# Patient Record
Sex: Female | Born: 1955
Health system: Southern US, Community
[De-identification: ages and names within clinical notes are randomized; demographics above are authoritative.]

## PROBLEM LIST (undated history)

## (undated) DIAGNOSIS — Z9889 Other specified postprocedural states: Secondary | ICD-10-CM

## (undated) DIAGNOSIS — M1990 Unspecified osteoarthritis, unspecified site: Secondary | ICD-10-CM

## (undated) DIAGNOSIS — Z87442 Personal history of urinary calculi: Secondary | ICD-10-CM

## (undated) DIAGNOSIS — C801 Malignant (primary) neoplasm, unspecified: Secondary | ICD-10-CM

## (undated) DIAGNOSIS — J449 Chronic obstructive pulmonary disease, unspecified: Secondary | ICD-10-CM

## (undated) DIAGNOSIS — I2510 Atherosclerotic heart disease of native coronary artery without angina pectoris: Secondary | ICD-10-CM

## (undated) DIAGNOSIS — I700 Atherosclerosis of aorta: Secondary | ICD-10-CM

## (undated) DIAGNOSIS — K529 Noninfective gastroenteritis and colitis, unspecified: Secondary | ICD-10-CM

## (undated) DIAGNOSIS — F329 Major depressive disorder, single episode, unspecified: Secondary | ICD-10-CM

## (undated) DIAGNOSIS — C439 Malignant melanoma of skin, unspecified: Secondary | ICD-10-CM

## (undated) DIAGNOSIS — G5792 Unspecified mononeuropathy of left lower limb: Secondary | ICD-10-CM

## (undated) DIAGNOSIS — I5189 Other ill-defined heart diseases: Secondary | ICD-10-CM

## (undated) DIAGNOSIS — K219 Gastro-esophageal reflux disease without esophagitis: Secondary | ICD-10-CM

## (undated) DIAGNOSIS — M5030 Other cervical disc degeneration, unspecified cervical region: Secondary | ICD-10-CM

## (undated) DIAGNOSIS — C319 Malignant neoplasm of accessory sinus, unspecified: Secondary | ICD-10-CM

## (undated) DIAGNOSIS — F32A Depression, unspecified: Secondary | ICD-10-CM

## (undated) DIAGNOSIS — Z7902 Long term (current) use of antithrombotics/antiplatelets: Secondary | ICD-10-CM

## (undated) DIAGNOSIS — I779 Disorder of arteries and arterioles, unspecified: Secondary | ICD-10-CM

## (undated) DIAGNOSIS — R112 Nausea with vomiting, unspecified: Secondary | ICD-10-CM

## (undated) DIAGNOSIS — K635 Polyp of colon: Secondary | ICD-10-CM

## (undated) DIAGNOSIS — Q2112 Patent foramen ovale: Secondary | ICD-10-CM

## (undated) DIAGNOSIS — G709 Myoneural disorder, unspecified: Secondary | ICD-10-CM

## (undated) DIAGNOSIS — M5136 Other intervertebral disc degeneration, lumbar region: Secondary | ICD-10-CM

## (undated) DIAGNOSIS — J189 Pneumonia, unspecified organism: Secondary | ICD-10-CM

## (undated) DIAGNOSIS — E785 Hyperlipidemia, unspecified: Secondary | ICD-10-CM

## (undated) DIAGNOSIS — C300 Malignant neoplasm of nasal cavity: Secondary | ICD-10-CM

## (undated) DIAGNOSIS — N200 Calculus of kidney: Secondary | ICD-10-CM

## (undated) DIAGNOSIS — I10 Essential (primary) hypertension: Secondary | ICD-10-CM

## (undated) DIAGNOSIS — K5790 Diverticulosis of intestine, part unspecified, without perforation or abscess without bleeding: Secondary | ICD-10-CM

## (undated) DIAGNOSIS — Z720 Tobacco use: Secondary | ICD-10-CM

## (undated) DIAGNOSIS — G629 Polyneuropathy, unspecified: Secondary | ICD-10-CM

## (undated) DIAGNOSIS — K589 Irritable bowel syndrome without diarrhea: Secondary | ICD-10-CM

## (undated) DIAGNOSIS — M47812 Spondylosis without myelopathy or radiculopathy, cervical region: Secondary | ICD-10-CM

## (undated) DIAGNOSIS — M4316 Spondylolisthesis, lumbar region: Secondary | ICD-10-CM

## (undated) DIAGNOSIS — G44009 Cluster headache syndrome, unspecified, not intractable: Secondary | ICD-10-CM

## (undated) DIAGNOSIS — M51369 Other intervertebral disc degeneration, lumbar region without mention of lumbar back pain or lower extremity pain: Secondary | ICD-10-CM

## (undated) DIAGNOSIS — R011 Cardiac murmur, unspecified: Secondary | ICD-10-CM

## (undated) HISTORY — PX: BACK SURGERY: SHX140

## (undated) HISTORY — PX: CATARACT EXTRACTION: SUR2

## (undated) HISTORY — DX: Unspecified osteoarthritis, unspecified site: M19.90

## (undated) HISTORY — DX: Irritable bowel syndrome, unspecified: K58.9

## (undated) HISTORY — PX: DILATION AND CURETTAGE OF UTERUS: SHX78

## (undated) HISTORY — PX: COLONOSCOPY: SHX174

## (undated) HISTORY — DX: Diverticulosis of intestine, part unspecified, without perforation or abscess without bleeding: K57.90

## (undated) HISTORY — PX: EYE SURGERY: SHX253

## (undated) HISTORY — DX: Tobacco use: Z72.0

## (undated) HISTORY — DX: Spondylosis without myelopathy or radiculopathy, cervical region: M47.812

## (undated) HISTORY — DX: Malignant neoplasm of nasal cavity: C30.0

## (undated) HISTORY — DX: Cluster headache syndrome, unspecified, not intractable: G44.009

## (undated) HISTORY — DX: Calculus of kidney: N20.0

## (undated) HISTORY — PX: OOPHORECTOMY: SHX86

## (undated) HISTORY — DX: Polyp of colon: K63.5

## (undated) HISTORY — DX: Essential (primary) hypertension: I10

## (undated) HISTORY — DX: Hyperlipidemia, unspecified: E78.5

## (undated) HISTORY — PX: CYSTOSCOPY: SUR368

## (undated) HISTORY — DX: Malignant neoplasm of accessory sinus, unspecified: C31.9

---
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
## 1993-10-26 DIAGNOSIS — C439 Malignant melanoma of skin, unspecified: Secondary | ICD-10-CM

## 1993-10-26 HISTORY — DX: Malignant melanoma of skin, unspecified: C43.9

## 1996-10-26 HISTORY — PX: ABDOMINAL HYSTERECTOMY: SHX81

## 1996-10-26 HISTORY — PX: APPENDECTOMY: SHX54

## 1999-10-27 HISTORY — PX: FRACTURE SURGERY: SHX138

## 1999-10-27 HISTORY — PX: OTHER SURGICAL HISTORY: SHX169

## 2000-05-02 ENCOUNTER — Encounter: Payer: Self-pay | Admitting: Emergency Medicine

## 2000-05-02 ENCOUNTER — Emergency Department (HOSPITAL_COMMUNITY): Admission: EM | Admit: 2000-05-02 | Discharge: 2000-05-02 | Payer: Self-pay | Admitting: Emergency Medicine

## 2000-05-03 ENCOUNTER — Encounter: Payer: Self-pay | Admitting: Emergency Medicine

## 2004-06-19 ENCOUNTER — Encounter: Admission: RE | Admit: 2004-06-19 | Discharge: 2004-06-19 | Payer: Self-pay | Admitting: Neurosurgery

## 2004-07-07 ENCOUNTER — Encounter: Admission: RE | Admit: 2004-07-07 | Discharge: 2004-07-07 | Payer: Self-pay | Admitting: Neurosurgery

## 2004-07-26 HISTORY — PX: LUMBAR SPINE SURGERY: SHX701

## 2004-08-25 ENCOUNTER — Ambulatory Visit (HOSPITAL_COMMUNITY): Admission: RE | Admit: 2004-08-25 | Discharge: 2004-08-25 | Payer: Self-pay | Admitting: Neurosurgery

## 2006-01-27 ENCOUNTER — Encounter: Admission: RE | Admit: 2006-01-27 | Discharge: 2006-01-27 | Payer: Self-pay | Admitting: Neurosurgery

## 2007-02-04 ENCOUNTER — Encounter: Payer: Self-pay | Admitting: Family Medicine

## 2007-02-04 LAB — CONVERTED CEMR LAB: Pap Smear: NORMAL

## 2007-06-02 ENCOUNTER — Ambulatory Visit: Payer: Self-pay | Admitting: Unknown Physician Specialty

## 2007-08-24 ENCOUNTER — Encounter: Payer: Self-pay | Admitting: Family Medicine

## 2007-09-14 ENCOUNTER — Ambulatory Visit: Payer: Self-pay | Admitting: Family Medicine

## 2007-09-14 DIAGNOSIS — M47812 Spondylosis without myelopathy or radiculopathy, cervical region: Secondary | ICD-10-CM

## 2007-09-14 DIAGNOSIS — J309 Allergic rhinitis, unspecified: Secondary | ICD-10-CM | POA: Insufficient documentation

## 2007-09-14 DIAGNOSIS — J45909 Unspecified asthma, uncomplicated: Secondary | ICD-10-CM

## 2007-09-14 DIAGNOSIS — J4520 Mild intermittent asthma, uncomplicated: Secondary | ICD-10-CM | POA: Insufficient documentation

## 2007-11-07 ENCOUNTER — Encounter: Payer: Self-pay | Admitting: Family Medicine

## 2007-12-01 ENCOUNTER — Encounter: Payer: Self-pay | Admitting: Family Medicine

## 2007-12-29 ENCOUNTER — Encounter: Payer: Self-pay | Admitting: Family Medicine

## 2008-01-10 ENCOUNTER — Ambulatory Visit: Payer: Self-pay | Admitting: Family Medicine

## 2008-01-10 DIAGNOSIS — F17200 Nicotine dependence, unspecified, uncomplicated: Secondary | ICD-10-CM | POA: Insufficient documentation

## 2008-07-05 ENCOUNTER — Encounter: Payer: Self-pay | Admitting: Family Medicine

## 2008-08-27 ENCOUNTER — Ambulatory Visit: Payer: Self-pay | Admitting: Family Medicine

## 2008-08-27 ENCOUNTER — Telehealth: Payer: Self-pay | Admitting: Family Medicine

## 2008-08-31 ENCOUNTER — Telehealth: Payer: Self-pay | Admitting: Family Medicine

## 2008-09-14 ENCOUNTER — Ambulatory Visit: Payer: Self-pay | Admitting: Family Medicine

## 2008-09-14 LAB — CONVERTED CEMR LAB
Glucose, Urine, Semiquant: NEGATIVE
Nitrite: NEGATIVE
Protein, U semiquant: NEGATIVE
WBC Urine, dipstick: NEGATIVE

## 2008-09-15 ENCOUNTER — Encounter: Payer: Self-pay | Admitting: Family Medicine

## 2008-10-26 HISTORY — PX: CERVICAL FUSION: SHX112

## 2008-12-21 ENCOUNTER — Encounter: Payer: Self-pay | Admitting: Family Medicine

## 2009-02-12 ENCOUNTER — Ambulatory Visit: Payer: Self-pay | Admitting: Family Medicine

## 2009-02-12 DIAGNOSIS — R030 Elevated blood-pressure reading, without diagnosis of hypertension: Secondary | ICD-10-CM

## 2009-02-12 DIAGNOSIS — L259 Unspecified contact dermatitis, unspecified cause: Secondary | ICD-10-CM

## 2009-03-04 ENCOUNTER — Encounter: Payer: Self-pay | Admitting: Family Medicine

## 2009-03-05 ENCOUNTER — Ambulatory Visit (HOSPITAL_COMMUNITY): Admission: RE | Admit: 2009-03-05 | Discharge: 2009-03-05 | Payer: Self-pay | Admitting: Neurosurgery

## 2009-03-08 ENCOUNTER — Ambulatory Visit: Payer: Self-pay | Admitting: Family Medicine

## 2009-03-12 ENCOUNTER — Ambulatory Visit: Payer: Self-pay | Admitting: Family Medicine

## 2009-03-13 ENCOUNTER — Encounter (INDEPENDENT_AMBULATORY_CARE_PROVIDER_SITE_OTHER): Payer: Self-pay | Admitting: Internal Medicine

## 2009-03-18 ENCOUNTER — Encounter: Payer: Self-pay | Admitting: Family Medicine

## 2009-03-22 ENCOUNTER — Encounter: Payer: Self-pay | Admitting: Family Medicine

## 2009-04-09 ENCOUNTER — Inpatient Hospital Stay (HOSPITAL_COMMUNITY): Admission: RE | Admit: 2009-04-09 | Discharge: 2009-04-10 | Payer: Self-pay | Admitting: Neurosurgery

## 2009-05-09 ENCOUNTER — Encounter: Payer: Self-pay | Admitting: Family Medicine

## 2009-06-17 ENCOUNTER — Encounter: Payer: Self-pay | Admitting: Family Medicine

## 2009-10-03 ENCOUNTER — Encounter: Payer: Self-pay | Admitting: Family Medicine

## 2009-12-29 LAB — HM PAP SMEAR: HM Pap smear: NORMAL

## 2010-10-26 HISTORY — PX: ESOPHAGOGASTRODUODENOSCOPY: SHX1529

## 2010-11-16 ENCOUNTER — Encounter: Payer: Self-pay | Admitting: Neurosurgery

## 2010-11-25 NOTE — Letter (Signed)
Summary: [facility]  [facility]   Imported By: Lanelle Bal 11/11/2009 09:04:34  _____________________________________________________________________  External Attachment:    Type:   Image     Comment:   External Document

## 2011-02-02 LAB — BASIC METABOLIC PANEL
Calcium: 9.6 mg/dL (ref 8.4–10.5)
Chloride: 106 mEq/L (ref 96–112)
Creatinine, Ser: 0.78 mg/dL (ref 0.4–1.2)
GFR calc non Af Amer: >60 mL/min (ref >60)
Potassium: 4.5 mEq/L (ref 3.5–5.1)
Sodium: 141 mEq/L (ref 135–145)

## 2011-02-02 LAB — CBC
Hemoglobin: 14.6 g/dL (ref 12.0–15.0)
MCHC: 33.5 g/dL (ref 30.0–36.0)
RBC: 4.62 MIL/uL (ref 3.87–5.11)
WBC: 7.5 10*3/uL (ref 4.0–10.5)

## 2011-03-10 NOTE — H&P (Signed)
NAME:  Heather Beasley, BLACKSON NO.:  [id number]   MEDICAL RECORD NO.:  [id number]          PATIENT TYPE:  INP   LOCATION:  3007                         FACILITY:  [facility]   PHYSICIAN:  Hilda Lias, M.D.   DATE OF BIRTH:  05/09/1956   DATE OF ADMISSION:  04/09/2009  DATE OF DISCHARGE:                              HISTORY & PHYSICAL   Heather Beasley is a lady who had been seen by me in my office for almost a  year complaining of neck pain with radiation to the upper extremity  which is not any better despite conservative treatment including  epidural injection.  X-rays were obtained.  She has had severe stenosis  from C3-4 down to C1-C6.  The patient came to see me on several  occasions and still she she felt that she is not any better and she want  to proceed with surgery.   PAST MEDICAL HISTORY:  Lumbar diskectomy 5 years ago.   She is allergic to ERYTHROMYCIN, AMOXICILLIN, AUGMENTIN, also to LIPIDS.   FAMILY HISTORY:  Positive for diabetes, high blood pressure.  Father  died of cancer of the pancreas.   SOCIAL HISTORY:  The patient does not drink.  She smokes a pack a day.   REVIEW OF SYSTEMS:  Positive for neck pain, back pain.   PHYSICAL EXAMINATION:  HEAD, EYE, EARS, NOSE, AND THROAT:  Normal.  NECK:  She has had decreased flexibility of the lumbar spine.  LUNGS:  Mild rhonchi bilaterally.  ABDOMEN:  Normal.  EXTREMITIES:  Normal pulses.  CARDIOVASCULAR:  Normal.  NEUROLOGIC:  She has weakness of the deltoid and biceps.  There has also  weakness of the left foot.  Reflexes 1+.  No Babinski.   Cervical spine x-rays showed that she has severe stenosis at the level  of 3-4, 4-5, 5-6.  Also, she has recurrent herniated disk at L4-5  lumbar.   IMPRESSION:  Cervical stenosis C3-4, 4-5, 5-6.  Recurrent herniated disk  in the lumbar area.  Rule out ulnar neuropathy.  My recommendation is  that the patient be admitted for surgery, and the procedure will be a 2-  level  anterior cervical diskectomy.  She knows about the risks of  infection, CSF leak, worsening pain, need for further surgery.  She is  fully aware that this type of surgery will not correct her recurrent  lumbar disease.            ______________________________  Hilda Lias, M.D.    EB/MEDQ  D:  04/09/2009  T:  04/10/2009  Job:  [id number]

## 2011-03-10 NOTE — Op Note (Signed)
NAME:  Heather Beasley, Heather Beasley               ACCOUNT NO.:  [id number]   MEDICAL RECORD NO.:  [id number]          PATIENT TYPE:  INP   LOCATION:  3007                         FACILITY:  [facility]   PHYSICIAN:  Hilda Lias, M.D.   DATE OF BIRTH:  June 18, 1956   DATE OF PROCEDURE:  04/09/2009  DATE OF DISCHARGE:                               OPERATIVE REPORT   PREOPERATIVE DIAGNOSIS:  C3-4, C4-5, and C5-6 spondylosis with chronic  radiculopathy.   POSTOPERATIVE DIAGNOSIS:  C3-4, C4-5, and C5-6 spondylosis with chronic  radiculopathy.   PROCEDURE:  T3-4, T4-5, and T5-6 diskectomy, decompression of spinal  cord, interbody fusion with auto and allograft plate, microscope.   SURGEON:  Hilda Lias, MD   ASSISTANT:  Cristi Loron, MD   CLINICAL HISTORY:  The patient was admitted because of neck pain  worsening at both upper extremity.  She had failed with conservative  treatment.  She had previous surgery by another neurosurgeon in the  lumbar area.  She is still complaining of back pain.  Surgery was  advised in the cervical area.  She was fully aware that this will not  take care of her lumbar problem.   PROCEDURE:  The patient was taken to the OR after intubation.  The neck  was cleaned with Hibiclens.  The patient is allergic to IODINE and  LATEX.  Then, a transverse incision was carried down through the skin,  subcutaneous tissue, and platysma down to cervical area.  X-ray showed  that indeed we were at the level of C4-5.  Then, anterior osteophyte was  removed and the anterior ligament at the level of C4-5 was opened.  With  a drill and microcurette, we did a total diskectomy.  We brought the  microscope into the area.  The posterior ligament was removed and quite  a bit of spondylosis with calcified posterior ligament was removed.  At  the end, we had good decompression of the spinal cord as well as C5  nerve root.  The same procedure with the same results was done at the  level of  C3-4 and C5-6 with good decompression bilaterally of the C4 and  C6 nerve root.  The patient had quite a bit of venous bleeding at the  level of C3-4 on the right side.  This was taken care with the Gelfoam.  Having good decompression, having good exposure of the spinal cord and  the nerve root, 3 pieces of bone graft, 7 mm height, with autograft and  BMX inside were inserted.  This was followed by a plate using 8 screws.  Lateral cervical spine showed good position with the plate and the bone  graft.  Although, we achieved hemostasis, nevertheless because she was a  little bit osteoporotic, we left the drain in the upper cervical area.  Then, the wound was closed with Vicryl and Steri-Strips.           ______________________________  Hilda Lias, M.D.     EB/MEDQ  D:  04/09/2009  T:  04/10/2009  Job:  [id number]

## 2011-03-13 NOTE — Op Note (Signed)
NAME:  Heather Beasley, Heather Beasley               ACCOUNT NO.:  [id number]   MEDICAL RECORD NO.:  [id number]          PATIENT TYPE:  OIB   LOCATION:  2899                         FACILITY:  [facility]   PHYSICIAN:  Kathaleen Maser. Pool, M.D.    DATE OF BIRTH:  1955/12/24   DATE OF PROCEDURE:  08/25/2004  DATE OF DISCHARGE:                                 OPERATIVE REPORT   PREOPERATIVE DIAGNOSIS:  Left L4-5 stenosis with L4-5 herniated nucleus  pulposus.   POSTOPERATIVE DIAGNOSIS:  Left L4-5 stenosis with L4-5 herniated nucleus  pulposus.   PROCEDURE:  Left L4-5 decompressive lumbar laminotomy with foraminotomy and  microdiskectomy.   SURGEON:  Kathaleen Maser. Pool, M.D.   ASSISTANT:  Reinaldo Meeker, M.D.   ANESTHESIA:  General endotracheal anesthesia.   INDICATIONS FOR PROCEDURE:  The patient is a 55 year old female with a  history of back and left lower extremity pain consistent with a left sided  L5 radiculopathy.  She has failed conservative management.  Workup has  demonstrated evidence of significant left sided lateral recess stenosis with  a superimposed disk herniation causing compression of the left sided L5  nerve root.  The patient has been counseled as to her options.  She desires  to proceed with a left sided L4-5 laminotomy and microdiskectomy in hopes of  improving her symptoms.   DESCRIPTION OF PROCEDURE:  The patient was brought to the operating room and  placed on the operating table in a supine position. After an adequate level  of anesthesia was achieved the patient was positioned prone onto a Wilson  frame and appropriately padded.  The region of the lumbar region was prepped  and draped sterilely.  A 10 blade was used to make a linear skin incision  overlying the L4-5 interspace.  This was carried down sharply in the  midline.  Subperiosteal dissection was performed which included the lamina  and facet joints of L4 and L5 on the left and deep self retaining retractor  was placed.   Intraoperative x-rays were taken and the level was confirmed.  Laminotomy was then performed using a high speed drill and Kerrison rongeurs  removing the inferior aspect of the lamina of L4 and the medial aspect of  the L5 pedicle and facet joint and the superior rim of the L5 lamina.  The  ligamentum flavum was then elevated and resected in the usual fashion using  Kerrison rongeurs freeing up the thecal sac and exiting the L5 nerve root  which were identified.  The microscope was brought onto the field and we  performed the microdissection of the left sided L5 nerve root and underlying  disk herniation. A wide foraminotomy was performed coursing the exiting L5  nerve root.  The thecal sac and L5 nerve root were then mobilized and  tracked towards the midline.  The epineural venous plexus was coagulated and  cut.  The disk herniation was readily apparent.  This was then incised with  a 15 blade in a rectangular fashion.  A wide disk space was then achieved  using pituitary rongeurs, __________ handle pituitary  rongeurs and Epstein  curettes.  All loose degenerative disk material was removed from the  interspace. All disk herniation was completely resected.  There was no  further compression on the thecal sac or nerve roots.  There was no evidence  of injury to the thecal sac or nerve roots.  The wound was then irrigated  with antibiotic flush.  Gelfoam was placed topically in the incision and  hemostasis was found to be good.  The operating microscope and retractors  were then removed.  Hemostasis was assured with  electrocautery.  The wounds were closed in layers with Vicryl sutures.  Steri-Strips and sterile dressing were applied.  There were no  complications.  The patient tolerated the procedure well and returns to the  recovery room postoperatively.       HAP/MEDQ  D:  08/25/2004  T:  08/25/2004  Job:  [id number]

## 2011-04-06 ENCOUNTER — Encounter: Payer: Self-pay | Admitting: Gastroenterology

## 2011-05-07 ENCOUNTER — Other Ambulatory Visit: Payer: Self-pay | Admitting: Neurosurgery

## 2011-05-07 DIAGNOSIS — M549 Dorsalgia, unspecified: Secondary | ICD-10-CM

## 2011-05-13 ENCOUNTER — Ambulatory Visit
Admission: RE | Admit: 2011-05-13 | Discharge: 2011-05-13 | Disposition: A | Discharge status: Home / Self Care | Payer: Self-pay | Source: Ambulatory Visit | Attending: Neurosurgery | Admitting: Neurosurgery

## 2011-05-13 VITALS — BP 89/49 | HR 62

## 2011-05-13 DIAGNOSIS — M549 Dorsalgia, unspecified: Secondary | ICD-10-CM

## 2011-05-13 MED ORDER — ONDANSETRON HCL 4 MG/2ML IJ SOLN
4.0000 mg | Freq: Once | INTRAMUSCULAR | Status: AC
  Administered 2011-05-13: 4 mg via INTRAMUSCULAR

## 2011-05-13 MED ORDER — DIAZEPAM 2 MG PO TABS
10.0000 mg | ORAL_TABLET | Freq: Once | ORAL | Status: AC
  Administered 2011-05-13: 10 mg via ORAL

## 2011-05-13 MED ORDER — DEXTROSE-NACL 5-0.45 % IV SOLN: INTRAVENOUS | Status: DC

## 2011-05-13 MED ORDER — MEPERIDINE HCL 100 MG/ML IJ SOLN
75.0000 mg | Freq: Once | INTRAMUSCULAR | Status: AC
  Administered 2011-05-13: 75 mg via INTRAMUSCULAR

## 2011-05-13 MED ORDER — IOHEXOL 180 MG/ML  SOLN
15.0000 mL | Freq: Once | INTRAMUSCULAR | Status: AC | PRN
  Administered 2011-05-13: 15 mL via INTRATHECAL

## 2011-05-13 NOTE — Progress Notes (Signed)
Pt in nursing station resting quietly w/o complaint after myelogram.  jkl

## 2011-05-28 ENCOUNTER — Ambulatory Visit: Payer: Self-pay | Admitting: Gastroenterology

## 2011-05-29 ENCOUNTER — Encounter (HOSPITAL_COMMUNITY)
Admission: RE | Admit: 2011-05-29 | Discharge: 2011-05-29 | Disposition: A | Discharge status: Home / Self Care | Payer: Medicare PPO | Source: Ambulatory Visit | Attending: Neurosurgery | Admitting: Neurosurgery

## 2011-05-29 LAB — BASIC METABOLIC PANEL
BUN: 10 mg/dL (ref 6–23)
Calcium: 9.7 mg/dL (ref 8.4–10.5)
GFR calc non Af Amer: >60 mL/min (ref >60)
Glucose, Bld: 84 mg/dL (ref 70–99)
Sodium: 141 mEq/L (ref 135–145)

## 2011-05-29 LAB — CBC
Hemoglobin: 14.9 g/dL (ref 12.0–15.0)
MCH: 31.2 pg (ref 26.0–34.0)
MCHC: 33.2 g/dL (ref 30.0–36.0)

## 2011-05-29 LAB — SURGICAL PCR SCREEN: MRSA, PCR: NEGATIVE

## 2011-06-09 ENCOUNTER — Inpatient Hospital Stay (HOSPITAL_COMMUNITY)
Admission: RE | Admit: 2011-06-09 | Discharge: 2011-06-14 | DRG: 460 | Disposition: A | Discharge status: Home / Self Care | Payer: Medicare PPO | Source: Ambulatory Visit | Attending: Neurosurgery | Admitting: Neurosurgery

## 2011-06-09 ENCOUNTER — Inpatient Hospital Stay (HOSPITAL_COMMUNITY): Payer: Medicare PPO

## 2011-06-09 ENCOUNTER — Other Ambulatory Visit (HOSPITAL_COMMUNITY): Payer: Self-pay | Admitting: Neurosurgery

## 2011-06-09 ENCOUNTER — Ambulatory Visit (HOSPITAL_COMMUNITY)
Admission: RE | Admit: 2011-06-09 | Discharge: 2011-06-09 | Disposition: A | Discharge status: Home / Self Care | Payer: Medicare PPO | Source: Ambulatory Visit | Attending: Neurosurgery | Admitting: Neurosurgery

## 2011-06-09 DIAGNOSIS — Z01811 Encounter for preprocedural respiratory examination: Secondary | ICD-10-CM

## 2011-06-09 DIAGNOSIS — J4489 Other specified chronic obstructive pulmonary disease: Secondary | ICD-10-CM | POA: Diagnosis present

## 2011-06-09 DIAGNOSIS — M5137 Other intervertebral disc degeneration, lumbosacral region: Principal | ICD-10-CM | POA: Diagnosis present

## 2011-06-09 DIAGNOSIS — J449 Chronic obstructive pulmonary disease, unspecified: Secondary | ICD-10-CM | POA: Diagnosis present

## 2011-06-09 DIAGNOSIS — Z91040 Latex allergy status: Secondary | ICD-10-CM

## 2011-06-09 DIAGNOSIS — F17200 Nicotine dependence, unspecified, uncomplicated: Secondary | ICD-10-CM | POA: Diagnosis present

## 2011-06-09 DIAGNOSIS — Z888 Allergy status to other drugs, medicaments and biological substances status: Secondary | ICD-10-CM

## 2011-06-09 DIAGNOSIS — M51379 Other intervertebral disc degeneration, lumbosacral region without mention of lumbar back pain or lower extremity pain: Principal | ICD-10-CM | POA: Diagnosis present

## 2011-06-09 DIAGNOSIS — M4310 Spondylolisthesis, site unspecified: Secondary | ICD-10-CM | POA: Diagnosis present

## 2011-06-09 DIAGNOSIS — K219 Gastro-esophageal reflux disease without esophagitis: Secondary | ICD-10-CM | POA: Diagnosis present

## 2011-06-09 LAB — CBC
HCT: 29.8 % — ABNORMAL LOW (ref 36.0–46.0)
MCHC: 32.6 g/dL (ref 30.0–36.0)
MCV: 95.8 fL (ref 78.0–100.0)
RDW: 13.3 % (ref 11.5–15.5)

## 2011-06-09 LAB — DIFFERENTIAL
Eosinophils Relative: 0 % (ref 0–5)
Lymphocytes Relative: 4 % — ABNORMAL LOW (ref 12–46)
Lymphs Abs: 0.4 10*3/uL — ABNORMAL LOW (ref 0.7–4.0)
Monocytes Absolute: 0.4 10*3/uL (ref 0.1–1.0)

## 2011-06-10 ENCOUNTER — Inpatient Hospital Stay (HOSPITAL_COMMUNITY): Payer: Medicare PPO

## 2011-06-10 DIAGNOSIS — R072 Precordial pain: Secondary | ICD-10-CM

## 2011-06-10 DIAGNOSIS — I959 Hypotension, unspecified: Secondary | ICD-10-CM

## 2011-06-10 DIAGNOSIS — D62 Acute posthemorrhagic anemia: Secondary | ICD-10-CM

## 2011-06-10 LAB — CBC
HCT: 24.9 % — ABNORMAL LOW (ref 36.0–46.0)
MCH: 31.2 pg (ref 26.0–34.0)
MCH: 31.7 pg (ref 26.0–34.0)
MCV: 95.8 fL (ref 78.0–100.0)
MCV: 94.4 fL (ref 78.0–100.0)
Platelets: 125 10*3/uL — ABNORMAL LOW (ref 150–400)
Platelets: 115 10*3/uL — ABNORMAL LOW (ref 150–400)
RBC: 2.6 MIL/uL — ABNORMAL LOW (ref 3.87–5.11)
RDW: 13.3 % (ref 11.5–15.5)
RDW: 14.4 % (ref 11.5–15.5)
WBC: 9.4 10*3/uL (ref 4.0–10.5)

## 2011-06-10 LAB — BASIC METABOLIC PANEL
CO2: 32 mEq/L (ref 19–32)
Calcium: 8 mg/dL — ABNORMAL LOW (ref 8.4–10.5)
Chloride: 104 mEq/L (ref 96–112)
Glucose, Bld: 112 mg/dL — ABNORMAL HIGH (ref 70–99)
Sodium: 138 mEq/L (ref 135–145)

## 2011-06-10 LAB — CARDIAC PANEL(CRET KIN+CKTOT+MB+TROPI)
CK, MB: 7.2 ng/mL (ref 0.3–4.0)
Total CK: 1419 U/L — ABNORMAL HIGH (ref 7–177)
Total CK: 1430 U/L — ABNORMAL HIGH (ref 7–177)
Troponin I: <0.3 ng/mL (ref <0.30)
Troponin I: <0.3 ng/mL (ref <0.30)

## 2011-06-10 LAB — TYPE AND SCREEN
Antibody Screen: NEGATIVE
Unit division: 0

## 2011-06-11 DIAGNOSIS — J189 Pneumonia, unspecified organism: Secondary | ICD-10-CM

## 2011-06-11 DIAGNOSIS — D62 Acute posthemorrhagic anemia: Secondary | ICD-10-CM

## 2011-06-11 DIAGNOSIS — I959 Hypotension, unspecified: Secondary | ICD-10-CM

## 2011-06-11 DIAGNOSIS — J411 Mucopurulent chronic bronchitis: Secondary | ICD-10-CM

## 2011-06-11 LAB — COMPREHENSIVE METABOLIC PANEL
BUN: 7 mg/dL (ref 6–23)
Calcium: 7.8 mg/dL — ABNORMAL LOW (ref 8.4–10.5)
GFR calc Af Amer: >60 mL/min (ref >60)
Glucose, Bld: 97 mg/dL (ref 70–99)
Total Protein: 4.4 g/dL — ABNORMAL LOW (ref 6.0–8.3)

## 2011-06-11 LAB — CBC
HCT: 27.4 % — ABNORMAL LOW (ref 36.0–46.0)
Hemoglobin: 8.9 g/dL — ABNORMAL LOW (ref 12.0–15.0)
MCH: 30.7 pg (ref 26.0–34.0)
MCHC: 32.5 g/dL (ref 30.0–36.0)

## 2011-06-11 LAB — PROCALCITONIN: Procalcitonin: 0.42 ng/mL

## 2011-06-11 LAB — CROSSMATCH: ABO/RH(D): A POS

## 2011-06-11 LAB — LACTIC ACID, PLASMA: Lactic Acid, Venous: 1 mmol/L (ref 0.5–2.2)

## 2011-06-12 ENCOUNTER — Inpatient Hospital Stay (HOSPITAL_COMMUNITY): Payer: Medicare PPO

## 2011-06-12 LAB — IRON AND TIBC

## 2011-06-12 LAB — CBC
Platelets: 125 10*3/uL — ABNORMAL LOW (ref 150–400)
RBC: 2.98 MIL/uL — ABNORMAL LOW (ref 3.87–5.11)
WBC: 8.2 10*3/uL (ref 4.0–10.5)

## 2011-06-12 LAB — BASIC METABOLIC PANEL
CO2: 30 mEq/L (ref 19–32)
Calcium: 8 mg/dL — ABNORMAL LOW (ref 8.4–10.5)
Chloride: 104 mEq/L (ref 96–112)
Sodium: 138 mEq/L (ref 135–145)

## 2011-06-12 LAB — FERRITIN: Ferritin: 330 ng/mL — ABNORMAL HIGH (ref 10–291)

## 2011-06-13 LAB — CBC
HCT: 27.5 % — ABNORMAL LOW (ref 36.0–46.0)
MCH: 30.7 pg (ref 26.0–34.0)
MCHC: 32.4 g/dL (ref 30.0–36.0)
MCV: 94.8 fL (ref 78.0–100.0)
RDW: 13.9 % (ref 11.5–15.5)

## 2011-06-13 LAB — BASIC METABOLIC PANEL
BUN: 5 mg/dL — ABNORMAL LOW (ref 6–23)
CO2: 31 mEq/L (ref 19–32)
Chloride: 105 mEq/L (ref 96–112)
Creatinine, Ser: 0.52 mg/dL (ref 0.50–1.10)
GFR calc Af Amer: >60 mL/min (ref >60)

## 2011-06-13 NOTE — H&P (Signed)
  NAME:  Heather Beasley, Heather Beasley NO.:  [id number]  MEDICAL RECORD NO.:  [id number]  LOCATION:                                 FACILITY:  PHYSICIAN:  Hilda Lias, M.D.   DATE OF BIRTH:  04/29/1956  DATE OF ADMISSION:  06/09/2011 DATE OF DISCHARGE:                             HISTORY & PHYSICAL   Heather Beasley is a lady who back in 2005 underwent bilateral laminectomy because of back pain with radiation to the left leg.  Nevertheless over the year, she continued to have more back pain.  She ended up having anterior cervical diskectomy by me.  She had been having more back pain lately.  She had been seen by the [facility].  She had conservative treatment including physical therapy and epidural injection and she is not any better.  We did the x-rays about 4 weeks ago and before the findings, she is being admitted for surgery.  PAST MEDICAL HISTORY: 1. Laminectomy in 2005. 2. Anterior cervical diskectomy.  ALLERGIES:  She is allergic to ERYTHROMYCIN, AMOXICILLIN, AUGMENTIN and now she is allergic to IODINE.  FAMILY HISTORY:  Positive for diabetes, high blood pressure.  Father died of cancer of the pancreas.  SOCIAL HISTORY:  The patient does not drink.  She smokes a pack a cigarette a day.  REVIEW OF SYSTEMS:  Positive for back pain and leg pain.  PHYSICAL EXAMINATION:  GENERAL:  The patient came to my office limping from the left leg. HEAD, EYE, EARS, NOSE, AND THROAT:  Normal. NECK:  There is a scar anteriorly. LUNGS:  There is some mild rhonchi bilaterally. CARDIOVASCULAR:  Normal. ABDOMEN:  Normal. EXTREMITIES:  No leg edema. NEUROLOGICAL:  She has a weakness and dorsiflexion in both feet, the left worse than right one.  Straight leg raising is positive bilaterally about 60 degrees.  Femoral stretch maneuver is negative bilaterally. Reflexes 1+.  No Babinski.  The lumbar myelogram showed that she has postoperative changes at the level of L4-5 with  recurrent herniated disk paracentral and to the left with bilateral foraminal narrow.  There is also grade 1 spondylolisthesis.  At the level L5-S1, there is a facet arthropathy with bilateral foraminal narrow.  CLINICAL IMPRESSION:  L4-5 spondylolisthesis, degenerative disk disease, L5-S1.  RECOMMENDATIONS:  The patient is being admitted for surgery.  Procedure will be bilateral L4-5, L5-S1 diskectomy, interbody fusion with cages and pedicle screws.  She and her family know the risk of the surgery including the possibility of pseudoarthrosis, infection, CSF leaks, no improve whatsoever, need for further surgery.          ______________________________ Hilda Lias, M.D.     EB/MEDQ  D:  06/09/2011  T:  06/09/2011  Job:  [id number]  Electronically Signed by Hilda Lias M.D. on 06/13/2011 09:44:01 AM

## 2011-06-13 NOTE — Op Note (Signed)
NAME:  Heather Beasley, PFARR NO.:  [id number]  MEDICAL RECORD NO.:  [id number]  LOCATION:  XRAY                         FACILITY:  [facility]  PHYSICIAN:  Hilda Lias, M.D.   DATE OF BIRTH:  1956/02/25  DATE OF PROCEDURE:  06/09/2011 DATE OF DISCHARGE:                              OPERATIVE REPORT   PREOPERATIVE DIAGNOSES:  Status post L5 laminectomy and diskectomy and degenerative disk disease with spondylolisthesis, L4-L5 and facet arthropathy, degenerative disk disease, L5-S1, and chronic radiculopathy.  POSTOPERATIVE DIAGNOSES:  Status post L5 laminectomy and diskectomy and degenerative disk disease with spondylolisthesis, L4-L5 and facet arthropathy, degenerative disk disease, L5-S1, and chronic radiculopathy.  PROCEDURES:  Bilateral L5 and L4 laminectomy, bilateral L4 and L5 facetectomy, bilateral L4-L5 diskectomy beyond what normally we do for routine diskectomy, lysis of adhesions, closure of opening the dura mater on the left dorsal L5 area.  Fusion with cages, two being at the level of L4-L5, one at the level of L5-S1.  Pedicle screws from L4, L5, and S1, Cross-Link posterolateral arthrodesis with Vitoss and autograft. Microscope.  SURGEON:  Hilda Lias, MD  ASSISTANT:  Stefani Dama, MD  CLINICAL HISTORY:  [name] is a 55 year old female who underwent surgery in the past by a different surgeon.  The patient at that time has diskectomy at L4-L5.  Since then, she had been complaining of back pain with radiation to both legs.  She has failed with conservative treatment.  X-rays shows spondylolisthesis at the level of L4-L5 with severe degenerative disk disease at the level of L5-S1.  Surgery was advised and the risk was fully explained to her.  PROCEDURE IN DETAIL:  The patient was taken to the OR and after intubation, the skin was cleaned with Hibiclens.  The patient allergic to iron and latex.  Drapes were applied.  Because of her size, we  were unable to feel any spinous process but the incision was made in the midline through the skin and subcutaneous tissue until we found the spinous process.  Then retraction was done laterally.  With x-ray, we were able to identify L4-L5 and L5-S1.  We proceeded with removal of spinous processes of L4 and L5.  We started first on the right side where the hemi lamina of the facet was removed.  At this area, the patient had quite a bit of adhesion and lysis was accomplished.  Then our attention was on the left side where it took [place] longer.  We had to go all the way laterally secondary to a large adhesion coming from the left side all the way down into the facet area.  At the end, we were able to remove the facet of L4 and L5.  There was 1-cm trapped in the dura mater which was taken care with 2 stitches of Prolene.  Then we started to dissect.  Dissection of the L4 and L5 and S1 nerve root on the right side was easy but the left one took [place] longer because of quite a bit of adhesion.  At the end, we were able to enter the disk space at the level of L4-L5 and first through the right approach and left  lateral approach, total diskectomy was done.  The endplates were removed and 2 cages of 10 x 22 with autograft were inserted.  At the level of L5-S1 after we did a diskectomy, we introduced one large single cage in the midline of 10 x 22.  The rest of the space was filled up with Vitoss and autograft.  Then using the C-arm first in AP view and then a lateral view, we probed the pedicle of L4, L5, and S1.  Prior to insertion of the screws, we filled the hole just to be sure that we were surrounded by bone.  Then 4 screws of 5.5 x 45 were introduced at the level of L4 and L5 and two of 5.5 x 50 were introduced at the level of S1.  The screws were kept in place with a rod and caps.  Then a cross-link from right to left was done.  We went laterally and we removed the periosteum of L3-L4, L4-L5, and  L5-S1 and a mix of Vitoss and autograft was used for arthrodesis.  Valsalva maneuver was negative.  Nevertheless, we used Tisseel in the epidural space to prevent CSF leak.  From then on, the area was irrigated and closed with Vicryl and staples.          ______________________________ Hilda Lias, M.D.     EB/MEDQ  D:  06/09/2011  T:  06/09/2011  Job:  [id number]  Electronically Signed by Hilda Lias M.D. on 06/13/2011 09:43:50 AM

## 2011-06-14 ENCOUNTER — Inpatient Hospital Stay (HOSPITAL_COMMUNITY): Payer: Medicare PPO

## 2011-06-14 DIAGNOSIS — J411 Mucopurulent chronic bronchitis: Secondary | ICD-10-CM

## 2011-06-14 DIAGNOSIS — J189 Pneumonia, unspecified organism: Secondary | ICD-10-CM

## 2011-06-17 LAB — CULTURE, BLOOD (ROUTINE X 2)
Culture: NO GROWTH
Culture: NO GROWTH

## 2011-06-23 NOTE — Discharge Summary (Signed)
  Heather Beasley, Heather Beasley NO.:  [id number]  MEDICAL RECORD NO.:  [id number]  LOCATION:  3037                         FACILITY:  [facility]  PHYSICIAN:  Hilda Lias, M.D.   DATE OF BIRTH:  02-13-1956  DATE OF ADMISSION:  06/09/2011 DATE OF DISCHARGE:  06/14/2011                              DISCHARGE SUMMARY   ADMISSION DIAGNOSES: 1. L4-5, L5-S1 degenerative disk disease. 2. Spondylolisthesis. 3. Chronic obstructive pulmonary disease.  FINAL DIAGNOSES: 1. L4-5, L5-S1 degenerative disk disease. 2. Spondylolisthesis. 3. Chronic obstructive pulmonary disease.  CLINICAL HISTORY:  The patient was admitted because of back pain radiation down to both legs.  This lady underwent surgery by different surgeons many years ago and by x-ray she had a case of degenerative disk disease and spondylolisthesis at the level of L4-5, L5-S1.  The patient had conservative treatment and because of no improvement, surgery was advised.  LABORATORY DATA:  They were normal on admission.  The patient developed pneumonia, which was taken care by the pulmonologist.  Today, the laboratory looks normal.  Chest x-rays shows mild atelectasis.  COURSE IN THE HOSPITAL:  The patient was taken to Surgery and 4-5 and 5- 1 diskectomy and fusion was done.  The patient has quite a scar tissue and we kept her flat in bed for 48 hours to avoid any CSF leak.  She was in the ICU.  She developed some chest pain.  X-ray showed pneumonia. She was treated by the pulmonologist.  She was transferred to the floor. She had been ambulating.  She had been seen by PT and OT.  Today, she was seen by the pulmonologist and they agreed with her to be discharged. Today, she is being discharged to go home and I will see her in 10 days.  CONDITION ON DISCHARGE:  Improvement.  MEDICATION:  She will take Percocet and Flexeril, also Levaquin 500 mg every day x2 days.  Regular activity, not to drive, not to do any  heavy lifting.  Follow up to see by me in 10 days.         ______________________________ Hilda Lias, M.D.    EB/MEDQ  D:  06/14/2011  T:  06/14/2011  Job:  [id number]  Electronically Signed by Hilda Lias M.D. on 06/23/2011 06:13:51 PM

## 2011-07-16 ENCOUNTER — Other Ambulatory Visit: Payer: Self-pay | Admitting: Unknown Physician Specialty

## 2011-07-28 ENCOUNTER — Ambulatory Visit: Payer: Self-pay | Admitting: Unknown Physician Specialty

## 2011-08-07 ENCOUNTER — Ambulatory Visit: Payer: Self-pay | Admitting: Unknown Physician Specialty

## 2011-08-27 ENCOUNTER — Ambulatory Visit: Payer: Self-pay | Admitting: Surgery

## 2011-08-27 HISTORY — PX: CHOLECYSTECTOMY: SHX55

## 2011-09-03 ENCOUNTER — Ambulatory Visit: Payer: Self-pay | Admitting: Surgery

## 2011-09-07 LAB — PATHOLOGY REPORT

## 2012-01-04 ENCOUNTER — Encounter: Payer: Self-pay | Admitting: Internal Medicine

## 2012-01-04 ENCOUNTER — Other Ambulatory Visit: Payer: Self-pay | Admitting: Internal Medicine

## 2012-01-04 ENCOUNTER — Encounter: Payer: Self-pay | Admitting: Neurology

## 2012-01-04 ENCOUNTER — Ambulatory Visit (INDEPENDENT_AMBULATORY_CARE_PROVIDER_SITE_OTHER): Payer: Medicare PPO | Admitting: Internal Medicine

## 2012-01-04 VITALS — BP 110/62 | HR 95 | Temp 98.5°F | Ht 65.5 in | Wt 173.0 lb

## 2012-01-04 DIAGNOSIS — F32A Depression, unspecified: Secondary | ICD-10-CM | POA: Insufficient documentation

## 2012-01-04 DIAGNOSIS — F17200 Nicotine dependence, unspecified, uncomplicated: Secondary | ICD-10-CM

## 2012-01-04 DIAGNOSIS — E785 Hyperlipidemia, unspecified: Secondary | ICD-10-CM

## 2012-01-04 DIAGNOSIS — R1013 Epigastric pain: Secondary | ICD-10-CM

## 2012-01-04 DIAGNOSIS — Z8669 Personal history of other diseases of the nervous system and sense organs: Secondary | ICD-10-CM

## 2012-01-04 DIAGNOSIS — M47812 Spondylosis without myelopathy or radiculopathy, cervical region: Secondary | ICD-10-CM

## 2012-01-04 DIAGNOSIS — E559 Vitamin D deficiency, unspecified: Secondary | ICD-10-CM | POA: Insufficient documentation

## 2012-01-04 DIAGNOSIS — F329 Major depressive disorder, single episode, unspecified: Secondary | ICD-10-CM

## 2012-01-04 LAB — COMPREHENSIVE METABOLIC PANEL
AST: 22 U/L (ref 0–37)
Albumin: 4.3 g/dL (ref 3.5–5.2)
BUN: 13 mg/dL (ref 6–23)
Calcium: 9.4 mg/dL (ref 8.4–10.5)
Chloride: 102 mEq/L (ref 96–112)
Creatinine, Ser: 0.7 mg/dL (ref 0.4–1.2)
Glucose, Bld: 75 mg/dL (ref 70–99)
Potassium: 4.9 mEq/L (ref 3.5–5.1)

## 2012-01-04 LAB — LIPID PANEL
Cholesterol: 185 mg/dL (ref 0–200)
HDL: 59.3 mg/dL (ref >39.00)
Total CHOL/HDL Ratio: 3
Triglycerides: 85 mg/dL (ref 0.0–149.0)

## 2012-01-04 MED ORDER — BUPROPION HCL ER (XL) 300 MG PO TB24: 300.0000 mg | ORAL_TABLET | Freq: Every day | ORAL | Status: AC

## 2012-01-04 NOTE — Assessment & Plan Note (Signed)
Patient has a family history of cerebellar ataxia. She is interested in genetic testing for this. Will set up referral to neurology for further evaluation.

## 2012-01-04 NOTE — Assessment & Plan Note (Signed)
Patient will continue to receive narcotic medications through her neurosurgeon. Will request records on recent evaluation.

## 2012-01-04 NOTE — Assessment & Plan Note (Signed)
Recent worsening. Will increase dose of Wellbutrin to 300 mg daily. Patient will followup in one month.

## 2012-01-04 NOTE — Assessment & Plan Note (Signed)
Strongly encourage smoking cessation today. Encouraged patient to try tapering down slowly in the number of cigarettes she smokes. Will also try increasing dose of Wellbutrin to see if this helps with anxiety and stress.

## 2012-01-04 NOTE — Assessment & Plan Note (Signed)
Will check vitamin D level with labs today. 

## 2012-01-04 NOTE — Progress Notes (Signed)
Subjective:    Patient ID: Heather Beasley, female    DOB: 03-10-1956, 56 y.o.   MRN: [id number]  HPI 56 year old female with history of degenerative disease of the spine and chronic back pain, depression, vitamin D deficiency, hyperlipidemia, and COPD presents to establish care. Her primary concern today is recent worsening of her anxiety and depressed mood. She notes that she is the primary caregiver for several relatives who are dying of cancer. She reports significant difficulty in caring for them and feels uncertain how to deal with her diagnosis. She has been taking Wellbutrin with minimal improvement in her symptoms. She notes that she uses cigarettes as a crutch for her anxiety. She is interested in changing the dose or type of her antidepressant medication.  She reports that she has chronic back pain, mostly in the cervical spine which is well controlled on current medications. She is followed by a neurosurgeon for chronic narcotics.  She reports that over the last several months she has had worsening of her GERD symptoms and midepigastric pain. She notes that she was evaluated in the past with ultrasound of her abdomen which was normal. She has a strong family history of pancreatic cancer. She has not had any nausea but has had frequent loose stools. She has been taking Protonix twice daily to help with epigastric pain with minimal improvement.  Outpatient Encounter Prescriptions as of 01/04/2012  Medication Sig Dispense Refill  . albuterol (PROVENTIL HFA;VENTOLIN HFA) 108 (90 BASE) MCG/ACT inhaler Inhale 2 puffs into the lungs every 6 (six) hours as needed.      Marland Kitchen atorvastatin (LIPITOR) 20 MG tablet Take 20 mg by mouth daily.      . cetirizine (ZYRTEC) 10 MG tablet Take 10 mg by mouth daily.      . cyclobenzaprine (FLEXERIL) 10 MG tablet Take 10 mg by mouth 3 (three) times daily as needed.      . gabapentin (NEURONTIN) 300 MG capsule Take 300 mg by mouth 3 (three) times daily.      Marland Kitchen  oxyCODONE-acetaminophen (PERCOCET) 7.5-325 MG per tablet Take 1 tablet by mouth every 6 (six) hours as needed.      . pantoprazole (PROTONIX) 40 MG tablet Take 40 mg by mouth 2 (two) times daily.      . Vitamin D, Ergocalciferol, (DRISDOL) 50000 UNITS CAPS Take 50,000 Units by mouth 2 (two) times a week.      Marland Kitchen buPROPion (WELLBUTRIN XL) 300 MG 24 hr tablet Take 1 tablet (300 mg total) by mouth daily.  30 tablet  6    Review of Systems  Constitutional: Negative for fever, chills, appetite change, fatigue and unexpected weight change.  HENT: Negative for ear pain, congestion, sore throat, trouble swallowing, neck pain, voice change and sinus pressure.   Eyes: Negative for visual disturbance.  Respiratory: Negative for cough, shortness of breath, wheezing and stridor.   Cardiovascular: Negative for chest pain, palpitations and leg swelling.  Gastrointestinal: Positive for abdominal pain and diarrhea. Negative for nausea, vomiting, constipation, blood in stool, abdominal distention and anal bleeding.  Genitourinary: Negative for dysuria and flank pain.  Musculoskeletal: Positive for myalgias, back pain and arthralgias. Negative for gait problem.  Skin: Negative for color change and rash.  Neurological: Negative for dizziness and headaches.  Hematological: Negative for adenopathy. Does not bruise/bleed easily.  Psychiatric/Behavioral: Positive for dysphoric mood. Negative for suicidal ideas and sleep disturbance. The patient is nervous/anxious.    BP 110/62  Pulse 95  Temp(Src) 98.5 F (  36.9 C) (Oral)  Ht 5' 5.5" (1.664 m)  Wt 173 lb (78.472 kg)  BMI 28.35 kg/m2  SpO2 96%     Objective:   Physical Exam  Constitutional: She is oriented to person, place, and time. She appears well-developed and well-nourished. No distress.  HENT:  Head: Normocephalic and atraumatic.  Right Ear: External ear normal.  Left Ear: External ear normal.  Nose: Nose normal.  Mouth/Throat: Oropharynx is clear  and moist. No oropharyngeal exudate.  Eyes: Conjunctivae are normal. Pupils are equal, round, and reactive to light. Right eye exhibits no discharge. Left eye exhibits no discharge. No scleral icterus.  Neck: Normal range of motion. Neck supple. No tracheal deviation present. No thyromegaly present.  Cardiovascular: Normal rate, regular rhythm, normal heart sounds and intact distal pulses.  Exam reveals no gallop and no friction rub.   No murmur heard. Pulmonary/Chest: Effort normal and breath sounds normal. No respiratory distress. She has no wheezes. She has no rales. She exhibits no tenderness.  Abdominal: Soft. Bowel sounds are normal. She exhibits no distension and no mass. There is tenderness (epigastric). There is no rebound and no guarding.  Musculoskeletal: Normal range of motion. She exhibits no edema and no tenderness.  Lymphadenopathy:    She has no cervical adenopathy.  Neurological: She is alert and oriented to person, place, and time. No cranial nerve deficit. She exhibits normal muscle tone. Coordination normal.  Skin: Skin is warm and dry. No rash noted. She is not diaphoretic. No erythema. No pallor.  Psychiatric: Her behavior is normal. Judgment and thought content normal. Her mood appears anxious. Cognition and memory are normal. She exhibits a depressed mood.          Assessment & Plan:

## 2012-01-04 NOTE — Assessment & Plan Note (Signed)
Symptoms are concerning for pancreatic etiology. Will check CMP and lipase today. Will get ultrasound of her abdomen including pancreas for further evaluation.

## 2012-01-04 NOTE — Assessment & Plan Note (Signed)
Will check lipids and LFTs with labs today. Continue Lipitor. 

## 2012-01-05 ENCOUNTER — Telehealth: Payer: Self-pay | Admitting: Internal Medicine

## 2012-01-05 MED ORDER — VITAMIN D (ERGOCALCIFEROL) 1.25 MG (50000 UNIT) PO CAPS: 50000.0000 [IU] | ORAL_CAPSULE | ORAL | Status: DC

## 2012-01-05 NOTE — Telephone Encounter (Signed)
[phone] Pt called checking on lab results

## 2012-01-05 NOTE — Telephone Encounter (Signed)
Pt has been off Vit D x 1 mth. She will restart and take med twice weekly x 3 mths then we will repeat labs.

## 2012-01-05 NOTE — Telephone Encounter (Signed)
Message copied by Vernie Murders on Tue Jan 05, 2012 10:02 AM ------      Message from: Ronna Polio A      Created: Mon Jan 04, 2012  5:17 PM       Labs were excellent. I wanted to add Lipase to labs if possible.

## 2012-01-06 ENCOUNTER — Ambulatory Visit: Payer: Self-pay | Admitting: Internal Medicine

## 2012-01-08 ENCOUNTER — Encounter: Payer: Self-pay | Admitting: Internal Medicine

## 2012-01-08 ENCOUNTER — Telehealth: Payer: Self-pay | Admitting: Internal Medicine

## 2012-01-08 DIAGNOSIS — D1771 Benign lipomatous neoplasm of kidney: Secondary | ICD-10-CM

## 2012-01-08 NOTE — Telephone Encounter (Signed)
Mychart message sent.

## 2012-01-08 NOTE — Telephone Encounter (Signed)
US abdomen was normal except for benign lesions (angiomyolipoma) seen in the kidneys.  Radiology recommended repeat US to check areas in kidney. I would like to set pt up with urology to have them review the [place] and make sure they agree with radiology.

## 2012-01-08 NOTE — Telephone Encounter (Signed)
[phone] pt called  Checking for ultra sound results.  Ultra sound was done Wednesday 3/13 in am

## 2012-01-08 NOTE — Telephone Encounter (Signed)
Mychart mess sent

## 2012-01-11 ENCOUNTER — Telehealth: Payer: Self-pay | Admitting: Internal Medicine

## 2012-01-11 NOTE — Telephone Encounter (Signed)
[facility] is scheduling for July and [facility] will not schedule for this patient as she has an outstanding balance.  Who else should I call to schedule

## 2012-01-11 NOTE — Telephone Encounter (Signed)
We can try [facility]

## 2012-01-12 ENCOUNTER — Encounter: Payer: Self-pay | Admitting: Internal Medicine

## 2012-01-13 ENCOUNTER — Telehealth: Payer: Self-pay | Admitting: Internal Medicine

## 2012-01-13 NOTE — Telephone Encounter (Signed)
I deactivated pt's MyChart Account and marked it as declined.

## 2012-01-13 NOTE — Telephone Encounter (Signed)
Please deactivate her my chart account . Patient no longer wants to be reached through this method of communication.

## 2012-02-10 ENCOUNTER — Ambulatory Visit: Payer: Medicare PPO | Admitting: Internal Medicine

## 2012-02-12 ENCOUNTER — Ambulatory Visit (INDEPENDENT_AMBULATORY_CARE_PROVIDER_SITE_OTHER): Payer: Medicare PPO | Admitting: Neurology

## 2012-02-12 ENCOUNTER — Encounter: Payer: Self-pay | Admitting: Neurology

## 2012-02-12 VITALS — BP 130/82 | HR 84 | Wt 173.0 lb

## 2012-02-12 DIAGNOSIS — R51 Headache: Secondary | ICD-10-CM

## 2012-02-12 DIAGNOSIS — G609 Hereditary and idiopathic neuropathy, unspecified: Secondary | ICD-10-CM

## 2012-02-12 MED ORDER — AMITRIPTYLINE HCL 25 MG PO TABS: ORAL_TABLET | ORAL | Status: DC

## 2012-02-12 NOTE — Progress Notes (Signed)
Dear 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  As you may recall, she is a 56 y.o. year old female with a history of cervical fusion and multiple lumbar spine surgeries presumably for spondylosis who presents with concerns about whether she may have symptoms of SCA as both her brother and female and female cousin were recently diagnosed with that condition.  She complains of intermittent head pain, that are occipital and throbbing in quality that are occuring about 2-3 times per week.  They are partially relieved with tylenol.  She says they started about 6 months after her neck fusion in June 2010.  She has a history migraine headaches, but has not had her typical frontal headaches in several years.  They seem to be worse with neck movement at times.  There is photophobia and infrequent nausea with these headaches.  In addition, she has developed symptoms of dysphagia with difficulty swallowing liquids or solids.  They seem to get hung up in her upper throat.  This was since her neck surgery, and has not progressed.  She does get vertigo at times, typically she tilts her head backwards.  It occurs multiple times per week and lasts seconds to minutes.  She did have a middle ear infection about 2 years ago, but it was not directly related to the vertigo.  Past Medical History  Diagnosis Date  . Asthma   . Diverticulosis   . Headaches, cluster   . Colon polyps   . IBS (irritable bowel syndrome)   . Allergic rhinitis   . Arthritis   . Cervical spondylosis   . Glaucoma   . Kidney stones     Past Surgical History  Procedure Date  . Appendectomy 1998  . Abdominal hysterectomy 1998  . Lumbar spine surgery 07/2004  . Vaginal delivery     x2  . Arm fx repair 2001  . Cervical fusion 2010  . Cholecystectomy 08/2011    Dr Renda Rolls  . Esophagogastroduodenoscopy 2012    Dr Markham Jordan    . Colonoscopy     Dr Markham Jordan  . Tonsillectomy 1968  . Cystoscopy     Dr. Lonna Cobb    History   Social History  . Marital Status: Married    Spouse Name: N/A    Number of Children: 2  . Years of Education: N/A   Occupational History  . Cashier    Social History Main Topics  . Smoking status: Current Everyday Smoker -- 0.8 packs/day    Types: Cigarettes  . Smokeless tobacco: Never Used  . Alcohol Use: No  . Drug Use: No  . Sexually Active: None   Other Topics Concern  . None   Social History Narrative   Lives in [place] with husband. Dog in home. Work - disabled for neck and back pain.    Family History  Problem Relation Age of Onset  . Hypertension Mother   . Hyperlipidemia Mother   . Diabetes Mother   . Colon cancer Father   . Pancreatic cancer Father   . Cancer Father     Colon & Pancreatic  . Breast cancer Paternal Grandmother   . Diabetes Paternal Grandmother   . Coronary artery disease Paternal Grandmother   . Heart failure Paternal Grandmother   . Cancer Paternal Grandmother     breast  . Stroke Brother     Current Outpatient Prescriptions on  File Prior to Visit  Medication Sig Dispense Refill  . albuterol (PROVENTIL HFA;VENTOLIN HFA) 108 (90 BASE) MCG/ACT inhaler Inhale 2 puffs into the lungs every 6 (six) hours as needed.      Marland Kitchen atorvastatin (LIPITOR) 20 MG tablet Take 20 mg by mouth daily.      Marland Kitchen buPROPion (WELLBUTRIN XL) 300 MG 24 hr tablet Take 1 tablet (300 mg total) by mouth daily.  30 tablet  6  . cetirizine (ZYRTEC) 10 MG tablet Take 10 mg by mouth daily.      . cyclobenzaprine (FLEXERIL) 10 MG tablet Take 10 mg by mouth 3 (three) times daily as needed.      . gabapentin (NEURONTIN) 300 MG capsule Take 300 mg by mouth 3 (three) times daily.      Marland Kitchen oxyCODONE-acetaminophen (PERCOCET) 7.5-325 MG per tablet Take 1 tablet by mouth every 6 (six) hours as needed.      . pantoprazole (PROTONIX) 40 MG tablet Take 40 mg by mouth 2 (two) times daily.       . Vitamin D, Ergocalciferol, (DRISDOL) 50000 UNITS CAPS Take 1 capsule (50,000 Units total) by mouth 2 (two) times a week.  24 capsule  0  . amitriptyline (ELAVIL) 25 MG tablet take 1/2 tab before bed for 1 week, and then increase to 1 tablet from then on  30 tablet  3    Allergies  Allergen Reactions  . Betadine (Povidone Iodine) Dermatitis and Rash    Topical betadine and iodine have this reaction with patient.  IV contrast is NOT a problem.  jkl  . Latex Dermatitis  . Amoxicillin     REACTION: rash  . Augmentin   . Carafate Nausea And Vomiting    Heart racing Lightheaded   . Chantix (Varenicline Tartrate)     Heart racing  . Erythromycin     REACTION: rash      ROS:  13 systems were reviewed and  are unremarkable.   Examination:  Filed Vitals:   02/12/12 1017  BP: 130/82  Pulse: 84  Weight: 173 lb (78.472 kg)     In general, well appearing women.  H&N: some occipital notch tenderness bilaterally, but no tinel's  Cardiovascular: The patient has a regular rate and rhythm and no carotid bruits.  Fundoscopy:  Disks are flat. Vessel caliber within normal limits.  Mental status:   The patient is oriented to person, place and time. Recent and remote memory are intact. Attention span and concentration are normal. Language including repetition, naming, following commands are intact. Fund of knowledge of current and historical events, as well as vocabulary are normal.  Cranial Nerves: Pupils are equally round and reactive to light. Visual fields full to confrontation. Reveal bilateral endgaze nystagmus, that is of higher amplitude on right gaze. Facial sensation decreased on the left to pin but muscles of mastication are intact. Muscles of facial expression are symmetric. Hearing intact to bilateral finger rub. Tongue protrusion, uvula, palate midline.  Shoulder shrug intact  Motor:  The patient has normal bulk except for wasting of the thenar muscles on the left ,  no pronator drift.  There are no adventitious movements.  5/5 muscle strength bilaterally except for left finger abduction weakness.  She does have some antalgia when testing her strength in the left arm and left leg, but they are full strength.   Biceps  Brachioradialis  Triceps  Patella  Ankles  Babinski   Right  3+  3+  3+  2+  2+  down   Left  2+  3+  3+  2+  0  down    Toes down  Coordination:  Normal finger to nose.  No dysdiadokinesia.  Sensation is decreased to pin in the arm on the left throughout, right ?C8.  Decreased pin in left leg distally compared to right.  R-S vibration 7 right, 5 left both upper and lower.  Gait and Station are normal.  Romberg is negative  D-H + for rotatory nystagmus with head down to the right.   Impression/Recs:   1.  ?SCA - I am not finding any evidence of spinocerebellar ataxia here.  I have asked her to get some additional information on her brother and cousin's diagnosis to further guide [place].  If she develops cerebellar symptoms we could get targetted genetic testing to rule out SCA. 2.  Headaches - likely migrainous with a contribution from her neck fusion - have recommeneded Elavil 25mg  qhs to start 3.  Vertigo - Right posterior BPPV - have sent her to vestibular rehab for canolith repositioning 4.  left sided sensory changes - likely from her neck and back.  I am going to get records from Dr. Cassandria Santee office for details of her surgery.     We will see the patient back in 2 months.  Thank you for having [place] see Heather Beasley in consultation.  Feel free to contact me with any questions.  Lupita Raider Modesto Charon, MD [facility], [place] [address] [place], [place] [address] Phone: [phone] Fax: [phone].

## 2012-02-18 ENCOUNTER — Ambulatory Visit: Payer: Medicare PPO | Admitting: Internal Medicine

## 2012-02-22 ENCOUNTER — Ambulatory Visit (INDEPENDENT_AMBULATORY_CARE_PROVIDER_SITE_OTHER): Payer: Medicare PPO | Admitting: Internal Medicine

## 2012-02-22 ENCOUNTER — Telehealth: Payer: Self-pay | Admitting: *Deleted

## 2012-02-22 ENCOUNTER — Encounter: Payer: Self-pay | Admitting: Internal Medicine

## 2012-02-22 VITALS — BP 108/76 | HR 76 | Temp 98.2°F | Resp 16 | Wt 172.5 lb

## 2012-02-22 DIAGNOSIS — R1013 Epigastric pain: Secondary | ICD-10-CM

## 2012-02-22 DIAGNOSIS — R51 Headache: Secondary | ICD-10-CM

## 2012-02-22 DIAGNOSIS — F329 Major depressive disorder, single episode, unspecified: Secondary | ICD-10-CM

## 2012-02-22 DIAGNOSIS — G8929 Other chronic pain: Secondary | ICD-10-CM | POA: Insufficient documentation

## 2012-02-22 DIAGNOSIS — D3000 Benign neoplasm of unspecified kidney: Secondary | ICD-10-CM

## 2012-02-22 DIAGNOSIS — K219 Gastro-esophageal reflux disease without esophagitis: Secondary | ICD-10-CM | POA: Insufficient documentation

## 2012-02-22 DIAGNOSIS — D179 Benign lipomatous neoplasm, unspecified: Secondary | ICD-10-CM | POA: Insufficient documentation

## 2012-02-22 DIAGNOSIS — R300 Dysuria: Secondary | ICD-10-CM | POA: Insufficient documentation

## 2012-02-22 LAB — POCT URINALYSIS DIPSTICK
Glucose, UA: NEGATIVE
Ketones, UA: NEGATIVE
Spec Grav, UA: 1.02
Urobilinogen, UA: 0.2

## 2012-02-22 MED ORDER — RANITIDINE HCL 150 MG PO TABS: 150.0000 mg | ORAL_TABLET | Freq: Two times a day (BID) | ORAL | Status: DC

## 2012-02-22 MED ORDER — CIPROFLOXACIN HCL 250 MG PO TABS: 250.0000 mg | ORAL_TABLET | Freq: Two times a day (BID) | ORAL | Status: AC

## 2012-02-22 NOTE — Assessment & Plan Note (Signed)
Patient was started on amitriptyline by neurologist. She is currently taking 12.5 mg daily. She has not noted any change in her headache pattern. Will try increasing dose to 25 mg nightly. She will call with update in symptoms. Followup 3 months.

## 2012-02-22 NOTE — Progress Notes (Signed)
Addended by: Jobie Quaker on: 02/22/2012 04:39 PM   Modules accepted: Orders

## 2012-02-22 NOTE — Telephone Encounter (Signed)
Rx sent to pharmacy; patient informed/SLS

## 2012-02-22 NOTE — Telephone Encounter (Signed)
Message copied by Regis Bill on Mon Feb 22, 2012  4:51 PM ------      Message from: Ronna Polio A      Created: Mon Feb 22, 2012  4:47 PM       There continues to be a small amount of blood in the urine. Please let pt know that we will send urine for culture. We should treat empirically with Cipro 250mg  po bid x 3 days (please confirm no allergy). Also would recommend that she keeps follow up with urology.

## 2012-02-22 NOTE — Assessment & Plan Note (Signed)
Noted on recent ultrasound of the kidney. Followup established with urology for July 2013.

## 2012-02-22 NOTE — Progress Notes (Signed)
Subjective:    Patient ID: Heather Beasley, female    DOB: 1955/12/27, 56 y.o.   MRN: [id number]  HPI 56 year old female with history of hyperlipidemia, chronic headaches, GERD presents for followup. At her last visit, there was a concern about worsening epigastric pain. She reports that symptoms have improved somewhat. She still has intermittent epigastric pain. She notes that this is primarily at the site of her previous laparoscopic surgery. The pain is not affected by Protonix. She does not have reflux symptoms without taste in her mouth for nausea. Recent ultrasound of the abdomen was unremarkable. She has not had change in bowel habits, blood in her stool, or change in appetite.  In regards to her history of depression, she reports some improvement with increased dose of Wellbutrin to 300 mg daily. She also notes that her neurologist recently started her on amitriptyline at bedtime to help with headaches and sleep. She has not noted an improvement with this but is currently taking 12.5 mg nightly and has not advance dose to 25 mg. She reports intermittent diffuse headaches which seem to be worsened with stress or anxiety.  She was also noted on recent abdominal ultrasound to have angiomyolipoma in her kidney. She was scheduled with urology for followup for this. She denies any blood in her urine or dysuria. She occasionally has symptoms of bladder spasm with urination.  Outpatient Encounter Prescriptions as of 02/22/2012  Medication Sig Dispense Refill  . albuterol (PROVENTIL HFA;VENTOLIN HFA) 108 (90 BASE) MCG/ACT inhaler Inhale 2 puffs into the lungs every 6 (six) hours as needed.      Marland Kitchen amitriptyline (ELAVIL) 25 MG tablet take 1/2 tab before bed for 1 week, and then increase to 1 tablet from then on  30 tablet  3  . atorvastatin (LIPITOR) 20 MG tablet Take 20 mg by mouth daily.      Marland Kitchen buPROPion (WELLBUTRIN XL) 300 MG 24 hr tablet Take 1 tablet (300 mg total) by mouth daily.  30 tablet  6  .  cetirizine (ZYRTEC) 10 MG tablet Take 10 mg by mouth daily as needed. Allergies.      . cyclobenzaprine (FLEXERIL) 10 MG tablet Take 10 mg by mouth 3 (three) times daily as needed.      . gabapentin (NEURONTIN) 300 MG capsule Take 300 mg by mouth 3 (three) times daily.      Marland Kitchen oxyCODONE-acetaminophen (PERCOCET) 7.5-325 MG per tablet Take 1 tablet by mouth every 6 (six) hours as needed.      . pantoprazole (PROTONIX) 40 MG tablet Take 40 mg by mouth 2 (two) times daily.      . Vitamin D, Ergocalciferol, (DRISDOL) 50000 UNITS CAPS Take 1 capsule (50,000 Units total) by mouth 2 (two) times a week.  24 capsule  0  . ranitidine (ZANTAC) 150 MG tablet Take 1 tablet (150 mg total) by mouth 2 (two) times daily.  30 tablet  3    Review of Systems  Constitutional: Negative for fever, chills, appetite change, fatigue and unexpected weight change.  HENT: Positive for nosebleeds, congestion and rhinorrhea. Negative for sore throat, mouth sores, trouble swallowing, neck pain and voice change.   Eyes: Negative for pain, discharge, redness and visual disturbance.  Respiratory: Negative for cough, chest tightness, shortness of breath, wheezing and stridor.   Cardiovascular: Negative for chest pain, palpitations and leg swelling.  Gastrointestinal: Positive for abdominal pain. Negative for nausea, vomiting, diarrhea, constipation, blood in stool, abdominal distention and anal bleeding.  Genitourinary: Negative  for dysuria and flank pain.  Musculoskeletal: Negative for myalgias, arthralgias and gait problem.  Skin: Negative for color change and rash.  Neurological: Negative for dizziness, weakness, light-headedness and headaches.  Hematological: Negative for adenopathy. Does not bruise/bleed easily.  Psychiatric/Behavioral: Positive for dysphoric mood. Negative for suicidal ideas and sleep disturbance. The patient is nervous/anxious.    BP 108/76  Pulse 76  Temp(Src) 98.2 F (36.8 C) (Oral)  Resp 16  Wt 172  lb 8 oz (78.245 kg)     Objective:   Physical Exam  Constitutional: She is oriented to person, place, and time. She appears well-developed and well-nourished. No distress.  HENT:  Head: Normocephalic and atraumatic.  Right Ear: External ear normal.  Left Ear: External ear normal.  Nose: Nose normal.  Mouth/Throat: Oropharynx is clear and moist. No oropharyngeal exudate.  Eyes: Conjunctivae are normal. Pupils are equal, round, and reactive to light. Right eye exhibits no discharge. Left eye exhibits no discharge. No scleral icterus.  Neck: Normal range of motion. Neck supple. No tracheal deviation present. No thyromegaly present.  Cardiovascular: Normal rate, regular rhythm, normal heart sounds and intact distal pulses.  Exam reveals no gallop and no friction rub.   No murmur heard. Pulmonary/Chest: Effort normal and breath sounds normal. No respiratory distress. She has no wheezes. She has no rales. She exhibits no tenderness.  Abdominal: Soft. She exhibits no distension. There is tenderness (epigastric area).  Musculoskeletal: Normal range of motion. She exhibits no edema and no tenderness.  Lymphadenopathy:    She has no cervical adenopathy.  Neurological: She is alert and oriented to person, place, and time. No cranial nerve deficit. She exhibits normal muscle tone. Coordination normal.  Skin: Skin is warm and dry. No rash noted. She is not diaphoretic. No erythema. No pallor.  Psychiatric: She has a normal mood and affect. Her behavior is normal. Judgment and thought content normal.          Assessment & Plan:

## 2012-02-22 NOTE — Assessment & Plan Note (Signed)
Patient reports some intermittent episodes of bladder spasm. Will send urinalysis and culture today.

## 2012-02-22 NOTE — Assessment & Plan Note (Signed)
Symptoms improved with increased dose of Wellbutrin. We'll plan to continue. Followup 3 months.

## 2012-02-22 NOTE — Assessment & Plan Note (Signed)
Symptoms are intermittent. Question if this may be related to recent cholecystectomy. Recent abdominal ultrasound was unremarkable. Patient reports recent endoscopy in October 2012 which was normal. Will get records on this. Will try adding Zantac to see if any improvement. Patient will followup in 3 months.

## 2012-02-22 NOTE — Assessment & Plan Note (Signed)
Suspect that upper epigastric pain may be partly secondary to GERD. Will get records on recent endoscopy. Will try adding Zantac to see if any improvement. Followup 3 months or sooner as needed.

## 2012-02-26 ENCOUNTER — Telehealth: Payer: Self-pay | Admitting: Internal Medicine

## 2012-02-26 NOTE — Telephone Encounter (Signed)
Patient notified that we do not have her culture results back in.

## 2012-02-26 NOTE — Telephone Encounter (Signed)
[phone] Pt called to check on urine culture that was done this week. On monday

## 2012-02-27 LAB — URINE CULTURE: Colony Count: 15000

## 2012-03-18 ENCOUNTER — Ambulatory Visit: Payer: Medicare PPO | Attending: Family Medicine | Admitting: Physical Therapy

## 2012-03-18 DIAGNOSIS — IMO0001 Reserved for inherently not codable concepts without codable children: Secondary | ICD-10-CM | POA: Insufficient documentation

## 2012-03-18 DIAGNOSIS — H8110 Benign paroxysmal vertigo, unspecified ear: Secondary | ICD-10-CM | POA: Insufficient documentation

## 2012-03-23 ENCOUNTER — Encounter: Payer: Medicare PPO | Admitting: Rehabilitative and Restorative Service Providers"

## 2012-04-01 ENCOUNTER — Ambulatory Visit: Payer: Medicare PPO | Attending: Family Medicine | Admitting: Physical Therapy

## 2012-04-01 DIAGNOSIS — H8110 Benign paroxysmal vertigo, unspecified ear: Secondary | ICD-10-CM | POA: Insufficient documentation

## 2012-04-01 DIAGNOSIS — IMO0001 Reserved for inherently not codable concepts without codable children: Secondary | ICD-10-CM | POA: Insufficient documentation

## 2012-04-06 ENCOUNTER — Ambulatory Visit: Payer: Medicare PPO | Admitting: Physical Therapy

## 2012-04-08 ENCOUNTER — Ambulatory Visit: Payer: Medicare PPO | Admitting: Physical Therapy

## 2012-04-13 ENCOUNTER — Ambulatory Visit: Payer: Medicare PPO | Admitting: Physical Therapy

## 2012-04-15 ENCOUNTER — Ambulatory Visit: Payer: Medicare PPO | Admitting: Physical Therapy

## 2012-04-20 ENCOUNTER — Encounter: Payer: Medicare PPO | Admitting: Physical Therapy

## 2012-04-20 ENCOUNTER — Ambulatory Visit: Payer: Medicare PPO | Admitting: Physical Therapy

## 2012-04-22 ENCOUNTER — Ambulatory Visit: Payer: Medicare PPO | Admitting: Physical Therapy

## 2012-05-13 ENCOUNTER — Encounter: Payer: Self-pay | Admitting: Neurology

## 2012-05-13 ENCOUNTER — Ambulatory Visit (INDEPENDENT_AMBULATORY_CARE_PROVIDER_SITE_OTHER): Payer: Medicare PPO | Admitting: Neurology

## 2012-05-13 VITALS — BP 122/80 | HR 84 | Ht 65.0 in | Wt 172.0 lb

## 2012-05-13 DIAGNOSIS — Z8669 Personal history of other diseases of the nervous system and sense organs: Secondary | ICD-10-CM

## 2012-05-13 NOTE — Progress Notes (Signed)
Dear 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 As you may recall, she is a 56 y.o. year old female with a history of cervical fusion and multiple lumbar spine surgeries presumably for spondylosis who presents with concerns about whether she may have symptoms of SCA as both her brother and female and female cousin were recently diagnosed with that condition.  She complains of intermittent head pain, that are occipital and throbbing in quality that are occuring about 2-3 times per week. They are partially relieved with tylenol. She says they started about 6 months after her neck fusion in June 2010. She has a history migraine headaches, but has not had her typical frontal headaches in several years. They seem to be worse with neck movement at times. There is photophobia and infrequent nausea with these headaches.  In addition, she has developed symptoms of dysphagia with difficulty swallowing liquids or solids. They seem to get hung up in her upper throat. This was since her neck surgery, and has not progressed.  She does get vertigo at times, typically she tilts her head backwards. It occurs multiple times per week and lasts seconds to minutes. She did have a middle ear infection about 2 years ago, but it was not directly related to the vertigo.  ---------------------------  When I last saw her I felt her vertigo was BPPV.  Vestibular therapy stopped her vertigo.  In addition, I did not feel there was any signs of SCA.  I started her on Elavil  25mg  for her headaches, which have attenuated somewhat, but she finds the Elavil makes her too tired at hire doses.  She notes today that she continues to drink multiple caffeinated drinks per day, and also using tylenol regularly for her headaches.     Medical history, social history, and family history were reviewed and have not changed since the last  clinic visit.  Current Outpatient Prescriptions on File Prior to Visit  Medication Sig Dispense Refill  . albuterol (PROVENTIL HFA;VENTOLIN HFA) 108 (90 BASE) MCG/ACT inhaler Inhale 2 puffs into the lungs every 6 (six) hours as needed.      Marland Kitchen amitriptyline (ELAVIL) 25 MG tablet take 1/2 tab before bed for 1 week, and then increase to 1 tablet from then on  30 tablet  3  . atorvastatin (LIPITOR) 20 MG tablet Take 20 mg by mouth daily.      Marland Kitchen buPROPion (WELLBUTRIN XL) 300 MG 24 hr tablet Take 1 tablet (300 mg total) by mouth daily.  30 tablet  6  . cetirizine (ZYRTEC) 10 MG tablet Take 10 mg by mouth daily as needed. Allergies.      . cyclobenzaprine (FLEXERIL) 10 MG tablet Take 10 mg by mouth 3 (three) times daily as needed.      . gabapentin (NEURONTIN) 300 MG capsule Take 300 mg by mouth 3 (three) times daily.      Marland Kitchen oxyCODONE-acetaminophen (PERCOCET) 7.5-325 MG per tablet Take 1 tablet by mouth every 6 (six) hours as needed.      . pantoprazole (PROTONIX) 40 MG tablet Take 40 mg by mouth 2 (two) times daily.      . ranitidine (ZANTAC) 150 MG tablet Take 1 tablet (150 mg total) by mouth 2 (two) times daily.  30 tablet  3  . Vitamin D, Ergocalciferol, (DRISDOL) 50000 UNITS CAPS Take 1 capsule (50,000 Units total) by mouth  2 (two) times a week.  24 capsule  0    Allergies  Allergen Reactions  . Betadine (Povidone Iodine) Dermatitis and Rash    Topical betadine and iodine have this reaction with patient.  IV contrast is NOT a problem.  jkl  . Latex Dermatitis  . Amoxicillin     REACTION: rash  . Amoxicillin-Pot Clavulanate   . Chantix (Varenicline Tartrate)     Heart racing  . Erythromycin     REACTION: rash  . Sucralfate Nausea And Vomiting    Heart racing Lightheaded     ROS:  13 systems were reviewed and are notable for chronic sensory loss in her left arm as well as left leg.  All other review of systems are unremarkable.  Exam: . Filed Vitals:   05/13/12 0923  BP: 122/80    Pulse: 84  Height: 5\' 5"  (1.651 m)  Weight: 172 lb (78.019 kg)    In general, well appearing women.    Cardiovascular:  The patient has a regular rate and rhythm and no carotid bruits.    Cranial Nerves:  Pupils are equally round and reactive to light. Visual fields full to confrontation. EOMI. Facial sensation decreased on the left to pin but muscles of mastication are intact. Muscles of facial expression are symmetric. Hearing intact to bilateral finger rub. Tongue protrusion, uvula, palate midline. Shoulder shrug intact   Motor: The patient has normal bulk except for wasting of the thenar muscles on the left , no pronator drift. There are no adventitious movements.   5/5 muscle strength bilaterally except for left finger abduction weakness. She does have some antalgia when testing her strength in the left arm and left leg, but they are full strength.    Biceps  Brachioradialis  Triceps  Patella  Ankles  Babinski   Right  3+  3+  3+  2+  2+  down   Left  2+  3+  3+  2+  0  down   Toes down  Coordination: Normal finger to nose. No dysdiadokinesia.   Sensation is decreased to pin in the arm on the left throughout, right ?C8. Decreased pin in left leg distally compared to right. R-S vibration 7 right, 5 left both upper and lower.   Gait and Station are normal. Romberg is negative   Impression/Recommendations:  1.  ?spinocerebellar ataxia - I don't think she is suffering from this at the current moment and the chance she has a diagnosis of this in the future is low.  If she begins to have signs of ataxia a consult with Dr. Lurena Joiner Tat, who is a movement disorders specialist would be in order. 2.  Vertigo - BPPV, better with vestibular therapy. 3.  Headaches - clearly medication overuse(Tylenol) may be a problem here as may caffeine overuse.  She wants to try to go stop these without steroids.  If she has significant worsening she may call for steroids.  If her headaches continue with  frequency after coming off the caffeine and Tylenol I would consider stopping the Elavil and trying Topamax and titrate up to an initial dose of 50mg  bid. 4.  Sensory changes - from previous cervical and lumbar spine surgery   Lupita Raider. Modesto Charon, MD [facility], [place]

## 2012-05-23 ENCOUNTER — Encounter: Payer: Self-pay | Admitting: Internal Medicine

## 2012-05-23 ENCOUNTER — Ambulatory Visit (INDEPENDENT_AMBULATORY_CARE_PROVIDER_SITE_OTHER): Payer: Medicare PPO | Admitting: Internal Medicine

## 2012-05-23 VITALS — BP 140/100 | HR 78 | Temp 98.7°F | Ht 65.5 in | Wt 172.5 lb

## 2012-05-23 DIAGNOSIS — E785 Hyperlipidemia, unspecified: Secondary | ICD-10-CM

## 2012-05-23 DIAGNOSIS — R1084 Generalized abdominal pain: Secondary | ICD-10-CM

## 2012-05-23 DIAGNOSIS — R51 Headache: Secondary | ICD-10-CM

## 2012-05-23 MED ORDER — PROPRANOLOL HCL ER 60 MG PO CP24: 60.0000 mg | ORAL_CAPSULE | Freq: Every day | ORAL | Status: DC

## 2012-05-23 MED ORDER — ATORVASTATIN CALCIUM 20 MG PO TABS: 20.0000 mg | ORAL_TABLET | Freq: Every day | ORAL | Status: DC

## 2012-05-23 NOTE — Assessment & Plan Note (Signed)
Patient with persistent headaches occurring on a daily basis. Slight improvement with Elavil. Will try adding propanolol. Patient is not able to take nonsteroidals because of history of acid reflux. She will continue to take Tylenol as needed for breakthrough. Followup one month.

## 2012-05-23 NOTE — Progress Notes (Signed)
Subjective:    Patient ID: Heather Beasley, female    DOB: Sep 30, 1956, 56 y.o.   MRN: [id number]  HPI 56 year old female with history of GERD and chronic headaches presents for followup. In regards to chronic headaches, she reports minimal improvement with use of Elavil. She continues to have headaches on a daily basis described as diffuse aching, rated 2-3/10 in intensity. Over the last couple of days her headaches have become more intense typically a 5/10, but notably this in the setting of stopping caffeinated beverages. She denies any visual changes or photo or phonophobia. She denies nausea or vomiting. She takes Tylenol with relief of her symptoms.  She is also concerned today about abdominal cramping and diarrhea which occur when she eats a large meal, typically meals that include fatty foods or dairy products. Notably, this first started to occur after her gallbladder was removed. She denies any fever, chills, nausea, vomiting, or persistent abdominal pain. She denies blood in her stool. She has not had weight loss.  Outpatient Encounter Prescriptions as of 05/23/2012  Medication Sig Dispense Refill  . albuterol (PROVENTIL HFA;VENTOLIN HFA) 108 (90 BASE) MCG/ACT inhaler Inhale 2 puffs into the lungs every 6 (six) hours as needed.      Marland Kitchen amitriptyline (ELAVIL) 25 MG tablet take 1/2 tab before bed for 1 week, and then increase to 1 tablet from then on  30 tablet  3  . atorvastatin (LIPITOR) 20 MG tablet Take 1 tablet (20 mg total) by mouth daily.  30 tablet  6  . buPROPion (WELLBUTRIN XL) 300 MG 24 hr tablet Take 1 tablet (300 mg total) by mouth daily.  30 tablet  6  . cetirizine (ZYRTEC) 10 MG tablet Take 10 mg by mouth daily as needed. Allergies.      . cyclobenzaprine (FLEXERIL) 10 MG tablet Take 10 mg by mouth 3 (three) times daily as needed.      . gabapentin (NEURONTIN) 300 MG capsule Take 300 mg by mouth 3 (three) times daily.      Marland Kitchen oxyCODONE-acetaminophen (PERCOCET) 7.5-325 MG per  tablet Take 1 tablet by mouth every 6 (six) hours as needed.      . pantoprazole (PROTONIX) 40 MG tablet Take 40 mg by mouth 2 (two) times daily.      . ranitidine (ZANTAC) 150 MG tablet Take 1 tablet (150 mg total) by mouth 2 (two) times daily.  30 tablet  3  . tiZANidine (ZANAFLEX) 4 MG capsule Take 4 mg by mouth at bedtime as needed.      Marland Kitchen DISCONTD: atorvastatin (LIPITOR) 20 MG tablet Take 20 mg by mouth daily.      . propranolol ER (INDERAL LA) 60 MG 24 hr capsule Take 1 capsule (60 mg total) by mouth daily.  30 capsule  3  . DISCONTD: Vitamin D, Ergocalciferol, (DRISDOL) 50000 UNITS CAPS Take 1 capsule (50,000 Units total) by mouth 2 (two) times a week.  24 capsule  0   BP 140/100  Pulse 78  Temp 98.7 F (37.1 C) (Oral)  Ht 5' 5.5" (1.664 m)  Wt 172 lb 8 oz (78.245 kg)  BMI 28.27 kg/m2  SpO2 98%  Review of Systems  Constitutional: Negative for fever, chills, appetite change, fatigue and unexpected weight change.  HENT: Negative for ear pain, congestion, sore throat, trouble swallowing, neck pain, voice change and sinus pressure.   Eyes: Negative for visual disturbance.  Respiratory: Negative for cough, shortness of breath, wheezing and stridor.   Cardiovascular: Negative  for chest pain, palpitations and leg swelling.  Gastrointestinal: Positive for abdominal pain and diarrhea. Negative for nausea, vomiting, constipation, blood in stool, abdominal distention and anal bleeding.  Genitourinary: Negative for dysuria and flank pain.  Musculoskeletal: Positive for myalgias, back pain and arthralgias. Negative for gait problem.  Skin: Negative for color change and rash.  Neurological: Positive for headaches. Negative for dizziness.  Hematological: Negative for adenopathy. Does not bruise/bleed easily.  Psychiatric/Behavioral: Negative for suicidal ideas, disturbed wake/sleep cycle and dysphoric mood. The patient is not nervous/anxious.        Objective:   Physical Exam    Constitutional: She is oriented to person, place, and time. She appears well-developed and well-nourished. No distress.  HENT:  Head: Normocephalic and atraumatic.  Right Ear: External ear normal.  Left Ear: External ear normal.  Nose: Nose normal.  Mouth/Throat: Oropharynx is clear and moist. No oropharyngeal exudate.  Eyes: Conjunctivae are normal. Pupils are equal, round, and reactive to light. Right eye exhibits no discharge. Left eye exhibits no discharge. No scleral icterus.  Neck: Normal range of motion. Neck supple. No tracheal deviation present. No thyromegaly present.  Cardiovascular: Normal rate, regular rhythm, normal heart sounds and intact distal pulses.  Exam reveals no gallop and no friction rub.   No murmur heard. Pulmonary/Chest: Effort normal and breath sounds normal. No respiratory distress. She has no wheezes. She has no rales. She exhibits no tenderness.  Musculoskeletal: Normal range of motion. She exhibits no edema and no tenderness.  Lymphadenopathy:    She has no cervical adenopathy.  Neurological: She is alert and oriented to person, place, and time. No cranial nerve deficit. She exhibits normal muscle tone. Coordination normal.  Skin: Skin is warm and dry. No rash noted. She is not diaphoretic. No erythema. No pallor.  Psychiatric: She has a normal mood and affect. Her behavior is normal. Judgment and thought content normal.          Assessment & Plan:

## 2012-05-23 NOTE — Assessment & Plan Note (Signed)
Patient complains of abdominal cramping after eating fatty foods and dairy products. Suspect this is related to her history of cholecystectomy. Will check for lactose intolerance as well with labs.

## 2012-06-03 ENCOUNTER — Other Ambulatory Visit: Payer: Self-pay | Admitting: Internal Medicine

## 2012-06-04 LAB — LACTOSE TOLERANCE TEST
Glucose: 118 mg/dL
Glucose: 69 mg/dL
Glucose: 92 mg/dL
Glucose: 99 mg/dL

## 2012-06-13 ENCOUNTER — Telehealth: Payer: Self-pay | Admitting: Internal Medicine

## 2012-06-13 NOTE — Telephone Encounter (Signed)
Patient had this done at [facility]. I have called and requested them.

## 2012-06-13 NOTE — Telephone Encounter (Signed)
Patient wants results on her lactos intolerance test.

## 2012-06-24 ENCOUNTER — Encounter: Payer: Self-pay | Admitting: Internal Medicine

## 2012-06-24 ENCOUNTER — Ambulatory Visit (INDEPENDENT_AMBULATORY_CARE_PROVIDER_SITE_OTHER): Payer: Medicare PPO | Admitting: Internal Medicine

## 2012-06-24 VITALS — BP 110/78 | HR 81 | Temp 98.7°F | Ht 65.5 in | Wt 172.2 lb

## 2012-06-24 DIAGNOSIS — R1084 Generalized abdominal pain: Secondary | ICD-10-CM

## 2012-06-24 DIAGNOSIS — G4730 Sleep apnea, unspecified: Secondary | ICD-10-CM | POA: Insufficient documentation

## 2012-06-24 DIAGNOSIS — R51 Headache: Secondary | ICD-10-CM

## 2012-06-24 DIAGNOSIS — R400 Somnolence: Secondary | ICD-10-CM | POA: Insufficient documentation

## 2012-06-24 DIAGNOSIS — G4710 Hypersomnia, unspecified: Secondary | ICD-10-CM

## 2012-06-24 NOTE — Assessment & Plan Note (Signed)
Patient describes snoring, episodes of apnea, and daytime somnolence which are characteristic of sleep apnea. We'll set up a sleep study for further evaluation.

## 2012-06-24 NOTE — Assessment & Plan Note (Signed)
Patient reports headaches are improved. She complains of fatigue with use of propanolol. Will stop propanolol and monitor. If symptoms of chronic headaches are recurrent, she will call or return to clinic.

## 2012-06-24 NOTE — Progress Notes (Signed)
Subjective:    Patient ID: Heather Beasley, female    DOB: 18-Jun-1956, 56 y.o.   MRN: [id number]  HPI 56 year old female with history of tobacco abuse, asthma, chronic back pain, anxiety, chronic abdominal cramping, and migraine headaches presents for followup. In regards to her headaches, we recently started her on propanolol. She reports that headaches are improved compared to previous, however she notes some increased fatigue and occasional lightheadedness with the use of propanolol. She would like to try stopping this medication.  She is also concerned today about the possibility of sleep apnea. She reports that her mother, brother, and sister have all been diagnosed with sleep apnea. She reports that she is often noted to be snoring and has episodic periods of apnea which wakes her from sleep. She also notes extreme fatigue during the day and morning headaches. She has never had a sleep study test.  At her last visit, we discussed intermittent episodes of abdominal cramping. She was sent for testing for lactose intolerance the results of this study are not yet available.  Outpatient Encounter Prescriptions as of 06/24/2012  Medication Sig Dispense Refill  . albuterol (PROVENTIL HFA;VENTOLIN HFA) 108 (90 BASE) MCG/ACT inhaler Inhale 2 puffs into the lungs every 6 (six) hours as needed.      Marland Kitchen amitriptyline (ELAVIL) 25 MG tablet Take 12.5 mg by mouth at bedtime.      Marland Kitchen atorvastatin (LIPITOR) 20 MG tablet Take 1 tablet (20 mg total) by mouth daily.  30 tablet  6  . buPROPion (WELLBUTRIN XL) 300 MG 24 hr tablet Take 1 tablet (300 mg total) by mouth daily.  30 tablet  6  . cetirizine (ZYRTEC) 10 MG tablet Take 10 mg by mouth daily as needed. Allergies.      . cyclobenzaprine (FLEXERIL) 10 MG tablet Take 10 mg by mouth daily.       Marland Kitchen gabapentin (NEURONTIN) 300 MG capsule Take 300 mg by mouth 3 (three) times daily.      Marland Kitchen oxyCODONE-acetaminophen (PERCOCET) 7.5-325 MG per tablet Take 1 tablet by  mouth every 6 (six) hours as needed.      . pantoprazole (PROTONIX) 40 MG tablet Take 40 mg by mouth 2 (two) times daily.      . ranitidine (ZANTAC) 150 MG tablet Take 1 tablet (150 mg total) by mouth 2 (two) times daily.  30 tablet  3  . tiZANidine (ZANAFLEX) 4 MG capsule Take 4 mg by mouth at bedtime as needed.      Marland Kitchen DISCONTD: amitriptyline (ELAVIL) 25 MG tablet take 1/2 tab before bed for 1 week, and then increase to 1 tablet from then on  30 tablet  3  . DISCONTD: propranolol ER (INDERAL LA) 60 MG 24 hr capsule Take 1 capsule (60 mg total) by mouth daily.  30 capsule  3   BP 110/78  Pulse 81  Temp 98.7 F (37.1 C) (Oral)  Ht 5' 5.5" (1.664 m)  Wt 172 lb 4 oz (78.132 kg)  BMI 28.23 kg/m2  SpO2 98%  Review of Systems  Constitutional: Positive for fatigue. Negative for fever, chills, appetite change and unexpected weight change.  HENT: Negative for ear pain, congestion, sore throat, trouble swallowing, neck pain, voice change and sinus pressure.   Eyes: Negative for visual disturbance.  Respiratory: Negative for cough, shortness of breath, wheezing and stridor.   Cardiovascular: Negative for chest pain, palpitations and leg swelling.  Gastrointestinal: Positive for abdominal pain. Negative for nausea, vomiting, diarrhea, constipation, blood  in stool, abdominal distention and anal bleeding.  Genitourinary: Negative for dysuria and flank pain.  Musculoskeletal: Negative for myalgias, arthralgias and gait problem.  Skin: Negative for color change and rash.  Neurological: Positive for light-headedness and headaches. Negative for dizziness.  Hematological: Negative for adenopathy. Does not bruise/bleed easily.  Psychiatric/Behavioral: Negative for suicidal ideas, disturbed wake/sleep cycle and dysphoric mood. The patient is not nervous/anxious.        Objective:   Physical Exam  Constitutional: She is oriented to person, place, and time. She appears well-developed and well-nourished.  No distress.  HENT:  Head: Normocephalic and atraumatic.  Right Ear: External ear normal.  Left Ear: External ear normal.  Nose: Nose normal.  Mouth/Throat: Oropharynx is clear and moist. No oropharyngeal exudate.  Eyes: Conjunctivae are normal. Pupils are equal, round, and reactive to light. Right eye exhibits no discharge. Left eye exhibits no discharge. No scleral icterus.  Neck: Normal range of motion. Neck supple. No tracheal deviation present. No thyromegaly present.  Cardiovascular: Normal rate, regular rhythm, normal heart sounds and intact distal pulses.  Exam reveals no gallop and no friction rub.   No murmur heard. Pulmonary/Chest: Effort normal and breath sounds normal. No respiratory distress. She has no wheezes. She has no rales. She exhibits no tenderness.  Abdominal: Soft. Bowel sounds are normal. She exhibits no distension. There is no tenderness.  Musculoskeletal: Normal range of motion. She exhibits no edema and no tenderness.  Lymphadenopathy:    She has no cervical adenopathy.  Neurological: She is alert and oriented to person, place, and time. No cranial nerve deficit. She exhibits normal muscle tone. Coordination normal.  Skin: Skin is warm and dry. No rash noted. She is not diaphoretic. No erythema. No pallor.  Psychiatric: She has a normal mood and affect. Her behavior is normal. Judgment and thought content normal.          Assessment & Plan:

## 2012-06-24 NOTE — Assessment & Plan Note (Signed)
Patient was sent for lactose intolerance testing at her previous visit. Records on this testing are not yet available. Will request records.

## 2012-07-06 ENCOUNTER — Ambulatory Visit: Payer: Self-pay | Admitting: Internal Medicine

## 2012-07-08 ENCOUNTER — Telehealth: Payer: Self-pay | Admitting: Internal Medicine

## 2012-07-08 NOTE — Telephone Encounter (Signed)
Sleep study was normal. 

## 2012-07-08 NOTE — Telephone Encounter (Signed)
Patient advised as instructed via telephone. 

## 2012-08-08 ENCOUNTER — Ambulatory Visit: Payer: Medicare PPO | Admitting: Internal Medicine

## 2012-08-25 ENCOUNTER — Ambulatory Visit (INDEPENDENT_AMBULATORY_CARE_PROVIDER_SITE_OTHER): Payer: Medicare PPO | Admitting: Internal Medicine

## 2012-08-25 ENCOUNTER — Encounter: Payer: Self-pay | Admitting: Internal Medicine

## 2012-08-25 VITALS — BP 110/90 | HR 68 | Temp 97.7°F | Ht 65.5 in | Wt 174.0 lb

## 2012-08-25 DIAGNOSIS — M47812 Spondylosis without myelopathy or radiculopathy, cervical region: Secondary | ICD-10-CM

## 2012-08-25 DIAGNOSIS — Z23 Encounter for immunization: Secondary | ICD-10-CM

## 2012-08-25 DIAGNOSIS — R51 Headache: Secondary | ICD-10-CM

## 2012-08-25 DIAGNOSIS — Z1239 Encounter for other screening for malignant neoplasm of breast: Secondary | ICD-10-CM

## 2012-08-25 DIAGNOSIS — G4730 Sleep apnea, unspecified: Secondary | ICD-10-CM

## 2012-08-25 MED ORDER — ELETRIPTAN HYDROBROMIDE 40 MG PO TABS: 40.0000 mg | ORAL_TABLET | ORAL | Status: DC | PRN

## 2012-08-25 NOTE — Progress Notes (Signed)
Subjective:    Patient ID: Heather Beasley, female    DOB: 1956/02/27, 56 y.o.   MRN: [id number]  HPI 56 year old female with history of chronic back pain secondary to cervical spondylosis and degenerative changes throughout her spine, hyperlipidemia, depression, chronic migraine headaches presents for followup. At her last visit, we scheduled sleep study for evaluation of sleep apnea. She is tearful and when describing this study today. She reports that they would not allow her to lie on her side and she was unable to have restful sleep. She feels that the study was inadequate. The study demonstrated no sleep apnea.  She is also tearful today describing ongoing issues with upper and lower back pain. She describes this as aching which does not radiate. She has been followed by neurosurgery but reports she is unable to afford co-pay for this visit. Her neurosurgeon has refused to fill her Percocet and she has been out of Percocet for 2 months. She reports that other medications such as nonsteroidals, Neurontin, muscle relaxants are not helpful. She feels that pain is gradually getting worse. She requests refill on Percocet today.  In regards to chronic headache pain, she reports almost daily headaches at base line for which she has been taking Elavil at night to help with this with minimal improvement. She has never tried medications such as Imitrex as an abortive measure for headaches. Headaches are described as located behind the eyes and throbbing on occasion. Days limit her ability to sleep. They're sometimes associated with photo or phonophobia. They are not associated with nausea.  Outpatient Encounter Prescriptions as of 08/25/2012  Medication Sig Dispense Refill  . albuterol (PROVENTIL HFA;VENTOLIN HFA) 108 (90 BASE) MCG/ACT inhaler Inhale 2 puffs into the lungs every 6 (six) hours as needed.      Marland Kitchen amitriptyline (ELAVIL) 25 MG tablet Take 12.5 mg by mouth at bedtime.      Marland Kitchen atorvastatin  (LIPITOR) 20 MG tablet Take 1 tablet (20 mg total) by mouth daily.  30 tablet  6  . buPROPion (WELLBUTRIN XL) 300 MG 24 hr tablet Take 1 tablet (300 mg total) by mouth daily.  30 tablet  6  . cetirizine (ZYRTEC) 10 MG tablet Take 10 mg by mouth daily as needed. Allergies.      . cyclobenzaprine (FLEXERIL) 10 MG tablet Take 10 mg by mouth daily.       Marland Kitchen gabapentin (NEURONTIN) 300 MG capsule Take 300 mg by mouth 3 (three) times daily.      . pantoprazole (PROTONIX) 40 MG tablet Take 40 mg by mouth 2 (two) times daily.      . ranitidine (ZANTAC) 150 MG tablet Take 1 tablet (150 mg total) by mouth 2 (two) times daily.  30 tablet  3  . tiZANidine (ZANAFLEX) 4 MG capsule Take 4 mg by mouth at bedtime as needed.      Marland Kitchen DISCONTD: oxyCODONE-acetaminophen (PERCOCET) 7.5-325 MG per tablet Take 1 tablet by mouth every 6 (six) hours as needed.      . eletriptan (RELPAX) 40 MG tablet One tablet by mouth at onset of headache. May repeat in 2 hours if headache persists or recurs. may repeat in 2 hours if necessary  10 tablet  0   BP 110/90  Pulse 68  Temp 97.7 F (36.5 C) (Oral)  Ht 5' 5.5" (1.664 m)  Wt 174 lb (78.926 kg)  BMI 28.51 kg/m2  SpO2 96%  Review of Systems  Constitutional: Negative for fever, chills, appetite change, fatigue  and unexpected weight change.  HENT: Negative for ear pain, congestion, sore throat, trouble swallowing, neck pain, voice change and sinus pressure.   Eyes: Negative for visual disturbance.  Respiratory: Negative for cough, shortness of breath, wheezing and stridor.   Cardiovascular: Negative for chest pain, palpitations and leg swelling.  Gastrointestinal: Negative for nausea, vomiting, abdominal pain, diarrhea, constipation, blood in stool, abdominal distention and anal bleeding.  Genitourinary: Negative for dysuria and flank pain.  Musculoskeletal: Positive for myalgias, back pain and arthralgias. Negative for gait problem.  Skin: Negative for color change and rash.    Neurological: Positive for headaches. Negative for dizziness.  Hematological: Negative for adenopathy. Does not bruise/bleed easily.  Psychiatric/Behavioral: Positive for dysphoric mood. Negative for suicidal ideas and disturbed wake/sleep cycle. The patient is not nervous/anxious.        Objective:   Physical Exam  Constitutional: She is oriented to person, place, and time. She appears well-developed and well-nourished. No distress.  HENT:  Head: Normocephalic and atraumatic.  Right Ear: External ear normal.  Left Ear: External ear normal.  Nose: Nose normal.  Mouth/Throat: Oropharynx is clear and moist. No oropharyngeal exudate.  Eyes: Conjunctivae normal are normal. Pupils are equal, round, and reactive to light. Right eye exhibits no discharge. Left eye exhibits no discharge. No scleral icterus.  Neck: Normal range of motion. Neck supple. No tracheal deviation present. No thyromegaly present.  Cardiovascular: Normal rate, regular rhythm, normal heart sounds and intact distal pulses.  Exam reveals no gallop and no friction rub.   No murmur heard. Pulmonary/Chest: Effort normal and breath sounds normal. No respiratory distress. She has no wheezes. She has no rales. She exhibits no tenderness.  Musculoskeletal: She exhibits no edema and no tenderness.       Thoracic back: She exhibits decreased range of motion, tenderness and pain.       Lumbar back: She exhibits decreased range of motion, tenderness and pain.  Lymphadenopathy:    She has no cervical adenopathy.  Neurological: She is alert and oriented to person, place, and time. No cranial nerve deficit. She exhibits normal muscle tone. Coordination normal.  Skin: Skin is warm and dry. No rash noted. She is not diaphoretic. No erythema. No pallor.  Psychiatric: Her speech is normal and behavior is normal. Judgment and thought content normal. Cognition and memory are normal. She exhibits a depressed mood.          Assessment &  Plan:

## 2012-08-25 NOTE — Assessment & Plan Note (Signed)
Patient with chronic headaches most consistent with migraines. Currently using Elavil at night with minimal improvement. Will try adding Relpax as an abortive agent for headache pain. Patient will call if symptoms are not improving. Consider referral to headache specialist if symptoms are persistent.

## 2012-08-25 NOTE — Assessment & Plan Note (Signed)
Patient with chronic upper and lower back pain from spondylosis and degenerative changes. She reports she is unable to afford visit to see her neurosurgeon. She requests Percocet refilled today. Per her report, Neurosurgeon will not refill this medication. We will set her up with local pain management clinic. In the interim, she will continue to use Flexeril or Zanaflex as needed for pain.

## 2012-08-25 NOTE — Assessment & Plan Note (Signed)
Patient is tearful today describing recent sleep study. She feels that the study was inadequate. Study results demonstrated no evidence of sleep apnea. She feels that this is incorrect. We discussed potential referral to sleep specialist. She would like to hold off on this for now.

## 2012-09-13 ENCOUNTER — Ambulatory Visit: Payer: Self-pay | Admitting: Internal Medicine

## 2012-09-23 ENCOUNTER — Encounter: Payer: Self-pay | Admitting: Internal Medicine

## 2012-11-02 ENCOUNTER — Ambulatory Visit: Payer: Self-pay | Admitting: Pain Medicine

## 2012-11-17 ENCOUNTER — Ambulatory Visit: Payer: Self-pay | Admitting: Pain Medicine

## 2012-11-18 ENCOUNTER — Ambulatory Visit: Payer: Self-pay | Admitting: Pain Medicine

## 2012-11-25 ENCOUNTER — Encounter: Payer: Self-pay | Admitting: Internal Medicine

## 2012-11-25 ENCOUNTER — Ambulatory Visit (INDEPENDENT_AMBULATORY_CARE_PROVIDER_SITE_OTHER): Payer: Medicare PPO | Admitting: Internal Medicine

## 2012-11-25 VITALS — BP 128/88 | HR 83 | Temp 97.9°F | Ht 65.25 in | Wt 171.0 lb

## 2012-11-25 DIAGNOSIS — M549 Dorsalgia, unspecified: Secondary | ICD-10-CM

## 2012-11-25 DIAGNOSIS — G8929 Other chronic pain: Secondary | ICD-10-CM | POA: Insufficient documentation

## 2012-11-25 DIAGNOSIS — F419 Anxiety disorder, unspecified: Secondary | ICD-10-CM | POA: Insufficient documentation

## 2012-11-25 DIAGNOSIS — E785 Hyperlipidemia, unspecified: Secondary | ICD-10-CM

## 2012-11-25 DIAGNOSIS — F411 Generalized anxiety disorder: Secondary | ICD-10-CM

## 2012-11-25 DIAGNOSIS — R030 Elevated blood-pressure reading, without diagnosis of hypertension: Secondary | ICD-10-CM

## 2012-11-25 DIAGNOSIS — R42 Dizziness and giddiness: Secondary | ICD-10-CM

## 2012-11-25 LAB — COMPREHENSIVE METABOLIC PANEL
ALT: 20 U/L (ref 0–35)
Albumin: 4 g/dL (ref 3.5–5.2)
Alkaline Phosphatase: 92 U/L (ref 39–117)
CO2: 30 mEq/L (ref 19–32)
Glucose, Bld: 83 mg/dL (ref 70–99)
Potassium: 4.8 mEq/L (ref 3.5–5.1)
Sodium: 139 mEq/L (ref 135–145)
Total Protein: 6.9 g/dL (ref 6.0–8.3)

## 2012-11-25 MED ORDER — ATORVASTATIN CALCIUM 20 MG PO TABS: 20.0000 mg | ORAL_TABLET | Freq: Every day | ORAL | Status: DC

## 2012-11-25 NOTE — Assessment & Plan Note (Signed)
Patient notes some recent episodes of lightheadedness after having MRI performed. Question whether she may have had reaction to dye for the study. Will check renal function with labs today. Will continue to monitor symptoms. Increase fluid intake. Pt will call if symptoms persistent.

## 2012-11-25 NOTE — Assessment & Plan Note (Signed)
Blood pressure initially elevated today however patient extremely anxious and reporting severe back pain. Blood pressure improved after sitting for a few moments. Will have patient continue to monitor at home. She will e-mail or call with readings.

## 2012-11-25 NOTE — Progress Notes (Signed)
Subjective:    Patient ID: Heather Beasley, female    DOB: 26-Oct-1956, 57 y.o.   MRN: [id number]  HPI 57 year old female with history of tobacco abuse, anxiety, chronic back pain, migraine headaches presents for followup. She reports significant anxiety recently with caring for her husband who recently had heart attack and was hospitalized. She is also providing care for her grandchildren which she finds stressful. She reports that she uses smoking as an outlet for her stress. She is not interested in quitting.  Regards to chronic back pain, she reports she has had extensive evaluation at pain clinic. This included MRI of her lumbar spine and EMG testing. She is also meeting with the clinical psychologist. She is scheduled to followup at the pain clinic in the next month. She continues to have aching pain in her lower and mid back which limit her quality of life. She also occasionally has pain and weakness in her left ankle. She notes some episodes of foot drop with her left foot. She was told that she had bulging disc in her lumbar spine causing nerve compression.  Outpatient Encounter Prescriptions as of 11/25/2012  Medication Sig Dispense Refill  . albuterol (PROVENTIL HFA;VENTOLIN HFA) 108 (90 BASE) MCG/ACT inhaler Inhale 2 puffs into the lungs every 6 (six) hours as needed.      Marland Kitchen amitriptyline (ELAVIL) 25 MG tablet Take 12.5 mg by mouth at bedtime.      Marland Kitchen atorvastatin (LIPITOR) 20 MG tablet Take 1 tablet (20 mg total) by mouth daily.  30 tablet  11  . buPROPion (WELLBUTRIN XL) 300 MG 24 hr tablet Take 1 tablet (300 mg total) by mouth daily.  30 tablet  6  . cetirizine (ZYRTEC) 10 MG tablet Take 10 mg by mouth daily as needed. Allergies.      . cyclobenzaprine (FLEXERIL) 10 MG tablet Take 10 mg by mouth daily.       Marland Kitchen eletriptan (RELPAX) 40 MG tablet One tablet by mouth at onset of headache. May repeat in 2 hours if headache persists or recurs. may repeat in 2 hours if necessary  10 tablet  0   . gabapentin (NEURONTIN) 300 MG capsule Take 300 mg by mouth 3 (three) times daily.      . pantoprazole (PROTONIX) 40 MG tablet Take 40 mg by mouth 2 (two) times daily.      . ranitidine (ZANTAC) 150 MG tablet Take 1 tablet (150 mg total) by mouth 2 (two) times daily.  30 tablet  3  . tiZANidine (ZANAFLEX) 4 MG capsule Take 4 mg by mouth at bedtime as needed.      . [DISCONTINUED] atorvastatin (LIPITOR) 20 MG tablet Take 1 tablet (20 mg total) by mouth daily.  30 tablet  6   BP 128/88  Pulse 83  Temp 97.9 F (36.6 C)  Ht 5' 5.25" (1.657 m)  Wt 171 lb (77.565 kg)  BMI 28.24 kg/m2  SpO2 95%  Review of Systems  Constitutional: Negative for fever, chills and unexpected weight change.  HENT: Negative for sore throat, trouble swallowing, neck pain, neck stiffness and voice change.   Eyes: Negative for pain, discharge, redness and visual disturbance.  Respiratory: Negative for cough, chest tightness, shortness of breath, wheezing and stridor.   Cardiovascular: Negative for chest pain, palpitations and leg swelling.  Musculoskeletal: Positive for myalgias, back pain and arthralgias.  Skin: Negative for color change and rash.  Neurological: Negative for dizziness, weakness, light-headedness and headaches.  Hematological: Negative for adenopathy.  Psychiatric/Behavioral: Positive for dysphoric mood. The patient is nervous/anxious.        Objective:   Physical Exam  Constitutional: She is oriented to person, place, and time. She appears well-developed and well-nourished. No distress.  HENT:  Head: Normocephalic and atraumatic.  Right Ear: External ear normal.  Left Ear: External ear normal.  Nose: Nose normal.  Mouth/Throat: Oropharynx is clear and moist. No oropharyngeal exudate.  Eyes: Conjunctivae normal are normal. Pupils are equal, round, and reactive to light. Right eye exhibits no discharge. Left eye exhibits no discharge. No scleral icterus.  Neck: Normal range of motion. Neck  supple. No tracheal deviation present. No thyromegaly present.  Cardiovascular: Normal rate, regular rhythm, normal heart sounds and intact distal pulses.  Exam reveals no gallop and no friction rub.   No murmur heard. Pulmonary/Chest: Effort normal and breath sounds normal. No respiratory distress. She has no wheezes. She has no rales. She exhibits no tenderness.  Musculoskeletal: She exhibits no edema and no tenderness.       Thoracic back: She exhibits decreased range of motion and pain.       Lumbar back: She exhibits decreased range of motion and pain.  Lymphadenopathy:    She has no cervical adenopathy.  Neurological: She is alert and oriented to person, place, and time. No cranial nerve deficit. She exhibits normal muscle tone. Coordination normal.  Skin: Skin is warm and dry. No rash noted. She is not diaphoretic. No erythema. No pallor.  Psychiatric: Her speech is normal and behavior is normal. Judgment and thought content normal. Her mood appears anxious. Cognition and memory are normal. She exhibits a depressed mood. She expresses no homicidal and no suicidal ideation. She expresses no suicidal plans and no homicidal plans.          Assessment & Plan:

## 2012-11-25 NOTE — Assessment & Plan Note (Addendum)
Significant anxiety related to ongoing medical issues and care of husband and grandchildren. Offered support today. Will continue Amitriptyline and wellbutrin. Follow up prn and in 4 weeks. If no improvement consider change SSRI.

## 2012-11-25 NOTE — Assessment & Plan Note (Signed)
Patient with chronic back pain. Currently undergoing evaluation with Dr. Marchia Bond in the pain clinic at [facility]. Will request records on recent MRI lumbar spine and EMG testing. Medication management per Dr. Marchia Bond.

## 2012-11-25 NOTE — Patient Instructions (Signed)
Please check blood pressure 1-2 times per week and call with readings.

## 2012-12-01 ENCOUNTER — Telehealth: Payer: Self-pay | Admitting: *Deleted

## 2012-12-01 NOTE — Telephone Encounter (Signed)
These are excellent. No changes in medications at this point.

## 2012-12-01 NOTE — Telephone Encounter (Signed)
Patient notified as instructed by telephone. 

## 2012-12-01 NOTE — Telephone Encounter (Signed)
Patient was asked to call back with BP readings this week. Patient states that her BP was 117/68 HR 70 Tuesday AM, BP was 123/76 HR 77 this morning.

## 2012-12-13 ENCOUNTER — Encounter: Payer: Self-pay | Admitting: Internal Medicine

## 2012-12-13 ENCOUNTER — Ambulatory Visit (INDEPENDENT_AMBULATORY_CARE_PROVIDER_SITE_OTHER): Payer: Medicare PPO | Admitting: Internal Medicine

## 2012-12-13 VITALS — BP 110/72 | HR 86 | Temp 98.6°F | Ht 65.25 in | Wt 170.8 lb

## 2012-12-13 DIAGNOSIS — J309 Allergic rhinitis, unspecified: Secondary | ICD-10-CM | POA: Insufficient documentation

## 2012-12-13 DIAGNOSIS — J0100 Acute maxillary sinusitis, unspecified: Secondary | ICD-10-CM | POA: Insufficient documentation

## 2012-12-13 MED ORDER — FLUTICASONE PROPIONATE 50 MCG/ACT NA SUSP: 2.0000 | Freq: Two times a day (BID) | NASAL | Status: DC

## 2012-12-13 MED ORDER — DOXYCYCLINE HYCLATE 100 MG PO TABS: 100.0000 mg | ORAL_TABLET | Freq: Two times a day (BID) | ORAL | Status: DC

## 2012-12-13 NOTE — Progress Notes (Signed)
Subjective:    Patient ID: Heather Beasley, female    DOB: [birth year], 57 y.o.   MRN: [id number]  HPI 57YO female presents for acute visit c/o 7 day h/o sinus pressure, purulent nasal drainage, facial pain under eyes. Symptoms began several weeks ago with intermittent sneezing and clear nasal drainage. Took Zyrtec with no improvement. No fever, chills. Occasional non-productive cough. No chest pain or dyspnea.  Outpatient Encounter Prescriptions as of 12/13/2012  Medication Sig Dispense Refill  . albuterol (PROVENTIL HFA;VENTOLIN HFA) 108 (90 BASE) MCG/ACT inhaler Inhale 2 puffs into the lungs every 6 (six) hours as needed.      Marland Kitchen atorvastatin (LIPITOR) 20 MG tablet Take 1 tablet (20 mg total) by mouth daily.  30 tablet  11  . buPROPion (WELLBUTRIN XL) 300 MG 24 hr tablet Take 1 tablet (300 mg total) by mouth daily.  30 tablet  6  . cetirizine (ZYRTEC) 10 MG tablet Take 10 mg by mouth daily as needed. Allergies.      . cyclobenzaprine (FLEXERIL) 10 MG tablet Take 10 mg by mouth 2 (two) times daily.       Marland Kitchen eletriptan (RELPAX) 40 MG tablet One tablet by mouth at onset of headache. May repeat in 2 hours if headache persists or recurs. may repeat in 2 hours if necessary  10 tablet  0  . gabapentin (NEURONTIN) 300 MG capsule Take 300 mg by mouth 3 (three) times daily.      . pantoprazole (PROTONIX) 40 MG tablet Take 40 mg by mouth 2 (two) times daily.      Marland Kitchen tiZANidine (ZANAFLEX) 4 MG capsule Take 4 mg by mouth at bedtime as needed.      Marland Kitchen amitriptyline (ELAVIL) 25 MG tablet Take 12.5 mg by mouth at bedtime.      Marland Kitchen doxycycline (VIBRA-TABS) 100 MG tablet Take 1 tablet (100 mg total) by mouth 2 (two) times daily.  20 tablet  0  . fluticasone (FLONASE) 50 MCG/ACT nasal spray Place 2 sprays into the nose 2 (two) times daily.  16 g  6  . ranitidine (ZANTAC) 150 MG tablet Take 1 tablet (150 mg total) by mouth 2 (two) times daily.  30 tablet  3   No facility-administered encounter medications on file as  of 12/13/2012.   BP 110/72  Pulse 86  Temp(Src) 98.6 F (37 C) (Oral)  Ht 5' 5.25" (1.657 m)  Wt 170 lb 12 oz (77.452 kg)  BMI 28.21 kg/m2  SpO2 98%  Review of Systems  Constitutional: Negative for fever, chills and unexpected weight change.  HENT: Positive for congestion, rhinorrhea, sneezing, postnasal drip and sinus pressure. Negative for hearing loss, ear pain, nosebleeds, sore throat, facial swelling, mouth sores, trouble swallowing, neck pain, neck stiffness, voice change, tinnitus and ear discharge.   Eyes: Negative for pain, discharge, redness and visual disturbance.  Respiratory: Positive for cough. Negative for chest tightness, shortness of breath, wheezing and stridor.   Cardiovascular: Negative for chest pain, palpitations and leg swelling.  Musculoskeletal: Negative for myalgias and arthralgias.  Skin: Negative for color change and rash.  Neurological: Negative for dizziness, weakness, light-headedness and headaches.  Hematological: Negative for adenopathy.       Objective:   Physical Exam  Constitutional: She is oriented to person, place, and time. She appears well-developed and well-nourished. No distress.  HENT:  Head: Normocephalic and atraumatic.  Right Ear: External ear normal. Tympanic membrane is not bulging. A middle ear effusion is present.  Left Ear:  External ear normal. Tympanic membrane is not bulging. A middle ear effusion is present.  Nose: Mucosal edema present. Right sinus exhibits maxillary sinus tenderness. Left sinus exhibits maxillary sinus tenderness.  Mouth/Throat: Oropharynx is clear and moist. No oropharyngeal exudate.  Eyes: Conjunctivae are normal. Pupils are equal, round, and reactive to light. Right eye exhibits no discharge. Left eye exhibits no discharge. No scleral icterus.  Neck: Normal range of motion. Neck supple. No tracheal deviation present. No thyromegaly present.  Cardiovascular: Normal rate, regular rhythm, normal heart sounds  and intact distal pulses.  Exam reveals no gallop and no friction rub.   No murmur heard. Pulmonary/Chest: Effort normal and breath sounds normal. No accessory muscle usage. Not tachypneic. No respiratory distress. She has no wheezes. She has no rhonchi. She has no rales. She exhibits no tenderness.  Musculoskeletal: Normal range of motion. She exhibits no edema and no tenderness.  Lymphadenopathy:    She has no cervical adenopathy.  Neurological: She is alert and oriented to person, place, and time. No cranial nerve deficit. She exhibits normal muscle tone. Coordination normal.  Skin: Skin is warm and dry. No rash noted. She is not diaphoretic. No erythema. No pallor.  Psychiatric: She has a normal mood and affect. Her behavior is normal. Judgment and thought content normal.          Assessment & Plan:

## 2012-12-13 NOTE — Assessment & Plan Note (Signed)
Symptoms and exam consistent with acute maxillary sinusitis. Will treat with doxycycline and fluticasone. Pt will call if symptoms not improving over next 72hr.

## 2012-12-13 NOTE — Assessment & Plan Note (Signed)
Symptoms consistent with allergic rhinitis as trigger for current sinus infection. Will continue Claritin or Zyrtec daily and add Fluticasone nasal spray.

## 2012-12-28 ENCOUNTER — Ambulatory Visit: Payer: Self-pay | Admitting: Pain Medicine

## 2012-12-29 ENCOUNTER — Encounter: Payer: Self-pay | Admitting: Internal Medicine

## 2012-12-29 ENCOUNTER — Ambulatory Visit (INDEPENDENT_AMBULATORY_CARE_PROVIDER_SITE_OTHER): Payer: Medicare PPO | Admitting: Internal Medicine

## 2012-12-29 VITALS — BP 112/84 | HR 75 | Temp 98.5°F | Wt 172.0 lb

## 2012-12-29 DIAGNOSIS — E559 Vitamin D deficiency, unspecified: Secondary | ICD-10-CM

## 2012-12-29 DIAGNOSIS — J0100 Acute maxillary sinusitis, unspecified: Secondary | ICD-10-CM

## 2012-12-29 DIAGNOSIS — G8929 Other chronic pain: Secondary | ICD-10-CM

## 2012-12-29 DIAGNOSIS — M549 Dorsalgia, unspecified: Secondary | ICD-10-CM

## 2012-12-29 NOTE — Assessment & Plan Note (Signed)
Recently seen by pain clinic, titrating up on Gabapentin.  Tolerating well. Will continue to monitor. Insurance request in place for epidural steroid injection to help with symptoms.

## 2012-12-29 NOTE — Progress Notes (Signed)
Subjective:    Patient ID: Heather Beasley, female    DOB: September 06, 1956, 57 y.o.   MRN: [id number]  HPI 57YO female with h/o chronic back pain, tobacco abuse, hyperlipidemia presents for follow up.  Chronic back pain - Secondary to bone spurring and DJD in cervical and lumbar spine. Chronic aching pain which limits QOL. Recently seen at [facility] pain clinic. Symptoms improved with titration of dose of Gabapentin up, goal 3600mg  per day. Pt tolerating well with no drowsiness at present.  Plans for epidural steroid injection once approved by insurance company.  Vit D def - Recently noted to have low Vit D, 19, by pain management. On high dose vit D supplementation. No side effects noted.  Sinusitis - Recent infection has improved with decreased congestion and sinus pain. Continues to have some ear pressure. No fever, chills, cough.  Outpatient Encounter Prescriptions as of 12/29/2012  Medication Sig Dispense Refill  . albuterol (PROVENTIL HFA;VENTOLIN HFA) 108 (90 BASE) MCG/ACT inhaler Inhale 2 puffs into the lungs every 6 (six) hours as needed.      Marland Kitchen amitriptyline (ELAVIL) 25 MG tablet Take 12.5 mg by mouth at bedtime.      Marland Kitchen atorvastatin (LIPITOR) 20 MG tablet Take 1 tablet (20 mg total) by mouth daily.  30 tablet  11  . buPROPion (WELLBUTRIN XL) 300 MG 24 hr tablet Take 1 tablet (300 mg total) by mouth daily.  30 tablet  6  . cetirizine (ZYRTEC) 10 MG tablet Take 10 mg by mouth daily as needed. Allergies.      . Cholecalciferol (VITAMIN D3) 3000 UNITS TABS Take by mouth daily.      . cyclobenzaprine (FLEXERIL) 10 MG tablet Take 10 mg by mouth 2 (two) times daily.       Marland Kitchen eletriptan (RELPAX) 40 MG tablet One tablet by mouth at onset of headache. May repeat in 2 hours if headache persists or recurs. may repeat in 2 hours if necessary  10 tablet  0  . ergocalciferol (VITAMIN D2) 50000 UNITS capsule Take 50,000 Units by mouth 2 (two) times a week.      . fluticasone (FLONASE) 50 MCG/ACT nasal spray  Place 2 sprays into the nose 2 (two) times daily.  16 g  6  . gabapentin (NEURONTIN) 300 MG capsule Take 300 mg by mouth 3 (three) times daily. Dr. Laban Emperor adjusted her dose to the following: Start taking 1 pill 4 times a day for 4 then increase to 2 pills in the morning and 3 times for the remainder of the day for 4 days, then 2 pills for breakfast, 2 pills for lunch then 2 more times that day for 4 days until she is taking 3 pills four times a day.      . pantoprazole (PROTONIX) 40 MG tablet Take 40 mg by mouth 2 (two) times daily.      Marland Kitchen tiZANidine (ZANAFLEX) 4 MG capsule Take 4 mg by mouth at bedtime as needed.      . doxycycline (VIBRA-TABS) 100 MG tablet Take 1 tablet (100 mg total) by mouth 2 (two) times daily.  20 tablet  0  . ranitidine (ZANTAC) 150 MG tablet Take 1 tablet (150 mg total) by mouth 2 (two) times daily.  30 tablet  3   No facility-administered encounter medications on file as of 12/29/2012.   BP 112/84  Pulse 75  Temp(Src) 98.5 F (36.9 C) (Oral)  Wt 172 lb (78.019 kg)  BMI 28.42 kg/m2  SpO2 95%  Review of Systems  Constitutional: Negative for fever, chills, appetite change, fatigue and unexpected weight change.  HENT: Positive for ear pain (fullness). Negative for congestion, sore throat, trouble swallowing, neck pain, voice change and sinus pressure.   Eyes: Negative for visual disturbance.  Respiratory: Negative for cough, shortness of breath, wheezing and stridor.   Cardiovascular: Negative for chest pain, palpitations and leg swelling.  Gastrointestinal: Negative for nausea, vomiting, abdominal pain, diarrhea, constipation, blood in stool, abdominal distention and anal bleeding.  Genitourinary: Negative for dysuria and flank pain.  Musculoskeletal: Positive for back pain and arthralgias. Negative for myalgias and gait problem.  Skin: Negative for color change and rash.  Neurological: Negative for dizziness and headaches.  Hematological: Negative for adenopathy.  Does not bruise/bleed easily.  Psychiatric/Behavioral: Negative for suicidal ideas, sleep disturbance and dysphoric mood. The patient is not nervous/anxious.        Objective:   Physical Exam  Constitutional: She is oriented to person, place, and time. She appears well-developed and well-nourished. No distress.  HENT:  Head: Normocephalic and atraumatic.  Right Ear: External ear normal. A middle ear effusion is present.  Left Ear: External ear normal. A middle ear effusion is present.  Nose: Nose normal.  Mouth/Throat: Oropharynx is clear and moist. No oropharyngeal exudate.  Eyes: Conjunctivae are normal. Pupils are equal, round, and reactive to light. Right eye exhibits no discharge. Left eye exhibits no discharge. No scleral icterus.  Neck: Normal range of motion. Neck supple. No tracheal deviation present. No thyromegaly present.  Cardiovascular: Normal rate, regular rhythm, normal heart sounds and intact distal pulses.  Exam reveals no gallop and no friction rub.   No murmur heard. Pulmonary/Chest: Effort normal and breath sounds normal. No respiratory distress. She has no wheezes. She has no rales. She exhibits no tenderness.  Musculoskeletal: Normal range of motion. She exhibits no edema and no tenderness.  Lymphadenopathy:    She has no cervical adenopathy.  Neurological: She is alert and oriented to person, place, and time. No cranial nerve deficit. She exhibits normal muscle tone. Coordination normal.  Skin: Skin is warm and dry. No rash noted. She is not diaphoretic. No erythema. No pallor.  Psychiatric: She has a normal mood and affect. Her behavior is normal. Judgment and thought content normal.          Assessment & Plan:

## 2012-12-29 NOTE — Assessment & Plan Note (Signed)
Symptoms have nearly resolved. Occasional ear fullness and bilateral middle ear effusions noted on exam. Discussed using non-sedating antihistamine such as Claritin to help with symptoms.  Pt will call if no improvement.

## 2012-12-29 NOTE — Assessment & Plan Note (Signed)
On regimen of Vit D 2 and 3, prescribed by pain management. Discussed importance of getting follow up Vit D level in 2-3 months.

## 2013-01-17 ENCOUNTER — Ambulatory Visit: Payer: Self-pay | Admitting: Pain Medicine

## 2013-03-24 ENCOUNTER — Ambulatory Visit: Payer: Self-pay | Admitting: Urology

## 2013-03-27 DIAGNOSIS — R3129 Other microscopic hematuria: Secondary | ICD-10-CM | POA: Insufficient documentation

## 2013-03-27 DIAGNOSIS — D4100 Neoplasm of uncertain behavior of unspecified kidney: Secondary | ICD-10-CM | POA: Insufficient documentation

## 2013-03-27 DIAGNOSIS — N23 Unspecified renal colic: Secondary | ICD-10-CM | POA: Insufficient documentation

## 2013-03-27 DIAGNOSIS — K4090 Unilateral inguinal hernia, without obstruction or gangrene, not specified as recurrent: Secondary | ICD-10-CM | POA: Insufficient documentation

## 2013-03-27 DIAGNOSIS — R339 Retention of urine, unspecified: Secondary | ICD-10-CM | POA: Insufficient documentation

## 2013-03-27 DIAGNOSIS — N3946 Mixed incontinence: Secondary | ICD-10-CM | POA: Insufficient documentation

## 2013-06-07 ENCOUNTER — Ambulatory Visit (INDEPENDENT_AMBULATORY_CARE_PROVIDER_SITE_OTHER): Payer: Medicare PPO | Admitting: Internal Medicine

## 2013-06-07 ENCOUNTER — Telehealth: Payer: Self-pay | Admitting: *Deleted

## 2013-06-07 ENCOUNTER — Encounter: Payer: Self-pay | Admitting: Internal Medicine

## 2013-06-07 VITALS — BP 124/82 | HR 92 | Temp 98.2°F | Ht 65.25 in | Wt 178.0 lb

## 2013-06-07 DIAGNOSIS — K219 Gastro-esophageal reflux disease without esophagitis: Secondary | ICD-10-CM

## 2013-06-07 DIAGNOSIS — Z0000 Encounter for general adult medical examination without abnormal findings: Secondary | ICD-10-CM | POA: Insufficient documentation

## 2013-06-07 DIAGNOSIS — Z780 Asymptomatic menopausal state: Secondary | ICD-10-CM | POA: Insufficient documentation

## 2013-06-07 DIAGNOSIS — R0600 Dyspnea, unspecified: Secondary | ICD-10-CM | POA: Insufficient documentation

## 2013-06-07 DIAGNOSIS — R82998 Other abnormal findings in urine: Secondary | ICD-10-CM

## 2013-06-07 DIAGNOSIS — R0609 Other forms of dyspnea: Secondary | ICD-10-CM

## 2013-06-07 DIAGNOSIS — R8290 Unspecified abnormal findings in urine: Secondary | ICD-10-CM | POA: Insufficient documentation

## 2013-06-07 DIAGNOSIS — F17200 Nicotine dependence, unspecified, uncomplicated: Secondary | ICD-10-CM

## 2013-06-07 DIAGNOSIS — R9431 Abnormal electrocardiogram [ECG] [EKG]: Secondary | ICD-10-CM

## 2013-06-07 DIAGNOSIS — E785 Hyperlipidemia, unspecified: Secondary | ICD-10-CM

## 2013-06-07 LAB — CBC WITH DIFFERENTIAL/PLATELET
Basophils Absolute: 0 10*3/uL (ref 0.0–0.1)
Basophils Relative: 0.1 % (ref 0.0–3.0)
Eosinophils Absolute: 0.1 10*3/uL (ref 0.0–0.7)
Eosinophils Relative: 1.3 % (ref 0.0–5.0)
HCT: 43.1 % (ref 36.0–46.0)
Hemoglobin: 14.5 g/dL (ref 12.0–15.0)
Lymphocytes Relative: 12.1 % (ref 12.0–46.0)
Lymphs Abs: 1.2 10*3/uL (ref 0.7–4.0)
MCHC: 33.6 g/dL (ref 30.0–36.0)
MCV: 95.7 fl (ref 78.0–100.0)
Monocytes Absolute: 0.4 10*3/uL (ref 0.1–1.0)
Monocytes Relative: 4.4 % (ref 3.0–12.0)
Neutro Abs: 8.3 10*3/uL — ABNORMAL HIGH (ref 1.4–7.7)
Neutrophils Relative %: 82.1 % — ABNORMAL HIGH (ref 43.0–77.0)
Platelets: 187 10*3/uL (ref 150.0–400.0)
RBC: 4.5 Mil/uL (ref 3.87–5.11)
RDW: 13.1 % (ref 11.5–14.6)
WBC: 10.1 10*3/uL (ref 4.5–10.5)

## 2013-06-07 LAB — LIPID PANEL
Cholesterol: 119 mg/dL (ref 0–200)
HDL: 56.9 mg/dL (ref >39.00)
LDL Cholesterol: 54 mg/dL (ref 0–99)
Total CHOL/HDL Ratio: 2
Triglycerides: 40 mg/dL (ref 0.0–149.0)
VLDL: 8 mg/dL (ref 0.0–40.0)

## 2013-06-07 LAB — COMPREHENSIVE METABOLIC PANEL
AST: 24 U/L (ref 0–37)
Alkaline Phosphatase: 81 U/L (ref 39–117)
BUN: 12 mg/dL (ref 6–23)
Calcium: 9 mg/dL (ref 8.4–10.5)
Creatinine, Ser: 0.7 mg/dL (ref 0.4–1.2)
Glucose, Bld: 90 mg/dL (ref 70–99)
Potassium: 4.2 mEq/L (ref 3.5–5.1)
Sodium: 142 mEq/L (ref 135–145)

## 2013-06-07 LAB — POCT URINALYSIS DIPSTICK
Glucose, UA: NEGATIVE
Ketones, UA: NEGATIVE
Leukocytes, UA: NEGATIVE
Protein, UA: NEGATIVE

## 2013-06-07 LAB — MICROALBUMIN / CREATININE URINE RATIO
Creatinine,U: 213.7 mg/dL
Microalb, Ur: 0.7 mg/dL (ref 0.0–1.9)

## 2013-06-07 MED ORDER — CIPROFLOXACIN HCL 500 MG PO TABS: 500.0000 mg | ORAL_TABLET | Freq: Two times a day (BID) | ORAL | Status: DC

## 2013-06-07 MED ORDER — PANTOPRAZOLE SODIUM 40 MG PO TBEC: 40.0000 mg | DELAYED_RELEASE_TABLET | Freq: Two times a day (BID) | ORAL | Status: DC

## 2013-06-07 NOTE — Assessment & Plan Note (Signed)
Patient reports abnormal urine odor. Urinalysis is positive for blood. Will send urine for culture. Will treat empirically with Cipro 500 mg twice daily. Patient will call if symptoms are not improving.

## 2013-06-07 NOTE — Assessment & Plan Note (Signed)
Strongly encourage smoking cessation. Patient is trying to cut back but does not feel ready to quit. Recommended screening CT of the chest to evaluate for lung cancer. Patient declined because of finances.

## 2013-06-07 NOTE — Assessment & Plan Note (Signed)
Will set up bone density testing. 

## 2013-06-07 NOTE — Progress Notes (Signed)
Subjective:    Patient ID: Heather Beasley, female    DOB: 15-Apr-1956, 57 y.o.   MRN: [id number]  HPI 57 year old female with history of tobacco abuse, depression, GERD, hyperlipidemia presents for annual exam. She reports that she is generally been doing well. She notes some episodes of shortness of breath which occur both at rest and on exertion. This described as tightness in the upper part of her sternum. They resolve after a few minutes without intervention. She does not have any palpitations, other chest pain, or diaphoresis during these episodes. She notes it has been a stressful time recently dealing with issues ongoing with her son. She is trying to cut back on cigarette use but has not already to quit. She is up-to-date on mammogram but is due for bone density testing.  She is also concerned today about recent abnormal smell to her urine. She denies any dysuria, hematuria, change in urinary frequency or urgency. She denies any fever, chills, flank pain.  Outpatient Encounter Prescriptions as of 06/07/2013  Medication Sig Dispense Refill  . albuterol (PROVENTIL HFA;VENTOLIN HFA) 108 (90 BASE) MCG/ACT inhaler Inhale 2 puffs into the lungs every 6 (six) hours as needed.      Marland Kitchen atorvastatin (LIPITOR) 20 MG tablet Take 1 tablet (20 mg total) by mouth daily.  30 tablet  11  . cetirizine (ZYRTEC) 10 MG tablet Take 10 mg by mouth daily as needed. Allergies.      . Cholecalciferol (VITAMIN D3) 3000 UNITS TABS Take by mouth daily.      . cyclobenzaprine (FLEXERIL) 10 MG tablet Take 10 mg by mouth 2 (two) times daily.       . ergocalciferol (VITAMIN D2) 50000 UNITS capsule Take 50,000 Units by mouth 2 (two) times a week.      . fluticasone (FLONASE) 50 MCG/ACT nasal spray Place 2 sprays into the nose 2 (two) times daily.  16 g  6  . gabapentin (NEURONTIN) 300 MG capsule Take 300 mg by mouth 3 (three) times daily. Dr. Laban Emperor adjusted her dose to the following: Start taking 1 pill 4 times a day for 4  then increase to 2 pills in the morning and 3 times for the remainder of the day for 4 days, then 2 pills for breakfast, 2 pills for lunch then 2 more times that day for 4 days until she is taking 3 pills four times a day.      . imipramine (TOFRANIL) 25 MG tablet Take 25 mg by mouth at bedtime.      . ranitidine (ZANTAC) 150 MG tablet Take 1 tablet (150 mg total) by mouth 2 (two) times daily.  30 tablet  3  . terbinafine (LAMISIL) 250 MG tablet Take 250 mg by mouth daily.      Marland Kitchen tiZANidine (ZANAFLEX) 4 MG capsule Take 4 mg by mouth at bedtime as needed.      . pantoprazole (PROTONIX) 40 MG tablet Take 1 tablet (40 mg total) by mouth 2 (two) times daily.  60 tablet  11   No facility-administered encounter medications on file as of 06/07/2013.   BP 124/82  Pulse 92  Temp(Src) 98.2 F (36.8 C) (Oral)  Ht 5' 5.25" (1.657 m)  Wt 178 lb (80.74 kg)  BMI 29.41 kg/m2  SpO2 96%  Review of Systems  Constitutional: Negative for fever, chills, appetite change, fatigue and unexpected weight change.  HENT: Negative for ear pain, congestion, sore throat, trouble swallowing, neck pain, voice change and sinus pressure.  Eyes: Negative for visual disturbance.  Respiratory: Positive for shortness of breath. Negative for cough, wheezing and stridor.   Cardiovascular: Negative for chest pain, palpitations and leg swelling.  Gastrointestinal: Negative for nausea, vomiting, abdominal pain, diarrhea, constipation, blood in stool, abdominal distention and anal bleeding.  Genitourinary: Negative for dysuria and flank pain.  Musculoskeletal: Negative for myalgias, arthralgias and gait problem.  Skin: Negative for color change and rash.  Neurological: Negative for dizziness and headaches.  Hematological: Negative for adenopathy. Does not bruise/bleed easily.  Psychiatric/Behavioral: Negative for suicidal ideas, sleep disturbance and dysphoric mood. The patient is not nervous/anxious.        Objective:    Physical Exam  Constitutional: She is oriented to person, place, and time. She appears well-developed and well-nourished. No distress.  HENT:  Head: Normocephalic and atraumatic.  Right Ear: External ear normal.  Left Ear: External ear normal.  Nose: Nose normal.  Mouth/Throat: Oropharynx is clear and moist. No oropharyngeal exudate.  Eyes: Conjunctivae are normal. Pupils are equal, round, and reactive to light. Right eye exhibits no discharge. Left eye exhibits no discharge. No scleral icterus.  Neck: Normal range of motion. Neck supple. No tracheal deviation present. No thyromegaly present.  Cardiovascular: Normal rate, regular rhythm, normal heart sounds and intact distal pulses.  Exam reveals no gallop and no friction rub.   No murmur heard. Pulmonary/Chest: Effort normal and breath sounds normal. No accessory muscle usage. Not tachypneic. No respiratory distress. She has no decreased breath sounds. She has no wheezes. She has no rales. She exhibits no tenderness. Right breast exhibits no inverted nipple, no mass, no nipple discharge, no skin change and no tenderness. Left breast exhibits no inverted nipple, no mass, no nipple discharge, no skin change and no tenderness. Breasts are symmetrical.  Abdominal: Soft. Bowel sounds are normal. She exhibits no distension and no mass. There is no tenderness. There is no rebound and no guarding.  Musculoskeletal: Normal range of motion. She exhibits no edema and no tenderness.  Lymphadenopathy:    She has no cervical adenopathy.  Neurological: She is alert and oriented to person, place, and time. No cranial nerve deficit. She exhibits normal muscle tone. Coordination normal.  Skin: Skin is warm and dry. No rash noted. She is not diaphoretic. No erythema. No pallor.  Psychiatric: She has a normal mood and affect. Her behavior is normal. Judgment and thought content normal.          Assessment & Plan:

## 2013-06-07 NOTE — Telephone Encounter (Signed)
Message copied by Theola Sequin on Wed Jun 07, 2013  4:31 PM ------      Message from: Ronna Polio A      Created: Wed Jun 07, 2013 11:58 AM       Eber Jones - Can you please send this for culture?      Okey Regal - Can you let pt know that urine appeared infected and we should start Cipro 500mg  po bid x 7 days. We will need to call this in. ------

## 2013-06-07 NOTE — Assessment & Plan Note (Signed)
General medical exam including breast exam normal today. Pap and pelvic deferred as patient is status post complete hysterectomy. Strongly encouraged smoking cessation. Recommended setting up CT of the chest to screen for lung cancer. Patient declines at this time because of finances. EKG today showed T wave inversions and pt has significant risk factors for CAD. Referral for cardiology placed for stress test. Encouraged healthy diet and regular physical activity. Will check labs including CBC, CMP, lipids, urine microalbumin.

## 2013-06-07 NOTE — Telephone Encounter (Signed)
Rx sent to [facility] pharmacy per patient request.

## 2013-06-07 NOTE — Assessment & Plan Note (Signed)
Patient reports episodes of dyspnea with tightness across her upper sternum. EKG shows T-wave inversions. She has risk for coronary artery disease including tobacco abuse, family history. Will set up cardiology evaluation for stress test.

## 2013-06-08 ENCOUNTER — Encounter: Payer: Self-pay | Admitting: *Deleted

## 2013-06-08 LAB — VITAMIN D 25 HYDROXY (VIT D DEFICIENCY, FRACTURES): Vit D, 25-Hydroxy: 33 ng/mL (ref 30–89)

## 2013-06-09 ENCOUNTER — Telehealth: Payer: Self-pay | Admitting: *Deleted

## 2013-06-09 LAB — URINE CULTURE: Colony Count: 40000

## 2013-06-09 MED ORDER — SULFAMETHOXAZOLE-TMP DS 800-160 MG PO TABS: 1.0000 | ORAL_TABLET | Freq: Two times a day (BID) | ORAL | Status: DC

## 2013-06-09 NOTE — Telephone Encounter (Signed)
Message copied by Theola Sequin on Fri Jun 09, 2013  1:21 PM ------      Message from: Ronna Polio A      Created: Fri Jun 09, 2013  1:03 PM       Urine culture showed bacteria that was resistant to Cipro. I would like to change to Bactrim DS tab po bid x 7 days. Please confirm with pt NO allergy to sulfa. ------

## 2013-06-09 NOTE — Telephone Encounter (Signed)
Prescription sent to the pharmacy.

## 2013-06-12 ENCOUNTER — Ambulatory Visit (INDEPENDENT_AMBULATORY_CARE_PROVIDER_SITE_OTHER): Payer: Medicare PPO | Admitting: Cardiovascular Disease

## 2013-06-12 ENCOUNTER — Encounter: Payer: Self-pay | Admitting: Cardiovascular Disease

## 2013-06-12 VITALS — BP 104/80 | HR 83 | Ht 65.0 in | Wt 178.5 lb

## 2013-06-12 DIAGNOSIS — R0609 Other forms of dyspnea: Secondary | ICD-10-CM

## 2013-06-12 DIAGNOSIS — R0600 Dyspnea, unspecified: Secondary | ICD-10-CM

## 2013-06-12 DIAGNOSIS — F17200 Nicotine dependence, unspecified, uncomplicated: Secondary | ICD-10-CM

## 2013-06-12 DIAGNOSIS — R000 Tachycardia, unspecified: Secondary | ICD-10-CM

## 2013-06-12 NOTE — Assessment & Plan Note (Signed)
Current symptoms of exertional dyspnea and occasional tightness is worrisome for underlying obstructive coronary artery disease. Baseline ECG is abnormal and suggestive of anterior wall ischemia. She has multiple risk factors for coronary artery disease. I recommend further evaluation with a pharmacologic nuclear stress test. She is not able to exercise on a treadmill due to previous back surgery. I advised him to start aspirin 81 mg once daily. Baseline ECG shows borderline criteria for right and left atrial enlargement. Given that the predominant symptoms of dyspnea, I will also obtain an echocardiogram.

## 2013-06-12 NOTE — Assessment & Plan Note (Signed)
I discussed with him the importance of smoking cessation. She is actually determined to quit and is planning on doing that next Week.

## 2013-06-12 NOTE — Patient Instructions (Addendum)
Your physician has requested that you have a lexiscan myoview. For further information please visit [email or web address] Please follow instruction sheet, as given.  YOUR APPOINTMENT IS Wednesday, AUGUST 20 AT 8:30, PLEASE ARRIVE AT [facility] [address] AT 8:15.  Your physician has requested that you have an echocardiogram. Echocardiography is a painless test that uses sound waves to create images of your heart. It provides your doctor with information about the size and shape of your heart and how well your heart's chambers and valves are working. This procedure takes approximately one hour. There are no restrictions for this procedure.  Start Aspirin 81 mg once daily.   Follow up after tests.

## 2013-06-12 NOTE — Progress Notes (Signed)
HPI  This is a pleasant 57 year old female, wife of Heather Beasley (who is one of my patients ) who was referred by [name] for evaluation of chest pain. She has chronic medical conditions that include  tobacco abuse, depression, GERD, and hyperlipidemia.She notes some episodes of shortness of breath which occur both at rest and on exertion. This described as tightness in the upper part of her sternum. They resolve after a few minutes without intervention. These symptoms have been going on for about one to 2 months that has worsened recently. Recently, she had an episode of palpitations and sweating which was brief and lasted for a few minutes. She checked her own pulse and found a heart rate of 113 beats per minute. She was noted to have an abnormal EKG with anterior T wave changes recently.  She notes it has been a stressful time recently dealing with issues ongoing with her son. She is determined to quit smoking. She had previous back surgery with physical limitations related to that.  Allergies  Allergen Reactions  . Betadine [Povidone Iodine] Dermatitis and Rash    Topical betadine and iodine have this reaction with patient.  IV contrast is NOT a problem.  jkl  . Latex Dermatitis  . Amoxicillin     REACTION: rash  . Amoxicillin-Pot Clavulanate   . Chantix [Varenicline Tartrate]     Heart racing  . Erythromycin     REACTION: rash  . Sucralfate Nausea And Vomiting    Heart racing Lightheaded      Current Outpatient Prescriptions on File Prior to Visit  Medication Sig Dispense Refill  . albuterol (PROVENTIL HFA;VENTOLIN HFA) 108 (90 BASE) MCG/ACT inhaler Inhale 2 puffs into the lungs every 6 (six) hours as needed.      Marland Kitchen atorvastatin (LIPITOR) 20 MG tablet Take 1 tablet (20 mg total) by mouth daily.  30 tablet  11  . cetirizine (ZYRTEC) 10 MG tablet Take 10 mg by mouth daily as needed. Allergies.      . Cholecalciferol (VITAMIN D3) 3000 UNITS TABS Take by mouth daily.      .  cyclobenzaprine (FLEXERIL) 10 MG tablet Take 10 mg by mouth 2 (two) times daily.       . ergocalciferol (VITAMIN D2) 50000 UNITS capsule Take 50,000 Units by mouth 2 (two) times a week.      . fluticasone (FLONASE) 50 MCG/ACT nasal spray Place 2 sprays into the nose 2 (two) times daily.  16 g  6  . gabapentin (NEURONTIN) 300 MG capsule Take 300 mg by mouth 3 (three) times daily. Dr. Laban Emperor adjusted her dose to the following: Start taking 1 pill 4 times a day for 4 then increase to 2 pills in the morning and 3 times for the remainder of the day for 4 days, then 2 pills for breakfast, 2 pills for lunch then 2 more times that day for 4 days until she is taking 3 pills four times a day.      . imipramine (TOFRANIL) 25 MG tablet Take 25 mg by mouth at bedtime.      . pantoprazole (PROTONIX) 40 MG tablet Take 1 tablet (40 mg total) by mouth 2 (two) times daily.  60 tablet  11  . ranitidine (ZANTAC) 150 MG tablet Take 1 tablet (150 mg total) by mouth 2 (two) times daily.  30 tablet  3  . sulfamethoxazole-trimethoprim (BACTRIM DS) 800-160 MG per tablet Take 1 tablet by mouth 2 (two) times daily.  14 tablet  0  . terbinafine (LAMISIL) 250 MG tablet Take 250 mg by mouth daily.      Marland Kitchen tiZANidine (ZANAFLEX) 4 MG capsule Take 4 mg by mouth at bedtime as needed.       No current facility-administered medications on file prior to visit.     Past Medical History  Diagnosis Date  . Asthma   . Diverticulosis   . Headaches, cluster   . Colon polyps   . IBS (irritable bowel syndrome)   . Allergic rhinitis   . Arthritis   . Cervical spondylosis   . Glaucoma   . Kidney stones   . Hyperlipidemia      Past Surgical History  Procedure Laterality Date  . Appendectomy  1998  . Abdominal hysterectomy  1998  . Lumbar spine surgery  07/2004  . Vaginal delivery      x2  . Arm fx repair  2001  . Cervical fusion  2010  . Cholecystectomy  08/2011    Dr Renda Rolls  . Esophagogastroduodenoscopy  2012    Dr  Markham Jordan  . Colonoscopy      Dr Markham Jordan  . Tonsillectomy  1968  . Cystoscopy      Dr. Lonna Cobb     Family History  Problem Relation Age of Onset  . Hypertension Mother   . Hyperlipidemia Mother   . Diabetes Mother   . Colon cancer Father   . Pancreatic cancer Father   . Cancer Father     Colon & Pancreatic  . Breast cancer Paternal Grandmother   . Diabetes Paternal Grandmother   . Coronary artery disease Paternal Grandmother   . Heart failure Paternal Grandmother   . Cancer Paternal Grandmother     breast  . Stroke Brother      History   Social History  . Marital Status: Married    Spouse Name: N/A    Number of Children: 2  . Years of Education: N/A   Occupational History  . Cashier    Social History Main Topics  . Smoking status: Current Every Day Smoker -- 0.70 packs/day for 39 years    Types: Cigarettes  . Smokeless tobacco: Never Used  . Alcohol Use: No  . Drug Use: No  . Sexual Activity: Not on file   Other Topics Concern  . Not on file   Social History Narrative   Lives in [place] with husband. Dog in home. Work - disabled for neck and back pain.     ROS A 10 point review of system was performed. It's negative other than what is mentioned in the history of present illness.  PHYSICAL EXAM   BP 104/80  Pulse 83  Ht 5\' 5"  (1.651 m)  Wt 178 lb 8 oz (80.967 kg)  BMI 29.7 kg/m2 Constitutional: She is oriented to person, place, and time. She appears well-developed and well-nourished. No distress.  HENT: No nasal discharge.  Head: Normocephalic and atraumatic.  Eyes: Pupils are equal and round. Right eye exhibits no discharge. Left eye exhibits no discharge.  Neck: Normal range of motion. Neck supple. No JVD present. No thyromegaly present.  Cardiovascular: Normal rate, regular rhythm, normal heart sounds. Exam reveals no gallop and no friction rub. No murmur heard.  Pulmonary/Chest: Effort normal and breath sounds normal. No stridor. No  respiratory distress. She has no wheezes. She has no rales. She exhibits no tenderness.  Abdominal: Soft. Bowel sounds are normal. She exhibits no distension. There is no tenderness.  There is no rebound and no guarding.  Musculoskeletal: Normal range of motion. She exhibits no edema and no tenderness.  Neurological: She is alert and oriented to person, place, and time. Coordination normal.  Skin: Skin is warm and dry. No rash noted. She is not diaphoretic. No erythema. No pallor.  Psychiatric: She has a normal mood and affect. Her behavior is normal. Judgment and thought content normal.     [id number]  Rhythm  Anterior T wave inversions suggestive of anterior ischemia -Nonspecific ST depression     ABNORMAL    ASSESSMENT AND PLAN

## 2013-06-14 ENCOUNTER — Ambulatory Visit (HOSPITAL_COMMUNITY): Payer: Medicare PPO | Attending: Cardiovascular Disease | Admitting: Radiology

## 2013-06-14 VITALS — BP 127/81 | Ht 65.0 in | Wt 176.0 lb

## 2013-06-14 DIAGNOSIS — R0989 Other specified symptoms and signs involving the circulatory and respiratory systems: Secondary | ICD-10-CM | POA: Insufficient documentation

## 2013-06-14 DIAGNOSIS — R0609 Other forms of dyspnea: Secondary | ICD-10-CM | POA: Insufficient documentation

## 2013-06-14 DIAGNOSIS — E785 Hyperlipidemia, unspecified: Secondary | ICD-10-CM | POA: Insufficient documentation

## 2013-06-14 DIAGNOSIS — R002 Palpitations: Secondary | ICD-10-CM | POA: Insufficient documentation

## 2013-06-14 DIAGNOSIS — R079 Chest pain, unspecified: Secondary | ICD-10-CM | POA: Insufficient documentation

## 2013-06-14 DIAGNOSIS — R42 Dizziness and giddiness: Secondary | ICD-10-CM | POA: Insufficient documentation

## 2013-06-14 DIAGNOSIS — R0602 Shortness of breath: Secondary | ICD-10-CM | POA: Insufficient documentation

## 2013-06-14 DIAGNOSIS — R9431 Abnormal electrocardiogram [ECG] [EKG]: Secondary | ICD-10-CM | POA: Insufficient documentation

## 2013-06-14 DIAGNOSIS — F17200 Nicotine dependence, unspecified, uncomplicated: Secondary | ICD-10-CM | POA: Insufficient documentation

## 2013-06-14 DIAGNOSIS — R0600 Dyspnea, unspecified: Secondary | ICD-10-CM

## 2013-06-14 DIAGNOSIS — R61 Generalized hyperhidrosis: Secondary | ICD-10-CM | POA: Insufficient documentation

## 2013-06-14 MED ORDER — REGADENOSON 0.4 MG/5ML IV SOLN
0.4000 mg | Freq: Once | INTRAVENOUS | Status: AC
  Administered 2013-06-14: 0.4 mg via INTRAVENOUS

## 2013-06-14 MED ORDER — TECHNETIUM TC 99M SESTAMIBI GENERIC - CARDIOLITE
30.0000 | Freq: Once | INTRAVENOUS | Status: AC | PRN
  Administered 2013-06-14: 30 via INTRAVENOUS

## 2013-06-14 MED ORDER — TECHNETIUM TC 99M SESTAMIBI GENERIC - CARDIOLITE
10.0000 | Freq: Once | INTRAVENOUS | Status: AC | PRN
  Administered 2013-06-14: 10 via INTRAVENOUS

## 2013-06-14 NOTE — Progress Notes (Signed)
[facility] SITE 3 NUCLEAR MED [address] [place], [place] [address] [phone]    Cardiology Nuclear Med Study  [name] Heather Beasley is a 57 y.o. female     MRN : [id number]     DOB: 1955/12/23  Procedure Date: 06/14/2013  Nuclear Med Background Indication for Stress Test:  Evaluation for Ischemia and Abnormal EKG History:  No prior CAD Cardiac Risk Factors: Lipids and Smoker  Symptoms:  Chest Pain, Diaphoresis, DOE, Light-Headedness, Palpitations, Rapid HR and SOB   Nuclear Pre-Procedure Caffeine/Decaff Intake:  None > 12 hrs NPO After: 7:30pm   Lungs:  clear O2 Sat: 97% on room air. IV 0.9% NS with Angio Cath:  22g  IV Site: R Antecubital x 1, tolerated well IV Started by:  Irean Hong, RN  Chest Size (in):  36 Cup Size: C  Height: 5\' 5"  (1.651 m)  Weight:  176 lb (79.833 kg)  BMI:  Body mass index is 29.29 kg/(m^2). Tech Comments:  n/a    Nuclear Med Study 1 or 2 day study: 1 day  Stress Test Type:  Lexiscan  Reading MD: Charlton Haws, MD  Order Authorizing Provider:  Lorine Bears, MD  Resting Radionuclide: Technetium 20m Sestamibi  Resting Radionuclide Dose: 11.0 mCi   Stress Radionuclide:  Technetium 79m Sestamibi  Stress Radionuclide Dose: 33.0 mCi           Stress Protocol Rest HR: 78 Stress HR: 120  Rest BP: 127/81 Stress BP: 128/66  Exercise Time (min): n/a METS: n/a   Predicted Max HR: 165 bpm % Max HR: 72.73 bpm Rate Pressure Product: 46962   Dose of Adenosine (mg):  n/a Dose of Lexiscan: 0.4 mg  Dose of Atropine (mg): n/a Dose of Dobutamine: n/a mcg/kg/min (at max HR)  Stress Test Technologist: Bonnita Levan, RN  Nuclear Technologist:  Domenic Polite, CNMT     Rest Procedure:  Myocardial perfusion imaging was performed at rest 45 minutes following the intravenous administration of Technetium 56m Sestamibi. Rest ECG: NSR with non-specific ST-T wave changes and RAE  Stress Procedure:  The patient received IV Lexiscan 0.4 mg over  15-seconds.  Technetium 60m Sestamibi injected at 30-seconds.  Quantitative spect images were obtained after a 45 minute delay. Stress ECG: No significant change from baseline ECG  QPS Raw Data Images:  Normal; no motion artifact; normal heart/lung ratio. Stress Images:  Normal homogeneous uptake in all areas of the myocardium. Rest Images:  Normal homogeneous uptake in all areas of the myocardium. Subtraction (SDS):  Normal Transient Ischemic Dilatation (Normal <1.22):  n/a Lung/Heart Ratio (Normal <0.45):  0.35  Quantitative Gated Spect Images QGS EDV:  53 ml QGS ESV:  11 ml  Impression Exercise Capacity:  Lexiscan with no exercise. BP Response:  Normal blood pressure response. Clinical Symptoms:  There is dyspnea. ECG Impression:  No significant ST segment change suggestive of ischemia. Comparison with Prior Nuclear Study: No images to compare  Overall Impression:  Normal stress nuclear study.  LV Ejection Fraction: 79%.  LV Wall Motion:  NL LV Function; NL Wall Motion   Charlton Haws

## 2013-06-20 ENCOUNTER — Telehealth: Payer: Self-pay

## 2013-06-20 ENCOUNTER — Ambulatory Visit: Payer: Self-pay | Admitting: Internal Medicine

## 2013-06-20 ENCOUNTER — Telehealth: Payer: Self-pay | Admitting: Internal Medicine

## 2013-06-20 NOTE — Telephone Encounter (Signed)
Bone density testing showed osteopenia. T-score was -1.9. Please set up follow up appointment to discuss within the next 2 months.

## 2013-06-20 NOTE — Telephone Encounter (Signed)
Message copied by Marilynne Halsted on Tue Jun 20, 2013  5:12 PM ------      Message from: Lorine Bears A      Created: Sun Jun 18, 2013 10:37 AM       Inform patient that  stress test was normal. She should still follow up with me after the echo to reevaluate her symptoms. ------

## 2013-06-20 NOTE — Telephone Encounter (Signed)
Message copied by Marilynne Halsted on Tue Jun 20, 2013  5:13 PM ------      Message from: Lorine Bears A      Created: Sun Jun 18, 2013 10:37 AM       Inform patient that  stress test was normal. She should still follow up with me after the echo to reevaluate her symptoms. ------

## 2013-06-20 NOTE — Telephone Encounter (Signed)
Pt informed of results.

## 2013-06-20 NOTE — Telephone Encounter (Signed)
Patient informed and verbalized understanding. Not able to schedule appointment now, will call back to schedule.

## 2013-06-20 NOTE — Telephone Encounter (Signed)
No answer or voicemail to leave message 

## 2013-06-27 ENCOUNTER — Other Ambulatory Visit: Payer: Medicare PPO

## 2013-06-28 ENCOUNTER — Other Ambulatory Visit (HOSPITAL_COMMUNITY): Payer: Self-pay | Admitting: Cardiovascular Disease

## 2013-06-28 DIAGNOSIS — R0602 Shortness of breath: Secondary | ICD-10-CM

## 2013-06-28 DIAGNOSIS — R079 Chest pain, unspecified: Secondary | ICD-10-CM

## 2013-06-28 DIAGNOSIS — R9431 Abnormal electrocardiogram [ECG] [EKG]: Secondary | ICD-10-CM

## 2013-06-30 ENCOUNTER — Ambulatory Visit (HOSPITAL_COMMUNITY): Payer: Medicare PPO | Attending: Cardiovascular Disease | Admitting: Radiology

## 2013-06-30 DIAGNOSIS — R0602 Shortness of breath: Secondary | ICD-10-CM | POA: Insufficient documentation

## 2013-06-30 DIAGNOSIS — R9431 Abnormal electrocardiogram [ECG] [EKG]: Secondary | ICD-10-CM

## 2013-06-30 DIAGNOSIS — R079 Chest pain, unspecified: Secondary | ICD-10-CM

## 2013-06-30 DIAGNOSIS — R000 Tachycardia, unspecified: Secondary | ICD-10-CM | POA: Insufficient documentation

## 2013-06-30 DIAGNOSIS — R609 Edema, unspecified: Secondary | ICD-10-CM | POA: Insufficient documentation

## 2013-06-30 DIAGNOSIS — F17200 Nicotine dependence, unspecified, uncomplicated: Secondary | ICD-10-CM | POA: Insufficient documentation

## 2013-06-30 DIAGNOSIS — I079 Rheumatic tricuspid valve disease, unspecified: Secondary | ICD-10-CM | POA: Insufficient documentation

## 2013-06-30 DIAGNOSIS — E785 Hyperlipidemia, unspecified: Secondary | ICD-10-CM | POA: Insufficient documentation

## 2013-06-30 DIAGNOSIS — I059 Rheumatic mitral valve disease, unspecified: Secondary | ICD-10-CM | POA: Insufficient documentation

## 2013-06-30 DIAGNOSIS — R072 Precordial pain: Secondary | ICD-10-CM | POA: Insufficient documentation

## 2013-06-30 NOTE — Progress Notes (Signed)
Echocardiogram performed.  

## 2013-07-03 ENCOUNTER — Encounter: Payer: Self-pay | Admitting: Internal Medicine

## 2013-07-04 ENCOUNTER — Ambulatory Visit: Payer: Self-pay | Admitting: Cardiovascular Disease

## 2013-07-04 ENCOUNTER — Encounter: Payer: Self-pay | Admitting: Cardiovascular Disease

## 2013-07-04 ENCOUNTER — Telehealth: Payer: Self-pay

## 2013-07-04 NOTE — Telephone Encounter (Signed)
Schedule right leg venous duplex to rule out DVT . This should be done today.

## 2013-07-04 NOTE — Telephone Encounter (Signed)
Spoke w/ pt.  She is aware of results and will keep appt on 9/22 w/ Dr. Kirke Corin. For past few days, right foot and ankle have swollen up to "twice the size of the left foot". She sat with feet up in the recliner, which did not help. No TEDs, No diuretics, has "cut salt intake back to almost nothing". Has "back issues" & cannot tie her shoes, so she wears flip flops.  When she picked her grandson up from school, another parent told her that she should get to the doctor now, and it has scared her.  Please advise.  Thank you.

## 2013-07-04 NOTE — Telephone Encounter (Signed)
Message copied by Marilynne Halsted on Tue Jul 04, 2013  9:22 AM ------      Message from: Lorine Bears A      Created: Sun Jul 02, 2013  1:01 PM       Inform patient that echo overall looked ok with mild abnormalities. EF is normal. There is mild mitral valve regurgitation (no serious at this stage). ------

## 2013-07-04 NOTE — Telephone Encounter (Signed)
Spoke w/ pt.  She is scheduled to arrive for a rt leg venous duplex today at 12:45, report to be called to Dr. Jari Sportsman pager.

## 2013-07-11 ENCOUNTER — Telehealth: Payer: Self-pay

## 2013-07-11 NOTE — Telephone Encounter (Signed)
Message copied by Marilynne Halsted on Tue Jul 11, 2013  4:48 PM ------      Message from: Lorine Bears A      Created: Sun Jul 02, 2013  1:01 PM       Inform patient that echo overall looked ok with mild abnormalities. EF is normal. There is mild mitral valve regurgitation (no serious at this stage). ------

## 2013-07-12 ENCOUNTER — Telehealth: Payer: Self-pay

## 2013-07-12 NOTE — Telephone Encounter (Signed)
Message copied by Marilynne Halsted on Wed Jul 12, 2013  8:36 AM ------      Message from: Heather Beasley      Created: Sun Jul 02, 2013  1:01 PM       Inform patient that echo overall looked ok with mild abnormalities. EF is normal. There is mild mitral valve regurgitation (no serious at this stage). ------

## 2013-07-12 NOTE — Telephone Encounter (Signed)
Spoke w/ pt.  She is aware of results and will keep appt on Monday.

## 2013-07-17 ENCOUNTER — Encounter: Payer: Self-pay | Admitting: Cardiovascular Disease

## 2013-07-17 ENCOUNTER — Ambulatory Visit (INDEPENDENT_AMBULATORY_CARE_PROVIDER_SITE_OTHER): Payer: Medicare PPO | Admitting: Cardiovascular Disease

## 2013-07-17 VITALS — BP 129/85 | HR 80 | Ht 65.5 in | Wt 181.5 lb

## 2013-07-17 DIAGNOSIS — R0989 Other specified symptoms and signs involving the circulatory and respiratory systems: Secondary | ICD-10-CM

## 2013-07-17 DIAGNOSIS — R609 Edema, unspecified: Secondary | ICD-10-CM

## 2013-07-17 DIAGNOSIS — R600 Localized edema: Secondary | ICD-10-CM

## 2013-07-17 DIAGNOSIS — R0609 Other forms of dyspnea: Secondary | ICD-10-CM

## 2013-07-17 DIAGNOSIS — R0789 Other chest pain: Secondary | ICD-10-CM

## 2013-07-17 DIAGNOSIS — R0600 Dyspnea, unspecified: Secondary | ICD-10-CM

## 2013-07-17 DIAGNOSIS — R0602 Shortness of breath: Secondary | ICD-10-CM

## 2013-07-17 NOTE — Progress Notes (Signed)
HPI  This is a pleasant 57 year old female, wife of Aleathea Pugmire (who is one of my patients ) who is here today for a followup visit regarding chest pain. She has chronic medical conditions that include  tobacco abuse, depression, GERD, and hyperlipidemia.She notes some episodes of shortness of breath which occur both at rest and on exertion. This is described as tightness in the upper part of her sternum. They resolve after a few minutes without intervention. These symptoms have been going on for about one to 2 months .  She was noted to have an abnormal EKG with anterior T wave changes recently. She notes it has been a stressful time recently dealing with issues ongoing with her son.  She underwent evaluation with a pharmacologic nuclear stress test which showed no evidence of ischemia with normal EF. Echo showed only mild mitral regurgitation. She called [place] due to unilateral right leg swelling. We requested LE venous doppler which was negative for DVT. She is overall feeling better.    Allergies  Allergen Reactions  . Betadine [Povidone Iodine] Dermatitis and Rash    Topical betadine and iodine have this reaction with patient.  IV contrast is NOT a problem.  jkl  . Latex Dermatitis  . Amoxicillin     REACTION: rash  . Amoxicillin-Pot Clavulanate   . Chantix [Varenicline Tartrate]     Heart racing  . Erythromycin     REACTION: rash  . Sucralfate Nausea And Vomiting    Heart racing Lightheaded      Current Outpatient Prescriptions on File Prior to Visit  Medication Sig Dispense Refill  . albuterol (PROVENTIL HFA;VENTOLIN HFA) 108 (90 BASE) MCG/ACT inhaler Inhale 2 puffs into the lungs every 6 (six) hours as needed.      Marland Kitchen atorvastatin (LIPITOR) 20 MG tablet Take 1 tablet (20 mg total) by mouth daily.  30 tablet  11  . cetirizine (ZYRTEC) 10 MG tablet Take 10 mg by mouth daily as needed. Allergies.      . Cholecalciferol (VITAMIN D3) 3000 UNITS TABS Take by mouth daily.      .  cyclobenzaprine (FLEXERIL) 10 MG tablet Take 10 mg by mouth 2 (two) times daily.       . ergocalciferol (VITAMIN D2) 50000 UNITS capsule Take 50,000 Units by mouth 2 (two) times a week.      . fluticasone (FLONASE) 50 MCG/ACT nasal spray Place 2 sprays into the nose 2 (two) times daily.  16 g  6  . gabapentin (NEURONTIN) 300 MG capsule Take 300 mg by mouth 3 (three) times daily. Dr. Laban Emperor adjusted her dose to the following: Start taking 1 pill 4 times a day for 4 then increase to 2 pills in the morning and 3 times for the remainder of the day for 4 days, then 2 pills for breakfast, 2 pills for lunch then 2 more times that day for 4 days until she is taking 3 pills four times a day.      . imipramine (TOFRANIL) 25 MG tablet Take 25 mg by mouth at bedtime.      . pantoprazole (PROTONIX) 40 MG tablet Take 1 tablet (40 mg total) by mouth 2 (two) times daily.  60 tablet  11  . ranitidine (ZANTAC) 150 MG tablet Take 1 tablet (150 mg total) by mouth 2 (two) times daily.  30 tablet  3  . sulfamethoxazole-trimethoprim (BACTRIM DS) 800-160 MG per tablet Take 1 tablet by mouth 2 (two) times daily.  14 tablet  0  . terbinafine (LAMISIL) 250 MG tablet Take 250 mg by mouth daily.      Marland Kitchen tiZANidine (ZANAFLEX) 4 MG capsule Take 4 mg by mouth at bedtime as needed.       No current facility-administered medications on file prior to visit.     Past Medical History  Diagnosis Date  . Asthma   . Diverticulosis   . Headaches, cluster   . Colon polyps   . IBS (irritable bowel syndrome)   . Allergic rhinitis   . Arthritis   . Cervical spondylosis   . Glaucoma   . Kidney stones   . Hyperlipidemia      Past Surgical History  Procedure Laterality Date  . Appendectomy  1998  . Abdominal hysterectomy  1998  . Lumbar spine surgery  07/2004  . Vaginal delivery      x2  . Arm fx repair  2001  . Cervical fusion  2010  . Cholecystectomy  08/2011    Dr Renda Rolls  . Esophagogastroduodenoscopy  2012    Dr  Markham Jordan  . Colonoscopy      Dr Markham Jordan  . Tonsillectomy  1968  . Cystoscopy      Dr. Lonna Cobb     Family History  Problem Relation Age of Onset  . Hypertension Mother   . Hyperlipidemia Mother   . Diabetes Mother   . Colon cancer Father   . Pancreatic cancer Father   . Cancer Father     Colon & Pancreatic  . Breast cancer Paternal Grandmother   . Diabetes Paternal Grandmother   . Coronary artery disease Paternal Grandmother   . Heart failure Paternal Grandmother   . Cancer Paternal Grandmother     breast  . Stroke Brother      History   Social History  . Marital Status: Married    Spouse Name: N/A    Number of Children: 2  . Years of Education: N/A   Occupational History  . Cashier    Social History Main Topics  . Smoking status: Current Every Day Smoker -- 0.70 packs/day for 39 years    Types: Cigarettes  . Smokeless tobacco: Never Used  . Alcohol Use: No  . Drug Use: No  . Sexual Activity: Not on file   Other Topics Concern  . Not on file   Social History Narrative   Lives in [place] with husband. Dog in home. Work - disabled for neck and back pain.     ROS A 10 point review of system was performed. It's negative other than what is mentioned in the history of present illness.  PHYSICAL EXAM   There were no vitals taken for this visit. Constitutional: She is oriented to person, place, and time. She appears well-developed and well-nourished. No distress.  HENT: No nasal discharge.  Head: Normocephalic and atraumatic.  Eyes: Pupils are equal and round. Right eye exhibits no discharge. Left eye exhibits no discharge.  Neck: Normal range of motion. Neck supple. No JVD present. No thyromegaly present.  Cardiovascular: Normal rate, regular rhythm, normal heart sounds. Exam reveals no gallop and no friction rub. No murmur heard.  Pulmonary/Chest: Effort normal and breath sounds normal. No stridor. No respiratory distress. She has no wheezes. She has no  rales. She exhibits no tenderness.  Abdominal: Soft. Bowel sounds are normal. She exhibits no distension. There is no tenderness. There is no rebound and no guarding.  Musculoskeletal: Normal range of motion. She exhibits  trace right leg edema and no tenderness.  Neurological: She is alert and oriented to person, place, and time. Coordination normal.  Skin: Skin is warm and dry. No rash noted. She is not diaphoretic. No erythema. No pallor.  Psychiatric: She has a normal mood and affect. Her behavior is normal. Judgment and thought content normal.     [id number]  Rhythm  Anterior T wave inversions suggestive of anterior ischemia  ABNORMAL    ASSESSMENT AND PLAN

## 2013-07-17 NOTE — Patient Instructions (Addendum)
Continue same medications.  Follow up in 4 months.  

## 2013-07-19 DIAGNOSIS — R600 Localized edema: Secondary | ICD-10-CM | POA: Insufficient documentation

## 2013-07-19 NOTE — Assessment & Plan Note (Signed)
Recent right leg edema is likely due to chronic venous insufficieny. Doppler was negative for DVT. I advised her to elevate her legs during the day and use support stocking if needed.

## 2013-07-19 NOTE — Assessment & Plan Note (Signed)
Negative cardiac work up so far. Both stress test and echo were overall unremarkable. Dyspnea is likely multifactorial. Continue to monitor.  Due to abnormal ECG with anterior TW changes, I will have her follow up with me in few months to ensure stability of symptoms. Continue Aspirin.

## 2013-07-25 ENCOUNTER — Other Ambulatory Visit: Payer: Medicare PPO

## 2013-08-04 ENCOUNTER — Emergency Department (HOSPITAL_COMMUNITY)
Admission: EM | Admit: 2013-08-04 | Discharge: 2013-08-04 | Disposition: A | Discharge status: Home / Self Care | Payer: Medicare PPO | Attending: Emergency Medicine | Admitting: Emergency Medicine

## 2013-08-04 ENCOUNTER — Emergency Department (HOSPITAL_COMMUNITY): Payer: Medicare PPO

## 2013-08-04 ENCOUNTER — Encounter (HOSPITAL_COMMUNITY): Payer: Self-pay | Admitting: Emergency Medicine

## 2013-08-04 DIAGNOSIS — IMO0002 Reserved for concepts with insufficient information to code with codable children: Secondary | ICD-10-CM | POA: Insufficient documentation

## 2013-08-04 DIAGNOSIS — Z8679 Personal history of other diseases of the circulatory system: Secondary | ICD-10-CM | POA: Insufficient documentation

## 2013-08-04 DIAGNOSIS — J45909 Unspecified asthma, uncomplicated: Secondary | ICD-10-CM | POA: Insufficient documentation

## 2013-08-04 DIAGNOSIS — R4701 Aphasia: Secondary | ICD-10-CM | POA: Insufficient documentation

## 2013-08-04 DIAGNOSIS — K589 Irritable bowel syndrome without diarrhea: Secondary | ICD-10-CM | POA: Insufficient documentation

## 2013-08-04 DIAGNOSIS — E785 Hyperlipidemia, unspecified: Secondary | ICD-10-CM | POA: Insufficient documentation

## 2013-08-04 DIAGNOSIS — H409 Unspecified glaucoma: Secondary | ICD-10-CM | POA: Insufficient documentation

## 2013-08-04 DIAGNOSIS — H53149 Visual discomfort, unspecified: Secondary | ICD-10-CM | POA: Insufficient documentation

## 2013-08-04 DIAGNOSIS — Z79899 Other long term (current) drug therapy: Secondary | ICD-10-CM | POA: Insufficient documentation

## 2013-08-04 DIAGNOSIS — G43909 Migraine, unspecified, not intractable, without status migrainosus: Secondary | ICD-10-CM

## 2013-08-04 DIAGNOSIS — M129 Arthropathy, unspecified: Secondary | ICD-10-CM | POA: Insufficient documentation

## 2013-08-04 DIAGNOSIS — R112 Nausea with vomiting, unspecified: Secondary | ICD-10-CM | POA: Insufficient documentation

## 2013-08-04 DIAGNOSIS — Z91040 Latex allergy status: Secondary | ICD-10-CM | POA: Insufficient documentation

## 2013-08-04 DIAGNOSIS — F17200 Nicotine dependence, unspecified, uncomplicated: Secondary | ICD-10-CM | POA: Insufficient documentation

## 2013-08-04 LAB — URINALYSIS, ROUTINE W REFLEX MICROSCOPIC
Bilirubin Urine: NEGATIVE
Glucose, UA: NEGATIVE mg/dL
Hgb urine dipstick: NEGATIVE
Ketones, ur: NEGATIVE mg/dL
Protein, ur: NEGATIVE mg/dL
Urobilinogen, UA: 0.2 mg/dL (ref 0.0–1.0)
pH: 6.5 (ref 5.0–8.0)

## 2013-08-04 LAB — POCT I-STAT, CHEM 8
BUN: 7 mg/dL (ref 6–23)
Calcium, Ion: 1.13 mmol/L (ref 1.12–1.23)
Chloride: 105 mEq/L (ref 96–112)
Creatinine, Ser: 0.9 mg/dL (ref 0.50–1.10)
Glucose, Bld: 93 mg/dL (ref 70–99)
Hemoglobin: 15.3 g/dL — ABNORMAL HIGH (ref 12.0–15.0)
Potassium: 3.6 mEq/L (ref 3.5–5.1)
TCO2: 26 mmol/L (ref 0–100)

## 2013-08-04 LAB — RAPID URINE DRUG SCREEN, HOSP PERFORMED
Amphetamines: NOT DETECTED
Benzodiazepines: NOT DETECTED
Opiates: NOT DETECTED
Tetrahydrocannabinol: NOT DETECTED

## 2013-08-04 LAB — CSF CELL COUNT WITH DIFFERENTIAL
Eosinophils, CSF: NONE SEEN % (ref 0–1)
Eosinophils, CSF: NONE SEEN % (ref 0–1)
Segmented Neutrophils-CSF: NONE SEEN % (ref 0–6)
Tube #: 1
Tube #: 4
WBC, CSF: 0 /mm3 (ref 0–5)
WBC, CSF: 1 /mm3 (ref 0–5)

## 2013-08-04 LAB — GLUCOSE, CAPILLARY: Glucose-Capillary: 94 mg/dL (ref 70–99)

## 2013-08-04 LAB — COMPREHENSIVE METABOLIC PANEL
AST: 19 U/L (ref 0–37)
Albumin: 4.1 g/dL (ref 3.5–5.2)
Alkaline Phosphatase: 92 U/L (ref 39–117)
Chloride: 104 mEq/L (ref 96–112)
Creatinine, Ser: 0.7 mg/dL (ref 0.50–1.10)
Potassium: 3.6 mEq/L (ref 3.5–5.1)
Total Bilirubin: 0.5 mg/dL (ref 0.3–1.2)
Total Protein: 7 g/dL (ref 6.0–8.3)

## 2013-08-04 LAB — ETHANOL: Alcohol, Ethyl (B): <11 mg/dL (ref 0–11)

## 2013-08-04 LAB — CBC
MCH: 31.4 pg (ref 26.0–34.0)
MCHC: 32.8 g/dL (ref 30.0–36.0)
RDW: 12.9 % (ref 11.5–15.5)

## 2013-08-04 LAB — POCT I-STAT TROPONIN I: Troponin i, poc: 0 ng/mL (ref 0.00–0.08)

## 2013-08-04 LAB — PROTIME-INR
INR: 1.03 (ref 0.00–1.49)
Prothrombin Time: 13.3 seconds (ref 11.6–15.2)

## 2013-08-04 LAB — DIFFERENTIAL
Basophils Absolute: 0 10*3/uL (ref 0.0–0.1)
Basophils Relative: 0 % (ref 0–1)
Eosinophils Absolute: 0 10*3/uL (ref 0.0–0.7)
Monocytes Absolute: 0.3 10*3/uL (ref 0.1–1.0)
Neutro Abs: 2.8 10*3/uL (ref 1.7–7.7)
Neutrophils Relative %: 61 % (ref 43–77)

## 2013-08-04 LAB — GLUCOSE, CSF: Glucose, CSF: 66 mg/dL (ref 43–76)

## 2013-08-04 LAB — APTT: aPTT: 30 seconds (ref 24–37)

## 2013-08-04 LAB — PROTEIN, CSF: Total  Protein, CSF: 14 mg/dL — ABNORMAL LOW (ref 15–45)

## 2013-08-04 LAB — GRAM STAIN: Gram Stain: NONE SEEN

## 2013-08-04 LAB — TROPONIN I: Troponin I: <0.3 ng/mL (ref <0.30)

## 2013-08-04 MED ORDER — SODIUM CHLORIDE 0.9 % IV SOLN
Freq: Once | INTRAVENOUS | Status: AC
  Administered 2013-08-04: 18:00:00 via INTRAVENOUS

## 2013-08-04 MED ORDER — ONDANSETRON HCL 4 MG/2ML IJ SOLN
4.0000 mg | Freq: Once | INTRAMUSCULAR | Status: AC
  Administered 2013-08-04: 4 mg via INTRAVENOUS
  Filled 2013-08-04: qty 2

## 2013-08-04 MED ORDER — METOCLOPRAMIDE HCL 5 MG/ML IJ SOLN
10.0000 mg | Freq: Once | INTRAMUSCULAR | Status: AC
  Administered 2013-08-04: 10 mg via INTRAVENOUS
  Filled 2013-08-04: qty 2

## 2013-08-04 MED ORDER — LORAZEPAM 2 MG/ML IJ SOLN
1.0000 mg | Freq: Once | INTRAMUSCULAR | Status: AC
  Administered 2013-08-04: 1 mg via INTRAVENOUS
  Filled 2013-08-04: qty 1

## 2013-08-04 MED ORDER — DEXAMETHASONE SODIUM PHOSPHATE 10 MG/ML IJ SOLN
10.0000 mg | Freq: Once | INTRAMUSCULAR | Status: AC
  Administered 2013-08-04: 10 mg via INTRAVENOUS
  Filled 2013-08-04: qty 1

## 2013-08-04 MED ORDER — DIPHENHYDRAMINE HCL 50 MG/ML IJ SOLN
25.0000 mg | Freq: Once | INTRAMUSCULAR | Status: AC
  Administered 2013-08-04: 25 mg via INTRAVENOUS
  Filled 2013-08-04: qty 1

## 2013-08-04 NOTE — ED Notes (Signed)
C/o h/a since 0300. Then c/o left side weakness & slurred speech. Unknown LSN.

## 2013-08-04 NOTE — ED Notes (Signed)
C/o n/v, emesis x 5 today. +photophobia. Reports has seen her MD for h/a in past & has tried many different meds for them. Also states has never felt a h/a this bad before. Pt crying & anxious.

## 2013-08-04 NOTE — Consult Note (Signed)
Reason for Consult:Headache Referring Physician: Pollina  CC: Headache with nausea and vomiting, left sided weakness  HPI: Heather Beasley is an 57 y.o. female who reports that she went to bed normal last evening.  She was awakened at 0311 feeling nauseous and with vomiting.  At that time patient also had a headache that she describes as being deep and throbbing.  Rated the headache at a 10/10.  The patient was able to go back to sleep but awakened again with nausea and vomiting.  Noticed left sided weakness and slurred speech as well.  Patient was brought to the ED for further evaluation  Head CT was performed and has been reviewed.  CT shows no evidence of acute abnormalities.  LP was performed as well.  Red cells were 9300 in the first tube but 390 in the fourth tube.  Only 1 wbc was seen.  Protein was 14 and glucose was 66.  Patient was treated with Decadron, Reglan and Benadryl.  The patient is sleepy but she reports that her speech has improved, her headache has resolved and her left side is no longer weak.   The patient has a history of cluster headaches.  She is currently not seeing anyone for them.  Her headache today was nothing like her cluster headaches in the past.    Past Medical History  Diagnosis Date  . Asthma   . Diverticulosis   . Headaches, cluster   . Colon polyps   . IBS (irritable bowel syndrome)   . Allergic rhinitis   . Arthritis   . Cervical spondylosis   . Glaucoma   . Kidney stones   . Hyperlipidemia     Past Surgical History  Procedure Laterality Date  . Appendectomy  1998  . Abdominal hysterectomy  1998  . Lumbar spine surgery  07/2004  . Vaginal delivery      x2  . Arm fx repair  2001  . Cervical fusion  2010  . Cholecystectomy  08/2011    Dr Renda Rolls  . Esophagogastroduodenoscopy  2012    Dr Markham Jordan  . Colonoscopy      Dr Markham Jordan  . Tonsillectomy  1968  . Cystoscopy      Dr. Lonna Cobb    Family History  Problem Relation Age of Onset  .  Hypertension Mother   . Hyperlipidemia Mother   . Diabetes Mother   . Colon cancer Father   . Pancreatic cancer Father   . Cancer Father     Colon & Pancreatic  . Breast cancer Paternal Grandmother   . Diabetes Paternal Grandmother   . Coronary artery disease Paternal Grandmother   . Heart failure Paternal Grandmother   . Cancer Paternal Grandmother     breast  . Stroke Brother     Social History:  reports that she has been smoking Cigarettes.  She has a 27.3 pack-year smoking history. She has never used smokeless tobacco. She reports that she does not drink alcohol or use illicit drugs.  Allergies  Allergen Reactions  . Betadine [Povidone Iodine] Dermatitis and Rash    Topical betadine and iodine have this reaction with patient.  IV contrast is NOT a problem.  jkl  . Latex Dermatitis  . Amoxicillin     REACTION: rash  . Amoxicillin-Pot Clavulanate   . Chantix [Varenicline Tartrate]     Heart racing  . Erythromycin     REACTION: rash  . Sucralfate Nausea And Vomiting    Heart racing Lightheaded  Medications: I have reviewed the patient's current medications. Prior to Admission:  Current outpatient prescriptions:albuterol (PROVENTIL HFA;VENTOLIN HFA) 108 (90 BASE) MCG/ACT inhaler, Inhale 2 puffs into the lungs every 6 (six) hours as needed., Disp: , Rfl: ;  aspirin EC 81 MG tablet, Take 81 mg by mouth daily., Disp: , Rfl: ;  atorvastatin (LIPITOR) 20 MG tablet, Take 1 tablet (20 mg total) by mouth daily., Disp: 30 tablet, Rfl: 11 cetirizine (ZYRTEC) 10 MG tablet, Take 10 mg by mouth daily as needed. Allergies., Disp: , Rfl: ;  cholecalciferol (VITAMIN D) 1000 UNITS tablet, Take 3,000 Units by mouth daily., Disp: , Rfl: ;  cyclobenzaprine (FLEXERIL) 10 MG tablet, Take 10 mg by mouth 2 (two) times daily. , Disp: , Rfl: ;  gabapentin (NEURONTIN) 300 MG capsule, Take 300 mg by mouth 3 (three) times daily. , Disp: , Rfl:  imipramine (TOFRANIL) 25 MG tablet, Take 25 mg by mouth at  bedtime., Disp: , Rfl: ;  pantoprazole (PROTONIX) 40 MG tablet, Take 1 tablet (40 mg total) by mouth 2 (two) times daily., Disp: 60 tablet, Rfl: 11;  ranitidine (ZANTAC) 150 MG tablet, Take 1 tablet (150 mg total) by mouth 2 (two) times daily., Disp: 30 tablet, Rfl: 3;  terbinafine (LAMISIL) 250 MG tablet, Take 250 mg by mouth at bedtime. , Disp: , Rfl:   ROS: History obtained from the patient  General ROS: negative for - chills, fatigue, fever, night sweats, weight gain or weight loss Psychological ROS: negative for - behavioral disorder, hallucinations, memory difficulties, mood swings or suicidal ideation Ophthalmic ROS: negative for - blurry vision, double vision, eye pain or loss of vision ENT ROS: negative for - epistaxis, nasal discharge, oral lesions, sore throat, tinnitus or vertigo Allergy and Immunology ROS: negative for - hives or itchy/watery eyes Hematological and Lymphatic ROS: negative for - bleeding problems, bruising or swollen lymph nodes Endocrine ROS: negative for - galactorrhea, hair pattern changes, polydipsia/polyuria or temperature intolerance Respiratory ROS: negative for - cough, hemoptysis, shortness of breath or wheezing Cardiovascular ROS: negative for - chest pain, dyspnea on exertion, edema or irregular heartbeat Gastrointestinal ROS: as noted in HPI Genito-Urinary ROS: negative for - dysuria, hematuria, incontinence or urinary frequency/urgency Musculoskeletal ROS: negative for - joint swelling or muscular weakness Neurological ROS: as noted in HPI Dermatological ROS: negative for rash and skin lesion changes  Physical Examination: Blood pressure 124/74, pulse 64, temperature 98.3 F (36.8 C), temperature source Oral, resp. rate 20, SpO2 95.00%.  Neurologic Examination Mental Status: Lethargic. Oriented but at times has difficulty with response to questions due to lethargy.  Speech fluent without evidence of aphasia.  Able to follow 3 step commands without  difficulty. Cranial Nerves: II: Discs flat bilaterally; Visual fields grossly normal, pupils equal, round, reactive to light and accommodation III,IV, VI: ptosis not present, extra-ocular motions intact bilaterally V,VII: decreased right NLF, facial light touch sensation normal bilaterally VIII: hearing normal bilaterally IX,X: gag reflex present XI: bilateral shoulder shrug XII: midline tongue extension Motor: Right : Upper extremity   5/5    Left:     Upper extremity   5/5  Lower extremity   5/5     Lower extremity   5/5 Tone and bulk:normal tone throughout; no atrophy noted Sensory: Pinprick and light touch intact throughout, bilaterally Deep Tendon Reflexes: 2+ and symmetric with absent AJ's bilaterally Plantars: Right: downgoing   Left: downgoing Cerebellar: normal finger-to-nose and normal heel-to-shin test Gait: unable to test CV: pulses palpable throughout  Laboratory Studies:   Basic Metabolic Panel:  Recent Labs Lab 08/04/13 1531 08/04/13 1636  NA 142 145  K 3.6 3.6  CL 104 105  CO2 28  --   GLUCOSE 93 93  BUN 8 7  CREATININE 0.70 0.90  CALCIUM 9.3  --     Liver Function Tests:  Recent Labs Lab 08/04/13 1531  AST 19  ALT 11  ALKPHOS 92  BILITOT 0.5  PROT 7.0  ALBUMIN 4.1   No results found for this basename: LIPASE, AMYLASE,  in the last 168 hours No results found for this basename: AMMONIA,  in the last 168 hours  CBC:  Recent Labs Lab 08/04/13 1531 08/04/13 1636  WBC 4.6  --   NEUTROABS 2.8  --   HGB 13.7 15.3*  HCT 41.8 45.0  MCV 95.7  --   PLT 190  --     Cardiac Enzymes:  Recent Labs Lab 08/04/13 1531  TROPONINI <0.30    BNP: No components found with this basename: POCBNP,   CBG:  Recent Labs Lab 08/04/13 1622  GLUCAP 94    Microbiology: Results for orders placed during the hospital encounter of 08/04/13  GRAM STAIN     Status: None   Collection Time    08/04/13  6:53 PM      Result Value Range Status    Specimen Description CSF   Final   Special Requests NO 2 1CC   Final   Gram Stain     Final   Value: NO WBC SEEN     NO ORGANISMS SEEN     CYTOSPIN SLIDE   Report Status 08/04/2013 FINAL   Final    Coagulation Studies:  Recent Labs  08/04/13 1531  LABPROT 13.3  INR 1.03    Urinalysis:  Recent Labs Lab 08/04/13 1718  COLORURINE YELLOW  LABSPEC 1.012  PHURINE 6.5  GLUCOSEU NEGATIVE  HGBUR NEGATIVE  BILIRUBINUR NEGATIVE  KETONESUR NEGATIVE  PROTEINUR NEGATIVE  UROBILINOGEN 0.2  NITRITE NEGATIVE  LEUKOCYTESUR NEGATIVE    Lipid Panel:     Component Value Date/Time   CHOL 119 06/07/2013 1126   TRIG 40.0 06/07/2013 1126   HDL 56.90 06/07/2013 1126   CHOLHDL 2 06/07/2013 1126   VLDL 8.0 06/07/2013 1126   LDLCALC 54 06/07/2013 1126    HgbA1C:  No results found for this basename: HGBA1C    Urine Drug Screen:     Component Value Date/Time   LABOPIA NONE DETECTED 08/04/2013 1718   COCAINSCRNUR NONE DETECTED 08/04/2013 1718   LABBENZ NONE DETECTED 08/04/2013 1718   AMPHETMU NONE DETECTED 08/04/2013 1718   THCU NONE DETECTED 08/04/2013 1718   LABBARB NONE DETECTED 08/04/2013 1718    Alcohol Level:  Recent Labs Lab 08/04/13 1531  ETH <11    Other results: EKG: sinus rhythm at 86 bpm.  Imaging: Ct Head Wo Contrast  08/04/2013   CLINICAL DATA:  57 year old female with headache and left-sided weakness.  EXAM: CT HEAD WITHOUT CONTRAST  TECHNIQUE: Contiguous axial images were obtained from the base of the skull through the vertex without intravenous contrast.  COMPARISON:  None  FINDINGS: Mild chronic small-vessel white matter ischemic changes are noted.  No acute intracranial abnormalities are identified, including mass lesion or mass effect, hydrocephalus, extra-axial fluid collection, midline shift, hemorrhage, or acute infarction. The visualized bony calvarium is unremarkable. The  IMPRESSION: No evidence of acute intracranial abnormality.  Chronic small-vessel white  matter ischemic changes.   Electronically Signed  By: Laveda Abbe M.D.   On: 08/04/2013 16:27     Assessment/Plan: 57 year old female with a history of headaches who presents with a headache that is unusual for her and the worst headache she has ever had.  Headache is now resolved.  Head CT and LP were not indicative of a SAH.  Imaging showed no evidence of a focal abnormality either.  Suspect a complicated migraine.  Patient on ASA daily.  Recommendations: 1.  Patient to follow up with PCP on an outpatient basis.  No further neurologic intervention recommended at this time.    Thana Farr, MD [facility] [phone] 08/04/2013, 10:06 PM

## 2013-08-04 NOTE — ED Notes (Signed)
Spoke with Robin, P.A about moving patient to Pod C- P.A stated that she felt more comfortable with the patient staying in Pod A.

## 2013-08-04 NOTE — ED Notes (Signed)
Dr. Reynolds at bedside.

## 2013-08-04 NOTE — ED Provider Notes (Signed)
Medical screening examination/treatment/procedure(s) were conducted as a shared visit with non-physician practitioner(s) and myself.  I personally evaluated the patient during the encounter.  The patient presents with severe headache. She does have a history of headaches that have been labeled cluster headaches, but the headache today was different. With her concomitant neurologic symptoms including expressive aphasia, it was felt imperative to rule out significant intracranial problem such as subarachnoid hemorrhage. CAT scan did not show any abnormality. It was therefore felt that a lumbar puncture should be performed on the patient. Lumbar puncture was performed and there are no acute findings. Her initial RBC count was very high, but the lumbar puncture was extremely difficult because of prior surgery and was obviously a bloody tap. IVCs appropriately cleared in tube 4, indicating no concern for subarachnoid hemorrhage. Likewise no concern for infection. Case discussed with neurology, will be discharged as she is feeling much better after treatment for migraine headache. Most likely a complex migraine.  PROCEDURE: Lumbar Puncture The patient was placed in the seated with help from the nursing staff. The area was cleansed and draped in usual sterile fashion. Anesthesia was achieved with 1% lidocaine. A 20-gauge 3.5-inch spinal needle was placed in the L4-L5 interspace. After multiple attempts and repositioning, bloody cerebral spinal fluid was obtained. Four tubes were filled with 4 mL of CSF. Blood cleared by the fourth tube. These were sent for the usual tests, including 1 tube to be held for further analysis if needed. The patient had no immediate complications and tolerated the procedure well.    Gilda Crease, MD 08/04/13 2139

## 2013-08-04 NOTE — ED Provider Notes (Signed)
CSN: [id number]     Arrival date & time 08/04/13  1514 History   First MD Initiated Contact with Patient 08/04/13 1518     No chief complaint on file.  (Consider location/radiation/quality/duration/timing/severity/associated sxs/prior Treatment) HPI Comments: 57 year old female with a past medical history of hyperlipidemia, glaucoma, hypertension, cluster headaches and arthritis presents to the emergency department complaining of a headache. Patient states around 3:00 this morning she woke up from sleep nauseated, vomited and immediately developed a severe headache. Headache is radiating throughout her entire head, described as sharp and stabbing rated 10 out of 10. She tried taking Tylenol without relief. Headache has been constant since, she has vomited 5 times throughout the day. Admits to associated photophobia. Denies visual disturbance. Around 1:00 this afternoon, she began to have difficulty expressing her words with slight slurred speech. No history of the same. She has a history of cluster headaches, however states this headache is completely different. She reports that her blood pressure has been elevated throughout the day today. Denies fever, chills, abdominal pain, rashes, neck pain or stiffness.  The history is provided by the patient.    Past Medical History  Diagnosis Date  . Asthma   . Diverticulosis   . Headaches, cluster   . Colon polyps   . IBS (irritable bowel syndrome)   . Allergic rhinitis   . Arthritis   . Cervical spondylosis   . Glaucoma   . Kidney stones   . Hyperlipidemia    Past Surgical History  Procedure Laterality Date  . Appendectomy  1998  . Abdominal hysterectomy  1998  . Lumbar spine surgery  07/2004  . Vaginal delivery      x2  . Arm fx repair  2001  . Cervical fusion  2010  . Cholecystectomy  08/2011    Dr Renda Rolls  . Esophagogastroduodenoscopy  2012    Dr Markham Jordan  . Colonoscopy      Dr Markham Jordan  . Tonsillectomy  1968  . Cystoscopy       Dr. Lonna Cobb   Family History  Problem Relation Age of Onset  . Hypertension Mother   . Hyperlipidemia Mother   . Diabetes Mother   . Colon cancer Father   . Pancreatic cancer Father   . Cancer Father     Colon & Pancreatic  . Breast cancer Paternal Grandmother   . Diabetes Paternal Grandmother   . Coronary artery disease Paternal Grandmother   . Heart failure Paternal Grandmother   . Cancer Paternal Grandmother     breast  . Stroke Brother    History  Substance Use Topics  . Smoking status: Current Every Day Smoker -- 0.70 packs/day for 39 years    Types: Cigarettes  . Smokeless tobacco: Never Used  . Alcohol Use: No   OB History   Grav Para Term Preterm Abortions TAB SAB Ect Mult Living                 Review of Systems  Constitutional: Negative for fever and chills.  Eyes: Positive for photophobia. Negative for visual disturbance.  Respiratory: Negative for shortness of breath.   Cardiovascular: Negative for chest pain.  Musculoskeletal: Negative for neck pain and neck stiffness.  Skin: Negative for rash.  Neurological: Positive for weakness and headaches. Negative for dizziness, syncope, facial asymmetry and numbness.  Psychiatric/Behavioral: Positive for confusion.  All other systems reviewed and are negative.    Allergies  Betadine; Latex; Amoxicillin; Amoxicillin-pot clavulanate; Chantix; Erythromycin; and Sucralfate  Home Medications   Current Outpatient Rx  Name  Route  Sig  Dispense  Refill  . albuterol (PROVENTIL HFA;VENTOLIN HFA) 108 (90 BASE) MCG/ACT inhaler   Inhalation   Inhale 2 puffs into the lungs every 6 (six) hours as needed.         Marland Kitchen atorvastatin (LIPITOR) 20 MG tablet   Oral   Take 1 tablet (20 mg total) by mouth daily.   30 tablet   11   . cetirizine (ZYRTEC) 10 MG tablet   Oral   Take 10 mg by mouth daily as needed. Allergies.         . cyclobenzaprine (FLEXERIL) 10 MG tablet   Oral   Take 10 mg by mouth 2 (two) times  daily.          . ergocalciferol (VITAMIN D2) 50000 UNITS capsule   Oral   Take 50,000 Units by mouth 2 (two) times a week.         . fluticasone (FLONASE) 50 MCG/ACT nasal spray   Nasal   Place 2 sprays into the nose 2 (two) times daily.   16 g   6   . gabapentin (NEURONTIN) 300 MG capsule   Oral   Take 300 mg by mouth 3 (three) times daily. Dr. Laban Emperor adjusted her dose to the following: Start taking 1 pill 4 times a day for 4 then increase to 2 pills in the morning and 3 times for the remainder of the day for 4 days, then 2 pills for breakfast, 2 pills for lunch then 2 more times that day for 4 days until she is taking 3 pills four times a day.         . imipramine (TOFRANIL) 25 MG tablet   Oral   Take 25 mg by mouth at bedtime.         . pantoprazole (PROTONIX) 40 MG tablet   Oral   Take 1 tablet (40 mg total) by mouth 2 (two) times daily.   60 tablet   11   . ranitidine (ZANTAC) 150 MG tablet   Oral   Take 1 tablet (150 mg total) by mouth 2 (two) times daily.   30 tablet   3   . terbinafine (LAMISIL) 250 MG tablet   Oral   Take 250 mg by mouth daily.          BP 145/77  Pulse 87  Temp(Src) 99 F (37.2 C) (Oral)  Resp 16  SpO2 97% Physical Exam  Nursing note and vitals reviewed. Constitutional: She is oriented to person, place, and time. She appears well-developed and well-nourished. She appears distressed.  HENT:  Head: Normocephalic and atraumatic.  Mouth/Throat: Oropharynx is clear and moist.  Eyes: Conjunctivae and EOM are normal. Pupils are equal, round, and reactive to light.  Neck: Normal range of motion. Neck supple.  Cardiovascular: Normal rate, regular rhythm and normal heart sounds.   Pulmonary/Chest: Effort normal and breath sounds normal.  Abdominal: Soft. Bowel sounds are normal. There is no tenderness.  Musculoskeletal: Normal range of motion. She exhibits no edema.  Neurological: She is alert and oriented to person, place, and time.  No cranial nerve deficit. She exhibits normal muscle tone. Coordination normal. GCS eye subscore is 4. GCS verbal subscore is 5. GCS motor subscore is 6.  Strength upper and lower extremity 5 out of 5 on right compared to 3/5 on left. Sensation intact. Slight expressive aphasia.  Skin: Skin is warm and dry. She  is not diaphoretic.  Psychiatric: Her behavior is normal. Her mood appears anxious.    ED Course  Procedures (including critical care time) Labs Review Labs Reviewed  CSF CELL COUNT WITH DIFFERENTIAL - Abnormal; Notable for the following:    Color, CSF PINK (*)    Appearance, CSF HAZY (*)    RBC Count, CSF 9300 (*)    All other components within normal limits  CSF CELL COUNT WITH DIFFERENTIAL - Abnormal; Notable for the following:    RBC Count, CSF 390 (*)    All other components within normal limits  PROTEIN, CSF - Abnormal; Notable for the following:    Total  Protein, CSF 14 (*)    All other components within normal limits  POCT I-STAT, CHEM 8 - Abnormal; Notable for the following:    Hemoglobin 15.3 (*)    All other components within normal limits  GRAM STAIN  CSF CULTURE  ETHANOL  PROTIME-INR  APTT  CBC  DIFFERENTIAL  COMPREHENSIVE METABOLIC PANEL  TROPONIN I  URINE RAPID DRUG SCREEN (HOSP PERFORMED)  URINALYSIS, ROUTINE W REFLEX MICROSCOPIC  GLUCOSE, CAPILLARY  GLUCOSE, CSF  POCT I-STAT TROPONIN I   Imaging Review Ct Head Wo Contrast  08/04/2013   CLINICAL DATA:  57 year old female with headache and left-sided weakness.  EXAM: CT HEAD WITHOUT CONTRAST  TECHNIQUE: Contiguous axial images were obtained from the base of the skull through the vertex without intravenous contrast.  COMPARISON:  None  FINDINGS: Mild chronic small-vessel white matter ischemic changes are noted.  No acute intracranial abnormalities are identified, including mass lesion or mass effect, hydrocephalus, extra-axial fluid collection, midline shift, hemorrhage, or acute infarction. The  visualized bony calvarium is unremarkable. The  IMPRESSION: No evidence of acute intracranial abnormality.  Chronic small-vessel white matter ischemic changes.   Electronically Signed   By: Laveda Abbe M.D.   On: 08/04/2013 16:27    EKG Interpretation   None       Date: 08/04/2013  Rate: 86  Rhythm: normal sinus rhythm  QRS Axis: normal  Intervals: QT prolonged  ST/T Wave abnormalities: nonspecific T wave changes  Conduction Disutrbances:none  Narrative Interpretation: no stemi  Old EKG Reviewed: changes noted QT prolonged, not present on EKG from 07/17/2013    MDM   1. Migraine     Patient with sudden onset headache after vomiting. Headache constant throughout the day, worse headache of her life. Different from her cluster headaches. Difficulty speaking beginning about 2 and half hours prior to arrival. Concern for subarachnoid hemorrhage. CT head pending. Cannot rule out CVA on differential, stroke order set pending. She appears nervous, blood pressure controlled at this time at 145/77. Afebrile. 4:39 PM CT head normal. Headache 8/10 after receiving Zofran. Will treat her headache with Decadron, Benadryl and Reglan. She appears anxious, will give 1 mg Ativan. Labs still pending. Will do LP. Case discussed with my attending Dr. Blinda Leatherwood who agrees with plan of care. Husband now present in room, states patient's blood pressures have been up and down all week in the 200s/100s. 9:31 PM Lumbar puncture within normal limits. Patient was also evaluated by neuro hospitalists Dr. Thad Ranger. She does not feel there is any emergent intervention needed at this time. Her symptoms have resolved and her neurologic exam is unremarkable. Diagnosis of a migraine. She is stable for discharge. Return precautions discussed. Patient states understanding of plan and is agreeable.  Trevor Mace, PA-C 08/04/13 2132

## 2013-08-08 ENCOUNTER — Encounter: Payer: Self-pay | Admitting: Internal Medicine

## 2013-08-08 ENCOUNTER — Ambulatory Visit (INDEPENDENT_AMBULATORY_CARE_PROVIDER_SITE_OTHER): Payer: Medicare PPO | Admitting: Internal Medicine

## 2013-08-08 VITALS — BP 150/90 | HR 76 | Temp 98.1°F | Wt 177.0 lb

## 2013-08-08 DIAGNOSIS — F419 Anxiety disorder, unspecified: Secondary | ICD-10-CM

## 2013-08-08 DIAGNOSIS — R51 Headache: Secondary | ICD-10-CM

## 2013-08-08 DIAGNOSIS — I10 Essential (primary) hypertension: Secondary | ICD-10-CM

## 2013-08-08 DIAGNOSIS — R519 Headache, unspecified: Secondary | ICD-10-CM | POA: Insufficient documentation

## 2013-08-08 DIAGNOSIS — F411 Generalized anxiety disorder: Secondary | ICD-10-CM

## 2013-08-08 LAB — CSF CULTURE

## 2013-08-08 LAB — CSF CULTURE W GRAM STAIN: Gram Stain: NONE SEEN

## 2013-08-08 MED ORDER — ALPRAZOLAM 0.25 MG PO TABS: 0.2500 mg | ORAL_TABLET | Freq: Three times a day (TID) | ORAL | Status: DC | PRN

## 2013-08-08 MED ORDER — METOPROLOL SUCCINATE ER 25 MG PO TB24: 25.0000 mg | ORAL_TABLET | Freq: Every day | ORAL | Status: DC

## 2013-08-08 MED ORDER — ONDANSETRON HCL 8 MG PO TABS: 8.0000 mg | ORAL_TABLET | Freq: Three times a day (TID) | ORAL | Status: DC | PRN

## 2013-08-08 MED ORDER — KETOROLAC TROMETHAMINE 30 MG/ML IJ SOLN
30.0000 mg | Freq: Once | INTRAMUSCULAR | Status: AC
  Administered 2013-08-08: 30 mg via INTRAMUSCULAR

## 2013-08-08 NOTE — Progress Notes (Signed)
Subjective:    Patient ID: Heather Beasley, female    DOB: 10/30/1955, 57 y.o.   MRN: [id number]  HPI 57YO female with h/o anxiety presents as walk-in acute visit. She reports that on Friday, 3am, woke with headache and vomiting. Then, developed "thickening" of tongue and difficulty speaking. Called 911, BP 200s/100s went to [facility] ED. CT head was normal. Had lumbar puncture, which was normal. Diagnosed with migraine. Treated with pain meds and discharged home. Over weekend, persistent headache. Described as diffuse aching. Associated with mild nausea. No photo or phonophobia. BP today 190s/100s. Notes significant anxiety recently, dealing with family issues. Had migraines in distant past, but none recently.  Outpatient Encounter Prescriptions as of 08/08/2013  Medication Sig Dispense Refill  . albuterol (PROVENTIL HFA;VENTOLIN HFA) 108 (90 BASE) MCG/ACT inhaler Inhale 2 puffs into the lungs every 6 (six) hours as needed.      Marland Kitchen aspirin EC 81 MG tablet Take 81 mg by mouth daily.      Marland Kitchen atorvastatin (LIPITOR) 20 MG tablet Take 1 tablet (20 mg total) by mouth daily.  30 tablet  11  . cetirizine (ZYRTEC) 10 MG tablet Take 10 mg by mouth daily as needed. Allergies.      . cholecalciferol (VITAMIN D) 1000 UNITS tablet Take 3,000 Units by mouth daily.      . cyclobenzaprine (FLEXERIL) 10 MG tablet Take 10 mg by mouth 2 (two) times daily.       Marland Kitchen gabapentin (NEURONTIN) 300 MG capsule Take 300 mg by mouth 3 (three) times daily.       Marland Kitchen imipramine (TOFRANIL) 25 MG tablet Take 25 mg by mouth at bedtime.      . pantoprazole (PROTONIX) 40 MG tablet Take 1 tablet (40 mg total) by mouth 2 (two) times daily.  60 tablet  11  . ranitidine (ZANTAC) 150 MG tablet Take 1 tablet (150 mg total) by mouth 2 (two) times daily.  30 tablet  3  . terbinafine (LAMISIL) 250 MG tablet Take 250 mg by mouth at bedtime.        No facility-administered encounter medications on file as of 08/08/2013.    Review of Systems   Constitutional: Negative for fever, chills, appetite change, fatigue and unexpected weight change.  HENT: Negative for congestion, ear pain, sinus pressure, sore throat, trouble swallowing and voice change.   Eyes: Negative for visual disturbance.  Respiratory: Negative for cough, shortness of breath, wheezing and stridor.   Cardiovascular: Negative for chest pain, palpitations and leg swelling.  Gastrointestinal: Negative for nausea, vomiting, abdominal pain, diarrhea, constipation, blood in stool, abdominal distention and anal bleeding.  Genitourinary: Negative for dysuria and flank pain.  Musculoskeletal: Negative for arthralgias, gait problem, myalgias and neck pain.  Skin: Negative for color change and rash.  Neurological: Positive for speech difficulty and headaches. Negative for dizziness, tremors, seizures, syncope, weakness, light-headedness and numbness.  Hematological: Negative for adenopathy. Does not bruise/bleed easily.  Psychiatric/Behavioral: Negative for suicidal ideas, sleep disturbance and dysphoric mood. The patient is nervous/anxious.    BP 150/90  Pulse 76  Temp(Src) 98.1 F (36.7 C) (Oral)  Wt 177 lb (80.287 kg)  BMI 29 kg/m2  SpO2 97%     Objective:   Physical Exam  Constitutional: She is oriented to person, place, and time. She appears well-developed and well-nourished. No distress.  HENT:  Head: Normocephalic and atraumatic.  Right Ear: External ear normal.  Left Ear: External ear normal.  Nose: Nose normal.  Mouth/Throat: Oropharynx  is clear and moist. No oropharyngeal exudate.  Eyes: Conjunctivae are normal. Pupils are equal, round, and reactive to light. Right eye exhibits no discharge. Left eye exhibits no discharge. No scleral icterus.  Neck: Normal range of motion. Neck supple. No tracheal deviation present. No thyromegaly present.  Cardiovascular: Normal rate, regular rhythm, normal heart sounds and intact distal pulses.  Exam reveals no gallop and  no friction rub.   No murmur heard. Pulmonary/Chest: Effort normal and breath sounds normal. No accessory muscle usage. Not tachypneic. No respiratory distress. She has no decreased breath sounds. She has no wheezes. She has no rhonchi. She has no rales. She exhibits no tenderness.  Musculoskeletal: Normal range of motion. She exhibits no edema and no tenderness.  Lymphadenopathy:    She has no cervical adenopathy.  Neurological: She is alert and oriented to person, place, and time. No cranial nerve deficit. She exhibits normal muscle tone. Coordination normal.  Skin: Skin is warm and dry. No rash noted. She is not diaphoretic. No erythema. No pallor.  Psychiatric: Her speech is normal and behavior is normal. Judgment and thought content normal. Her mood appears anxious. Cognition and memory are normal.          Assessment & Plan:

## 2013-08-08 NOTE — Assessment & Plan Note (Signed)
BP elevated today. Will add metoprolol 25mg  daily. Plan repeat check tomorrow.

## 2013-08-08 NOTE — Assessment & Plan Note (Signed)
Will add alprazolam as needed for anxiety.

## 2013-08-08 NOTE — Assessment & Plan Note (Signed)
Headache most consistent with mixed migraine/tension-type headache. Likely exacerbated by recent anxiety. Will give Toradol and Zofran today for acute headache. Will start Metoprolol to better control BP, and as potential preventative measure for migraines. Will start alprazolam for anxiety. Pt will also have MRI brain given associated symptoms of slurred speech and left sided weakness with initial headache. Reviewed CT head today which was normal. Follow up tomorrow.

## 2013-08-09 ENCOUNTER — Ambulatory Visit: Payer: Self-pay | Admitting: Internal Medicine

## 2013-08-09 ENCOUNTER — Ambulatory Visit: Payer: Medicare PPO | Admitting: Internal Medicine

## 2013-08-09 ENCOUNTER — Telehealth: Payer: Self-pay | Admitting: Internal Medicine

## 2013-08-09 NOTE — Telephone Encounter (Signed)
MRI brain showed changes consistent with small vessel chronic ischemia. No acute stroke or other abnormalities were seen. I would like to set up neurology evaluation to review the MRI.

## 2013-08-09 NOTE — Telephone Encounter (Signed)
Patient informed and verbally agreed understanding. Call transferred to [facility] for neurology referral information

## 2013-08-09 NOTE — Telephone Encounter (Signed)
Left message to call back  

## 2013-08-10 ENCOUNTER — Encounter: Payer: Self-pay | Admitting: Internal Medicine

## 2013-08-10 ENCOUNTER — Ambulatory Visit (INDEPENDENT_AMBULATORY_CARE_PROVIDER_SITE_OTHER): Payer: Medicare PPO | Admitting: Internal Medicine

## 2013-08-10 VITALS — BP 120/80 | HR 69 | Temp 98.6°F | Wt 177.0 lb

## 2013-08-10 DIAGNOSIS — R51 Headache: Secondary | ICD-10-CM

## 2013-08-10 DIAGNOSIS — I10 Essential (primary) hypertension: Secondary | ICD-10-CM

## 2013-08-10 DIAGNOSIS — Z23 Encounter for immunization: Secondary | ICD-10-CM

## 2013-08-10 DIAGNOSIS — F411 Generalized anxiety disorder: Secondary | ICD-10-CM

## 2013-08-10 DIAGNOSIS — F419 Anxiety disorder, unspecified: Secondary | ICD-10-CM

## 2013-08-10 NOTE — Assessment & Plan Note (Signed)
Symptoms most consistent with migraine headaches exacerbated by recent increase in anxiety. MRI brain showed changes consistent with chronic small vessel disease, but no acute process. Will continue Metoprolol and Alprazolam prn. Neurology evaluation pending for next week.

## 2013-08-10 NOTE — Progress Notes (Signed)
Subjective:    Patient ID: Heather Beasley, female    DOB: Oct 14, 1956, 57 y.o.   MRN: [id number]  HPI 57 year old female with history of anxiety, hyperlipidemia, hypertension presents for followup after recent episode of severe headache. She reports that her headache has improved significantly since her last visit. She continues to have some mild aching pain which she describes as deep within her head. She is not currently taking anything for this. We reviewed MRI of the brain which was performed yesterday. This showed chronic small vessel disease but no acute process. She reports that symptoms of slurred speech and left-sided weakness have completely resolved. She continues to describe significant anxiety related to ongoing issues with her son. She is tearful describing this. She reports the use of alprazolam has been helpful for episodes of acute anxiety.  Outpatient Encounter Prescriptions as of 08/10/2013  Medication Sig Dispense Refill  . albuterol (PROVENTIL HFA;VENTOLIN HFA) 108 (90 BASE) MCG/ACT inhaler Inhale 2 puffs into the lungs every 6 (six) hours as needed.      . ALPRAZolam (XANAX) 0.25 MG tablet Take 1 tablet (0.25 mg total) by mouth 3 (three) times daily as needed for sleep or anxiety.  90 tablet  0  . aspirin EC 81 MG tablet Take 81 mg by mouth daily.      Marland Kitchen atorvastatin (LIPITOR) 20 MG tablet Take 1 tablet (20 mg total) by mouth daily.  30 tablet  11  . cetirizine (ZYRTEC) 10 MG tablet Take 10 mg by mouth daily as needed. Allergies.      . cholecalciferol (VITAMIN D) 1000 UNITS tablet Take 3,000 Units by mouth daily.      . cyclobenzaprine (FLEXERIL) 10 MG tablet Take 10 mg by mouth 2 (two) times daily.       Marland Kitchen gabapentin (NEURONTIN) 300 MG capsule Take 300 mg by mouth 3 (three) times daily.       Marland Kitchen imipramine (TOFRANIL) 25 MG tablet Take 25 mg by mouth at bedtime.      . metoprolol succinate (TOPROL-XL) 25 MG 24 hr tablet Take 1 tablet (25 mg total) by mouth daily.  90 tablet  3   . ondansetron (ZOFRAN) 8 MG tablet Take 1 tablet (8 mg total) by mouth every 8 (eight) hours as needed for nausea.  90 tablet  0  . pantoprazole (PROTONIX) 40 MG tablet Take 1 tablet (40 mg total) by mouth 2 (two) times daily.  60 tablet  11  . ranitidine (ZANTAC) 150 MG tablet Take 1 tablet (150 mg total) by mouth 2 (two) times daily.  30 tablet  3  . terbinafine (LAMISIL) 250 MG tablet Take 250 mg by mouth at bedtime.        No facility-administered encounter medications on file as of 08/10/2013.   BP 120/80  Pulse 69  Temp(Src) 98.6 F (37 C) (Oral)  Wt 177 lb (80.287 kg)  BMI 29 kg/m2  SpO2 96%  Review of Systems  Constitutional: Positive for fatigue. Negative for fever, chills, appetite change and unexpected weight change.  HENT: Negative for congestion, ear pain, sinus pressure, sore throat, trouble swallowing and voice change.   Eyes: Negative for visual disturbance.  Respiratory: Negative for cough, shortness of breath, wheezing and stridor.   Cardiovascular: Negative for chest pain, palpitations and leg swelling.  Gastrointestinal: Negative for nausea, vomiting, abdominal pain, diarrhea, constipation, blood in stool, abdominal distention and anal bleeding.  Genitourinary: Negative for dysuria and flank pain.  Musculoskeletal: Negative for arthralgias, gait  problem, myalgias and neck pain.  Skin: Negative for color change and rash.  Neurological: Positive for headaches. Negative for dizziness.  Hematological: Negative for adenopathy. Does not bruise/bleed easily.  Psychiatric/Behavioral: Negative for suicidal ideas, sleep disturbance and dysphoric mood. The patient is nervous/anxious.        Objective:   Physical Exam  Constitutional: She is oriented to person, place, and time. She appears well-developed and well-nourished. No distress.  HENT:  Head: Normocephalic and atraumatic.  Right Ear: External ear normal.  Left Ear: External ear normal.  Nose: Nose normal.   Mouth/Throat: Oropharynx is clear and moist. No oropharyngeal exudate.  Eyes: Conjunctivae are normal. Pupils are equal, round, and reactive to light. Right eye exhibits no discharge. Left eye exhibits no discharge. No scleral icterus.  Neck: Normal range of motion. Neck supple. No tracheal deviation present. No thyromegaly present.  Cardiovascular: Normal rate, regular rhythm, normal heart sounds and intact distal pulses.  Exam reveals no gallop and no friction rub.   No murmur heard. Pulmonary/Chest: Effort normal and breath sounds normal. No accessory muscle usage. Not tachypneic. No respiratory distress. She has no decreased breath sounds. She has no wheezes. She has no rhonchi. She has no rales. She exhibits no tenderness.  Musculoskeletal: Normal range of motion. She exhibits no edema and no tenderness.  Lymphadenopathy:    She has no cervical adenopathy.  Neurological: She is alert and oriented to person, place, and time. No cranial nerve deficit. She exhibits normal muscle tone. Coordination normal.  Skin: Skin is warm and dry. No rash noted. She is not diaphoretic. No erythema. No pallor.  Psychiatric: Her behavior is normal. Judgment and thought content normal. Her mood appears anxious.          Assessment & Plan:

## 2013-08-10 NOTE — Assessment & Plan Note (Signed)
Persistent symptoms of anxiety. Pt tearful today. Encouraged her to consider counseling, which she declines. Will continue prn alprazolam.

## 2013-08-10 NOTE — Assessment & Plan Note (Signed)
BP Readings from Last 3 Encounters:  08/10/13 120/80  08/08/13 150/90  08/04/13 124/74   BP much improved today. Will continue Metoprolol.

## 2013-08-18 ENCOUNTER — Telehealth: Payer: Self-pay | Admitting: *Deleted

## 2013-08-18 ENCOUNTER — Other Ambulatory Visit: Payer: Self-pay | Admitting: Adult Health

## 2013-08-18 ENCOUNTER — Encounter: Payer: Self-pay | Admitting: Adult Health

## 2013-08-18 ENCOUNTER — Ambulatory Visit (INDEPENDENT_AMBULATORY_CARE_PROVIDER_SITE_OTHER): Payer: Medicare PPO | Admitting: Adult Health

## 2013-08-18 VITALS — BP 110/78 | HR 73 | Temp 98.5°F | Resp 14 | Wt 175.0 lb

## 2013-08-18 DIAGNOSIS — J0100 Acute maxillary sinusitis, unspecified: Secondary | ICD-10-CM

## 2013-08-18 MED ORDER — MOXIFLOXACIN HCL 400 MG PO TABS: 400.0000 mg | ORAL_TABLET | Freq: Every day | ORAL | Status: DC

## 2013-08-18 MED ORDER — LEVOFLOXACIN 500 MG PO TABS: 500.0000 mg | ORAL_TABLET | Freq: Every day | ORAL | Status: DC

## 2013-08-18 MED ORDER — FLUTICASONE PROPIONATE 50 MCG/ACT NA SUSP: 2.0000 | Freq: Every day | NASAL | Status: DC

## 2013-08-18 NOTE — Telephone Encounter (Signed)
Patient said medication you sent in is 45.00 dollars she wants to know can you send in something cheaper

## 2013-08-18 NOTE — Assessment & Plan Note (Signed)
Pt is allergic to PCN. Start Moxifloxacin

## 2013-08-18 NOTE — Progress Notes (Signed)
  Subjective:    Patient ID: Heather Beasley, female    DOB: [birth year], 57 y.o.   MRN: [id number]  HPI  Patient is a pleasant 57 year old female who presents to clinic with complaints of maxillary sinus pain mainly on the left side. She reports that when she blows her nose she sometimes has blood. She has sinus pressure and congestion. Denies fever or chills. She is a smoker.   Current Outpatient Prescriptions on File Prior to Visit  Medication Sig Dispense Refill  . albuterol (PROVENTIL HFA;VENTOLIN HFA) 108 (90 BASE) MCG/ACT inhaler Inhale 2 puffs into the lungs every 6 (six) hours as needed.      Marland Kitchen aspirin EC 81 MG tablet Take 81 mg by mouth daily.      Marland Kitchen atorvastatin (LIPITOR) 20 MG tablet Take 1 tablet (20 mg total) by mouth daily.  30 tablet  11  . cetirizine (ZYRTEC) 10 MG tablet Take 10 mg by mouth daily as needed. Allergies.      . cholecalciferol (VITAMIN D) 1000 UNITS tablet Take 3,000 Units by mouth daily.      Marland Kitchen gabapentin (NEURONTIN) 300 MG capsule Take 300 mg by mouth 3 (three) times daily.       Marland Kitchen imipramine (TOFRANIL) 25 MG tablet Take 25 mg by mouth at bedtime.      . metoprolol succinate (TOPROL-XL) 25 MG 24 hr tablet Take 1 tablet (25 mg total) by mouth daily.  90 tablet  3  . ondansetron (ZOFRAN) 8 MG tablet Take 1 tablet (8 mg total) by mouth every 8 (eight) hours as needed for nausea.  90 tablet  0  . pantoprazole (PROTONIX) 40 MG tablet Take 1 tablet (40 mg total) by mouth 2 (two) times daily.  60 tablet  11  . ranitidine (ZANTAC) 150 MG tablet Take 1 tablet (150 mg total) by mouth 2 (two) times daily.  30 tablet  3  . terbinafine (LAMISIL) 250 MG tablet Take 250 mg by mouth at bedtime.       . ALPRAZolam (XANAX) 0.25 MG tablet Take 1 tablet (0.25 mg total) by mouth 3 (three) times daily as needed for sleep or anxiety.  90 tablet  0  . cyclobenzaprine (FLEXERIL) 10 MG tablet Take 10 mg by mouth 2 (two) times daily.        No current facility-administered medications  on file prior to visit.     Review of Systems  Constitutional: Negative for fever and chills.  HENT: Positive for congestion, postnasal drip, rhinorrhea, sinus pressure and sneezing. Negative for sore throat.   Respiratory: Negative.   Cardiovascular: Negative.        Objective:   Physical Exam  Constitutional: She is oriented to person, place, and time. She appears well-developed and well-nourished. No distress.  HENT:  Head: Normocephalic and atraumatic.  Pain with palpation over left maxillary sinus area  Cardiovascular: Normal rate and regular rhythm.   Pulmonary/Chest: Effort normal. No respiratory distress.  Neurological: She is alert and oriented to person, place, and time.  Skin: Skin is warm and dry.  Psychiatric: She has a normal mood and affect. Her behavior is normal. Judgment and thought content normal.          Assessment & Plan:

## 2013-08-18 NOTE — Patient Instructions (Signed)
  Continue using the simple saline solution.  Start flonase nasal spray - 2 sprays into each nostril daily.  Start moxifloxacin 400 mg daily for 5 days.  Tylenol for fever of general discomfort  Call if no improvement within 4-5 days.  Recommend that you do not smoke - smoking is a trigger for your symptoms.

## 2013-08-18 NOTE — Telephone Encounter (Signed)
Sent in levaquin

## 2013-08-24 ENCOUNTER — Encounter: Payer: Self-pay | Admitting: Neurology

## 2013-08-24 ENCOUNTER — Encounter: Payer: Self-pay | Admitting: Internal Medicine

## 2013-08-24 ENCOUNTER — Ambulatory Visit (INDEPENDENT_AMBULATORY_CARE_PROVIDER_SITE_OTHER): Payer: Medicare PPO | Admitting: Neurology

## 2013-08-24 VITALS — BP 106/72 | HR 80 | Temp 98.1°F | Ht 65.5 in | Wt 176.0 lb

## 2013-08-24 DIAGNOSIS — G43009 Migraine without aura, not intractable, without status migrainosus: Secondary | ICD-10-CM

## 2013-08-24 MED ORDER — AMITRIPTYLINE HCL 25 MG PO TABS: 12.5000 mg | ORAL_TABLET | Freq: Every day | ORAL | Status: DC

## 2013-08-24 NOTE — Patient Instructions (Addendum)
These are likely migraines.  1.  We will restart Elavil 25mg  tablets.  Take 1/2 tablet at bedtime.  Side effects include sleepiness or dizziness.  We are going to try this medication again, because you were previously being treated for a different type of headache.  Call in 6 weeks with an update.  2.  If you have a headache coming on, take at earliest onset.  Take Excedrin.  You should not take more than 2 to 3 days out of the week. 3.  Stop taking daily Tylenol.  This can cause daily headaches, like you had last year. 4.  Stop or limit daily caffeine intake (tea). 5.  Follow up in 3 months.  Call with questions or concerns.

## 2013-08-24 NOTE — Progress Notes (Signed)
NEUROLOGY CONSULTATION NOTE  Heather Beasley MRN: [id number] DOB: 07-20-1956  Referring provider: [name] Primary care provider: [name]  Reason for consult:  Headache  HISTORY OF PRESENT ILLNESS: Heather Beasley is a 57 year old left-handed woman with hypertension, anxiety, GERD, hyperlipidemia, and asthma who presents for headache.  She was previously followed by Dr. Denton Meek, who has since left [facility].  Records and images were personally reviewed where available.    Onset:  08/04/13 Location:  Bitemporal and top of head Quality:  Pounding and stabbing Intensity:  10/10 Aura:  No Associated symptoms:  Nausea, vomiting, slurred speech, right facial droop.  Denies photophobia, phonophobia or osmophobia Duration:  4 hours Frequency:  About once a week since initial onset.  The other two headaches did not present with the facial droop. Activity:  Able to move around the house but cannot run errands outside Triggers/exacerbating factors:  Stress. Relieving factors:  Nothing  Past abortive therapy:  Tylenol, Relpax (helped). Past preventative therapy: Elavil (taken for chronic daily headaches but was ineffective), propranolol (taken for HTN but became hypotensive).  Current abortive therapy:  alprazolam for anxiety, Tylenol (mildly effective) Current preventative therapy:  metoprolol  Caffeine:  a cup of coffee or tea daily Sleep hygiene:  Some good, some bad. Stress/depression:   Yes.  She has recently been under a lot of stress due to family issues regarding her son and his current separation from his wife and kids Personal history of migraine:  Yes.  Stopped about 25 years ago.  They were holocephalic headaches associated with nausea, vomiting and photophobia. Family history of headache:  Brother has headaches.  She has seen Dr. Modesto Charon in the past for headache, sensory changes and vertigo.  She has a family history of spinocerebellar ataxia, but Dr. Modesto Charon doubted  that she had it.  Vertigo seemed more consistent with BPPV and headaches may have been medication-overuse headaches secondary to Tylenol and caffeine.  Sensory symptoms seemed secondary to chronic radiculopathy from previous cervical and lumbar spine surgery.  On 08/04/13, she presented to the ED after waking up in the morning with severe headache, nausea and vomiting.  She reported right facial droop which resolved by the time she arrived at ED.  Later in the day, she endorsed slight slurred speech.  CT Head revealed chronic small vessel ischemic changes but no acute stroke or hemorrhage.  An LP was performed, which revealed post-traumatic tap, but no evidence for intracranial bleed or infection.  She was diagnosed with migraine.  She reportedly had a recent MRI of the brain that did not reveal acute stroke.  Images and report not currently available.  PAST MEDICAL HISTORY: Past Medical History  Diagnosis Date  . Asthma   . Diverticulosis   . Headaches, cluster   . Colon polyps   . IBS (irritable bowel syndrome)   . Allergic rhinitis   . Arthritis   . Cervical spondylosis   . Glaucoma   . Kidney stones   . Hyperlipidemia     PAST SURGICAL HISTORY: Past Surgical History  Procedure Laterality Date  . Appendectomy  1998  . Abdominal hysterectomy  1998  . Lumbar spine surgery  07/2004  . Vaginal delivery      x2  . Arm fx repair  2001  . Cervical fusion  2010  . Cholecystectomy  08/2011    Dr Renda Rolls  . Esophagogastroduodenoscopy  2012    Dr Markham Jordan  . Colonoscopy  Dr Markham Jordan  . Tonsillectomy  1968  . Cystoscopy      Dr. Lonna Cobb    MEDICATIONS: Current Outpatient Prescriptions on File Prior to Visit  Medication Sig Dispense Refill  . albuterol (PROVENTIL HFA;VENTOLIN HFA) 108 (90 BASE) MCG/ACT inhaler Inhale 2 puffs into the lungs every 6 (six) hours as needed.      . ALPRAZolam (XANAX) 0.25 MG tablet Take 1 tablet (0.25 mg total) by mouth 3 (three) times daily as  needed for sleep or anxiety.  90 tablet  0  . aspirin EC 81 MG tablet Take 81 mg by mouth daily.      Marland Kitchen atorvastatin (LIPITOR) 20 MG tablet Take 1 tablet (20 mg total) by mouth daily.  30 tablet  11  . cetirizine (ZYRTEC) 10 MG tablet Take 10 mg by mouth daily as needed. Allergies.      . cholecalciferol (VITAMIN D) 1000 UNITS tablet Take 3,000 Units by mouth daily.      . cyclobenzaprine (FLEXERIL) 10 MG tablet Take 10 mg by mouth 2 (two) times daily.       . diazepam (VALIUM) 5 MG tablet Take 5 mg by mouth every 6 (six) hours as needed for anxiety.      . fluticasone (FLONASE) 50 MCG/ACT nasal spray Place 2 sprays into the nose daily.  16 g  6  . gabapentin (NEURONTIN) 300 MG capsule Take 300 mg by mouth 3 (three) times daily.       Marland Kitchen imipramine (TOFRANIL) 25 MG tablet Take 25 mg by mouth at bedtime.      Marland Kitchen levofloxacin (LEVAQUIN) 500 MG tablet Take 1 tablet (500 mg total) by mouth daily.  5 tablet  0  . metoprolol succinate (TOPROL-XL) 25 MG 24 hr tablet Take 1 tablet (25 mg total) by mouth daily.  90 tablet  3  . ondansetron (ZOFRAN) 8 MG tablet Take 1 tablet (8 mg total) by mouth every 8 (eight) hours as needed for nausea.  90 tablet  0  . pantoprazole (PROTONIX) 40 MG tablet Take 1 tablet (40 mg total) by mouth 2 (two) times daily.  60 tablet  11  . ranitidine (ZANTAC) 150 MG tablet Take 1 tablet (150 mg total) by mouth 2 (two) times daily.  30 tablet  3  . terbinafine (LAMISIL) 250 MG tablet Take 250 mg by mouth at bedtime.        No current facility-administered medications on file prior to visit.    ALLERGIES: Allergies  Allergen Reactions  . Betadine [Povidone Iodine] Dermatitis and Rash    Topical betadine and iodine have this reaction with patient.  IV contrast is NOT a problem.  jkl  . Latex Dermatitis  . Amoxicillin     REACTION: rash  . Amoxicillin-Pot Clavulanate   . Chantix [Varenicline Tartrate]     Heart racing  . Erythromycin     REACTION: rash  . Sucralfate  Nausea And Vomiting    Heart racing Lightheaded     FAMILY HISTORY: Family History  Problem Relation Age of Onset  . Hypertension Mother   . Hyperlipidemia Mother   . Diabetes Mother   . Colon cancer Father   . Pancreatic cancer Father   . Cancer Father     Colon & Pancreatic  . Breast cancer Paternal Grandmother   . Diabetes Paternal Grandmother   . Coronary artery disease Paternal Grandmother   . Heart failure Paternal Grandmother   . Cancer Paternal Grandmother  breast  . Stroke Brother     SOCIAL HISTORY: History   Social History  . Marital Status: Married    Spouse Name: N/A    Number of Children: 2  . Years of Education: N/A   Occupational History  . Cashier    Social History Main Topics  . Smoking status: Current Every Day Smoker -- 0.70 packs/day for 39 years    Types: Cigarettes  . Smokeless tobacco: Current User     Comment: e cigs currently--trying to quit  . Alcohol Use: No  . Drug Use: No  . Sexual Activity: Not on file   Other Topics Concern  . Not on file   Social History Narrative   Lives in [place] with husband. Dog in home. Work - disabled for neck and back pain.    REVIEW OF SYSTEMS: Constitutional: No fevers, chills, or sweats, no generalized fatigue, change in appetite Eyes: No visual changes, double vision, eye pain Ear, nose and throat: No hearing loss, ear pain, nasal congestion, sore throat Cardiovascular: No chest pain, palpitations Respiratory:  No shortness of breath at rest or with exertion, wheezes GastrointestinaI: No nausea, vomiting, diarrhea, abdominal pain, fecal incontinence Genitourinary:  No dysuria, urinary retention or frequency Musculoskeletal:  No neck pain, back pain Integumentary: No rash, pruritus, skin lesions Neurological: as above Psychiatric: No depression, insomnia, anxiety Endocrine: No palpitations, fatigue, diaphoresis, mood swings, change in appetite, change in weight, increased  thirst Hematologic/Lymphatic:  No anemia, purpura, petechiae. Allergic/Immunologic: no itchy/runny eyes, nasal congestion, recent allergic reactions, rashes  PHYSICAL EXAM: Filed Vitals:   08/24/13 0924  BP: 106/72  Pulse: 80  Temp: 98.1 F (36.7 C)   General: No acute distress Head:  Normocephalic/atraumatic Neck: supple, no paraspinal tenderness, full range of motion Back: No paraspinal tenderness Heart: regular rate and rhythm Lungs: Clear to auscultation bilaterally. Vascular: No carotid bruits. Neurological Exam: Mental status: alert and oriented to person, place, and time, speech fluent and not dysarthric, language intact. Cranial nerves: CN I: not tested CN II: pupils equal, round and reactive to light, visual fields intact, fundi unremarkable. CN III, IV, VI:  full range of motion, no nystagmus, no ptosis CN V: facial sensation intact CN VII: upper and lower face symmetric CN VIII: hearing intact CN IX, X: gag intact, uvula midline CN XI: sternocleidomastoid and trapezius muscles intact CN XII: tongue midline Bulk & Tone: normal, no fasciculations. Motor: 5-/5 in left deltoid, 5-/5 left triceps, 5-/5 left grip, 4+/5 left hip flexion, 4/5 left ankle dorsiflexion Sensation: endorses reduced pinprick sensation in LUE and LE (secondary to spinal surgery) Deep Tendon Reflexes: 2+ throughout except absent in ankles Finger to nose testing: no dysmetria Gait: mildly wide-based but normal stride.  Difficulty walking on heels.  Able to walk in tandem. Romberg negative.  IMPRESSION: Migraine without aura.  One episode accompanied by reversible right facial droop.  PLAN: 1.  Will start Effexor ER 37.5mg  daily.  We cannot use propranolol due to side effects and I would like to stay away from topiramate due to history of kidney stones and current renal neoplasm.  I do not want to start a TCA (such as Elavil) due to prolonged QT interval, as seen on recent ECG earlier this  month. 2.  Take Excedrin for acute headache.  Would not use triptans given the focal stroke-like symptom of her initial headache.  Can't take NSAIDs due to GI problems. 3.  Stop daily Tylenol. 4.  Follow up in 3 months.  Thank you for allowing me to take part in the care of this patient.  Shon Millet, DO  CC:  Ronna Polio, MD

## 2013-09-14 ENCOUNTER — Ambulatory Visit: Payer: Self-pay | Admitting: Internal Medicine

## 2013-10-03 LAB — HM MAMMOGRAPHY: HM MAMMO: NORMAL

## 2013-10-04 ENCOUNTER — Encounter: Payer: Self-pay | Admitting: Internal Medicine

## 2013-10-06 ENCOUNTER — Ambulatory Visit: Payer: Self-pay | Admitting: Unknown Physician Specialty

## 2013-10-16 ENCOUNTER — Ambulatory Visit: Payer: Self-pay | Admitting: Podiatry

## 2013-11-14 ENCOUNTER — Encounter: Payer: Self-pay | Admitting: Internal Medicine

## 2013-11-14 ENCOUNTER — Telehealth: Payer: Self-pay | Admitting: *Deleted

## 2013-11-14 ENCOUNTER — Ambulatory Visit (INDEPENDENT_AMBULATORY_CARE_PROVIDER_SITE_OTHER): Payer: Medicare PPO | Admitting: Internal Medicine

## 2013-11-14 VITALS — BP 122/84 | HR 67 | Temp 98.2°F | Wt 166.0 lb

## 2013-11-14 DIAGNOSIS — K219 Gastro-esophageal reflux disease without esophagitis: Secondary | ICD-10-CM

## 2013-11-14 DIAGNOSIS — F411 Generalized anxiety disorder: Secondary | ICD-10-CM

## 2013-11-14 DIAGNOSIS — F419 Anxiety disorder, unspecified: Secondary | ICD-10-CM

## 2013-11-14 DIAGNOSIS — I10 Essential (primary) hypertension: Secondary | ICD-10-CM

## 2013-11-14 DIAGNOSIS — R1013 Epigastric pain: Secondary | ICD-10-CM | POA: Insufficient documentation

## 2013-11-14 MED ORDER — ALPRAZOLAM 0.25 MG PO TABS: 0.2500 mg | ORAL_TABLET | Freq: Three times a day (TID) | ORAL | Status: DC | PRN

## 2013-11-14 MED ORDER — ATENOLOL 25 MG PO TABS: 25.0000 mg | ORAL_TABLET | Freq: Every day | ORAL | Status: DC

## 2013-11-14 MED ORDER — PANTOPRAZOLE SODIUM 40 MG PO TBEC: 40.0000 mg | DELAYED_RELEASE_TABLET | Freq: Two times a day (BID) | ORAL | Status: DC

## 2013-11-14 NOTE — Assessment & Plan Note (Signed)
BP Readings from Last 3 Encounters:  11/14/13 122/84  08/24/13 106/72  08/18/13 110/78   BP well controlled on metoprolol, however pt prefers to change to Atenolol because cannot afford metoprolol. Will start Atenolol 25mg  daily. Pt will monitor BP at home and call if >140/90. Follow up 3 months or sooner as needed.

## 2013-11-14 NOTE — Telephone Encounter (Signed)
Patient informed and verbalized understanding

## 2013-11-14 NOTE — Progress Notes (Signed)
Pre-visit discussion using our clinic review tool. No additional management support is needed unless otherwise documented below in the visit note.  

## 2013-11-14 NOTE — Progress Notes (Signed)
Subjective:    Patient ID: Heather Beasley, female    DOB: May 06, 1956, 58 y.o.   MRN: [id number]  HPI 58YO female with anxiety, hypertension, tobacco abuse, GERD, presents for follow up.  Anxiety - symptoms recently exacerbated by issues with her son. She notes police are involved as her son has filed complaints against her which she states are false. She has been using Alprazolam at night to help with symptoms of anxiety and insomnia. She notes significant improvement with this. She also uses prn Valium during the day for extreme anxiety.  GERD - Recent worsening of symptoms with epigastric burning pain, food intolerance, nausea, weight loss. Had CT abdomen ordered by her GI physician. She is unsure of results. She notes they are planning on upper endoscopy.  Hypertension - Does not check BP at home. Would like to change from Metoprolol to Atenolol because of cost. Unable to afford metoprolol.  Outpatient Prescriptions Prior to Visit  Medication Sig Dispense Refill  . albuterol (PROVENTIL HFA;VENTOLIN HFA) 108 (90 BASE) MCG/ACT inhaler Inhale 2 puffs into the lungs every 6 (six) hours as needed.      Marland Kitchen aspirin EC 81 MG tablet Take 81 mg by mouth daily.      Marland Kitchen atorvastatin (LIPITOR) 20 MG tablet Take 1 tablet (20 mg total) by mouth daily.  30 tablet  11  . cetirizine (ZYRTEC) 10 MG tablet Take 10 mg by mouth daily as needed. Allergies.      . cholecalciferol (VITAMIN D) 1000 UNITS tablet Take 3,000 Units by mouth daily.      . cyclobenzaprine (FLEXERIL) 10 MG tablet Take 10 mg by mouth 2 (two) times daily.       . diazepam (VALIUM) 5 MG tablet Take 5 mg by mouth every 6 (six) hours as needed for anxiety.      . gabapentin (NEURONTIN) 300 MG capsule Take 300 mg by mouth 3 (three) times daily.       Marland Kitchen imipramine (TOFRANIL) 25 MG tablet Take 25 mg by mouth at bedtime.      . ondansetron (ZOFRAN) 8 MG tablet Take 1 tablet (8 mg total) by mouth every 8 (eight) hours as needed for nausea.  90  tablet  0  . ranitidine (ZANTAC) 150 MG tablet Take 1 tablet (150 mg total) by mouth 2 (two) times daily.  30 tablet  3  . ALPRAZolam (XANAX) 0.25 MG tablet Take 1 tablet (0.25 mg total) by mouth 3 (three) times daily as needed for sleep or anxiety.  90 tablet  0  . fluticasone (FLONASE) 50 MCG/ACT nasal spray Place 2 sprays into the nose daily.  16 g  6  . metoprolol succinate (TOPROL-XL) 25 MG 24 hr tablet Take 1 tablet (25 mg total) by mouth daily.  90 tablet  3  . pantoprazole (PROTONIX) 40 MG tablet Take 1 tablet (40 mg total) by mouth 2 (two) times daily.  60 tablet  11  . terbinafine (LAMISIL) 250 MG tablet Take 250 mg by mouth at bedtime.        No facility-administered medications prior to visit.   BP 122/84  Pulse 67  Temp(Src) 98.2 F (36.8 C) (Oral)  Wt 166 lb (75.297 kg)  SpO2 95%  Review of Systems  Constitutional: Positive for appetite change and unexpected weight change. Negative for fever, chills and fatigue.  HENT: Negative for congestion, ear pain, sinus pressure, sore throat, trouble swallowing and voice change.   Eyes: Negative for visual disturbance.  Respiratory: Negative for cough, shortness of breath, wheezing and stridor.   Cardiovascular: Negative for chest pain, palpitations and leg swelling.  Gastrointestinal: Positive for nausea and abdominal pain. Negative for vomiting, diarrhea, constipation, blood in stool, abdominal distention and anal bleeding.  Genitourinary: Negative for dysuria and flank pain.  Musculoskeletal: Negative for arthralgias, gait problem, myalgias and neck pain.  Skin: Negative for color change and rash.  Neurological: Negative for dizziness and headaches.  Hematological: Negative for adenopathy. Does not bruise/bleed easily.  Psychiatric/Behavioral: Positive for sleep disturbance and dysphoric mood. Negative for suicidal ideas. The patient is nervous/anxious.        Objective:   Physical Exam  Constitutional: She is oriented to  person, place, and time. She appears well-developed and well-nourished. No distress.  HENT:  Head: Normocephalic and atraumatic.  Right Ear: External ear normal.  Left Ear: External ear normal.  Nose: Nose normal.  Mouth/Throat: Oropharynx is clear and moist. No oropharyngeal exudate.  Eyes: Conjunctivae are normal. Pupils are equal, round, and reactive to light. Right eye exhibits no discharge. Left eye exhibits no discharge. No scleral icterus.  Neck: Normal range of motion. Neck supple. No tracheal deviation present. No thyromegaly present.  Cardiovascular: Normal rate, regular rhythm, normal heart sounds and intact distal pulses.  Exam reveals no gallop and no friction rub.   No murmur heard. Pulmonary/Chest: Effort normal and breath sounds normal. No accessory muscle usage. Not tachypneic. No respiratory distress. She has no decreased breath sounds. She has no wheezes. She has no rhonchi. She has no rales. She exhibits no tenderness.  Abdominal: Soft. Bowel sounds are normal. She exhibits no distension and no mass. There is tenderness (epigastric area). There is no rebound and no guarding.  Musculoskeletal: Normal range of motion. She exhibits no edema and no tenderness.  Lymphadenopathy:    She has no cervical adenopathy.  Neurological: She is alert and oriented to person, place, and time. No cranial nerve deficit. She exhibits normal muscle tone. Coordination normal.  Skin: Skin is warm and dry. No rash noted. She is not diaphoretic. No erythema. No pallor.  Psychiatric: She has a normal mood and affect. Her behavior is normal. Judgment and thought content normal.          Assessment & Plan:

## 2013-11-14 NOTE — Assessment & Plan Note (Signed)
Persistent symptoms of epigastric pain, nausea, food intolerance. Recent evaluation by GI at [facility]. Will request records on this. Will send testing for H. Pylori.

## 2013-11-14 NOTE — Assessment & Plan Note (Addendum)
Persistent symptoms of anxiety. Encouraged her to focus on her own wellbeing. Will continue alprazolam and valium prn. Discussed that these medications should not be taken together. Alprazolam is used at bedtime to initiate sleep only.

## 2013-11-14 NOTE — Telephone Encounter (Signed)
They are very similar medications. I would recommend that she NOT use them at the same time.

## 2013-11-14 NOTE — Telephone Encounter (Signed)
Patient forgot to ask something at her visit today. She would like to know if it is ok or safe for her to take Valium and Xanax at the same time? She has not been doing that but would like to make sure she is not doing anything dangerous.

## 2013-11-14 NOTE — Assessment & Plan Note (Signed)
Persistent symptoms. Recent evaluation with GI. Planning for upper endoscopy per pt report. Will continue Pantoprazole and Zantac. Will send testing for H.Pylori today.

## 2013-11-15 LAB — H. PYLORI BREATH TEST: H. pylori Breath Test: NEGATIVE

## 2013-11-24 ENCOUNTER — Ambulatory Visit: Payer: Medicare PPO | Admitting: Neurology

## 2013-11-28 ENCOUNTER — Telehealth: Payer: Self-pay | Admitting: Internal Medicine

## 2013-11-28 NOTE — Telephone Encounter (Signed)
Relevant patient education assigned to patient using Emmi. ° °

## 2013-11-29 ENCOUNTER — Telehealth: Payer: Self-pay | Admitting: Internal Medicine

## 2013-11-29 NOTE — Telephone Encounter (Signed)
Relevant patient education mailed to patient.  

## 2013-12-01 ENCOUNTER — Ambulatory Visit: Payer: Medicare PPO | Admitting: Neurology

## 2013-12-04 ENCOUNTER — Ambulatory Visit (INDEPENDENT_AMBULATORY_CARE_PROVIDER_SITE_OTHER): Payer: Medicare PPO | Admitting: Physician Assistant

## 2013-12-04 ENCOUNTER — Encounter: Payer: Self-pay | Admitting: Cardiovascular Disease

## 2013-12-04 VITALS — BP 112/77 | HR 72 | Ht 65.0 in | Wt 168.5 lb

## 2013-12-04 DIAGNOSIS — E785 Hyperlipidemia, unspecified: Secondary | ICD-10-CM

## 2013-12-04 DIAGNOSIS — R002 Palpitations: Secondary | ICD-10-CM

## 2013-12-04 DIAGNOSIS — F17200 Nicotine dependence, unspecified, uncomplicated: Secondary | ICD-10-CM

## 2013-12-04 DIAGNOSIS — I10 Essential (primary) hypertension: Secondary | ICD-10-CM | POA: Insufficient documentation

## 2013-12-04 DIAGNOSIS — R9431 Abnormal electrocardiogram [ECG] [EKG]: Secondary | ICD-10-CM | POA: Insufficient documentation

## 2013-12-04 DIAGNOSIS — R000 Tachycardia, unspecified: Secondary | ICD-10-CM

## 2013-12-04 NOTE — Assessment & Plan Note (Signed)
Cessation stressed. Tobacco cessation assistance discussed for >10 minutes. She is weaning with the use of e-cigarettes. Discussed the lack of long-term risk with this form of NRT. Offered nicotine patch taper, nicotine gum to help with mechanistic habituation. She elected to defer this visit, but will call if further assistance needed. She is allergic to Chantix.

## 2013-12-04 NOTE — Assessment & Plan Note (Signed)
Intermittent, nocturnal palpitations described as "heart pounding." She is minimally symptomatic with these episodes. Advised to take an extra 1/2beta blocker tablet PRN if these become bothersome.

## 2013-12-04 NOTE — Progress Notes (Signed)
Patient ID: Heather Beasley, female   DOB: 1956/01/21, 58 y.o.   MRN: [id number]   Date:  12/04/2013   ID:  Heather Beasley, Heather 01/04/1956, MRN [id number]  PCP:  Rica Mast, MD  Primary Cardiologist:  Jerilynn Mages. Fletcher Anon, MD   History of Present Illness: Heather Beasley is a 58 y.o. female w/ PMHx s/f ongoing tobacco abuse, HLD, HTN, anxiety and GERD who presents today for 4 month follow-up.   She was seen last year after c/o exertional chest pain and dyspnea. EKG revealed anterior TWIs. She underwent pharmacologic nuclear stress testing revealing no ischemia, preserved EF. 2D echo showed only mild MR. She had c/o unilateral leg swelling. LE dopplers were negative for DVT.   She feels well overall. She is being worked up for epigastric tenderness, aggravated by meals. She has regurgitated food boluses. EGD scheduled in the near future.   She denies exertional chest discomfort, DOE/SOB, PND, orthopnea, LE edema, weight gain. She does note occasional "heart pounding" upon laying down prior to falling asleep. No irregularity, tachy-palpitations, lightheadedness or syncope. These episodes are noticeable, but not bothersome.   She continues to smoke. She is using e-cigarettes as a temporary weaning measure. She is allergic to Chantix. Has not tried alternative nicotine replacement therapies previously (aside from e-cigs). No prior formal dx of COPD.  EKG: NSR, anterolateral TWIs, unchanged from prior tracings  Wt Readings from Last 3 Encounters:  12/04/13 168 lb 8 oz (76.431 kg)  11/14/13 166 lb (75.297 kg)  08/24/13 176 lb (79.833 kg)     Past Medical History  Diagnosis Date  . Asthma   . Diverticulosis   . Headaches, cluster   . Colon polyps   . IBS (irritable bowel syndrome)   . Allergic rhinitis   . Arthritis   . Cervical spondylosis   . Glaucoma   . Kidney stones   . Hyperlipidemia   . Hypertension   . Tobacco abuse     Current Outpatient Prescriptions  Medication Sig  Dispense Refill  . albuterol (PROVENTIL HFA;VENTOLIN HFA) 108 (90 BASE) MCG/ACT inhaler Inhale 2 puffs into the lungs every 6 (six) hours as needed.      . ALPRAZolam (XANAX) 0.25 MG tablet Take 1 tablet (0.25 mg total) by mouth 3 (three) times daily as needed for sleep or anxiety.  90 tablet  1  . aspirin EC 81 MG tablet Take 81 mg by mouth daily.      Marland Kitchen atorvastatin (LIPITOR) 20 MG tablet Take 1 tablet (20 mg total) by mouth daily.  30 tablet  11  . cetirizine (ZYRTEC) 10 MG tablet Take 10 mg by mouth daily as needed. Allergies.      . cholecalciferol (VITAMIN D) 1000 UNITS tablet Take 3,000 Units by mouth daily.      . cyclobenzaprine (FLEXERIL) 10 MG tablet Take 10 mg by mouth 2 (two) times daily.       . diazepam (VALIUM) 5 MG tablet Take 5 mg by mouth every 6 (six) hours as needed for anxiety.      . gabapentin (NEURONTIN) 300 MG capsule Take 300 mg by mouth 3 (three) times daily.       Marland Kitchen imipramine (TOFRANIL) 25 MG tablet Take 25 mg by mouth at bedtime.      . metoprolol succinate (TOPROL-XL) 25 MG 24 hr tablet Take 25 mg by mouth daily.      . ondansetron (ZOFRAN) 8 MG tablet Take 1 tablet (8 mg total) by mouth  every 8 (eight) hours as needed for nausea.  90 tablet  0  . pantoprazole (PROTONIX) 40 MG tablet Take 1 tablet (40 mg total) by mouth 2 (two) times daily.  60 tablet  11  . ranitidine (ZANTAC) 150 MG tablet Take 1 tablet (150 mg total) by mouth 2 (two) times daily.  30 tablet  3  . atenolol (TENORMIN) 25 MG tablet Take 1 tablet (25 mg total) by mouth daily.  90 tablet  3   No current facility-administered medications for this visit.    Allergies:    Allergies  Allergen Reactions  . Betadine [Povidone Iodine] Dermatitis and Rash    Topical betadine and iodine have this reaction with patient.  IV contrast is NOT a problem.  jkl  . Latex Dermatitis  . Amoxicillin     REACTION: rash  . Amoxicillin-Pot Clavulanate   . Chantix [Varenicline Tartrate]     Heart racing  .  Erythromycin     REACTION: rash  . Sucralfate Nausea And Vomiting    Heart racing Lightheaded     Social History:  The patient  reports that she has been smoking Cigarettes.  She has a 27.3 pack-year smoking history. She uses smokeless tobacco. She reports that she does not drink alcohol or use illicit drugs.   Family History:  Family History  Problem Relation Age of Onset  . Hypertension Mother   . Hyperlipidemia Mother   . Diabetes Mother   . Colon cancer Father   . Pancreatic cancer Father   . Cancer Father     Colon & Pancreatic  . Breast cancer Paternal Grandmother   . Diabetes Paternal Grandmother   . Coronary artery disease Paternal Grandmother   . Heart failure Paternal Grandmother   . Cancer Paternal Grandmother     breast  . Stroke Brother     Review of Systems: General: negative for chills, fever, night sweats or weight changes.  Cardiovascular: positive for palpitations, negative for chest pain, dyspnea on exertion, edema, orthopnea, paroxysmal nocturnal dyspnea or shortness of breath Dermatological: negative for rash Respiratory: negative for cough or wheezing Urologic: negative for hematuria Abdominal: negative for nausea, vomiting, diarrhea, bright red blood per rectum, melena, or hematemesis Neurologic: negative for visual changes, syncope, or dizziness All other systems reviewed and are otherwise negative except as noted above.  PHYSICAL EXAM: VS:  BP 112/77  Pulse 72  Ht 5\' 5"  (1.651 m)  Wt 168 lb 8 oz (76.431 kg)  BMI 28.04 kg/m2  Well nourished, well developed, in no acute distress HEENT: normal, PERRL Neck: no JVD or bruits Cardiac:  normal S1, S2; RRR; no murmur or gallops Lungs:  clear to auscultation bilaterally, no wheezing, rhonchi or rales Abd: epigastric tenderness to palpation, soft, nontender, no hepatomegaly, normoactive BS x 4 quads Ext: no edema, cyanosis or clubbing Skin: warm and dry, cap refill < 2 sec Neuro:  CNs 2-12 intact,  no focal abnormalities noted Musculoskeletal: strength and tone appropriate for age  Psych: normal affect

## 2013-12-04 NOTE — Assessment & Plan Note (Signed)
Continue statin. 

## 2013-12-04 NOTE — Patient Instructions (Signed)
You are doing well from a heart standpoint.   Please call us if you develop symptoms of chest discomfort or shortness of breath with exertion.   Continue taking atenolol as prescribed. You can try to take 1/2 tablet at nighttime for palpitations.   Please call our office or Dr. Gilford Rile if you would like to try nicotine patches.   We will see you as needed otherwise.

## 2013-12-04 NOTE — Assessment & Plan Note (Signed)
Metoprolol succinate switched to atenolol for cost purposes. She is fininshed Toprol-XL before filling the latter. Answered questions re: symptom monitoring, possible symptoms of hypotension.

## 2013-12-04 NOTE — Assessment & Plan Note (Signed)
Anterior TWIs unchanged from prior tracings. No evidence of ischemia by recent nuclear stress testing and TTE to date. She denies exertional chest pain or dyspnea. Epigastrium is tender to palpation. Aggravated by meals. She has associated regurgitation with this. GI work-up/EGD planned. Continue low-dose ASA given cardiac RFs. She will call if symptoms worsen.

## 2013-12-08 ENCOUNTER — Encounter: Payer: Self-pay | Admitting: Neurology

## 2013-12-08 ENCOUNTER — Ambulatory Visit (INDEPENDENT_AMBULATORY_CARE_PROVIDER_SITE_OTHER): Payer: Medicare PPO | Admitting: Neurology

## 2013-12-08 VITALS — BP 126/74 | HR 70 | Ht 65.0 in | Wt 168.0 lb

## 2013-12-08 DIAGNOSIS — G43009 Migraine without aura, not intractable, without status migrainosus: Secondary | ICD-10-CM

## 2013-12-08 MED ORDER — VENLAFAXINE HCL ER 37.5 MG PO CP24: ORAL_CAPSULE | ORAL | Status: DC

## 2013-12-08 NOTE — Progress Notes (Signed)
NEUROLOGY FOLLOW UP OFFICE NOTE  Heather Beasley IM:[id number]  HISTORY OF PRESENT ILLNESS: Heather Beasley is a 58 year old left-handed woman with hypertension, anxiety, GERD, hyperlipidemia, and asthma who follows up for migraine.  Records and images were personally reviewed where available.    UPDATE: Last visit, advised her to stop Tylenol as she was taking it daily.  I also wanted to start her on Effexor, but for some reason the prescription wasn't received by the pharmacy.  However, she did stop taking Tylenol and caffeine.  The frequency of headaches were reduced from a couple of times a week to 3 or 4 times a month.  She takes Excedrin, which helps, but takes a little white to abort the headache.  HISTORY: Onset:  08/04/13 Location:  Bitemporal and top of head Quality:  Pounding and stabbing Intensity:  10/10 Aura:  No Associated symptoms:  Nausea, vomiting, slurred speech, right facial droop.  Denies photophobia, phonophobia or osmophobia Duration:  4 hours Triggers/exacerbating factors:  Stress.  Past abortive therapy:  Tylenol, Relpax (helped). Past preventative therapy: Elavil (taken for chronic daily headaches but was ineffective), propranolol (taken for HTN but became hypotensive).  Current abortive therapy:  Excedrin Current preventative therapy:  None  Medications unable to use:   Propranolol:  Caused hypotension Topamax:  History of kidney stones and currently with renal mass being investigated. TCAs:  Prolonged QT interval Triptans:  Stroke-like facial weakness symptom with migraine  Caffeine: stopped Sleep hygiene:  Some good, some bad. Stress/depression:   Yes.  She has recently been under a lot of stress due to family issues regarding her son and his current separation from his wife and kids Personal history of migraine:  Yes.  Stopped about 25 years ago.  They were holocephalic headaches associated with nausea, vomiting and photophobia. Family history of  headache:  Brother has headaches.  She has seen Dr. Jacelyn Beasley in the past for headache, sensory changes and vertigo.  She has a family history of spinocerebellar ataxia, but Dr. Jacelyn Beasley doubted that she had it.  Vertigo seemed more consistent with BPPV and headaches may have been medication-overuse headaches secondary to Tylenol and caffeine.  Sensory symptoms seemed secondary to chronic radiculopathy from previous cervical and lumbar spine surgery.  On 08/04/13, she presented to the ED after waking up in the morning with severe headache, nausea and vomiting.  She reported right facial droop which resolved by the time she arrived at ED.  Later in the day, she endorsed slight slurred speech.  CT Head revealed chronic small vessel ischemic changes but no acute stroke or hemorrhage.  An LP was performed, which revealed post-traumatic tap, but no evidence for intracranial bleed or infection.  She was diagnosed with migraine.  She reportedly had a recent MRI of the brain that did not reveal acute stroke.  Images and report not currently available.  09/04/13 LABS:  AST 19, ALT 11, WBC 4.6, Hgb 13.7, HCT 41.8, PLT 190  PAST MEDICAL HISTORY: Past Medical History  Diagnosis Date  . Asthma   . Diverticulosis   . Headaches, cluster   . Colon polyps   . IBS (irritable bowel syndrome)   . Allergic rhinitis   . Arthritis   . Cervical spondylosis   . Glaucoma   . Kidney stones   . Hyperlipidemia   . Hypertension   . Tobacco abuse     MEDICATIONS: Current Outpatient Prescriptions on File Prior to Visit  Medication Sig Dispense Refill  .  albuterol (PROVENTIL HFA;VENTOLIN HFA) 108 (90 BASE) MCG/ACT inhaler Inhale 2 puffs into the lungs every 6 (six) hours as needed.      . ALPRAZolam (XANAX) 0.25 MG tablet Take 1 tablet (0.25 mg total) by mouth 3 (three) times daily as needed for sleep or anxiety.  90 tablet  1  . aspirin EC 81 MG tablet Take 81 mg by mouth daily.      Marland Kitchen atorvastatin (LIPITOR) 20 MG tablet  Take 1 tablet (20 mg total) by mouth daily.  30 tablet  11  . cetirizine (ZYRTEC) 10 MG tablet Take 10 mg by mouth daily as needed. Allergies.      . cholecalciferol (VITAMIN D) 1000 UNITS tablet Take 3,000 Units by mouth daily.      . cyclobenzaprine (FLEXERIL) 10 MG tablet Take 10 mg by mouth 2 (two) times daily.       . diazepam (VALIUM) 5 MG tablet Take 5 mg by mouth every 6 (six) hours as needed for anxiety.      . gabapentin (NEURONTIN) 300 MG capsule Take 300 mg by mouth 3 (three) times daily.       Marland Kitchen imipramine (TOFRANIL) 25 MG tablet Take 25 mg by mouth at bedtime.      . metoprolol succinate (TOPROL-XL) 25 MG 24 hr tablet Take 25 mg by mouth daily.      . ondansetron (ZOFRAN) 8 MG tablet Take 1 tablet (8 mg total) by mouth every 8 (eight) hours as needed for nausea.  90 tablet  0  . pantoprazole (PROTONIX) 40 MG tablet Take 1 tablet (40 mg total) by mouth 2 (two) times daily.  60 tablet  11  . ranitidine (ZANTAC) 150 MG tablet Take 1 tablet (150 mg total) by mouth 2 (two) times daily.  30 tablet  3  . atenolol (TENORMIN) 25 MG tablet Take 1 tablet (25 mg total) by mouth daily.  90 tablet  3   No current facility-administered medications on file prior to visit.    ALLERGIES: Allergies  Allergen Reactions  . Betadine [Povidone Iodine] Dermatitis and Rash    Topical betadine and iodine have this reaction with patient.  IV contrast is NOT a problem.  jkl  . Latex Dermatitis  . Amoxicillin     REACTION: rash  . Amoxicillin-Pot Clavulanate   . Chantix [Varenicline Tartrate]     Heart racing  . Erythromycin     REACTION: rash  . Sucralfate Nausea And Vomiting    Heart racing Lightheaded     FAMILY HISTORY: Family History  Problem Relation Age of Onset  . Hypertension Mother   . Hyperlipidemia Mother   . Diabetes Mother   . Colon cancer Father   . Pancreatic cancer Father   . Cancer Father     Colon & Pancreatic  . Breast cancer Paternal Grandmother   . Diabetes  Paternal Grandmother   . Coronary artery disease Paternal Grandmother   . Heart failure Paternal Grandmother   . Cancer Paternal Grandmother     breast  . Stroke Brother     SOCIAL HISTORY: History   Social History  . Marital Status: Married    Spouse Name: N/A    Number of Children: 2  . Years of Education: N/A   Occupational History  . Cashier    Social History Main Topics  . Smoking status: Current Every Day Smoker -- 0.70 packs/day for 39 years    Types: Cigarettes  . Smokeless tobacco: Current User  Comment: e cigs currently--trying to quit  . Alcohol Use: No  . Drug Use: No  . Sexual Activity: Not on file   Other Topics Concern  . Not on file   Social History Narrative   Lives in [place] with husband. Dog in home. Work - disabled for neck and back pain.    REVIEW OF SYSTEMS: Constitutional: No fevers, chills, or sweats, no generalized fatigue, change in appetite Eyes: No visual changes, double vision, eye pain Ear, nose and throat: No hearing loss, ear pain, nasal congestion, sore throat Cardiovascular: No chest pain, palpitations Respiratory:  No shortness of breath at rest or with exertion, wheezes GastrointestinaI: No nausea, vomiting, diarrhea, abdominal pain, fecal incontinence Genitourinary:  No dysuria, urinary retention or frequency Musculoskeletal:  No neck pain, back pain Integumentary: No rash, pruritus, skin lesions Neurological: as above Psychiatric: No depression, insomnia, anxiety Endocrine: No palpitations, fatigue, diaphoresis, mood swings, change in appetite, change in weight, increased thirst Hematologic/Lymphatic:  No anemia, purpura, petechiae. Allergic/Immunologic: no itchy/runny eyes, nasal congestion, recent allergic reactions, rashes  PHYSICAL EXAM: Filed Vitals:   12/08/13 0930  BP: 126/74  Pulse: 70   General: No acute distress Head:  Normocephalic/atraumatic  IMPRESSION: Migraine without aura.  One episode  accompanied by reversible right facial droop.  PLAN: 1.  Will start Effexor ER 37.5mg  daily for 1wk, then increased to 75mg  daily.  Call with update in 4 weeks.   2.  Follow up in 3 months.  30 minutes spent with patient, 100% spent counseling and coordinating care.  Metta Clines, DO  CC:  Ronette Deter, MD

## 2013-12-08 NOTE — Patient Instructions (Signed)
Start Effexor ER (venlafaxine) 37.5mg .  Take 1 pill at bedtime for 7 days, then 2 pills at bedtime.  Call in 4 weeks with update. Continue Excedrin and/or Tylenol for headache Follow up in 3 months.

## 2014-02-01 LAB — HM COLONOSCOPY

## 2014-02-13 ENCOUNTER — Ambulatory Visit (INDEPENDENT_AMBULATORY_CARE_PROVIDER_SITE_OTHER): Payer: Medicare PPO | Admitting: Internal Medicine

## 2014-02-13 ENCOUNTER — Encounter: Payer: Self-pay | Admitting: Internal Medicine

## 2014-02-13 VITALS — BP 100/68 | HR 70 | Temp 98.5°F | Wt 166.0 lb

## 2014-02-13 DIAGNOSIS — I10 Essential (primary) hypertension: Secondary | ICD-10-CM

## 2014-02-13 DIAGNOSIS — W57XXXA Bitten or stung by nonvenomous insect and other nonvenomous arthropods, initial encounter: Secondary | ICD-10-CM

## 2014-02-13 DIAGNOSIS — E785 Hyperlipidemia, unspecified: Secondary | ICD-10-CM

## 2014-02-13 DIAGNOSIS — F411 Generalized anxiety disorder: Secondary | ICD-10-CM

## 2014-02-13 DIAGNOSIS — T148 Other injury of unspecified body region: Secondary | ICD-10-CM

## 2014-02-13 LAB — COMPREHENSIVE METABOLIC PANEL
ALBUMIN: 3.8 g/dL (ref 3.5–5.2)
ALK PHOS: 81 U/L (ref 39–117)
ALT: 13 U/L (ref 0–35)
AST: 18 U/L (ref 0–37)
BUN: 12 mg/dL (ref 6–23)
CALCIUM: 9.6 mg/dL (ref 8.4–10.5)
CO2: 30 mEq/L (ref 19–32)
Chloride: 102 mEq/L (ref 96–112)
Creatinine, Ser: 0.8 mg/dL (ref 0.4–1.2)
GFR: 77.83 mL/min (ref >60.00)
Glucose, Bld: 68 mg/dL — ABNORMAL LOW (ref 70–99)
POTASSIUM: 4.9 meq/L (ref 3.5–5.1)
SODIUM: 140 meq/L (ref 135–145)
TOTAL PROTEIN: 6.6 g/dL (ref 6.0–8.3)
Total Bilirubin: 1.2 mg/dL (ref 0.3–1.2)

## 2014-02-13 LAB — MICROALBUMIN / CREATININE URINE RATIO
CREATININE, U: 142.4 mg/dL
MICROALB/CREAT RATIO: 0.5 mg/g (ref 0.0–30.0)
Microalb, Ur: 0.7 mg/dL (ref 0.0–1.9)

## 2014-02-13 LAB — LIPID PANEL
CHOL/HDL RATIO: 4
CHOLESTEROL: 170 mg/dL (ref 0–200)
HDL: 48.4 mg/dL (ref >39.00)
LDL CALC: 112 mg/dL — AB (ref 0–99)
Triglycerides: 48 mg/dL (ref 0.0–149.0)
VLDL: 9.6 mg/dL (ref 0.0–40.0)

## 2014-02-13 MED ORDER — DOXYCYCLINE HYCLATE 100 MG PO TABS: 100.0000 mg | ORAL_TABLET | Freq: Two times a day (BID) | ORAL | Status: DC

## 2014-02-13 NOTE — Progress Notes (Signed)
Pre visit review using our clinic review tool, if applicable. No additional management support is needed unless otherwise documented below in the visit note. 

## 2014-02-13 NOTE — Progress Notes (Signed)
Subjective:    Patient ID: Heather Beasley, female    DOB: [birth year], 58 y.o.   MRN: [id number]  HPI 58YO female presents for follow up. Tearful today describing recent stress at home. Son and daughter in law in custody battle. Grandson recently diagnosed with possible leukemia. She has recently started on Effexor but has not noted any improvement with this. Taking prn alprazolam for severe anxiety.  Also concerned about recent tick bite on left shoulder. Occurred Saturday. No fever, chills ,headache.  Review of Systems  Constitutional: Negative for fever, chills, appetite change, fatigue and unexpected weight change.  HENT: Negative for congestion, ear pain, sinus pressure, sore throat, trouble swallowing and voice change.   Eyes: Negative for visual disturbance.  Respiratory: Negative for cough, shortness of breath, wheezing and stridor.   Cardiovascular: Negative for chest pain, palpitations and leg swelling.  Gastrointestinal: Negative for nausea, vomiting, abdominal pain, diarrhea, constipation, blood in stool, abdominal distention and anal bleeding.  Genitourinary: Negative for dysuria and flank pain.  Musculoskeletal: Negative for arthralgias, gait problem, myalgias and neck pain.  Skin: Negative for color change and rash.  Neurological: Negative for dizziness and headaches.  Hematological: Negative for adenopathy. Does not bruise/bleed easily.  Psychiatric/Behavioral: Positive for sleep disturbance and dysphoric mood. Negative for suicidal ideas. The patient is nervous/anxious.        Objective:    BP 100/68  Pulse 70  Temp(Src) 98.5 F (36.9 C) (Oral)  Wt 166 lb (75.297 kg)  SpO2 96% Physical Exam  Constitutional: She is oriented to person, place, and time. She appears well-developed and well-nourished. No distress.  HENT:  Head: Normocephalic and atraumatic.  Right Ear: External ear normal.  Left Ear: External ear normal.  Nose: Nose normal.  Mouth/Throat:  Oropharynx is clear and moist. No oropharyngeal exudate.  Eyes: Conjunctivae are normal. Pupils are equal, round, and reactive to light. Right eye exhibits no discharge. Left eye exhibits no discharge. No scleral icterus.  Neck: Normal range of motion. Neck supple. No tracheal deviation present. No thyromegaly present.  Cardiovascular: Normal rate, regular rhythm, normal heart sounds and intact distal pulses.  Exam reveals no gallop and no friction rub.   No murmur heard. Pulmonary/Chest: Effort normal and breath sounds normal. No accessory muscle usage. Not tachypneic. No respiratory distress. She has no decreased breath sounds. She has no wheezes. She has no rhonchi. She has no rales. She exhibits no tenderness.  Musculoskeletal: Normal range of motion. She exhibits no edema and no tenderness.  Lymphadenopathy:    She has no cervical adenopathy.  Neurological: She is alert and oriented to person, place, and time. No cranial nerve deficit. She exhibits normal muscle tone. Coordination normal.  Skin: Skin is warm and dry. No rash noted. She is not diaphoretic. There is erythema. No pallor.     Psychiatric: Her speech is normal and behavior is normal. Judgment and thought content normal. Her mood appears anxious. She exhibits a depressed mood. She expresses no suicidal ideation.          Assessment & Plan:   Problem List Items Addressed This Visit   Anxiety state, unspecified - Primary     Offered support today. Encouraged her to continue with Effexor 75mg  daily as prescribed by Dr. Tomi Likens. Will also continue prn Alprazolam. Encouraged her to consider counseling.    Essential hypertension, benign      BP Readings from Last 3 Encounters:  02/13/14 100/68  12/08/13 126/74  12/04/13 112/77  BP well controlled on atenolol. Will continue.    Relevant Orders      Microalbumin / creatinine urine ratio   Hyperlipidemia LDL goal < 100     Will check LFT and lipids with labs today.  Continue Atorvastatin.    Relevant Orders      Comprehensive metabolic panel      Lipid panel   Tick bite     Tick bite left shoulder. Minimal erythema at site. Will start doxycycline 100mg  po bid for mild early cellulitis. Follow up if erythema is worsening or if any fever, headache, or other concerns.    Relevant Medications      DOXYCYCLINE HYCLATE 100 MG PO TABS       Return in about 4 months (around 06/15/2014) for Physical.

## 2014-02-13 NOTE — Assessment & Plan Note (Signed)
Will check LFT and lipids with labs today. Continue Atorvastatin.

## 2014-02-13 NOTE — Assessment & Plan Note (Signed)
Offered support today. Encouraged her to continue with Effexor 75mg  daily as prescribed by Dr. Tomi Likens. Will also continue prn Alprazolam. Encouraged her to consider counseling.

## 2014-02-13 NOTE — Patient Instructions (Signed)
Start Doxcycline 100mg  twice daily. Call immediately if any worsening redness around tick bites or if any fever, headache.

## 2014-02-13 NOTE — Assessment & Plan Note (Signed)
Tick bite left shoulder. Minimal erythema at site. Will start doxycycline 100mg  po bid for mild early cellulitis. Follow up if erythema is worsening or if any fever, headache, or other concerns.

## 2014-02-13 NOTE — Assessment & Plan Note (Signed)
BP Readings from Last 3 Encounters:  02/13/14 100/68  12/08/13 126/74  12/04/13 112/77   BP well controlled on atenolol. Will continue.

## 2014-03-07 ENCOUNTER — Ambulatory Visit: Payer: Medicare PPO | Admitting: Neurology

## 2014-03-09 ENCOUNTER — Ambulatory Visit: Payer: Self-pay | Admitting: Unknown Physician Specialty

## 2014-03-13 LAB — PATHOLOGY REPORT

## 2014-03-14 ENCOUNTER — Encounter: Payer: Self-pay | Admitting: Neurology

## 2014-03-14 ENCOUNTER — Ambulatory Visit (INDEPENDENT_AMBULATORY_CARE_PROVIDER_SITE_OTHER): Payer: Medicare PPO | Admitting: Neurology

## 2014-03-14 VITALS — BP 114/76 | HR 76 | Ht 66.14 in | Wt 167.3 lb

## 2014-03-14 DIAGNOSIS — G43009 Migraine without aura, not intractable, without status migrainosus: Secondary | ICD-10-CM

## 2014-03-14 NOTE — Patient Instructions (Signed)
Continue on the Effexor ER 75mg  at bedtime for now.  Call in 4 weeks with an update and we can increase it to 150mg  if needed.  Follow up in 3 months.

## 2014-03-14 NOTE — Progress Notes (Addendum)
NEUROLOGY FOLLOW UP OFFICE NOTE  Heather Beasley [id number]  HISTORY OF PRESENT ILLNESS: Heather Beasley is a 58 year old left-handed woman with hypertension, anxiety, vertigo, GERD, hyperlipidemia, and asthma who follows up for migraine.  Records were personally reviewed.    UPDATE: Intensity:  8-9/10 Duration:  5-6 hours Frequency:  2x/month (1 every two weeks)  Current abortive therapy:  Excedrin Current preventative therapy:  Effexor ER 75mg  daily   Stress/depression:  Yes.  Son and daughter in law in custody battle.  Grandson recently has been worked up for leukemia.  Biopsies were negative.  Now being worked up for [employer] disease.  Recently treated with doxycycline for tick bite.  She had 3 spells that occurred about 3 to 4 weeks ago over the span of a week.  She developed sudden onset spinning sensation lasting 30 to 60 seconds.  They all occurred while standing.  No associated headache or focal symptoms.  She has not had any other spells.  HISTORY: Onset:  08/04/13 Location:  In middle of the head Quality:  Pounding and stabbing Intensity:  10/10 Aura:  No Prodrome:  no Associated symptoms:  Nausea, vomiting, slurred speech.  Denies photophobia, phonophobia or osmophobia.  Had right facial droop for initial attack on 08/04/13. Duration:  4 hours Fairly frequent, several times a week. Triggers/exacerbating factors:  Stress.  Past abortive therapy:  Tylenol, Relpax (helped). Past preventative therapy: Elavil (taken for chronic daily headaches but was ineffective), propranolol (taken for HTN but became hypotensive).  Medications unable to use:    Propranolol:  Caused hypotension Topamax:  History of kidney stones and currently with renal mass being investigated. TCAs:  Prolonged QT interval Triptans:  Stroke-like facial weakness symptom with migraine NSAIDs:  GERD, gastritis  Personal history of migraine:  Yes.  Stopped about 25 years ago.  They were holocephalic  headaches associated with nausea, vomiting and photophobia. Family history of headache:  Brother has headaches. She has a family history of spinocerebellar ataxia.  Her father had seizures.  MRI of the brain wo contrast was performed on 08/09/13, which revealed periventricular white matter disease consistent with small vessel ischemic changes but no acute or subacute stroke.  PAST MEDICAL HISTORY: Past Medical History  Diagnosis Date  . Asthma   . Diverticulosis   . Headaches, cluster   . Colon polyps   . IBS (irritable bowel syndrome)   . Allergic rhinitis   . Arthritis   . Cervical spondylosis   . Glaucoma   . Kidney stones   . Hyperlipidemia   . Hypertension   . Tobacco abuse     MEDICATIONS: Current Outpatient Prescriptions on File Prior to Visit  Medication Sig Dispense Refill  . albuterol (PROVENTIL HFA;VENTOLIN HFA) 108 (90 BASE) MCG/ACT inhaler Inhale 2 puffs into the lungs every 6 (six) hours as needed.      . ALPRAZolam (XANAX) 0.25 MG tablet Take 1 tablet (0.25 mg total) by mouth 3 (three) times daily as needed for sleep or anxiety.  90 tablet  1  . aspirin EC 81 MG tablet Take 81 mg by mouth daily.      Marland Kitchen atenolol (TENORMIN) 25 MG tablet Take 1 tablet (25 mg total) by mouth daily.  90 tablet  3  . atorvastatin (LIPITOR) 20 MG tablet Take 1 tablet (20 mg total) by mouth daily.  30 tablet  11  . cetirizine (ZYRTEC) 10 MG tablet Take 10 mg by mouth daily as needed. Allergies.      Marland Kitchen  cholecalciferol (VITAMIN D) 1000 UNITS tablet Take 3,000 Units by mouth daily.      . cyclobenzaprine (FLEXERIL) 10 MG tablet Take 10 mg by mouth 2 (two) times daily.       . diazepam (VALIUM) 5 MG tablet Take 5 mg by mouth every 6 (six) hours as needed for anxiety.      Marland Kitchen imipramine (TOFRANIL) 25 MG tablet Take 25 mg by mouth at bedtime.      . pantoprazole (PROTONIX) 40 MG tablet Take 1 tablet (40 mg total) by mouth 2 (two) times daily.  60 tablet  11  . ranitidine (ZANTAC) 150 MG tablet  Take 1 tablet (150 mg total) by mouth 2 (two) times daily.  30 tablet  3  . venlafaxine XR (EFFEXOR XR) 37.5 MG 24 hr capsule Take 1tab qhs x7d, then 2tabs qhs  60 capsule  0   No current facility-administered medications on file prior to visit.    ALLERGIES: Allergies  Allergen Reactions  . Betadine [Povidone Iodine] Dermatitis and Rash    Topical betadine and iodine have this reaction with patient.  IV contrast is NOT a problem.  jkl  . Latex Dermatitis  . Amoxicillin     REACTION: rash  . Amoxicillin-Pot Clavulanate   . Chantix [Varenicline Tartrate]     Heart racing  . Erythromycin     REACTION: rash  . Gabapentin   . Sucralfate Nausea And Vomiting    Heart racing Lightheaded     FAMILY HISTORY: Family History  Problem Relation Age of Onset  . Hypertension Mother   . Hyperlipidemia Mother   . Diabetes Mother   . Colon cancer Father   . Pancreatic cancer Father   . Cancer Father     Colon & Pancreatic  . Breast cancer Paternal Grandmother   . Diabetes Paternal Grandmother   . Coronary artery disease Paternal Grandmother   . Heart failure Paternal Grandmother   . Cancer Paternal Grandmother     breast  . Stroke Brother     SOCIAL HISTORY: History   Social History  . Marital Status: Married    Spouse Name: N/A    Number of Children: 2  . Years of Education: N/A   Occupational History  . Cashier    Social History Main Topics  . Smoking status: Current Every Day Smoker -- 0.70 packs/day for 39 years    Types: Cigarettes  . Smokeless tobacco: Current User     Comment: e cigs currently--trying to quit  . Alcohol Use: No  . Drug Use: No  . Sexual Activity: Not on file   Other Topics Concern  . Not on file   Social History Narrative   Lives in [place] with husband. Dog in home. Work - disabled for neck and back pain.    REVIEW OF SYSTEMS: Constitutional: No fevers, chills, or sweats.  Some fatigue. Eyes: No visual changes, double vision, eye  pain Ear, nose and throat: No hearing loss, ear pain, nasal congestion, sore throat Cardiovascular: No chest pain, palpitations Respiratory:  No shortness of breath at rest or with exertion, wheezes GastrointestinaI: No nausea, vomiting, diarrhea, abdominal pain, fecal incontinence Genitourinary:  No dysuria, urinary retention or frequency Musculoskeletal:  No neck pain, back pain Integumentary: Recent rash on the shoulder from Tick bite. Neurological: as above Psychiatric: Depression, stress Endocrine: No palpitations, fatigue, diaphoresis, mood swings, change in appetite, change in weight, increased thirst Hematologic/Lymphatic:  No anemia, purpura, petechiae. Allergic/Immunologic: no itchy/runny eyes, nasal  congestion, recent allergic reactions, rashes  PHYSICAL EXAM: Filed Vitals:   03/14/14 0918  BP: 114/76  Pulse: 76   General: No acute distress Head:  Normocephalic/atraumatic Neck: supple, no paraspinal tenderness, full range of motion Heart:  Regular rate and rhythm Lungs:  Clear to auscultation bilaterally Back: No paraspinal tenderness Neurological Exam: alert and oriented to person, place, and time. Attention span and concentration intact, recent and remote memory intact, fund of knowledge intact.  Speech fluent and not dysarthric, language intact.  CN II-XII intact. Fundoscopic exam unremarkable without vessel changes, exudates, hemorrhages or papilledema.  Bulk and tone normal, muscle strength 5/5 throughout.  Sensation to light touch intact.  Deep tendon reflexes 2+ throughout.  Finger to nose testing intact.  Gait normal, Romberg negative.  IMPRESSION: Migraine without aura. Isolated episode of right facial droop associated with migraine.  May be variant of migraine vs TIA.  PLAN: 1.  Continue Effexer ER 75mg  daily for another 4 weeks.  She is to call in 4 weeks with update and may increase to 150mg  if needed. 2.  Continue ASA daily 3.  Follow up in 3 months.  Metta Clines, DO  CC:  Ronette Deter, MD

## 2014-03-26 ENCOUNTER — Encounter: Payer: Self-pay | Admitting: Internal Medicine

## 2014-03-30 ENCOUNTER — Encounter: Payer: Self-pay | Admitting: Internal Medicine

## 2014-06-12 ENCOUNTER — Encounter: Payer: Medicare PPO | Admitting: Internal Medicine

## 2014-06-18 ENCOUNTER — Ambulatory Visit (INDEPENDENT_AMBULATORY_CARE_PROVIDER_SITE_OTHER): Payer: Medicare PPO | Admitting: Neurology

## 2014-06-18 ENCOUNTER — Encounter: Payer: Self-pay | Admitting: Neurology

## 2014-06-18 VITALS — BP 138/70 | HR 76 | Temp 97.0°F | Resp 18 | Wt 162.4 lb

## 2014-06-18 DIAGNOSIS — F329 Major depressive disorder, single episode, unspecified: Secondary | ICD-10-CM

## 2014-06-18 DIAGNOSIS — G43009 Migraine without aura, not intractable, without status migrainosus: Secondary | ICD-10-CM

## 2014-06-18 DIAGNOSIS — F32A Depression, unspecified: Secondary | ICD-10-CM

## 2014-06-18 DIAGNOSIS — F3289 Other specified depressive episodes: Secondary | ICD-10-CM

## 2014-06-18 MED ORDER — METOCLOPRAMIDE HCL 10 MG PO TABS: 10.0000 mg | ORAL_TABLET | Freq: Three times a day (TID) | ORAL | Status: DC | PRN

## 2014-06-18 NOTE — Patient Instructions (Signed)
1.  Continue the Effexor at 75mg  daily for now.  Call in the next month or so if headaches don't improve. 2.  For migraine attacks, try ibuprofen 400mg .  If ineffective, may repeat with metoclopramide 10mg  (an antiemetic) or just metoproclamide alone.  Side effects may include parkinson-type symptoms, but this shouldn't be a problem if you only take it episodically.  Combination with Effexor may (although not common) potentially cause serotonin syndrome or heart conduction problems, so watch out for symptoms such as confusion, sweating, palpitations, tremors (go to the ED if you do experience it). 3.  Follow up in 15months.

## 2014-06-18 NOTE — Progress Notes (Signed)
NEUROLOGY FOLLOW UP OFFICE NOTE  Heather Beasley [id number]  HISTORY OF PRESENT ILLNESS: Heather Beasley is a 58 year old left-handed woman with hypertension, anxiety, vertigo, GERD, hyperlipidemia, and asthma who follows up for migraine.    UPDATE: Following last visit, she was headache-free for about one month.  Then, headaches recurred.  She attributes this to stress related to the health of her grandson, who has recently been diagnosed with Gaucher's disease.  His doctor is in [place], so they have made multiple visits back and forth from there.  He will soon be starting therapy, requiring less visits, so hopefully the headaches will calm down. Intensity:  8/10 Duration:  12 hours Frequency: 1-2 days/week (6-8 headache days/month)  Current abortive therapy:  Excedrin Current preventative therapy:  Effexor ER 75mg  daily   HISTORY: Onset:  08/04/13 Location:  In middle of the head Quality:  Pounding and stabbing Initial intensity:  10/10: May 8-9/10 Aura:  No Prodrome:  no Associated symptoms:  Nausea, vomiting, slurred speech.  Denies photophobia, phonophobia or osmophobia.  Had right facial droop for initial attack on 08/04/13. Initial Duration:  4 hours; May 5-6 hours Initial frequency: several times a week; May 2x/month (1 every two weeks) Triggers/exacerbating factors:  Stress.  Past abortive therapy:  Tylenol, Relpax (helped). Past preventative therapy: Elavil (taken for chronic daily headaches but was ineffective), propranolol (taken for HTN but became hypotensive).  Medications unable to use:     Propranolol:  Caused hypotension Topamax:  History of kidney stones and currently with renal mass being investigated. TCAs:  Prolonged QT interval Triptans:  Stroke-like facial weakness symptom with migraine NSAIDs:  GERD, gastritis  Personal history of migraine:  Yes.  Stopped about 25 years ago.  They were holocephalic headaches associated with nausea, vomiting and  photophobia. Family history of headache:  Brother has headaches. She has a family history of spinocerebellar ataxia.  Her father had seizures.  On 08/04/13, she presented to the ED after waking up in the morning with severe headache, nausea and vomiting. She reported right facial droop which resolved by the time she arrived at ED. Later in the day, she endorsed slight slurred speech. CT Head revealed chronic small vessel ischemic changes but no acute stroke or hemorrhage. An LP was performed, which revealed post-traumatic tap, but no evidence for intracranial bleed or infection. She was diagnosed with migraine.  She reportedly had a recent MRI of the brain that did not reveal acute stroke. Images and report not currently available.  MRI of the brain wo contrast was performed on 08/09/13, which revealed periventricular white matter disease consistent with small vessel ischemic changes but no acute or subacute stroke.   PAST MEDICAL HISTORY: Past Medical History  Diagnosis Date  . Asthma   . Diverticulosis   . Headaches, cluster   . Colon polyps   . IBS (irritable bowel syndrome)   . Allergic rhinitis   . Arthritis   . Cervical spondylosis   . Glaucoma   . Kidney stones   . Hyperlipidemia   . Hypertension   . Tobacco abuse     MEDICATIONS: Current Outpatient Prescriptions on File Prior to Visit  Medication Sig Dispense Refill  . albuterol (PROVENTIL HFA;VENTOLIN HFA) 108 (90 BASE) MCG/ACT inhaler Inhale 2 puffs into the lungs every 6 (six) hours as needed.      . ALPRAZolam (XANAX) 0.25 MG tablet Take 1 tablet (0.25 mg total) by mouth 3 (three) times daily as needed for sleep  or anxiety.  90 tablet  1  . aspirin EC 81 MG tablet Take 81 mg by mouth daily.      Marland Kitchen atenolol (TENORMIN) 25 MG tablet Take 1 tablet (25 mg total) by mouth daily.  90 tablet  3  . atorvastatin (LIPITOR) 20 MG tablet Take 1 tablet (20 mg total) by mouth daily.  30 tablet  11  . cetirizine (ZYRTEC) 10 MG tablet  Take 10 mg by mouth daily as needed. Allergies.      . cholecalciferol (VITAMIN D) 1000 UNITS tablet Take 3,000 Units by mouth daily.      . cyclobenzaprine (FLEXERIL) 10 MG tablet Take 10 mg by mouth 2 (two) times daily.       . diazepam (VALIUM) 5 MG tablet Take 5 mg by mouth every 6 (six) hours as needed for anxiety.      Marland Kitchen imipramine (TOFRANIL) 25 MG tablet Take 25 mg by mouth at bedtime.      . pantoprazole (PROTONIX) 40 MG tablet Take 1 tablet (40 mg total) by mouth 2 (two) times daily.  60 tablet  11  . ranitidine (ZANTAC) 150 MG tablet Take 1 tablet (150 mg total) by mouth 2 (two) times daily.  30 tablet  3  . venlafaxine XR (EFFEXOR XR) 37.5 MG 24 hr capsule Take 1tab qhs x7d, then 2tabs qhs  60 capsule  0   No current facility-administered medications on file prior to visit.    ALLERGIES: Allergies  Allergen Reactions  . Betadine [Povidone Iodine] Dermatitis and Rash    Topical betadine and iodine have this reaction with patient.  IV contrast is NOT a problem.  jkl  . Latex Dermatitis  . Amoxicillin     REACTION: rash  . Amoxicillin-Pot Clavulanate   . Chantix [Varenicline Tartrate]     Heart racing  . Erythromycin     REACTION: rash  . Gabapentin   . Sucralfate Nausea And Vomiting    Heart racing Lightheaded     FAMILY HISTORY: Family History  Problem Relation Age of Onset  . Hypertension Mother   . Hyperlipidemia Mother   . Diabetes Mother   . Colon cancer Father   . Pancreatic cancer Father   . Cancer Father     Colon & Pancreatic  . Breast cancer Paternal Grandmother   . Diabetes Paternal Grandmother   . Coronary artery disease Paternal Grandmother   . Heart failure Paternal Grandmother   . Cancer Paternal Grandmother     breast  . Stroke Brother     SOCIAL HISTORY: History   Social History  . Marital Status: Married    Spouse Name: N/A    Number of Children: 2  . Years of Education: N/A   Occupational History  . Cashier    Social History  Main Topics  . Smoking status: Current Every Day Smoker -- 0.70 packs/day for 39 years    Types: Cigarettes  . Smokeless tobacco: Current User     Comment: e cigs currently--trying to quit  . Alcohol Use: No  . Drug Use: No  . Sexual Activity: Yes    Partners: Male   Other Topics Concern  . Not on file   Social History Narrative   Lives in [place] with husband. Dog in home. Work - disabled for neck and back pain.    REVIEW OF SYSTEMS: Constitutional: No fevers, chills, or sweats, no generalized fatigue, change in appetite Eyes: No visual changes, double vision, eye pain Ear,  nose and throat: No hearing loss, ear pain, nasal congestion, sore throat Cardiovascular: No chest pain, palpitations Respiratory:  No shortness of breath at rest or with exertion, wheezes GastrointestinaI: No nausea, vomiting, diarrhea, abdominal pain, fecal incontinence Genitourinary:  No dysuria, urinary retention or frequency Musculoskeletal:  No neck pain, back pain Integumentary: No rash, pruritus, skin lesions Neurological: as above Psychiatric: No depression, insomnia, anxiety Endocrine: No palpitations, fatigue, diaphoresis, mood swings, change in appetite, change in weight, increased thirst Hematologic/Lymphatic:  No anemia, purpura, petechiae. Allergic/Immunologic: no itchy/runny eyes, nasal congestion, recent allergic reactions, rashes  PHYSICAL EXAM: Filed Vitals:   06/18/14 0906  BP: 138/70  Pulse: 76  Temp: 97 F (36.1 C)  Resp: 18   General: No acute distress Head:  Normocephalic/atraumatic Neck: supple, no paraspinal tenderness, full range of motion Heart:  Regular rate and rhythm Lungs:  Clear to auscultation bilaterally Back: No paraspinal tenderness Neurological Exam: alert and oriented to person, place, and time. Attention span and concentration intact, recent and remote memory intact, fund of knowledge intact.  Speech fluent and not dysarthric, language intact.  CN II-XII  intact. Fundi not visualized.  Bulk and tone normal, muscle strength 5-/5 on the left, 5/5 on the right.  Sensation to pinprick and temperature reduced on the left upper and lower extremities.  Deep tendon reflexes 2+ throughout, toes downgoing.  Finger to nose intact.  Gait normal, Romberg negative.  IMPRESSION: Isolated episode of right facial droop associated with migraine.  May be variant of migraine vs TIA.  PLAN: 1.  Continue the Effexor at 75mg  daily for now.  Call in the next month or so if headaches don't improve. 2.  For migraine attacks, try ibuprofen 400mg  and see if stomach tolerates it better.  If ineffective, may repeat with metoclopramide 10mg  (an antiemetic) or just metoproclamide alone.  Side effects may include parkinson-type symptoms, but this shouldn't be a problem if you only take it episodically.  Combination with Effexor may (although not common) potentially cause serotonin syndrome or heart conduction problems was discussed. 3.  Follow up in 3 months.  Metta Clines, DO  CC:  Ronette Deter, MD

## 2014-07-04 ENCOUNTER — Encounter: Payer: Self-pay | Admitting: Internal Medicine

## 2014-07-04 ENCOUNTER — Ambulatory Visit (INDEPENDENT_AMBULATORY_CARE_PROVIDER_SITE_OTHER): Payer: Medicare PPO | Admitting: Internal Medicine

## 2014-07-04 VITALS — BP 122/84 | HR 70 | Temp 98.2°F | Ht 66.0 in | Wt 163.0 lb

## 2014-07-04 DIAGNOSIS — E785 Hyperlipidemia, unspecified: Secondary | ICD-10-CM

## 2014-07-04 DIAGNOSIS — Z0000 Encounter for general adult medical examination without abnormal findings: Secondary | ICD-10-CM

## 2014-07-04 DIAGNOSIS — F17200 Nicotine dependence, unspecified, uncomplicated: Secondary | ICD-10-CM

## 2014-07-04 DIAGNOSIS — Z1231 Encounter for screening mammogram for malignant neoplasm of breast: Secondary | ICD-10-CM | POA: Insufficient documentation

## 2014-07-04 LAB — VITAMIN D 25 HYDROXY (VIT D DEFICIENCY, FRACTURES): VITD: 31.67 ng/mL (ref 30.00–100.00)

## 2014-07-04 LAB — CBC WITH DIFFERENTIAL/PLATELET
BASOS ABS: 0 10*3/uL (ref 0.0–0.1)
BASOS PCT: 0.4 % (ref 0.0–3.0)
EOS PCT: 4.2 % (ref 0.0–5.0)
Eosinophils Absolute: 0.2 10*3/uL (ref 0.0–0.7)
HEMATOCRIT: 43.1 % (ref 36.0–46.0)
Hemoglobin: 14.3 g/dL (ref 12.0–15.0)
LYMPHS ABS: 1.3 10*3/uL (ref 0.7–4.0)
LYMPHS PCT: 28.6 % (ref 12.0–46.0)
MCHC: 33.1 g/dL (ref 30.0–36.0)
MCV: 95.9 fl (ref 78.0–100.0)
Monocytes Absolute: 0.3 10*3/uL (ref 0.1–1.0)
Monocytes Relative: 5.8 % (ref 3.0–12.0)
NEUTROS ABS: 2.8 10*3/uL (ref 1.4–7.7)
Neutrophils Relative %: 61 % (ref 43.0–77.0)
Platelets: 178 10*3/uL (ref 150.0–400.0)
RBC: 4.5 Mil/uL (ref 3.87–5.11)
RDW: 13.4 % (ref 11.5–15.5)
WBC: 4.6 10*3/uL (ref 4.0–10.5)

## 2014-07-04 LAB — LIPID PANEL
CHOL/HDL RATIO: 3
Cholesterol: 149 mg/dL (ref 0–200)
HDL: 47 mg/dL (ref >39.00)
LDL Cholesterol: 90 mg/dL (ref 0–99)
NonHDL: 102
TRIGLYCERIDES: 58 mg/dL (ref 0.0–149.0)
VLDL: 11.6 mg/dL (ref 0.0–40.0)

## 2014-07-04 LAB — COMPREHENSIVE METABOLIC PANEL
ALBUMIN: 3.8 g/dL (ref 3.5–5.2)
ALK PHOS: 90 U/L (ref 39–117)
ALT: 13 U/L (ref 0–35)
AST: 20 U/L (ref 0–37)
BUN: 9 mg/dL (ref 6–23)
CHLORIDE: 103 meq/L (ref 96–112)
CO2: 28 mEq/L (ref 19–32)
CREATININE: 0.7 mg/dL (ref 0.4–1.2)
Calcium: 9.1 mg/dL (ref 8.4–10.5)
GFR: 86.27 mL/min (ref >60.00)
GLUCOSE: 78 mg/dL (ref 70–99)
Potassium: 4.9 mEq/L (ref 3.5–5.1)
Sodium: 138 mEq/L (ref 135–145)
TOTAL PROTEIN: 7 g/dL (ref 6.0–8.3)
Total Bilirubin: 1 mg/dL (ref 0.2–1.2)

## 2014-07-04 LAB — MICROALBUMIN / CREATININE URINE RATIO
CREATININE, U: 96.4 mg/dL
Microalb Creat Ratio: 0.1 mg/g (ref 0.0–30.0)
Microalb, Ur: 0.1 mg/dL (ref 0.0–1.9)

## 2014-07-04 LAB — HM PAP SMEAR

## 2014-07-04 MED ORDER — CYCLOBENZAPRINE HCL 10 MG PO TABS: 10.0000 mg | ORAL_TABLET | Freq: Two times a day (BID) | ORAL | Status: DC

## 2014-07-04 MED ORDER — ATORVASTATIN CALCIUM 20 MG PO TABS: 20.0000 mg | ORAL_TABLET | Freq: Every day | ORAL | Status: DC

## 2014-07-04 NOTE — Progress Notes (Signed)
Pre visit review using our clinic review tool, if applicable. No additional management support is needed unless otherwise documented below in the visit note. 

## 2014-07-04 NOTE — Assessment & Plan Note (Signed)
General medical exam including breast exam normal today. PAP and pelvic exam deferred as pt s/p hysterectomy. Mammogram UTD. Colonoscopy UTD. Flu vaccine today. Encouraged healthy diet and exercise. Labs CBC, CMP,lipids.

## 2014-07-04 NOTE — Progress Notes (Signed)
The patient is here for annual Medicare Wellness Examination and management of other chronic and acute problems.   The risk factors are reflected in the history.  The roster of all physicians providing medical care to patient - is listed in the Snapshot section of the chart.  Activities of daily living:   The patient is 100% independent in all ADLs: dressing, toileting, feeding as well as independent mobility. Patient lives with husband. Dog in home. One story home. Has hardwood and carpeted floors.  Home safety :  The patient has smoke detectors in the home.  They wear seatbelts in their car. There are no firearms at home.  There is no violence in the home. They feel safe where they live.  Infectious Risks: There is no risks for hepatitis, STDs or HIV.  There is no  history of blood transfusion.  They have no travel history to infectious disease endemic areas of the world.  Additional Health Care Providers: The patient has seen their dentist in the last six months. Dentist - Dr. Quillian Quince They have seen their eye doctor in the last year. Opthalmologist - Dr. Lucius Conn, Dr. Newman Nip They deny hearing issues. They have deferred audiologic testing in the last year.   They do not  have excessive sun exposure. Discussed the need for sun protection: hats,long sleeves and use of sunscreen if there is significant sun exposure.  Dermatologist - none  Diet: the importance of a healthy diet is discussed. They do have a healthy diet.  The benefits of regular aerobic exercise were discussed. Exercise limited recently because of ongoing family issues.  Depression screen: there are no signs or vegative symptoms of depression- irritability, change in appetite, anhedonia, sadness/tearfullness.  Cognitive assessment: the patient manages all their financial and personal affairs and is actively engaged. They could relate day,date,year and events.  HCPOA - none   The following portions of the patient's  history were reviewed and updated as appropriate: allergies, current medications, past family history, past medical history,  past surgical history, past social history and problem list.  Visual acuity was not assessed per patient preference as they have regular follow up with their ophthalmologist. Hearing and body mass index were assessed and reviewed.   During the course of the visit the patient was educated and counseled about appropriate screening and preventive services including : fall prevention , diabetes screening, nutrition counseling, colorectal cancer screening, and recommended immunizations.    Review of Systems  Constitutional: Negative for fever, chills, appetite change, fatigue and unexpected weight change.  Eyes: Negative for visual disturbance.  Respiratory: Negative for shortness of breath.   Cardiovascular: Negative for chest pain and leg swelling.  Gastrointestinal: Negative for nausea, vomiting, abdominal pain, diarrhea and constipation.  Skin: Negative for color change and rash.  Hematological: Negative for adenopathy. Does not bruise/bleed easily.  Psychiatric/Behavioral: Negative for sleep disturbance and dysphoric mood. The patient is nervous/anxious.        Objective:    BP 122/84  Pulse 70  Temp(Src) 98.2 F (36.8 C) (Oral)  Ht 5\' 6"  (1.676 m)  Wt 163 lb (73.936 kg)  BMI 26.32 kg/m2  SpO2 93% Physical Exam  Constitutional: She is oriented to person, place, and time. She appears well-developed and well-nourished. No distress.  HENT:  Head: Normocephalic and atraumatic.  Right Ear: External ear normal.  Left Ear: External ear normal.  Nose: Nose normal.  Mouth/Throat: Oropharynx is clear and moist. No oropharyngeal exudate.  Eyes: Conjunctivae are normal. Pupils are  equal, round, and reactive to light. Right eye exhibits no discharge. Left eye exhibits no discharge. No scleral icterus.  Neck: Normal range of motion. Neck supple. No tracheal deviation  present. No thyromegaly present.  Cardiovascular: Normal rate, regular rhythm, normal heart sounds and intact distal pulses.  Exam reveals no gallop and no friction rub.   No murmur heard. Pulmonary/Chest: Effort normal and breath sounds normal. No accessory muscle usage. Not tachypneic. No respiratory distress. She has no decreased breath sounds. She has no wheezes. She has no rales. She exhibits no tenderness. Right breast exhibits no inverted nipple, no mass, no nipple discharge, no skin change and no tenderness. Left breast exhibits no inverted nipple, no mass, no nipple discharge, no skin change and no tenderness. Breasts are symmetrical.  Abdominal: Soft. Bowel sounds are normal. She exhibits no distension and no mass. There is no tenderness. There is no rebound and no guarding.  Musculoskeletal: Normal range of motion. She exhibits no edema and no tenderness.  Lymphadenopathy:    She has no cervical adenopathy.  Neurological: She is alert and oriented to person, place, and time. No cranial nerve deficit. She exhibits normal muscle tone. Coordination normal.  Skin: Skin is warm and dry. No rash noted. She is not diaphoretic. No erythema. No pallor.  Psychiatric: She has a normal mood and affect. Her behavior is normal. Judgment and thought content normal.          Assessment & Plan:   Problem List Items Addressed This Visit     Unprioritized   Medicare annual wellness visit, subsequent - Primary     General medical exam including breast exam normal today. PAP and pelvic exam deferred as pt s/p hysterectomy. Mammogram UTD. Colonoscopy UTD. Flu vaccine today. Encouraged healthy diet and exercise. Labs CBC, CMP,lipids.     Relevant Orders      CBC with Differential      Comprehensive metabolic panel      Lipid panel      Microalbumin / creatinine urine ratio      Vit D  25 hydroxy (rtn osteoporosis monitoring)   TOBACCO USE     Encouraged smoking cessation. Will set up screening CT  chest for lung cancer.    Relevant Orders      CT Chest Wo Contrast       No Follow-up on file.

## 2014-07-04 NOTE — Assessment & Plan Note (Signed)
Encouraged smoking cessation. Will set up screening CT chest for lung cancer.

## 2014-07-04 NOTE — Patient Instructions (Signed)

## 2014-07-18 ENCOUNTER — Telehealth: Payer: Self-pay

## 2014-07-18 NOTE — Telephone Encounter (Signed)
I don't know of a smoking cessation class to refer her to. She can call 1-800-quit-now to access resources through the state.

## 2014-07-18 NOTE — Telephone Encounter (Signed)
The patient called and is hoping to have a referral put in for a smoking cessation class/program.

## 2014-07-18 NOTE — Telephone Encounter (Signed)
Please notify pt

## 2014-07-18 NOTE — Telephone Encounter (Signed)
Spoke with pt, advised of MDs message 

## 2014-07-31 ENCOUNTER — Telehealth: Payer: Self-pay | Admitting: *Deleted

## 2014-07-31 DIAGNOSIS — F17200 Nicotine dependence, unspecified, uncomplicated: Secondary | ICD-10-CM

## 2014-07-31 NOTE — Telephone Encounter (Signed)
Order placed

## 2014-07-31 NOTE — Telephone Encounter (Signed)
Heather Beasley, Bear, needs an order for pt for a low dose CT scan for lung cancer screening.   Fax: [phone]

## 2014-08-02 ENCOUNTER — Telehealth: Payer: Self-pay | Admitting: Internal Medicine

## 2014-08-02 ENCOUNTER — Ambulatory Visit: Payer: Self-pay | Admitting: Internal Medicine

## 2014-08-02 DIAGNOSIS — I700 Atherosclerosis of aorta: Secondary | ICD-10-CM

## 2014-08-02 NOTE — Assessment & Plan Note (Signed)
Seen on CT scan 08/02/2014, thoracic aorta.

## 2014-08-02 NOTE — Telephone Encounter (Signed)
Chest CT showed pulmonary nodules with a benign appearance. They have recommended a follow up CT in 1 year.

## 2014-08-03 NOTE — Telephone Encounter (Signed)
Sent mychart message with results

## 2014-08-17 NOTE — Telephone Encounter (Signed)
Called and advised patient of results,  verbalized understanding. 

## 2014-08-17 NOTE — Telephone Encounter (Signed)
Left message for pt to return my call.

## 2014-08-21 ENCOUNTER — Encounter: Payer: Self-pay | Admitting: Internal Medicine

## 2014-09-19 ENCOUNTER — Ambulatory Visit: Payer: Medicare PPO | Admitting: Neurology

## 2014-09-24 ENCOUNTER — Telehealth: Payer: Self-pay | Admitting: *Deleted

## 2014-09-24 NOTE — Telephone Encounter (Signed)
Patients canceled her follow up appointment states she has flu like symptoms she will call and reschedule in a bout a week

## 2014-09-25 ENCOUNTER — Ambulatory Visit: Payer: Medicare PPO | Admitting: Neurology

## 2014-10-12 ENCOUNTER — Encounter: Payer: Self-pay | Admitting: *Deleted

## 2014-10-12 ENCOUNTER — Ambulatory Visit: Payer: Self-pay | Admitting: Internal Medicine

## 2014-10-12 LAB — HM MAMMOGRAPHY: HM MAMMO: NEGATIVE

## 2014-11-20 ENCOUNTER — Encounter: Payer: Self-pay | Admitting: Internal Medicine

## 2014-12-04 ENCOUNTER — Encounter: Payer: Self-pay | Admitting: Nurse Practitioner

## 2014-12-04 ENCOUNTER — Ambulatory Visit (INDEPENDENT_AMBULATORY_CARE_PROVIDER_SITE_OTHER): Payer: Medicare PPO | Admitting: Nurse Practitioner

## 2014-12-04 VITALS — BP 128/88 | HR 87 | Temp 98.1°F | Resp 14 | Ht 66.0 in | Wt 163.1 lb

## 2014-12-04 DIAGNOSIS — S0300XA Dislocation of jaw, unspecified side, initial encounter: Secondary | ICD-10-CM | POA: Insufficient documentation

## 2014-12-04 DIAGNOSIS — H9209 Otalgia, unspecified ear: Secondary | ICD-10-CM | POA: Insufficient documentation

## 2014-12-04 DIAGNOSIS — S0300XS Dislocation of jaw, unspecified side, sequela: Secondary | ICD-10-CM

## 2014-12-04 DIAGNOSIS — S030XXS Dislocation of jaw, sequela: Secondary | ICD-10-CM

## 2014-12-04 DIAGNOSIS — H9203 Otalgia, bilateral: Secondary | ICD-10-CM

## 2014-12-04 MED ORDER — FLUTICASONE PROPIONATE 50 MCG/ACT NA SUSP: 2.0000 | Freq: Every day | NASAL | Status: DC

## 2014-12-04 NOTE — Progress Notes (Signed)
Pre visit review using our clinic review tool, if applicable. No additional management support is needed unless otherwise documented below in the visit note. 

## 2014-12-04 NOTE — Assessment & Plan Note (Signed)
Worsening. Pt describing possibly TMJ referred pain to ears. Ear exam was normal. She is having an increased in allergies recently and will try Flonase to see if she gets relief. FU prn worsening/failure to improve.

## 2014-12-04 NOTE — Assessment & Plan Note (Signed)
Worsening. Pt grinds teeth at night, TMJ known on right, she points to the pre-auricular area when describing pain. TMJ pain can be mistaken as ear pain. Dentist appointment for filling in 2.5 weeks. Asked pt to talk to dentist about this and get his opinion. Will try flonase in the mean time. FU prn worsening/failure to improve.

## 2014-12-04 NOTE — Progress Notes (Signed)
Subjective:    Patient ID: Heather Beasley, female    DOB: [birth year], 59 y.o.   MRN: [id number]  HPI  [name] is a 59 yo female with a CC of ear pain.   1) Onset 3 days started in left ear and went to right ear. Shooting pains intermittently in left ear with dull pains the rest of the time and right ear dull pain. Pain wakes her up at night time. Shooting pain lasts only a second. Sneezing recently, believes it is allergy  related. Worst 6/10 for the shooting pain. Pointing to in front of her ear. Has history of TMJ on right she reports.  Zyrtec- helps sneezing Generic sudafed- helped   Review of Systems  Constitutional: Negative for fever, chills, diaphoresis and fatigue.  HENT: Positive for ear pain and sneezing. Negative for congestion, ear discharge, hearing loss, postnasal drip, rhinorrhea, sinus pressure, sore throat, tinnitus, trouble swallowing and voice change.   Respiratory: Negative for chest tightness, shortness of breath and wheezing.   Cardiovascular: Negative for chest pain, palpitations and leg swelling.  Gastrointestinal: Negative for nausea, vomiting and diarrhea.  Skin: Negative for rash.  Neurological: Negative for dizziness, weakness, numbness and headaches.  Psychiatric/Behavioral: The patient is not nervous/anxious.    Past Medical History  Diagnosis Date  . Asthma   . Diverticulosis   . Headaches, cluster   . Colon polyps   . IBS (irritable bowel syndrome)   . Allergic rhinitis   . Arthritis   . Cervical spondylosis   . Glaucoma   . Kidney stones   . Hyperlipidemia   . Hypertension   . Tobacco abuse     History   Social History  . Marital Status: Married    Spouse Name: N/A    Number of Children: 2  . Years of Education: N/A   Occupational History  . Cashier    Social History Main Topics  . Smoking status: Current Every Day Smoker -- 0.70 packs/day for 39 years    Types: Cigarettes  . Smokeless tobacco: Current User     Comment: e  cigs currently--trying to quit  . Alcohol Use: No  . Drug Use: No  . Sexual Activity:    Partners: Male   Other Topics Concern  . Not on file   Social History Narrative   Lives in [place] with husband. Dog in home. Work - disabled for neck and back pain.    Past Surgical History  Procedure Laterality Date  . Appendectomy  1998  . Abdominal hysterectomy  1998  . Lumbar spine surgery  07/2004  . Vaginal delivery      x2  . Arm fx repair  2001  . Cervical fusion  2010  . Cholecystectomy  08/2011    Dr Rochel Brome  . Esophagogastroduodenoscopy  2012    Dr Tiffany Kocher  . Colonoscopy      Dr Tiffany Kocher  . Tonsillectomy  1968  . Cystoscopy      Dr. Bernardo Heater    Family History  Problem Relation Age of Onset  . Hypertension Mother   . Hyperlipidemia Mother   . Diabetes Mother   . Colon cancer Father   . Pancreatic cancer Father   . Cancer Father     Colon & Pancreatic  . Breast cancer Paternal Grandmother   . Diabetes Paternal Grandmother   . Coronary artery disease Paternal Grandmother   . Heart failure Paternal Grandmother   . Cancer Paternal Grandmother  breast  . Stroke Brother     Allergies  Allergen Reactions  . Betadine [Povidone Iodine] Dermatitis and Rash    Topical betadine and iodine have this reaction with patient.  IV contrast is NOT a problem.  jkl  . Latex Dermatitis  . Amoxicillin     REACTION: rash  . Amoxicillin-Pot Clavulanate   . Chantix [Varenicline Tartrate]     Heart racing  . Erythromycin     REACTION: rash  . Gabapentin   . Sucralfate Nausea And Vomiting    Heart racing Lightheaded     Current Outpatient Prescriptions on File Prior to Visit  Medication Sig Dispense Refill  . albuterol (PROVENTIL HFA;VENTOLIN HFA) 108 (90 BASE) MCG/ACT inhaler Inhale 2 puffs into the lungs every 6 (six) hours as needed.    . ALPRAZolam (XANAX) 0.25 MG tablet Take 1 tablet (0.25 mg total) by mouth 3 (three) times daily as needed for sleep or anxiety.  90 tablet 1  . aspirin EC 81 MG tablet Take 81 mg by mouth daily.    Marland Kitchen atenolol (TENORMIN) 25 MG tablet Take 1 tablet (25 mg total) by mouth daily. 90 tablet 3  . cetirizine (ZYRTEC) 10 MG tablet Take 10 mg by mouth daily as needed. Allergies.    . cholecalciferol (VITAMIN D) 1000 UNITS tablet Take 3,000 Units by mouth daily.    . cyclobenzaprine (FLEXERIL) 10 MG tablet Take 1 tablet (10 mg total) by mouth 2 (two) times daily. 30 tablet 6  . pantoprazole (PROTONIX) 40 MG tablet Take 1 tablet (40 mg total) by mouth 2 (two) times daily. 60 tablet 11  . ranitidine (ZANTAC) 150 MG tablet Take 1 tablet (150 mg total) by mouth 2 (two) times daily. 30 tablet 3   No current facility-administered medications on file prior to visit.      Objective:   Physical Exam  Constitutional: She is oriented to person, place, and time. She appears well-developed and well-nourished. No distress.  BP 128/88 mmHg  Pulse 87  Temp(Src) 98.1 F (36.7 C) (Oral)  Resp 14  Ht 5\' 6"  (1.676 m)  Wt 163 lb 1.9 oz (73.991 kg)  BMI 26.34 kg/m2  SpO2 97%   HENT:  Head: Normocephalic and atraumatic.  Right Ear: External ear normal.  Left Ear: External ear normal.  Mouth/Throat: No oropharyngeal exudate.  Right TMJ present- pops out of joint  Pt grinds teeth- surfaces look as if grinding occurs TM's clear bilaterally  Eyes: EOM are normal. Pupils are equal, round, and reactive to light. Right eye exhibits no discharge. Left eye exhibits no discharge. No scleral icterus.  Neck: Normal range of motion. Neck supple. No thyromegaly present.  Cardiovascular: Normal rate, regular rhythm, normal heart sounds and intact distal pulses.  Exam reveals no gallop and no friction rub.   No murmur heard. Pulmonary/Chest: Effort normal and breath sounds normal. No respiratory distress. She has no wheezes. She has no rales. She exhibits no tenderness.  Lymphadenopathy:    She has no cervical adenopathy.  Neurological: She is alert  and oriented to person, place, and time. No cranial nerve deficit. She exhibits normal muscle tone. Coordination normal.  Skin: Skin is warm and dry. No rash noted. She is not diaphoretic.  Psychiatric: She has a normal mood and affect. Her behavior is normal. Judgment and thought content normal.         Assessment & Plan:

## 2014-12-04 NOTE — Patient Instructions (Addendum)
Try the flonase and ask your dentist about TMJ pain.   Call us if worsening or failing to improve.

## 2015-02-26 ENCOUNTER — Encounter: Payer: Self-pay | Admitting: Nurse Practitioner

## 2015-02-26 ENCOUNTER — Ambulatory Visit (INDEPENDENT_AMBULATORY_CARE_PROVIDER_SITE_OTHER): Payer: Medicare PPO | Admitting: Nurse Practitioner

## 2015-02-26 VITALS — BP 120/80 | HR 61 | Temp 97.8°F | Resp 12 | Ht 66.0 in | Wt 163.8 lb

## 2015-02-26 DIAGNOSIS — B029 Zoster without complications: Secondary | ICD-10-CM | POA: Diagnosis not present

## 2015-02-26 MED ORDER — VALACYCLOVIR HCL 1 G PO TABS: 1000.0000 mg | ORAL_TABLET | Freq: Three times a day (TID) | ORAL | Status: DC

## 2015-02-26 NOTE — Assessment & Plan Note (Signed)
Shingles rash beginning stages. Does not cross midline and is in a dermatomal distribution. Probable shingles. Asked pt to start benadryl at night for the itching and Valtrex 1,000 mg three x a day for 7 days. Call us for pain medications if tylenol not helpful.

## 2015-02-26 NOTE — Progress Notes (Signed)
Pre visit review using our clinic review tool, if applicable. No additional management support is needed unless otherwise documented below in the visit note. 

## 2015-02-26 NOTE — Patient Instructions (Signed)
Benadryl OTC at night.   Valacyclovir 3 x a day for 7 days.   Let us know if it is painful.   Shingles Shingles (herpes zoster) is an infection that is caused by the same virus that causes chickenpox (varicella). The infection causes a painful skin rash and fluid-filled blisters, which eventually break open, crust over, and heal. It may occur in any area of the body, but it usually affects only one side of the body or face. The pain of shingles usually lasts about 1 month. However, some people with shingles may develop long-term (chronic) pain in the affected area of the body. Shingles often occurs many years after the person had chickenpox. It is more common:  In people older than 50 years.  In people with weakened immune systems, such as those with HIV, AIDS, or cancer.  In people taking medicines that weaken the immune system, such as transplant medicines.  In people under great stress. CAUSES  Shingles is caused by the varicella zoster virus (VZV), which also causes chickenpox. After a person is infected with the virus, it can remain in the person's body for years in an inactive state (dormant). To cause shingles, the virus reactivates and breaks out as an infection in a nerve root. The virus can be spread from person to person (contagious) through contact with open blisters of the shingles rash. It will only spread to people who have not had chickenpox. When these people are exposed to the virus, they may develop chickenpox. They will not develop shingles. Once the blisters scab over, the person is no longer contagious and cannot spread the virus to others. SIGNS AND SYMPTOMS  Shingles shows up in stages. The initial symptoms may be pain, itching, and tingling in an area of the skin. This pain is usually described as burning, stabbing, or throbbing.In a few days or weeks, a painful red rash will appear in the area where the pain, itching, and tingling were felt. The rash is usually on one  side of the body in a band or belt-like pattern. Then, the rash usually turns into fluid-filled blisters. They will scab over and dry up in approximately 2-3 weeks. Flu-like symptoms may also occur with the initial symptoms, the rash, or the blisters. These may include:  Fever.  Chills.  Headache.  Upset stomach. DIAGNOSIS  Your health care provider will perform a skin exam to diagnose shingles. Skin scrapings or fluid samples may also be taken from the blisters. This sample will be examined under a microscope or sent to a lab for further testing. TREATMENT  There is no specific cure for shingles. Your health care provider will likely prescribe medicines to help you manage the pain, recover faster, and avoid long-term problems. This may include antiviral drugs, anti-inflammatory drugs, and pain medicines. HOME CARE INSTRUCTIONS   Take a cool bath or apply cool compresses to the area of the rash or blisters as directed. This may help with the pain and itching.   Take medicines only as directed by your health care provider.   Rest as directed by your health care provider.  Keep your rash and blisters clean with mild soap and cool water or as directed by your health care provider.  Do not pick your blisters or scratch your rash. Apply an anti-itch cream or numbing creams to the affected area as directed by your health care provider.  Keep your shingles rash covered with a loose bandage (dressing).  Avoid skin contact with:  Babies.   Pregnant women.   Children with eczema.   Elderly people with transplants.   People with chronic illnesses, such as leukemia or AIDS.   Wear loose-fitting clothing to help ease the pain of material rubbing against the rash.  Keep all follow-up visits as directed by your health care provider.If the area involved is on your face, you may receive a referral for a specialist, such as an eye doctor (ophthalmologist) or an ear, nose, and throat  (ENT) doctor. Keeping all follow-up visits will help you avoid eye problems, chronic pain, or disability.  SEEK IMMEDIATE MEDICAL CARE IF:   You have facial pain, pain around the eye area, or loss of feeling on one side of your face.  You have ear pain or ringing in your ear.  You have loss of taste.  Your pain is not relieved with prescribed medicines.   Your redness or swelling spreads.   You have more pain and swelling.  Your condition is worsening or has changed.   You have a fever. MAKE SURE YOU:  Understand these instructions.  Will watch your condition.  Will get help right away if you are not doing well or get worse. Document Released: 10/12/2005 Document Revised: 02/26/2014 Document Reviewed: 05/26/2012 [facility] Patient Information 2015 [place], [place]. This information is not intended to replace advice given to you by your health care provider. Make sure you discuss any questions you have with your health care provider.

## 2015-02-26 NOTE — Progress Notes (Signed)
   Subjective:    Patient ID: Heather Beasley, female    DOB: 01-09-1956, 59 y.o.   MRN: [id number]  HPI  [name] is a 59 yo female with a CC of rash on left torso 4-5 days.   1) Started itching a few days before rash popped up  No treatment to date Sore, burning sensation at site  2/10 pain currently   Review of Systems  Constitutional: Negative for fever, chills, diaphoresis and fatigue.  Skin: Positive for rash.      Objective:   Physical Exam  Constitutional: She is oriented to person, place, and time. She appears well-developed and well-nourished. No distress.  BP 120/80 mmHg  Pulse 61  Temp(Src) 97.8 F (36.6 C) (Oral)  Resp 12  Ht 5\' 6"  (1.676 m)  Wt 163 lb 12.8 oz (74.299 kg)  BMI 26.45 kg/m2  SpO2 97%   HENT:  Head: Normocephalic and atraumatic.  Right Ear: External ear normal.  Left Ear: External ear normal.  Cardiovascular: Normal rate and regular rhythm.   Pulmonary/Chest: Effort normal and breath sounds normal.  Abdominal:  Left torso midline to ribs.   Neurological: She is alert and oriented to person, place, and time.  Skin: Skin is warm and dry. Rash noted. She is not diaphoretic.  Papular rash (see abd for location), in a dermatomal distribution, no vesicles seen yet  Psychiatric: She has a normal mood and affect. Her behavior is normal. Judgment and thought content normal.       Assessment & Plan:

## 2015-04-19 DIAGNOSIS — J301 Allergic rhinitis due to pollen: Secondary | ICD-10-CM | POA: Diagnosis not present

## 2015-04-22 DIAGNOSIS — J301 Allergic rhinitis due to pollen: Secondary | ICD-10-CM | POA: Diagnosis not present

## 2015-04-25 DIAGNOSIS — J301 Allergic rhinitis due to pollen: Secondary | ICD-10-CM | POA: Diagnosis not present

## 2015-04-26 ENCOUNTER — Ambulatory Visit (INDEPENDENT_AMBULATORY_CARE_PROVIDER_SITE_OTHER): Payer: Medicare PPO | Admitting: Nurse Practitioner

## 2015-04-26 VITALS — BP 122/74 | HR 72 | Temp 98.1°F | Resp 14 | Ht 66.0 in | Wt 168.2 lb

## 2015-04-26 DIAGNOSIS — R600 Localized edema: Secondary | ICD-10-CM | POA: Diagnosis not present

## 2015-04-26 LAB — COMPREHENSIVE METABOLIC PANEL
ALT: 10 U/L (ref 0–35)
AST: 15 U/L (ref 0–37)
Albumin: 3.7 g/dL (ref 3.5–5.2)
Alkaline Phosphatase: 108 U/L (ref 39–117)
BUN: 10 mg/dL (ref 6–23)
CO2: 31 mEq/L (ref 19–32)
Calcium: 9.2 mg/dL (ref 8.4–10.5)
Chloride: 104 mEq/L (ref 96–112)
Creatinine, Ser: 0.75 mg/dL (ref 0.40–1.20)
GFR: 84.7 mL/min (ref >60.00)
Glucose, Bld: 116 mg/dL — ABNORMAL HIGH (ref 70–99)
Potassium: 4.7 mEq/L (ref 3.5–5.1)
Sodium: 143 mEq/L (ref 135–145)
Total Bilirubin: 0.6 mg/dL (ref 0.2–1.2)
Total Protein: 6.3 g/dL (ref 6.0–8.3)

## 2015-04-26 MED ORDER — FUROSEMIDE 20 MG PO TABS: 20.0000 mg | ORAL_TABLET | Freq: Every day | ORAL | Status: DC

## 2015-04-26 NOTE — Patient Instructions (Signed)
We will call in some Lasix (do not take more than 2 days in a row) if your levels are normal. Drink lots of water to stay hydrated and use the support hose.   Call us if no improvement in 7 days.

## 2015-04-26 NOTE — Progress Notes (Signed)
Pre visit review using our clinic review tool, if applicable. No additional management support is needed unless otherwise documented below in the visit note. 

## 2015-04-26 NOTE — Progress Notes (Signed)
   Subjective:    Patient ID: Heather Beasley, female    DOB: Mar 01, 1956, 59 y.o.   MRN: [id number]  HPI  Heather Beasley is a 59 yo female with a CC of leg swelling.   1) Legs achy, swelling of feet and ankles, some days worse than other since last week. Putting feet up with a pillow under her legs in a recliner. 3 pm 2 days ago took picture of feet very swollen and she had not been doing a lot of activity and reports having her feel elevated most of the day. Today the least amount of swelling. Not very painful, feels achy   Prednisone and levaquin per ENT for 2 weeks   Been off for 2 weeks.  Denies orthopnea, and SOB   Wt Readings from Last 3 Encounters:  04/26/15 168 lb 3.2 oz (76.295 kg)  02/26/15 163 lb 12.8 oz (74.299 kg)  12/04/14 163 lb 1.9 oz (73.991 kg)   Not eating any differently or exercise differently   Review of Systems  Constitutional: Negative for fever, chills, diaphoresis and fatigue.  Respiratory: Negative for chest tightness, shortness of breath and wheezing.   Cardiovascular: Positive for leg swelling. Negative for chest pain and palpitations.  Gastrointestinal: Negative for nausea, vomiting and diarrhea.  Musculoskeletal: Positive for myalgias.  Skin: Negative for rash.  Neurological: Negative for dizziness, weakness, numbness and headaches.  Psychiatric/Behavioral: The patient is not nervous/anxious.        Objective:   Physical Exam  Constitutional: She is oriented to person, place, and time. She appears well-developed and well-nourished. No distress.  BP 122/74 mmHg  Pulse 72  Temp(Src) 98.1 F (36.7 C)  Resp 14  Ht 5\' 6"  (1.676 m)  SpO2 94%  Wt 168 lbs   HENT:  Head: Normocephalic and atraumatic.  Right Ear: External ear normal.  Left Ear: External ear normal.  Cardiovascular: Normal rate, regular rhythm, normal heart sounds and intact distal pulses.  Exam reveals no gallop and no friction rub.   No murmur heard. Pulmonary/Chest: Effort normal  and breath sounds normal. No respiratory distress. She has no wheezes. She has no rales. She exhibits no tenderness.  Neurological: She is alert and oriented to person, place, and time. No cranial nerve deficit. She exhibits normal muscle tone. Coordination normal.  Skin: Skin is warm and dry. No rash noted. She is not diaphoretic.  Psychiatric: She has a normal mood and affect. Her behavior is normal. Judgment and thought content normal.          Assessment & Plan:

## 2015-05-02 DIAGNOSIS — J301 Allergic rhinitis due to pollen: Secondary | ICD-10-CM | POA: Diagnosis not present

## 2015-05-03 DIAGNOSIS — J301 Allergic rhinitis due to pollen: Secondary | ICD-10-CM | POA: Diagnosis not present

## 2015-05-06 DIAGNOSIS — J301 Allergic rhinitis due to pollen: Secondary | ICD-10-CM | POA: Diagnosis not present

## 2015-05-09 DIAGNOSIS — J301 Allergic rhinitis due to pollen: Secondary | ICD-10-CM | POA: Diagnosis not present

## 2015-05-10 ENCOUNTER — Encounter: Payer: Self-pay | Admitting: Nurse Practitioner

## 2015-05-10 NOTE — Assessment & Plan Note (Addendum)
Pt was seen by Dr. Fletcher Anon in 2014 for same. Will obtain CMET today to see if we can try a few days of Lasix. Also, encouraged support hose and elevation of legs. FU prn worsening/failure to improve.

## 2015-05-13 DIAGNOSIS — J301 Allergic rhinitis due to pollen: Secondary | ICD-10-CM | POA: Diagnosis not present

## 2015-05-16 DIAGNOSIS — J301 Allergic rhinitis due to pollen: Secondary | ICD-10-CM | POA: Diagnosis not present

## 2015-05-18 ENCOUNTER — Telehealth: Payer: Medicare PPO | Admitting: Physician Assistant

## 2015-05-18 ENCOUNTER — Encounter: Payer: Self-pay | Admitting: Internal Medicine

## 2015-05-18 DIAGNOSIS — L237 Allergic contact dermatitis due to plants, except food: Secondary | ICD-10-CM

## 2015-05-18 MED ORDER — PREDNISONE 10 MG (21) PO TBPK: ORAL_TABLET | ORAL | Status: DC

## 2015-05-18 NOTE — Progress Notes (Signed)

## 2015-05-23 DIAGNOSIS — J301 Allergic rhinitis due to pollen: Secondary | ICD-10-CM | POA: Diagnosis not present

## 2015-05-27 DIAGNOSIS — J301 Allergic rhinitis due to pollen: Secondary | ICD-10-CM | POA: Diagnosis not present

## 2015-05-28 DIAGNOSIS — J301 Allergic rhinitis due to pollen: Secondary | ICD-10-CM | POA: Diagnosis not present

## 2015-05-30 DIAGNOSIS — J301 Allergic rhinitis due to pollen: Secondary | ICD-10-CM | POA: Diagnosis not present

## 2015-06-03 DIAGNOSIS — J301 Allergic rhinitis due to pollen: Secondary | ICD-10-CM | POA: Diagnosis not present

## 2015-06-06 DIAGNOSIS — J301 Allergic rhinitis due to pollen: Secondary | ICD-10-CM | POA: Diagnosis not present

## 2015-06-10 DIAGNOSIS — J301 Allergic rhinitis due to pollen: Secondary | ICD-10-CM | POA: Diagnosis not present

## 2015-06-13 ENCOUNTER — Other Ambulatory Visit: Payer: Self-pay | Admitting: Unknown Physician Specialty

## 2015-06-13 DIAGNOSIS — Q019 Encephalocele, unspecified: Secondary | ICD-10-CM

## 2015-06-13 DIAGNOSIS — R0981 Nasal congestion: Secondary | ICD-10-CM | POA: Diagnosis not present

## 2015-06-13 DIAGNOSIS — R220 Localized swelling, mass and lump, head: Secondary | ICD-10-CM

## 2015-06-13 DIAGNOSIS — J309 Allergic rhinitis, unspecified: Secondary | ICD-10-CM | POA: Diagnosis not present

## 2015-06-13 DIAGNOSIS — J330 Polyp of nasal cavity: Secondary | ICD-10-CM | POA: Diagnosis not present

## 2015-06-17 DIAGNOSIS — J301 Allergic rhinitis due to pollen: Secondary | ICD-10-CM | POA: Diagnosis not present

## 2015-06-18 ENCOUNTER — Ambulatory Visit
Admission: RE | Admit: 2015-06-18 | Discharge: 2015-06-18 | Disposition: A | Discharge status: Home / Self Care | Payer: Medicare PPO | Source: Ambulatory Visit | Attending: Unknown Physician Specialty | Admitting: Unknown Physician Specialty

## 2015-06-18 DIAGNOSIS — J330 Polyp of nasal cavity: Secondary | ICD-10-CM | POA: Diagnosis not present

## 2015-06-18 DIAGNOSIS — R220 Localized swelling, mass and lump, head: Secondary | ICD-10-CM | POA: Diagnosis present

## 2015-06-18 DIAGNOSIS — R9082 White matter disease, unspecified: Secondary | ICD-10-CM | POA: Diagnosis not present

## 2015-06-18 DIAGNOSIS — Q019 Encephalocele, unspecified: Secondary | ICD-10-CM

## 2015-06-18 MED ORDER — GADOBENATE DIMEGLUMINE 529 MG/ML IV SOLN
20.0000 mL | Freq: Once | INTRAVENOUS | Status: AC | PRN
  Administered 2015-06-18: 15 mL via INTRAVENOUS

## 2015-06-20 DIAGNOSIS — J330 Polyp of nasal cavity: Secondary | ICD-10-CM | POA: Diagnosis not present

## 2015-06-20 DIAGNOSIS — J343 Hypertrophy of nasal turbinates: Secondary | ICD-10-CM | POA: Diagnosis not present

## 2015-06-24 ENCOUNTER — Other Ambulatory Visit: Payer: Self-pay | Admitting: *Deleted

## 2015-06-24 ENCOUNTER — Encounter
Admission: RE | Admit: 2015-06-24 | Discharge: 2015-06-24 | Disposition: A | Discharge status: Home / Self Care | Payer: Medicare PPO | Source: Ambulatory Visit | Attending: Unknown Physician Specialty | Admitting: Unknown Physician Specialty

## 2015-06-24 DIAGNOSIS — G629 Polyneuropathy, unspecified: Secondary | ICD-10-CM | POA: Diagnosis not present

## 2015-06-24 DIAGNOSIS — I739 Peripheral vascular disease, unspecified: Secondary | ICD-10-CM | POA: Diagnosis not present

## 2015-06-24 DIAGNOSIS — Z881 Allergy status to other antibiotic agents status: Secondary | ICD-10-CM | POA: Diagnosis not present

## 2015-06-24 DIAGNOSIS — Z8719 Personal history of other diseases of the digestive system: Secondary | ICD-10-CM | POA: Diagnosis not present

## 2015-06-24 DIAGNOSIS — F419 Anxiety disorder, unspecified: Secondary | ICD-10-CM | POA: Diagnosis not present

## 2015-06-24 DIAGNOSIS — Z86010 Personal history of colonic polyps: Secondary | ICD-10-CM | POA: Diagnosis not present

## 2015-06-24 DIAGNOSIS — K219 Gastro-esophageal reflux disease without esophagitis: Secondary | ICD-10-CM | POA: Diagnosis not present

## 2015-06-24 DIAGNOSIS — C300 Malignant neoplasm of nasal cavity: Secondary | ICD-10-CM | POA: Diagnosis not present

## 2015-06-24 DIAGNOSIS — Z7982 Long term (current) use of aspirin: Secondary | ICD-10-CM | POA: Diagnosis not present

## 2015-06-24 DIAGNOSIS — R040 Epistaxis: Secondary | ICD-10-CM | POA: Diagnosis not present

## 2015-06-24 DIAGNOSIS — Z91040 Latex allergy status: Secondary | ICD-10-CM | POA: Diagnosis not present

## 2015-06-24 DIAGNOSIS — Z888 Allergy status to other drugs, medicaments and biological substances status: Secondary | ICD-10-CM | POA: Diagnosis not present

## 2015-06-24 DIAGNOSIS — F329 Major depressive disorder, single episode, unspecified: Secondary | ICD-10-CM | POA: Diagnosis not present

## 2015-06-24 DIAGNOSIS — M47892 Other spondylosis, cervical region: Secondary | ICD-10-CM | POA: Diagnosis not present

## 2015-06-24 DIAGNOSIS — F17210 Nicotine dependence, cigarettes, uncomplicated: Secondary | ICD-10-CM | POA: Diagnosis not present

## 2015-06-24 DIAGNOSIS — J343 Hypertrophy of nasal turbinates: Secondary | ICD-10-CM | POA: Diagnosis not present

## 2015-06-24 DIAGNOSIS — J45909 Unspecified asthma, uncomplicated: Secondary | ICD-10-CM | POA: Diagnosis not present

## 2015-06-24 DIAGNOSIS — M1990 Unspecified osteoarthritis, unspecified site: Secondary | ICD-10-CM | POA: Diagnosis not present

## 2015-06-24 DIAGNOSIS — I10 Essential (primary) hypertension: Secondary | ICD-10-CM | POA: Diagnosis not present

## 2015-06-24 DIAGNOSIS — Z87442 Personal history of urinary calculi: Secondary | ICD-10-CM | POA: Diagnosis not present

## 2015-06-24 DIAGNOSIS — G119 Hereditary ataxia, unspecified: Secondary | ICD-10-CM | POA: Diagnosis not present

## 2015-06-24 HISTORY — DX: Major depressive disorder, single episode, unspecified: F32.9

## 2015-06-24 HISTORY — DX: Gastro-esophageal reflux disease without esophagitis: K21.9

## 2015-06-24 HISTORY — DX: Depression, unspecified: F32.A

## 2015-06-24 HISTORY — DX: Malignant (primary) neoplasm, unspecified: C80.1

## 2015-06-24 HISTORY — DX: Polyneuropathy, unspecified: G62.9

## 2015-06-24 LAB — CBC
HEMATOCRIT: 44.2 % (ref 35.0–47.0)
HEMOGLOBIN: 14.1 g/dL (ref 12.0–16.0)
MCH: 30.8 pg (ref 26.0–34.0)
MCHC: 32 g/dL (ref 32.0–36.0)
MCV: 96 fL (ref 80.0–100.0)
Platelets: 195 10*3/uL (ref 150–440)
RBC: 4.6 MIL/uL (ref 3.80–5.20)
RDW: 13.6 % (ref 11.5–14.5)
WBC: 4.9 10*3/uL (ref 3.6–11.0)

## 2015-06-24 LAB — DIFFERENTIAL
Basophils Absolute: 0 10*3/uL (ref 0–0.1)
Basophils Relative: 0 %
Eosinophils Absolute: 0.1 10*3/uL (ref 0–0.7)
Eosinophils Relative: 2 %
LYMPHS ABS: 1.3 10*3/uL (ref 1.0–3.6)
LYMPHS PCT: 25 %
MONO ABS: 0.3 10*3/uL (ref 0.2–0.9)
MONOS PCT: 7 %
NEUTROS ABS: 3.2 10*3/uL (ref 1.4–6.5)
Neutrophils Relative %: 66 %

## 2015-06-24 MED ORDER — ALBUTEROL SULFATE HFA 108 (90 BASE) MCG/ACT IN AERS: 2.0000 | INHALATION_SPRAY | Freq: Four times a day (QID) | RESPIRATORY_TRACT | Status: DC | PRN

## 2015-06-24 MED ORDER — PHENYLEPHRINE HCL 10 % OP SOLN: Freq: Once | OPHTHALMIC | Status: DC

## 2015-06-24 NOTE — Patient Instructions (Signed)
  Your procedure is scheduled VP:XTGGYI 30, 2016 (Tuesday) Report to Day Surgery. [facility]) To find out your arrival time please call [phone] between 1PM - 3PM on Today June 24, 2015 Remember: Instructions that are not followed completely may result in serious medical risk, up to and including death, or upon the discretion of your surgeon and anesthesiologist your surgery may need to be rescheduled.    __x__ 1. Do not eat food or drink liquids after midnight. No gum chewing or hard candies.     ____ 2. No Alcohol for 24 hours before or after surgery.   ____ 3. Bring all medications with you on the day of surgery if instructed.    __x__ 4. Notify your doctor if there is any change in your medical condition     (cold, fever, infections).     Do not wear jewelry, make-up, hairpins, clips or nail polish.  Do not wear lotions, powders, or perfumes. You may wear deodorant.  Do not shave 48 hours prior to surgery. Men may shave face and neck.  Do not bring valuables to the hospital.    [facility] is not responsible for any belongings or valuables.               Contacts, dentures or bridgework may not be worn into surgery.  Leave your suitcase in the car. After surgery it may be brought to your room.  For patients admitted to the hospital, discharge time is determined by your                treatment team.  Patients discharged the day of surgery will not be allowed to drive home.   Please read over the following fact sheets that you were given:   _x___ Take these medicines the morning of surgery with A SIP OF WATER:    1. Atenolol  2. Pantoprazole  3.   4.  5.  6.  ____ Fleet Enema (as directed)   ____ Use CHG Soap as directed  __x__ Use inhalers on the day of surgery (Albuterol inhaler and bring with you to the hospital) ____ Stop metformin 2 days prior to surgery    ____ Take 1/2 of usual insulin dose the night before surgery and none on the morning of surgery.    __x__ Stop Coumadin/Plavix/aspirin on (Patient stopped aspirin on June 19, 2015)  __x__ Stop Anti-inflammatories on (Patient stopped Excedrin three weeks ago)   ____ Stop supplements until after surgery.    ____ Bring C-Pap to the hospital.

## 2015-06-24 NOTE — Pre-Procedure Instructions (Signed)
EKG reviewed by Kerin Perna, RN (anesthesia nurse) and compared with previous EKG and there was no change.

## 2015-06-25 ENCOUNTER — Ambulatory Visit: Payer: Medicare PPO | Admitting: Anesthesiology

## 2015-06-25 ENCOUNTER — Encounter: Payer: Self-pay | Admitting: *Deleted

## 2015-06-25 ENCOUNTER — Encounter
Admission: RE | Disposition: A | Discharge status: Home / Self Care | Payer: Self-pay | Source: Ambulatory Visit | Attending: Unknown Physician Specialty

## 2015-06-25 ENCOUNTER — Ambulatory Visit
Admission: RE | Admit: 2015-06-25 | Discharge: 2015-06-25 | Disposition: A | Discharge status: Home / Self Care | Payer: Medicare PPO | Source: Ambulatory Visit | Attending: Unknown Physician Specialty | Admitting: Unknown Physician Specialty

## 2015-06-25 DIAGNOSIS — J45909 Unspecified asthma, uncomplicated: Secondary | ICD-10-CM | POA: Insufficient documentation

## 2015-06-25 DIAGNOSIS — Z87442 Personal history of urinary calculi: Secondary | ICD-10-CM | POA: Insufficient documentation

## 2015-06-25 DIAGNOSIS — I739 Peripheral vascular disease, unspecified: Secondary | ICD-10-CM | POA: Insufficient documentation

## 2015-06-25 DIAGNOSIS — C300 Malignant neoplasm of nasal cavity: Secondary | ICD-10-CM | POA: Diagnosis not present

## 2015-06-25 DIAGNOSIS — Z8719 Personal history of other diseases of the digestive system: Secondary | ICD-10-CM | POA: Insufficient documentation

## 2015-06-25 DIAGNOSIS — R040 Epistaxis: Secondary | ICD-10-CM | POA: Diagnosis not present

## 2015-06-25 DIAGNOSIS — Z86010 Personal history of colonic polyps: Secondary | ICD-10-CM | POA: Insufficient documentation

## 2015-06-25 DIAGNOSIS — F419 Anxiety disorder, unspecified: Secondary | ICD-10-CM | POA: Diagnosis not present

## 2015-06-25 DIAGNOSIS — G119 Hereditary ataxia, unspecified: Secondary | ICD-10-CM | POA: Insufficient documentation

## 2015-06-25 DIAGNOSIS — J343 Hypertrophy of nasal turbinates: Secondary | ICD-10-CM | POA: Diagnosis not present

## 2015-06-25 DIAGNOSIS — G629 Polyneuropathy, unspecified: Secondary | ICD-10-CM | POA: Insufficient documentation

## 2015-06-25 DIAGNOSIS — Z7982 Long term (current) use of aspirin: Secondary | ICD-10-CM | POA: Insufficient documentation

## 2015-06-25 DIAGNOSIS — Z91040 Latex allergy status: Secondary | ICD-10-CM | POA: Insufficient documentation

## 2015-06-25 DIAGNOSIS — Z888 Allergy status to other drugs, medicaments and biological substances status: Secondary | ICD-10-CM | POA: Insufficient documentation

## 2015-06-25 DIAGNOSIS — F329 Major depressive disorder, single episode, unspecified: Secondary | ICD-10-CM | POA: Diagnosis not present

## 2015-06-25 DIAGNOSIS — Z881 Allergy status to other antibiotic agents status: Secondary | ICD-10-CM | POA: Insufficient documentation

## 2015-06-25 DIAGNOSIS — M47892 Other spondylosis, cervical region: Secondary | ICD-10-CM | POA: Insufficient documentation

## 2015-06-25 DIAGNOSIS — I10 Essential (primary) hypertension: Secondary | ICD-10-CM | POA: Insufficient documentation

## 2015-06-25 DIAGNOSIS — K219 Gastro-esophageal reflux disease without esophagitis: Secondary | ICD-10-CM | POA: Insufficient documentation

## 2015-06-25 DIAGNOSIS — M1990 Unspecified osteoarthritis, unspecified site: Secondary | ICD-10-CM | POA: Insufficient documentation

## 2015-06-25 DIAGNOSIS — F17210 Nicotine dependence, cigarettes, uncomplicated: Secondary | ICD-10-CM | POA: Insufficient documentation

## 2015-06-25 DIAGNOSIS — J339 Nasal polyp, unspecified: Secondary | ICD-10-CM | POA: Diagnosis not present

## 2015-06-25 HISTORY — DX: Other specified postprocedural states: Z98.890

## 2015-06-25 HISTORY — DX: Nausea with vomiting, unspecified: R11.2

## 2015-06-25 HISTORY — PX: IMAGE GUIDED SINUS SURGERY: SHX6570

## 2015-06-25 HISTORY — DX: Noninfective gastroenteritis and colitis, unspecified: K52.9

## 2015-06-25 HISTORY — PX: POLYPECTOMY: SHX149

## 2015-06-25 SURGERY — SINUS SURGERY, WITH IMAGING GUIDANCE
Anesthesia: General | Site: Nose | Wound class: Clean Contaminated

## 2015-06-25 MED ORDER — OXYMETAZOLINE HCL 0.05 % NA SOLN
NASAL | Status: AC
  Administered 2015-06-25: 6 via NASAL
  Filled 2015-06-25: qty 15

## 2015-06-25 MED ORDER — LIDOCAINE-EPINEPHRINE 1 %-1:100000 IJ SOLN
INTRAMUSCULAR | Status: AC
  Filled 2015-06-25: qty 1

## 2015-06-25 MED ORDER — ONDANSETRON HCL 4 MG/2ML IJ SOLN
INTRAMUSCULAR | Status: DC | PRN
Start: 2015-06-25 — End: 2015-06-25
  Administered 2015-06-25: 4 mg via INTRAVENOUS

## 2015-06-25 MED ORDER — DEXAMETHASONE SODIUM PHOSPHATE 4 MG/ML IJ SOLN
INTRAMUSCULAR | Status: DC | PRN
  Administered 2015-06-25: 10 mg via INTRAVENOUS

## 2015-06-25 MED ORDER — PROPOFOL 10 MG/ML IV BOLUS
INTRAVENOUS | Status: DC | PRN
  Administered 2015-06-25: 170 mg via INTRAVENOUS

## 2015-06-25 MED ORDER — LIDOCAINE HCL 4 % EX SOLN
CUTANEOUS | Status: DC | PRN
  Administered 2015-06-25: 2 mL via NASAL

## 2015-06-25 MED ORDER — MIDAZOLAM HCL 2 MG/2ML IJ SOLN
INTRAMUSCULAR | Status: DC | PRN
  Administered 2015-06-25: 2 mg via INTRAVENOUS

## 2015-06-25 MED ORDER — FENTANYL CITRATE (PF) 100 MCG/2ML IJ SOLN: 25.0000 ug | INTRAMUSCULAR | Status: DC | PRN

## 2015-06-25 MED ORDER — SUGAMMADEX SODIUM 200 MG/2ML IV SOLN
INTRAVENOUS | Status: DC | PRN
  Administered 2015-06-25: 152.4 mg via INTRAVENOUS

## 2015-06-25 MED ORDER — ONDANSETRON HCL 4 MG/2ML IJ SOLN: 4.0000 mg | Freq: Once | INTRAMUSCULAR | Status: DC | PRN

## 2015-06-25 MED ORDER — LIDOCAINE-EPINEPHRINE 1 %-1:100000 IJ SOLN
INTRAMUSCULAR | Status: DC | PRN
  Administered 2015-06-25: 8 mL

## 2015-06-25 MED ORDER — PHENYLEPHRINE HCL 10 % OP SOLN
Freq: Once | OPHTHALMIC | Status: DC
  Filled 2015-06-25: qty 10

## 2015-06-25 MED ORDER — FENTANYL CITRATE (PF) 100 MCG/2ML IJ SOLN
INTRAMUSCULAR | Status: DC | PRN
  Administered 2015-06-25: 100 ug via INTRAVENOUS

## 2015-06-25 MED ORDER — LIDOCAINE HCL (CARDIAC) 20 MG/ML IV SOLN
INTRAVENOUS | Status: DC | PRN
  Administered 2015-06-25: 50 mg via INTRAVENOUS

## 2015-06-25 MED ORDER — ROCURONIUM BROMIDE 100 MG/10ML IV SOLN
INTRAVENOUS | Status: DC | PRN
  Administered 2015-06-25: 40 mg via INTRAVENOUS

## 2015-06-25 MED ORDER — HYDROCODONE-ACETAMINOPHEN 5-325 MG PO TABS: 1.0000 | ORAL_TABLET | Freq: Four times a day (QID) | ORAL | Status: DC | PRN

## 2015-06-25 MED ORDER — OXYMETAZOLINE HCL 0.05 % NA SOLN
6.0000 | Freq: Once | NASAL | Status: AC
  Administered 2015-06-25: 6 via NASAL

## 2015-06-25 MED ORDER — LACTATED RINGERS IV SOLN
INTRAVENOUS | Status: DC
  Administered 2015-06-25 (×2): via INTRAVENOUS

## 2015-06-25 SURGICAL SUPPLY — 23 items
BATTERY INSTRU NAVIGATION (MISCELLANEOUS) ×12 IMPLANT
CANISTER SUCT 1200ML W/VALVE (MISCELLANEOUS) ×4 IMPLANT
CNTNR SPEC 2.5X3XGRAD LEK (MISCELLANEOUS) ×4
COAG SUCT 10F 3.5MM HAND CTRL (MISCELLANEOUS) ×4 IMPLANT
CONT SPEC 4OZ STER OR WHT (MISCELLANEOUS) ×4
CONTAINER SPEC 2.5X3XGRAD LEK (MISCELLANEOUS) ×4 IMPLANT
CUP MEDICINE 2OZ PLAST GRAD ST (MISCELLANEOUS) ×8 IMPLANT
DRESSING NASL FOAM PST OP SINU (MISCELLANEOUS) ×2 IMPLANT
DRSG NASAL FOAM POST OP SINU (MISCELLANEOUS) ×4
GLOVE BIO SURGEON STRL SZ7.5 (GLOVE) ×4 IMPLANT
GOWN STRL REUS W/ TWL LRG LVL3 (GOWN DISPOSABLE) ×4 IMPLANT
GOWN STRL REUS W/TWL LRG LVL3 (GOWN DISPOSABLE) ×4
LABEL OR SOLS (LABEL) ×4 IMPLANT
NAVIGATION MASK REG  ST (MISCELLANEOUS) ×4 IMPLANT
NS IRRIG 500ML POUR BTL (IV SOLUTION) ×4 IMPLANT
PACK HEAD/NECK (MISCELLANEOUS) ×4 IMPLANT
PAD GROUND ADULT SPLIT (MISCELLANEOUS) ×4 IMPLANT
SOL ANTI-FOG 6CC FOG-OUT (MISCELLANEOUS) ×2 IMPLANT
SOL FOG-OUT ANTI-FOG 6CC (MISCELLANEOUS) ×2
SPLINT NASAL REUTER (MISCELLANEOUS) ×4 IMPLANT
SPONGE NEURO XRAY DETECT 1X3 (DISPOSABLE) ×4 IMPLANT
SWAB CULTURE AMIES ANAERIB BLU (MISCELLANEOUS) ×4 IMPLANT
WATER STERILE IRR 1000ML POUR (IV SOLUTION) ×4 IMPLANT

## 2015-06-25 NOTE — Transfer of Care (Signed)
Immediate Anesthesia Transfer of Care Note  Patient: Heather Beasley  Procedure(s) Performed: Procedure(s): IMAGE GUIDED SINUS SURGERY (N/A) POLYPECTOMY NASAL (N/A)  Patient Location: PACU  Anesthesia Type:General  Level of Consciousness: awake  Airway & Oxygen Therapy: Patient Spontanous Breathing and Patient connected to face mask oxygen  Post-op Assessment: Report given to RN  Post vital signs: Reviewed  Last Vitals:  Filed Vitals:   06/25/15 0925  BP: 126/77  Pulse: 72  Temp: 36.2 C  Resp: 20    Complications: No apparent anesthesia complications

## 2015-06-25 NOTE — H&P (Signed)
  H+P  Reviewed and will be scanned in later. No changes noted. 

## 2015-06-25 NOTE — Anesthesia Procedure Notes (Signed)
Procedure Name: Intubation Date/Time: 06/25/2015 8:36 AM Performed by: Rolla Plate Pre-anesthesia Checklist: Patient identified, Patient being monitored, Timeout performed, Emergency Drugs available and Suction available Patient Re-evaluated:Patient Re-evaluated prior to inductionOxygen Delivery Method: Circle system utilized Preoxygenation: Pre-oxygenation with 100% oxygen Intubation Type: IV induction Ventilation: Mask ventilation without difficulty Laryngoscope Size: Miller and 2 Grade View: Grade II Tube type: Oral Rae Tube size: 7.0 mm Number of attempts: 1 Airway Equipment and Method: Stylet Placement Confirmation: ETT inserted through vocal cords under direct vision,  positive ETCO2 and breath sounds checked- equal and bilateral Secured at: 20 cm Tube secured with: Tape Dental Injury: Teeth and Oropharynx as per pre-operative assessment

## 2015-06-25 NOTE — Anesthesia Preprocedure Evaluation (Signed)
Anesthesia Evaluation  Patient identified by MRN, date of birth, ID band Patient awake    Reviewed: Allergy & Precautions, NPO status , Patient's Chart, lab work & pertinent test results, reviewed documented beta blocker date and time   History of Anesthesia Complications (+) PONV and history of anesthetic complications  Airway Mallampati: III  TM Distance: >3 FB Neck ROM: Full    Dental no notable dental hx. (+) Caps   Pulmonary shortness of breath and with exertion, asthma , sleep apnea , Current Smoker,    Pulmonary exam normal       Cardiovascular hypertension, + Peripheral Vascular Disease Normal cardiovascular exam    Neuro/Psych  Headaches, PSYCHIATRIC DISORDERS Anxiety Depression Cerebellar ataxia, neuropathy    GI/Hepatic GERD-  Medicated and Controlled,Diverticulosis, colon polyps, IBS   Endo/Other    Renal/GU Kidney stones  negative genitourinary   Musculoskeletal  (+) Arthritis -, Osteoarthritis,  Cervical spondylosis   Abdominal Normal abdominal exam  (+)   Peds negative pediatric ROS (+)  Hematology   Anesthesia Other Findings   Reproductive/Obstetrics                             Anesthesia Physical Anesthesia Plan  ASA: III  Anesthesia Plan: General   Post-op Pain Management:    Induction: Intravenous  Airway Management Planned: Oral ETT  Additional Equipment:   Intra-op Plan:   Post-operative Plan: Extubation in OR  Informed Consent: I have reviewed the patients History and Physical, chart, labs and discussed the procedure including the risks, benefits and alternatives for the proposed anesthesia with the patient or authorized representative who has indicated his/her understanding and acceptance.   Dental advisory given  Plan Discussed with: CRNA and Surgeon  Anesthesia Plan Comments:         Anesthesia Quick Evaluation

## 2015-06-25 NOTE — Anesthesia Postprocedure Evaluation (Signed)
  Anesthesia Post-op Note  Patient: Heather Beasley  Procedure(s) Performed: Procedure(s): IMAGE GUIDED SINUS SURGERY (N/A) POLYPECTOMY NASAL (N/A)  Anesthesia type:General  Patient location: PACU  Post pain: Pain level controlled  Post assessment: Post-op Vital signs reviewed, Patient's Cardiovascular Status Stable, Respiratory Function Stable, Patent Airway and No signs of Nausea or vomiting  Post vital signs: Reviewed and stable  Last Vitals:  Filed Vitals:   06/25/15 0925  BP: 126/77  Pulse: 72  Temp: 36.2 C  Resp: 20    Level of consciousness: awake, alert  and patient cooperative  Complications: No apparent anesthesia complications

## 2015-06-25 NOTE — Discharge Instructions (Signed)
General Anesthesia, Care After Refer to this sheet in the next few weeks. These instructions provide you with information on caring for yourself after your procedure. Your health care provider may also give you more specific instructions. Your treatment has been planned according to current medical practices, but problems sometimes occur. Call your health care provider if you have any problems or questions after your procedure. WHAT TO EXPECT AFTER THE PROCEDURE After the procedure, it is typical to experience:  Sleepiness.  Nausea and vomiting. HOME CARE INSTRUCTIONS  For the first 24 hours after general anesthesia:  Have a responsible person with you.  Do not drive a car. If you are alone, do not take public transportation.  Do not drink alcohol.  Do not take medicine that has not been prescribed by your health care provider.  Do not sign important papers or make important decisions.  You may resume a normal diet and activities as directed by your health care provider.  Change bandages (dressings) as directed.  If you have questions or problems that seem related to general anesthesia, call the hospital and ask for the anesthetist or anesthesiologist on call. SEEK MEDICAL CARE IF:  You have nausea and vomiting that continue the day after anesthesia.  You develop a rash. SEEK IMMEDIATE MEDICAL CARE IF:   You have difficulty breathing.  You have chest pain.  You have any allergic problems. Document Released: 01/18/2001 Document Revised: 10/17/2013 Document Reviewed: 04/27/2013 [facility] Patient Information 2015 [place], [place]. This information is not intended to replace advice given to you by your health care provider. Make sure you discuss any questions you have with your health care provider. NASAL SURGERY POST-OPERATIVE PATIENT INSTRUCTIONS   WOUND CARE INSTRUCTIONS:    Do not blow your nose until instructed to do so.   Brush your teeth gently with a soft tooth bruch  only. Sleep with your head up and only in the supine position.  Don't turn your head side to side.    DIET:  You may eat any foods that you can tolerate.  It is a good idea to eat a high fiber diet and take in plenty of fluids to prevent constipation.  If you do become constipated you may want to take a mild laxative or take ducolax tablets on a daily basis until your bowel habits are regular.    MEDICATIONS:  Try to take narcotic medications and anti-inflammatory medications, such as tylenol, ibuprofen, naprosyn, etc., with food.  This will minimize stomach upset from the medication.  Should you develop nausea and vomiting from the pain medication, or develop a rash, please discontinue the medication and contact your physician.  You should not drive, make important decisions, or operate machinery when taking narcotic pain medication.

## 2015-06-25 NOTE — Op Note (Signed)
06/25/2015  9:18 AM    Heather Beasley    [id number]   Pre-Op Dx: EPISTAXIS, NASAL POLYPS  Post-op Dx: SAME  Proc: Use a Stryker navigation system, endoscopic right nasal polypectomy, bilateral endoscopic cautery of inferior turbinates.  Surg:  Beverly Gust T  Anes:  GOT  EBL:  Less than 20 mL   Comp:  None  Findings:  Bilateral inferior turbinate hypertrophy large right intranasal polypoid mass emanating from the superior aspect of the nasal septum on the right  Procedure: [name] was identified in the holding area and taken to the operating room placed in spot position. After general endotracheal anesthesia was induced the table was turned 90. The Stryker navigation device was applied and calibrated remained on throughout the case. The patient was in drape usual fashion for endoscopic sinus surgery. Topical anesthesia of phenylephrine and  lidocaine was placed on cottonoid pledgets within each nostril. Approximate 5 minutes this was removed. A local and acetic 1% lidocaine with 1 100,000 units of epinephrine was used to inject the inferior turbinates bilaterally and the nasal polyp on the right and the pain in the nasal septum on the right. A total of 4 mL was used patient drape usual fashion for sinus surgery 0 endoscope was introduced into the left nostril examination showed a polypoid mass was inferior turbinate hypertrophy. The right side was then examined skin is inferior turbinate hypertrophy more posteriorly there was a large polypoid mass emanating from the superior posterior aspect of the nasal septum using straight and 45 forceps the polypoid mass was removed in its entirety. This was sent for both permanent section and for frozen section. With the mesh removed from the septum the suction cautery was used to cauterize area of the septum from which it came. No evidence of residual mass or bleeding attention then turned to the inferior turbinates. In the right hand side the  suction cautery was used, arising inferior aspect of the inferior turbinate in similar fashion of the left infra turbinate was also cauterized. This provided excellent airway bilaterally. Stammberger gel was then applied within each nostril. The patient was then returned to anesthesia where she was explained. Takes care of her stable condition. Cultures none specimens right nasal mass/polyp estimated blood loss less than 20 mL  Dispo:   Good  Plan:  She will be discharged home for follow-up in 1 week  [name] T  06/25/2015 9:18 AM

## 2015-06-27 LAB — SURGICAL PATHOLOGY

## 2015-06-28 ENCOUNTER — Other Ambulatory Visit: Payer: Self-pay | Admitting: Unknown Physician Specialty

## 2015-06-28 DIAGNOSIS — C300 Malignant neoplasm of nasal cavity: Secondary | ICD-10-CM

## 2015-06-28 DIAGNOSIS — C801 Malignant (primary) neoplasm, unspecified: Secondary | ICD-10-CM

## 2015-07-03 ENCOUNTER — Inpatient Hospital Stay: Payer: Medicare PPO | Admitting: Oncology

## 2015-07-03 ENCOUNTER — Encounter: Payer: Self-pay | Admitting: Oncology

## 2015-07-03 ENCOUNTER — Inpatient Hospital Stay: Payer: Medicare PPO | Attending: Oncology | Admitting: Oncology

## 2015-07-03 VITALS — BP 109/74 | HR 74 | Temp 95.3°F | Wt 163.1 lb

## 2015-07-03 DIAGNOSIS — Z803 Family history of malignant neoplasm of breast: Secondary | ICD-10-CM | POA: Diagnosis not present

## 2015-07-03 DIAGNOSIS — R51 Headache: Secondary | ICD-10-CM | POA: Diagnosis not present

## 2015-07-03 DIAGNOSIS — K589 Irritable bowel syndrome without diarrhea: Secondary | ICD-10-CM | POA: Insufficient documentation

## 2015-07-03 DIAGNOSIS — M129 Arthropathy, unspecified: Secondary | ICD-10-CM | POA: Diagnosis not present

## 2015-07-03 DIAGNOSIS — I10 Essential (primary) hypertension: Secondary | ICD-10-CM | POA: Insufficient documentation

## 2015-07-03 DIAGNOSIS — F17210 Nicotine dependence, cigarettes, uncomplicated: Secondary | ICD-10-CM | POA: Diagnosis not present

## 2015-07-03 DIAGNOSIS — K219 Gastro-esophageal reflux disease without esophagitis: Secondary | ICD-10-CM | POA: Diagnosis not present

## 2015-07-03 DIAGNOSIS — J45909 Unspecified asthma, uncomplicated: Secondary | ICD-10-CM | POA: Diagnosis not present

## 2015-07-03 DIAGNOSIS — Z79899 Other long term (current) drug therapy: Secondary | ICD-10-CM | POA: Diagnosis not present

## 2015-07-03 DIAGNOSIS — F329 Major depressive disorder, single episode, unspecified: Secondary | ICD-10-CM | POA: Diagnosis not present

## 2015-07-03 DIAGNOSIS — E785 Hyperlipidemia, unspecified: Secondary | ICD-10-CM | POA: Diagnosis not present

## 2015-07-03 DIAGNOSIS — M479 Spondylosis, unspecified: Secondary | ICD-10-CM | POA: Diagnosis not present

## 2015-07-03 DIAGNOSIS — Z87442 Personal history of urinary calculi: Secondary | ICD-10-CM | POA: Insufficient documentation

## 2015-07-03 DIAGNOSIS — K5790 Diverticulosis of intestine, part unspecified, without perforation or abscess without bleeding: Secondary | ICD-10-CM | POA: Insufficient documentation

## 2015-07-03 DIAGNOSIS — C113 Malignant neoplasm of anterior wall of nasopharynx: Secondary | ICD-10-CM | POA: Insufficient documentation

## 2015-07-03 DIAGNOSIS — Z85820 Personal history of malignant melanoma of skin: Secondary | ICD-10-CM | POA: Diagnosis not present

## 2015-07-03 DIAGNOSIS — C300 Malignant neoplasm of nasal cavity: Secondary | ICD-10-CM

## 2015-07-03 DIAGNOSIS — C319 Malignant neoplasm of accessory sinus, unspecified: Secondary | ICD-10-CM

## 2015-07-03 DIAGNOSIS — Z800 Family history of malignant neoplasm of digestive organs: Secondary | ICD-10-CM | POA: Diagnosis not present

## 2015-07-03 DIAGNOSIS — Z7982 Long term (current) use of aspirin: Secondary | ICD-10-CM | POA: Insufficient documentation

## 2015-07-03 DIAGNOSIS — R911 Solitary pulmonary nodule: Secondary | ICD-10-CM | POA: Insufficient documentation

## 2015-07-03 DIAGNOSIS — Z86010 Personal history of colonic polyps: Secondary | ICD-10-CM | POA: Insufficient documentation

## 2015-07-03 NOTE — Progress Notes (Signed)
Patient does not have living will.  Current every day smoker.  Patient here as referral from Dr. Tami Ribas regarding polyp in right nare. Path report showed adenocarcinoma.

## 2015-07-04 ENCOUNTER — Ambulatory Visit
Admission: RE | Admit: 2015-07-04 | Discharge: 2015-07-04 | Disposition: A | Discharge status: Home / Self Care | Payer: Medicare PPO | Source: Ambulatory Visit | Attending: Unknown Physician Specialty | Admitting: Unknown Physician Specialty

## 2015-07-04 DIAGNOSIS — J301 Allergic rhinitis due to pollen: Secondary | ICD-10-CM | POA: Diagnosis not present

## 2015-07-05 ENCOUNTER — Ambulatory Visit
Admission: RE | Admit: 2015-07-05 | Discharge: 2015-07-05 | Disposition: A | Discharge status: Home / Self Care | Payer: Medicare PPO | Source: Ambulatory Visit | Attending: Unknown Physician Specialty | Admitting: Unknown Physician Specialty

## 2015-07-05 DIAGNOSIS — R0981 Nasal congestion: Secondary | ICD-10-CM | POA: Diagnosis not present

## 2015-07-05 DIAGNOSIS — R911 Solitary pulmonary nodule: Secondary | ICD-10-CM | POA: Diagnosis not present

## 2015-07-05 DIAGNOSIS — C801 Malignant (primary) neoplasm, unspecified: Secondary | ICD-10-CM

## 2015-07-05 DIAGNOSIS — C300 Malignant neoplasm of nasal cavity: Secondary | ICD-10-CM | POA: Insufficient documentation

## 2015-07-05 LAB — GLUCOSE, CAPILLARY: Glucose-Capillary: 81 mg/dL (ref 65–99)

## 2015-07-05 MED ORDER — FLUDEOXYGLUCOSE F - 18 (FDG) INJECTION
12.5500 | Freq: Once | INTRAVENOUS | Status: DC | PRN
  Administered 2015-07-05: 12.55 via INTRAVENOUS
  Filled 2015-07-05: qty 12.55

## 2015-07-07 ENCOUNTER — Encounter: Payer: Self-pay | Admitting: Oncology

## 2015-07-07 DIAGNOSIS — C319 Malignant neoplasm of accessory sinus, unspecified: Secondary | ICD-10-CM

## 2015-07-07 DIAGNOSIS — Z8522 Personal history of malignant neoplasm of nasal cavities, middle ear, and accessory sinuses: Secondary | ICD-10-CM | POA: Insufficient documentation

## 2015-07-07 DIAGNOSIS — C300 Malignant neoplasm of nasal cavity: Secondary | ICD-10-CM

## 2015-07-07 HISTORY — DX: Malignant neoplasm of nasal cavity: C30.0

## 2015-07-07 NOTE — Progress Notes (Signed)
[place] @ [facility] [phone]) [phone]  Fax:(336) [place] OB: 07-01-1956  MR#: [id number]  TGP#:[id number]  Patient Care Team: Jackolyn Confer, MD as PCP - General (Internal Medicine) Earnie Larsson, MD (Neurosurgery) Leeroy Cha, MD (Neurosurgery) Manya Silvas, MD (Gastroenterology) Jonette Mate, MD (General Surgery)  CHIEF COMPLAINT:  Chief Complaint  Patient presents with  . New Evaluation   1.  Polypoid mass in the right nasal cavity status post resection consistent with adenocarcinoma with lymphovascular invasion T1 N1 M0 tumor (August of 2016) VISIT DIAGNOSIS:     ICD-9-CM ICD-10-CM   1. Cancer of nasal cavity and sinus 160.9 C31.9     Cancer of the right nasal cavity  No history exists.    Oncology Flowsheet 05/13/2011 08/04/2013 06/25/2015  dexamethasone (DECADRON) IJ - - -  dexamethasone (DECADRON) IV - 10 mg -  diazepam (VALIUM) PO 10 mg - -  LORazepam (ATIVAN) IV - 1 mg -  metoCLOPramide (REGLAN) IV - 10 mg -  ondansetron (ZOFRAN) IM 4 mg - -  ondansetron (ZOFRAN) IV - 4 mg -    INTERVAL HISTORY: \ 59 year old lady who is a chronic smoker started complaining stuffy nose particularly on the right side.  Core.  Of several months.  Patient underwent initial evaluation but as complained continued patient underwent further scope examination.  Which revealed a polypoid mass of the right nasal cavity appears to be coming from the nasal septum.  Patient underwent resection which was adenocarcinoma.  A PET scan is pending.  Patient is here for further evaluation and treatment consideration  REVIEW OF SYSTEMS:   GENERAL:  Feels good.  Active.  No fevers, sweats or weight loss. PERFORMANCE STATUS (ECOG):  0 HEENT:  No visual changes, runny nose, sore throat, mouth sores or tenderness. Lungs: No shortness of breath or cough.  No hemoptysis. Cardiac:  No chest pain, palpitations, orthopnea, or PND. GI:  No nausea,  vomiting, diarrhea, constipation, melena or hematochezia. GU:  No urgency, frequency, dysuria, or hematuria.  Recent had a hysterectomy Musculoskeletal:  No back pain.  No joint pain.  No muscle tenderness.  Chronic back pain on Flexeril Extremities:  No pain or swelling. Skin:  No rashes or skin changes. Neuro:  No headache, numbness or weakness, balance or coordination issues.  Migraine headache Endocrine:  No diabetes, thyroid issues, hot flashes or night sweats. Psych:  No mood changes, depression or anxiety. Pain:  No focal pain. Review of systems:  All other systems reviewed and found to be negative.  As per HPI. Otherwise, a complete review of systems is negatve.  PAST MEDICAL HISTORY: Past Medical History  Diagnosis Date  . Asthma   . Diverticulosis   . Headaches, cluster   . Colon polyps   . IBS (irritable bowel syndrome)   . Allergic rhinitis   . Arthritis   . Cervical spondylosis   . Glaucoma   . Kidney stones   . Hyperlipidemia   . Hypertension   . Tobacco abuse   . Depression   . GERD (gastroesophageal reflux disease)   . Cancer     melanoma  . Neuropathy   . PONV (postoperative nausea and vomiting)   . Colitis   . Cancer of nasal cavity and sinus 07/07/2015    PAST SURGICAL HISTORY: Past Surgical History  Procedure Laterality Date  . Appendectomy  1998  . Abdominal hysterectomy  1998  . Lumbar spine surgery  07/2004  .  Vaginal delivery      x2  . Arm fx repair  2001  . Cervical fusion  2010  . Cholecystectomy  08/2011    Dr Rochel Brome  . Esophagogastroduodenoscopy  2012    Dr Tiffany Kocher  . Colonoscopy      Dr Tiffany Kocher  . Tonsillectomy  1968  . Cystoscopy      Dr. Bernardo Heater  . Back surgery    . Dilation and curettage of uterus    . Fracture surgery  2001    plate in right wrist and arm  . Image guided sinus surgery N/A 06/25/2015    Procedure: IMAGE GUIDED SINUS SURGERY;  Surgeon: Beverly Gust, MD;  Location: [facility] ORS;  Service: ENT;  Laterality:  N/A;  . Polypectomy N/A 06/25/2015    Procedure: POLYPECTOMY NASAL;  Surgeon: Beverly Gust, MD;  Location: [facility] ORS;  Service: ENT;  Laterality: N/A;    FAMILY HISTORY Family History  Problem Relation Age of Onset  . Hypertension Mother   . Hyperlipidemia Mother   . Diabetes Mother   . Colon cancer Father   . Pancreatic cancer Father   . Cancer Father     Colon & Pancreatic  . Breast cancer Paternal Grandmother   . Diabetes Paternal Grandmother   . Coronary artery disease Paternal Grandmother   . Heart failure Paternal Grandmother   . Cancer Paternal Grandmother     breast  . Stroke Brother     GYNECOLOGIC HISTORY:  No LMP recorded. Patient has had a hysterectomy.     ADVANCED DIRECTIVES:  Patient does not have any living will or healthcare power of attorney.  Information was given .  Available resources had been discussed.  We will follow-up on subsequent appointments regarding this issue  HEALTH MAINTENANCE: Social History  Substance Use Topics  . Smoking status: Current Every Day Smoker -- 0.50 packs/day for 39 years    Types: Cigarettes  . Smokeless tobacco: Never Used     Comment: e cigs currently--trying to quit  . Alcohol Use: No      Allergies  Allergen Reactions  . Albumin (Human) Rash  . Amoxicillin-Pot Clavulanate Hives  . Gabapentin Shortness Of Breath  . Latex Dermatitis and Rash  . Betadine [Povidone Iodine] Dermatitis, Rash and Other (See Comments)    Skin burning Topical betadine and iodine have this reaction with patient.  IV contrast is NOT a problem.  jkl  . Sucralfate     Other reaction(s): Other (See Comments) Abdominal Pain  . Amoxicillin     REACTION: rash  . Chantix [Varenicline Tartrate]     Heart racing  . Erythromycin     REACTION: rash  . Sucralfate Nausea And Vomiting    Heart racing Lightheaded     Current Outpatient Prescriptions  Medication Sig Dispense Refill  . albuterol (PROVENTIL HFA;VENTOLIN HFA) 108 (90 BASE)  MCG/ACT inhaler Inhale 2 puffs into the lungs every 6 (six) hours as needed. 18 g 2  . aspirin EC 81 MG tablet Take 81 mg by mouth daily.    Marland Kitchen aspirin-acetaminophen-caffeine (EXCEDRIN MIGRAINE) 250-250-65 MG per tablet Take 2 tablets by mouth as needed for headache.    Marland Kitchen atenolol (TENORMIN) 25 MG tablet Take 1 tablet (25 mg total) by mouth daily. 90 tablet 3  . cetirizine (ZYRTEC) 10 MG tablet Take 10 mg by mouth daily. Allergies.    . cyclobenzaprine (FLEXERIL) 10 MG tablet Take 1 tablet (10 mg total) by mouth 2 (two) times daily. (  Patient taking differently: Take 10 mg by mouth 3 (three) times daily. ) 30 tablet 6  . HYDROcodone-acetaminophen (NORCO/VICODIN) 5-325 MG per tablet Take 1 tablet by mouth every 6 (six) hours as needed for moderate pain. 30 tablet 0  . loperamide (IMODIUM A-D) 2 MG tablet Take 2 mg by mouth 4 (four) times daily as needed for diarrhea or loose stools.    . pantoprazole (PROTONIX) 40 MG tablet Take 1 tablet (40 mg total) by mouth 2 (two) times daily. 60 tablet 11   No current facility-administered medications for this visit.   Facility-Administered Medications Ordered in Other Visits  Medication Dose Route Frequency Provider Last Rate Last Dose  . fludeoxyglucose F - 18 (FDG) injection 12.55 milli Curie  12.55 milli Curie Intravenous Once PRN Medication Radiologist, MD   12.55 milli Curie at 07/05/15 0845    OBJECTIVE: PHYSICAL EXAM: GENERAL:  Well developed, well nourished, sitting comfortably in the exam room in no acute distress. MENTAL STATUS:  Alert and oriented to person, place and time.  EYES:   Pupils equal round and reactive to light and accomodation.  No conjunctivitis or scleral icterus. ENT:  Oropharynx clear without lesion.  Tongue normal. Mucous membranes moist.  RESPIRATORY:  Clear to auscultation without rales, wheezes or rhonchi. CARDIOVASCULAR:  Regular rate and rhythm without murmur, rub or gallop.  ABDOMEN:  Soft, non-tender, with active  bowel sounds, and no hepatosplenomegaly.  No masses. BACK:  No CVA tenderness.  No tenderness on percussion of the back or rib cage. SKIN:  No rashes, ulcers or lesions. EXTREMITIES: No edema, no skin discoloration or tenderness.  No palpable cords. LYMPH NODES: No palpable cervical, supraclavicular, axillary or inguinal adenopathy  NEUROLOGICAL: Unremarkable. PSYCH:  Appropriate.  Filed Vitals:   07/03/15 1521  BP: 109/74  Pulse: 74  Temp: 95.3 F (35.2 C)     Body mass index is 26.34 kg/(m^2).    ECOG FS:0 - Asymptomatic  LAB RESULTS:  No visits with results within 5 Day(s) from this visit. Latest known visit with results is:  Admission on 06/25/2015, Discharged on 06/25/2015  Component Date Value Ref Range Status  . SURGICAL PATHOLOGY 06/25/2015    Final                   Value:Surgical Pathology CASE: [id number] PATIENT: Lona Millard Surgical Pathology Report     SPECIMEN SUBMITTED: A. Nasal polyp, right B. Nasal polyp, right #2  CLINICAL HISTORY:   PRE-OPERATIVE DIAGNOSIS: Epistaxis, nasal polyps  POST-OPERATIVE DIAGNOSIS: Large right intranasal polypoid mass emanating from the superior aspect of the nasal septum on the right     DIAGNOSIS: A. AND B. NASAL POLYP, RIGHT; EXCISION: - ADENOCARCINOMA. - ANGIOLYMPHATIC INVASION IS PRESENT.  Comment: Sections demonstrate malignant respiratory type epithelium arranged in glands and tubules. While a subset of the tumor has a low grade appearance areas of significant pleomorphism and increased mitoses are noted.  A limited panel of immunohistochemical stains was performed. The carcinoma demonstrates the following immunoreactivity: Cytokeratin 7: Positive Cytokeratin 20: Negative TTF-1: Negative CDX-2: Negative Stain controls worked appropriately. Based on the morphologic a                         ppearance of this carcinoma with the above pattern of immunoreactivity I favor a primary sinonasal  adenocarcinoma, non-intestinal type with high grade features.    NASAL CAVITY AND PARANASAL SINUSES: Incisional Biopsy, Excisional Biopsy, Resection Nasal  Cavity and  Paranasal Sinuses, Excisional Biopsy, Resection Cancer Case Summary SPECIMEN Specimen: Nasal cavity Septum Received: Fresh Procedure:     Excisional biopsy Specimen Size Greatest Dimensions Specify (cm):  Specify (cm) 4cm Specify (cm) 2.3cm Specify (cm) 0.3cm Specimen Laterality:     Right Primary Tumor Site: Nasal cavity Septum Additional Sites Involved by Tumor:     None identified Tumor Focality:     Single focus TUMOR Histologic Type (Note B):     Non-intestinal type High grade Histologic Grade:   Not applicable EXTENT Tumor Size:    Greatest dimension (cm) 4cm MARGINS Cannot be assessed ACCESSORY FINDINGS Lymph-Vascular Invasion: Present Perineural Invasion:     Not identified not ide                         ntified LYMPH NODES Lymph Nodes, Extranodal Extension: No nodes submitted or found STAGE (pTNM) Pathologic Staging: For All Carcinomas Excluding Mucosal Melanoma Primary Tumor (pT): Primary Tumor (pT): Nasal Cavity and Ethmoid Sinus pT1: Tumor restricted to any one subsite, with or without bone invasion Regional Lymph Nodes  (pN) #: pNX: Cannot be assessed No nodes submitted or found Distant Metastasis (pM): Not applicable   GROSS DESCRIPTION:  A. Intraoperative Consultation:     Received:  fresh     Specimen: right nasal polyp     Pathologic Evaluation: frozen section     Diagnosis: FS A1-adenocarcinoma     Communicated to: Dr. Tami Ribas at 9:21 AM on 06/25/2015     Tissue submitted: 1  Labeled: right nasal polyp Tissue Fragment(s): 2 Measurement: 2.1 x 1.2 x 1.1 cm and 1.5 x 0.9 x 0.3 cm Comment: unoriented purple to tan fragments, representative central section of largest fragment frozen, following frozen section is formalin fixed   Entirely submitted in                           cassette(s): 1-frozen section remnant 2-3-remaining largest fragment 4- smaller fragment   B. Labeled: right nasal polyp #2 Tissue Fragment(s): multiple Measurement: aggregate, 4.0 x 2.3 x 0.3 cm Comment: pink-tan soft fragments  Entirely submitted in cassette(s): 1-2  Final Diagnosis performed by Quay Burow, MD.  Electronically signed 06/27/2015 3:58:26PM    The electronic signature indicates that the named Attending Pathologist has evaluated the specimen  Technical component performed at [facility], [address], [place], [place] [address] Lab: [phone] Dir: Darrick Penna. Evette Doffing, MD  Professional component performed at [facility], [facility], [place], [place], [place] [address] Lab: [phone] Dir: Dellia Nims. Reuel Derby, MD       STUDIES: Mr Jeri Cos Wo Contrast  Jun 25, 2015   CLINICAL DATA:  Polyp noted in doctor's office. Evaluate for encephalocele.  EXAM: MRI HEAD WITHOUT AND WITH CONTRAST  TECHNIQUE: Multiplanar, multiecho pulse sequences of the brain and surrounding structures were obtained without and with intravenous contrast.  CONTRAST:  46mL MULTIHANCE GADOBENATE DIMEGLUMINE 529 MG/ML IV SOLN  COMPARISON:  08/09/2013  FINDINGS: Calvarium and upper cervical spine: No focal marrow signal abnormality.  Orbits: No significant findings.  Sinuses and Mastoids: Homogeneously enhancing 28 x 14 x 23 mm mass in the right nasal cavity obstructing the right nasal cavity and distorting the posterior middle and inferior turbinates. Presence of bony destruction would be better assessed by CT, though for size the mass has overall benign features and large polyp is favored. There is no antral component. Question if polyp was developing in  2014 when only mild mucosal thickening was seen in the upper right nasal cavity. Concerning the possibility of cephalocele, there is no evidence of floor discontinuity or distortion of the inferior frontal lobes. On a 2014 head CT,  there was no gross bony defect or remote fracture. If strong clinical concern for cephalocele, thin section facial CT could evaluate the floor of the anterior cranial fossa.  Brain: No acute or remote infarct, hemorrhage, hydrocephalus, or mass lesion. No evidence of large vessel occlusion.  There is moderate white matter disease for age, with the periventricular and deep patchy T2 and FLAIR hyperintense signal abnormalities. Pattern and distribution is stable from 2014.  IMPRESSION: 1. Polypoid 3 cm obstructing right nasal cavity mass. No indication of cephalocele. Further description above. 2. Moderate white matter disease which could be from chronic small vessel ischemia or demyelination, stable from 2014.   Electronically Signed   By: Monte Fantasia M.D.   On: 06/18/2015 09:30   Nm Pet Image Initial (pi) Skull Base To Thigh  07/05/2015   CLINICAL DATA:  Initial treatment strategy for adenocarcinoma of a nasal polyp.  EXAM: NUCLEAR MEDICINE PET SKULL BASE TO THIGH  TECHNIQUE: The re- clinical to the mCi F-18 FDG was injected intravenously. Full-ring PET imaging was performed from the skull base to thigh after the radiotracer. CT data was obtained and used for attenuation correction and anatomic localization.  FASTING BLOOD GLUCOSE:  Value: 81 mg/dl  COMPARISON:  Brain MRI 06/18/2015,  FINDINGS: NECK  No hypermetabolic lymph nodes in the neck. The masses within the RIGHT ethmoid sinus is hypermetabolic with SUV max 6.2.  CHEST  No hypermetabolic mediastinal lymph nodes. No hypermetabolic pulmonary nodules.  1 mm pulmonary nodule in the LEFT upper lobe on image 75, series 3. Calcified nodule in the RIGHT lower lobe on image 86.  ABDOMEN/PELVIS  No abnormal hypermetabolic activity within the liver, pancreas, adrenal glands, or spleen. No hypermetabolic lymph nodes in the abdomen or pelvis.  SKELETON  No focal hypermetabolic activity to suggest skeletal metastasis.  IMPRESSION: 1. Hypermetabolic lesion in the  RIGHT ethmoid sinus consistent with biopsy-proven adenocarcinoma. 2. No evidence of local nodal metastasis in the neck. 3. No evidence distant metastasis. 4. Small 1 mm LEFT upper lobe pulmonary nodule is likely benign.   Electronically Signed   By: Suzy Bouchard M.D.   On: 07/05/2015 11:06    ASSESSMENT:  1.  Right sino  Nasal carcinoma.  Adenocarcinoma with lymphovascular invasion\ PET scan has been reviewed independently sews hypermetabolic region of the right ethmoid sinus consistent with biopsy-proven adenocarcinoma.  No evidence of local nodal metastasis disease. 2.  Small pulmonary nodule needs to be followed   T1NOMO Stage I cancer PLAN:   Case will be discussed in tumor conference Possibility of radiation therapy can be considered  Patient expressed understanding and was in agreement with this plan. She also understands that She can call clinic at any time with any questions, concerns, or complaints.    No matching staging information was found for the patient.  Forest Gleason, MD   07/07/2015 4:14 PM

## 2015-07-08 DIAGNOSIS — J301 Allergic rhinitis due to pollen: Secondary | ICD-10-CM | POA: Diagnosis not present

## 2015-07-09 ENCOUNTER — Inpatient Hospital Stay (HOSPITAL_BASED_OUTPATIENT_CLINIC_OR_DEPARTMENT_OTHER): Payer: Medicare PPO | Admitting: Oncology

## 2015-07-09 ENCOUNTER — Encounter: Payer: Self-pay | Admitting: Oncology

## 2015-07-09 VITALS — BP 122/86 | HR 84 | Temp 95.9°F | Wt 163.0 lb

## 2015-07-09 DIAGNOSIS — Z87442 Personal history of urinary calculi: Secondary | ICD-10-CM

## 2015-07-09 DIAGNOSIS — F17210 Nicotine dependence, cigarettes, uncomplicated: Secondary | ICD-10-CM

## 2015-07-09 DIAGNOSIS — R51 Headache: Secondary | ICD-10-CM | POA: Diagnosis not present

## 2015-07-09 DIAGNOSIS — E785 Hyperlipidemia, unspecified: Secondary | ICD-10-CM

## 2015-07-09 DIAGNOSIS — R911 Solitary pulmonary nodule: Secondary | ICD-10-CM

## 2015-07-09 DIAGNOSIS — Z79899 Other long term (current) drug therapy: Secondary | ICD-10-CM

## 2015-07-09 DIAGNOSIS — K589 Irritable bowel syndrome without diarrhea: Secondary | ICD-10-CM

## 2015-07-09 DIAGNOSIS — I10 Essential (primary) hypertension: Secondary | ICD-10-CM

## 2015-07-09 DIAGNOSIS — Z7982 Long term (current) use of aspirin: Secondary | ICD-10-CM | POA: Diagnosis not present

## 2015-07-09 DIAGNOSIS — K219 Gastro-esophageal reflux disease without esophagitis: Secondary | ICD-10-CM

## 2015-07-09 DIAGNOSIS — J45909 Unspecified asthma, uncomplicated: Secondary | ICD-10-CM | POA: Diagnosis not present

## 2015-07-09 DIAGNOSIS — C300 Malignant neoplasm of nasal cavity: Secondary | ICD-10-CM

## 2015-07-09 DIAGNOSIS — C113 Malignant neoplasm of anterior wall of nasopharynx: Secondary | ICD-10-CM

## 2015-07-09 DIAGNOSIS — K5790 Diverticulosis of intestine, part unspecified, without perforation or abscess without bleeding: Secondary | ICD-10-CM | POA: Diagnosis not present

## 2015-07-09 DIAGNOSIS — F329 Major depressive disorder, single episode, unspecified: Secondary | ICD-10-CM

## 2015-07-09 DIAGNOSIS — M129 Arthropathy, unspecified: Secondary | ICD-10-CM

## 2015-07-09 DIAGNOSIS — Z86010 Personal history of colonic polyps: Secondary | ICD-10-CM

## 2015-07-09 DIAGNOSIS — M479 Spondylosis, unspecified: Secondary | ICD-10-CM

## 2015-07-09 DIAGNOSIS — Z85820 Personal history of malignant melanoma of skin: Secondary | ICD-10-CM

## 2015-07-09 DIAGNOSIS — C319 Malignant neoplasm of accessory sinus, unspecified: Principal | ICD-10-CM

## 2015-07-09 NOTE — Progress Notes (Signed)
Heather Beasley @ [facility] [phone]) [phone]  Fax:(336) [place]: 27-Oct-1955  MR#: [id number]  CZY#:[id number]  Patient Care Team: Jackolyn Confer, MD as PCP - General (Internal Medicine) Earnie Larsson, MD (Neurosurgery) Leeroy Cha, MD (Neurosurgery) Manya Silvas, MD (Gastroenterology) Jonette Mate, MD (General Surgery)  CHIEF COMPLAINT:  Chief Complaint  Patient presents with  . Follow-up    Discuss PET SCAN Results   1.  Polypoid mass in the right nasal cavity status post resection consistent with adenocarcinoma with lymphovascular invasion T1 N1 M0 tumor (August of 2016) 2.  PET scan is positive with possibility of tumor in the right ethmoid sinus 3.  Small lung nodule in the right lower lobe and left upper lobe appears to be benign needs follow-up VISIT DIAGNOSIS:  , No diagnosis found.  Cancer of the right nasal cavity  No history exists.    Oncology Flowsheet 05/13/2011 08/04/2013 06/25/2015  dexamethasone (DECADRON) IJ - - -  dexamethasone (DECADRON) IV - 10 mg -  diazepam (VALIUM) PO 10 mg - -  LORazepam (ATIVAN) IV - 1 mg -  metoCLOPramide (REGLAN) IV - 10 mg -  ondansetron (ZOFRAN) IM 4 mg - -  ondansetron (ZOFRAN) IV - 4 mg -    INTERVAL HISTORY: \ 59 year old lady who is a chronic smoker started complaining stuffy nose particularly on the right side.  Core.  Of several months.  Patient underwent initial evaluation but as complained continued patient underwent further scope examination.  Which revealed a polypoid mass of the right nasal cavity appears to be coming from the nasal septum.  Patient underwent resection which was adenocarcinoma.  A PET scan is pending.  Patient is here for further evaluation and treatment consideration Patient is here for further follow-up after PET scan.   REVIEW OF SYSTEMS:   GENERAL:  Feels good.  Active.  No fevers, sweats or weight loss. PERFORMANCE STATUS (ECOG):  0 HEENT:   No visual changes, runny nose, sore throat, mouth sores or tenderness. Lungs: No shortness of breath or cough.  No hemoptysis. Cardiac:  No chest pain, palpitations, orthopnea, or PND. GI:  No nausea, vomiting, diarrhea, constipation, melena or hematochezia. GU:  No urgency, frequency, dysuria, or hematuria.  Recent had a hysterectomy Musculoskeletal:  No back pain.  No joint pain.  No muscle tenderness.  Chronic back pain on Flexeril Extremities:  No pain or swelling. Skin:  No rashes or skin changes. Neuro:  No headache, numbness or weakness, balance or coordination issues.  Migraine headache Endocrine:  No diabetes, thyroid issues, hot flashes or night sweats. Psych:  No mood changes, depression or anxiety. Pain:  No focal pain. Review of systems:  All other systems reviewed and found to be negative.  As per HPI. Otherwise, a complete review of systems is negatve.  PAST MEDICAL HISTORY: Past Medical History  Diagnosis Date  . Asthma   . Diverticulosis   . Headaches, cluster   . Colon polyps   . IBS (irritable bowel syndrome)   . Allergic rhinitis   . Arthritis   . Cervical spondylosis   . Glaucoma   . Kidney stones   . Hyperlipidemia   . Hypertension   . Tobacco abuse   . Depression   . GERD (gastroesophageal reflux disease)   . Cancer     melanoma  . Neuropathy   . PONV (postoperative nausea and vomiting)   . Colitis   . Cancer of nasal  cavity and sinus 07/07/2015    PAST SURGICAL HISTORY: Past Surgical History  Procedure Laterality Date  . Appendectomy  1998  . Abdominal hysterectomy  1998  . Lumbar spine surgery  07/2004  . Vaginal delivery      x2  . Arm fx repair  2001  . Cervical fusion  2010  . Cholecystectomy  08/2011    Dr Rochel Brome  . Esophagogastroduodenoscopy  2012    Dr Tiffany Kocher  . Colonoscopy      Dr Tiffany Kocher  . Tonsillectomy  1968  . Cystoscopy      Dr. Bernardo Heater  . Back surgery    . Dilation and curettage of uterus    . Fracture surgery   2001    plate in right wrist and arm  . Image guided sinus surgery N/A 06/25/2015    Procedure: IMAGE GUIDED SINUS SURGERY;  Surgeon: Beverly Gust, MD;  Location: [facility] ORS;  Service: ENT;  Laterality: N/A;  . Polypectomy N/A 06/25/2015    Procedure: POLYPECTOMY NASAL;  Surgeon: Beverly Gust, MD;  Location: [facility] ORS;  Service: ENT;  Laterality: N/A;    FAMILY HISTORY Family History  Problem Relation Age of Onset  . Hypertension Mother   . Hyperlipidemia Mother   . Diabetes Mother   . Colon cancer Father   . Pancreatic cancer Father   . Cancer Father     Colon & Pancreatic  . Breast cancer Paternal Grandmother   . Diabetes Paternal Grandmother   . Coronary artery disease Paternal Grandmother   . Heart failure Paternal Grandmother   . Cancer Paternal Grandmother     breast  . Stroke Brother     GYNECOLOGIC HISTORY:  No LMP recorded. Patient has had a hysterectomy.     ADVANCED DIRECTIVES:  Patient does not have any living will or healthcare power of attorney.  Information was given .  Available resources had been discussed.  We will follow-up on subsequent appointments regarding this issue  HEALTH MAINTENANCE: Social History  Substance Use Topics  . Smoking status: Current Every Day Smoker -- 0.50 packs/day for 39 years    Types: Cigarettes  . Smokeless tobacco: Never Used     Comment: e cigs currently--trying to quit  . Alcohol Use: No      Allergies  Allergen Reactions  . Albumin (Human) Rash  . Amoxicillin-Pot Clavulanate Hives  . Gabapentin Shortness Of Breath  . Latex Dermatitis and Rash  . Betadine [Povidone Iodine] Dermatitis, Rash and Other (See Comments)    Skin burning Topical betadine and iodine have this reaction with patient.  IV contrast is NOT a problem.  jkl  . Sucralfate     Other reaction(s): Other (See Comments) Abdominal Pain  . Amoxicillin     REACTION: rash  . Chantix [Varenicline Tartrate]     Heart racing  . Erythromycin      REACTION: rash  . Sucralfate Nausea And Vomiting    Heart racing Lightheaded     Current Outpatient Prescriptions  Medication Sig Dispense Refill  . albuterol (PROVENTIL HFA;VENTOLIN HFA) 108 (90 BASE) MCG/ACT inhaler Inhale 2 puffs into the lungs every 6 (six) hours as needed. 18 g 2  . aspirin EC 81 MG tablet Take 81 mg by mouth daily.    Marland Kitchen aspirin-acetaminophen-caffeine (EXCEDRIN MIGRAINE) 250-250-65 MG per tablet Take 2 tablets by mouth as needed for headache.    Marland Kitchen atenolol (TENORMIN) 25 MG tablet Take 1 tablet (25 mg total) by mouth daily. [place]  tablet 3  . cetirizine (ZYRTEC) 10 MG tablet Take 10 mg by mouth daily. Allergies.    . cyclobenzaprine (FLEXERIL) 10 MG tablet Take 1 tablet (10 mg total) by mouth 2 (two) times daily. (Patient taking differently: Take 10 mg by mouth 3 (three) times daily. ) 30 tablet 6  . HYDROcodone-acetaminophen (NORCO/VICODIN) 5-325 MG per tablet Take 1 tablet by mouth every 6 (six) hours as needed for moderate pain. 30 tablet 0  . loperamide (IMODIUM A-D) 2 MG tablet Take 2 mg by mouth 4 (four) times daily as needed for diarrhea or loose stools.    . pantoprazole (PROTONIX) 40 MG tablet Take 1 tablet (40 mg total) by mouth 2 (two) times daily. 60 tablet 11   No current facility-administered medications for this visit.   Facility-Administered Medications Ordered in Other Visits  Medication Dose Route Frequency Provider Last Rate Last Dose  . fludeoxyglucose F - 18 (FDG) injection 12.55 milli Curie  12.55 milli Curie Intravenous Once PRN Medication Radiologist, MD   12.55 milli Curie at 07/05/15 0845    OBJECTIVE: PHYSICAL EXAM: GENERAL:  Well developed, well nourished, sitting comfortably in the exam room in no acute distress. MENTAL STATUS:  Alert and oriented to person, place and time.  EYES:   Pupils equal round and reactive to light and accomodation.  No conjunctivitis or scleral icterus. ENT:  Oropharynx clear without lesion.  Tongue normal. Mucous  membranes moist.  RESPIRATORY:  Clear to auscultation without rales, wheezes or rhonchi. CARDIOVASCULAR:  Regular rate and rhythm without murmur, rub or gallop.  ABDOMEN:  Soft, non-tender, with active bowel sounds, and no hepatosplenomegaly.  No masses. BACK:  No CVA tenderness.  No tenderness on percussion of the back or rib cage. SKIN:  No rashes, ulcers or lesions. EXTREMITIES: No edema, no skin discoloration or tenderness.  No palpable cords. LYMPH NODES: No palpable cervical, supraclavicular, axillary or inguinal adenopathy  NEUROLOGICAL: Unremarkable. PSYCH:  Appropriate.  Filed Vitals:   07/09/15 0945  BP: 122/86  Pulse: 84  Temp: 95.9 F (35.5 C)     Body mass index is 26.32 kg/(m^2).    ECOG FS:0 - Asymptomatic  LAB RESULTS:  Hospital Outpatient Visit on 07/05/2015  Component Date Value Ref Range Status  . Glucose-Capillary 07/05/2015 81  65 - 99 mg/dL Final     STUDIES: Mr Kizzie Fantasia Contrast  2015-07-04   CLINICAL DATA:  Polyp noted in doctor's office. Evaluate for encephalocele.  EXAM: MRI HEAD WITHOUT AND WITH CONTRAST  TECHNIQUE: Multiplanar, multiecho pulse sequences of the brain and surrounding structures were obtained without and with intravenous contrast.  CONTRAST:  95mL MULTIHANCE GADOBENATE DIMEGLUMINE 529 MG/ML IV SOLN  COMPARISON:  08/09/2013  FINDINGS: Calvarium and upper cervical spine: No focal marrow signal abnormality.  Orbits: No significant findings.  Sinuses and Mastoids: Homogeneously enhancing 28 x 14 x 23 mm mass in the right nasal cavity obstructing the right nasal cavity and distorting the posterior middle and inferior turbinates. Presence of bony destruction would be better assessed by CT, though for size the mass has overall benign features and large polyp is favored. There is no antral component. Question if polyp was developing in 2014 when only mild mucosal thickening was seen in the upper right nasal cavity. Concerning the possibility of  cephalocele, there is no evidence of floor discontinuity or distortion of the inferior frontal lobes. On a 2014 head CT, there was no gross bony defect or remote fracture. If strong clinical concern  for cephalocele, thin section facial CT could evaluate the floor of the anterior cranial fossa.  Brain: No acute or remote infarct, hemorrhage, hydrocephalus, or mass lesion. No evidence of large vessel occlusion.  There is moderate Heather matter disease for age, with the periventricular and deep patchy T2 and FLAIR hyperintense signal abnormalities. Pattern and distribution is stable from 2014.  IMPRESSION: 1. Polypoid 3 cm obstructing right nasal cavity mass. No indication of cephalocele. Further description above. 2. Moderate Heather matter disease which could be from chronic small vessel ischemia or demyelination, stable from 2014.   Electronically Signed   By: Monte Fantasia M.D.   On: 06/18/2015 09:30   Nm Pet Image Initial (pi) Skull Base To Thigh  07/05/2015   CLINICAL DATA:  Initial treatment strategy for adenocarcinoma of a nasal polyp.  EXAM: NUCLEAR MEDICINE PET SKULL BASE TO THIGH  TECHNIQUE: The re- clinical to the mCi F-18 FDG was injected intravenously. Full-ring PET imaging was performed from the skull base to thigh after the radiotracer. CT data was obtained and used for attenuation correction and anatomic localization.  FASTING BLOOD GLUCOSE:  Value: 81 mg/dl  COMPARISON:  Brain MRI 06/18/2015,  FINDINGS: NECK  No hypermetabolic lymph nodes in the neck. The masses within the RIGHT ethmoid sinus is hypermetabolic with SUV max 6.2.  CHEST  No hypermetabolic mediastinal lymph nodes. No hypermetabolic pulmonary nodules.  1 mm pulmonary nodule in the LEFT upper lobe on image 75, series 3. Calcified nodule in the RIGHT lower lobe on image 86.  ABDOMEN/PELVIS  No abnormal hypermetabolic activity within the liver, pancreas, adrenal glands, or spleen. No hypermetabolic lymph nodes in the abdomen or pelvis.   SKELETON  No focal hypermetabolic activity to suggest skeletal metastasis.  IMPRESSION: 1. Hypermetabolic lesion in the RIGHT ethmoid sinus consistent with biopsy-proven adenocarcinoma. 2. No evidence of local nodal metastasis in the neck. 3. No evidence distant metastasis. 4. Small 1 mm LEFT upper lobe pulmonary nodule is likely benign.   Electronically Signed   By: Suzy Bouchard M.D.   On: 07/05/2015 11:06    ASSESSMENT:  1.  Right sino  Nasal carcinoma.  Adenocarcinoma with lymphovascular invasion\ PET scan has been reviewed independently sews hypermetabolic region of the right ethmoid sinus consistent with biopsy-proven adenocarcinoma.  No evidence of local nodal metastasis disease. 2.  Small pulmonary nodule needs to be followed   T1NOMO Stage I cancer PLAN:   Case will be discussed in tumor conference PET scan has been reviewed.  There is increased uptake in the right ethmoid sinus probably patient's adenocarcinoma started in the right ethmoid sinus.  We discussed this case in tumor conference possibility of further exploration of the sinus by ENT surgeon versus possibility of radiation and chemotherapy weekly by cis-platinum can be considered. All these options would be discussed Patient would be evaluated by ENT surgeon Dr. Tami Ribas as well as by Dr. Donella Stade after discussing case in tumor conference in reviewing pathology. 2.  Regarding smoking cessation  I had prolonged discussion. Patient is allergic to Chantix.  Patient was referred to lifestyle Center. information was given.. 3.  Lung nodule which needs to be followed up with another CT scan   Patient expressed understanding and was in agreement with this plan. She also understands that She can call clinic at any time with any questions, concerns, or complaints.    No matching staging information was found for the patient.  Forest Gleason, MD   07/09/2015 9:58 AM

## 2015-07-11 DIAGNOSIS — J301 Allergic rhinitis due to pollen: Secondary | ICD-10-CM | POA: Diagnosis not present

## 2015-07-15 ENCOUNTER — Encounter: Payer: Self-pay | Admitting: Radiation Oncology

## 2015-07-15 ENCOUNTER — Encounter: Payer: Self-pay | Admitting: Oncology

## 2015-07-15 ENCOUNTER — Inpatient Hospital Stay (HOSPITAL_BASED_OUTPATIENT_CLINIC_OR_DEPARTMENT_OTHER): Payer: Medicare PPO | Admitting: Oncology

## 2015-07-15 ENCOUNTER — Ambulatory Visit
Admission: RE | Admit: 2015-07-15 | Discharge: 2015-07-15 | Disposition: A | Discharge status: Home / Self Care | Payer: Medicare PPO | Source: Ambulatory Visit | Attending: Radiation Oncology | Admitting: Radiation Oncology

## 2015-07-15 VITALS — BP 132/80 | HR 75 | Temp 95.4°F | Resp 18 | Wt 166.1 lb

## 2015-07-15 DIAGNOSIS — R51 Headache: Secondary | ICD-10-CM

## 2015-07-15 DIAGNOSIS — C113 Malignant neoplasm of anterior wall of nasopharynx: Secondary | ICD-10-CM | POA: Diagnosis not present

## 2015-07-15 DIAGNOSIS — C300 Malignant neoplasm of nasal cavity: Secondary | ICD-10-CM

## 2015-07-15 DIAGNOSIS — C319 Malignant neoplasm of accessory sinus, unspecified: Principal | ICD-10-CM

## 2015-07-15 DIAGNOSIS — M129 Arthropathy, unspecified: Secondary | ICD-10-CM

## 2015-07-15 DIAGNOSIS — Z7982 Long term (current) use of aspirin: Secondary | ICD-10-CM

## 2015-07-15 DIAGNOSIS — J45909 Unspecified asthma, uncomplicated: Secondary | ICD-10-CM

## 2015-07-15 DIAGNOSIS — J301 Allergic rhinitis due to pollen: Secondary | ICD-10-CM | POA: Diagnosis not present

## 2015-07-15 DIAGNOSIS — Z510 Encounter for antineoplastic radiation therapy: Secondary | ICD-10-CM | POA: Insufficient documentation

## 2015-07-15 DIAGNOSIS — C311 Malignant neoplasm of ethmoidal sinus: Secondary | ICD-10-CM | POA: Diagnosis not present

## 2015-07-15 DIAGNOSIS — Z79899 Other long term (current) drug therapy: Secondary | ICD-10-CM

## 2015-07-15 DIAGNOSIS — I10 Essential (primary) hypertension: Secondary | ICD-10-CM

## 2015-07-15 DIAGNOSIS — K219 Gastro-esophageal reflux disease without esophagitis: Secondary | ICD-10-CM

## 2015-07-15 DIAGNOSIS — Z85820 Personal history of malignant melanoma of skin: Secondary | ICD-10-CM

## 2015-07-15 DIAGNOSIS — K5790 Diverticulosis of intestine, part unspecified, without perforation or abscess without bleeding: Secondary | ICD-10-CM

## 2015-07-15 DIAGNOSIS — R911 Solitary pulmonary nodule: Secondary | ICD-10-CM | POA: Diagnosis not present

## 2015-07-15 DIAGNOSIS — Z803 Family history of malignant neoplasm of breast: Secondary | ICD-10-CM

## 2015-07-15 DIAGNOSIS — F17210 Nicotine dependence, cigarettes, uncomplicated: Secondary | ICD-10-CM | POA: Insufficient documentation

## 2015-07-15 DIAGNOSIS — Z87442 Personal history of urinary calculi: Secondary | ICD-10-CM

## 2015-07-15 DIAGNOSIS — Z86010 Personal history of colonic polyps: Secondary | ICD-10-CM

## 2015-07-15 DIAGNOSIS — M479 Spondylosis, unspecified: Secondary | ICD-10-CM

## 2015-07-15 DIAGNOSIS — Z800 Family history of malignant neoplasm of digestive organs: Secondary | ICD-10-CM

## 2015-07-15 DIAGNOSIS — F329 Major depressive disorder, single episode, unspecified: Secondary | ICD-10-CM

## 2015-07-15 DIAGNOSIS — E785 Hyperlipidemia, unspecified: Secondary | ICD-10-CM

## 2015-07-15 DIAGNOSIS — K589 Irritable bowel syndrome without diarrhea: Secondary | ICD-10-CM | POA: Diagnosis not present

## 2015-07-15 NOTE — Progress Notes (Signed)
[place] @ [facility] [phone]  Fax:([phone]) [place] OB: 1955-12-06  MR#: [id number]  CBS#:[id number]  Patient Care Team: Jackolyn Confer, MD as PCP - General (Internal Medicine) Earnie Larsson, MD (Neurosurgery) Leeroy Cha, MD (Neurosurgery) Manya Silvas, MD (Gastroenterology) Jonette Mate, MD (General Surgery)  CHIEF COMPLAINT:  No chief complaint on file.  1.  Polypoid mass in the right nasal cavity status post resection consistent with adenocarcinoma with lymphovascular invasion T1 N0 M0 tumor (August of 2016) 2.  PET scan is positive with possibility of tumor in the right ethmoid sinus 3.  Small lung nodule in the right lower lobe and left upper lobe appears to be benign needs follow-up 4.  Patient was started radiation therapy to the involved field and lymph node as per Dr. Donella Stade in September of 2016 VISIT DIAGNOSIS:  , No diagnosis found.  Cancer of the right nasal cavity  No history exists.    Oncology Flowsheet 05/13/2011 08/04/2013 06/25/2015  dexamethasone (DECADRON) IJ - - -  dexamethasone (DECADRON) IV - 10 mg -  diazepam (VALIUM) PO 10 mg - -  LORazepam (ATIVAN) IV - 1 mg -  metoCLOPramide (REGLAN) IV - 10 mg -  ondansetron (ZOFRAN) IM 4 mg - -  ondansetron (ZOFRAN) IV - 4 mg -    INTERVAL HISTORY: \ 59 year old lady who is a chronic smoker started complaining stuffy nose particularly on the right side.  Core.  Of several months.  Patient underwent initial evaluation but as complained continued patient underwent further scope examination.  Which revealed a polypoid mass of the right nasal cavity appears to be coming from the nasal septum.  Patient underwent resection which was adenocarcinoma.  A PET scan is pending.  Patient is here for further evaluation and treatment consideration Patient is here for further follow-up after PET scan. July 15, 2015 Patient is here for ongoing evaluation and  initiation of treatment. Case was discussed in tumor conference.  Increase uptake in meet point sinus was actually at the septal area where the tumor was removed.  There was no abnormality in sinus examination as per Dr. Tami Ribas examination   REVIEW OF SYSTEMS:   GENERAL:  Feels good.  Active.  No fevers, sweats or weight loss. PERFORMANCE STATUS (ECOG):  0 HEENT:  No visual changes, runny nose, sore throat, mouth sores or tenderness. Lungs: No shortness of breath or cough.  No hemoptysis. Cardiac:  No chest pain, palpitations, orthopnea, or PND. GI:  No nausea, vomiting, diarrhea, constipation, melena or hematochezia. GU:  No urgency, frequency, dysuria, or hematuria.  Recent had a hysterectomy Musculoskeletal:  No back pain.  No joint pain.  No muscle tenderness.  Chronic back pain on Flexeril Extremities:  No pain or swelling. Skin:  No rashes or skin changes. Neuro:  No headache, numbness or weakness, balance or coordination issues.  Migraine headache Endocrine:  No diabetes, thyroid issues, hot flashes or night sweats. Psych:  No mood changes, depression or anxiety. Pain:  No focal pain. Review of systems:  All other systems reviewed and found to be negative.  As per HPI. Otherwise, a complete review of systems is negatve.  PAST MEDICAL HISTORY: Past Medical History  Diagnosis Date  . Asthma   . Diverticulosis   . Headaches, cluster   . Colon polyps   . IBS (irritable bowel syndrome)   . Allergic rhinitis   . Arthritis   . Cervical spondylosis   .  Glaucoma   . Kidney stones   . Hyperlipidemia   . Hypertension   . Tobacco abuse   . Depression   . GERD (gastroesophageal reflux disease)   . Cancer     melanoma  . Neuropathy   . PONV (postoperative nausea and vomiting)   . Colitis   . Cancer of nasal cavity and sinus 07/07/2015    PAST SURGICAL HISTORY: Past Surgical History  Procedure Laterality Date  . Appendectomy  1998  . Abdominal hysterectomy  1998  .  Lumbar spine surgery  07/2004  . Vaginal delivery      x2  . Arm fx repair  2001  . Cervical fusion  2010  . Cholecystectomy  08/2011    Dr Rochel Brome  . Esophagogastroduodenoscopy  2012    Dr Tiffany Kocher  . Colonoscopy      Dr Tiffany Kocher  . Tonsillectomy  1968  . Cystoscopy      Dr. Bernardo Heater  . Back surgery    . Dilation and curettage of uterus    . Fracture surgery  2001    plate in right wrist and arm  . Image guided sinus surgery N/A 06/25/2015    Procedure: IMAGE GUIDED SINUS SURGERY;  Surgeon: Beverly Gust, MD;  Location: [facility] ORS;  Service: ENT;  Laterality: N/A;  . Polypectomy N/A 06/25/2015    Procedure: POLYPECTOMY NASAL;  Surgeon: Beverly Gust, MD;  Location: [facility] ORS;  Service: ENT;  Laterality: N/A;    FAMILY HISTORY Family History  Problem Relation Age of Onset  . Hypertension Mother   . Hyperlipidemia Mother   . Diabetes Mother   . Colon cancer Father   . Pancreatic cancer Father   . Cancer Father     Colon & Pancreatic  . Breast cancer Paternal Grandmother   . Diabetes Paternal Grandmother   . Coronary artery disease Paternal Grandmother   . Heart failure Paternal Grandmother   . Cancer Paternal Grandmother     breast  . Stroke Brother     GYNECOLOGIC HISTORY:  No LMP recorded. Patient has had a hysterectomy.     ADVANCED DIRECTIVES:  Patient does not have any living will or healthcare power of attorney.  Information was given .  Available resources had been discussed.  We will follow-up on subsequent appointments regarding this issue  HEALTH MAINTENANCE: Social History  Substance Use Topics  . Smoking status: Current Every Day Smoker -- 0.50 packs/day for 39 years    Types: Cigarettes  . Smokeless tobacco: Never Used     Comment: e cigs currently--trying to quit  . Alcohol Use: No      Allergies  Allergen Reactions  . Albumin (Human) Rash  . Amoxicillin-Pot Clavulanate Hives  . Gabapentin Shortness Of Breath  . Latex Dermatitis and Rash    . Betadine [Povidone Iodine] Dermatitis, Rash and Other (See Comments)    Skin burning Topical betadine and iodine have this reaction with patient.  IV contrast is NOT a problem.  jkl  . Sucralfate     Other reaction(s): Other (See Comments) Abdominal Pain  . Amoxicillin     REACTION: rash  . Chantix [Varenicline Tartrate]     Heart racing  . Erythromycin     REACTION: rash  . Sucralfate Nausea And Vomiting    Heart racing Lightheaded     Current Outpatient Prescriptions  Medication Sig Dispense Refill  . albuterol (PROVENTIL HFA;VENTOLIN HFA) 108 (90 BASE) MCG/ACT inhaler Inhale 2 puffs into the lungs  every 6 (six) hours as needed. 18 g 2  . aspirin EC 81 MG tablet Take 81 mg by mouth daily.    Marland Kitchen aspirin-acetaminophen-caffeine (EXCEDRIN MIGRAINE) 250-250-65 MG per tablet Take 2 tablets by mouth as needed for headache.    Marland Kitchen atenolol (TENORMIN) 25 MG tablet Take 1 tablet (25 mg total) by mouth daily. 90 tablet 3  . cetirizine (ZYRTEC) 10 MG tablet Take 10 mg by mouth daily. Allergies.    . cyclobenzaprine (FLEXERIL) 10 MG tablet Take 1 tablet (10 mg total) by mouth 2 (two) times daily. (Patient taking differently: Take 10 mg by mouth 3 (three) times daily. ) 30 tablet 6  . HYDROcodone-acetaminophen (NORCO/VICODIN) 5-325 MG per tablet Take 1 tablet by mouth every 6 (six) hours as needed for moderate pain. 30 tablet 0  . loperamide (IMODIUM A-D) 2 MG tablet Take 2 mg by mouth 4 (four) times daily as needed for diarrhea or loose stools.    . pantoprazole (PROTONIX) 40 MG tablet Take 1 tablet (40 mg total) by mouth 2 (two) times daily. 60 tablet 11   No current facility-administered medications for this visit.    OBJECTIVE: PHYSICAL EXAM: GENERAL:  Well developed, well nourished, sitting comfortably in the exam room in no acute distress. MENTAL STATUS:  Alert and oriented to person, place and time.  EYES:   Pupils equal round and reactive to light and accomodation.  No  conjunctivitis or scleral icterus. ENT:  Oropharynx clear without lesion.  Tongue normal. Mucous membranes moist.  RESPIRATORY:  Clear to auscultation without rales, wheezes or rhonchi. CARDIOVASCULAR:  Regular rate and rhythm without murmur, rub or gallop.  ABDOMEN:  Soft, non-tender, with active bowel sounds, and no hepatosplenomegaly.  No masses. BACK:  No CVA tenderness.  No tenderness on percussion of the back or rib cage. SKIN:  No rashes, ulcers or lesions. EXTREMITIES: No edema, no skin discoloration or tenderness.  No palpable cords. LYMPH NODES: No palpable cervical, supraclavicular, axillary or inguinal adenopathy  NEUROLOGICAL: Unremarkable. PSYCH:  Appropriate.  There were no vitals filed for this visit.   There is no weight on file to calculate BMI.    ECOG FS:0 - Asymptomatic  LAB RESULTS:  No visits with results within 5 Day(s) from this visit. Latest known visit with results is:  Hospital Outpatient Visit on 07/05/2015  Component Date Value Ref Range Status  . Glucose-Capillary 07/05/2015 81  65 - 99 mg/dL Final     STUDIES: Mr Kizzie Fantasia Contrast  07/06/15   CLINICAL DATA:  Polyp noted in doctor's office. Evaluate for encephalocele.  EXAM: MRI HEAD WITHOUT AND WITH CONTRAST  TECHNIQUE: Multiplanar, multiecho pulse sequences of the brain and surrounding structures were obtained without and with intravenous contrast.  CONTRAST:  52mL MULTIHANCE GADOBENATE DIMEGLUMINE 529 MG/ML IV SOLN  COMPARISON:  08/09/2013  FINDINGS: Calvarium and upper cervical spine: No focal marrow signal abnormality.  Orbits: No significant findings.  Sinuses and Mastoids: Homogeneously enhancing 28 x 14 x 23 mm mass in the right nasal cavity obstructing the right nasal cavity and distorting the posterior middle and inferior turbinates. Presence of bony destruction would be better assessed by CT, though for size the mass has overall benign features and large polyp is favored. There is no antral  component. Question if polyp was developing in 2014 when only mild mucosal thickening was seen in the upper right nasal cavity. Concerning the possibility of cephalocele, there is no evidence of floor discontinuity or distortion of  the inferior frontal lobes. On a 2014 head CT, there was no gross bony defect or remote fracture. If strong clinical concern for cephalocele, thin section facial CT could evaluate the floor of the anterior cranial fossa.  Brain: No acute or remote infarct, hemorrhage, hydrocephalus, or mass lesion. No evidence of large vessel occlusion.  There is moderate white matter disease for age, with the periventricular and deep patchy T2 and FLAIR hyperintense signal abnormalities. Pattern and distribution is stable from 2014.  IMPRESSION: 1. Polypoid 3 cm obstructing right nasal cavity mass. No indication of cephalocele. Further description above. 2. Moderate white matter disease which could be from chronic small vessel ischemia or demyelination, stable from 2014.   Electronically Signed   By: Monte Fantasia M.D.   On: 06/18/2015 09:30   Nm Pet Image Initial (pi) Skull Base To Thigh  07/05/2015   CLINICAL DATA:  Initial treatment strategy for adenocarcinoma of a nasal polyp.  EXAM: NUCLEAR MEDICINE PET SKULL BASE TO THIGH  TECHNIQUE: The re- clinical to the mCi F-18 FDG was injected intravenously. Full-ring PET imaging was performed from the skull base to thigh after the radiotracer. CT data was obtained and used for attenuation correction and anatomic localization.  FASTING BLOOD GLUCOSE:  Value: 81 mg/dl  COMPARISON:  Brain MRI 06/18/2015,  FINDINGS: NECK  No hypermetabolic lymph nodes in the neck. The masses within the RIGHT ethmoid sinus is hypermetabolic with SUV max 6.2.  CHEST  No hypermetabolic mediastinal lymph nodes. No hypermetabolic pulmonary nodules.  1 mm pulmonary nodule in the LEFT upper lobe on image 75, series 3. Calcified nodule in the RIGHT lower lobe on image 86.   ABDOMEN/PELVIS  No abnormal hypermetabolic activity within the liver, pancreas, adrenal glands, or spleen. No hypermetabolic lymph nodes in the abdomen or pelvis.  SKELETON  No focal hypermetabolic activity to suggest skeletal metastasis.  IMPRESSION: 1. Hypermetabolic lesion in the RIGHT ethmoid sinus consistent with biopsy-proven adenocarcinoma. 2. No evidence of local nodal metastasis in the neck. 3. No evidence distant metastasis. 4. Small 1 mm LEFT upper lobe pulmonary nodule is likely benign.   Electronically Signed   By: Suzy Bouchard M.D.   On: 07/05/2015 11:06    ASSESSMENT:  1.  Right sino  Nasal carcinoma.  Adenocarcinoma with lymphovascular invasion\ PET scan has been reviewed independently sews hypermetabolic region of the right ethmoid sinus consistent with biopsy-proven adenocarcinoma.  No evidence of local nodal metastasis disease. 2.  Small pulmonary nodule needs to be followed   T1NOMO Stage I cancer PLAN:   Case will be discussed in tumor conference PET scan has been reviewed.  There is increased uptake in the right ethmoid sinus probably patient's adenocarcinoma started in the right ethmoid sinus.  We discussed this case in tumor conference possibility of further exploration of the sinus by ENT surgeon versus possibility of radiation and chemotherapy weekly by cis-platinum can be considered. All these options would be discussed Patient would be evaluated by ENT surgeon Dr. Tami Ribas as well as by Dr. Donella Stade after discussing case in tumor conference in reviewing pathology. 2.  Regarding smoking cessation  I had prolonged discussion. Patient is allergic to Chantix.  Patient was referred to lifestyle Center. information was given.. 3.  Lung nodule which needs to be followed up with another CT scan July 15, 2015 After prolonged discussion with the patient in discussion in tumor conference I discussed situation with Dr. Donella Stade and Dr. Tami Ribas Tumor appears to be stage I  with  lymphovascular invasion. Margins are clear There is no residual tumor Possibility of adding chemotherapy may not add too much benefit in this group of patient at this time. Patient received radiation therapy for local control Continue observation with repeat PET scan in several months This has been discussed in detail. Lung nodule is 3 followed with another's CT scan in 6 month We again discussed possibility of's cessation of smoking Total duration of visit was 30 minutes.  50% or more time was spent in counseling patient and family regarding prognosis and options of treatment and available resources   Patient expressed understanding and was in agreement with this plan. She also understands that She can call clinic at any time with any questions, concerns, or complaints.    No matching staging information was found for the patient.  Forest Gleason, MD   07/15/2015 10:29 AM

## 2015-07-15 NOTE — Consult Note (Signed)
Except an outstanding is perfect of Radiation Oncology NEW PATIENT EVALUATION  Name: Heather Beasley  MRN: [id number]  Date:   07/15/2015     DOB: 06-07-1956   This 59 y.o. 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  REFERRING PHYSICIAN: Jackolyn Confer, MD  CHIEF COMPLAINT:  Chief Complaint  Patient presents with  . Cancer    Patient is here for initial consultation of cancer of nasal sinus.      DIAGNOSIS: The encounter diagnosis was Cancer of nasal cavity and sinus.   PREVIOUS INVESTIGATIONS:  MRI scans PET/CT scans are reviewed Surgical pathology report reviewed Clinical notes reviewed Case presented at weekly tumor conference  HPI: Patient is a 59 year old female who noticed asymmetric stuffiness of her right nasal cavity over the past several months. Eventually she sought the attention ENT noticed a right nasal cavity mass appearing to arise from the nasal septum. MRI scan showed a polypoid 3 cm mass obstructing the right nasal cavity. She underwent a resection showing interestingly enough adenocarcinoma with angiolymphatic invasion present. Margins were not able to be assessed. Tumor was about 4 cm in greatest dimension. Postoperatively PET CT scan was performed showing hypermetabolic lesion in the right ethmoid sinus consistent with known biopsy-proven adenocarcinoma. No evidence of gross disease in her neck nodes was noted. She did have a small 1 mm left upper lobe pulmonary nodule likely benign. She has done well postoperatively. His having no head and neck pain no dysphagia or nasal stuffiness at this time. Her case was presented at our weekly tumor conference and based on angiolymphatic invasion the adenocarcinoma nature, invasion of the ethmoid sinus, and difficulty assessing margins recommendation for postoperative radiation and possible chemotherapy was maintained. She is  seen today for consultation.  PLANNED TREATMENT REGIMEN: I MRT radiation therapy with concurrent chemotherapy  PAST MEDICAL HISTORY:  has a past medical history of Asthma; Diverticulosis; Headaches, cluster; Colon polyps; IBS (irritable bowel syndrome); Allergic rhinitis; Arthritis; Cervical spondylosis; Glaucoma; Kidney stones; Hyperlipidemia; Hypertension; Tobacco abuse; Depression; GERD (gastroesophageal reflux disease); Cancer; Neuropathy; PONV (postoperative nausea and vomiting); Colitis; and Cancer of nasal cavity and sinus (07/07/2015).    PAST SURGICAL HISTORY:  Past Surgical History  Procedure Laterality Date  . Appendectomy  1998  . Abdominal hysterectomy  1998  . Lumbar spine surgery  07/2004  . Vaginal delivery      x2  . Arm fx repair  2001  . Cervical fusion  2010  . Cholecystectomy  08/2011    Dr Rochel Brome  . Esophagogastroduodenoscopy  2012    Dr Tiffany Kocher  . Colonoscopy      Dr Tiffany Kocher  . Tonsillectomy  1968  . Cystoscopy      Dr. Bernardo Heater  . Back surgery    . Dilation and curettage of uterus    . Fracture surgery  2001    plate in right wrist and arm  . Image guided sinus surgery N/A 06/25/2015    Procedure: IMAGE GUIDED SINUS SURGERY;  Surgeon: Beverly Gust, MD;  Location: [facility] ORS;  Service: ENT;  Laterality: N/A;  . Polypectomy N/A 06/25/2015    Procedure: POLYPECTOMY NASAL;  Surgeon: Beverly Gust, MD;  Location: [facility] ORS;  Service: ENT;  Laterality: N/A;    FAMILY HISTORY: family history includes Breast cancer in her paternal grandmother; Cancer in her father and paternal grandmother; Colon cancer in her father; Coronary artery disease  in her paternal grandmother; Diabetes in her mother and paternal grandmother; Heart failure in her paternal grandmother; Hyperlipidemia in her mother; Hypertension in her mother; Pancreatic cancer in her father; Stroke in her brother.  SOCIAL HISTORY:  reports that she has been smoking Cigarettes.  She has a 19.5 pack-year  smoking history. She has never used smokeless tobacco. She reports that she does not drink alcohol or use illicit drugs.  ALLERGIES: Albumin (human); Amoxicillin-pot clavulanate; Gabapentin; Latex; Betadine; Sucralfate; Amoxicillin; Chantix; Erythromycin; and Sucralfate  MEDICATIONS:  Current Outpatient Prescriptions  Medication Sig Dispense Refill  . albuterol (PROVENTIL HFA;VENTOLIN HFA) 108 (90 BASE) MCG/ACT inhaler Inhale 2 puffs into the lungs every 6 (six) hours as needed. 18 g 2  . aspirin EC 81 MG tablet Take 81 mg by mouth daily.    Marland Kitchen aspirin-acetaminophen-caffeine (EXCEDRIN MIGRAINE) 250-250-65 MG per tablet Take 2 tablets by mouth as needed for headache.    Marland Kitchen atenolol (TENORMIN) 25 MG tablet Take 1 tablet (25 mg total) by mouth daily. 90 tablet 3  . cetirizine (ZYRTEC) 10 MG tablet Take 10 mg by mouth daily. Allergies.    . cyclobenzaprine (FLEXERIL) 10 MG tablet Take 1 tablet (10 mg total) by mouth 2 (two) times daily. (Patient taking differently: Take 10 mg by mouth 3 (three) times daily. ) 30 tablet 6  . HYDROcodone-acetaminophen (NORCO/VICODIN) 5-325 MG per tablet Take 1 tablet by mouth every 6 (six) hours as needed for moderate pain. 30 tablet 0  . loperamide (IMODIUM A-D) 2 MG tablet Take 2 mg by mouth 4 (four) times daily as needed for diarrhea or loose stools.    . pantoprazole (PROTONIX) 40 MG tablet Take 1 tablet (40 mg total) by mouth 2 (two) times daily. 60 tablet 11   No current facility-administered medications for this encounter.    ECOG PERFORMANCE STATUS:  0 - Asymptomatic  REVIEW OF SYSTEMS:  Patient denies any weight loss, fatigue, weakness, fever, chills or night sweats. Patient denies any loss of vision, blurred vision. Patient denies any ringing  of the ears or hearing loss. No irregular heartbeat. Patient denies heart murmur or history of fainting. Patient denies any chest pain or pain radiating to her upper extremities. Patient denies any shortness of breath,  difficulty breathing at night, cough or hemoptysis. Patient denies any swelling in the lower legs. Patient denies any nausea vomiting, vomiting of blood, or coffee ground material in the vomitus. Patient denies any stomach pain. Patient states has had normal bowel movements no significant constipation or diarrhea. Patient denies any dysuria, hematuria or significant nocturia. Patient denies any problems walking, swelling in the joints or loss of balance. Patient denies any skin changes, loss of hair or loss of weight. Patient denies any excessive worrying or anxiety or significant depression. Patient denies any problems with insomnia. Patient denies excessive thirst, polyuria, polydipsia. Patient denies any swollen glands, patient denies easy bruising or easy bleeding. Patient denies any recent infections, allergies or URI. Patient "s visual fields have not changed significantly in recent time.    PHYSICAL EXAM: BP 132/80 mmHg  Pulse 75  Temp(Src) 95.4 F (35.2 C)  Resp 18  Wt 166 lb 1.9 oz (75.35 kg) A well-developed female in NAD. No evidence of mass in the nasal septum is noted today. Oral cavity is clear no oral mucosal lesions are identified. Indirect mirror examination shows upper airway clear vallecula and base of tongue within normal limits. Neck is clear without evidence of subject gastric cervical or supraclavicular  adenopathy. Well-developed well-nourished patient in NAD. HEENT reveals PERLA, EOMI, discs not visualized.  Oral cavity is clear. No oral mucosal lesions are identified. Neck is clear without evidence of cervical or supraclavicular adenopathy. Lungs are clear to A&P. Cardiac examination is essentially unremarkable with regular rate and rhythm without murmur rub or thrill. Abdomen is benign with no organomegaly or masses noted. Motor sensory and DTR levels are equal and symmetric in the upper and lower extremities. Cranial nerves II through XII are grossly intact. Proprioception is  intact. No peripheral adenopathy or edema is identified. No motor or sensory levels are noted. Crude visual fields are within normal range.   LABORATORY DATA: Surgical pathology reports reviewed    RADIOLOGY RESULTS: MRI scans PET CT scans all reviewed   IMPRESSION: Stage III adenocarcinoma the nasal septum with invasion of the ethmoid sinus in 59 year old female status post gross resection  PLAN: At this time based on the above-stated factors and risk probabilities would advocate for adjuvant I MRT radiation therapy. I would target the area of hypermetabolic activity in the ethmoid sinus an area of original tumor volume treating that to 7000 cGy over 7 weeks. I would treat her remaining lymph nodes in her neck and base of skull to 5400 cGy usingIMRT dose painting technique. Risks and benefits of treatment including alteration of taste, possible dryness of the mouth, dysphasia, skin reaction, fatigue, alteration of blood counts, all were discussed in detail with the patient. I have set up and ordered CT simulation later this week. We'll coordinate her chemotherapy with medical oncology as she will be likely receiving weekly cisplatin therapy.  I would like to take this opportunity for allowing me to participate in the care of your patient.Armstead Peaks., MD

## 2015-07-17 ENCOUNTER — Ambulatory Visit: Payer: Medicare PPO

## 2015-07-18 DIAGNOSIS — J301 Allergic rhinitis due to pollen: Secondary | ICD-10-CM | POA: Diagnosis not present

## 2015-07-18 DIAGNOSIS — C300 Malignant neoplasm of nasal cavity: Secondary | ICD-10-CM | POA: Diagnosis not present

## 2015-07-19 ENCOUNTER — Ambulatory Visit
Admission: RE | Admit: 2015-07-19 | Discharge: 2015-07-19 | Disposition: A | Discharge status: Home / Self Care | Payer: Medicare PPO | Source: Ambulatory Visit | Attending: Radiation Oncology | Admitting: Radiation Oncology

## 2015-07-19 DIAGNOSIS — C311 Malignant neoplasm of ethmoidal sinus: Secondary | ICD-10-CM | POA: Diagnosis not present

## 2015-07-19 DIAGNOSIS — F17210 Nicotine dependence, cigarettes, uncomplicated: Secondary | ICD-10-CM | POA: Diagnosis not present

## 2015-07-19 DIAGNOSIS — J301 Allergic rhinitis due to pollen: Secondary | ICD-10-CM | POA: Diagnosis not present

## 2015-07-19 DIAGNOSIS — Z510 Encounter for antineoplastic radiation therapy: Secondary | ICD-10-CM | POA: Diagnosis not present

## 2015-07-19 DIAGNOSIS — C300 Malignant neoplasm of nasal cavity: Secondary | ICD-10-CM | POA: Diagnosis not present

## 2015-07-22 DIAGNOSIS — J301 Allergic rhinitis due to pollen: Secondary | ICD-10-CM | POA: Diagnosis not present

## 2015-07-25 DIAGNOSIS — J301 Allergic rhinitis due to pollen: Secondary | ICD-10-CM | POA: Diagnosis not present

## 2015-07-29 ENCOUNTER — Other Ambulatory Visit: Payer: Self-pay | Admitting: *Deleted

## 2015-07-29 DIAGNOSIS — C311 Malignant neoplasm of ethmoidal sinus: Secondary | ICD-10-CM

## 2015-07-29 DIAGNOSIS — J301 Allergic rhinitis due to pollen: Secondary | ICD-10-CM | POA: Diagnosis not present

## 2015-07-30 DIAGNOSIS — F17210 Nicotine dependence, cigarettes, uncomplicated: Secondary | ICD-10-CM | POA: Diagnosis not present

## 2015-07-30 DIAGNOSIS — C311 Malignant neoplasm of ethmoidal sinus: Secondary | ICD-10-CM | POA: Diagnosis not present

## 2015-07-30 DIAGNOSIS — C300 Malignant neoplasm of nasal cavity: Secondary | ICD-10-CM | POA: Diagnosis not present

## 2015-07-30 DIAGNOSIS — Z510 Encounter for antineoplastic radiation therapy: Secondary | ICD-10-CM | POA: Diagnosis not present

## 2015-07-31 ENCOUNTER — Ambulatory Visit
Admission: RE | Admit: 2015-07-31 | Discharge: 2015-07-31 | Disposition: A | Discharge status: Home / Self Care | Payer: Medicare PPO | Source: Ambulatory Visit | Attending: Radiation Oncology | Admitting: Radiation Oncology

## 2015-07-31 DIAGNOSIS — C311 Malignant neoplasm of ethmoidal sinus: Secondary | ICD-10-CM | POA: Diagnosis not present

## 2015-07-31 DIAGNOSIS — C300 Malignant neoplasm of nasal cavity: Secondary | ICD-10-CM | POA: Diagnosis not present

## 2015-07-31 DIAGNOSIS — Z510 Encounter for antineoplastic radiation therapy: Secondary | ICD-10-CM | POA: Diagnosis not present

## 2015-07-31 DIAGNOSIS — F17210 Nicotine dependence, cigarettes, uncomplicated: Secondary | ICD-10-CM | POA: Diagnosis not present

## 2015-07-31 DIAGNOSIS — Z23 Encounter for immunization: Secondary | ICD-10-CM | POA: Diagnosis not present

## 2015-07-31 DIAGNOSIS — J301 Allergic rhinitis due to pollen: Secondary | ICD-10-CM | POA: Diagnosis not present

## 2015-08-01 ENCOUNTER — Ambulatory Visit
Admission: RE | Admit: 2015-08-01 | Discharge: 2015-08-01 | Disposition: A | Discharge status: Home / Self Care | Payer: Medicare PPO | Source: Ambulatory Visit | Attending: Radiation Oncology | Admitting: Radiation Oncology

## 2015-08-01 ENCOUNTER — Ambulatory Visit (INDEPENDENT_AMBULATORY_CARE_PROVIDER_SITE_OTHER): Payer: Medicare PPO | Admitting: Surgical

## 2015-08-01 DIAGNOSIS — J301 Allergic rhinitis due to pollen: Secondary | ICD-10-CM | POA: Diagnosis not present

## 2015-08-01 DIAGNOSIS — Z23 Encounter for immunization: Secondary | ICD-10-CM

## 2015-08-01 DIAGNOSIS — C300 Malignant neoplasm of nasal cavity: Secondary | ICD-10-CM | POA: Diagnosis not present

## 2015-08-01 DIAGNOSIS — C311 Malignant neoplasm of ethmoidal sinus: Secondary | ICD-10-CM | POA: Diagnosis not present

## 2015-08-01 DIAGNOSIS — F17210 Nicotine dependence, cigarettes, uncomplicated: Secondary | ICD-10-CM | POA: Diagnosis not present

## 2015-08-01 DIAGNOSIS — Z510 Encounter for antineoplastic radiation therapy: Secondary | ICD-10-CM | POA: Diagnosis not present

## 2015-08-02 ENCOUNTER — Ambulatory Visit
Admission: RE | Admit: 2015-08-02 | Discharge: 2015-08-02 | Disposition: A | Discharge status: Home / Self Care | Payer: Medicare PPO | Source: Ambulatory Visit | Attending: Radiation Oncology | Admitting: Radiation Oncology

## 2015-08-02 DIAGNOSIS — Z510 Encounter for antineoplastic radiation therapy: Secondary | ICD-10-CM | POA: Diagnosis not present

## 2015-08-02 DIAGNOSIS — F17210 Nicotine dependence, cigarettes, uncomplicated: Secondary | ICD-10-CM | POA: Diagnosis not present

## 2015-08-02 DIAGNOSIS — C311 Malignant neoplasm of ethmoidal sinus: Secondary | ICD-10-CM | POA: Diagnosis not present

## 2015-08-02 DIAGNOSIS — C300 Malignant neoplasm of nasal cavity: Secondary | ICD-10-CM | POA: Diagnosis not present

## 2015-08-05 ENCOUNTER — Ambulatory Visit
Admission: RE | Admit: 2015-08-05 | Discharge: 2015-08-05 | Disposition: A | Discharge status: Home / Self Care | Payer: Medicare PPO | Source: Ambulatory Visit | Attending: Radiation Oncology | Admitting: Radiation Oncology

## 2015-08-05 DIAGNOSIS — C311 Malignant neoplasm of ethmoidal sinus: Secondary | ICD-10-CM | POA: Diagnosis not present

## 2015-08-05 DIAGNOSIS — J301 Allergic rhinitis due to pollen: Secondary | ICD-10-CM | POA: Diagnosis not present

## 2015-08-05 DIAGNOSIS — F17210 Nicotine dependence, cigarettes, uncomplicated: Secondary | ICD-10-CM | POA: Diagnosis not present

## 2015-08-05 DIAGNOSIS — C300 Malignant neoplasm of nasal cavity: Secondary | ICD-10-CM | POA: Diagnosis not present

## 2015-08-05 DIAGNOSIS — Z510 Encounter for antineoplastic radiation therapy: Secondary | ICD-10-CM | POA: Diagnosis not present

## 2015-08-06 ENCOUNTER — Ambulatory Visit
Admission: RE | Admit: 2015-08-06 | Discharge: 2015-08-06 | Disposition: A | Discharge status: Home / Self Care | Payer: Medicare PPO | Source: Ambulatory Visit | Attending: Radiation Oncology | Admitting: Radiation Oncology

## 2015-08-06 ENCOUNTER — Other Ambulatory Visit: Payer: Self-pay | Admitting: *Deleted

## 2015-08-06 DIAGNOSIS — C311 Malignant neoplasm of ethmoidal sinus: Secondary | ICD-10-CM | POA: Diagnosis not present

## 2015-08-06 DIAGNOSIS — C300 Malignant neoplasm of nasal cavity: Secondary | ICD-10-CM | POA: Diagnosis not present

## 2015-08-06 DIAGNOSIS — F17210 Nicotine dependence, cigarettes, uncomplicated: Secondary | ICD-10-CM | POA: Diagnosis not present

## 2015-08-06 DIAGNOSIS — Z510 Encounter for antineoplastic radiation therapy: Secondary | ICD-10-CM | POA: Diagnosis not present

## 2015-08-06 MED ORDER — ONDANSETRON HCL 4 MG PO TABS: 8.0000 mg | ORAL_TABLET | Freq: Four times a day (QID) | ORAL | Status: DC | PRN

## 2015-08-06 MED ORDER — ONDANSETRON HCL 4 MG PO TABS: 4.0000 mg | ORAL_TABLET | Freq: Four times a day (QID) | ORAL | Status: DC | PRN

## 2015-08-07 ENCOUNTER — Ambulatory Visit
Admission: RE | Admit: 2015-08-07 | Discharge: 2015-08-07 | Disposition: A | Discharge status: Home / Self Care | Payer: Medicare PPO | Source: Ambulatory Visit | Attending: Radiation Oncology | Admitting: Radiation Oncology

## 2015-08-07 ENCOUNTER — Other Ambulatory Visit: Payer: Self-pay | Admitting: *Deleted

## 2015-08-07 DIAGNOSIS — J301 Allergic rhinitis due to pollen: Secondary | ICD-10-CM | POA: Diagnosis not present

## 2015-08-07 DIAGNOSIS — F17210 Nicotine dependence, cigarettes, uncomplicated: Secondary | ICD-10-CM | POA: Diagnosis not present

## 2015-08-07 DIAGNOSIS — C311 Malignant neoplasm of ethmoidal sinus: Secondary | ICD-10-CM | POA: Diagnosis not present

## 2015-08-07 DIAGNOSIS — Z510 Encounter for antineoplastic radiation therapy: Secondary | ICD-10-CM | POA: Diagnosis not present

## 2015-08-07 DIAGNOSIS — C300 Malignant neoplasm of nasal cavity: Secondary | ICD-10-CM | POA: Diagnosis not present

## 2015-08-07 MED ORDER — VENLAFAXINE HCL 37.5 MG PO TABS: 37.5000 mg | ORAL_TABLET | Freq: Every day | ORAL | Status: DC

## 2015-08-07 MED ORDER — VENLAFAXINE HCL 37.5 MG PO TABS: 37.5000 mg | ORAL_TABLET | Freq: Two times a day (BID) | ORAL | Status: DC

## 2015-08-07 MED ORDER — FLUCONAZOLE 100 MG PO TABS: 100.0000 mg | ORAL_TABLET | Freq: Every day | ORAL | Status: DC

## 2015-08-08 ENCOUNTER — Ambulatory Visit
Admission: RE | Admit: 2015-08-08 | Discharge: 2015-08-08 | Disposition: A | Discharge status: Home / Self Care | Payer: Medicare PPO | Source: Ambulatory Visit | Attending: Radiation Oncology | Admitting: Radiation Oncology

## 2015-08-08 DIAGNOSIS — C311 Malignant neoplasm of ethmoidal sinus: Secondary | ICD-10-CM | POA: Diagnosis not present

## 2015-08-08 DIAGNOSIS — C300 Malignant neoplasm of nasal cavity: Secondary | ICD-10-CM | POA: Diagnosis not present

## 2015-08-08 DIAGNOSIS — F17210 Nicotine dependence, cigarettes, uncomplicated: Secondary | ICD-10-CM | POA: Diagnosis not present

## 2015-08-08 DIAGNOSIS — J301 Allergic rhinitis due to pollen: Secondary | ICD-10-CM | POA: Diagnosis not present

## 2015-08-08 DIAGNOSIS — Z510 Encounter for antineoplastic radiation therapy: Secondary | ICD-10-CM | POA: Diagnosis not present

## 2015-08-09 ENCOUNTER — Ambulatory Visit
Admission: RE | Admit: 2015-08-09 | Discharge: 2015-08-09 | Disposition: A | Discharge status: Home / Self Care | Payer: Medicare PPO | Source: Ambulatory Visit | Attending: Radiation Oncology | Admitting: Radiation Oncology

## 2015-08-09 ENCOUNTER — Inpatient Hospital Stay: Payer: Medicare PPO | Attending: Oncology

## 2015-08-09 DIAGNOSIS — Z510 Encounter for antineoplastic radiation therapy: Secondary | ICD-10-CM | POA: Diagnosis not present

## 2015-08-09 DIAGNOSIS — C311 Malignant neoplasm of ethmoidal sinus: Secondary | ICD-10-CM

## 2015-08-09 DIAGNOSIS — C300 Malignant neoplasm of nasal cavity: Secondary | ICD-10-CM | POA: Insufficient documentation

## 2015-08-09 DIAGNOSIS — F17210 Nicotine dependence, cigarettes, uncomplicated: Secondary | ICD-10-CM | POA: Diagnosis not present

## 2015-08-09 LAB — CBC
HCT: 43.5 % (ref 35.0–47.0)
Hemoglobin: 14.4 g/dL (ref 12.0–16.0)
MCH: 31.5 pg (ref 26.0–34.0)
MCHC: 33.1 g/dL (ref 32.0–36.0)
MCV: 95.3 fL (ref 80.0–100.0)
PLATELETS: 174 10*3/uL (ref 150–440)
RBC: 4.56 MIL/uL (ref 3.80–5.20)
RDW: 13.4 % (ref 11.5–14.5)
WBC: 3.3 10*3/uL — AB (ref 3.6–11.0)

## 2015-08-12 ENCOUNTER — Ambulatory Visit
Admission: RE | Admit: 2015-08-12 | Discharge: 2015-08-12 | Disposition: A | Discharge status: Home / Self Care | Payer: Medicare PPO | Source: Ambulatory Visit | Attending: Radiation Oncology | Admitting: Radiation Oncology

## 2015-08-12 DIAGNOSIS — J301 Allergic rhinitis due to pollen: Secondary | ICD-10-CM | POA: Diagnosis not present

## 2015-08-12 DIAGNOSIS — F17210 Nicotine dependence, cigarettes, uncomplicated: Secondary | ICD-10-CM | POA: Diagnosis not present

## 2015-08-12 DIAGNOSIS — C311 Malignant neoplasm of ethmoidal sinus: Secondary | ICD-10-CM | POA: Diagnosis not present

## 2015-08-12 DIAGNOSIS — C300 Malignant neoplasm of nasal cavity: Secondary | ICD-10-CM | POA: Diagnosis not present

## 2015-08-12 DIAGNOSIS — Z510 Encounter for antineoplastic radiation therapy: Secondary | ICD-10-CM | POA: Diagnosis not present

## 2015-08-13 ENCOUNTER — Ambulatory Visit
Admission: RE | Admit: 2015-08-13 | Discharge: 2015-08-13 | Disposition: A | Discharge status: Home / Self Care | Payer: Medicare PPO | Source: Ambulatory Visit | Attending: Radiation Oncology | Admitting: Radiation Oncology

## 2015-08-13 DIAGNOSIS — C300 Malignant neoplasm of nasal cavity: Secondary | ICD-10-CM | POA: Diagnosis not present

## 2015-08-13 DIAGNOSIS — F17210 Nicotine dependence, cigarettes, uncomplicated: Secondary | ICD-10-CM | POA: Diagnosis not present

## 2015-08-13 DIAGNOSIS — C311 Malignant neoplasm of ethmoidal sinus: Secondary | ICD-10-CM | POA: Diagnosis not present

## 2015-08-13 DIAGNOSIS — Z510 Encounter for antineoplastic radiation therapy: Secondary | ICD-10-CM | POA: Diagnosis not present

## 2015-08-14 ENCOUNTER — Ambulatory Visit
Admission: RE | Admit: 2015-08-14 | Discharge: 2015-08-14 | Disposition: A | Discharge status: Home / Self Care | Payer: Medicare PPO | Source: Ambulatory Visit | Attending: Radiation Oncology | Admitting: Radiation Oncology

## 2015-08-14 DIAGNOSIS — C300 Malignant neoplasm of nasal cavity: Secondary | ICD-10-CM | POA: Diagnosis not present

## 2015-08-14 DIAGNOSIS — C311 Malignant neoplasm of ethmoidal sinus: Secondary | ICD-10-CM | POA: Diagnosis not present

## 2015-08-14 DIAGNOSIS — F17210 Nicotine dependence, cigarettes, uncomplicated: Secondary | ICD-10-CM | POA: Diagnosis not present

## 2015-08-14 DIAGNOSIS — Z510 Encounter for antineoplastic radiation therapy: Secondary | ICD-10-CM | POA: Diagnosis not present

## 2015-08-15 ENCOUNTER — Other Ambulatory Visit: Payer: Self-pay | Admitting: *Deleted

## 2015-08-15 ENCOUNTER — Ambulatory Visit
Admission: RE | Admit: 2015-08-15 | Discharge: 2015-08-15 | Disposition: A | Discharge status: Home / Self Care | Payer: Medicare PPO | Source: Ambulatory Visit | Attending: Radiation Oncology | Admitting: Radiation Oncology

## 2015-08-15 DIAGNOSIS — Z510 Encounter for antineoplastic radiation therapy: Secondary | ICD-10-CM | POA: Diagnosis not present

## 2015-08-15 DIAGNOSIS — C300 Malignant neoplasm of nasal cavity: Secondary | ICD-10-CM | POA: Diagnosis not present

## 2015-08-15 DIAGNOSIS — C311 Malignant neoplasm of ethmoidal sinus: Secondary | ICD-10-CM | POA: Diagnosis not present

## 2015-08-15 DIAGNOSIS — F17210 Nicotine dependence, cigarettes, uncomplicated: Secondary | ICD-10-CM | POA: Diagnosis not present

## 2015-08-15 DIAGNOSIS — J301 Allergic rhinitis due to pollen: Secondary | ICD-10-CM | POA: Diagnosis not present

## 2015-08-15 MED ORDER — DEXAMETHASONE 4 MG PO TABS: 4.0000 mg | ORAL_TABLET | Freq: Every day | ORAL | Status: DC

## 2015-08-16 ENCOUNTER — Ambulatory Visit: Payer: Medicare PPO

## 2015-08-16 ENCOUNTER — Inpatient Hospital Stay: Payer: Medicare PPO

## 2015-08-16 ENCOUNTER — Ambulatory Visit
Admission: RE | Admit: 2015-08-16 | Discharge: 2015-08-16 | Disposition: A | Discharge status: Home / Self Care | Payer: Medicare PPO | Source: Ambulatory Visit | Attending: Radiation Oncology | Admitting: Radiation Oncology

## 2015-08-16 DIAGNOSIS — J301 Allergic rhinitis due to pollen: Secondary | ICD-10-CM | POA: Diagnosis not present

## 2015-08-19 ENCOUNTER — Ambulatory Visit
Admission: RE | Admit: 2015-08-19 | Discharge: 2015-08-19 | Disposition: A | Discharge status: Home / Self Care | Payer: Medicare PPO | Source: Ambulatory Visit | Attending: Radiation Oncology | Admitting: Radiation Oncology

## 2015-08-19 DIAGNOSIS — Z510 Encounter for antineoplastic radiation therapy: Secondary | ICD-10-CM | POA: Diagnosis not present

## 2015-08-19 DIAGNOSIS — C311 Malignant neoplasm of ethmoidal sinus: Secondary | ICD-10-CM | POA: Diagnosis not present

## 2015-08-19 DIAGNOSIS — C300 Malignant neoplasm of nasal cavity: Secondary | ICD-10-CM | POA: Diagnosis not present

## 2015-08-19 DIAGNOSIS — J301 Allergic rhinitis due to pollen: Secondary | ICD-10-CM | POA: Diagnosis not present

## 2015-08-19 DIAGNOSIS — F17210 Nicotine dependence, cigarettes, uncomplicated: Secondary | ICD-10-CM | POA: Diagnosis not present

## 2015-08-20 ENCOUNTER — Ambulatory Visit
Admission: RE | Admit: 2015-08-20 | Discharge: 2015-08-20 | Disposition: A | Discharge status: Home / Self Care | Payer: Medicare PPO | Source: Ambulatory Visit | Attending: Radiation Oncology | Admitting: Radiation Oncology

## 2015-08-20 DIAGNOSIS — C300 Malignant neoplasm of nasal cavity: Secondary | ICD-10-CM | POA: Diagnosis not present

## 2015-08-20 DIAGNOSIS — C311 Malignant neoplasm of ethmoidal sinus: Secondary | ICD-10-CM | POA: Diagnosis not present

## 2015-08-20 DIAGNOSIS — Z510 Encounter for antineoplastic radiation therapy: Secondary | ICD-10-CM | POA: Diagnosis not present

## 2015-08-20 DIAGNOSIS — F17210 Nicotine dependence, cigarettes, uncomplicated: Secondary | ICD-10-CM | POA: Diagnosis not present

## 2015-08-21 ENCOUNTER — Ambulatory Visit
Admission: RE | Admit: 2015-08-21 | Discharge: 2015-08-21 | Disposition: A | Discharge status: Home / Self Care | Payer: Medicare PPO | Source: Ambulatory Visit | Attending: Radiation Oncology | Admitting: Radiation Oncology

## 2015-08-21 DIAGNOSIS — C311 Malignant neoplasm of ethmoidal sinus: Secondary | ICD-10-CM | POA: Diagnosis not present

## 2015-08-21 DIAGNOSIS — C300 Malignant neoplasm of nasal cavity: Secondary | ICD-10-CM | POA: Diagnosis not present

## 2015-08-21 DIAGNOSIS — F17210 Nicotine dependence, cigarettes, uncomplicated: Secondary | ICD-10-CM | POA: Diagnosis not present

## 2015-08-21 DIAGNOSIS — Z510 Encounter for antineoplastic radiation therapy: Secondary | ICD-10-CM | POA: Diagnosis not present

## 2015-08-21 DIAGNOSIS — J301 Allergic rhinitis due to pollen: Secondary | ICD-10-CM | POA: Diagnosis not present

## 2015-08-22 ENCOUNTER — Ambulatory Visit
Admission: RE | Admit: 2015-08-22 | Discharge: 2015-08-22 | Disposition: A | Discharge status: Home / Self Care | Payer: Medicare PPO | Source: Ambulatory Visit | Attending: Radiation Oncology | Admitting: Radiation Oncology

## 2015-08-22 DIAGNOSIS — Z510 Encounter for antineoplastic radiation therapy: Secondary | ICD-10-CM | POA: Diagnosis not present

## 2015-08-22 DIAGNOSIS — C300 Malignant neoplasm of nasal cavity: Secondary | ICD-10-CM | POA: Diagnosis not present

## 2015-08-22 DIAGNOSIS — J301 Allergic rhinitis due to pollen: Secondary | ICD-10-CM | POA: Diagnosis not present

## 2015-08-22 DIAGNOSIS — F17210 Nicotine dependence, cigarettes, uncomplicated: Secondary | ICD-10-CM | POA: Diagnosis not present

## 2015-08-23 ENCOUNTER — Ambulatory Visit
Admission: RE | Admit: 2015-08-23 | Discharge: 2015-08-23 | Disposition: A | Discharge status: Home / Self Care | Payer: Medicare PPO | Source: Ambulatory Visit | Attending: Radiation Oncology | Admitting: Radiation Oncology

## 2015-08-23 ENCOUNTER — Inpatient Hospital Stay: Payer: Medicare PPO

## 2015-08-23 DIAGNOSIS — C300 Malignant neoplasm of nasal cavity: Secondary | ICD-10-CM | POA: Diagnosis not present

## 2015-08-23 DIAGNOSIS — Z510 Encounter for antineoplastic radiation therapy: Secondary | ICD-10-CM | POA: Diagnosis not present

## 2015-08-23 DIAGNOSIS — C311 Malignant neoplasm of ethmoidal sinus: Secondary | ICD-10-CM | POA: Diagnosis not present

## 2015-08-23 DIAGNOSIS — F17210 Nicotine dependence, cigarettes, uncomplicated: Secondary | ICD-10-CM | POA: Diagnosis not present

## 2015-08-23 LAB — CBC
HCT: 44.1 % (ref 35.0–47.0)
HEMOGLOBIN: 14.7 g/dL (ref 12.0–16.0)
MCH: 31.2 pg (ref 26.0–34.0)
MCHC: 33.2 g/dL (ref 32.0–36.0)
MCV: 94 fL (ref 80.0–100.0)
Platelets: 178 10*3/uL (ref 150–440)
RBC: 4.69 MIL/uL (ref 3.80–5.20)
RDW: 13.2 % (ref 11.5–14.5)
WBC: 4.1 10*3/uL (ref 3.6–11.0)

## 2015-08-25 ENCOUNTER — Ambulatory Visit
Admission: RE | Admit: 2015-08-25 | Discharge: 2015-08-25 | Disposition: A | Discharge status: Home / Self Care | Payer: Medicare PPO | Source: Ambulatory Visit | Attending: Radiation Oncology | Admitting: Radiation Oncology

## 2015-08-26 ENCOUNTER — Ambulatory Visit
Admission: RE | Admit: 2015-08-26 | Discharge: 2015-08-26 | Disposition: A | Discharge status: Home / Self Care | Payer: Medicare PPO | Source: Ambulatory Visit | Attending: Radiation Oncology | Admitting: Radiation Oncology

## 2015-08-26 DIAGNOSIS — C311 Malignant neoplasm of ethmoidal sinus: Secondary | ICD-10-CM | POA: Diagnosis not present

## 2015-08-26 DIAGNOSIS — J301 Allergic rhinitis due to pollen: Secondary | ICD-10-CM | POA: Diagnosis not present

## 2015-08-26 DIAGNOSIS — Z510 Encounter for antineoplastic radiation therapy: Secondary | ICD-10-CM | POA: Diagnosis not present

## 2015-08-26 DIAGNOSIS — F17210 Nicotine dependence, cigarettes, uncomplicated: Secondary | ICD-10-CM | POA: Diagnosis not present

## 2015-08-26 DIAGNOSIS — C300 Malignant neoplasm of nasal cavity: Secondary | ICD-10-CM | POA: Diagnosis not present

## 2015-08-27 ENCOUNTER — Ambulatory Visit
Admission: RE | Admit: 2015-08-27 | Discharge: 2015-08-27 | Disposition: A | Discharge status: Home / Self Care | Payer: Medicare PPO | Source: Ambulatory Visit | Attending: Radiation Oncology | Admitting: Radiation Oncology

## 2015-08-27 DIAGNOSIS — C300 Malignant neoplasm of nasal cavity: Secondary | ICD-10-CM | POA: Diagnosis not present

## 2015-08-27 DIAGNOSIS — Z510 Encounter for antineoplastic radiation therapy: Secondary | ICD-10-CM | POA: Diagnosis not present

## 2015-08-27 DIAGNOSIS — C311 Malignant neoplasm of ethmoidal sinus: Secondary | ICD-10-CM | POA: Diagnosis not present

## 2015-08-27 DIAGNOSIS — F17210 Nicotine dependence, cigarettes, uncomplicated: Secondary | ICD-10-CM | POA: Diagnosis not present

## 2015-08-28 ENCOUNTER — Other Ambulatory Visit: Payer: Self-pay | Admitting: *Deleted

## 2015-08-28 ENCOUNTER — Ambulatory Visit
Admission: RE | Admit: 2015-08-28 | Discharge: 2015-08-28 | Disposition: A | Discharge status: Home / Self Care | Payer: Medicare PPO | Source: Ambulatory Visit | Attending: Radiation Oncology | Admitting: Radiation Oncology

## 2015-08-28 DIAGNOSIS — Z510 Encounter for antineoplastic radiation therapy: Secondary | ICD-10-CM | POA: Diagnosis not present

## 2015-08-28 DIAGNOSIS — C300 Malignant neoplasm of nasal cavity: Secondary | ICD-10-CM | POA: Diagnosis not present

## 2015-08-28 DIAGNOSIS — J301 Allergic rhinitis due to pollen: Secondary | ICD-10-CM | POA: Diagnosis not present

## 2015-08-28 DIAGNOSIS — C311 Malignant neoplasm of ethmoidal sinus: Secondary | ICD-10-CM | POA: Diagnosis not present

## 2015-08-28 DIAGNOSIS — F17210 Nicotine dependence, cigarettes, uncomplicated: Secondary | ICD-10-CM | POA: Diagnosis not present

## 2015-08-28 MED ORDER — FLUCONAZOLE 100 MG PO TABS: 100.0000 mg | ORAL_TABLET | Freq: Every day | ORAL | Status: DC

## 2015-08-29 ENCOUNTER — Ambulatory Visit
Admission: RE | Admit: 2015-08-29 | Discharge: 2015-08-29 | Disposition: A | Discharge status: Home / Self Care | Payer: Medicare PPO | Source: Ambulatory Visit | Attending: Radiation Oncology | Admitting: Radiation Oncology

## 2015-08-29 ENCOUNTER — Other Ambulatory Visit: Payer: Self-pay | Admitting: *Deleted

## 2015-08-29 DIAGNOSIS — J301 Allergic rhinitis due to pollen: Secondary | ICD-10-CM | POA: Diagnosis not present

## 2015-08-29 DIAGNOSIS — F17210 Nicotine dependence, cigarettes, uncomplicated: Secondary | ICD-10-CM | POA: Diagnosis not present

## 2015-08-29 DIAGNOSIS — Z510 Encounter for antineoplastic radiation therapy: Secondary | ICD-10-CM | POA: Diagnosis not present

## 2015-08-29 DIAGNOSIS — R319 Hematuria, unspecified: Principal | ICD-10-CM

## 2015-08-29 DIAGNOSIS — N390 Urinary tract infection, site not specified: Secondary | ICD-10-CM

## 2015-08-29 DIAGNOSIS — C300 Malignant neoplasm of nasal cavity: Secondary | ICD-10-CM | POA: Diagnosis not present

## 2015-08-30 ENCOUNTER — Inpatient Hospital Stay: Payer: Medicare PPO | Attending: Oncology

## 2015-08-30 ENCOUNTER — Ambulatory Visit
Admission: RE | Admit: 2015-08-30 | Discharge: 2015-08-30 | Disposition: A | Discharge status: Home / Self Care | Payer: Medicare PPO | Source: Ambulatory Visit | Attending: Radiation Oncology | Admitting: Radiation Oncology

## 2015-08-30 DIAGNOSIS — C311 Malignant neoplasm of ethmoidal sinus: Secondary | ICD-10-CM | POA: Diagnosis not present

## 2015-08-30 DIAGNOSIS — N390 Urinary tract infection, site not specified: Secondary | ICD-10-CM

## 2015-08-30 DIAGNOSIS — Z510 Encounter for antineoplastic radiation therapy: Secondary | ICD-10-CM | POA: Diagnosis not present

## 2015-08-30 DIAGNOSIS — C300 Malignant neoplasm of nasal cavity: Secondary | ICD-10-CM | POA: Diagnosis not present

## 2015-08-30 DIAGNOSIS — F17210 Nicotine dependence, cigarettes, uncomplicated: Secondary | ICD-10-CM | POA: Diagnosis not present

## 2015-08-30 DIAGNOSIS — J301 Allergic rhinitis due to pollen: Secondary | ICD-10-CM | POA: Diagnosis not present

## 2015-08-30 DIAGNOSIS — R319 Hematuria, unspecified: Secondary | ICD-10-CM

## 2015-08-30 LAB — CBC
HEMATOCRIT: 44.1 % (ref 35.0–47.0)
HEMOGLOBIN: 14.7 g/dL (ref 12.0–16.0)
MCH: 31.4 pg (ref 26.0–34.0)
MCHC: 33.2 g/dL (ref 32.0–36.0)
MCV: 94.5 fL (ref 80.0–100.0)
Platelets: 171 10*3/uL (ref 150–440)
RBC: 4.66 MIL/uL (ref 3.80–5.20)
RDW: 13.4 % (ref 11.5–14.5)
WBC: 4.9 10*3/uL (ref 3.6–11.0)

## 2015-08-30 LAB — URINALYSIS COMPLETE WITH MICROSCOPIC (ARMC ONLY)
BILIRUBIN URINE: NEGATIVE
Glucose, UA: NEGATIVE mg/dL
HGB URINE DIPSTICK: NEGATIVE
Ketones, ur: NEGATIVE mg/dL
Leukocytes, UA: NEGATIVE
Nitrite: NEGATIVE
PH: 6 (ref 5.0–8.0)
Protein, ur: 30 mg/dL — AB
Specific Gravity, Urine: 1.018 (ref 1.005–1.030)

## 2015-09-01 LAB — URINE CULTURE

## 2015-09-02 ENCOUNTER — Ambulatory Visit
Admission: RE | Admit: 2015-09-02 | Discharge: 2015-09-02 | Disposition: A | Discharge status: Home / Self Care | Payer: Medicare PPO | Source: Ambulatory Visit | Attending: Radiation Oncology | Admitting: Radiation Oncology

## 2015-09-02 DIAGNOSIS — Z510 Encounter for antineoplastic radiation therapy: Secondary | ICD-10-CM | POA: Diagnosis not present

## 2015-09-02 DIAGNOSIS — C300 Malignant neoplasm of nasal cavity: Secondary | ICD-10-CM | POA: Diagnosis not present

## 2015-09-02 DIAGNOSIS — J301 Allergic rhinitis due to pollen: Secondary | ICD-10-CM | POA: Diagnosis not present

## 2015-09-02 DIAGNOSIS — F17210 Nicotine dependence, cigarettes, uncomplicated: Secondary | ICD-10-CM | POA: Diagnosis not present

## 2015-09-02 DIAGNOSIS — C311 Malignant neoplasm of ethmoidal sinus: Secondary | ICD-10-CM | POA: Diagnosis not present

## 2015-09-03 ENCOUNTER — Ambulatory Visit
Admission: RE | Admit: 2015-09-03 | Discharge: 2015-09-03 | Disposition: A | Discharge status: Home / Self Care | Payer: Medicare PPO | Source: Ambulatory Visit | Attending: Radiation Oncology | Admitting: Radiation Oncology

## 2015-09-03 DIAGNOSIS — F17210 Nicotine dependence, cigarettes, uncomplicated: Secondary | ICD-10-CM | POA: Diagnosis not present

## 2015-09-03 DIAGNOSIS — C311 Malignant neoplasm of ethmoidal sinus: Secondary | ICD-10-CM | POA: Diagnosis not present

## 2015-09-03 DIAGNOSIS — Z510 Encounter for antineoplastic radiation therapy: Secondary | ICD-10-CM | POA: Diagnosis not present

## 2015-09-03 DIAGNOSIS — C300 Malignant neoplasm of nasal cavity: Secondary | ICD-10-CM | POA: Diagnosis not present

## 2015-09-04 ENCOUNTER — Ambulatory Visit
Admission: RE | Admit: 2015-09-04 | Discharge: 2015-09-04 | Disposition: A | Discharge status: Home / Self Care | Payer: Medicare PPO | Source: Ambulatory Visit | Attending: Radiation Oncology | Admitting: Radiation Oncology

## 2015-09-04 DIAGNOSIS — Z510 Encounter for antineoplastic radiation therapy: Secondary | ICD-10-CM | POA: Diagnosis not present

## 2015-09-04 DIAGNOSIS — C300 Malignant neoplasm of nasal cavity: Secondary | ICD-10-CM | POA: Diagnosis not present

## 2015-09-04 DIAGNOSIS — C311 Malignant neoplasm of ethmoidal sinus: Secondary | ICD-10-CM | POA: Diagnosis not present

## 2015-09-04 DIAGNOSIS — F17210 Nicotine dependence, cigarettes, uncomplicated: Secondary | ICD-10-CM | POA: Diagnosis not present

## 2015-09-05 ENCOUNTER — Ambulatory Visit: Payer: Medicare PPO

## 2015-09-06 ENCOUNTER — Inpatient Hospital Stay: Payer: Medicare PPO

## 2015-09-06 ENCOUNTER — Ambulatory Visit
Admission: RE | Admit: 2015-09-06 | Discharge: 2015-09-06 | Disposition: A | Discharge status: Home / Self Care | Payer: Medicare PPO | Source: Ambulatory Visit | Attending: Radiation Oncology | Admitting: Radiation Oncology

## 2015-09-06 DIAGNOSIS — C311 Malignant neoplasm of ethmoidal sinus: Secondary | ICD-10-CM

## 2015-09-06 DIAGNOSIS — C300 Malignant neoplasm of nasal cavity: Secondary | ICD-10-CM | POA: Diagnosis not present

## 2015-09-06 DIAGNOSIS — F17210 Nicotine dependence, cigarettes, uncomplicated: Secondary | ICD-10-CM | POA: Diagnosis not present

## 2015-09-06 DIAGNOSIS — Z510 Encounter for antineoplastic radiation therapy: Secondary | ICD-10-CM | POA: Diagnosis not present

## 2015-09-06 LAB — CBC
HCT: 45.9 % (ref 35.0–47.0)
Hemoglobin: 15.3 g/dL (ref 12.0–16.0)
MCH: 31.4 pg (ref 26.0–34.0)
MCHC: 33.3 g/dL (ref 32.0–36.0)
MCV: 94.4 fL (ref 80.0–100.0)
Platelets: 166 10*3/uL (ref 150–440)
RBC: 4.86 MIL/uL (ref 3.80–5.20)
RDW: 13.1 % (ref 11.5–14.5)
WBC: 4.3 10*3/uL (ref 3.6–11.0)

## 2015-09-09 ENCOUNTER — Ambulatory Visit: Payer: Medicare PPO

## 2015-09-09 DIAGNOSIS — J301 Allergic rhinitis due to pollen: Secondary | ICD-10-CM | POA: Diagnosis not present

## 2015-09-10 ENCOUNTER — Ambulatory Visit: Payer: Medicare PPO

## 2015-09-11 ENCOUNTER — Ambulatory Visit: Payer: Medicare PPO

## 2015-09-11 DIAGNOSIS — C300 Malignant neoplasm of nasal cavity: Secondary | ICD-10-CM | POA: Diagnosis not present

## 2015-09-12 ENCOUNTER — Ambulatory Visit
Admission: RE | Admit: 2015-09-12 | Discharge: 2015-09-12 | Disposition: A | Discharge status: Home / Self Care | Payer: Medicare PPO | Source: Ambulatory Visit | Attending: Radiation Oncology | Admitting: Radiation Oncology

## 2015-09-12 ENCOUNTER — Other Ambulatory Visit: Payer: Self-pay | Admitting: *Deleted

## 2015-09-12 DIAGNOSIS — C300 Malignant neoplasm of nasal cavity: Secondary | ICD-10-CM | POA: Diagnosis not present

## 2015-09-12 DIAGNOSIS — F17210 Nicotine dependence, cigarettes, uncomplicated: Secondary | ICD-10-CM | POA: Diagnosis not present

## 2015-09-12 DIAGNOSIS — C311 Malignant neoplasm of ethmoidal sinus: Secondary | ICD-10-CM | POA: Diagnosis not present

## 2015-09-12 DIAGNOSIS — Z510 Encounter for antineoplastic radiation therapy: Secondary | ICD-10-CM | POA: Diagnosis not present

## 2015-09-12 DIAGNOSIS — J301 Allergic rhinitis due to pollen: Secondary | ICD-10-CM | POA: Diagnosis not present

## 2015-09-12 MED ORDER — DEXAMETHASONE 4 MG PO TABS: 4.0000 mg | ORAL_TABLET | Freq: Two times a day (BID) | ORAL | Status: DC

## 2015-09-12 MED ORDER — ONDANSETRON HCL 4 MG PO TABS: 4.0000 mg | ORAL_TABLET | Freq: Four times a day (QID) | ORAL | Status: DC | PRN

## 2015-09-13 ENCOUNTER — Inpatient Hospital Stay: Payer: Medicare PPO

## 2015-09-13 ENCOUNTER — Ambulatory Visit
Admission: RE | Admit: 2015-09-13 | Discharge: 2015-09-13 | Disposition: A | Discharge status: Home / Self Care | Payer: Medicare PPO | Source: Ambulatory Visit | Attending: Radiation Oncology | Admitting: Radiation Oncology

## 2015-09-13 DIAGNOSIS — C311 Malignant neoplasm of ethmoidal sinus: Secondary | ICD-10-CM

## 2015-09-13 DIAGNOSIS — F17210 Nicotine dependence, cigarettes, uncomplicated: Secondary | ICD-10-CM | POA: Diagnosis not present

## 2015-09-13 DIAGNOSIS — Z510 Encounter for antineoplastic radiation therapy: Secondary | ICD-10-CM | POA: Diagnosis not present

## 2015-09-13 DIAGNOSIS — C300 Malignant neoplasm of nasal cavity: Secondary | ICD-10-CM | POA: Diagnosis not present

## 2015-09-13 LAB — CBC
HEMATOCRIT: 46.6 % (ref 35.0–47.0)
HEMOGLOBIN: 15.5 g/dL (ref 12.0–16.0)
MCH: 31.4 pg (ref 26.0–34.0)
MCHC: 33.3 g/dL (ref 32.0–36.0)
MCV: 94.1 fL (ref 80.0–100.0)
Platelets: 165 10*3/uL (ref 150–440)
RBC: 4.95 MIL/uL (ref 3.80–5.20)
RDW: 13.1 % (ref 11.5–14.5)
WBC: 5 10*3/uL (ref 3.6–11.0)

## 2015-09-14 ENCOUNTER — Emergency Department
Admission: EM | Admit: 2015-09-14 | Discharge: 2015-09-14 | Disposition: A | Discharge status: Home / Self Care | Payer: Medicare PPO | Attending: Emergency Medicine | Admitting: Emergency Medicine

## 2015-09-14 ENCOUNTER — Emergency Department: Payer: Medicare PPO

## 2015-09-14 ENCOUNTER — Encounter: Payer: Self-pay | Admitting: Emergency Medicine

## 2015-09-14 DIAGNOSIS — I10 Essential (primary) hypertension: Secondary | ICD-10-CM | POA: Insufficient documentation

## 2015-09-14 DIAGNOSIS — Y998 Other external cause status: Secondary | ICD-10-CM | POA: Diagnosis not present

## 2015-09-14 DIAGNOSIS — Z91040 Latex allergy status: Secondary | ICD-10-CM | POA: Insufficient documentation

## 2015-09-14 DIAGNOSIS — Z880 Allergy status to penicillin: Secondary | ICD-10-CM | POA: Diagnosis not present

## 2015-09-14 DIAGNOSIS — W01198A Fall on same level from slipping, tripping and stumbling with subsequent striking against other object, initial encounter: Secondary | ICD-10-CM | POA: Insufficient documentation

## 2015-09-14 DIAGNOSIS — Z79899 Other long term (current) drug therapy: Secondary | ICD-10-CM | POA: Diagnosis not present

## 2015-09-14 DIAGNOSIS — S52134A Nondisplaced fracture of neck of right radius, initial encounter for closed fracture: Secondary | ICD-10-CM | POA: Insufficient documentation

## 2015-09-14 DIAGNOSIS — Y9289 Other specified places as the place of occurrence of the external cause: Secondary | ICD-10-CM | POA: Insufficient documentation

## 2015-09-14 DIAGNOSIS — F17210 Nicotine dependence, cigarettes, uncomplicated: Secondary | ICD-10-CM | POA: Diagnosis not present

## 2015-09-14 DIAGNOSIS — Y9389 Activity, other specified: Secondary | ICD-10-CM | POA: Diagnosis not present

## 2015-09-14 DIAGNOSIS — S59901A Unspecified injury of right elbow, initial encounter: Secondary | ICD-10-CM | POA: Diagnosis present

## 2015-09-14 DIAGNOSIS — M25421 Effusion, right elbow: Secondary | ICD-10-CM | POA: Diagnosis not present

## 2015-09-14 DIAGNOSIS — S52131A Displaced fracture of neck of right radius, initial encounter for closed fracture: Secondary | ICD-10-CM

## 2015-09-14 NOTE — ED Provider Notes (Signed)
CSN: [id number]     Arrival date & time 09/14/15  1549 History   First MD Initiated Contact with Patient 09/14/15 1628     Chief Complaint  Patient presents with  . Fall  . Elbow Pain     (Consider location/radiation/quality/duration/timing/severity/associated sxs/prior Treatment) HPI  59 year old female presents to emergency department for evaluation of right elbow pain. Just prior to arrival she tripped, landed on her right elbow in the parking lot. She developed 7 out of 10 pain to the elbow. Range of motion is slightly decreased. She denies any swelling. No shoulder pain. No head trauma, LOC, nausea, vomiting. Patient did self medicate with leftover Norco prescription from recent surgery several months ago. She denies any numbness or tingling throughout the right upper extremity.  Past Medical History  Diagnosis Date  . Asthma   . Diverticulosis   . Headaches, cluster   . Colon polyps   . IBS (irritable bowel syndrome)   . Allergic rhinitis   . Arthritis   . Cervical spondylosis   . Glaucoma   . Kidney stones   . Hyperlipidemia   . Hypertension   . Tobacco abuse   . Depression   . GERD (gastroesophageal reflux disease)   . Cancer ([place])     melanoma  . Neuropathy ([place])   . PONV (postoperative nausea and vomiting)   . Colitis   . Cancer of nasal cavity and sinus ([place]) 07/07/2015   Past Surgical History  Procedure Laterality Date  . Appendectomy  1998  . Abdominal hysterectomy  1998  . Lumbar spine surgery  07/2004  . Vaginal delivery      x2  . Arm fx repair  2001  . Cervical fusion  2010  . Cholecystectomy  08/2011    Dr Rochel Brome  . Esophagogastroduodenoscopy  2012    Dr Tiffany Kocher  . Colonoscopy      Dr Tiffany Kocher  . Tonsillectomy  1968  . Cystoscopy      Dr. Bernardo Heater  . Back surgery    . Dilation and curettage of uterus    . Fracture surgery  2001    plate in right wrist and arm  . Image guided sinus surgery N/A 06/25/2015    Procedure: IMAGE GUIDED SINUS  SURGERY;  Surgeon: Beverly Gust, MD;  Location: [facility] ORS;  Service: ENT;  Laterality: N/A;  . Polypectomy N/A 06/25/2015    Procedure: POLYPECTOMY NASAL;  Surgeon: Beverly Gust, MD;  Location: [facility] ORS;  Service: ENT;  Laterality: N/A;   Family History  Problem Relation Age of Onset  . Hypertension Mother   . Hyperlipidemia Mother   . Diabetes Mother   . Colon cancer Father   . Pancreatic cancer Father   . Cancer Father     Colon & Pancreatic  . Breast cancer Paternal Grandmother   . Diabetes Paternal Grandmother   . Coronary artery disease Paternal Grandmother   . Heart failure Paternal Grandmother   . Cancer Paternal Grandmother     breast  . Stroke Brother    Social History  Substance Use Topics  . Smoking status: Current Every Day Smoker -- 0.50 packs/day for 39 years    Types: Cigarettes  . Smokeless tobacco: Never Used     Comment: e cigs currently--trying to quit  . Alcohol Use: No   OB History    No data available     Review of Systems  Constitutional: Negative for fever, chills, activity change and fatigue.  HENT: Negative  for congestion, sinus pressure and sore throat.   Eyes: Negative for visual disturbance.  Respiratory: Negative for cough, chest tightness and shortness of breath.   Cardiovascular: Negative for chest pain and leg swelling.  Gastrointestinal: Negative for nausea, vomiting, abdominal pain and diarrhea.  Genitourinary: Negative for dysuria.  Musculoskeletal: Positive for arthralgias. Negative for joint swelling, gait problem and neck pain.  Skin: Negative for rash.  Neurological: Negative for weakness, numbness and headaches.  Hematological: Negative for adenopathy.  Psychiatric/Behavioral: Negative for behavioral problems, confusion and agitation.      Allergies  Albumin (human); Amoxicillin-pot clavulanate; Gabapentin; Latex; Betadine; Sucralfate; Amoxicillin; Chantix; Erythromycin; and Sucralfate  Home Medications   Prior to  Admission medications   Medication Sig Start Date End Date Taking? Authorizing Provider  albuterol (PROVENTIL HFA;VENTOLIN HFA) 108 (90 BASE) MCG/ACT inhaler Inhale 2 puffs into the lungs every 6 (six) hours as needed. 06/24/15   Jackolyn Confer, MD  aspirin EC 81 MG tablet Take 81 mg by mouth daily.    Historical Provider, MD  aspirin-acetaminophen-caffeine (EXCEDRIN MIGRAINE) [phone] MG per tablet Take 2 tablets by mouth as needed for headache.    Historical Provider, MD  atenolol (TENORMIN) 25 MG tablet Take 1 tablet (25 mg total) by mouth daily. 11/14/13   Jackolyn Confer, MD  cetirizine (ZYRTEC) 10 MG tablet Take 10 mg by mouth daily. Allergies.    Historical Provider, MD  cyclobenzaprine (FLEXERIL) 10 MG tablet Take 1 tablet (10 mg total) by mouth 2 (two) times daily. Patient taking differently: Take 10 mg by mouth 3 (three) times daily.  07/04/14   Jackolyn Confer, MD  dexamethasone (DECADRON) 4 MG tablet Take 1 tablet (4 mg total) by mouth daily. 08/15/15   Noreene Filbert, MD  dexamethasone (DECADRON) 4 MG tablet Take 1 tablet (4 mg total) by mouth 2 (two) times daily. Through 09/27/15. Then 1 tab daily x7 days. Then 1/2 tab daily x7 days. Then 1/2 tab QOD until finished. 09/12/15   Noreene Filbert, MD  fluconazole (DIFLUCAN) 100 MG tablet Take 1 tablet (100 mg total) by mouth daily. 08/28/15   Noreene Filbert, MD  HYDROcodone-acetaminophen (NORCO/VICODIN) 5-325 MG per tablet Take 1 tablet by mouth every 6 (six) hours as needed for moderate pain. 06/25/15   Beverly Gust, MD  loperamide (IMODIUM A-D) 2 MG tablet Take 2 mg by mouth 4 (four) times daily as needed for diarrhea or loose stools.    Historical Provider, MD  ondansetron (ZOFRAN) 4 MG tablet Take 1 tablet (4 mg total) by mouth every 6 (six) hours as needed for nausea or vomiting. 09/12/15   Noreene Filbert, MD  pantoprazole (PROTONIX) 40 MG tablet Take 1 tablet (40 mg total) by mouth 2 (two) times daily. 11/14/13   Jackolyn Confer,  MD  venlafaxine (EFFEXOR) 37.5 MG tablet Take 1 tablet (37.5 mg total) by mouth daily. 08/07/15   Noreene Filbert, MD   BP 116/74 mmHg  Pulse 73  Temp(Src) 98.3 F (36.8 C) (Oral)  Resp 18  Ht 5\' 6"  (1.676 m)  Wt 150 lb (68.04 kg)  BMI 24.22 kg/m2  SpO2 99% Physical Exam  Constitutional: She is oriented to person, place, and time. She appears well-developed and well-nourished. No distress.  HENT:  Head: Normocephalic and atraumatic.  Mouth/Throat: Oropharynx is clear and moist.  Eyes: EOM are normal. Pupils are equal, round, and reactive to light. Right eye exhibits no discharge. Left eye exhibits no discharge.  Neck: Normal range of motion. Neck  supple.  Cardiovascular: Normal rate and intact distal pulses.   Pulmonary/Chest: Effort normal. No respiratory distress. She exhibits no tenderness.  Abdominal: Soft.  Musculoskeletal:  Examination of the right upper shoulder shows patient has full range of motion of the shoulde, wrist and digits. Patient has slightly decreased range of motion with flexion and extension of the right elbow. She is tender to palpation throughout the elbow along the medial and lateral aspects. She has no olecranon tenderness. No swelling or effusion noted. She is neurovascular intact in right upper extremity, full composite fist with grip strength 5 out of 5. Biceps and triceps are intact.   Neurological: She is alert and oriented to person, place, and time. She has normal reflexes.  Skin: Skin is warm and dry.  Psychiatric: She has a normal mood and affect. Her behavior is normal. Thought content normal.    ED Course  Procedures (including critical care time) SPLINT APPLICATION Date/Time: 0000000 PM Authorized by: Feliberto Gottron Consent: Verbal consent obtained. Risks and benefits: risks, benefits and alternatives were discussed Consent given by: patient Splint applied by:ED [facility] Location details: right posterior arm Splint type: posterior  arm Supplies used: ortho glass, ace wrap, sling Post-procedure: The splinted body part was neurovascularly unchanged following the procedure. Patient tolerance: Patient tolerated the procedure well with no immediate complications.    Labs Review Labs Reviewed - No data to display  Imaging Review Dg Elbow Complete Right  09/14/2015  CLINICAL DATA:  Right elbow pain after fall. EXAM: RIGHT ELBOW - COMPLETE 3+ VIEW COMPARISON:  None. FINDINGS: Elbow joint effusion is present. There is a fracture through the radial neck. This appears nondisplaced. No dislocation. IMPRESSION: 1. Nondisplaced radial neck fracture. 2. Joint effusion. Electronically Signed   By: Kerby Moors M.D.   On: 09/14/2015 17:39   I have personally reviewed and evaluated these images and lab results as part of my medical decision-making.   EKG Interpretation None      MDM   Final diagnoses:  Radial neck fracture, right, closed, initial encounter    59 year old female with right radial neck fracture. She is placed into a posterior splint. Given sling. She'll follow-up with orthopedics next week. She will continue with home Norco prescription. Return to the ER for any worsening symptoms urgent changes in health. Was educated on splint care.    JOSEPHINE LAFRANCE, PA-C 09/14/15 1758  Daymon Larsen, MD 09/14/15 [phone]

## 2015-09-14 NOTE — ED Notes (Signed)
[place] this [place], landed on her right elbow, with increasing pain this afternoon in her right forearm, minimal swelling noted.  Also c/o of left knee pain. Pt walked to triage with no problems. Pt undergoing radiation treated for cancer at this time.

## 2015-09-14 NOTE — Discharge Instructions (Signed)
Cast or Splint Care Casts and splints support injured limbs and keep bones from moving while they heal.  HOME CARE  Keep the cast or splint uncovered during the drying period.  A plaster cast can take 24 to 48 hours to dry.  A fiberglass cast will dry in less than 1 hour.  Do not rest the cast on anything harder than a pillow for 24 hours.  Do not put weight on your injured limb. Do not put pressure on the cast. Wait for your doctor's approval.  Keep the cast or splint dry.  Cover the cast or splint with a plastic bag during baths or wet weather.  If you have a cast over your chest and belly (trunk), take sponge baths until the cast is taken off.  If your cast gets wet, dry it with a towel or blow dryer. Use the cool setting on the blow dryer.  Keep your cast or splint clean. Wash a dirty cast with a damp cloth.  Do not put any objects under your cast or splint.  Do not scratch the skin under the cast with an object. If itching is a problem, use a blow dryer on a cool setting over the itchy area.  Do not trim or cut your cast.  Do not take out the padding from inside your cast.  Exercise your joints near the cast as told by your doctor.  Raise (elevate) your injured limb on 1 or 2 pillows for the first 1 to 3 days. GET HELP IF:  Your cast or splint cracks.  Your cast or splint is too tight or too loose.  You itch badly under the cast.  Your cast gets wet or has a soft spot.  You have a bad smell coming from the cast.  You get an object stuck under the cast.  Your skin around the cast becomes red or sore.  You have new or more pain after the cast is put on. GET HELP RIGHT AWAY IF:  You have fluid leaking through the cast.  You cannot move your fingers or toes.  Your fingers or toes turn blue or white or are cool, painful, or puffy (swollen).  You have tingling or lose feeling (numbness) around the injured area.  You have bad pain or pressure under the  cast.  You have trouble breathing or have shortness of breath.  You have chest pain.   This information is not intended to replace advice given to you by your health care provider. Make sure you discuss any questions you have with your health care provider.   Document Released: 02/11/2011 Document Revised: 06/14/2013 Document Reviewed: 04/20/2013 Elsevier Interactive Patient Education 2016 [employer]  Radial Fracture A radial fracture is a break in the radius bone, which is the long bone of the forearm that is on the same side as your thumb. Your forearm is the part of your arm that is between your elbow and your wrist. It is made up of two bones: the radius and the ulna. Most radial fractures occur near the wrist (distal radialfracture) or near the elbow (radial head fracture). A distal radial fracture is the most common type of broken arm. This fracture usually occurs about an inch above the wrist. Fractures of the middle part of the bone are less common. CAUSES  Falling with your arm outstretched is the most common cause of a radial fracture. Other causes include:  Car accidents.  Bike accidents.  A direct blow to  the middle part of the radius. RISK FACTORS  You may be at greater risk for a distal radial fracture if you are 25 years of age or older.  You may be at greater risk for a radial head fracture if you are:  Female.  42-65 years old.  You may be at a greater risk for all types of radial fractures if you have a condition that causes your bones to be weak or thin (osteoporosis). SIGNS AND SYMPTOMS A radial fracture causes pain immediately after the injury. Other signs and symptoms include:  An abnormal bend or bump in your arm (deformity).  Swelling.  Bruising.  Numbness or tingling.  Tenderness.  Limited movement. DIAGNOSIS  Your health care provider may diagnose a radial fracture based on:  Your symptoms.  Your medical history, including any recent  injury.  A physical exam. Your health care provider will look for any deformity and feel for tenderness over the break. Your health care provider will also check whether the bone is out of place.  An X-ray exam to confirm the diagnosis and learn more about the type of fracture. TREATMENT The goals of treatment are to get the bone in proper position for healing and to keep it from moving so it will heal over time. Your treatment will depend on many factors, especially the type of fracture that you have.  If the fractured bone:  Is in the correct position (nondisplaced), you may only need to wear a cast or a splint.  Has a slightly displaced fracture, you may need to have the bones moved back into place manually (closed reduction) before the splint or cast is put on.  You may have a temporary splint before you have a plaster cast. The splint allows room for some swelling. After a few days, a cast can replace the splint.  You may have to wear the cast for about 6 weeks or as directed by your health care provider.  The cast may be changed after about 3 weeks or as directed by your health care provider.  After your cast is taken off, you may need physical therapy to regain full movement in your wrist or elbow.  You may need emergency surgery if you have:  A fractured bone that is out of position (displaced).  A fracture with multiple fragments (comminuted fracture).  A fracture that breaks the skin (open fracture). This type of fracture may require surgical wires, plates, or screws to hold the bone in place.  You may have X-rays every couple of weeks to check on your healing. HOME CARE INSTRUCTIONS  Keep the injured arm above the level of your heart while you are sitting or lying down. This helps to reduce swelling and pain.  Apply ice to the injured area:  Put ice in a plastic bag.  Place a towel between your skin and the bag.  Leave the ice on for 20 minutes, 2-3 times per  day.  Move your fingers often to avoid stiffness and to minimize swelling.  If you have a plaster or fiberglass cast:  Do not try to scratch the skin under the cast using sharp or pointed objects.  Check the skin around the cast every day. You may put lotion on any red or sore areas.  Keep your cast dry and clean.  If you have a plaster splint:  Wear the splint as directed.  Loosen the elastic around the splint if your fingers become numb and tingle, or if  they turn cold and blue.  Do not put pressure on any part of your cast until it is fully hardened. Rest your cast only on a pillow for the first 24 hours.  Protect your cast or splint while bathing or showering, as directed by your health care provider. Do not put your cast or splint into water.  Take medicines only as directed by your health care provider.  Return to activities, such as sports, as directed by your health care provider. Ask your health care provider what activities are safe for you.  Keep all follow-up visits as directed by your health care provider. This is important. SEEK MEDICAL CARE IF:  Your pain medicine is not helping.  Your cast gets damaged or it breaks.  Your cast becomes loose.  Your cast gets wet.  You have more severe pain or swelling than you did before the cast.  You have severe pain when stretching your fingers.  You continue to have pain or stiffness in your elbow or your wrist after your cast is taken off. SEEK IMMEDIATE MEDICAL CARE IF:  You cannot move your fingers.  You lose feeling in your fingers or your hand.  Your hand or your fingers turn cold and pale or blue.  You notice a bad smell coming from your cast.  You have drainage from underneath your cast.  You have new stains from blood or drainage seeping through your cast.   This information is not intended to replace advice given to you by your health care provider. Make sure you discuss any questions you have with  your health care provider.   Document Released: 03/25/2006 Document Revised: 11/02/2014 Document Reviewed: 04/06/2014 Elsevier Interactive Patient Education [employer].

## 2015-09-16 ENCOUNTER — Ambulatory Visit
Admission: RE | Admit: 2015-09-16 | Discharge: 2015-09-16 | Disposition: A | Discharge status: Home / Self Care | Payer: Medicare PPO | Source: Ambulatory Visit | Attending: Radiation Oncology | Admitting: Radiation Oncology

## 2015-09-16 DIAGNOSIS — S52134A Nondisplaced fracture of neck of right radius, initial encounter for closed fracture: Secondary | ICD-10-CM | POA: Diagnosis not present

## 2015-09-16 DIAGNOSIS — Z510 Encounter for antineoplastic radiation therapy: Secondary | ICD-10-CM | POA: Diagnosis not present

## 2015-09-16 DIAGNOSIS — J301 Allergic rhinitis due to pollen: Secondary | ICD-10-CM | POA: Diagnosis not present

## 2015-09-16 DIAGNOSIS — F17210 Nicotine dependence, cigarettes, uncomplicated: Secondary | ICD-10-CM | POA: Diagnosis not present

## 2015-09-16 DIAGNOSIS — C311 Malignant neoplasm of ethmoidal sinus: Secondary | ICD-10-CM | POA: Diagnosis not present

## 2015-09-16 DIAGNOSIS — C300 Malignant neoplasm of nasal cavity: Secondary | ICD-10-CM | POA: Diagnosis not present

## 2015-09-17 ENCOUNTER — Ambulatory Visit
Admission: RE | Admit: 2015-09-17 | Discharge: 2015-09-17 | Disposition: A | Discharge status: Home / Self Care | Payer: Medicare PPO | Source: Ambulatory Visit | Attending: Radiation Oncology | Admitting: Radiation Oncology

## 2015-09-17 DIAGNOSIS — F17210 Nicotine dependence, cigarettes, uncomplicated: Secondary | ICD-10-CM | POA: Diagnosis not present

## 2015-09-17 DIAGNOSIS — Z510 Encounter for antineoplastic radiation therapy: Secondary | ICD-10-CM | POA: Diagnosis not present

## 2015-09-17 DIAGNOSIS — C311 Malignant neoplasm of ethmoidal sinus: Secondary | ICD-10-CM | POA: Diagnosis not present

## 2015-09-17 DIAGNOSIS — C300 Malignant neoplasm of nasal cavity: Secondary | ICD-10-CM | POA: Diagnosis not present

## 2015-09-18 ENCOUNTER — Ambulatory Visit
Admission: RE | Admit: 2015-09-18 | Discharge: 2015-09-18 | Disposition: A | Discharge status: Home / Self Care | Payer: Medicare PPO | Source: Ambulatory Visit | Attending: Radiation Oncology | Admitting: Radiation Oncology

## 2015-09-18 ENCOUNTER — Ambulatory Visit: Payer: Medicare PPO

## 2015-09-18 DIAGNOSIS — F17210 Nicotine dependence, cigarettes, uncomplicated: Secondary | ICD-10-CM | POA: Diagnosis not present

## 2015-09-18 DIAGNOSIS — Z510 Encounter for antineoplastic radiation therapy: Secondary | ICD-10-CM | POA: Diagnosis not present

## 2015-09-18 DIAGNOSIS — C311 Malignant neoplasm of ethmoidal sinus: Secondary | ICD-10-CM | POA: Diagnosis not present

## 2015-09-18 DIAGNOSIS — C300 Malignant neoplasm of nasal cavity: Secondary | ICD-10-CM | POA: Diagnosis not present

## 2015-09-23 ENCOUNTER — Ambulatory Visit: Payer: Medicare PPO

## 2015-09-23 ENCOUNTER — Ambulatory Visit
Admission: RE | Admit: 2015-09-23 | Discharge: 2015-09-23 | Disposition: A | Discharge status: Home / Self Care | Payer: Medicare PPO | Source: Ambulatory Visit | Attending: Radiation Oncology | Admitting: Radiation Oncology

## 2015-09-23 ENCOUNTER — Other Ambulatory Visit: Payer: Self-pay | Admitting: *Deleted

## 2015-09-23 DIAGNOSIS — Z510 Encounter for antineoplastic radiation therapy: Secondary | ICD-10-CM | POA: Diagnosis not present

## 2015-09-23 DIAGNOSIS — C311 Malignant neoplasm of ethmoidal sinus: Secondary | ICD-10-CM | POA: Diagnosis not present

## 2015-09-23 DIAGNOSIS — F17210 Nicotine dependence, cigarettes, uncomplicated: Secondary | ICD-10-CM | POA: Diagnosis not present

## 2015-09-23 DIAGNOSIS — C300 Malignant neoplasm of nasal cavity: Secondary | ICD-10-CM | POA: Diagnosis not present

## 2015-09-23 MED ORDER — FLUCONAZOLE 100 MG PO TABS: 100.0000 mg | ORAL_TABLET | Freq: Every day | ORAL | Status: DC

## 2015-09-24 ENCOUNTER — Ambulatory Visit
Admission: RE | Admit: 2015-09-24 | Discharge: 2015-09-24 | Disposition: A | Discharge status: Home / Self Care | Payer: Medicare PPO | Source: Ambulatory Visit | Attending: Radiation Oncology | Admitting: Radiation Oncology

## 2015-09-24 DIAGNOSIS — F17210 Nicotine dependence, cigarettes, uncomplicated: Secondary | ICD-10-CM | POA: Diagnosis not present

## 2015-09-24 DIAGNOSIS — C311 Malignant neoplasm of ethmoidal sinus: Secondary | ICD-10-CM | POA: Diagnosis not present

## 2015-09-24 DIAGNOSIS — C300 Malignant neoplasm of nasal cavity: Secondary | ICD-10-CM | POA: Diagnosis not present

## 2015-09-24 DIAGNOSIS — Z510 Encounter for antineoplastic radiation therapy: Secondary | ICD-10-CM | POA: Diagnosis not present

## 2015-09-25 ENCOUNTER — Ambulatory Visit
Admission: RE | Admit: 2015-09-25 | Discharge: 2015-09-25 | Disposition: A | Discharge status: Home / Self Care | Payer: Medicare PPO | Source: Ambulatory Visit | Attending: Radiation Oncology | Admitting: Radiation Oncology

## 2015-09-25 DIAGNOSIS — C311 Malignant neoplasm of ethmoidal sinus: Secondary | ICD-10-CM | POA: Diagnosis not present

## 2015-09-25 DIAGNOSIS — F17210 Nicotine dependence, cigarettes, uncomplicated: Secondary | ICD-10-CM | POA: Diagnosis not present

## 2015-09-25 DIAGNOSIS — Z510 Encounter for antineoplastic radiation therapy: Secondary | ICD-10-CM | POA: Diagnosis not present

## 2015-09-25 DIAGNOSIS — C300 Malignant neoplasm of nasal cavity: Secondary | ICD-10-CM | POA: Diagnosis not present

## 2015-09-26 ENCOUNTER — Ambulatory Visit
Admission: RE | Admit: 2015-09-26 | Discharge: 2015-09-26 | Disposition: A | Discharge status: Home / Self Care | Payer: Medicare PPO | Source: Ambulatory Visit | Attending: Radiation Oncology | Admitting: Radiation Oncology

## 2015-09-26 DIAGNOSIS — F17210 Nicotine dependence, cigarettes, uncomplicated: Secondary | ICD-10-CM | POA: Diagnosis not present

## 2015-09-26 DIAGNOSIS — Z510 Encounter for antineoplastic radiation therapy: Secondary | ICD-10-CM | POA: Diagnosis not present

## 2015-09-26 DIAGNOSIS — C300 Malignant neoplasm of nasal cavity: Secondary | ICD-10-CM | POA: Diagnosis not present

## 2015-09-26 DIAGNOSIS — J301 Allergic rhinitis due to pollen: Secondary | ICD-10-CM | POA: Diagnosis not present

## 2015-09-26 DIAGNOSIS — C311 Malignant neoplasm of ethmoidal sinus: Secondary | ICD-10-CM | POA: Diagnosis not present

## 2015-09-27 ENCOUNTER — Ambulatory Visit
Admission: RE | Admit: 2015-09-27 | Discharge: 2015-09-27 | Disposition: A | Discharge status: Home / Self Care | Payer: Medicare PPO | Source: Ambulatory Visit | Attending: Radiation Oncology | Admitting: Radiation Oncology

## 2015-09-27 DIAGNOSIS — C300 Malignant neoplasm of nasal cavity: Secondary | ICD-10-CM | POA: Diagnosis not present

## 2015-09-27 DIAGNOSIS — C311 Malignant neoplasm of ethmoidal sinus: Secondary | ICD-10-CM | POA: Diagnosis not present

## 2015-09-27 DIAGNOSIS — Z510 Encounter for antineoplastic radiation therapy: Secondary | ICD-10-CM | POA: Diagnosis not present

## 2015-09-27 DIAGNOSIS — F17210 Nicotine dependence, cigarettes, uncomplicated: Secondary | ICD-10-CM | POA: Diagnosis not present

## 2015-10-01 DIAGNOSIS — S52134D Nondisplaced fracture of neck of right radius, subsequent encounter for closed fracture with routine healing: Secondary | ICD-10-CM | POA: Diagnosis not present

## 2015-10-03 DIAGNOSIS — J301 Allergic rhinitis due to pollen: Secondary | ICD-10-CM | POA: Diagnosis not present

## 2015-10-14 ENCOUNTER — Telehealth: Payer: Self-pay | Admitting: *Deleted

## 2015-10-14 ENCOUNTER — Inpatient Hospital Stay: Payer: Medicare PPO | Admitting: Oncology

## 2015-10-14 ENCOUNTER — Inpatient Hospital Stay: Payer: Medicare PPO

## 2015-10-14 NOTE — Telephone Encounter (Signed)
Left voicemail for patient notifyng them that it is time to schedule annual low dose lung cancer screening CT scan. Instructed patient to call back to verify information prior to the scan being scheduled.  

## 2015-10-14 NOTE — Telephone Encounter (Signed)
Notified patient that annual lung cancer screening low dose CT scan is due. Confirmed that patient is within the age range of 55-77, and asymptomatic, (no signs or symptoms of lung cancer). The patient is a current smoker, with a 42.5 pack year history. The shared decision making visit was done 07/27/14 Patient is agreeable for CT scan being scheduled.

## 2015-10-17 ENCOUNTER — Inpatient Hospital Stay: Payer: Medicare PPO | Attending: Oncology

## 2015-10-17 ENCOUNTER — Inpatient Hospital Stay (HOSPITAL_BASED_OUTPATIENT_CLINIC_OR_DEPARTMENT_OTHER): Payer: Medicare PPO | Admitting: Oncology

## 2015-10-17 ENCOUNTER — Encounter: Payer: Self-pay | Admitting: Oncology

## 2015-10-17 VITALS — BP 138/73 | HR 89 | Temp 96.4°F | Resp 18 | Wt 148.6 lb

## 2015-10-17 DIAGNOSIS — Z86010 Personal history of colonic polyps: Secondary | ICD-10-CM

## 2015-10-17 DIAGNOSIS — K589 Irritable bowel syndrome without diarrhea: Secondary | ICD-10-CM | POA: Insufficient documentation

## 2015-10-17 DIAGNOSIS — Z8669 Personal history of other diseases of the nervous system and sense organs: Secondary | ICD-10-CM

## 2015-10-17 DIAGNOSIS — J45909 Unspecified asthma, uncomplicated: Secondary | ICD-10-CM | POA: Insufficient documentation

## 2015-10-17 DIAGNOSIS — F17210 Nicotine dependence, cigarettes, uncomplicated: Secondary | ICD-10-CM | POA: Insufficient documentation

## 2015-10-17 DIAGNOSIS — K5790 Diverticulosis of intestine, part unspecified, without perforation or abscess without bleeding: Secondary | ICD-10-CM | POA: Insufficient documentation

## 2015-10-17 DIAGNOSIS — Z79899 Other long term (current) drug therapy: Secondary | ICD-10-CM

## 2015-10-17 DIAGNOSIS — Z7982 Long term (current) use of aspirin: Secondary | ICD-10-CM

## 2015-10-17 DIAGNOSIS — M479 Spondylosis, unspecified: Secondary | ICD-10-CM | POA: Diagnosis not present

## 2015-10-17 DIAGNOSIS — E785 Hyperlipidemia, unspecified: Secondary | ICD-10-CM | POA: Diagnosis not present

## 2015-10-17 DIAGNOSIS — Z85828 Personal history of other malignant neoplasm of skin: Secondary | ICD-10-CM | POA: Insufficient documentation

## 2015-10-17 DIAGNOSIS — I10 Essential (primary) hypertension: Secondary | ICD-10-CM

## 2015-10-17 DIAGNOSIS — F329 Major depressive disorder, single episode, unspecified: Secondary | ICD-10-CM

## 2015-10-17 DIAGNOSIS — Z803 Family history of malignant neoplasm of breast: Secondary | ICD-10-CM | POA: Insufficient documentation

## 2015-10-17 DIAGNOSIS — Z8522 Personal history of malignant neoplasm of nasal cavities, middle ear, and accessory sinuses: Secondary | ICD-10-CM | POA: Diagnosis not present

## 2015-10-17 DIAGNOSIS — H109 Unspecified conjunctivitis: Secondary | ICD-10-CM | POA: Insufficient documentation

## 2015-10-17 DIAGNOSIS — R682 Dry mouth, unspecified: Secondary | ICD-10-CM | POA: Diagnosis not present

## 2015-10-17 DIAGNOSIS — Z801 Family history of malignant neoplasm of trachea, bronchus and lung: Secondary | ICD-10-CM | POA: Insufficient documentation

## 2015-10-17 DIAGNOSIS — C319 Malignant neoplasm of accessory sinus, unspecified: Principal | ICD-10-CM

## 2015-10-17 DIAGNOSIS — K219 Gastro-esophageal reflux disease without esophagitis: Secondary | ICD-10-CM | POA: Diagnosis not present

## 2015-10-17 DIAGNOSIS — Z923 Personal history of irradiation: Secondary | ICD-10-CM | POA: Insufficient documentation

## 2015-10-17 DIAGNOSIS — C300 Malignant neoplasm of nasal cavity: Secondary | ICD-10-CM

## 2015-10-17 DIAGNOSIS — R918 Other nonspecific abnormal finding of lung field: Secondary | ICD-10-CM

## 2015-10-17 DIAGNOSIS — Z87442 Personal history of urinary calculi: Secondary | ICD-10-CM

## 2015-10-17 DIAGNOSIS — M129 Arthropathy, unspecified: Secondary | ICD-10-CM

## 2015-10-17 LAB — COMPREHENSIVE METABOLIC PANEL
ALK PHOS: 87 U/L (ref 38–126)
ALT: 21 U/L (ref 14–54)
AST: 19 U/L (ref 15–41)
Albumin: 3.6 g/dL (ref 3.5–5.0)
Anion gap: 7 (ref 5–15)
BILIRUBIN TOTAL: 0.9 mg/dL (ref 0.3–1.2)
BUN: 8 mg/dL (ref 6–20)
CALCIUM: 8.8 mg/dL — AB (ref 8.9–10.3)
CO2: 29 mmol/L (ref 22–32)
CREATININE: 0.79 mg/dL (ref 0.44–1.00)
Chloride: 102 mmol/L (ref 101–111)
GFR calc Af Amer: >60 mL/min (ref >60)
GLUCOSE: 89 mg/dL (ref 65–99)
POTASSIUM: 4 mmol/L (ref 3.5–5.1)
Sodium: 138 mmol/L (ref 135–145)
TOTAL PROTEIN: 6.5 g/dL (ref 6.5–8.1)

## 2015-10-17 LAB — CBC WITH DIFFERENTIAL/PLATELET
BASOS ABS: 0 10*3/uL (ref 0–0.1)
Basophils Relative: 1 %
Eosinophils Absolute: 0.1 10*3/uL (ref 0–0.7)
Eosinophils Relative: 3 %
HEMATOCRIT: 46.1 % (ref 35.0–47.0)
HEMOGLOBIN: 15.2 g/dL (ref 12.0–16.0)
LYMPHS PCT: 7 %
Lymphs Abs: 0.3 10*3/uL — ABNORMAL LOW (ref 1.0–3.6)
MCH: 31.4 pg (ref 26.0–34.0)
MCHC: 33 g/dL (ref 32.0–36.0)
MCV: 95.2 fL (ref 80.0–100.0)
MONO ABS: 0.3 10*3/uL (ref 0.2–0.9)
MONOS PCT: 8 %
NEUTROS ABS: 3.4 10*3/uL (ref 1.4–6.5)
NEUTROS PCT: 81 %
Platelets: 246 10*3/uL (ref 150–440)
RBC: 4.84 MIL/uL (ref 3.80–5.20)
RDW: 14.5 % (ref 11.5–14.5)
WBC: 4.2 10*3/uL (ref 3.6–11.0)

## 2015-10-17 MED ORDER — PILOCARPINE HCL 5 MG PO TABS: 5.0000 mg | ORAL_TABLET | Freq: Two times a day (BID) | ORAL | Status: DC

## 2015-10-17 MED ORDER — TOBRAMYCIN-DEXAMETHASONE 0.3-0.1 % OP OINT: 1.0000 "application " | TOPICAL_OINTMENT | Freq: Two times a day (BID) | OPHTHALMIC | Status: DC

## 2015-10-17 MED ORDER — TOBRAMYCIN-DEXAMETHASONE 0.3-0.1 % OP SUSP: 1.0000 [drp] | Freq: Two times a day (BID) | OPHTHALMIC | Status: DC

## 2015-10-17 NOTE — Progress Notes (Signed)
Patient states she is having problems with her eyes being "gunked up" in the morning.  Both eyes are red this morning.  Also states she has a rash on on right foot and ankle.  Some noted some rash on left side but not as much.

## 2015-10-17 NOTE — Progress Notes (Signed)
[place] @ [facility] [phone]  Fax:([phone]) [place]: 13-Dec-1955  MR#: [id number]  PNT#:[id number]  Patient Care Team: Jackolyn Confer, MD as PCP - General (Internal Medicine) Earnie Larsson, MD (Neurosurgery) Leeroy Cha, MD (Neurosurgery) Manya Silvas, MD (Gastroenterology) Jonette Mate, MD (General Surgery)  CHIEF COMPLAINT:  Chief Complaint  Patient presents with  . Cancer of nasal cavity and sinus   1.  Polypoid mass in the right nasal cavity status post resection consistent with adenocarcinoma with lymphovascular invasion T1 N0 M0 tumor (August of 2016) 2.  PET scan is positive with possibility of tumor in the right ethmoid sinus 3.  Small lung nodule in the right lower lobe and left upper lobe appears to be benign needs follow-up 4.  Patient was started radiation therapy to the involved field and lymph node as per Dr. Baruch Gouty in September of 2016 5.  Patient has finished radiation therapy  Of involved field in   decof 2016 VISIT DIAGNOSIS:  , Adenocarcinoma of the right nasal cavity  Cancer of the right nasal cavity  No history exists.    Oncology Flowsheet 05/13/2011 08/04/2013 06/25/2015  dexamethasone (DECADRON) IJ - - -  dexamethasone (DECADRON) IV - 10 mg -  diazepam (VALIUM) PO 10 mg - -  LORazepam (ATIVAN) IV - 1 mg -  metoCLOPramide (REGLAN) IV - 10 mg -  ondansetron (ZOFRAN) IM 4 mg - -  ondansetron (ZOFRAN) IV - 4 mg -    INTERVAL HISTORY: \ 59 year old lady was recently finished radiation therapy to the right nasal cavity.  Complains of redness of the right high as well as purulent discharge.  Complains of dryness of the mouth.  Patient is on tapering dose of Decadron already started Decadron 1 tablet every other day.  gradually recovering from the side effect of radiation therapy.  Recently had ENT evaluation and no evidence of recurrent disease was found.      REVIEW OF SYSTEMS:     GENERAL:  Feels good.  Active.  No fevers, sweats or weight loss. PERFORMANCE STATUS (ECOG):  0 HEENT:  Redness of the right eye and purulent discharge and dryness of the mouth as described above  Lungs: No shortness of breath or cough.  No hemoptysis. Cardiac:  No chest pain, palpitations, orthopnea, or PND. GI:  No nausea, vomiting, diarrhea, constipation, melena or hematochezia. GU:  No urgency, frequency, dysuria, or hematuria.  Recent had a hysterectomy Musculoskeletal:  No back pain.  No joint pain.  No muscle tenderness.  Chronic back pain on Flexeril Extremities:  No pain or swelling. Skin:  No rashes or skin changes. Neuro:  No headache, numbness or weakness, balance or coordination issues.  Migraine headache Endocrine:  No diabetes, thyroid issues, hot flashes or night sweats. Psych:  No mood changes, depression or anxiety. Pain:  No focal pain. Review of systems:  All other systems reviewed and found to be negative.  As per HPI. Otherwise, a complete review of systems is negatve.  PAST MEDICAL HISTORY: Past Medical History  Diagnosis Date  . Asthma   . Diverticulosis   . Headaches, cluster   . Colon polyps   . IBS (irritable bowel syndrome)   . Allergic rhinitis   . Arthritis   . Cervical spondylosis   . Glaucoma   . Kidney stones   . Hyperlipidemia   . Hypertension   . Tobacco abuse   . Depression   . GERD (  gastroesophageal reflux disease)   . Cancer ([place])     melanoma  . Neuropathy ([place])   . PONV (postoperative nausea and vomiting)   . Colitis   . Cancer of nasal cavity and sinus ([place]) 07/07/2015    PAST SURGICAL HISTORY: Past Surgical History  Procedure Laterality Date  . Appendectomy  1998  . Abdominal hysterectomy  1998  . Lumbar spine surgery  07/2004  . Vaginal delivery      x2  . Arm fx repair  2001  . Cervical fusion  2010  . Cholecystectomy  08/2011    Dr Rochel Brome  . Esophagogastroduodenoscopy  2012    Dr Tiffany Kocher  . Colonoscopy       Dr Tiffany Kocher  . Tonsillectomy  1968  . Cystoscopy      Dr. Bernardo Heater  . Back surgery    . Dilation and curettage of uterus    . Fracture surgery  2001    plate in right wrist and arm  . Image guided sinus surgery N/A 06/25/2015    Procedure: IMAGE GUIDED SINUS SURGERY;  Surgeon: Beverly Gust, MD;  Location: [facility] ORS;  Service: ENT;  Laterality: N/A;  . Polypectomy N/A 06/25/2015    Procedure: POLYPECTOMY NASAL;  Surgeon: Beverly Gust, MD;  Location: [facility] ORS;  Service: ENT;  Laterality: N/A;    FAMILY HISTORY Family History  Problem Relation Age of Onset  . Hypertension Mother   . Hyperlipidemia Mother   . Diabetes Mother   . Colon cancer Father   . Pancreatic cancer Father   . Cancer Father     Colon & Pancreatic  . Breast cancer Paternal Grandmother   . Diabetes Paternal Grandmother   . Coronary artery disease Paternal Grandmother   . Heart failure Paternal Grandmother   . Cancer Paternal Grandmother     breast  . Stroke Brother     GYNECOLOGIC HISTORY:  No LMP recorded. Patient has had a hysterectomy.     ADVANCED DIRECTIVES:  Patient does not have any living will or healthcare power of attorney.  Information was given .  Available resources had been discussed.  We will follow-up on subsequent appointments regarding this issue  HEALTH MAINTENANCE: Social History  Substance Use Topics  . Smoking status: Current Every Day Smoker -- 0.50 packs/day for 39 years    Types: Cigarettes  . Smokeless tobacco: Never Used     Comment: e cigs currently--trying to quit  . Alcohol Use: No      Allergies  Allergen Reactions  . Albumin (Human) Rash  . Amoxicillin-Pot Clavulanate Hives  . Gabapentin Shortness Of Breath  . Latex Dermatitis and Rash  . Betadine [Povidone Iodine] Dermatitis, Rash and Other (See Comments)    Skin burning Topical betadine and iodine have this reaction with patient.  IV contrast is NOT a problem.  jkl  . Sucralfate     Other reaction(s): Other  (See Comments) Abdominal Pain  . Amoxicillin     REACTION: rash  . Chantix [Varenicline Tartrate]     Heart racing  . Erythromycin     REACTION: rash  . Sucralfate Nausea And Vomiting    Heart racing Lightheaded     Current Outpatient Prescriptions  Medication Sig Dispense Refill  . albuterol (PROVENTIL HFA;VENTOLIN HFA) 108 (90 BASE) MCG/ACT inhaler Inhale 2 puffs into the lungs every 6 (six) hours as needed. 18 g 2  . aspirin EC 81 MG tablet Take 81 mg by mouth daily.    Marland Kitchen  aspirin-acetaminophen-caffeine (EXCEDRIN MIGRAINE) 250-250-65 MG per tablet Take 2 tablets by mouth as needed for headache.    Marland Kitchen atenolol (TENORMIN) 25 MG tablet Take 1 tablet (25 mg total) by mouth daily. 90 tablet 3  . cetirizine (ZYRTEC) 10 MG tablet Take 10 mg by mouth daily. Allergies.    . cyclobenzaprine (FLEXERIL) 10 MG tablet Take 1 tablet (10 mg total) by mouth 2 (two) times daily. (Patient taking differently: Take 10 mg by mouth 3 (three) times daily. ) 30 tablet 6  . dexamethasone (DECADRON) 4 MG tablet Take 1 tablet (4 mg total) by mouth daily. 35 tablet 0  . dexamethasone (DECADRON) 4 MG tablet Take 1 tablet (4 mg total) by mouth 2 (two) times daily. Through 09/27/15. Then 1 tab daily x7 days. Then 1/2 tab daily x7 days. Then 1/2 tab QOD until finished. 50 tablet 0  . fluconazole (DIFLUCAN) 100 MG tablet Take 1 tablet (100 mg total) by mouth daily. 5 tablet 0  . HYDROcodone-acetaminophen (NORCO/VICODIN) 5-325 MG per tablet Take 1 tablet by mouth every 6 (six) hours as needed for moderate pain. 30 tablet 0  . loperamide (IMODIUM A-D) 2 MG tablet Take 2 mg by mouth 4 (four) times daily as needed for diarrhea or loose stools.    . ondansetron (ZOFRAN) 4 MG tablet Take 1 tablet (4 mg total) by mouth every 6 (six) hours as needed for nausea or vomiting. 40 tablet 1  . pantoprazole (PROTONIX) 40 MG tablet Take 1 tablet (40 mg total) by mouth 2 (two) times daily. 60 tablet 11  . venlafaxine (EFFEXOR) 37.5 MG  tablet Take 1 tablet (37.5 mg total) by mouth daily. 30 tablet 3   No current facility-administered medications for this visit.    OBJECTIVE: PHYSICAL EXAM: GENERAL:  Well developed, well nourished, sitting comfortably in the exam room in no acute distress. MENTAL STATUS:  Alert and oriented to person, place and time.  EYES:   Pupils equal round and reactive to light and accomodation.  No conjunctivitis or scleral icterus. ENT:  Oropharynx clear without lesion.  Tongue normal. Mucous membranes moist.  RESPIRATORY:  Clear to auscultation without rales, wheezes or rhonchi. CARDIOVASCULAR:  Regular rate and rhythm without murmur, rub or gallop.  ABDOMEN:  Soft, non-tender, with active bowel sounds, and no hepatosplenomegaly.  No masses. BACK:  No CVA tenderness.  No tenderness on percussion of the back or rib cage. SKIN:  No rashes, ulcers or lesions. EXTREMITIES: No edema, no skin discoloration or tenderness.  No palpable cords. LYMPH NODES: No palpable cervical, supraclavicular, axillary or inguinal adenopathy  NEUROLOGICAL: Unremarkable. PSYCH:  Appropriate.  Filed Vitals:   10/17/15 1112  BP: 138/73  Pulse: 89  Temp: 96.4 F (35.8 C)  Resp: 18     Body mass index is 23.99 kg/(m^2).    ECOG FS:0 - Asymptomatic  LAB RESULTS:  Appointment on 10/17/2015  Component Date Value Ref Range Status  . WBC 10/17/2015 4.2  3.6 - 11.0 K/uL Final  . RBC 10/17/2015 4.84  3.80 - 5.20 MIL/uL Final  . Hemoglobin 10/17/2015 15.2  12.0 - 16.0 g/dL Final  . HCT 10/17/2015 46.1  35.0 - 47.0 % Final  . MCV 10/17/2015 95.2  80.0 - 100.0 fL Final  . MCH 10/17/2015 31.4  26.0 - 34.0 pg Final  . MCHC 10/17/2015 33.0  32.0 - 36.0 g/dL Final  . RDW 10/17/2015 14.5  11.5 - 14.5 % Final  . Platelets 10/17/2015 246  150 - 440  K/uL Final  . Neutrophils Relative % 10/17/2015 81   Final  . Neutro Abs 10/17/2015 3.4  1.4 - 6.5 K/uL Final  . Lymphocytes Relative 10/17/2015 7   Final  . Lymphs Abs  10/17/2015 0.3* 1.0 - 3.6 K/uL Final  . Monocytes Relative 10/17/2015 8   Final  . Monocytes Absolute 10/17/2015 0.3  0.2 - 0.9 K/uL Final  . Eosinophils Relative 10/17/2015 3   Final  . Eosinophils Absolute 10/17/2015 0.1  0 - 0.7 K/uL Final  . Basophils Relative 10/17/2015 1   Final  . Basophils Absolute 10/17/2015 0.0  0 - 0.1 K/uL Final  . Sodium 10/17/2015 138  135 - 145 mmol/L Final  . Potassium 10/17/2015 4.0  3.5 - 5.1 mmol/L Final  . Chloride 10/17/2015 102  101 - 111 mmol/L Final  . CO2 10/17/2015 29  22 - 32 mmol/L Final  . Glucose, Bld 10/17/2015 89  65 - 99 mg/dL Final  . BUN 10/17/2015 8  6 - 20 mg/dL Final  . Creatinine, Ser 10/17/2015 0.79  0.44 - 1.00 mg/dL Final  . Calcium 10/17/2015 8.8* 8.9 - 10.3 mg/dL Final  . Total Protein 10/17/2015 6.5  6.5 - 8.1 g/dL Final  . Albumin 10/17/2015 3.6  3.5 - 5.0 g/dL Final  . AST 10/17/2015 19  15 - 41 U/L Final  . ALT 10/17/2015 21  14 - 54 U/L Final  . Alkaline Phosphatase 10/17/2015 87  38 - 126 U/L Final  . Total Bilirubin 10/17/2015 0.9  0.3 - 1.2 mg/dL Final  . GFR calc non Af Amer 10/17/2015 >60  >60 mL/min Final  . GFR calc Af Amer 10/17/2015 >60  >60 mL/min Final   Comment: (NOTE) The eGFR has been calculated using the CKD EPI equation. This calculation has not been validated in all clinical situations. eGFR's persistently <60 mL/min signify possible Chronic Kidney Disease.   . Anion gap 10/17/2015 7  5 - 15 Final     STUDIES: No results found.  ASSESSMENT:  1.  Right sino  Nasal carcinoma.  Adenocarcinoma with lymphovascular invasion\ PET scan has been reviewed independently sews hypermetabolic region of the right ethmoid sinus consistent with biopsy-proven adenocarcinoma.  No evidence of local nodal metastasis disease.   post-radiation therapy. On clinical ground there is no evidence of recurrent or progressive disease Side effect of radiation with dry mouth Salagen has been prescribed Radiation  conjunctivitis TobraDex drops has been prescribed.  If redness is not better patient was advised to get ophthalmological evaluation  2.  Small pulmonary nodule needs to be followed. May need another CT scan for further evaluation by March of 2016 will be arranged  3. continues to smoke. Has been counseled regarding available resources to quit smoking Carcinoma of right nasal cavity, adenocarcinoma with lymphovascular invasion  [id number] Stage I cancer   Cancer of the right nasal cavity   Patient expressed understanding and was in agreement with this plan. She also understands that She can call clinic at any time with any questions, concerns, or complaints.    No matching staging information was found for the patient.  Forest Gleason, MD   10/17/2015 11:26 AM

## 2015-10-22 ENCOUNTER — Encounter: Payer: Self-pay | Admitting: Oncology

## 2015-10-23 ENCOUNTER — Other Ambulatory Visit: Payer: Self-pay | Admitting: Family Medicine

## 2015-10-23 DIAGNOSIS — Z87891 Personal history of nicotine dependence: Secondary | ICD-10-CM | POA: Insufficient documentation

## 2015-10-25 ENCOUNTER — Other Ambulatory Visit: Payer: Self-pay | Admitting: Family Medicine

## 2015-10-25 DIAGNOSIS — Z87891 Personal history of nicotine dependence: Secondary | ICD-10-CM

## 2015-10-29 DIAGNOSIS — S52134D Nondisplaced fracture of neck of right radius, subsequent encounter for closed fracture with routine healing: Secondary | ICD-10-CM | POA: Diagnosis not present

## 2015-10-30 DIAGNOSIS — R682 Dry mouth, unspecified: Secondary | ICD-10-CM | POA: Diagnosis not present

## 2015-10-30 DIAGNOSIS — C300 Malignant neoplasm of nasal cavity: Secondary | ICD-10-CM | POA: Diagnosis not present

## 2015-11-01 ENCOUNTER — Inpatient Hospital Stay: Admission: RE | Admit: 2015-11-01 | Payer: Medicare PPO | Source: Ambulatory Visit | Admitting: Radiation Oncology

## 2015-11-01 ENCOUNTER — Ambulatory Visit: Payer: Medicare PPO | Admitting: Radiation Oncology

## 2015-11-05 ENCOUNTER — Ambulatory Visit: Admission: RE | Admit: 2015-11-05 | Payer: Medicare PPO | Source: Ambulatory Visit

## 2015-11-05 ENCOUNTER — Inpatient Hospital Stay: Admission: RE | Admit: 2015-11-05 | Payer: Medicare PPO | Source: Ambulatory Visit

## 2015-11-08 DIAGNOSIS — J301 Allergic rhinitis due to pollen: Secondary | ICD-10-CM | POA: Diagnosis not present

## 2015-11-12 ENCOUNTER — Ambulatory Visit
Admission: RE | Admit: 2015-11-12 | Discharge: 2015-11-12 | Disposition: A | Discharge status: Home / Self Care | Payer: Medicare PPO | Source: Ambulatory Visit | Attending: Family Medicine | Admitting: Family Medicine

## 2015-11-12 ENCOUNTER — Other Ambulatory Visit: Payer: Self-pay | Admitting: Family Medicine

## 2015-11-12 DIAGNOSIS — R918 Other nonspecific abnormal finding of lung field: Secondary | ICD-10-CM | POA: Insufficient documentation

## 2015-11-12 DIAGNOSIS — Z87891 Personal history of nicotine dependence: Secondary | ICD-10-CM | POA: Insufficient documentation

## 2015-11-12 DIAGNOSIS — C310 Malignant neoplasm of maxillary sinus: Secondary | ICD-10-CM | POA: Diagnosis not present

## 2015-11-14 ENCOUNTER — Ambulatory Visit (INDEPENDENT_AMBULATORY_CARE_PROVIDER_SITE_OTHER): Payer: Medicare PPO

## 2015-11-14 VITALS — BP 128/82 | HR 83 | Temp 98.1°F | Resp 14 | Ht 66.0 in | Wt 145.8 lb

## 2015-11-14 DIAGNOSIS — Z760 Encounter for issue of repeat prescription: Secondary | ICD-10-CM

## 2015-11-14 DIAGNOSIS — Z0000 Encounter for general adult medical examination without abnormal findings: Secondary | ICD-10-CM | POA: Diagnosis not present

## 2015-11-14 MED ORDER — PANTOPRAZOLE SODIUM 40 MG PO TBEC: 40.0000 mg | DELAYED_RELEASE_TABLET | Freq: Two times a day (BID) | ORAL | Status: DC

## 2015-11-14 NOTE — Progress Notes (Signed)
Annual Wellness Visit as completed by Health Coach was reviewed in full.  

## 2015-11-14 NOTE — Patient Instructions (Addendum)
Heather Beasley,  Thank you for taking time to come for your Medicare Wellness Visit.  I appreciate your ongoing commitment to your health goals. Please review the following plan we discussed and let me know if I can assist you in the future.  Return in 2 weeks for physical with Dr. Gilford Rile  Mammogram A mammogram is an X-ray of the breasts that is done to check for abnormal changes. This procedure can screen for and detect any changes that may suggest breast cancer. A mammogram can also identify other changes and variations in the breast, such as:  Inflammation of the breast tissue (mastitis).  An infected area that contains a collection of pus (abscess).  A fluid-filled sac (cyst).  Fibrocystic changes. This is when breast tissue becomes denser, which can make the tissue feel rope-like or uneven under the skin.  Tumors that are not cancerous (benign). LET [facility] CARE PROVIDER KNOW ABOUT:  Any allergies you have.  If you have breast implants.  If you have had previous breast disease, biopsy, or surgery.  If you are breastfeeding.  Any possibility that you could be pregnant, if this applies.  If you are younger than age 65.  If you have a family history of breast cancer. RISKS AND COMPLICATIONS Generally, this is a safe procedure. However, problems may occur, including:  Exposure to radiation. Radiation levels are very low with this test.  The results being misinterpreted.  The need for further tests.  The inability of the mammogram to detect certain cancers. BEFORE THE PROCEDURE  Schedule your test about 1-2 weeks after your menstrual period. This is usually when your breasts are the least tender.  If you have had a mammogram done at a different facility in the past, get the mammogram X-rays or have them sent to your current exam facility in order to compare them.  Wash your breasts and under your arms the day of the test.  Do not wear deodorants, perfumes, lotions,  or powders anywhere on your body on the day of the test.  Remove any jewelry from your neck.  Wear clothes that you can change into and out of easily. PROCEDURE  You will undress from the waist up and put on a gown.  You will stand in front of the X-ray machine.  Each breast will be placed between two plastic or glass plates. The plates will compress your breast for a few seconds. Try to stay as relaxed as possible during the procedure. This does not cause any harm to your breasts and any discomfort you feel will be very brief.  X-rays will be taken from different angles of each breast. The procedure may vary among health care providers and hospitals. AFTER THE PROCEDURE  The mammogram will be examined by a specialist (radiologist).  You may need to repeat certain parts of the test, depending on the quality of the images. This is commonly done if the radiologist needs a better view of the breast tissue.  Ask when your test results will be ready. Make sure you get your test results.  You may resume your normal activities.   This information is not intended to replace advice given to you by your health care provider. Make sure you discuss any questions you have with your health care provider.   Document Released: 10/09/2000 Document Revised: 07/03/2015 Document Reviewed: 12/21/2014 Elsevier Interactive Patient Education 2016 [employer].  Steps to Quit Smoking  Smoking tobacco can be harmful to your health and can  affect almost every organ in your body. Smoking puts you, and those around you, at risk for developing many serious chronic diseases. Quitting smoking is difficult, but it is one of the best things that you can do for your health. It is never too late to quit. WHAT ARE THE BENEFITS OF QUITTING SMOKING? When you quit smoking, you lower your risk of developing serious diseases and conditions, such as:  Lung cancer or lung disease, such as COPD.  Heart  disease.  Stroke.  Heart attack.  Infertility.  Osteoporosis and bone fractures. Additionally, symptoms such as coughing, wheezing, and shortness of breath may get better when you quit. You may also find that you get sick less often because your body is stronger at fighting off colds and infections. If you are pregnant, quitting smoking can help to reduce your chances of having a baby of low birth weight. HOW DO I GET READY TO QUIT? When you decide to quit smoking, create a plan to make sure that you are successful. Before you quit:  Pick a date to quit. Set a date within the next two weeks to give you time to prepare.  Write down the reasons why you are quitting. Keep this list in places where you will see it often, such as on your bathroom mirror or in your car or wallet.  Identify the people, places, things, and activities that make you want to smoke (triggers) and avoid them. Make sure to take these actions:  Throw away all cigarettes at home, at work, and in your car.  Throw away smoking accessories, such as Scientist, research (medical).  Clean your car and make sure to empty the ashtray.  Clean your home, including curtains and carpets.  Tell your family, friends, and coworkers that you are quitting. Support from your loved ones can make quitting easier.  Talk with your health care provider about your options for quitting smoking.  Find out what treatment options are covered by your health insurance. WHAT STRATEGIES CAN I USE TO QUIT SMOKING?  Talk with your healthcare provider about different strategies to quit smoking. Some strategies include:  Quitting smoking altogether instead of gradually lessening how much you smoke over a period of time. Research shows that quitting "cold [place]" is more successful than gradually quitting.  Attending in-person counseling to help you build problem-solving skills. You are more likely to have success in quitting if you attend several  counseling sessions. Even short sessions of 10 minutes can be effective.  Finding resources and support systems that can help you to quit smoking and remain smoke-free after you quit. These resources are most helpful when you use them often. They can include:  Online chats with a Social worker.  Telephone quitlines.  Printed Furniture conservator/restorer.  Support groups or group counseling.  Text messaging programs.  Mobile phone applications.  Taking medicines to help you quit smoking. (If you are pregnant or breastfeeding, talk with your health care provider first.) Some medicines contain nicotine and some do not. Both types of medicines help with cravings, but the medicines that include nicotine help to relieve withdrawal symptoms. Your health care provider may recommend:  Nicotine patches, gum, or lozenges.  Nicotine inhalers or sprays.  Non-nicotine medicine that is taken by mouth. Talk with your health care provider about combining strategies, such as taking medicines while you are also receiving in-person counseling. Using these two strategies together makes you more likely to succeed in quitting than if you used either strategy on  its own. If you are pregnant or breastfeeding, talk with your health care provider about finding counseling or other support strategies to quit smoking. Do not take medicine to help you quit smoking unless told to do so by your health care provider. WHAT THINGS CAN I DO TO MAKE IT EASIER TO QUIT? Quitting smoking might feel overwhelming at first, but there is a lot that you can do to make it easier. Take these important actions:  Reach out to your family and friends and ask that they support and encourage you during this time. Call telephone quitlines, reach out to support groups, or work with a counselor for support.  Ask people who smoke to avoid smoking around you.  Avoid places that trigger you to smoke, such as bars, parties, or smoke-break areas at  work.  Spend time around people who do not smoke.  Lessen stress in your life, because stress can be a smoking trigger for some people. To lessen stress, try:  Exercising regularly.  Deep-breathing exercises.  Yoga.  Meditating.  Performing a body scan. This involves closing your eyes, scanning your body from head to toe, and noticing which parts of your body are particularly tense. Purposefully relax the muscles in those areas.  Download or purchase mobile phone or tablet apps (applications) that can help you stick to your quit plan by providing reminders, tips, and encouragement. There are many free apps, such as QuitGuide from the [employer] Office manager for Disease Control and Prevention). You can find other support for quitting smoking (smoking cessation) through smokefree.gov and other websites. HOW WILL I FEEL WHEN I QUIT SMOKING? Within the first 24 hours of quitting smoking, you may start to feel some withdrawal symptoms. These symptoms are usually most noticeable 2-3 days after quitting, but they usually do not last beyond 2-3 weeks. Changes or symptoms that you might experience include:  Mood swings.  Restlessness, anxiety, or irritation.  Difficulty concentrating.  Dizziness.  Strong cravings for sugary foods in addition to nicotine.  Mild weight gain.  Constipation.  Nausea.  Coughing or a sore throat.  Changes in how your medicines work in your body.  A depressed mood.  Difficulty sleeping (insomnia). After the first 2-3 weeks of quitting, you may start to notice more positive results, such as:  Improved sense of smell and taste.  Decreased coughing and sore throat.  Slower heart rate.  Lower blood pressure.  Clearer skin.  The ability to breathe more easily.  Fewer sick days. Quitting smoking is very challenging for most people. Do not get discouraged if you are not successful the first time. Some people need to make many attempts to quit before they  achieve long-term success. Do your best to stick to your quit plan, and talk with your health care provider if you have any questions or concerns.   This information is not intended to replace advice given to you by your health care provider. Make sure you discuss any questions you have with your health care provider.   Document Released: 10/06/2001 Document Revised: 02/26/2015 Document Reviewed: 02/26/2015 Elsevier Interactive Patient Education [employer].

## 2015-11-14 NOTE — Progress Notes (Signed)
Subjective:   Heather Beasley is a 60 y.o. female who presents for Medicare Annual (Subsequent) preventive examination.  Review of Systems:  No ROS.  Medicare Wellness Visit.  Cardiac Risk Factors include: hypertension;advanced age (>41men, >48 women)     Objective:     Vitals: BP 128/82 mmHg  Pulse 83  Temp(Src) 98.1 F (36.7 C) (Oral)  Resp 14  Ht 5\' 6"  (1.676 m)  Wt 145 lb 12.8 oz (66.134 kg)  BMI 23.54 kg/m2  SpO2 98%  LMP   Tobacco History  Smoking status  . Current Every Day Smoker -- 0.50 packs/day for 39 years  . Types: Cigarettes  Smokeless tobacco  . Never Used    Comment: e cigs currently--trying to quit     Ready to quit: Not Answered Counseling given: Not Answered   Past Medical History  Diagnosis Date  . Asthma   . Diverticulosis   . Headaches, cluster   . Colon polyps   . IBS (irritable bowel syndrome)   . Allergic rhinitis   . Arthritis   . Cervical spondylosis   . Glaucoma   . Kidney stones   . Hyperlipidemia   . Hypertension   . Tobacco abuse   . Depression   . GERD (gastroesophageal reflux disease)   . Cancer ([place])     melanoma  . Neuropathy ([place])   . PONV (postoperative nausea and vomiting)   . Colitis   . Cancer of nasal cavity and sinus ([place]) 07/07/2015   Past Surgical History  Procedure Laterality Date  . Appendectomy  1998  . Abdominal hysterectomy  1998  . Lumbar spine surgery  07/2004  . Vaginal delivery      x2  . Arm fx repair  2001  . Cervical fusion  2010  . Cholecystectomy  08/2011    Dr Rochel Brome  . Esophagogastroduodenoscopy  2012    Dr Tiffany Kocher  . Colonoscopy      Dr Tiffany Kocher  . Tonsillectomy  1968  . Cystoscopy      Dr. Bernardo Heater  . Back surgery    . Dilation and curettage of uterus    . Fracture surgery  2001    plate in right wrist and arm  . Image guided sinus surgery N/A 06/25/2015    Procedure: IMAGE GUIDED SINUS SURGERY;  Surgeon: Beverly Gust, MD;  Location: [facility] ORS;  Service: ENT;   Laterality: N/A;  . Polypectomy N/A 06/25/2015    Procedure: POLYPECTOMY NASAL;  Surgeon: Beverly Gust, MD;  Location: [facility] ORS;  Service: ENT;  Laterality: N/A;   Family History  Problem Relation Age of Onset  . Hypertension Mother   . Hyperlipidemia Mother   . Diabetes Mother   . Colon cancer Father   . Pancreatic cancer Father   . Cancer Father     Colon & Pancreatic  . Breast cancer Paternal Grandmother   . Diabetes Paternal Grandmother   . Coronary artery disease Paternal Grandmother   . Heart failure Paternal Grandmother   . Cancer Paternal Grandmother     breast  . Stroke Brother    History  Sexual Activity  . Sexual Activity:  . Partners: Male    Outpatient Encounter Prescriptions as of 11/14/2015  Medication Sig  . albuterol (PROVENTIL HFA;VENTOLIN HFA) 108 (90 BASE) MCG/ACT inhaler Inhale 2 puffs into the lungs every 6 (six) hours as needed.  Marland Kitchen aspirin EC 81 MG tablet Take 81 mg by mouth daily.  . cetirizine (ZYRTEC) 10  MG tablet Take 10 mg by mouth daily. Allergies.  . cyclobenzaprine (FLEXERIL) 10 MG tablet Take 1 tablet (10 mg total) by mouth 2 (two) times daily. (Patient taking differently: Take 10 mg by mouth 3 (three) times daily. )  . loperamide (IMODIUM A-D) 2 MG tablet Take 2 mg by mouth 4 (four) times daily as needed for diarrhea or loose stools.  . ondansetron (ZOFRAN) 4 MG tablet Take 1 tablet (4 mg total) by mouth every 6 (six) hours as needed for nausea or vomiting.  . pantoprazole (PROTONIX) 40 MG tablet Take 1 tablet (40 mg total) by mouth 2 (two) times daily.  . pilocarpine (SALAGEN) 5 MG tablet Take 1 tablet (5 mg total) by mouth 2 (two) times daily.  . [DISCONTINUED] aspirin-acetaminophen-caffeine (EXCEDRIN MIGRAINE) 250-250-65 MG per tablet Take 2 tablets by mouth as needed for headache.  . [DISCONTINUED] atenolol (TENORMIN) 25 MG tablet Take 1 tablet (25 mg total) by mouth daily.  . [DISCONTINUED] dexamethasone (DECADRON) 4 MG tablet   .  [DISCONTINUED] fluconazole (DIFLUCAN) 100 MG tablet Take 1 tablet (100 mg total) by mouth daily.  . [DISCONTINUED] HYDROcodone-acetaminophen (NORCO/VICODIN) 5-325 MG per tablet Take 1 tablet by mouth every 6 (six) hours as needed for moderate pain.  . [DISCONTINUED] pantoprazole (PROTONIX) 40 MG tablet Take 1 tablet (40 mg total) by mouth 2 (two) times daily.  . [DISCONTINUED] tobramycin-dexamethasone (TOBRADEX) ophthalmic solution Place 1 drop into both eyes 2 (two) times daily. For 10 days  . [DISCONTINUED] venlafaxine (EFFEXOR) 37.5 MG tablet Take 1 tablet (37.5 mg total) by mouth daily.   No facility-administered encounter medications on file as of 11/14/2015.    Activities of Daily Living In your present state of health, do you have any difficulty performing the following activities: 11/14/2015 06/24/2015  Hearing? N N  Vision? N N  Difficulty concentrating or making decisions? N N  Walking or climbing stairs? Y Y  Dressing or bathing? N N  Doing errands, shopping? N -  Preparing Food and eating ? N -  Using the Toilet? N -  In the past six months, have you accidently leaked urine? Y -  Do you have problems with loss of bowel control? Y -  Managing your Medications? N -  Managing your Finances? N -  Housekeeping or managing your Housekeeping? N -    Patient Care Team: Jackolyn Confer, MD as PCP - General (Internal Medicine) Earnie Larsson, MD (Neurosurgery) Leeroy Cha, MD (Neurosurgery) Manya Silvas, MD (Gastroenterology) Jonette Mate, MD (General Surgery)    Assessment:    This is a routine wellness examination for Heather Beasley. The goal of the wellness visit is to assist the patient how to close the gaps in care and create a preventative care plan for the patient.   Osteoporosis risk reviewed.   Medications reviewed; taking without issues or barriers.   Safety issues reviewed; smoke detectors in the home. No firearms in the home. Wears seatbelts when driving or  riding with others. No violence in the home.   No identified risk were noted; The patient was oriented x 3; appropriate in dress and manner and no objective failures at ADL's or IADL's.   Patient Concerns:  Increased pain in right arm below the elbow; Hx of break, XRAY completed 2 weeks ago.  Dx of CA 5 months ago. Deferred to PCP for follow up.       Exercise Activities and Dietary recommendations Current Exercise Habits:: The patient does not participate in  regular exercise at present  Goals    . Healthy Lifestyle     Stay hydrated, drink plenty of water Make healthy choices when eating Start the [place] with the [facility]      Fall Risk Fall Risk  11/14/2015 07/15/2015 07/04/2014 06/07/2013  Falls in the past year? Yes No Yes Yes  Number falls in past yr: 1 - 2 or more 2 or more  Injury with Fall? Yes - - -  Risk Factor Category  - - High Fall Risk High Fall Risk  Risk for fall due to : - - Other (Comment) Impaired balance/gait  Risk for fall due to (comments): - - caused by neuropathy left foot drop, followed by neurology  Follow up Education provided;Falls prevention discussed - - -   Depression Screen PHQ 2/9 Scores 11/14/2015 07/15/2015 07/04/2014 06/07/2013  PHQ - 2 Score 1 0 0 1  PHQ- 9 Score 3 - - -     Cognitive Testing MMSE - Mini Mental State Exam 11/14/2015  Orientation to time 5  Orientation to Place 5  Registration 3  Attention/ Calculation 5  Recall 3  Language- name 2 objects 2  Language- repeat 1  Language- follow 3 step command 3  Language- read & follow direction 1  Write a sentence 1  Copy design 1  Total score 30    Immunization History  Administered Date(s) Administered  . Influenza Split 07/27/2011, 08/25/2012  . Influenza Whole 09/14/2007  . Influenza,inj,Quad PF,36+ Mos 08/10/2013, 08/01/2015  . Pneumococcal Polysaccharide-23 11/27/2011   Screening Tests Health Maintenance  Topic Date Due  . Hepatitis C Screening  10-06-1956  .  HIV Screening  06/13/1973  . INFLUENZA VACCINE  05/26/2016  . MAMMOGRAM  10/12/2016  . PAP SMEAR  07/04/2017  . TETANUS/TDAP  08/25/2021  . COLONOSCOPY  03/09/2024      Plan:    End of life planning; Advance aging; Advanced directives discussed.  No current HCPOA/Living Will.  Materials provided to help start the conversation with her family.  Follow up with PCP for completion of short forms.  Total time spent with Advanced Directives 18 minutes.     During the course of the visit the patient was educated and counseled about the following appropriate screening and preventive services:   Vaccines to include Pneumoccal, Influenza, Hepatitis B, Td, Zostavax, HCV  Electrocardiogram  Cardiovascular Disease  Colorectal cancer screening  Bone density screening  Diabetes screening  Glaucoma screening  Mammography/PAP  Nutrition counseling   Patient Instructions (the written plan) was given to the patient.   Varney Biles, LPN  [id number]

## 2015-11-15 ENCOUNTER — Telehealth: Payer: Self-pay | Admitting: *Deleted

## 2015-11-15 NOTE — Telephone Encounter (Signed)
Notified patient of LDCT lung cancer screening results of Lung Rads 4a finding with recommendation for 3 month follow up imaging or other evaluation. Given recommendation of thoracic disease group for 3 month followup as this finding was not there several months ago, it is likely infectious. Also notified of incidental finding noted below. Patient verbalizes understanding and in fact has PET scan scheduled prior to 3 month date, which will allow [place] to evaluate lung finding as well.   IMPRESSION: 1. Lung-RADS Category 4A, suspicious. Follow up low-dose chest CT without contrast in 3 months (please use the following order, "CT CHEST LUNG CA SCREEN LOW DOSE W/O CM") is recommended. Alternatively, PET may be considered when there is a solid component 62mm or larger. A new branching opacity within the right lower lobe is indeterminate but could represent mucous filled bronchioles indicative of interval infection or aspiration. 2. Otherwise, similar appearance of scattered tiny pulmonary nodules which are primarily calcified and indicative of granulomas. 3. Atherosclerosis. These results will be called to the ordering clinician or representative by the Radiologist Assistant, and communication documented in the PACS or zVision Dashboard.

## 2015-11-22 ENCOUNTER — Other Ambulatory Visit: Payer: Self-pay | Admitting: *Deleted

## 2015-11-22 ENCOUNTER — Telehealth: Payer: Self-pay | Admitting: *Deleted

## 2015-11-22 DIAGNOSIS — C319 Malignant neoplasm of accessory sinus, unspecified: Principal | ICD-10-CM

## 2015-11-22 DIAGNOSIS — C300 Malignant neoplasm of nasal cavity: Secondary | ICD-10-CM

## 2015-11-22 MED ORDER — FLUCONAZOLE 100 MG PO TABS: 100.0000 mg | ORAL_TABLET | Freq: Every day | ORAL | Status: DC

## 2015-11-22 MED ORDER — PILOCARPINE HCL 5 MG PO TABS: 10.0000 mg | ORAL_TABLET | Freq: Two times a day (BID) | ORAL | Status: DC

## 2015-11-22 NOTE — Telephone Encounter (Signed)
Pt request incease dose on salagen. Per Magda Paganini, NP may increase to 10mg  BID. Pt verbalized understanding.

## 2015-11-29 ENCOUNTER — Ambulatory Visit (INDEPENDENT_AMBULATORY_CARE_PROVIDER_SITE_OTHER): Payer: Medicare PPO | Admitting: Internal Medicine

## 2015-11-29 ENCOUNTER — Encounter: Payer: Self-pay | Admitting: Internal Medicine

## 2015-11-29 VITALS — BP 111/71 | HR 90 | Temp 98.3°F | Ht 64.75 in | Wt 143.5 lb

## 2015-11-29 DIAGNOSIS — Z0000 Encounter for general adult medical examination without abnormal findings: Secondary | ICD-10-CM | POA: Diagnosis not present

## 2015-11-29 DIAGNOSIS — C300 Malignant neoplasm of nasal cavity: Secondary | ICD-10-CM

## 2015-11-29 DIAGNOSIS — C319 Malignant neoplasm of accessory sinus, unspecified: Secondary | ICD-10-CM

## 2015-11-29 DIAGNOSIS — E785 Hyperlipidemia, unspecified: Secondary | ICD-10-CM | POA: Diagnosis not present

## 2015-11-29 LAB — LIPID PANEL
Cholesterol: 147 mg/dL (ref 0–200)
HDL: 44.2 mg/dL (ref >39.00)
LDL CALC: 82 mg/dL (ref 0–99)
NONHDL: 102.58
Total CHOL/HDL Ratio: 3
Triglycerides: 105 mg/dL (ref 0.0–149.0)
VLDL: 21 mg/dL (ref 0.0–40.0)

## 2015-11-29 LAB — CBC WITH DIFFERENTIAL/PLATELET
BASOS PCT: 0.5 % (ref 0.0–3.0)
Basophils Absolute: 0 10*3/uL (ref 0.0–0.1)
EOS PCT: 2.9 % (ref 0.0–5.0)
Eosinophils Absolute: 0.1 10*3/uL (ref 0.0–0.7)
HCT: 42.1 % (ref 36.0–46.0)
Hemoglobin: 13.9 g/dL (ref 12.0–15.0)
LYMPHS ABS: 0.7 10*3/uL (ref 0.7–4.0)
Lymphocytes Relative: 16.7 % (ref 12.0–46.0)
MCHC: 32.9 g/dL (ref 30.0–36.0)
MCV: 97.3 fl (ref 78.0–100.0)
MONO ABS: 0.4 10*3/uL (ref 0.1–1.0)
MONOS PCT: 8.8 % (ref 3.0–12.0)
NEUTROS ABS: 2.8 10*3/uL (ref 1.4–7.7)
NEUTROS PCT: 71.1 % (ref 43.0–77.0)
PLATELETS: 186 10*3/uL (ref 150.0–400.0)
RBC: 4.33 Mil/uL (ref 3.87–5.11)
RDW: 14.2 % (ref 11.5–15.5)
WBC: 4 10*3/uL (ref 4.0–10.5)

## 2015-11-29 LAB — COMPREHENSIVE METABOLIC PANEL
ALK PHOS: 85 U/L (ref 39–117)
ALT: 7 U/L (ref 0–35)
AST: 16 U/L (ref 0–37)
Albumin: 3.7 g/dL (ref 3.5–5.2)
BUN: 7 mg/dL (ref 6–23)
CHLORIDE: 104 meq/L (ref 96–112)
CO2: 33 mEq/L — ABNORMAL HIGH (ref 19–32)
Calcium: 9.5 mg/dL (ref 8.4–10.5)
Creatinine, Ser: 0.75 mg/dL (ref 0.40–1.20)
GFR: 84.52 mL/min (ref >60.00)
GLUCOSE: 92 mg/dL (ref 70–99)
POTASSIUM: 4.6 meq/L (ref 3.5–5.1)
SODIUM: 141 meq/L (ref 135–145)
TOTAL PROTEIN: 6.5 g/dL (ref 6.0–8.3)
Total Bilirubin: 1 mg/dL (ref 0.2–1.2)

## 2015-11-29 LAB — TSH: TSH: 2.15 u[IU]/mL (ref 0.35–4.50)

## 2015-11-29 MED ORDER — CYCLOBENZAPRINE HCL 10 MG PO TABS: 10.0000 mg | ORAL_TABLET | Freq: Three times a day (TID) | ORAL | Status: DC

## 2015-11-29 NOTE — Assessment & Plan Note (Addendum)
General medical exam normal except as noted including breast exam. PAP and pelvic deferred as pt s/p hysterectomy. Labs today as ordered.  Immunizations UTD. Information given on healthy diet, exercise, smoking cessation.

## 2015-11-29 NOTE — Progress Notes (Signed)
Pre visit review using our clinic review tool, if applicable. No additional management support is needed unless otherwise documented below in the visit note. 

## 2015-11-29 NOTE — Patient Instructions (Signed)
Health Maintenance, Female Adopting a healthy lifestyle and getting preventive care can go a long way to promote health and wellness. Talk with your health care provider about what schedule of regular examinations is right for you. This is a good chance for you to check in with your provider about disease prevention and staying healthy. In between checkups, there are plenty of things you can do on your own. Experts have done a lot of research about which lifestyle changes and preventive measures are most likely to keep you healthy. Ask your health care provider for more information. WEIGHT AND DIET  Eat a healthy diet  Be sure to include plenty of vegetables, fruits, low-fat dairy products, and lean protein.  Do not eat a lot of foods high in solid fats, added sugars, or salt.  Get regular exercise. This is one of the most important things you can do for your health.  Most adults should exercise for at least 150 minutes each week. The exercise should increase your heart rate and make you sweat (moderate-intensity exercise).  Most adults should also do strengthening exercises at least twice a week. This is in addition to the moderate-intensity exercise.  Maintain a healthy weight  Body mass index (BMI) is a measurement that can be used to identify possible weight problems. It estimates body fat based on height and weight. Your health care provider can help determine your BMI and help you achieve or maintain a healthy weight.  For females 20 years of age and older:   A BMI below 18.5 is considered underweight.  A BMI of 18.5 to 24.9 is normal.  A BMI of 25 to 29.9 is considered overweight.  A BMI of 30 and above is considered obese.  Watch levels of cholesterol and blood lipids  You should start having your blood tested for lipids and cholesterol at 58 years of age, then have this test every 5 years.  You may need to have your cholesterol levels checked more often if:  Your lipid  or cholesterol levels are high.  You are older than 58 years of age.  You are at high risk for heart disease.  CANCER SCREENING   Lung Cancer  Lung cancer screening is recommended for adults 55-80 years old who are at high risk for lung cancer because of a history of smoking.  A yearly low-dose CT scan of the lungs is recommended for people who:  Currently smoke.  Have quit within the past 15 years.  Have at least a 30-pack-year history of smoking. A pack year is smoking an average of one pack of cigarettes a day for 1 year.  Yearly screening should continue until it has been 15 years since you quit.  Yearly screening should stop if you develop a health problem that would prevent you from having lung cancer treatment.  Breast Cancer  Practice breast self-awareness. This means understanding how your breasts normally appear and feel.  It also means doing regular breast self-exams. Let your health care provider know about any changes, no matter how small.  If you are in your 20s or 30s, you should have a clinical breast exam (CBE) by a health care provider every 1-3 years as part of a regular health exam.  If you are 40 or older, have a CBE every year. Also consider having a breast X-ray (mammogram) every year.  If you have a family history of breast cancer, talk to your health care provider about genetic screening.  If you   are at high risk for breast cancer, talk to your health care provider about having an MRI and a mammogram every year.  Breast cancer gene (BRCA) assessment is recommended for women who have family members with BRCA-related cancers. BRCA-related cancers include:  Breast.  Ovarian.  Tubal.  Peritoneal cancers.  Results of the assessment will determine the need for genetic counseling and BRCA1 and BRCA2 testing. Cervical Cancer Your health care provider may recommend that you be screened regularly for cancer of the pelvic organs (ovaries, uterus, and  vagina). This screening involves a pelvic examination, including checking for microscopic changes to the surface of your cervix (Pap test). You may be encouraged to have this screening done every 3 years, beginning at age 21.  For women ages 30-65, health care providers may recommend pelvic exams and Pap testing every 3 years, or they may recommend the Pap and pelvic exam, combined with testing for human papilloma virus (HPV), every 5 years. Some types of HPV increase your risk of cervical cancer. Testing for HPV may also be done on women of any age with unclear Pap test results.  Other health care providers may not recommend any screening for nonpregnant women who are considered low risk for pelvic cancer and who do not have symptoms. Ask your health care provider if a screening pelvic exam is right for you.  If you have had past treatment for cervical cancer or a condition that could lead to cancer, you need Pap tests and screening for cancer for at least 20 years after your treatment. If Pap tests have been discontinued, your risk factors (such as having a new sexual partner) need to be reassessed to determine if screening should resume. Some women have medical problems that increase the chance of getting cervical cancer. In these cases, your health care provider may recommend more frequent screening and Pap tests. Colorectal Cancer  This type of cancer can be detected and often prevented.  Routine colorectal cancer screening usually begins at 58 years of age and continues through 58 years of age.  Your health care provider may recommend screening at an earlier age if you have risk factors for colon cancer.  Your health care provider may also recommend using home test kits to check for hidden blood in the stool.  A small camera at the end of a tube can be used to examine your colon directly (sigmoidoscopy or colonoscopy). This is done to check for the earliest forms of colorectal  cancer.  Routine screening usually begins at age 50.  Direct examination of the colon should be repeated every 5-10 years through 58 years of age. However, you may need to be screened more often if early forms of precancerous polyps or small growths are found. Skin Cancer  Check your skin from head to toe regularly.  Tell your health care provider about any new moles or changes in moles, especially if there is a change in a mole's shape or color.  Also tell your health care provider if you have a mole that is larger than the size of a pencil eraser.  Always use sunscreen. Apply sunscreen liberally and repeatedly throughout the day.  Protect yourself by wearing long sleeves, pants, a wide-brimmed hat, and sunglasses whenever you are outside. HEART DISEASE, DIABETES, AND HIGH BLOOD PRESSURE   High blood pressure causes heart disease and increases the risk of stroke. High blood pressure is more likely to develop in:  People who have blood pressure in the high end   of the normal range (130-139/85-89 mm Hg).  People who are overweight or obese.  People who are African American.  If you are 38-23 years of age, have your blood pressure checked every 3-5 years. If you are 61 years of age or older, have your blood pressure checked every year. You should have your blood pressure measured twice--once when you are at a hospital or clinic, and once when you are not at a hospital or clinic. Record the average of the two measurements. To check your blood pressure when you are not at a hospital or clinic, you can use:  An automated blood pressure machine at a pharmacy.  A home blood pressure monitor.  If you are between 45 years and 39 years old, ask your health care provider if you should take aspirin to prevent strokes.  Have regular diabetes screenings. This involves taking a blood sample to check your fasting blood sugar level.  If you are at a normal weight and have a low risk for diabetes,  have this test once every three years after 58 years of age.  If you are overweight and have a high risk for diabetes, consider being tested at a younger age or more often. PREVENTING INFECTION  Hepatitis B  If you have a higher risk for hepatitis B, you should be screened for this virus. You are considered at high risk for hepatitis B if:  You were born in a country where hepatitis B is common. Ask your health care provider which countries are considered high risk.  Your parents were born in a high-risk country, and you have not been immunized against hepatitis B (hepatitis B vaccine).  You have HIV or AIDS.  You use needles to inject street drugs.  You live with someone who has hepatitis B.  You have had sex with someone who has hepatitis B.  You get hemodialysis treatment.  You take certain medicines for conditions, including cancer, organ transplantation, and autoimmune conditions. Hepatitis C  Blood testing is recommended for:  Everyone born from [birth year] through 1963.  Anyone with known risk factors for hepatitis C. Sexually transmitted infections (STIs)  You should be screened for sexually transmitted infections (STIs) including gonorrhea and chlamydia if:  You are sexually active and are younger than 58 years of age.  You are older than 58 years of age and your health care provider tells you that you are at risk for this type of infection.  Your sexual activity has changed since you were last screened and you are at an increased risk for chlamydia or gonorrhea. Ask your health care provider if you are at risk.  If you do not have HIV, but are at risk, it may be recommended that you take a prescription medicine daily to prevent HIV infection. This is called pre-exposure prophylaxis (PrEP). You are considered at risk if:  You are sexually active and do not regularly use condoms or know the HIV status of your partner(s).  You take drugs by injection.  You are sexually  active with a partner who has HIV. Talk with your health care provider about whether you are at high risk of being infected with HIV. If you choose to begin PrEP, you should first be tested for HIV. You should then be tested every 3 months for as long as you are taking PrEP.  PREGNANCY   If you are premenopausal and you may become pregnant, ask your health care provider about preconception counseling.  If you may  become pregnant, take 400 to 800 micrograms (mcg) of folic acid every day.  If you want to prevent pregnancy, talk to your health care provider about birth control (contraception). OSTEOPOROSIS AND MENOPAUSE   Osteoporosis is a disease in which the bones lose minerals and strength with aging. This can result in serious bone fractures. Your risk for osteoporosis can be identified using a bone density scan.  If you are 61 years of age or older, or if you are at risk for osteoporosis and fractures, ask your health care provider if you should be screened.  Ask your health care provider whether you should take a calcium or vitamin D supplement to lower your risk for osteoporosis.  Menopause may have certain physical symptoms and risks.  Hormone replacement therapy may reduce some of these symptoms and risks. Talk to your health care provider about whether hormone replacement therapy is right for you.  HOME CARE INSTRUCTIONS   Schedule regular health, dental, and eye exams.  Stay current with your immunizations.   Do not use any tobacco products including cigarettes, chewing tobacco, or electronic cigarettes.  If you are pregnant, do not drink alcohol.  If you are breastfeeding, limit how much and how often you drink alcohol.  Limit alcohol intake to no more than 1 drink per day for nonpregnant women. One drink equals 12 ounces of beer, 5 ounces of wine, or 1 ounces of hard liquor.  Do not use street drugs.  Do not share needles.  Ask your health care provider for help if  you need support or information about quitting drugs.  Tell your health care provider if you often feel depressed.  Tell your health care provider if you have ever been abused or do not feel safe at home.   This information is not intended to replace advice given to you by your health care provider. Make sure you discuss any questions you have with your health care provider.   Document Released: 04/27/2011 Document Revised: 11/02/2014 Document Reviewed: 09/13/2013 Elsevier Interactive Patient Education [employer].

## 2015-11-29 NOTE — Progress Notes (Signed)
Subjective:    Patient ID: Heather Beasley, female    DOB: 1956-02-20, 60 y.o.   MRN: [id number]  HPI  60YO female presents for physical exam.  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 Completed radiation therapy. Scheduled for PET CT in March.  Also had screening chest CT which showed 12mm nodule. Planned for follow up CT in 3 months.  Notes some increased anxiety with recent cancer treatments, but generally doing well.   Wt Readings from Last 3 Encounters:  11/29/15 143 lb 8 oz (65.091 kg)  11/14/15 145 lb 12.8 oz (66.134 kg)  11/12/15 147 lb (66.679 kg)   BP Readings from Last 3 Encounters:  11/29/15 111/71  11/14/15 128/82  10/17/15 138/73    Past Medical History  Diagnosis Date  . Asthma   . Diverticulosis   . Headaches, cluster   . Colon polyps   . IBS (irritable bowel syndrome)   . Allergic rhinitis   . Arthritis   . Cervical spondylosis   . Glaucoma   . Kidney stones   . Hyperlipidemia   . Hypertension   . Tobacco abuse   . Depression   . GERD (gastroesophageal reflux disease)   . Cancer ([place])     melanoma  . Neuropathy ([place])   . PONV (postoperative nausea and vomiting)   . Colitis   . Cancer of nasal cavity and sinus ([place]) 07/07/2015   Family History  Problem Relation Age of Onset  . Hypertension Mother   . Hyperlipidemia Mother   . Diabetes Mother   . Colon cancer Father   . Pancreatic cancer Father   . Cancer Father     Colon & Pancreatic  . Breast cancer Paternal Grandmother   . Diabetes Paternal Grandmother   . Coronary artery disease Paternal Grandmother   . Heart failure Paternal Grandmother   . Cancer Paternal Grandmother     breast  . Stroke Brother    Past Surgical History  Procedure Laterality Date  . Appendectomy  1998  . Abdominal hysterectomy  1998  . Lumbar spine surgery  07/2004  . Vaginal delivery      x2  . Arm fx repair  2001  . Cervical fusion  2010  . Cholecystectomy  08/2011    Dr Rochel Brome  . Esophagogastroduodenoscopy  2012    Dr Tiffany Kocher  . Colonoscopy      Dr Tiffany Kocher  . Tonsillectomy  1968  . Cystoscopy      Dr. Bernardo Heater  . Back surgery    . Dilation and curettage of uterus    . Fracture surgery  2001    plate in right wrist and arm  . Image guided sinus surgery N/A 06/25/2015    Procedure: IMAGE GUIDED SINUS SURGERY;  Surgeon: Beverly Gust, MD;  Location: [facility] ORS;  Service: ENT;  Laterality: N/A;  . Polypectomy N/A 06/25/2015    Procedure: POLYPECTOMY NASAL;  Surgeon: Beverly Gust, MD;  Location: [facility] ORS;  Service: ENT;  Laterality: N/A;   Social History   Social History  . Marital Status: Married    Spouse Name: N/A  . Number of Children: 2  . Years of Education: N/A   Occupational History  . Cashier    Social History Main Topics  . Smoking status: Current Every Day Smoker -- 0.50 packs/day for 39 years    Types: Cigarettes  . Smokeless tobacco: Never Used     Comment: e cigs currently--trying to quit  .  Alcohol Use: No  . Drug Use: No  . Sexual Activity:    Partners: Male   Other Topics Concern  . None   Social History Narrative   Lives in [place] with husband. Dog in home. Work - disabled for neck and back pain.    Review of Systems  Constitutional: Positive for fatigue. Negative for fever, chills, appetite change and unexpected weight change.  HENT: Positive for dental problem (dry mouth) and trouble swallowing (due to dry mouth). Negative for congestion.   Eyes: Negative for visual disturbance.  Respiratory: Negative for cough, shortness of breath and wheezing.   Cardiovascular: Negative for chest pain and leg swelling.  Gastrointestinal: Negative for nausea, vomiting, abdominal pain, diarrhea and constipation.  Musculoskeletal: Negative for myalgias and arthralgias.  Skin: Negative for color change and rash.  Hematological: Negative for adenopathy. Does not bruise/bleed easily.  Psychiatric/Behavioral: Negative for sleep  disturbance and dysphoric mood. The patient is nervous/anxious.        Objective:    BP 111/71 mmHg  Pulse 90  Temp(Src) 98.3 F (36.8 C) (Oral)  Ht 5' 4.75" (1.645 m)  Wt 143 lb 8 oz (65.091 kg)  BMI 24.05 kg/m2  SpO2 97% Physical Exam  Constitutional: She is oriented to person, place, and time. She appears well-developed and well-nourished. No distress.  HENT:  Head: Normocephalic and atraumatic.  Right Ear: External ear normal.  Left Ear: External ear normal.  Nose: Nose normal.  Mouth/Throat: Mucous membranes are dry. Posterior oropharyngeal erythema present. No oropharyngeal exudate.  Eyes: Conjunctivae are normal. Pupils are equal, round, and reactive to light. Right eye exhibits no discharge. Left eye exhibits no discharge. No scleral icterus.  Neck: Normal range of motion. Neck supple. No tracheal deviation present. No thyromegaly present.  Cardiovascular: Normal rate, regular rhythm, normal heart sounds and intact distal pulses.  Exam reveals no gallop and no friction rub.   No murmur heard. Pulmonary/Chest: Effort normal and breath sounds normal. No respiratory distress. She has no wheezes. She has no rales. She exhibits no tenderness.  Musculoskeletal: Normal range of motion. She exhibits no edema or tenderness.  Lymphadenopathy:    She has no cervical adenopathy.  Neurological: She is alert and oriented to person, place, and time. No cranial nerve deficit. She exhibits normal muscle tone. Coordination normal.  Skin: Skin is warm and dry. No rash noted. She is not diaphoretic. No erythema. No pallor.  Psychiatric: She has a normal mood and affect. Her behavior is normal. Judgment and thought content normal.          Assessment & Plan:   Problem List Items Addressed This Visit      Unprioritized   Cancer of nasal cavity and sinus ([place])   Routine general medical examination at a health care facility - Primary    General medical exam normal except as noted  including breast exam. PAP and pelvic deferred as pt s/p hysterectomy. Labs today as ordered.  Immunizations UTD. Information given on healthy diet, exercise, smoking cessation.      Relevant Orders   CBC with Differential/Platelet   Comprehensive metabolic panel   Lipid panel   MM Digital Screening   POCT Urinalysis Dipstick   TSH       Return in about 6 months (around 05/28/2016) for Recheck.

## 2015-12-11 ENCOUNTER — Encounter: Payer: Self-pay | Admitting: Radiation Oncology

## 2015-12-11 ENCOUNTER — Ambulatory Visit
Admission: RE | Admit: 2015-12-11 | Discharge: 2015-12-11 | Disposition: A | Discharge status: Home / Self Care | Payer: Medicare PPO | Source: Ambulatory Visit | Attending: Radiation Oncology | Admitting: Radiation Oncology

## 2015-12-11 ENCOUNTER — Other Ambulatory Visit: Payer: Self-pay | Admitting: *Deleted

## 2015-12-11 VITALS — BP 134/89 | HR 86 | Temp 95.5°F | Resp 18 | Ht 66.0 in | Wt 141.3 lb

## 2015-12-11 DIAGNOSIS — C319 Malignant neoplasm of accessory sinus, unspecified: Principal | ICD-10-CM

## 2015-12-11 DIAGNOSIS — C300 Malignant neoplasm of nasal cavity: Secondary | ICD-10-CM

## 2015-12-11 MED ORDER — FLUCONAZOLE 100 MG PO TABS: 100.0000 mg | ORAL_TABLET | Freq: Every day | ORAL | Status: DC

## 2015-12-11 NOTE — Progress Notes (Signed)
Radiation Oncology Follow up Note  Name: Heather Beasley   Date:   12/11/2015 MRN:  [id number] DOB: 11/13/1955    This 60 y.o. 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  REFERRING PROVIDER: Forest Gleason, MD  HPI: Patient is a 60 year old female now out 2 months having completed combined modality treatment with chemotherapy and radiation therapy for a adenocarcinoma the nasal cavities T3 by ethmoid sinus involvement.. She is seen today in routine follow-up is doing fairly well she still has some oral candidiasis some decreased taste. She's been seen by ENT who reports no evidence of disease. She scheduled for a PET CT scan in March. She recently had a low dose CT scan of the chest showing 11 mm lesion which is being followed. She specifically denies head and neck pain or dysphagia.  COMPLICATIONS OF TREATMENT: none  FOLLOW UP COMPLIANCE: keeps appointments   PHYSICAL EXAM:  BP 134/89 mmHg  Pulse 86  Temp(Src) 95.5 F (35.3 C)  Resp 18  Ht 5\' 6"  (1.676 m)  Wt 141 lb 5 oz (64.1 kg)  BMI 22.82 kg/m2 Oral cavity shows oral candidiasis. Examination nasal cavity shows no evidence of disease. Indirect mirror examination shows upper airway clear vallecula and base of tongue within normal limits. Neck is clear without evidence of subject gastric cervical or supraclavicular adenopathy. Well-developed well-nourished patient in NAD. HEENT reveals PERLA, EOMI, discs not visualized.  Oral cavity is clear. No oral mucosal lesions are identified. Neck is clear without evidence of cervical or supraclavicular adenopathy. Lungs are clear to A&P. Cardiac examination is essentially unremarkable with regular rate and rhythm without murmur rub or thrill. Abdomen is benign with no organomegaly or masses noted. Motor sensory and DTR levels are equal and  symmetric in the upper and lower extremities. Cranial nerves II through XII are grossly intact. Proprioception is intact. No peripheral adenopathy or edema is identified. No motor or sensory levels are noted. Crude visual fields are within normal range.  RADIOLOGY RESULTS: PET scan in March will be reviewed  PLAN: At the present time she continues to do well with no evidence of disease. I am starting her on Diflucan 150 mg once a day for 2 weeks to try to eradicate once and for all this oral candidiasis. Not sure the etiology of that. Otherwise I'm please were overall progress. She continues monthly follow-up with ENT. I have asked to see her back in 3-4 months for follow-up. Patient knows to call sooner with any concerns.  I would like to take this opportunity for allowing me to participate in the care of your patient.Armstead Peaks., MD

## 2015-12-13 ENCOUNTER — Ambulatory Visit: Payer: Medicare PPO

## 2015-12-16 ENCOUNTER — Ambulatory Visit
Admission: RE | Admit: 2015-12-16 | Discharge: 2015-12-16 | Disposition: A | Discharge status: Home / Self Care | Payer: Medicare PPO | Source: Ambulatory Visit | Attending: Internal Medicine | Admitting: Internal Medicine

## 2015-12-16 ENCOUNTER — Other Ambulatory Visit: Payer: Self-pay | Admitting: Internal Medicine

## 2015-12-16 DIAGNOSIS — Z1231 Encounter for screening mammogram for malignant neoplasm of breast: Secondary | ICD-10-CM | POA: Insufficient documentation

## 2015-12-16 DIAGNOSIS — Z0000 Encounter for general adult medical examination without abnormal findings: Secondary | ICD-10-CM

## 2015-12-20 DIAGNOSIS — S52134D Nondisplaced fracture of neck of right radius, subsequent encounter for closed fracture with routine healing: Secondary | ICD-10-CM | POA: Diagnosis not present

## 2016-01-07 ENCOUNTER — Ambulatory Visit
Admission: RE | Admit: 2016-01-07 | Discharge: 2016-01-07 | Disposition: A | Discharge status: Home / Self Care | Payer: Medicare PPO | Source: Ambulatory Visit | Attending: Oncology | Admitting: Oncology

## 2016-01-07 DIAGNOSIS — J3489 Other specified disorders of nose and nasal sinuses: Secondary | ICD-10-CM | POA: Diagnosis not present

## 2016-01-07 DIAGNOSIS — R918 Other nonspecific abnormal finding of lung field: Secondary | ICD-10-CM | POA: Insufficient documentation

## 2016-01-07 DIAGNOSIS — C319 Malignant neoplasm of accessory sinus, unspecified: Secondary | ICD-10-CM | POA: Diagnosis not present

## 2016-01-07 DIAGNOSIS — C300 Malignant neoplasm of nasal cavity: Secondary | ICD-10-CM | POA: Diagnosis not present

## 2016-01-07 LAB — GLUCOSE, CAPILLARY: Glucose-Capillary: 79 mg/dL (ref 65–99)

## 2016-01-07 MED ORDER — FLUDEOXYGLUCOSE F - 18 (FDG) INJECTION
12.1200 | Freq: Once | INTRAVENOUS | Status: AC | PRN
  Administered 2016-01-07: 12.12 via INTRAVENOUS

## 2016-01-09 ENCOUNTER — Ambulatory Visit: Payer: Medicare PPO | Admitting: Oncology

## 2016-01-09 ENCOUNTER — Other Ambulatory Visit: Payer: Medicare PPO

## 2016-01-16 ENCOUNTER — Inpatient Hospital Stay: Payer: Medicare PPO

## 2016-01-16 ENCOUNTER — Encounter: Payer: Self-pay | Admitting: Oncology

## 2016-01-16 ENCOUNTER — Inpatient Hospital Stay: Payer: Medicare PPO | Attending: Oncology | Admitting: Oncology

## 2016-01-16 VITALS — BP 136/82 | HR 85 | Temp 94.4°F | Resp 18 | Wt 134.9 lb

## 2016-01-16 DIAGNOSIS — K589 Irritable bowel syndrome without diarrhea: Secondary | ICD-10-CM | POA: Diagnosis not present

## 2016-01-16 DIAGNOSIS — C319 Malignant neoplasm of accessory sinus, unspecified: Principal | ICD-10-CM

## 2016-01-16 DIAGNOSIS — I10 Essential (primary) hypertension: Secondary | ICD-10-CM | POA: Diagnosis not present

## 2016-01-16 DIAGNOSIS — C300 Malignant neoplasm of nasal cavity: Secondary | ICD-10-CM

## 2016-01-16 DIAGNOSIS — Z79899 Other long term (current) drug therapy: Secondary | ICD-10-CM | POA: Diagnosis not present

## 2016-01-16 DIAGNOSIS — Z923 Personal history of irradiation: Secondary | ICD-10-CM | POA: Diagnosis not present

## 2016-01-16 DIAGNOSIS — R682 Dry mouth, unspecified: Secondary | ICD-10-CM | POA: Diagnosis not present

## 2016-01-16 DIAGNOSIS — R911 Solitary pulmonary nodule: Secondary | ICD-10-CM | POA: Diagnosis not present

## 2016-01-16 DIAGNOSIS — K219 Gastro-esophageal reflux disease without esophagitis: Secondary | ICD-10-CM | POA: Insufficient documentation

## 2016-01-16 DIAGNOSIS — M1990 Unspecified osteoarthritis, unspecified site: Secondary | ICD-10-CM | POA: Insufficient documentation

## 2016-01-16 DIAGNOSIS — Z85820 Personal history of malignant melanoma of skin: Secondary | ICD-10-CM | POA: Diagnosis not present

## 2016-01-16 DIAGNOSIS — E785 Hyperlipidemia, unspecified: Secondary | ICD-10-CM | POA: Diagnosis not present

## 2016-01-16 DIAGNOSIS — F17210 Nicotine dependence, cigarettes, uncomplicated: Secondary | ICD-10-CM | POA: Diagnosis not present

## 2016-01-16 LAB — COMPREHENSIVE METABOLIC PANEL
ALK PHOS: 90 U/L (ref 38–126)
ALT: 11 U/L — ABNORMAL LOW (ref 14–54)
AST: 24 U/L (ref 15–41)
Albumin: 4.2 g/dL (ref 3.5–5.0)
Anion gap: 6 (ref 5–15)
BILIRUBIN TOTAL: 0.7 mg/dL (ref 0.3–1.2)
BUN: 6 mg/dL (ref 6–20)
CALCIUM: 9.4 mg/dL (ref 8.9–10.3)
CO2: 29 mmol/L (ref 22–32)
CREATININE: 0.8 mg/dL (ref 0.44–1.00)
Chloride: 103 mmol/L (ref 101–111)
GFR calc Af Amer: >60 mL/min (ref >60)
GLUCOSE: 115 mg/dL — AB (ref 65–99)
Potassium: 4.5 mmol/L (ref 3.5–5.1)
Sodium: 138 mmol/L (ref 135–145)
TOTAL PROTEIN: 7 g/dL (ref 6.5–8.1)

## 2016-01-16 LAB — CBC WITH DIFFERENTIAL/PLATELET
BASOS ABS: 0 10*3/uL (ref 0–0.1)
Basophils Relative: 1 %
EOS PCT: 3 %
Eosinophils Absolute: 0.1 10*3/uL (ref 0–0.7)
HCT: 43.3 % (ref 35.0–47.0)
Hemoglobin: 14.7 g/dL (ref 12.0–16.0)
LYMPHS PCT: 24 %
Lymphs Abs: 0.6 10*3/uL — ABNORMAL LOW (ref 1.0–3.6)
MCH: 32.6 pg (ref 26.0–34.0)
MCHC: 33.9 g/dL (ref 32.0–36.0)
MCV: 96.1 fL (ref 80.0–100.0)
MONO ABS: 0.3 10*3/uL (ref 0.2–0.9)
Monocytes Relative: 10 %
Neutro Abs: 1.6 10*3/uL (ref 1.4–6.5)
Neutrophils Relative %: 62 %
PLATELETS: 162 10*3/uL (ref 150–440)
RBC: 4.51 MIL/uL (ref 3.80–5.20)
RDW: 12.8 % (ref 11.5–14.5)
WBC: 2.6 10*3/uL — ABNORMAL LOW (ref 3.6–11.0)

## 2016-01-16 MED ORDER — PILOCARPINE HCL 5 MG PO TABS: 10.0000 mg | ORAL_TABLET | Freq: Three times a day (TID) | ORAL | Status: DC

## 2016-01-16 MED ORDER — DOXYCYCLINE HYCLATE 100 MG PO TABS: 100.0000 mg | ORAL_TABLET | Freq: Two times a day (BID) | ORAL | Status: DC

## 2016-01-16 NOTE — Progress Notes (Signed)
[place] @ [facility] [phone]  Fax:([phone]) [place] OB: 08-03-1956  MR#: [id number]  IRW#:[id number]  Patient Care Team: Jackolyn Confer, MD as PCP - General (Internal Medicine) Earnie Larsson, MD (Neurosurgery) Leeroy Cha, MD (Neurosurgery) Manya Silvas, MD (Gastroenterology) Jonette Mate, MD (General Surgery)  CHIEF COMPLAINT:  Chief Complaint  Patient presents with  . Cancer of nasal cavity and sinus   1.  Polypoid mass in the right nasal cavity status post resection consistent with adenocarcinoma with lymphovascular invasion T1 N0 M0 tumor (August of 2016) 2.  PET scan is positive with possibility of tumor in the right ethmoid sinus 3.  Small lung nodule in the right lower lobe and left upper lobe appears to be benign needs follow-up 4.  Patient was started radiation therapy to the involved field and lymph node as per Dr. Baruch Gouty in September of 2016 5.  Patient has finished radiation therapy  Of involved field in   decof 2016 VISIT DIAGNOSIS:  , Adenocarcinoma of the right nasal cavity  Cancer of the right nasal cavity  No history exists.    Oncology Flowsheet 05/13/2011 08/04/2013 06/25/2015  dexamethasone (DECADRON) IJ - - -  dexamethasone (DECADRON) IV - 10 mg -  diazepam (VALIUM) PO 10 mg - -  LORazepam (ATIVAN) IV - 1 mg -  metoCLOPramide (REGLAN) IV - 10 mg -  ondansetron (ZOFRAN) IM 4 mg - -  ondansetron (ZOFRAN) IV - 4 mg -    INTERVAL HISTORY: \ 60 year old lady was recently finished radiation therapy to the right nasal cavity.  Complains of redness of the right high as well as purulent discharge.  Complains of dryness of the mouth.  Patient is on tapering dose of Decadron already started Decadron 1 tablet every other day.  gradually recovering from the side effect of radiation therapy.  Recently had ENT evaluation and no evidence of recurrent disease was found. Patient is here for further follow-up  had a repeat PET scan done.  Patient also complains of nodule in the left ear and some redness and tenderness according to her that started after radiation mask was used continuously for several days. Continue his complains dryness in the mouth.  But pilocarpine tablets are working. Had ENT evaluation done a few months ago and has been reported to be without any evidence of recurrent disease      REVIEW OF SYSTEMS:   GENERAL:  Feels good.  Active.  No fevers, sweats or weight loss. PERFORMANCE STATUS (ECOG):  0 HEENT:  Redness of the right eye and purulent discharge and dryness of the mouth as described above  Lungs: No shortness of breath or cough.  No hemoptysis. Cardiac:  No chest pain, palpitations, orthopnea, or PND. GI:  No nausea, vomiting, diarrhea, constipation, melena or hematochezia. GU:  No urgency, frequency, dysuria, or hematuria.  Recent had a hysterectomy Musculoskeletal:  No back pain.  No joint pain.  No muscle tenderness.  Chronic back pain on Flexeril Extremities:  No pain or swelling. Skin:  No rashes or skin changes. Neuro:  No headache, numbness or weakness, balance or coordination issues.  Migraine headache Endocrine:  No diabetes, thyroid issues, hot flashes or night sweats. Psych:  No mood changes, depression or anxiety. Pain:  No focal pain. Review of systems:  All other systems reviewed and found to be negative.  As per HPI. Otherwise, a complete review of systems is negatve.  PAST MEDICAL HISTORY: Past  Medical History  Diagnosis Date  . Asthma   . Diverticulosis   . Headaches, cluster   . Colon polyps   . IBS (irritable bowel syndrome)   . Allergic rhinitis   . Arthritis   . Cervical spondylosis   . Glaucoma   . Kidney stones   . Hyperlipidemia   . Hypertension   . Tobacco abuse   . Depression   . GERD (gastroesophageal reflux disease)   . Neuropathy ([place])   . PONV (postoperative nausea and vomiting)   . Colitis   . Cancer ([place])      melanoma  . Cancer of nasal cavity and sinus ([place]) 07/07/2015    radiation    PAST SURGICAL HISTORY: Past Surgical History  Procedure Laterality Date  . Appendectomy  1998  . Abdominal hysterectomy  1998  . Lumbar spine surgery  07/2004  . Vaginal delivery      x2  . Arm fx repair  2001  . Cervical fusion  2010  . Cholecystectomy  08/2011    Dr Rochel Brome  . Esophagogastroduodenoscopy  2012    Dr Tiffany Kocher  . Colonoscopy      Dr Tiffany Kocher  . Tonsillectomy  1968  . Cystoscopy      Dr. Bernardo Heater  . Back surgery    . Dilation and curettage of uterus    . Fracture surgery  2001    plate in right wrist and arm  . Image guided sinus surgery N/A 06/25/2015    Procedure: IMAGE GUIDED SINUS SURGERY;  Surgeon: Beverly Gust, MD;  Location: [facility] ORS;  Service: ENT;  Laterality: N/A;  . Polypectomy N/A 06/25/2015    Procedure: POLYPECTOMY NASAL;  Surgeon: Beverly Gust, MD;  Location: [facility] ORS;  Service: ENT;  Laterality: N/A;    FAMILY HISTORY Family History  Problem Relation Age of Onset  . Hypertension Mother   . Hyperlipidemia Mother   . Diabetes Mother   . Colon cancer Father   . Pancreatic cancer Father   . Cancer Father     Colon & Pancreatic  . Breast cancer Paternal Grandmother 73  . Diabetes Paternal Grandmother   . Coronary artery disease Paternal Grandmother   . Heart failure Paternal Grandmother   . Cancer Paternal Grandmother     breast  . Stroke Brother     GYNECOLOGIC HISTORY:  No LMP recorded. Patient has had a hysterectomy.     ADVANCED DIRECTIVES:  Patient does not have any living will or healthcare power of attorney.  Information was given .  Available resources had been discussed.  We will follow-up on subsequent appointments regarding this issue  HEALTH MAINTENANCE: Social History  Substance Use Topics  . Smoking status: Current Every Day Smoker -- 0.50 packs/day for 39 years    Types: Cigarettes  . Smokeless tobacco: Never Used     Comment: e  cigs currently--trying to quit  . Alcohol Use: No      Allergies  Allergen Reactions  . Albumin (Human) Rash  . Amoxicillin-Pot Clavulanate Hives  . Gabapentin Shortness Of Breath  . Latex Dermatitis and Rash  . Betadine [Povidone Iodine] Dermatitis, Rash and Other (See Comments)    Skin burning Topical betadine and iodine have this reaction with patient.  IV contrast is NOT a problem.  jkl  . Sucralfate     Other reaction(s): Other (See Comments) Abdominal Pain  . Amoxicillin     REACTION: rash  . Chantix [Varenicline Tartrate]  Heart racing  . Erythromycin     REACTION: rash  . Sucralfate Nausea And Vomiting    Heart racing Lightheaded     Current Outpatient Prescriptions  Medication Sig Dispense Refill  . albuterol (PROVENTIL HFA;VENTOLIN HFA) 108 (90 BASE) MCG/ACT inhaler Inhale 2 puffs into the lungs every 6 (six) hours as needed. 18 g 2  . aspirin EC 81 MG tablet Take 81 mg by mouth daily.    . cetirizine (ZYRTEC) 10 MG tablet Take 10 mg by mouth daily. Allergies.    . cyclobenzaprine (FLEXERIL) 10 MG tablet Take 1 tablet (10 mg total) by mouth 3 (three) times daily. 90 tablet 6  . fluconazole (DIFLUCAN) 100 MG tablet Take 1 tablet (100 mg total) by mouth daily. 14 tablet 0  . loperamide (IMODIUM A-D) 2 MG tablet Take 2 mg by mouth 4 (four) times daily as needed for diarrhea or loose stools.    . Multiple Vitamins-Minerals (CENTRUM ADULTS PO) Take 1 tablet by mouth.    . neomycin-polymyxin b-dexamethasone (MAXITROL) 3.5-10000-0.1 SUSP     . ondansetron (ZOFRAN) 4 MG tablet Take 1 tablet (4 mg total) by mouth every 6 (six) hours as needed for nausea or vomiting. 40 tablet 1  . pantoprazole (PROTONIX) 40 MG tablet Take 1 tablet (40 mg total) by mouth 2 (two) times daily. 60 tablet 11  . pilocarpine (SALAGEN) 5 MG tablet Take 2 tablets (10 mg total) by mouth 2 (two) times daily. 120 tablet 3   No current facility-administered medications for this visit.     OBJECTIVE: PHYSICAL EXAM: GENERAL:  Well developed, well nourished, sitting comfortably in the exam room in no acute distress. MENTAL STATUS:  Alert and oriented to person, place and time.  EYES:   Pupils equal round and reactive to light and accomodation.  No conjunctivitis or scleral icterus. ENT:  Oropharynx clear without lesion.  Tongue normal. Mucous membranes Dry. Small nodule in the left year along with some redness RESPIRATORY:  Clear to auscultation without rales, wheezes or rhonchi. CARDIOVASCULAR:  Regular rate and rhythm without murmur, rub or gallop.  ABDOMEN:  Soft, non-tender, with active bowel sounds, and no hepatosplenomegaly.  No masses. BACK:  No CVA tenderness.  No tenderness on percussion of the back or rib cage. SKIN:  No rashes, ulcers or lesions. EXTREMITIES: No edema, no skin discoloration or tenderness.  No palpable cords. LYMPH NODES: No palpable cervical, supraclavicular, axillary or inguinal adenopathy  NEUROLOGICAL: Unremarkable. PSYCH:  Appropriate.  Filed Vitals:   01/16/16 0930  BP: 136/82  Pulse: 85  Temp: 94.4 F (34.7 C)  Resp: 18     Body mass index is 21.79 kg/(m^2).    ECOG FS:0 - Asymptomatic  LAB RESULTS:  Appointment on 01/16/2016  Component Date Value Ref Range Status  . WBC 01/16/2016 2.6* 3.6 - 11.0 K/uL Final  . RBC 01/16/2016 4.51  3.80 - 5.20 MIL/uL Final  . Hemoglobin 01/16/2016 14.7  12.0 - 16.0 g/dL Final  . HCT 01/16/2016 43.3  35.0 - 47.0 % Final  . MCV 01/16/2016 96.1  80.0 - 100.0 fL Final  . MCH 01/16/2016 32.6  26.0 - 34.0 pg Final  . MCHC 01/16/2016 33.9  32.0 - 36.0 g/dL Final  . RDW 01/16/2016 12.8  11.5 - 14.5 % Final  . Platelets 01/16/2016 162  150 - 440 K/uL Final  . Neutrophils Relative % 01/16/2016 62   Final  . Neutro Abs 01/16/2016 1.6  1.4 - 6.5 K/uL Final  .  Lymphocytes Relative 01/16/2016 24   Final  . Lymphs Abs 01/16/2016 0.6* 1.0 - 3.6 K/uL Final  . Monocytes Relative 01/16/2016 10   Final  .  Monocytes Absolute 01/16/2016 0.3  0.2 - 0.9 K/uL Final  . Eosinophils Relative 01/16/2016 3   Final  . Eosinophils Absolute 01/16/2016 0.1  0 - 0.7 K/uL Final  . Basophils Relative 01/16/2016 1   Final  . Basophils Absolute 01/16/2016 0.0  0 - 0.1 K/uL Final  . Sodium 01/16/2016 138  135 - 145 mmol/L Final  . Potassium 01/16/2016 4.5  3.5 - 5.1 mmol/L Final  . Chloride 01/16/2016 103  101 - 111 mmol/L Final  . CO2 01/16/2016 29  22 - 32 mmol/L Final  . Glucose, Bld 01/16/2016 115* 65 - 99 mg/dL Final  . BUN 01/16/2016 6  6 - 20 mg/dL Final  . Creatinine, Ser 01/16/2016 0.80  0.44 - 1.00 mg/dL Final  . Calcium 01/16/2016 9.4  8.9 - 10.3 mg/dL Final  . Total Protein 01/16/2016 7.0  6.5 - 8.1 g/dL Final  . Albumin 01/16/2016 4.2  3.5 - 5.0 g/dL Final  . AST 01/16/2016 24  15 - 41 U/L Final  . ALT 01/16/2016 11* 14 - 54 U/L Final  . Alkaline Phosphatase 01/16/2016 90  38 - 126 U/L Final  . Total Bilirubin 01/16/2016 0.7  0.3 - 1.2 mg/dL Final  . GFR calc non Af Amer 01/16/2016 >60  >60 mL/min Final  . GFR calc Af Amer 01/16/2016 >60  >60 mL/min Final   Comment: (NOTE) The eGFR has been calculated using the CKD EPI equation. This calculation has not been validated in all clinical situations. eGFR's persistently <60 mL/min signify possible Chronic Kidney Disease.   . Anion gap 01/16/2016 6  5 - 15 Final     STUDIES: Nm Pet Image Restag (ps) Skull Base To Thigh  01/07/2016  CLINICAL DATA:  Subsequent treatment strategy for nasal cavity and sinus cancer. EXAM: NUCLEAR MEDICINE PET SKULL BASE TO THIGH TECHNIQUE: 12.12 mCi F-18 FDG was injected intravenously. Full-ring PET imaging was performed from the skull base to thigh after the radiotracer. CT data was obtained and used for attenuation correction and anatomic localization. FASTING BLOOD GLUCOSE:  Value: 79 mg/dl COMPARISON:  PET-CT dated 07/05/2015. Lung cancer screening CT dated 11/12/2015. FINDINGS: NECK No hypermetabolic lymph nodes  in the neck. Prior hypermetabolic lesion in the right nasal cavity/ethmoid sinus is no longer visualized. CHEST No suspicious pulmonary nodules. Specifically, the posterior right lower lobe nodular opacity noted on recent lung cancer screening CT is no longer visualized, likely infectious/inflammatory. Stable calcified granuloma in the right lower lobe (series 4/ image 92). No focal consolidation. No pleural effusion or pneumothorax. The heart is normal in size. No pericardial effusion. Atherosclerotic calcifications of the aortic arch. No hypermetabolic thoracic lymphadenopathy. ABDOMEN/PELVIS No abnormal hypermetabolic activity within the liver, pancreas, adrenal glands, or spleen. Mild atherosclerotic calcifications the abdominal aorta and branch vessels. Status post hysterectomy. Small fat containing right inguinal hernia. No hypermetabolic lymph nodes in the abdomen or pelvis. SKELETON No focal hypermetabolic activity to suggest skeletal metastasis. IMPRESSION: Prior hypermetabolic lesion in the right nasal cavity/ethmoid sinus is no longer visualized. Right lower lobe nodular opacity noted on recent lung cancer screening CT is no longer visualized, likely infectious/inflammatory. No evidence of recurrent or metastatic disease. Electronically Signed   By: Julian Hy M.D.   On: 01/07/2016 12:23    ASSESSMENT:  1.  Right sino  Nasal carcinoma.  Adenocarcinoma with lymphovascular invasion\  PET scan has been reviewed independently and shows no evidence of any recurrent or progressive disease.  (March of 2017)   post-radiation therapy. On clinical ground there is no evidence of recurrent or progressive disease Side effect of radiation with dry mouth Salagen has been prescribed Salagen is helping patient. Dry mouth is getting better.  2.  Small pulmonary nodule needs to be followed. Patient had a PET scan which also looked at left pulmonary nodule and PET scan has been evaluated independently  pulmonary nodule is not visible.  3. continues to smoke. Patient has been counseled regarding smoking. The patient was advised to get regular CT screening test done every January for early detection of lung cancer because of patient's history OF smoking.  Patient has been advised to call us to arrange that. Patient will be evaluated in six-month. All lab data has been reviewed. TSH in now February of 2017 was 2.15 and will be rechecked in six-month   T1NOMO Stage I cancer   Cancer of the right nasal cavity   Patient expressed understanding and was in agreement with this plan. She also understands that She can call clinic at any time with any questions, concerns, or complaints.    No matching staging information was found for the patient.  Forest Gleason, MD   01/16/2016 9:50 AM

## 2016-01-16 NOTE — Progress Notes (Signed)
Patient has an area on her left ear that has been there for the past couple of months.  It has become progressively larger and is now tender to touch. Appetite decreased.  Cannot eat meat.  Does well with vegetables and fruits. Otherwise, no complaints.

## 2016-02-27 DIAGNOSIS — J340 Abscess, furuncle and carbuncle of nose: Secondary | ICD-10-CM | POA: Diagnosis not present

## 2016-02-27 DIAGNOSIS — B379 Candidiasis, unspecified: Secondary | ICD-10-CM | POA: Diagnosis not present

## 2016-02-27 DIAGNOSIS — C300 Malignant neoplasm of nasal cavity: Secondary | ICD-10-CM | POA: Diagnosis not present

## 2016-03-19 DIAGNOSIS — C300 Malignant neoplasm of nasal cavity: Secondary | ICD-10-CM | POA: Diagnosis not present

## 2016-03-19 DIAGNOSIS — B379 Candidiasis, unspecified: Secondary | ICD-10-CM | POA: Diagnosis not present

## 2016-04-24 ENCOUNTER — Ambulatory Visit: Payer: Medicare PPO | Admitting: Radiation Oncology

## 2016-04-24 ENCOUNTER — Inpatient Hospital Stay: Admission: RE | Admit: 2016-04-24 | Payer: Medicare PPO | Source: Ambulatory Visit | Admitting: Radiation Oncology

## 2016-04-27 DIAGNOSIS — M25522 Pain in left elbow: Secondary | ICD-10-CM | POA: Diagnosis not present

## 2016-05-13 ENCOUNTER — Other Ambulatory Visit: Payer: Self-pay | Admitting: Orthopedic Surgery

## 2016-05-13 DIAGNOSIS — M25522 Pain in left elbow: Secondary | ICD-10-CM

## 2016-05-14 ENCOUNTER — Ambulatory Visit
Admission: RE | Admit: 2016-05-14 | Discharge: 2016-05-14 | Disposition: A | Discharge status: Home / Self Care | Payer: Medicare PPO | Source: Ambulatory Visit | Attending: Orthopedic Surgery | Admitting: Orthopedic Surgery

## 2016-05-14 DIAGNOSIS — M25522 Pain in left elbow: Secondary | ICD-10-CM | POA: Diagnosis not present

## 2016-05-14 DIAGNOSIS — S52133A Displaced fracture of neck of unspecified radius, initial encounter for closed fracture: Secondary | ICD-10-CM | POA: Diagnosis not present

## 2016-05-14 DIAGNOSIS — R601 Generalized edema: Secondary | ICD-10-CM | POA: Diagnosis not present

## 2016-05-14 DIAGNOSIS — S52131A Displaced fracture of neck of right radius, initial encounter for closed fracture: Secondary | ICD-10-CM | POA: Diagnosis not present

## 2016-07-14 DIAGNOSIS — D481 Neoplasm of uncertain behavior of connective and other soft tissue: Secondary | ICD-10-CM | POA: Diagnosis not present

## 2016-07-14 DIAGNOSIS — C300 Malignant neoplasm of nasal cavity: Secondary | ICD-10-CM | POA: Diagnosis not present

## 2016-07-14 DIAGNOSIS — J0140 Acute pansinusitis, unspecified: Secondary | ICD-10-CM | POA: Diagnosis not present

## 2016-07-16 ENCOUNTER — Ambulatory Visit (INDEPENDENT_AMBULATORY_CARE_PROVIDER_SITE_OTHER): Payer: Medicare PPO

## 2016-07-16 DIAGNOSIS — Z23 Encounter for immunization: Secondary | ICD-10-CM

## 2016-07-16 NOTE — Progress Notes (Signed)
Patient came in for Flu shot and requested a pneumo vaccine.  Patient had Pneumo23 5 years ago and is requesting the [facility].  This isn ot normally given to patient's under 65, and discussed with Dr. Caryl Bis for rationale for this patient to receive the vaccine.  Due to history of pneumonia and recent nasal caricoma, after research Dr. Caryl Bis agreed to authorize Prevnar 13 administeration.  Gave in Right deltoid. Patient tolerated well. thanks

## 2016-07-20 ENCOUNTER — Other Ambulatory Visit: Payer: Medicare PPO

## 2016-07-20 ENCOUNTER — Ambulatory Visit: Payer: Medicare PPO | Admitting: Hematology and Oncology

## 2016-07-20 ENCOUNTER — Ambulatory Visit: Payer: Self-pay | Admitting: Radiation Oncology

## 2016-07-20 DIAGNOSIS — G5601 Carpal tunnel syndrome, right upper limb: Secondary | ICD-10-CM | POA: Diagnosis not present

## 2016-07-22 NOTE — Progress Notes (Signed)
I have reviewed the above and agree. Patient with recent history of cancer placing her at risk for pneumonia. Prevnar was given.  Tommi Rumps, M.D.

## 2016-07-26 NOTE — Progress Notes (Signed)
[facility] day:  07/27/2016  Chief Complaint: Heather Beasley is a 60 y.o. female with stage I right nasal cavity adenocarcinoma who is seen for reassessment.  HPI:  The patient presented with recurrent sinus infections for 3 months.  She was referred to Dr. Tami Ribas of ENT.  In office endoscopy revealed a tumor.   Head MRI on 06/18/2015 revealed a polypoid 3 cm obstructing right nasal cavity mass.  PET scan on 07/05/2015 revealed a hypermetabolic lesion in the RIGHT ethmoid sinus consistent with biopsy-proven adenocarcinoma.  There was no evidence of local nodal metastasis in the neck.  There was a 1 mm nodule in the left upper lobe.  There was no evidence distant metastasis.  She underwent incisional biopsy of a polypoid mass in the right nasal cavity and paranasal sinuses on 06/25/2015.  Pathology revealed a 4 cm adenocarcinoma with lymphovascular invasion. There was no perineural invasion. Pathologic stage was T1NxMx.  She received radiation therapy to the involved field and lymph node per Dr. Baruch Gouty from 06/2015 - 09/2015.  PET scan on 01/07/2016 revealed resolution of prior hypermetabolic lesion in the right nasal cavity/ethmoid sinus. The right lower lobe nodular opacity on screening CT scan was no longer visualized.  There was no evidence of metastatic disease.  The patient was last seen in the medical oncology clinic by Dr. Oliva Bustard on 01/16/2016.  At that time, she complained of dry mouth and thigh redness with purulent drainage.  Exam was normal.  Labs were normal.  Salagen was prescribed for dry mouth.  She was counseled regarding smoking cessation.  She was encouraged to be followed by low dose chest CT scanning.  ENT evaluation revealed no evidence of recurrent disease.  Scoped last week was good.  She is now seen every 6 months.  She notes dry mouth and thrush.  She is on fluconazole. She is taking salagen 4pills/day (copay $152), which  helps some.  One hour after taking, her mouth is mouth dry.  She is smoking 1/3 pack per day.   Past Medical History:  Diagnosis Date  . Allergic rhinitis   . Arthritis   . Asthma   . Cancer ([place])    melanoma  . Cancer of nasal cavity and sinus ([place]) 07/07/2015   radiation  . Cervical spondylosis   . Colitis   . Colon polyps   . Depression   . Diverticulosis   . GERD (gastroesophageal reflux disease)   . Glaucoma   . Headaches, cluster   . Hyperlipidemia   . Hypertension   . IBS (irritable bowel syndrome)   . Kidney stones   . Neuropathy ([place])   . PONV (postoperative nausea and vomiting)   . Tobacco abuse     Past Surgical History:  Procedure Laterality Date  . ABDOMINAL HYSTERECTOMY  1998  . APPENDECTOMY  1998  . ARM FX REPAIR  2001  . BACK SURGERY    . CERVICAL FUSION  2010  . CHOLECYSTECTOMY  08/2011   Dr Rochel Brome  . COLONOSCOPY     Dr Tiffany Kocher  . CYSTOSCOPY     Dr. Bernardo Heater  . DILATION AND CURETTAGE OF UTERUS    . ESOPHAGOGASTRODUODENOSCOPY  2012   Dr Tiffany Kocher  . FRACTURE SURGERY  2001   plate in right wrist and arm  . IMAGE GUIDED SINUS SURGERY N/A 06/25/2015   Procedure: IMAGE GUIDED SINUS SURGERY;  Surgeon: Beverly Gust, MD;  Location: [facility] ORS;  Service: ENT;  Laterality: N/A;  . LUMBAR SPINE SURGERY  07/2004  . POLYPECTOMY N/A 06/25/2015   Procedure: POLYPECTOMY NASAL;  Surgeon: Beverly Gust, MD;  Location: [facility] ORS;  Service: ENT;  Laterality: N/A;  . [place]  . VAGINAL DELIVERY     x2    Family History  Problem Relation Age of Onset  . Hypertension Mother   . Hyperlipidemia Mother   . Diabetes Mother   . Colon cancer Father   . Pancreatic cancer Father   . Cancer Father     Colon & Pancreatic  . Breast cancer Paternal Grandmother 54  . Diabetes Paternal Grandmother   . Coronary artery disease Paternal Grandmother   . Heart failure Paternal Grandmother   . Cancer Paternal Grandmother     breast  . Stroke Brother      Social History:  reports that she has been smoking Cigarettes.  She has a 19.50 pack-year smoking history. She has never used smokeless tobacco. She reports that she does not drink alcohol or use drugs.  She smoked "40 some years".  Previously 1 1/2 pack a day.  She currently smokes 1/3 pack a day.  The patient is alone today.  Allergies:  Allergies  Allergen Reactions  . Albumin (Human) Rash  . Amoxicillin-Pot Clavulanate Hives  . Gabapentin Shortness Of Breath  . Latex Dermatitis and Rash  . Betadine [Povidone Iodine] Dermatitis, Rash and Other (See Comments)    Skin burning Topical betadine and iodine have this reaction with patient.  IV contrast is NOT a problem.  jkl  . Sucralfate     Other reaction(s): Other (See Comments) Abdominal Pain  . Amoxicillin     REACTION: rash  . Chantix [Varenicline Tartrate]     Heart racing  . Erythromycin     REACTION: rash  . Sucralfate Nausea And Vomiting    Heart racing Lightheaded     Current Medications: Current Outpatient Prescriptions  Medication Sig Dispense Refill  . albuterol (PROVENTIL HFA;VENTOLIN HFA) 108 (90 BASE) MCG/ACT inhaler Inhale 2 puffs into the lungs every 6 (six) hours as needed. 18 g 2  . aspirin EC 81 MG tablet Take 81 mg by mouth daily.    . cetirizine (ZYRTEC) 10 MG tablet Take 10 mg by mouth daily. Allergies.    . ciprofloxacin (CIPRO) 500 MG tablet     . cyclobenzaprine (FLEXERIL) 10 MG tablet Take 1 tablet (10 mg total) by mouth 3 (three) times daily. 90 tablet 6  . fluconazole (DIFLUCAN) 200 MG tablet     . loperamide (IMODIUM A-D) 2 MG tablet Take 2 mg by mouth 4 (four) times daily as needed for diarrhea or loose stools.    . Multiple Vitamins-Minerals (CENTRUM ADULTS PO) Take 1 tablet by mouth.    . neomycin-polymyxin b-dexamethasone (MAXITROL) 3.5-10000-0.1 SUSP     . ondansetron (ZOFRAN) 4 MG tablet Take 1 tablet (4 mg total) by mouth every 6 (six) hours as needed for nausea or vomiting. 40 tablet 1   . pantoprazole (PROTONIX) 40 MG tablet Take 1 tablet (40 mg total) by mouth 2 (two) times daily. 60 tablet 11  . pilocarpine (SALAGEN) 5 MG tablet Take 2 tablets (10 mg total) by mouth 3 (three) times daily before meals. 180 tablet 11   No current facility-administered medications for this visit.     Review of Systems:  GENERAL:  Feels good. No fevers, sweats or weight loss. PERFORMANCE STATUS (ECOG):  1 HEENT:  Dry mouth.  Ritta Slot  on fluconazole.  No visual changes, runny nose, sore throat, mouth sores or tenderness. Lungs: No shortness of breath or cough.  No hemoptysis. Cardiac:  No chest pain, palpitations, orthopnea, or PND. GI:  No nausea, vomiting, diarrhea, constipation, melena or hematochezia. GU:  No urgency, frequency, dysuria, or hematuria. Musculoskeletal:  No back pain.  No joint pain.  No muscle tenderness. Extremities:  No pain or swelling. Skin:  No rashes or skin changes. Neuro:  No headache, numbness or weakness, balance or coordination issues. Endocrine:  No diabetes, thyroid issues, hot flashes or night sweats. Psych:  No mood changes, depression or anxiety. Pain:  No focal pain. Review of systems:  All other systems reviewed and found to be negative.  Physical Exam: Blood pressure 129/83, pulse 71, temperature (!) 94.4 F (34.7 C), temperature source Tympanic, resp. rate 18, weight 135 lb 12.9 oz (61.6 kg). GENERAL:  Well developed, well nourished, woman sitting comfortably in the exam room in no acute distress. MENTAL STATUS:  Alert and oriented to person, place and time. HEAD:  Wearing a cap.  Short brown hair.  Normocephalic, atraumatic, face symmetric, no Cushingoid features. EYES:  Glasses.  Blue eyes.  Pupils equal round and reactive to light and accomodation.  No conjunctivitis or scleral icterus. ENT:  Oropharynx clear without lesion.  Tongue normal. Mucous membranes moist.  RESPIRATORY:  Clear to auscultation without rales, wheezes or  rhonchi. CARDIOVASCULAR:  Regular rate and rhythm without murmur, rub or gallop. ABDOMEN:  Soft, non-tender, with active bowel sounds, and no hepatosplenomegaly.  No masses. SKIN:  Tan.  Bites nails.  No rashes, ulcers or lesions. EXTREMITIES: No edema, no skin discoloration or tenderness.  No palpable cords. LYMPH NODES: No palpable cervical, supraclavicular, axillary or inguinal adenopathy  NEUROLOGICAL: Unremarkable. PSYCH:  Appropriate.  Appointment on 07/27/2016  Component Date Value Ref Range Status  . WBC 07/27/2016 3.1* 3.6 - 11.0 K/uL Final  . RBC 07/27/2016 4.41  3.80 - 5.20 MIL/uL Final  . Hemoglobin 07/27/2016 14.5  12.0 - 16.0 g/dL Final  . HCT 07/27/2016 42.4  35.0 - 47.0 % Final  . MCV 07/27/2016 96.3  80.0 - 100.0 fL Final  . MCH 07/27/2016 32.8  26.0 - 34.0 pg Final  . MCHC 07/27/2016 34.1  32.0 - 36.0 g/dL Final  . RDW 07/27/2016 13.2  11.5 - 14.5 % Final  . Platelets 07/27/2016 208  150 - 440 K/uL Final  . Neutrophils Relative % 07/27/2016 59  % Final  . Neutro Abs 07/27/2016 1.8  1.4 - 6.5 K/uL Final  . Lymphocytes Relative 07/27/2016 29  % Final  . Lymphs Abs 07/27/2016 0.9* 1.0 - 3.6 K/uL Final  . Monocytes Relative 07/27/2016 9  % Final  . Monocytes Absolute 07/27/2016 0.3  0.2 - 0.9 K/uL Final  . Eosinophils Relative 07/27/2016 3  % Final  . Eosinophils Absolute 07/27/2016 0.1  0 - 0.7 K/uL Final  . Basophils Relative 07/27/2016 0  % Final  . Basophils Absolute 07/27/2016 0.0  0 - 0.1 K/uL Final  . Sodium 07/27/2016 140  135 - 145 mmol/L Final  . Potassium 07/27/2016 4.6  3.5 - 5.1 mmol/L Final  . Chloride 07/27/2016 104  101 - 111 mmol/L Final  . CO2 07/27/2016 30  22 - 32 mmol/L Final  . Glucose, Bld 07/27/2016 113* 65 - 99 mg/dL Final  . BUN 07/27/2016 10  6 - 20 mg/dL Final  . Creatinine, Ser 07/27/2016 0.86  0.44 - 1.00 mg/dL Final  .  Calcium 07/27/2016 9.6  8.9 - 10.3 mg/dL Final  . Total Protein 07/27/2016 7.1  6.5 - 8.1 g/dL Final  . Albumin  07/27/2016 4.2  3.5 - 5.0 g/dL Final  . AST 07/27/2016 23  15 - 41 U/L Final  . ALT 07/27/2016 11* 14 - 54 U/L Final  . Alkaline Phosphatase 07/27/2016 111  38 - 126 U/L Final  . Total Bilirubin 07/27/2016 0.8  0.3 - 1.2 mg/dL Final  . GFR calc non Af Amer 07/27/2016 >60  >60 mL/min Final  . GFR calc Af Amer 07/27/2016 >60  >60 mL/min Final   Comment: (NOTE) The eGFR has been calculated using the CKD EPI equation. This calculation has not been validated in all clinical situations. eGFR's persistently <60 mL/min signify possible Chronic Kidney Disease.   Georgiann Hahn gap 07/27/2016 6  5 - 15 Final    Assessment:  NIKEYA MAXIM is a 60 y.o. female with stage I right nasal cavity adenocarcinoma s/p  incisional biopsy of a polypoid mass in the right nasal cavity and paranasal sinuses on 06/25/2015.  Pathology revealed a 4 cm adenocarcinoma with lymphovascular invasion. There was no perineural invasion. Pathologic stage was T1NxMx.  Head MRI on 06/18/2015 revealed a polypoid 3 cm obstructing right nasal cavity mass.  PET scan on 07/05/2015 revealed a hypermetabolic lesion in the RIGHT ethmoid sinus consistent with biopsy-proven adenocarcinoma.  There was no evidence of local nodal metastasis in the neck.  There was a 1 mm nodule in the left upper lobe.  There was no evidence distant metastasis.  She received radiation therapy to the involved field and lymph node from 06/2015 - 09/2015.  PET scan on 01/07/2016 revealed resolution of prior hypermetabolic lesion in the right nasal cavity/ethmoid sinus. The right lower lobe nodular opacity on screening CT scan was no longer visualized.  There was no evidence of metastatic disease.  Symptomatically, she notes xerostomia. She has issues with thrush.  She is on fluconazole.  She is smoking.  Plan: 1.  Discuss entire medical history, diagnosis and management of nasal cavity carcinoma and planned follow-up. 2.  Labs today:  CBC with diff, CMP, TSH, free  T4. 3.  Discuss management of xerostomia.  Consider SalivaMax. 4.  Discuss low dose screening CT program.  Last imaging 10/2015. 5.  Encourage smoking cessation. 6.  Rx to [facility] for SalivaMax. 7.  Continue close monitoring with ENT (every 6 months per patient). 8.  Obtain radiation summary. 9.  RTC in 3 months for MD assessment.   Lequita Asal, MD  07/27/2016, 10:31 AM

## 2016-07-27 ENCOUNTER — Inpatient Hospital Stay (HOSPITAL_BASED_OUTPATIENT_CLINIC_OR_DEPARTMENT_OTHER): Payer: Medicare PPO

## 2016-07-27 ENCOUNTER — Encounter: Payer: Self-pay | Admitting: Hematology and Oncology

## 2016-07-27 ENCOUNTER — Ambulatory Visit
Admission: RE | Admit: 2016-07-27 | Discharge: 2016-07-27 | Disposition: A | Discharge status: Home / Self Care | Payer: Medicare PPO | Source: Ambulatory Visit | Attending: Radiation Oncology | Admitting: Radiation Oncology

## 2016-07-27 ENCOUNTER — Inpatient Hospital Stay: Payer: Medicare PPO | Attending: Hematology and Oncology | Admitting: Hematology and Oncology

## 2016-07-27 VITALS — BP 129/83 | HR 71 | Temp 94.4°F | Resp 18 | Wt 135.8 lb

## 2016-07-27 DIAGNOSIS — K117 Disturbances of salivary secretion: Secondary | ICD-10-CM | POA: Diagnosis not present

## 2016-07-27 DIAGNOSIS — B370 Candidal stomatitis: Secondary | ICD-10-CM | POA: Insufficient documentation

## 2016-07-27 DIAGNOSIS — C300 Malignant neoplasm of nasal cavity: Secondary | ICD-10-CM

## 2016-07-27 DIAGNOSIS — Z79899 Other long term (current) drug therapy: Secondary | ICD-10-CM | POA: Insufficient documentation

## 2016-07-27 DIAGNOSIS — Z800 Family history of malignant neoplasm of digestive organs: Secondary | ICD-10-CM | POA: Diagnosis not present

## 2016-07-27 DIAGNOSIS — Z86010 Personal history of colonic polyps: Secondary | ICD-10-CM | POA: Diagnosis not present

## 2016-07-27 DIAGNOSIS — Z8522 Personal history of malignant neoplasm of nasal cavities, middle ear, and accessory sinuses: Secondary | ICD-10-CM | POA: Insufficient documentation

## 2016-07-27 DIAGNOSIS — C8522 Mediastinal (thymic) large B-cell lymphoma, intrathoracic lymph nodes: Secondary | ICD-10-CM | POA: Diagnosis not present

## 2016-07-27 DIAGNOSIS — K5790 Diverticulosis of intestine, part unspecified, without perforation or abscess without bleeding: Secondary | ICD-10-CM | POA: Insufficient documentation

## 2016-07-27 DIAGNOSIS — F329 Major depressive disorder, single episode, unspecified: Secondary | ICD-10-CM | POA: Diagnosis not present

## 2016-07-27 DIAGNOSIS — B379 Candidiasis, unspecified: Secondary | ICD-10-CM | POA: Diagnosis not present

## 2016-07-27 DIAGNOSIS — Z923 Personal history of irradiation: Secondary | ICD-10-CM | POA: Diagnosis not present

## 2016-07-27 DIAGNOSIS — R682 Dry mouth, unspecified: Secondary | ICD-10-CM

## 2016-07-27 DIAGNOSIS — Z87891 Personal history of nicotine dependence: Secondary | ICD-10-CM

## 2016-07-27 DIAGNOSIS — J45909 Unspecified asthma, uncomplicated: Secondary | ICD-10-CM | POA: Diagnosis not present

## 2016-07-27 DIAGNOSIS — F17210 Nicotine dependence, cigarettes, uncomplicated: Secondary | ICD-10-CM | POA: Diagnosis not present

## 2016-07-27 DIAGNOSIS — Z85828 Personal history of other malignant neoplasm of skin: Secondary | ICD-10-CM | POA: Diagnosis not present

## 2016-07-27 DIAGNOSIS — M479 Spondylosis, unspecified: Secondary | ICD-10-CM

## 2016-07-27 DIAGNOSIS — K589 Irritable bowel syndrome without diarrhea: Secondary | ICD-10-CM | POA: Diagnosis not present

## 2016-07-27 DIAGNOSIS — Z87442 Personal history of urinary calculi: Secondary | ICD-10-CM | POA: Diagnosis not present

## 2016-07-27 DIAGNOSIS — G629 Polyneuropathy, unspecified: Secondary | ICD-10-CM

## 2016-07-27 DIAGNOSIS — K219 Gastro-esophageal reflux disease without esophagitis: Secondary | ICD-10-CM | POA: Diagnosis not present

## 2016-07-27 DIAGNOSIS — Y842 Radiological procedure and radiotherapy as the cause of abnormal reaction of the patient, or of later complication, without mention of misadventure at the time of the procedure: Secondary | ICD-10-CM

## 2016-07-27 DIAGNOSIS — Z9221 Personal history of antineoplastic chemotherapy: Secondary | ICD-10-CM | POA: Diagnosis not present

## 2016-07-27 DIAGNOSIS — E785 Hyperlipidemia, unspecified: Secondary | ICD-10-CM

## 2016-07-27 DIAGNOSIS — Z7982 Long term (current) use of aspirin: Secondary | ICD-10-CM | POA: Insufficient documentation

## 2016-07-27 DIAGNOSIS — Z803 Family history of malignant neoplasm of breast: Secondary | ICD-10-CM | POA: Insufficient documentation

## 2016-07-27 DIAGNOSIS — R918 Other nonspecific abnormal finding of lung field: Secondary | ICD-10-CM | POA: Diagnosis not present

## 2016-07-27 DIAGNOSIS — I10 Essential (primary) hypertension: Secondary | ICD-10-CM | POA: Diagnosis not present

## 2016-07-27 DIAGNOSIS — C319 Malignant neoplasm of accessory sinus, unspecified: Secondary | ICD-10-CM

## 2016-07-27 DIAGNOSIS — M129 Arthropathy, unspecified: Secondary | ICD-10-CM | POA: Diagnosis not present

## 2016-07-27 LAB — CBC WITH DIFFERENTIAL/PLATELET
BASOS ABS: 0 10*3/uL (ref 0–0.1)
Basophils Relative: 0 %
EOS ABS: 0.1 10*3/uL (ref 0–0.7)
EOS PCT: 3 %
HCT: 42.4 % (ref 35.0–47.0)
Hemoglobin: 14.5 g/dL (ref 12.0–16.0)
LYMPHS ABS: 0.9 10*3/uL — AB (ref 1.0–3.6)
Lymphocytes Relative: 29 %
MCH: 32.8 pg (ref 26.0–34.0)
MCHC: 34.1 g/dL (ref 32.0–36.0)
MCV: 96.3 fL (ref 80.0–100.0)
Monocytes Absolute: 0.3 10*3/uL (ref 0.2–0.9)
Monocytes Relative: 9 %
Neutro Abs: 1.8 10*3/uL (ref 1.4–6.5)
Neutrophils Relative %: 59 %
PLATELETS: 208 10*3/uL (ref 150–440)
RBC: 4.41 MIL/uL (ref 3.80–5.20)
RDW: 13.2 % (ref 11.5–14.5)
WBC: 3.1 10*3/uL — AB (ref 3.6–11.0)

## 2016-07-27 LAB — COMPREHENSIVE METABOLIC PANEL
ALT: 11 U/L — AB (ref 14–54)
AST: 23 U/L (ref 15–41)
Albumin: 4.2 g/dL (ref 3.5–5.0)
Alkaline Phosphatase: 111 U/L (ref 38–126)
Anion gap: 6 (ref 5–15)
BUN: 10 mg/dL (ref 6–20)
CALCIUM: 9.6 mg/dL (ref 8.9–10.3)
CO2: 30 mmol/L (ref 22–32)
Chloride: 104 mmol/L (ref 101–111)
Creatinine, Ser: 0.86 mg/dL (ref 0.44–1.00)
GFR calc non Af Amer: >60 mL/min (ref >60)
GLUCOSE: 113 mg/dL — AB (ref 65–99)
POTASSIUM: 4.6 mmol/L (ref 3.5–5.1)
SODIUM: 140 mmol/L (ref 135–145)
Total Bilirubin: 0.8 mg/dL (ref 0.3–1.2)
Total Protein: 7.1 g/dL (ref 6.5–8.1)

## 2016-07-27 LAB — TSH: TSH: 3.528 u[IU]/mL (ref 0.350–4.500)

## 2016-07-27 NOTE — Progress Notes (Signed)
Radiation Oncology Follow up Note  Name: Heather Beasley   Date:   07/27/2016 MRN:  [id number] DOB: 1956-01-20    This 60 y.o. 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 She is seen today in routine follow-up is doing fairly well she is currently under treatment for oral candidiasis on her fourth day of Diflucan. She specifically denies head and neck pain dysphasia. PET CT scan performed in March 2017 showed no evidence of disease.  COMPLICATIONS OF TREATMENT: none  FOLLOW UP COMPLIANCE: keeps appointments   PHYSICAL EXAM:  There were no vitals taken for this visit. Patient still has significant oral candidiasis. I have suggested she continues on Diflucan for 2 weeks. Otherwise oral cavity is clear no oral mucosal lesions are identified. Indirect mirror examination shows upper airway clear vallecula and base of tongue within normal limits. Neck is clear without evidence of subject gastric cervical or supraclavicular adenopathy. Well-developed well-nourished patient in NAD. HEENT reveals PERLA, EOMI, discs not visualized.  Oral cavity is clear. No oral mucosal lesions are identified. Neck is clear without evidence of cervical or supraclavicular adenopathy. Lungs are clear to A&P. Cardiac examination is essentially unremarkable with regular rate and rhythm without murmur rub or thrill. Abdomen is benign with no organomegaly or masses noted. Motor sensory and DTR levels are equal and symmetric in the upper and lower extremities. Cranial nerves II through XII are grossly intact. Proprioception is intact. No peripheral adenopathy or edema is identified. No motor or sensory levels  are noted. Crude visual fields are within normal range.  RADIOLOGY RESULTS: Last PET CT scan is reviewed   PLAN: The present time patient is doing well with no evidence of disease. She'll continue for full 2 weeks on her Diflucan to try to eradicate oral candidiasis. She continues close follow-up care with ENT. Is also seeing medical oncology. I have asked to see her back in 6 months for follow-up and then will go to once your visits. Patient knows to call with any concerns.  I would like to take this opportunity to thank you for allowing me to participate in the care of your patient.Armstead Peaks., MD

## 2016-07-27 NOTE — Progress Notes (Signed)
Patient states she has neuropathy pain in arms and left leg.  Also unable to eat certain foods like potatoes, bread. Patient is currently on Cipro and for a sinus infection.

## 2016-07-28 LAB — T4: T4 TOTAL: 6.9 ug/dL (ref 4.5–12.0)

## 2016-08-28 DIAGNOSIS — M25529 Pain in unspecified elbow: Secondary | ICD-10-CM | POA: Insufficient documentation

## 2016-08-28 DIAGNOSIS — M25521 Pain in right elbow: Secondary | ICD-10-CM | POA: Diagnosis not present

## 2016-09-21 DIAGNOSIS — C300 Malignant neoplasm of nasal cavity: Secondary | ICD-10-CM | POA: Diagnosis not present

## 2016-09-21 DIAGNOSIS — D481 Neoplasm of uncertain behavior of connective and other soft tissue: Secondary | ICD-10-CM | POA: Diagnosis not present

## 2016-09-24 DIAGNOSIS — M7701 Medial epicondylitis, right elbow: Secondary | ICD-10-CM | POA: Diagnosis not present

## 2016-09-24 DIAGNOSIS — M25521 Pain in right elbow: Secondary | ICD-10-CM | POA: Diagnosis not present

## 2016-09-24 DIAGNOSIS — M7711 Lateral epicondylitis, right elbow: Secondary | ICD-10-CM | POA: Diagnosis not present

## 2016-09-28 ENCOUNTER — Ambulatory Visit (INDEPENDENT_AMBULATORY_CARE_PROVIDER_SITE_OTHER): Payer: Medicare PPO | Admitting: Family Medicine

## 2016-09-28 ENCOUNTER — Encounter: Payer: Self-pay | Admitting: Family Medicine

## 2016-09-28 DIAGNOSIS — J209 Acute bronchitis, unspecified: Secondary | ICD-10-CM | POA: Diagnosis not present

## 2016-09-28 MED ORDER — HYDROCOD POLST-CPM POLST ER 10-8 MG/5ML PO SUER: 5.0000 mL | Freq: Two times a day (BID) | ORAL | 0 refills | Status: DC | PRN

## 2016-09-28 MED ORDER — DOXYCYCLINE HYCLATE 100 MG PO TABS
100.0000 mg | ORAL_TABLET | Freq: Two times a day (BID) | ORAL | 0 refills | Status: DC
Start: 2016-09-28 — End: 2016-12-01

## 2016-09-28 NOTE — Assessment & Plan Note (Signed)
New problem.  Acute. ? Acute on chronic. Treating with Doxy and Tussionex.

## 2016-09-28 NOTE — Progress Notes (Signed)
Pre visit review using our clinic review tool, if applicable. No additional management support is needed unless otherwise documented below in the visit note. 

## 2016-09-28 NOTE — Patient Instructions (Signed)
Take the medications as prescribed.  Follow up next year.   Take care  Dr. Lacinda Axon

## 2016-09-28 NOTE — Progress Notes (Signed)
Subjective:  Patient ID: Heather Beasley, female    DOB: June 11, 1956  Age: 60 y.o. MRN: [id number]  CC: Cough, ST  HPI:  60 year old female with a past medical history of tobacco abuse, cancer the nasal cavity presents with the above complaints.   Cough, ST  One-week history of sore throat and cough.  Started with sore throat. Sore throat is now resolved.  She is now experiencing a productive cough. She's also having some runny nose.  She saw ENT and was told that this was viral. He was instructed to follow up and she has not done so.  She presents today for evaluation.  She continues to have severe cough which is interfering with sleep.  She is using Delsym and Mucinex without improvement.  She reports associated shortness of breath.  No fever or chills.  No known exacerbating factors.  No other issues at this time.  Social Hx   Social History   Social History  . Marital status: Married    Spouse name: N/A  . Number of children: 2  . Years of education: N/A   Occupational History  . Cashier    Social History Main Topics  . Smoking status: Current Every Day Smoker    Packs/day: 0.50    Years: 39.00    Types: Cigarettes  . Smokeless tobacco: Never Used     Comment: e cigs currently--trying to quit  . Alcohol use No  . Drug use: No  . Sexual activity: Not Currently    Partners: Male   Other Topics Concern  . None   Social History Narrative   Lives in [place] with husband. Dog in home. Work - disabled for neck and back pain.   Review of Systems  Constitutional: Negative for fever.  Respiratory: Positive for cough and shortness of breath.    Objective:  BP 117/75 (BP Location: Left Arm, Patient Position: Sitting, Cuff Size: Normal)   Pulse 80   Temp 98 F (36.7 C) (Oral)   Resp 16   Wt 139 lb 4 oz (63.2 kg)   SpO2 97%   BMI 22.48 kg/m   BP/Weight 09/28/2016 07/27/2016 [id number]  Systolic BP 123XX123 [id number]  Diastolic BP 75 83 82  Wt. (Lbs)  139.25 135.8 134.92  BMI 22.48 21.92 21.79   Physical Exam  Constitutional: She is oriented to person, place, and time. She appears well-developed. No distress.  HENT:  Mouth/Throat: Oropharynx is clear and moist.  Cardiovascular: Normal rate and regular rhythm.   Pulmonary/Chest: Effort normal and breath sounds normal. She has no wheezes. She has no rales.  Neurological: She is alert and oriented to person, place, and time.  Psychiatric: She has a normal mood and affect.  Vitals reviewed.  Lab Results  Component Value Date   WBC 3.1 (L) 07/27/2016   HGB 14.5 07/27/2016   HCT 42.4 07/27/2016   PLT 208 07/27/2016   GLUCOSE 113 (H) 07/27/2016   CHOL 147 11/29/2015   TRIG 105.0 11/29/2015   HDL 44.20 11/29/2015   LDLCALC 82 11/29/2015   ALT 11 (L) 07/27/2016   AST 23 07/27/2016   NA 140 07/27/2016   K 4.6 07/27/2016   CL 104 07/27/2016   CREATININE 0.86 07/27/2016   BUN 10 07/27/2016   CO2 30 07/27/2016   TSH 3.528 07/27/2016   INR 1.03 08/04/2013   MICROALBUR 0.1 07/04/2014    Assessment & Plan:   Problem List Items Addressed This Visit  Acute bronchitis    New problem.  Acute. ? Acute on chronic. Treating with Doxy and Tussionex.          Meds ordered this encounter  Medications  . doxycycline (VIBRA-TABS) 100 MG tablet    Sig: Take 1 tablet (100 mg total) by mouth 2 (two) times daily.    Dispense:  14 tablet    Refill:  0  . chlorpheniramine-HYDROcodone (TUSSIONEX PENNKINETIC ER) 10-8 MG/5ML SUER    Sig: Take 5 mLs by mouth every 12 (twelve) hours as needed.    Dispense:  115 mL    Refill:  0    Follow-up: PRN  [place]

## 2016-09-30 ENCOUNTER — Ambulatory Visit: Payer: Medicare PPO | Admitting: Family Medicine

## 2016-10-01 DIAGNOSIS — M25521 Pain in right elbow: Secondary | ICD-10-CM | POA: Diagnosis not present

## 2016-10-01 DIAGNOSIS — M25621 Stiffness of right elbow, not elsewhere classified: Secondary | ICD-10-CM | POA: Diagnosis not present

## 2016-10-04 DIAGNOSIS — K117 Disturbances of salivary secretion: Secondary | ICD-10-CM | POA: Insufficient documentation

## 2016-10-04 DIAGNOSIS — Y842 Radiological procedure and radiotherapy as the cause of abnormal reaction of the patient, or of later complication, without mention of misadventure at the time of the procedure: Secondary | ICD-10-CM

## 2016-10-06 DIAGNOSIS — M7711 Lateral epicondylitis, right elbow: Secondary | ICD-10-CM | POA: Diagnosis not present

## 2016-10-06 DIAGNOSIS — M7701 Medial epicondylitis, right elbow: Secondary | ICD-10-CM | POA: Diagnosis not present

## 2016-10-09 DIAGNOSIS — S52124D Nondisplaced fracture of head of right radius, subsequent encounter for closed fracture with routine healing: Secondary | ICD-10-CM | POA: Diagnosis not present

## 2016-10-09 DIAGNOSIS — M7521 Bicipital tendinitis, right shoulder: Secondary | ICD-10-CM | POA: Diagnosis not present

## 2016-10-19 IMAGING — CT NM PET TUM IMG RESTAG (PS) SKULL BASE T - THIGH
1 of 9 series · 1 of 25 positions shown · non-contrast
Comparison: PET-CT dated 07/05/2015. Lung cancer screening CT dated
11/12/2015.

CLINICAL DATA: Subsequent treatment strategy for nasal cavity and
sinus cancer.

EXAM:
NUCLEAR MEDICINE PET SKULL BASE TO THIGH
TECHNIQUE: 12.12 mCi F-18 FDG was injected intravenously. Full-ring PET imaging
was performed from the skull base to thigh after the radiotracer. CT
data was obtained and used for attenuation correction and anatomic
localization.
FASTING BLOOD GLUCOSE:  Value: 79 mg/dl

[Series 4: ct wb 5.0 b30f · axial · 5.0mm · 0.98mm/px · 1 of 290 slices shown]
[im 290/290  brain]
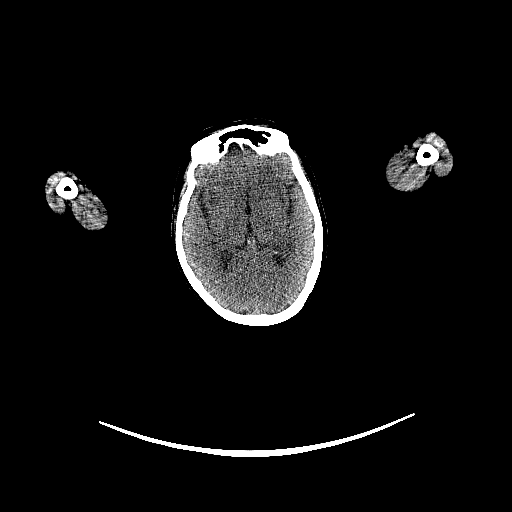

[1 of 25 positions shown; findings below may reference images not displayed]

FINDINGS: NECK

No hypermetabolic lymph nodes in the neck.

Prior hypermetabolic lesion in the right nasal cavity/ethmoid sinus
is no longer visualized.

CHEST

No suspicious pulmonary nodules. Specifically, the posterior right
lower lobe nodular opacity noted on recent lung cancer screening CT
is no longer visualized, likely infectious/inflammatory. Stable
calcified granuloma in the right lower lobe (series 4/ image 92). No
focal consolidation. No pleural effusion or pneumothorax.

The heart is normal in size. No pericardial effusion.
Atherosclerotic calcifications of the aortic arch. No hypermetabolic
thoracic lymphadenopathy.

ABDOMEN/PELVIS

No abnormal hypermetabolic activity within the liver, pancreas,
adrenal glands, or spleen.

Mild atherosclerotic calcifications the abdominal aorta and branch
vessels. Status post hysterectomy. Small fat containing right
inguinal hernia.

No hypermetabolic lymph nodes in the abdomen or pelvis.

SKELETON

No focal hypermetabolic activity to suggest skeletal metastasis.
IMPRESSION: Prior hypermetabolic lesion in the right nasal cavity/ethmoid sinus
is no longer visualized.

Right lower lobe nodular opacity noted on recent lung cancer
screening CT is no longer visualized, likely
infectious/inflammatory.

No evidence of recurrent or metastatic disease.

## 2016-11-03 ENCOUNTER — Inpatient Hospital Stay (HOSPITAL_BASED_OUTPATIENT_CLINIC_OR_DEPARTMENT_OTHER): Payer: Medicare PPO | Admitting: Hematology and Oncology

## 2016-11-03 ENCOUNTER — Inpatient Hospital Stay: Payer: Medicare PPO | Attending: Hematology and Oncology

## 2016-11-03 ENCOUNTER — Encounter: Payer: Self-pay | Admitting: Hematology and Oncology

## 2016-11-03 VITALS — BP 122/77 | HR 76 | Temp 95.3°F | Resp 18 | Wt 140.2 lb

## 2016-11-03 DIAGNOSIS — Z7982 Long term (current) use of aspirin: Secondary | ICD-10-CM | POA: Insufficient documentation

## 2016-11-03 DIAGNOSIS — Z86010 Personal history of colonic polyps: Secondary | ICD-10-CM | POA: Insufficient documentation

## 2016-11-03 DIAGNOSIS — Z800 Family history of malignant neoplasm of digestive organs: Secondary | ICD-10-CM | POA: Diagnosis not present

## 2016-11-03 DIAGNOSIS — Z79899 Other long term (current) drug therapy: Secondary | ICD-10-CM | POA: Diagnosis not present

## 2016-11-03 DIAGNOSIS — K219 Gastro-esophageal reflux disease without esophagitis: Secondary | ICD-10-CM

## 2016-11-03 DIAGNOSIS — Z923 Personal history of irradiation: Secondary | ICD-10-CM

## 2016-11-03 DIAGNOSIS — F17210 Nicotine dependence, cigarettes, uncomplicated: Secondary | ICD-10-CM | POA: Insufficient documentation

## 2016-11-03 DIAGNOSIS — M479 Spondylosis, unspecified: Secondary | ICD-10-CM | POA: Diagnosis not present

## 2016-11-03 DIAGNOSIS — I10 Essential (primary) hypertension: Secondary | ICD-10-CM

## 2016-11-03 DIAGNOSIS — G629 Polyneuropathy, unspecified: Secondary | ICD-10-CM | POA: Insufficient documentation

## 2016-11-03 DIAGNOSIS — K529 Noninfective gastroenteritis and colitis, unspecified: Secondary | ICD-10-CM

## 2016-11-03 DIAGNOSIS — K029 Dental caries, unspecified: Secondary | ICD-10-CM | POA: Diagnosis not present

## 2016-11-03 DIAGNOSIS — Z85820 Personal history of malignant melanoma of skin: Secondary | ICD-10-CM | POA: Diagnosis not present

## 2016-11-03 DIAGNOSIS — F329 Major depressive disorder, single episode, unspecified: Secondary | ICD-10-CM

## 2016-11-03 DIAGNOSIS — B379 Candidiasis, unspecified: Secondary | ICD-10-CM | POA: Diagnosis not present

## 2016-11-03 DIAGNOSIS — C300 Malignant neoplasm of nasal cavity: Secondary | ICD-10-CM

## 2016-11-03 DIAGNOSIS — M129 Arthropathy, unspecified: Secondary | ICD-10-CM | POA: Diagnosis not present

## 2016-11-03 DIAGNOSIS — J45909 Unspecified asthma, uncomplicated: Secondary | ICD-10-CM

## 2016-11-03 DIAGNOSIS — K5790 Diverticulosis of intestine, part unspecified, without perforation or abscess without bleeding: Secondary | ICD-10-CM | POA: Insufficient documentation

## 2016-11-03 DIAGNOSIS — E785 Hyperlipidemia, unspecified: Secondary | ICD-10-CM | POA: Diagnosis not present

## 2016-11-03 DIAGNOSIS — C319 Malignant neoplasm of accessory sinus, unspecified: Principal | ICD-10-CM

## 2016-11-03 DIAGNOSIS — Z87440 Personal history of urinary (tract) infections: Secondary | ICD-10-CM

## 2016-11-03 DIAGNOSIS — K117 Disturbances of salivary secretion: Secondary | ICD-10-CM

## 2016-11-03 DIAGNOSIS — Y842 Radiological procedure and radiotherapy as the cause of abnormal reaction of the patient, or of later complication, without mention of misadventure at the time of the procedure: Secondary | ICD-10-CM

## 2016-11-03 DIAGNOSIS — Z803 Family history of malignant neoplasm of breast: Secondary | ICD-10-CM | POA: Insufficient documentation

## 2016-11-03 DIAGNOSIS — K589 Irritable bowel syndrome without diarrhea: Secondary | ICD-10-CM | POA: Insufficient documentation

## 2016-11-03 NOTE — Progress Notes (Signed)
Patient offers no concerns today. 

## 2016-11-03 NOTE — Progress Notes (Signed)
[facility] day:  11/03/2016  Chief Complaint: Heather Beasley is a 61 y.o. female with stage I right nasal cavity adenocarcinoma who is seen for 3 month assessment.  HPI:  The patient was last seen in the medical oncology clinic on 07/27/2016.  At that time, she was seen for initial assessment by me.  She noted xerostomia. She had issues with thrush and was on fluconazole.  She was smoking.  A prescription for Salivamax was written.  We discussed smoking cessation.  The low dose screening CT program was discussed.  Labs at last visit included a WBC 3100 with a ALC of 900.  CMP was normal.  TSH and T4 were normal.  During the interim, she notes that her teeth are disintegrating.  Mouth is very dry.  She has had thrush.  There are insurance issues with SalivaMax.  She needs to have some dental work.  She is smoking 4 cigarettes/day.   Past Medical History:  Diagnosis Date  . Allergic rhinitis   . Arthritis   . Asthma   . Cancer ([place])    melanoma  . Cancer of nasal cavity and sinus ([place]) 07/07/2015   radiation  . Cervical spondylosis   . Colitis   . Colon polyps   . Depression   . Diverticulosis   . GERD (gastroesophageal reflux disease)   . Glaucoma   . Headaches, cluster   . Hyperlipidemia   . Hypertension   . IBS (irritable bowel syndrome)   . Kidney stones   . Neuropathy ([place])   . PONV (postoperative nausea and vomiting)   . Tobacco abuse     Past Surgical History:  Procedure Laterality Date  . ABDOMINAL HYSTERECTOMY  1998  . APPENDECTOMY  1998  . ARM FX REPAIR  2001  . BACK SURGERY    . CERVICAL FUSION  2010  . CHOLECYSTECTOMY  08/2011   Dr Rochel Brome  . COLONOSCOPY     Dr Tiffany Kocher  . CYSTOSCOPY     Dr. Bernardo Heater  . DILATION AND CURETTAGE OF UTERUS    . ESOPHAGOGASTRODUODENOSCOPY  2012   Dr Tiffany Kocher  . FRACTURE SURGERY  2001   plate in right wrist and arm  . IMAGE GUIDED SINUS SURGERY N/A 06/25/2015   Procedure: IMAGE  GUIDED SINUS SURGERY;  Surgeon: [place] Gust, MD;  Location: [facility] ORS;  Service: ENT;  Laterality: N/A;  . LUMBAR SPINE SURGERY  07/2004  . POLYPECTOMY N/A 06/25/2015   Procedure: POLYPECTOMY NASAL;  Surgeon: [place] Gust, MD;  Location: [facility] ORS;  Service: ENT;  Laterality: N/A;  . [place]  . VAGINAL DELIVERY     x2    Family History  Problem Relation Age of Onset  . Hypertension Mother   . Hyperlipidemia Mother   . Diabetes Mother   . Colon cancer Father   . Pancreatic cancer Father   . Cancer Father     Colon & Pancreatic  . Breast cancer Paternal Grandmother 37  . Diabetes Paternal Grandmother   . Coronary artery disease Paternal Grandmother   . Heart failure Paternal Grandmother   . Cancer Paternal Grandmother     breast  . Stroke Brother     Social History:  reports that she has been smoking Cigarettes.  She has a 19.50 pack-year smoking history. She has never used smokeless tobacco. She reports that she does not drink alcohol or use drugs.  She smoked "40 some years".  Previously 1 1/2 pack a day.  She currently smokes 4 cigarettes a day.  The patient is alone today.  Allergies:  Allergies  Allergen Reactions  . Albumin (Human) Rash  . Amoxicillin-Pot Clavulanate Hives  . Gabapentin Shortness Of Breath  . Latex Dermatitis and Rash  . Betadine [Povidone Iodine] Dermatitis, Rash and Other (See Comments)    Skin burning Topical betadine and iodine have this reaction with patient.  IV contrast is NOT a problem.  jkl  . Sucralfate     Other reaction(s): Other (See Comments) Abdominal Pain  . Amoxicillin     REACTION: rash  . Chantix [Varenicline Tartrate]     Heart racing  . Erythromycin     REACTION: rash  . Sucralfate Nausea And Vomiting    Heart racing Lightheaded     Current Medications: Current Outpatient Prescriptions  Medication Sig Dispense Refill  . albuterol (PROVENTIL HFA;VENTOLIN HFA) 108 (90 BASE) MCG/ACT inhaler Inhale 2 puffs  into the lungs every 6 (six) hours as needed. 18 g 2  . aspirin EC 81 MG tablet Take 81 mg by mouth daily.    . cetirizine (ZYRTEC) 10 MG tablet Take 10 mg by mouth daily. Allergies.    . cyclobenzaprine (FLEXERIL) 10 MG tablet Take 1 tablet (10 mg total) by mouth 3 (three) times daily. 90 tablet 6  . Multiple Vitamins-Minerals (CENTRUM ADULTS PO) Take 1 tablet by mouth.    . pantoprazole (PROTONIX) 40 MG tablet Take 1 tablet (40 mg total) by mouth 2 (two) times daily. 60 tablet 11  . Artificial Saliva (SALIVAMAX) PACK Dissolve 1 packet in 1 oz of water and rinse by mouth 4-8 times per day  98  . chlorpheniramine-HYDROcodone (TUSSIONEX PENNKINETIC ER) 10-8 MG/5ML SUER Take 5 mLs by mouth every 12 (twelve) hours as needed. (Patient not taking: Reported on 11/03/2016) 115 mL 0  . doxycycline (VIBRA-TABS) 100 MG tablet Take 1 tablet (100 mg total) by mouth 2 (two) times daily. (Patient not taking: Reported on 11/03/2016) 14 tablet 0  . fluconazole (DIFLUCAN) 200 MG tablet     . loperamide (IMODIUM A-D) 2 MG tablet Take 2 mg by mouth 4 (four) times daily as needed for diarrhea or loose stools.    Marland Kitchen neomycin-polymyxin b-dexamethasone (MAXITROL) 3.5-10000-0.1 SUSP     . ondansetron (ZOFRAN) 4 MG tablet Take 1 tablet (4 mg total) by mouth every 6 (six) hours as needed for nausea or vomiting. (Patient not taking: Reported on 11/03/2016) 40 tablet 1  . pilocarpine (SALAGEN) 5 MG tablet Take 2 tablets (10 mg total) by mouth 3 (three) times daily before meals. (Patient not taking: Reported on 11/03/2016) 180 tablet 11   No current facility-administered medications for this visit.     Review of Systems:  GENERAL:  Feels ok. No fevers or sweats.  Weight gain of 5 pounds. PERFORMANCE STATUS (ECOG):  1 HEENT:  Dry mouth.  Interval thrush.  Teeth decaying.  No visual changes, runny nose, sore throat, mouth sores or tenderness. Lungs: No shortness of breath or cough.  No hemoptysis. Cardiac:  No chest pain,  palpitations, orthopnea, or PND. GI:  No nausea, vomiting, diarrhea, constipation, melena or hematochezia. GU:  No urgency, frequency, dysuria, or hematuria. Musculoskeletal:  No back pain.  No joint pain.  No muscle tenderness. Extremities:  No pain or swelling. Skin:  No rashes or skin changes. Neuro:  No headache, numbness or weakness, balance or coordination issues. Endocrine:  No diabetes, thyroid issues, hot flashes  or night sweats. Psych:  No mood changes, depression or anxiety. Pain:  No focal pain. Review of systems:  All other systems reviewed and found to be negative.  Physical Exam: Blood pressure 122/77, pulse 76, temperature (!) 95.3 F (35.2 C), temperature source Tympanic, resp. rate 18, weight 140 lb 3.4 oz (63.6 kg). GENERAL:  Well developed, well nourished, woman sitting comfortably in the exam room in no acute distress. MENTAL STATUS:  Alert and oriented to person, place and time. HEAD:  Short brown hair.  Normocephalic, atraumatic, face symmetric, no Cushingoid features. EYES:  Glasses.  Blue eyes.  Pupils equal round and reactive to light and accomodation.  No conjunctivitis or scleral icterus. ENT:  Oropharynx clear without lesion.  Tongue normal. Mucous membranes dry.  RESPIRATORY:  Clear to auscultation without rales, wheezes or rhonchi. CARDIOVASCULAR:  Regular rate and rhythm without murmur, rub or gallop. ABDOMEN:  Soft, non-tender, with active bowel sounds, and no hepatosplenomegaly.  No masses. SKIN:  Tan.  Bites nails.  No rashes, ulcers or lesions. EXTREMITIES: No edema, no skin discoloration or tenderness.  No palpable cords. LYMPH NODES: No palpable cervical, supraclavicular, axillary or inguinal adenopathy  NEUROLOGICAL: Unremarkable. PSYCH:  Appropriate.   No visits with results within 3 Day(s) from this visit.  Latest known visit with results is:  Appointment on 07/27/2016  Component Date Value Ref Range Status  . WBC 07/27/2016 3.1* 3.6 - 11.0  K/uL Final  . RBC 07/27/2016 4.41  3.80 - 5.20 MIL/uL Final  . Hemoglobin 07/27/2016 14.5  12.0 - 16.0 g/dL Final  . HCT 07/27/2016 42.4  35.0 - 47.0 % Final  . MCV 07/27/2016 96.3  80.0 - 100.0 fL Final  . MCH 07/27/2016 32.8  26.0 - 34.0 pg Final  . MCHC 07/27/2016 34.1  32.0 - 36.0 g/dL Final  . RDW 07/27/2016 13.2  11.5 - 14.5 % Final  . Platelets 07/27/2016 208  150 - 440 K/uL Final  . Neutrophils Relative % 07/27/2016 59  % Final  . Neutro Abs 07/27/2016 1.8  1.4 - 6.5 K/uL Final  . Lymphocytes Relative 07/27/2016 29  % Final  . Lymphs Abs 07/27/2016 0.9* 1.0 - 3.6 K/uL Final  . Monocytes Relative 07/27/2016 9  % Final  . Monocytes Absolute 07/27/2016 0.3  0.2 - 0.9 K/uL Final  . Eosinophils Relative 07/27/2016 3  % Final  . Eosinophils Absolute 07/27/2016 0.1  0 - 0.7 K/uL Final  . Basophils Relative 07/27/2016 0  % Final  . Basophils Absolute 07/27/2016 0.0  0 - 0.1 K/uL Final  . Sodium 07/27/2016 140  135 - 145 mmol/L Final  . Potassium 07/27/2016 4.6  3.5 - 5.1 mmol/L Final  . Chloride 07/27/2016 104  101 - 111 mmol/L Final  . CO2 07/27/2016 30  22 - 32 mmol/L Final  . Glucose, Bld 07/27/2016 113* 65 - 99 mg/dL Final  . BUN 07/27/2016 10  6 - 20 mg/dL Final  . Creatinine, Ser 07/27/2016 0.86  0.44 - 1.00 mg/dL Final  . Calcium 07/27/2016 9.6  8.9 - 10.3 mg/dL Final  . Total Protein 07/27/2016 7.1  6.5 - 8.1 g/dL Final  . Albumin 07/27/2016 4.2  3.5 - 5.0 g/dL Final  . AST 07/27/2016 23  15 - 41 U/L Final  . ALT 07/27/2016 11* 14 - 54 U/L Final  . Alkaline Phosphatase 07/27/2016 111  38 - 126 U/L Final  . Total Bilirubin 07/27/2016 0.8  0.3 - 1.2 mg/dL Final  .  GFR calc non Af Amer 07/27/2016 >60  >60 mL/min Final  . GFR calc Af Amer 07/27/2016 >60  >60 mL/min Final   Comment: (NOTE) The eGFR has been calculated using the CKD EPI equation. This calculation has not been validated in all clinical situations. eGFR's persistently <60 mL/min signify possible Chronic  Kidney Disease.   . Anion gap 07/27/2016 6  5 - 15 Final  . T4, Total 07/28/2016 6.9  4.5 - 12.0 ug/dL Final   Comment: (NOTE) Performed At: [facility] [place], [place] [id number] Lindon Romp MD WU:[id number]   . TSH 07/27/2016 3.528  0.350 - 4.500 uIU/mL Final    Assessment:  HOLLYN STUCKY is a 60 y.o. female with stage I right nasal cavity adenocarcinoma s/p  incisional biopsy of a polypoid mass in the right nasal cavity and paranasal sinuses on 06/25/2015.  Pathology revealed a 4 cm adenocarcinoma with lymphovascular invasion. There was no perineural invasion. Pathologic stage was T1NxMx.  Head MRI on 06/18/2015 revealed a polypoid 3 cm obstructing right nasal cavity mass.  PET scan on 07/05/2015 revealed a hypermetabolic lesion in the RIGHT ethmoid sinus consistent with biopsy-proven adenocarcinoma.  There was no evidence of local nodal metastasis in the neck.  There was a 1 mm nodule in the left upper lobe.  There was no evidence distant metastasis.  She received radiation therapy to the involved field and lymph node from 06/2015 - 09/2015.  She has xerostomia.  Teeth are decaying.  TSH and T4 were normal on 07/27/2016.  PET scan on 01/07/2016 revealed resolution of prior hypermetabolic lesion in the right nasal cavity/ethmoid sinus. The right lower lobe nodular opacity on screening CT scan was no longer visualized.  There was no evidence of metastatic disease.  Symptomatically, she have xerostomia and dental decay.  She has recurrent thrush.  She is smoking.  Plan: 1.  Discuss ongoing xerostomia.  Determine if any options for SalivaMax. 2.  Schedule PET scan 12/27/2016. 3.  Continue close monitoring with ENT (every 6 months). 4.  Obtain radiation summary. 5.  Encourage smoking cessation. 6.  RTC after PET scan for MD assessment and labs (CBC with diff, CMP).   Lequita Asal, MD  11/03/2016, 11:49 AM

## 2016-11-05 ENCOUNTER — Telehealth: Payer: Self-pay | Admitting: *Deleted

## 2016-11-05 NOTE — Telephone Encounter (Signed)
Discussed that Heather Beasley would cost $99 through [facility] a month for the medication and that the drug rep did not have any samples. Pt voiced understanding.

## 2016-11-13 ENCOUNTER — Ambulatory Visit: Payer: Medicare PPO

## 2016-11-20 ENCOUNTER — Telehealth: Payer: Self-pay | Admitting: Family Medicine

## 2016-11-20 NOTE — Telephone Encounter (Signed)
Patient called and asked if she can switch from [place] to [facility].  Patient was a patient of Dr.Walker's and was transferred to [facility].  Patient's husband is seen at our office.  Can patient switch?

## 2016-11-20 NOTE — Telephone Encounter (Signed)
Fine with me

## 2016-11-20 NOTE — Telephone Encounter (Signed)
Sure

## 2016-12-01 ENCOUNTER — Other Ambulatory Visit: Payer: Self-pay | Admitting: Internal Medicine

## 2016-12-01 ENCOUNTER — Ambulatory Visit (INDEPENDENT_AMBULATORY_CARE_PROVIDER_SITE_OTHER): Payer: Medicare PPO | Admitting: Internal Medicine

## 2016-12-01 ENCOUNTER — Encounter: Payer: Self-pay | Admitting: Internal Medicine

## 2016-12-01 DIAGNOSIS — G8929 Other chronic pain: Secondary | ICD-10-CM

## 2016-12-01 DIAGNOSIS — K589 Irritable bowel syndrome without diarrhea: Secondary | ICD-10-CM | POA: Insufficient documentation

## 2016-12-01 DIAGNOSIS — Z85820 Personal history of malignant melanoma of skin: Secondary | ICD-10-CM | POA: Insufficient documentation

## 2016-12-01 DIAGNOSIS — C300 Malignant neoplasm of nasal cavity: Secondary | ICD-10-CM | POA: Diagnosis not present

## 2016-12-01 DIAGNOSIS — R51 Headache: Secondary | ICD-10-CM

## 2016-12-01 DIAGNOSIS — I10 Essential (primary) hypertension: Secondary | ICD-10-CM

## 2016-12-01 DIAGNOSIS — K219 Gastro-esophageal reflux disease without esophagitis: Secondary | ICD-10-CM | POA: Diagnosis not present

## 2016-12-01 DIAGNOSIS — E785 Hyperlipidemia, unspecified: Secondary | ICD-10-CM | POA: Insufficient documentation

## 2016-12-01 DIAGNOSIS — Z9109 Other allergy status, other than to drugs and biological substances: Secondary | ICD-10-CM | POA: Insufficient documentation

## 2016-12-01 DIAGNOSIS — J4520 Mild intermittent asthma, uncomplicated: Secondary | ICD-10-CM

## 2016-12-01 DIAGNOSIS — J449 Chronic obstructive pulmonary disease, unspecified: Secondary | ICD-10-CM | POA: Insufficient documentation

## 2016-12-01 DIAGNOSIS — J439 Emphysema, unspecified: Secondary | ICD-10-CM

## 2016-12-01 DIAGNOSIS — Z1231 Encounter for screening mammogram for malignant neoplasm of breast: Secondary | ICD-10-CM

## 2016-12-01 DIAGNOSIS — K580 Irritable bowel syndrome with diarrhea: Secondary | ICD-10-CM | POA: Insufficient documentation

## 2016-12-01 DIAGNOSIS — E7800 Pure hypercholesterolemia, unspecified: Secondary | ICD-10-CM

## 2016-12-01 DIAGNOSIS — F329 Major depressive disorder, single episode, unspecified: Secondary | ICD-10-CM

## 2016-12-01 DIAGNOSIS — C319 Malignant neoplasm of accessory sinus, unspecified: Secondary | ICD-10-CM

## 2016-12-01 NOTE — Assessment & Plan Note (Signed)
LDL at goal off meds Encouraged her to consume a low fat diet Will check Lipid profile at annual exam

## 2016-12-01 NOTE — Assessment & Plan Note (Signed)
She will continue Imodium AD for now

## 2016-12-01 NOTE — Assessment & Plan Note (Signed)
Will perform spirometry in the next year Continue Albuterol prn Discussed smoking cesstion

## 2016-12-01 NOTE — Progress Notes (Signed)
HPI  Pt presents to the clinic today to establish care and transfer Dr. Gilford Rile.  Environmental Allergies: Worse in the spring. She takes Zyrtec as needed with good relief. She was taking allergy shots years ago with Dr. Tami Ribas.  Asthma/COPD: She reports she was told years ago that she has mild asthma. She does get occasionally wheezy, but denies cough at night. She uses an Albuterol as needed. She does not recall having any breathing test that she is aware. She does smoke but has cut back.  Arthritis: Mainly in her hands, back and knees. She does not take anything OTC for this. She takes Flexeril as needed. She has seen pain management in the past, treated with Gabapentin, but stopped due to side effects.   History of Melanoma: Excised off her left upper leg. She follows with dermatology and has an upcoming appt March 19th  Cancer of Nasal Cavity/Sinus: s/p excision of polyp and radiation. Her radiation oncologist is Dr. Donella Stade. Her chemo oncologist is Dr. Artelia Laroche. She also follows with ENT, Dr. Tami Ribas.  Depression: Currently not an issue. She is not taking anything for this.  GERD: Triggered by spicy, greasy or tomato based foods. She takes Protonix daily and denies breakthrough symptoms. She follows with Dr. Tiffany Kocher.  Frequent Headaches: These occur daily. They are located in her temples. She describes the pain as pounding. She denies visual changes, dizziness, sensitivity to light or sound, nausea or vomiting. She takes Tylenol and Excedrin Migraine with good relief.  HTN: Her BP today is 120/78. She was treated for this in the past, but stopped the medication on her own due to low blood pressure.   HLD: Her last LDL was 82, 11/2015. She is not taking any cholesterol lowering medication. She used to be on Liptor but had myalgias with it. She does eat some fried foods.   IBS: Mainly diarrhea. She uses Imodium AD daily.  Flu: 06/2016 Tetanus: ? 2010, Dr. Toy Cookey Pneumovax: 2013 Prevnar:  06/2016 Pap Smear: 06/2014- total hysterectomy Mammogram: 2/2017[place] [facility] Colon Screening: 02/2014, every 5 years Vision Screening: as needed Dentist: biannually   Past Medical History:  Diagnosis Date  . Allergic rhinitis   . Arthritis   . Asthma   . Cancer ([place])    melanoma  . Cancer of nasal cavity and sinus ([place]) 07/07/2015   radiation  . Cervical spondylosis   . Colitis   . Colon polyps   . Depression   . Diverticulosis   . GERD (gastroesophageal reflux disease)   . Glaucoma   . Headaches, cluster   . Hyperlipidemia   . Hypertension   . IBS (irritable bowel syndrome)   . Kidney stones   . Neuropathy ([place])   . PONV (postoperative nausea and vomiting)   . Tobacco abuse     Current Outpatient Prescriptions  Medication Sig Dispense Refill  . albuterol (PROVENTIL HFA;VENTOLIN HFA) 108 (90 BASE) MCG/ACT inhaler Inhale 2 puffs into the lungs every 6 (six) hours as needed. 18 g 2  . aspirin EC 81 MG tablet Take 81 mg by mouth daily.    . cetirizine (ZYRTEC) 10 MG tablet Take 10 mg by mouth daily. Allergies.    . clotrimazole (MYCELEX) 10 MG troche Take 10 mg by mouth 3 (three) times daily.    . cyclobenzaprine (FLEXERIL) 10 MG tablet Take 1 tablet (10 mg total) by mouth 3 (three) times daily. 90 tablet 6  . fluconazole (DIFLUCAN) 200 MG tablet     . loperamide (IMODIUM  A-D) 2 MG tablet Take 2 mg by mouth 4 (four) times daily as needed for diarrhea or loose stools.    . Multiple Vitamins-Minerals (CENTRUM ADULTS PO) Take 1 tablet by mouth.    . pantoprazole (PROTONIX) 40 MG tablet Take 1 tablet (40 mg total) by mouth 2 (two) times daily. 60 tablet 11   No current facility-administered medications for this visit.     Allergies  Allergen Reactions  . Albumin (Human) Rash  . Amoxicillin-Pot Clavulanate Hives  . Gabapentin Shortness Of Breath  . Latex Dermatitis and Rash  . Betadine [Povidone Iodine] Dermatitis, Rash and Other (See Comments)    Skin burning Topical  betadine and iodine have this reaction with patient.  IV contrast is NOT a problem.  jkl  . Sucralfate     Other reaction(s): Other (See Comments) Abdominal Pain  . Amoxicillin     REACTION: rash  . Chantix [Varenicline Tartrate]     Heart racing  . Erythromycin     REACTION: rash  . Sucralfate Nausea And Vomiting    Heart racing Lightheaded     Family History  Problem Relation Age of Onset  . Hypertension Mother   . Hyperlipidemia Mother   . Diabetes Mother   . Colon cancer Father   . Pancreatic cancer Father   . Cancer Father     Colon & Pancreatic  . Breast cancer Paternal Grandmother 65  . Diabetes Paternal Grandmother   . Coronary artery disease Paternal Grandmother   . Heart failure Paternal Grandmother   . Cancer Paternal Grandmother     breast  . Stroke Brother     Social History   Social History  . Marital status: Married    Spouse name: N/A  . Number of children: 2  . Years of education: N/A   Occupational History  . Cashier    Social History Main Topics  . Smoking status: Current Every Day Smoker    Packs/day: 0.50    Years: 39.00    Types: Cigarettes  . Smokeless tobacco: Never Used     Comment: e cigs currently--trying to quit  . Alcohol use No  . Drug use: No  . Sexual activity: Not Currently    Partners: Male   Other Topics Concern  . Not on file   Social History Narrative   Lives in [place] with husband. Dog in home. Work - disabled for neck and back pain.    ROS:  Constitutional: Pt reports headaches. Denies fever, malaise, fatigue, or abrupt weight changes.  HEENT: Pt reports dry mouth. Denies eye pain, eye redness, ear pain, ringing in the ears, wax buildup, runny nose, nasal congestion, bloody nose, or sore throat. Respiratory: Denies difficulty breathing, shortness of breath, cough or sputum production.   Cardiovascular: Denies chest pain, chest tightness, palpitations or swelling in the hands or feet.  Gastrointestinal:  Pt reports diarrhea. Denies abdominal pain, bloating, constipation, or blood in the stool.  GU: Denies frequency, urgency, pain with urination, blood in urine, odor or discharge. Musculoskeletal: Pt reports intermittent joint pains. Denies difficulty with gait, muscle pain or joint swelling.  Skin: Denies redness, rashes, lesions or ulcercations.  Neurological: Denies dizziness, difficulty with memory, difficulty with speech or problems with balance and coordination.  Psych: Denies anxiety, depression, SI/HI.  No other specific complaints in a complete review of systems (except as listed in HPI above).  PE:  BP 120/78   Pulse 75   Temp 98.3 F (36.8 C) (Oral)  Ht 5\' 5"  (1.651 m)   Wt 142 lb 12 oz (64.8 kg)   SpO2 99%   BMI 23.75 kg/m  Wt Readings from Last 3 Encounters:  12/01/16 142 lb 12 oz (64.8 kg)  11/03/16 140 lb 3.4 oz (63.6 kg)  09/28/16 139 lb 4 oz (63.2 kg)    General: Appears her stated age, well developed, well nourished in NAD. HEENT: Throat/Mouth: Teeth present, mucosa pink and dry, no lesions or ulcerations noted.  Neck: Neck supple, trachea midline. No masses, lumps or thyromegaly present.  Cardiovascular: Normal rate and rhythm.  Pulmonary/Chest: Normal effort and positive vesicular breath sounds. No respiratory distress. No wheezes, rales or ronchi noted.  Musculoskeletal:  No difficulty with gait.  Neurological: Alert and oriented.  Psychiatric: Mood and affect normal. Behavior is normal. Judgment and thought content normal.     BMET    Component Value Date/Time   NA 140 07/27/2016 0925   K 4.6 07/27/2016 0925   CL 104 07/27/2016 0925   CO2 30 07/27/2016 0925   GLUCOSE 113 (H) 07/27/2016 0925   BUN 10 07/27/2016 0925   CREATININE 0.86 07/27/2016 0925   CALCIUM 9.6 07/27/2016 0925   GFRNONAA >60 07/27/2016 0925   GFRAA >60 07/27/2016 0925    Lipid Panel     Component Value Date/Time   CHOL 147 11/29/2015 1002   TRIG 105.0 11/29/2015 1002    HDL 44.20 11/29/2015 1002   CHOLHDL 3 11/29/2015 1002   VLDL 21.0 11/29/2015 1002   LDLCALC 82 11/29/2015 1002    CBC    Component Value Date/Time   WBC 3.1 (L) 07/27/2016 0925   RBC 4.41 07/27/2016 0925   HGB 14.5 07/27/2016 0925   HCT 42.4 07/27/2016 0925   PLT 208 07/27/2016 0925   MCV 96.3 07/27/2016 0925   MCH 32.8 07/27/2016 0925   MCHC 34.1 07/27/2016 0925   RDW 13.2 07/27/2016 0925   LYMPHSABS 0.9 (L) 07/27/2016 0925   MONOABS 0.3 07/27/2016 0925   EOSABS 0.1 07/27/2016 0925   BASOSABS 0.0 07/27/2016 0925    Hgb A1C No results found for: HGBA1C   Assessment and Plan:

## 2016-12-01 NOTE — Assessment & Plan Note (Signed)
Currently not an issue Will monitor 

## 2016-12-01 NOTE — Assessment & Plan Note (Signed)
Will perform spirometry within the next year Continue Albuterol for now Stop smoking

## 2016-12-01 NOTE — Assessment & Plan Note (Signed)
In remission She will continue to follow with her ENT and oncologist

## 2016-12-01 NOTE — Assessment & Plan Note (Signed)
Controlled off meds  Will monitor 

## 2016-12-01 NOTE — Patient Instructions (Signed)
Fat and Cholesterol Restricted Diet Introduction Getting too much fat and cholesterol in your diet may cause health problems. Following this diet helps keep your fat and cholesterol at normal levels. This can keep you from getting sick. What types of fat should I choose?  Choose monosaturated and polyunsaturated fats. These are found in foods such as olive oil, canola oil, flaxseeds, walnuts, almonds, and seeds.  Eat more omega-3 fats. Good choices include salmon, mackerel, sardines, tuna, flaxseed oil, and ground flaxseeds.  Limit saturated fats. These are in animal products such as meats, butter, and cream. They can also be in plant products such as palm oil, palm kernel oil, and coconut oil.  Avoid foods with partially hydrogenated oils in them. These contain trans fats. Examples of foods that have trans fats are stick margarine, some tub margarines, cookies, crackers, and other baked goods. What general guidelines do I need to follow?  Check food labels. Look for the words "trans fat" and "saturated fat."  When preparing a meal:  Fill half of your plate with vegetables and green salads.  Fill one fourth of your plate with whole grains. Look for the word "whole" as the first word in the ingredient list.  Fill one fourth of your plate with lean protein foods.  Eat more foods that have fiber, like apples, carrots, beans, peas, and barley.  Eat more home-cooked foods. Eat less at restaurants and buffets.  Limit or avoid alcohol.  Limit foods high in starch and sugar.  Limit fried foods.  Cook foods without frying them. Baking, boiling, grilling, and broiling are all great options.  Lose weight if you are overweight. Losing even a small amount of weight can help your overall health. It can also help prevent diseases such as diabetes and heart disease. What foods can I eat? Grains  Whole grains, such as whole wheat or whole grain breads, crackers, cereals, and pasta. Unsweetened  oatmeal, bulgur, barley, quinoa, or brown rice. Corn or whole wheat flour tortillas. Vegetables  Fresh or frozen vegetables (raw, steamed, roasted, or grilled). Green salads. Fruits  All fresh, canned (in natural juice), or frozen fruits. Meat and Other Protein Products  Ground beef (85% or leaner), grass-fed beef, or beef trimmed of fat. Skinless chicken or turkey. Ground chicken or turkey. Pork trimmed of fat. All fish and seafood. Eggs. Dried beans, peas, or lentils. Unsalted nuts or seeds. Unsalted canned or dry beans. Dairy  Low-fat dairy products, such as skim or 1% milk, 2% or reduced-fat cheeses, low-fat ricotta or cottage cheese, or plain low-fat yogurt. Fats and Oils  Tub margarines without trans fats. Light or reduced-fat mayonnaise and salad dressings. Avocado. Olive, canola, sesame, or safflower oils. Natural peanut or almond butter (choose ones without added sugar and oil). The items listed above may not be a complete list of recommended foods or beverages. Contact your dietitian for more options.  What foods are not recommended? Grains  White bread. White pasta. White rice. Cornbread. Bagels, pastries, and croissants. Crackers that contain trans fat. Vegetables  White potatoes. Corn. Creamed or fried vegetables. Vegetables in a cheese sauce. Fruits  Dried fruits. Canned fruit in light or heavy syrup. Fruit juice. Meat and Other Protein Products  Fatty cuts of meat. Ribs, chicken wings, bacon, sausage, bologna, salami, chitterlings, fatback, hot dogs, bratwurst, and packaged luncheon meats. Liver and organ meats. Dairy  Whole or 2% milk, cream, half-and-half, and cream cheese. Whole milk cheeses. Whole-fat or sweetened yogurt. Full-fat cheeses. Nondairy creamers and   whipped toppings. Processed cheese, cheese spreads, or cheese curds. Sweets and Desserts  Corn syrup, sugars, honey, and molasses. Candy. Jam and jelly. Syrup. Sweetened cereals. Cookies, pies, cakes, donuts,  muffins, and ice cream. Fats and Oils  Butter, stick margarine, lard, shortening, ghee, or bacon fat. Coconut, palm kernel, or palm oils. Beverages  Alcohol. Sweetened drinks (such as sodas, lemonade, and fruit drinks or punches). The items listed above may not be a complete list of foods and beverages to avoid. Contact your dietitian for more information.  This information is not intended to replace advice given to you by your health care provider. Make sure you discuss any questions you have with your health care provider. Document Released: 04/12/2012 Document Revised: 06/18/2016 Document Reviewed: 01/11/2014  2017 Elsevier  

## 2016-12-01 NOTE — Assessment & Plan Note (Signed)
Continue Zyrtec as needed 

## 2016-12-01 NOTE — Assessment & Plan Note (Signed)
She will follow with dermatology yearly

## 2016-12-01 NOTE — Assessment & Plan Note (Signed)
She fills like her sinus issues are causing her headaches They are not worse so she wants to continue Tylenol and Excedrin for now If worse, consider starting prophylactic medication

## 2016-12-01 NOTE — Assessment & Plan Note (Signed)
Controlled with Protonix Will discuss trying to wean this at her next visit Discussed avoiding foods that trigger her reflux

## 2016-12-29 ENCOUNTER — Ambulatory Visit
Admission: RE | Admit: 2016-12-29 | Discharge: 2016-12-29 | Disposition: A | Discharge status: Home / Self Care | Payer: Medicare PPO | Source: Ambulatory Visit | Attending: Internal Medicine | Admitting: Internal Medicine

## 2016-12-29 DIAGNOSIS — Z1231 Encounter for screening mammogram for malignant neoplasm of breast: Secondary | ICD-10-CM | POA: Diagnosis not present

## 2017-01-06 ENCOUNTER — Ambulatory Visit: Payer: Medicare PPO

## 2017-01-08 ENCOUNTER — Inpatient Hospital Stay: Payer: Medicare PPO

## 2017-01-08 ENCOUNTER — Inpatient Hospital Stay: Payer: Medicare PPO | Admitting: Oncology

## 2017-01-11 DIAGNOSIS — H61002 Unspecified perichondritis of left external ear: Secondary | ICD-10-CM | POA: Diagnosis not present

## 2017-01-12 ENCOUNTER — Ambulatory Visit
Admission: RE | Admit: 2017-01-12 | Discharge: 2017-01-12 | Disposition: A | Discharge status: Home / Self Care | Payer: Medicare PPO | Source: Ambulatory Visit | Attending: Hematology and Oncology | Admitting: Hematology and Oncology

## 2017-01-12 DIAGNOSIS — C300 Malignant neoplasm of nasal cavity: Secondary | ICD-10-CM | POA: Insufficient documentation

## 2017-01-12 DIAGNOSIS — Y842 Radiological procedure and radiotherapy as the cause of abnormal reaction of the patient, or of later complication, without mention of misadventure at the time of the procedure: Secondary | ICD-10-CM | POA: Diagnosis not present

## 2017-01-12 DIAGNOSIS — I2510 Atherosclerotic heart disease of native coronary artery without angina pectoris: Secondary | ICD-10-CM | POA: Insufficient documentation

## 2017-01-12 DIAGNOSIS — C319 Malignant neoplasm of accessory sinus, unspecified: Secondary | ICD-10-CM | POA: Insufficient documentation

## 2017-01-12 DIAGNOSIS — I700 Atherosclerosis of aorta: Secondary | ICD-10-CM | POA: Diagnosis not present

## 2017-01-12 DIAGNOSIS — K117 Disturbances of salivary secretion: Secondary | ICD-10-CM

## 2017-01-12 DIAGNOSIS — K5730 Diverticulosis of large intestine without perforation or abscess without bleeding: Secondary | ICD-10-CM | POA: Diagnosis not present

## 2017-01-12 LAB — GLUCOSE, CAPILLARY: Glucose-Capillary: 82 mg/dL (ref 65–99)

## 2017-01-12 MED ORDER — FLUDEOXYGLUCOSE F - 18 (FDG) INJECTION
12.0000 | Freq: Once | INTRAVENOUS | Status: AC | PRN
  Administered 2017-01-12: 12.06 via INTRAVENOUS

## 2017-01-14 ENCOUNTER — Inpatient Hospital Stay (HOSPITAL_BASED_OUTPATIENT_CLINIC_OR_DEPARTMENT_OTHER): Payer: Medicare PPO | Admitting: Hematology and Oncology

## 2017-01-14 ENCOUNTER — Inpatient Hospital Stay: Payer: Medicare PPO | Attending: Oncology

## 2017-01-14 VITALS — BP 124/81 | HR 69 | Temp 95.4°F | Resp 18 | Wt 150.1 lb

## 2017-01-14 DIAGNOSIS — K029 Dental caries, unspecified: Secondary | ICD-10-CM | POA: Diagnosis not present

## 2017-01-14 DIAGNOSIS — J45909 Unspecified asthma, uncomplicated: Secondary | ICD-10-CM

## 2017-01-14 DIAGNOSIS — M129 Arthropathy, unspecified: Secondary | ICD-10-CM | POA: Insufficient documentation

## 2017-01-14 DIAGNOSIS — I2510 Atherosclerotic heart disease of native coronary artery without angina pectoris: Secondary | ICD-10-CM

## 2017-01-14 DIAGNOSIS — Z86010 Personal history of colonic polyps: Secondary | ICD-10-CM

## 2017-01-14 DIAGNOSIS — Z800 Family history of malignant neoplasm of digestive organs: Secondary | ICD-10-CM

## 2017-01-14 DIAGNOSIS — F17210 Nicotine dependence, cigarettes, uncomplicated: Secondary | ICD-10-CM | POA: Diagnosis not present

## 2017-01-14 DIAGNOSIS — C319 Malignant neoplasm of accessory sinus, unspecified: Secondary | ICD-10-CM

## 2017-01-14 DIAGNOSIS — I10 Essential (primary) hypertension: Secondary | ICD-10-CM | POA: Insufficient documentation

## 2017-01-14 DIAGNOSIS — I700 Atherosclerosis of aorta: Secondary | ICD-10-CM | POA: Diagnosis not present

## 2017-01-14 DIAGNOSIS — C300 Malignant neoplasm of nasal cavity: Secondary | ICD-10-CM

## 2017-01-14 DIAGNOSIS — Z803 Family history of malignant neoplasm of breast: Secondary | ICD-10-CM

## 2017-01-14 DIAGNOSIS — K589 Irritable bowel syndrome without diarrhea: Secondary | ICD-10-CM

## 2017-01-14 DIAGNOSIS — Z8522 Personal history of malignant neoplasm of nasal cavities, middle ear, and accessory sinuses: Secondary | ICD-10-CM

## 2017-01-14 DIAGNOSIS — G629 Polyneuropathy, unspecified: Secondary | ICD-10-CM | POA: Diagnosis not present

## 2017-01-14 DIAGNOSIS — K117 Disturbances of salivary secretion: Secondary | ICD-10-CM

## 2017-01-14 DIAGNOSIS — Z87442 Personal history of urinary calculi: Secondary | ICD-10-CM | POA: Diagnosis not present

## 2017-01-14 DIAGNOSIS — K5730 Diverticulosis of large intestine without perforation or abscess without bleeding: Secondary | ICD-10-CM | POA: Diagnosis not present

## 2017-01-14 DIAGNOSIS — Z85820 Personal history of malignant melanoma of skin: Secondary | ICD-10-CM

## 2017-01-14 DIAGNOSIS — E785 Hyperlipidemia, unspecified: Secondary | ICD-10-CM | POA: Insufficient documentation

## 2017-01-14 DIAGNOSIS — Y842 Radiological procedure and radiotherapy as the cause of abnormal reaction of the patient, or of later complication, without mention of misadventure at the time of the procedure: Secondary | ICD-10-CM

## 2017-01-14 DIAGNOSIS — K219 Gastro-esophageal reflux disease without esophagitis: Secondary | ICD-10-CM

## 2017-01-14 DIAGNOSIS — F329 Major depressive disorder, single episode, unspecified: Secondary | ICD-10-CM | POA: Diagnosis not present

## 2017-01-14 DIAGNOSIS — M479 Spondylosis, unspecified: Secondary | ICD-10-CM | POA: Diagnosis not present

## 2017-01-14 DIAGNOSIS — Z923 Personal history of irradiation: Secondary | ICD-10-CM

## 2017-01-14 LAB — COMPREHENSIVE METABOLIC PANEL
ALT: 14 U/L (ref 14–54)
AST: 22 U/L (ref 15–41)
Albumin: 4.1 g/dL (ref 3.5–5.0)
Alkaline Phosphatase: 94 U/L (ref 38–126)
Anion gap: 6 (ref 5–15)
BUN: 14 mg/dL (ref 6–20)
CO2: 28 mmol/L (ref 22–32)
Calcium: 9.2 mg/dL (ref 8.9–10.3)
Chloride: 104 mmol/L (ref 101–111)
Creatinine, Ser: 0.81 mg/dL (ref 0.44–1.00)
GFR calc Af Amer: >60 mL/min (ref >60)
GFR calc non Af Amer: >60 mL/min (ref >60)
Glucose, Bld: 92 mg/dL (ref 65–99)
Potassium: 4.8 mmol/L (ref 3.5–5.1)
Sodium: 138 mmol/L (ref 135–145)
Total Bilirubin: 1 mg/dL (ref 0.3–1.2)
Total Protein: 7.2 g/dL (ref 6.5–8.1)

## 2017-01-14 LAB — CBC WITH DIFFERENTIAL/PLATELET
Basophils Absolute: 0 10*3/uL (ref 0–0.1)
Basophils Relative: 0 %
Eosinophils Absolute: 0.1 10*3/uL (ref 0–0.7)
Eosinophils Relative: 3 %
HCT: 42.9 % (ref 35.0–47.0)
Hemoglobin: 14.4 g/dL (ref 12.0–16.0)
Lymphocytes Relative: 36 %
Lymphs Abs: 1.2 10*3/uL (ref 1.0–3.6)
MCH: 32.2 pg (ref 26.0–34.0)
MCHC: 33.7 g/dL (ref 32.0–36.0)
MCV: 95.7 fL (ref 80.0–100.0)
Monocytes Absolute: 0.3 10*3/uL (ref 0.2–0.9)
Monocytes Relative: 10 %
Neutro Abs: 1.7 10*3/uL (ref 1.4–6.5)
Neutrophils Relative %: 51 %
Platelets: 229 10*3/uL (ref 150–440)
RBC: 4.48 MIL/uL (ref 3.80–5.20)
RDW: 13.4 % (ref 11.5–14.5)
WBC: 3.4 10*3/uL — ABNORMAL LOW (ref 3.6–11.0)

## 2017-01-14 NOTE — Progress Notes (Signed)
Patient states she has noticed more SOB on exertion the last couple of weeks.

## 2017-01-14 NOTE — Progress Notes (Signed)
[facility] day:  01/14/2017  Chief Complaint: Heather Beasley is a 61 y.o. female with stage I right nasal cavity adenocarcinoma who is seen for assessment and review of interval PET scan.  HPI:  The patient was last seen in the medical oncology clinic on 11/03/2016.  At that time, she had xerostomia and dental decay.  She had recurrent thrush.  She was smoking.  We discussed SalivaMax.  Previously, there were issues with coverage.  We discussed smoking cessation.  She was to follow-up with ENT.  PET scan on 01/12/2017 revealed no metabolic evidence of local tumor recurrence in the nasal cavity/paranasal sinuses. There was no metabolic evidence of neck nodal or distant metastatic disease.  Additional findings include aortic atherosclerosis, 1 vessel coronary atherosclerosis and sigmoid diverticulosis.  During the interim, she has done well. She continues to have a dry mouth. She follows up with Dr. Tami Ribas in 01/2017 or 02/2017. She has not completely stop smoking. She is using essential oils.   Past Medical History:  Diagnosis Date  . Allergic rhinitis   . Arthritis   . Asthma   . Cancer ([place])    melanoma  . Cancer of nasal cavity and sinus ([place]) 07/07/2015   radiation  . Cervical spondylosis   . Colitis   . Colon polyps   . Depression   . Diverticulosis   . GERD (gastroesophageal reflux disease)   . Glaucoma   . Headaches, cluster   . Hyperlipidemia   . Hypertension   . IBS (irritable bowel syndrome)   . Kidney stones   . Neuropathy ([place])   . PONV (postoperative nausea and vomiting)   . Tobacco abuse     Past Surgical History:  Procedure Laterality Date  . ABDOMINAL HYSTERECTOMY  1998  . APPENDECTOMY  1998  . ARM FX REPAIR  2001  . BACK SURGERY    . CERVICAL FUSION  2010  . CHOLECYSTECTOMY  08/2011   Dr Rochel Brome  . COLONOSCOPY     Dr Tiffany Kocher  . CYSTOSCOPY     Dr. Bernardo Heater  . DILATION AND CURETTAGE OF UTERUS    .  ESOPHAGOGASTRODUODENOSCOPY  2012   Dr Tiffany Kocher  . FRACTURE SURGERY  2001   plate in right wrist and arm  . IMAGE GUIDED SINUS SURGERY N/A 06/25/2015   Procedure: IMAGE GUIDED SINUS SURGERY;  Surgeon: Beverly Gust, MD;  Location: [facility] ORS;  Service: ENT;  Laterality: N/A;  . LUMBAR SPINE SURGERY  07/2004  . POLYPECTOMY N/A 06/25/2015   Procedure: POLYPECTOMY NASAL;  Surgeon: Beverly Gust, MD;  Location: [facility] ORS;  Service: ENT;  Laterality: N/A;  . [place]  . VAGINAL DELIVERY     x2    Family History  Problem Relation Age of Onset  . Hypertension Mother   . Hyperlipidemia Mother   . Diabetes Mother   . Colon cancer Father   . Pancreatic cancer Father   . Cancer Father     Colon & Pancreatic  . Breast cancer Paternal Grandmother 27  . Diabetes Paternal Grandmother   . Coronary artery disease Paternal Grandmother   . Heart failure Paternal Grandmother   . Cancer Paternal Grandmother     breast  . Stroke Brother     Social History:  reports that she has been smoking Cigarettes.  She has a 9.75 pack-year smoking history. She has never used smokeless tobacco. She reports that she does not drink alcohol or  use drugs.  She smoked "40 some years".  Previously 1 1/2 pack a day.  She currently smokes 4 cigarettes a day (trying to quit).  The patient is alone today.  Allergies:  Allergies  Allergen Reactions  . Albumin (Human) Rash  . Amoxicillin-Pot Clavulanate Hives  . Gabapentin Shortness Of Breath  . Latex Dermatitis and Rash  . Betadine [Povidone Iodine] Dermatitis, Rash and Other (See Comments)    Skin burning Topical betadine and iodine have this reaction with patient.  IV contrast is NOT a problem.  jkl  . Sucralfate     Other reaction(s): Other (See Comments) Abdominal Pain  . Amoxicillin     REACTION: rash  . Chantix [Varenicline Tartrate]     Heart racing  . Erythromycin     REACTION: rash  . Sucralfate Nausea And Vomiting    Heart  racing Lightheaded     Current Medications: Current Outpatient Prescriptions  Medication Sig Dispense Refill  . albuterol (PROVENTIL HFA;VENTOLIN HFA) 108 (90 BASE) MCG/ACT inhaler Inhale 2 puffs into the lungs every 6 (six) hours as needed. 18 g 2  . aspirin EC 81 MG tablet Take 81 mg by mouth daily.    . cetirizine (ZYRTEC) 10 MG tablet Take 10 mg by mouth daily. Allergies.    . clotrimazole (MYCELEX) 10 MG troche Take 10 mg by mouth 3 (three) times daily.    . cyclobenzaprine (FLEXERIL) 10 MG tablet Take 1 tablet (10 mg total) by mouth 3 (three) times daily. 90 tablet 6  . fluconazole (DIFLUCAN) 200 MG tablet     . loperamide (IMODIUM A-D) 2 MG tablet Take 2 mg by mouth 4 (four) times daily as needed for diarrhea or loose stools.    . Multiple Vitamins-Minerals (CENTRUM ADULTS PO) Take 1 tablet by mouth.    . pantoprazole (PROTONIX) 40 MG tablet Take 1 tablet (40 mg total) by mouth 2 (two) times daily. 60 tablet 11  . mometasone (ELOCON) 0.1 % cream      No current facility-administered medications for this visit.     Review of Systems:  GENERAL:  Feels "ok". No fevers or sweats.  Weight up 10 pounds. PERFORMANCE STATUS (ECOG):  1 HEENT:  Dry mouth.  Teeth decaying.  No visual changes, runny nose, sore throat, mouth sores or tenderness. Lungs: No shortness of breath or cough.  No hemoptysis. Cardiac:  No chest pain, palpitations, orthopnea, or PND. GI:  No nausea, vomiting, diarrhea, constipation, melena or hematochezia. GU:  No urgency, frequency, dysuria, or hematuria. Musculoskeletal:  No back pain.  No joint pain.  No muscle tenderness. Extremities:  No pain or swelling. Skin:  No rashes or skin changes. Neuro:  No headache, numbness or weakness, balance or coordination issues. Endocrine:  No diabetes, thyroid issues, hot flashes or night sweats. Psych:  No mood changes, depression or anxiety. Pain:  No focal pain. Review of systems:  All other systems reviewed and found  to be negative.  Physical Exam: Blood pressure 124/81, pulse 69, temperature (!) 95.4 F (35.2 C), resp. rate 18, weight 150 lb 1 oz (68.1 kg). GENERAL:  Well developed, well nourished, woman sitting comfortably in the exam room in no acute distress. MENTAL STATUS:  Alert and oriented to person, place and time. HEAD:  Short brown hair.  Normocephalic, atraumatic, face symmetric, no Cushingoid features. EYES:  Glasses.  Blue eyes.  Pupils equal round and reactive to light and accomodation.  No conjunctivitis or scleral icterus. ENT:  Oropharynx  clear without lesion.  No thrush.  Tongue normal.  Mucous membranes dry.  RESPIRATORY:  Clear to auscultation without rales, wheezes or rhonchi. CARDIOVASCULAR:  Regular rate and rhythm without murmur, rub or gallop. ABDOMEN:  Soft, non-tender, with active bowel sounds, and no hepatosplenomegaly.  No masses. SKIN:  Tan.  Bites nails.  No rashes, ulcers or lesions. EXTREMITIES: No edema, no skin discoloration or tenderness.  No palpable cords. LYMPH NODES: No palpable cervical, supraclavicular, axillary or inguinal adenopathy  NEUROLOGICAL: Unremarkable. PSYCH:  Appropriate.   Appointment on 01/14/2017  Component Date Value Ref Range Status  . Sodium 01/14/2017 138  135 - 145 mmol/L Final  . Potassium 01/14/2017 4.8  3.5 - 5.1 mmol/L Final  . Chloride 01/14/2017 104  101 - 111 mmol/L Final  . CO2 01/14/2017 28  22 - 32 mmol/L Final  . Glucose, Bld 01/14/2017 92  65 - 99 mg/dL Final  . BUN 01/14/2017 14  6 - 20 mg/dL Final  . Creatinine, Ser 01/14/2017 0.81  0.44 - 1.00 mg/dL Final  . Calcium 01/14/2017 9.2  8.9 - 10.3 mg/dL Final  . Total Protein 01/14/2017 7.2  6.5 - 8.1 g/dL Final  . Albumin 01/14/2017 4.1  3.5 - 5.0 g/dL Final  . AST 01/14/2017 22  15 - 41 U/L Final  . ALT 01/14/2017 14  14 - 54 U/L Final  . Alkaline Phosphatase 01/14/2017 94  38 - 126 U/L Final  . Total Bilirubin 01/14/2017 1.0  0.3 - 1.2 mg/dL Final  . GFR calc non Af  Amer 01/14/2017 >60  >60 mL/min Final  . GFR calc Af Amer 01/14/2017 >60  >60 mL/min Final   Comment: (NOTE) The eGFR has been calculated using the CKD EPI equation. This calculation has not been validated in all clinical situations. eGFR's persistently <60 mL/min signify possible Chronic Kidney Disease.   . Anion gap 01/14/2017 6  5 - 15 Final  . WBC 01/14/2017 3.4* 3.6 - 11.0 K/uL Final  . RBC 01/14/2017 4.48  3.80 - 5.20 MIL/uL Final  . Hemoglobin 01/14/2017 14.4  12.0 - 16.0 g/dL Final  . HCT 01/14/2017 42.9  35.0 - 47.0 % Final  . MCV 01/14/2017 95.7  80.0 - 100.0 fL Final  . MCH 01/14/2017 32.2  26.0 - 34.0 pg Final  . MCHC 01/14/2017 33.7  32.0 - 36.0 g/dL Final  . RDW 01/14/2017 13.4  11.5 - 14.5 % Final  . Platelets 01/14/2017 229  150 - 440 K/uL Final  . Neutrophils Relative % 01/14/2017 51  % Final  . Neutro Abs 01/14/2017 1.7  1.4 - 6.5 K/uL Final  . Lymphocytes Relative 01/14/2017 36  % Final  . Lymphs Abs 01/14/2017 1.2  1.0 - 3.6 K/uL Final  . Monocytes Relative 01/14/2017 10  % Final  . Monocytes Absolute 01/14/2017 0.3  0.2 - 0.9 K/uL Final  . Eosinophils Relative 01/14/2017 3  % Final  . Eosinophils Absolute 01/14/2017 0.1  0 - 0.7 K/uL Final  . Basophils Relative 01/14/2017 0  % Final  . Basophils Absolute 01/14/2017 0.0  0 - 0.1 K/uL Final  Hospital Outpatient Visit on 01/12/2017  Component Date Value Ref Range Status  . Glucose-Capillary 01/12/2017 82  65 - 99 mg/dL Final    Assessment:  Heather Beasley is a 61 y.o. female with stage I right nasal cavity adenocarcinoma s/p  incisional biopsy of a polypoid mass in the right nasal cavity and paranasal sinuses on 06/25/2015.  Pathology revealed  a 4 cm adenocarcinoma with lymphovascular invasion. There was no perineural invasion. Pathologic stage was [id number].  Head MRI on 06/18/2015 revealed a polypoid 3 cm obstructing right nasal cavity mass.  PET scan on 07/05/2015 revealed a hypermetabolic lesion in the RIGHT  ethmoid sinus consistent with biopsy-proven adenocarcinoma.  There was no evidence of local nodal metastasis in the neck.  There was a 1 mm nodule in the left upper lobe.  There was no evidence distant metastasis.  She received radiation therapy to the involved field and lymph node from 06/2015 - 09/2015.  She has xerostomia.  Teeth are decaying.  TSH and T4 were normal on 07/27/2016.  PET scan on 01/07/2016 revealed resolution of prior hypermetabolic lesion in the right nasal cavity/ethmoid sinus. The right lower lobe nodular opacity on screening CT scan was no longer visualized.  There was no evidence of metastatic disease.  PET scan on 01/12/2017 revealed no metabolic evidence of local tumor recurrence in the nasal cavity/paranasal sinuses. There was no metabolic evidence of neck nodal or distant metastatic disease.  Symptomatically, she have xerostomia and dental decay.  She is trying to stop smoking.  Plan: 1.  Discuss PET scan.  No evidene of recurrent disease. 2.  Discuss ongoing xerostomia.  Try SalivaMax if covered. 3.  Follow-up as scheduled with Dr. Tami Ribas in 01/2017 or 02/2017. 4.  Encourage smoking cessation. 5.  Rx:  Salivamax 1 packet in 1 oz water rinse 4 times a day. 6.  RTC in 6 months for MD assessment and labs (CBC with diff, CMP).   Lequita Asal, MD  01/14/2017, 9:10 AM

## 2017-01-19 ENCOUNTER — Telehealth: Payer: Self-pay | Admitting: *Deleted

## 2017-01-19 NOTE — Telephone Encounter (Signed)
Called patient and left message that her saliva max would be covered by the foundation and she should be hearing from her pharmacy and to call back with questions.

## 2017-02-08 ENCOUNTER — Encounter: Payer: Self-pay | Admitting: Radiation Oncology

## 2017-02-08 ENCOUNTER — Ambulatory Visit
Admission: RE | Admit: 2017-02-08 | Discharge: 2017-02-08 | Disposition: A | Discharge status: Home / Self Care | Payer: Medicare PPO | Source: Ambulatory Visit | Attending: Radiation Oncology | Admitting: Radiation Oncology

## 2017-02-08 VITALS — BP 150/73 | HR 72 | Temp 96.0°F | Resp 20 | Wt 151.0 lb

## 2017-02-08 DIAGNOSIS — C319 Malignant neoplasm of accessory sinus, unspecified: Secondary | ICD-10-CM

## 2017-02-08 DIAGNOSIS — Z9221 Personal history of antineoplastic chemotherapy: Secondary | ICD-10-CM | POA: Insufficient documentation

## 2017-02-08 DIAGNOSIS — Z8522 Personal history of malignant neoplasm of nasal cavities, middle ear, and accessory sinuses: Secondary | ICD-10-CM | POA: Insufficient documentation

## 2017-02-08 DIAGNOSIS — C300 Malignant neoplasm of nasal cavity: Secondary | ICD-10-CM

## 2017-02-08 DIAGNOSIS — Z923 Personal history of irradiation: Secondary | ICD-10-CM | POA: Diagnosis not present

## 2017-02-08 DIAGNOSIS — B370 Candidal stomatitis: Secondary | ICD-10-CM | POA: Insufficient documentation

## 2017-02-08 NOTE — Progress Notes (Signed)
Radiation Oncology Follow up Note  Name: Heather Beasley   Date:   02/08/2017 MRN:  [id number] DOB: December 07, 1955    This 61 y.o. 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 She seen today in routine follow-up and is doing well her major problem is recurrent oral candidiasis she is on Diflucan for that and oral rinses. She recently had a PET CT scan showing no evidence of disease. She specifically denies dysphagia or head and neck pain..  COMPLICATIONS OF TREATMENT: none  FOLLOW UP COMPLIANCE: keeps appointments   PHYSICAL EXAM:  BP (!) 150/73   Pulse 72   Temp (!) 96 F (35.6 C)   Resp 20   Wt 151 lb 0.2 oz (68.5 kg)   BMI 25.13 kg/m  Oral cavity shows mild oral candidiasis. Upper airways clear vallecula and base of tongue within normal limits neck is clear without evidence of subject gastric cervical or supraclavicular adenopathy. Well-developed well-nourished patient in NAD. HEENT reveals PERLA, EOMI, discs not visualized.  Oral cavity is clear. No oral mucosal lesions are identified. Neck is clear without evidence of cervical or supraclavicular adenopathy. Lungs are clear to A&P. Cardiac examination is essentially unremarkable with regular rate and rhythm without murmur rub or thrill. Abdomen is benign with no organomegaly or masses noted. Motor sensory and DTR levels are equal and symmetric in the upper and lower extremities. Cranial nerves II through XII are grossly intact. Proprioception is intact. No peripheral adenopathy or edema is identified. No motor or sensory levels are noted. Crude visual fields are within normal range  .RADIOLOGY RESULTS: PET scan is  reviewed and compatible with the above-stated findings  PLAN: Present time patient is doing well with no evidence of disease. I'm please were overall progress. She continues close follow-up care with ENT. I have asked to see her back in 1 year for follow-up. She knows to call sooner with any concerns.  I would like to take this opportunity to thank you for allowing me to participate in the care of your patient.Armstead Peaks., MD

## 2017-02-17 DIAGNOSIS — H61002 Unspecified perichondritis of left external ear: Secondary | ICD-10-CM | POA: Diagnosis not present

## 2017-03-01 ENCOUNTER — Encounter: Payer: Self-pay | Admitting: Hematology and Oncology

## 2017-03-30 DIAGNOSIS — D481 Neoplasm of uncertain behavior of connective and other soft tissue: Secondary | ICD-10-CM | POA: Diagnosis not present

## 2017-03-30 DIAGNOSIS — C300 Malignant neoplasm of nasal cavity: Secondary | ICD-10-CM | POA: Diagnosis not present

## 2017-05-20 DIAGNOSIS — D1800 Hemangioma unspecified site: Secondary | ICD-10-CM | POA: Diagnosis not present

## 2017-05-20 DIAGNOSIS — D229 Melanocytic nevi, unspecified: Secondary | ICD-10-CM | POA: Diagnosis not present

## 2017-05-20 DIAGNOSIS — L820 Inflamed seborrheic keratosis: Secondary | ICD-10-CM | POA: Diagnosis not present

## 2017-05-20 DIAGNOSIS — Z85820 Personal history of malignant melanoma of skin: Secondary | ICD-10-CM | POA: Diagnosis not present

## 2017-05-20 DIAGNOSIS — H61002 Unspecified perichondritis of left external ear: Secondary | ICD-10-CM | POA: Diagnosis not present

## 2017-05-28 ENCOUNTER — Ambulatory Visit (INDEPENDENT_AMBULATORY_CARE_PROVIDER_SITE_OTHER): Payer: Medicare PPO | Admitting: Family Medicine

## 2017-05-28 ENCOUNTER — Encounter: Payer: Self-pay | Admitting: Family Medicine

## 2017-05-28 VITALS — BP 110/76 | HR 70 | Temp 97.8°F | Ht 65.0 in | Wt 148.0 lb

## 2017-05-28 DIAGNOSIS — R21 Rash and other nonspecific skin eruption: Secondary | ICD-10-CM | POA: Diagnosis not present

## 2017-05-28 MED ORDER — PREDNISONE 20 MG PO TABS
ORAL_TABLET | ORAL | 0 refills | Status: DC
Start: 2017-05-28 — End: 2017-07-19

## 2017-05-28 MED ORDER — PERMETHRIN 5 % EX CREA: 1.0000 "application " | TOPICAL_CREAM | Freq: Once | CUTANEOUS | 0 refills | Status: AC

## 2017-05-28 NOTE — Progress Notes (Signed)
BP 110/76 (BP Location: Left Arm, Patient Position: Sitting)   Pulse 70   Temp 97.8 F (36.6 C) (Oral)   Ht 5\' 5"  (1.651 m)   Wt 148 lb (67.1 kg)   SpO2 98%   BMI 24.63 kg/m    CC: rash Subjective:    Patient ID: Heather Beasley, female    DOB: 01/25/1956, 61 y.o.   MRN: [id number]  HPI: Heather Beasley is a 61 y.o. female presenting on 05/28/2017 for Rash (on both lower arms and legs, pt states rash does itch, pt states she has no been in any posion oaks,pt states she has not used any new products she has tried taking some zyrtec and benedryl, pt states she first noticed the rash 2 weeks ago )   2wk h/o intensely pruritic rash on bilateral arms and legs, even on R upper neck.  No oral lesions, fevers/chills, nausea, new joint pains, tick bites.   No new lotions, detergents, soaps or shampoos. No new foods or medicine.  Denies poison ivy exposure.  No other itchy rash at home (husband, grandchildren).  Treating with zyrtec and benadryl cream.   Relevant past medical, surgical, family and social history reviewed and updated as indicated. Interim medical history since our last visit reviewed. Allergies and medications reviewed and updated. Outpatient Medications Prior to Visit  Medication Sig Dispense Refill  . albuterol (PROVENTIL HFA;VENTOLIN HFA) 108 (90 BASE) MCG/ACT inhaler Inhale 2 puffs into the lungs every 6 (six) hours as needed. 18 g 2  . Artificial Saliva (SALIVAMAX) PACK Dissolve 1 packet in 1 oz of water and rinse by mouth 4-8 times per day  98  . aspirin EC 81 MG tablet Take 81 mg by mouth daily.    . cetirizine (ZYRTEC) 10 MG tablet Take 10 mg by mouth daily. Allergies.    . clotrimazole (MYCELEX) 10 MG troche Take 10 mg by mouth 3 (three) times daily.    . cyclobenzaprine (FLEXERIL) 10 MG tablet Take 1 tablet (10 mg total) by mouth 3 (three) times daily. 90 tablet 6  . loperamide (IMODIUM A-D) 2 MG tablet Take 2 mg by mouth 4 (four) times daily as needed for  diarrhea or loose stools.    . mometasone (ELOCON) 0.1 % cream     . Multiple Vitamins-Minerals (CENTRUM ADULTS PO) Take 1 tablet by mouth.    . pantoprazole (PROTONIX) 40 MG tablet Take 1 tablet (40 mg total) by mouth 2 (two) times daily. 60 tablet 11  . fluconazole (DIFLUCAN) 200 MG tablet      No facility-administered medications prior to visit.      Per HPI unless specifically indicated in ROS section below Review of Systems     Objective:    BP 110/76 (BP Location: Left Arm, Patient Position: Sitting)   Pulse 70   Temp 97.8 F (36.6 C) (Oral)   Ht 5\' 5"  (1.651 m)   Wt 148 lb (67.1 kg)   SpO2 98%   BMI 24.63 kg/m   Wt Readings from Last 3 Encounters:  05/28/17 148 lb (67.1 kg)  02/08/17 151 lb 0.2 oz (68.5 kg)  01/14/17 150 lb 1 oz (68.1 kg)    Physical Exam  Constitutional: She appears well-developed and well-nourished. No distress.  Musculoskeletal: She exhibits no edema.  Skin: Skin is warm and dry. Rash noted. No erythema.  Highly pruritic erythematous papules some in linear distribution throughout extremities, sparing face.  Excoriations Rough patch R upper neck  Psychiatric:  She has a normal mood and affect.  Nursing note and vitals reviewed.  Lab Results  Component Value Date   WBC 3.4 (L) 01/14/2017   HGB 14.4 01/14/2017   HCT 42.9 01/14/2017   MCV 95.7 01/14/2017   PLT 229 01/14/2017       Assessment & Plan:   Problem List Items Addressed This Visit    Skin rash - Primary    Given associated pruritis anticipate scabies. Rx permethrin 5% lotion.  Consider persistent poison ivy contact dermatitis - provided with WASP for prednisone taper to fill if no better.  Discussed further supportive care, ok to use calamine lotion.           Follow up plan: Return if symptoms worsen or fail to improve.  Ria Bush, MD

## 2017-05-28 NOTE — Assessment & Plan Note (Addendum)
Given associated pruritis anticipate scabies. Rx permethrin 5% lotion.  Consider persistent poison ivy contact dermatitis - provided with WASP for prednisone taper to fill if no better.  Discussed further supportive care, ok to use calamine lotion.

## 2017-05-28 NOTE — Patient Instructions (Addendum)
I think this is scabies - treat with permethrin cream through body.  If not better with treatment, may take steroid course.  May use calamine lotion as well.   Scabies, Adult Scabies is a skin condition that happens when very small insects get under the skin (infestation). This causes a rash and severe itchiness. Scabies can spread from person to person (is contagious). If you get scabies, it is common for others in your household to get scabies too. With proper treatment, symptoms usually go away in 2-4 weeks. Scabies usually does not cause lasting problems. What are the causes? This condition is caused by mites (Sarcoptes scabiei, or human itch mites) that can only be seen with a microscope. The mites get into the top layer of skin and lay eggs. Scabies can spread from person to person through:  Close contact with a person who has scabies.  Contact with infested items, such as towels, bedding, or clothing.  What increases the risk? This condition is more likely to develop in:  People who live in nursing homes and other extended-care facilities.  People who have sexual contact with a partner who has scabies.  Young children who attend child care facilities.  People who care for others who are at increased risk for scabies.  What are the signs or symptoms? Symptoms of this condition may include:  Severe itchiness. This is often worse at night.  A rash that includes tiny red bumps or blisters. The rash commonly occurs on the wrist, elbow, armpit, fingers, waist, groin, or buttocks. Bumps may form a line (burrow) in some areas.  Skin irritation. This can include scaly patches or sores.  How is this diagnosed? This condition is diagnosed with a physical exam. Your health care provider will look closely at your skin. In some cases, your health care provider may take a sample of your affected skin (skin scraping) and have it examined under a microscope. How is this treated? This  condition may be treated with:  Medicated cream or lotion that kills the mites. This is spread on the entire body and left on for several hours. Usually, one treatment with medicated cream or lotion is enough to kill all of the mites. In severe cases, the treatment may be repeated.  Medicated cream that relieves itching.  Medicines that help to relieve itching.  Medicines that kill the mites. This treatment is rarely used.  Follow these instructions at home:  Medicines  Take or apply over-the-counter and prescription medicines as told by your health care provider.  Apply medicated cream or lotion as told by your health care provider.  Do not wash off the medicated cream or lotion until the necessary amount of time has passed. Skin Care  Avoid scratching your affected skin.  Keep your fingernails closely trimmed to reduce injury from scratching.  Take cool baths or apply cool washcloths to help reduce itching. General instructions  Clean all items that you recently had contact with, including bedding, clothing, and furniture. Do this on the same day that your treatment starts. ? Use hot water when you wash items. ? Place unwashable items into closed, airtight plastic bags for at least 3 days. The mites cannot live for more than 3 days away from human skin. ? Vacuum furniture and mattresses that you use.  Make sure that other people who may have been infested are examined by a health care provider. These include members of your household and anyone who may have had contact with infested  items.  Keep all follow-up visits as told by your health care provider. This is important. Contact a health care provider if:  You have itching that does not go away after 4 weeks of treatment.  You continue to develop new bumps or burrows.  You have redness, swelling, or pain in your rash area after treatment.  You have fluid, blood, or pus coming from your rash. This information is not  intended to replace advice given to you by your health care provider. Make sure you discuss any questions you have with your health care provider. Document Released: 07/03/2015 Document Revised: 03/19/2016 Document Reviewed: 05/14/2015 Elsevier Interactive Patient Education  [employer].

## 2017-07-19 ENCOUNTER — Inpatient Hospital Stay (HOSPITAL_BASED_OUTPATIENT_CLINIC_OR_DEPARTMENT_OTHER): Payer: Medicare PPO | Admitting: Hematology and Oncology

## 2017-07-19 ENCOUNTER — Inpatient Hospital Stay: Payer: Medicare PPO | Attending: Hematology and Oncology

## 2017-07-19 ENCOUNTER — Other Ambulatory Visit: Payer: Self-pay | Admitting: *Deleted

## 2017-07-19 VITALS — BP 129/82 | HR 69 | Temp 94.9°F | Resp 18 | Wt 148.7 lb

## 2017-07-19 DIAGNOSIS — Z923 Personal history of irradiation: Secondary | ICD-10-CM

## 2017-07-19 DIAGNOSIS — Z8719 Personal history of other diseases of the digestive system: Secondary | ICD-10-CM | POA: Diagnosis not present

## 2017-07-19 DIAGNOSIS — Z79899 Other long term (current) drug therapy: Secondary | ICD-10-CM

## 2017-07-19 DIAGNOSIS — C300 Malignant neoplasm of nasal cavity: Secondary | ICD-10-CM

## 2017-07-19 DIAGNOSIS — K117 Disturbances of salivary secretion: Secondary | ICD-10-CM | POA: Insufficient documentation

## 2017-07-19 DIAGNOSIS — Z800 Family history of malignant neoplasm of digestive organs: Secondary | ICD-10-CM | POA: Insufficient documentation

## 2017-07-19 DIAGNOSIS — Z7982 Long term (current) use of aspirin: Secondary | ICD-10-CM

## 2017-07-19 DIAGNOSIS — R918 Other nonspecific abnormal finding of lung field: Secondary | ICD-10-CM | POA: Diagnosis not present

## 2017-07-19 DIAGNOSIS — E785 Hyperlipidemia, unspecified: Secondary | ICD-10-CM | POA: Insufficient documentation

## 2017-07-19 DIAGNOSIS — Y842 Radiological procedure and radiotherapy as the cause of abnormal reaction of the patient, or of later complication, without mention of misadventure at the time of the procedure: Secondary | ICD-10-CM

## 2017-07-19 DIAGNOSIS — I10 Essential (primary) hypertension: Secondary | ICD-10-CM | POA: Diagnosis not present

## 2017-07-19 DIAGNOSIS — M47892 Other spondylosis, cervical region: Secondary | ICD-10-CM

## 2017-07-19 DIAGNOSIS — Z87442 Personal history of urinary calculi: Secondary | ICD-10-CM | POA: Insufficient documentation

## 2017-07-19 DIAGNOSIS — K029 Dental caries, unspecified: Secondary | ICD-10-CM

## 2017-07-19 DIAGNOSIS — Z86010 Personal history of colonic polyps: Secondary | ICD-10-CM | POA: Insufficient documentation

## 2017-07-19 DIAGNOSIS — Z803 Family history of malignant neoplasm of breast: Secondary | ICD-10-CM | POA: Insufficient documentation

## 2017-07-19 DIAGNOSIS — F329 Major depressive disorder, single episode, unspecified: Secondary | ICD-10-CM

## 2017-07-19 DIAGNOSIS — G629 Polyneuropathy, unspecified: Secondary | ICD-10-CM | POA: Insufficient documentation

## 2017-07-19 DIAGNOSIS — K589 Irritable bowel syndrome without diarrhea: Secondary | ICD-10-CM | POA: Insufficient documentation

## 2017-07-19 DIAGNOSIS — C319 Malignant neoplasm of accessory sinus, unspecified: Secondary | ICD-10-CM

## 2017-07-19 DIAGNOSIS — Z85820 Personal history of malignant melanoma of skin: Secondary | ICD-10-CM | POA: Diagnosis not present

## 2017-07-19 DIAGNOSIS — F17210 Nicotine dependence, cigarettes, uncomplicated: Secondary | ICD-10-CM

## 2017-07-19 DIAGNOSIS — J45909 Unspecified asthma, uncomplicated: Secondary | ICD-10-CM

## 2017-07-19 DIAGNOSIS — K219 Gastro-esophageal reflux disease without esophagitis: Secondary | ICD-10-CM

## 2017-07-19 LAB — CBC WITH DIFFERENTIAL/PLATELET
Basophils Absolute: 0 10*3/uL (ref 0–0.1)
Basophils Relative: 0 %
Eosinophils Absolute: 0.1 10*3/uL (ref 0–0.7)
Eosinophils Relative: 3 %
HCT: 41.5 % (ref 35.0–47.0)
Hemoglobin: 14.2 g/dL (ref 12.0–16.0)
Lymphocytes Relative: 28 %
Lymphs Abs: 1.1 10*3/uL (ref 1.0–3.6)
MCH: 32.1 pg (ref 26.0–34.0)
MCHC: 34.2 g/dL (ref 32.0–36.0)
MCV: 93.8 fL (ref 80.0–100.0)
Monocytes Absolute: 0.3 10*3/uL (ref 0.2–0.9)
Monocytes Relative: 7 %
Neutro Abs: 2.4 10*3/uL (ref 1.4–6.5)
Neutrophils Relative %: 62 %
Platelets: 176 10*3/uL (ref 150–440)
RBC: 4.42 MIL/uL (ref 3.80–5.20)
RDW: 13.2 % (ref 11.5–14.5)
WBC: 3.9 10*3/uL (ref 3.6–11.0)

## 2017-07-19 LAB — COMPREHENSIVE METABOLIC PANEL
ALT: 10 U/L — ABNORMAL LOW (ref 14–54)
AST: 20 U/L (ref 15–41)
Albumin: 4.1 g/dL (ref 3.5–5.0)
Alkaline Phosphatase: 107 U/L (ref 38–126)
Anion gap: 6 (ref 5–15)
BUN: 12 mg/dL (ref 6–20)
CO2: 30 mmol/L (ref 22–32)
Calcium: 9.2 mg/dL (ref 8.9–10.3)
Chloride: 104 mmol/L (ref 101–111)
Creatinine, Ser: 0.77 mg/dL (ref 0.44–1.00)
GFR calc Af Amer: >60 mL/min (ref >60)
GFR calc non Af Amer: >60 mL/min (ref >60)
Glucose, Bld: 93 mg/dL (ref 65–99)
Potassium: 5.1 mmol/L (ref 3.5–5.1)
Sodium: 140 mmol/L (ref 135–145)
Total Bilirubin: 1.2 mg/dL (ref 0.3–1.2)
Total Protein: 6.9 g/dL (ref 6.5–8.1)

## 2017-07-19 NOTE — Progress Notes (Signed)
[facility] day:  07/19/2017  Chief Complaint: Heather Beasley is a 61 y.o. female with stage I right nasal cavity adenocarcinoma who is seen for 6 month assessment.  HPI:  The patient was last seen in the medical oncology clinic on 01/14/2017.  At that time, she was doing well.  She had xerostomia.  She had not stopped smoking.  PET scan revealed no evidence of recurrent disease.  She was scheduled to follow-up with Dr. Tami Ribas on 01/2017 or 02/2017. She is scheduled to see him again "next month".   She was seen by Dr. Baruch Gouty on 02/08/2017.  She had no evidence of disease.  During the interim, patient doing well. Previously reported xerostomia has been markedly improved using SalivaMax and peppermint oil. Patient denies other physical symptoms. She is eating well; "I am eating a lot of bananas"; no weight loss.     Past Medical History:  Diagnosis Date  . Allergic rhinitis   . Arthritis   . Asthma   . Cancer ([place])    melanoma  . Cancer of nasal cavity and sinus ([place]) 07/07/2015   radiation  . Cervical spondylosis   . Colitis   . Colon polyps   . Depression   . Diverticulosis   . GERD (gastroesophageal reflux disease)   . Glaucoma   . Headaches, cluster   . Hyperlipidemia   . Hypertension   . IBS (irritable bowel syndrome)   . Kidney stones   . Neuropathy   . PONV (postoperative nausea and vomiting)   . Tobacco abuse     Past Surgical History:  Procedure Laterality Date  . ABDOMINAL HYSTERECTOMY  1998  . APPENDECTOMY  1998  . ARM FX REPAIR  2001  . BACK SURGERY    . CERVICAL FUSION  2010  . CHOLECYSTECTOMY  08/2011   Dr Rochel Brome  . COLONOSCOPY     Dr Tiffany Kocher  . CYSTOSCOPY     Dr. Bernardo Heater  . DILATION AND CURETTAGE OF UTERUS    . ESOPHAGOGASTRODUODENOSCOPY  2012   Dr Tiffany Kocher  . FRACTURE SURGERY  2001   plate in right wrist and arm  . IMAGE GUIDED SINUS SURGERY N/A 06/25/2015   Procedure: IMAGE GUIDED SINUS  SURGERY;  Surgeon: Beverly Gust, MD;  Location: [facility] ORS;  Service: ENT;  Laterality: N/A;  . LUMBAR SPINE SURGERY  07/2004  . POLYPECTOMY N/A 06/25/2015   Procedure: POLYPECTOMY NASAL;  Surgeon: Beverly Gust, MD;  Location: [facility] ORS;  Service: ENT;  Laterality: N/A;  . [place]  . VAGINAL DELIVERY     x2    Family History  Problem Relation Age of Onset  . Hypertension Mother   . Hyperlipidemia Mother   . Diabetes Mother   . Colon cancer Father   . Pancreatic cancer Father   . Cancer Father        Colon & Pancreatic  . Breast cancer Paternal Grandmother 69  . Diabetes Paternal Grandmother   . Coronary artery disease Paternal Grandmother   . Heart failure Paternal Grandmother   . Cancer Paternal Grandmother        breast  . Stroke Brother     Social History:  reports that she has been smoking Cigarettes.  She has a 9.75 pack-year smoking history. She has never used smokeless tobacco. She reports that she does not drink alcohol or use drugs.  She smoked "40 some years".  Previously 1 1/2 pack a  day.  She currently smokes 4 cigarettes a day (trying to quit).  The patient is alone today.  Allergies:  Allergies  Allergen Reactions  . Albumin (Human) Rash  . Amoxicillin-Pot Clavulanate Hives  . Gabapentin Shortness Of Breath  . Latex Dermatitis and Rash  . Betadine [Povidone Iodine] Dermatitis, Rash and Other (See Comments)    Skin burning Topical betadine and iodine have this reaction with patient.  IV contrast is NOT a problem.  jkl  . Sucralfate     Other reaction(s): Other (See Comments) Abdominal Pain  . Amoxicillin     REACTION: rash  . Chantix [Varenicline Tartrate]     Heart racing  . Erythromycin     REACTION: rash  . Sucralfate Nausea And Vomiting    Heart racing Lightheaded     Current Medications: Current Outpatient Prescriptions  Medication Sig Dispense Refill  . Artificial Saliva (SALIVAMAX) PACK Dissolve 1 packet in 1 oz of water and  rinse by mouth 4-8 times per day  98  . aspirin EC 81 MG tablet Take 81 mg by mouth daily.    . cyclobenzaprine (FLEXERIL) 10 MG tablet Take 1 tablet (10 mg total) by mouth 3 (three) times daily. 90 tablet 6  . mometasone (ELOCON) 0.1 % cream     . pantoprazole (PROTONIX) 40 MG tablet Take 1 tablet (40 mg total) by mouth 2 (two) times daily. 60 tablet 11  . albuterol (PROVENTIL HFA;VENTOLIN HFA) 108 (90 BASE) MCG/ACT inhaler Inhale 2 puffs into the lungs every 6 (six) hours as needed. (Patient not taking: Reported on 07/19/2017) 18 g 2  . cetirizine (ZYRTEC) 10 MG tablet Take 10 mg by mouth daily. Allergies.    . clotrimazole (MYCELEX) 10 MG troche Take 10 mg by mouth 3 (three) times daily.    . furosemide (LASIX) 20 MG tablet furosemide 20 mg tablet    . loperamide (IMODIUM A-D) 2 MG tablet Take 2 mg by mouth 4 (four) times daily as needed for diarrhea or loose stools.    . Multiple Vitamins-Minerals (CENTRUM ADULTS PO) Take 1 tablet by mouth.     No current facility-administered medications for this visit.     Review of Systems:  GENERAL:  Feels "fine". No fevers or sweats.  Weight stable.  PERFORMANCE STATUS (ECOG):  1 HEENT:  Dry mouth; improving.  Teeth decaying.  No visual changes, runny nose, sore throat, mouth sores or tenderness. Lungs: No shortness of breath or cough.  No hemoptysis. Cardiac:  No chest pain, palpitations, orthopnea, or PND. GI:  No nausea, vomiting, diarrhea, constipation, melena or hematochezia. GU:  No urgency, frequency, dysuria, or hematuria. Musculoskeletal:  No back pain.  No joint pain.  No muscle tenderness. Extremities:  No pain or swelling. Skin:  No rashes or skin changes. Neuro:  No headache, numbness or weakness, balance or coordination issues. Endocrine:  No diabetes, thyroid issues, hot flashes or night sweats. Psych:  No mood changes, depression or anxiety. Pain:  No focal pain. Review of systems:  All other systems reviewed and found to be  negative.  Physical Exam: Blood pressure 129/82, pulse 69, temperature (!) 94.9 F (34.9 C), temperature source Tympanic, resp. rate 18, weight 148 lb 11.2 oz (67.4 kg). GENERAL:  Well developed, well nourished, woman sitting comfortably in the exam room in no acute distress. MENTAL STATUS:  Alert and oriented to person, place and time. HEAD:  Short brown hair.  Normocephalic, atraumatic, face symmetric, no Cushingoid features. EYES:  Glasses.  Blue eyes.  Pupils equal round and reactive to light and accomodation.  No conjunctivitis or scleral icterus. ENT:  Oropharynx clear without lesion.  No thrush.  Tongue normal.  Mucous membranes dry.  RESPIRATORY:  Clear to auscultation without rales, wheezes or rhonchi. CARDIOVASCULAR:  Regular rate and rhythm without murmur, rub or gallop. ABDOMEN:  Soft, non-tender, with active bowel sounds, and no hepatosplenomegaly.  No masses. SKIN:  Tan.  Bites nails.  No rashes, ulcers or lesions. EXTREMITIES: No edema, no skin discoloration or tenderness.  No palpable cords. LYMPH NODES: No palpable cervical, supraclavicular, axillary or inguinal adenopathy  NEUROLOGICAL: Unremarkable. PSYCH:  Appropriate.   Appointment on 07/19/2017  Component Date Value Ref Range Status  . Sodium 07/19/2017 140  135 - 145 mmol/L Final  . Potassium 07/19/2017 5.1  3.5 - 5.1 mmol/L Final  . Chloride 07/19/2017 104  101 - 111 mmol/L Final  . CO2 07/19/2017 30  22 - 32 mmol/L Final  . Glucose, Bld 07/19/2017 93  65 - 99 mg/dL Final  . BUN 07/19/2017 12  6 - 20 mg/dL Final  . Creatinine, Ser 07/19/2017 0.77  0.44 - 1.00 mg/dL Final  . Calcium 07/19/2017 9.2  8.9 - 10.3 mg/dL Final  . Total Protein 07/19/2017 6.9  6.5 - 8.1 g/dL Final  . Albumin 07/19/2017 4.1  3.5 - 5.0 g/dL Final  . AST 07/19/2017 20  15 - 41 U/L Final  . ALT 07/19/2017 10* 14 - 54 U/L Final  . Alkaline Phosphatase 07/19/2017 107  38 - 126 U/L Final  . Total Bilirubin 07/19/2017 1.2  0.3 - 1.2 mg/dL  Final  . GFR calc non Af Amer 07/19/2017 >60  >60 mL/min Final  . GFR calc Af Amer 07/19/2017 >60  >60 mL/min Final   Comment: (NOTE) The eGFR has been calculated using the CKD EPI equation. This calculation has not been validated in all clinical situations. eGFR's persistently <60 mL/min signify possible Chronic Kidney Disease.   . Anion gap 07/19/2017 6  5 - 15 Final  . WBC 07/19/2017 3.9  3.6 - 11.0 K/uL Final  . RBC 07/19/2017 4.42  3.80 - 5.20 MIL/uL Final  . Hemoglobin 07/19/2017 14.2  12.0 - 16.0 g/dL Final  . HCT 07/19/2017 41.5  35.0 - 47.0 % Final  . MCV 07/19/2017 93.8  80.0 - 100.0 fL Final  . MCH 07/19/2017 32.1  26.0 - 34.0 pg Final  . MCHC 07/19/2017 34.2  32.0 - 36.0 g/dL Final  . RDW 07/19/2017 13.2  11.5 - 14.5 % Final  . Platelets 07/19/2017 176  150 - 440 K/uL Final  . Neutrophils Relative % 07/19/2017 62  % Final  . Neutro Abs 07/19/2017 2.4  1.4 - 6.5 K/uL Final  . Lymphocytes Relative 07/19/2017 28  % Final  . Lymphs Abs 07/19/2017 1.1  1.0 - 3.6 K/uL Final  . Monocytes Relative 07/19/2017 7  % Final  . Monocytes Absolute 07/19/2017 0.3  0.2 - 0.9 K/uL Final  . Eosinophils Relative 07/19/2017 3  % Final  . Eosinophils Absolute 07/19/2017 0.1  0 - 0.7 K/uL Final  . Basophils Relative 07/19/2017 0  % Final  . Basophils Absolute 07/19/2017 0.0  0 - 0.1 K/uL Final    Assessment:  Heather Beasley is a 61 y.o. female with stage I right nasal cavity adenocarcinoma s/p  incisional biopsy of a polypoid mass in the right nasal cavity and paranasal sinuses on 06/25/2015.  Pathology revealed a 4 cm  adenocarcinoma with lymphovascular invasion. There was no perineural invasion. Pathologic stage was [id number].  Head MRI on 06/18/2015 revealed a polypoid 3 cm obstructing right nasal cavity mass.  PET scan on 07/05/2015 revealed a hypermetabolic lesion in the RIGHT ethmoid sinus consistent with biopsy-proven adenocarcinoma.  There was no evidence of local nodal metastasis in the  neck.  There was a 1 mm nodule in the left upper lobe.  There was no evidence distant metastasis.  She received radiation therapy to the involved field and lymph node from 06/2015 - 09/2015.  She has xerostomia.  Teeth are decaying.  TSH and T4 were normal on 07/27/2016.  PET scan on 01/07/2016 revealed resolution of prior hypermetabolic lesion in the right nasal cavity/ethmoid sinus. The right lower lobe nodular opacity on screening CT scan was no longer visualized.  There was no evidence of metastatic disease.  PET scan on 01/12/2017 revealed no metabolic evidence of local tumor recurrence in the nasal cavity/paranasal sinuses. There was no metabolic evidence of neck nodal or distant metastatic disease.  Symptomatically, she has xerostomia and dental decay.  She continues smoking cessation efforts. She has no adenopathy. Exam is stable. Labs unremarkable.   Plan: 1.  Labs today:  CBC with diff, CMP. 2.  Discuss ongoing follow-up with ENT and radiation oncology. 3.  Encourage smoking cessation. 4.  Continue SalivaMax and peppermint oil PRN xerostomia.  5.  RTC in 6 months for MD assessment and labs (CBC with diff, CMP).   Honor Loh, NP  07/19/2017, 10:56 AM   I saw and evaluated the patient, participating in the key portions of the service and reviewing pertinent diagnostic studies and records.  I reviewed the nurse practitioner's note and agree with the findings and the plan.  The assessment and plan were discussed with the patient.  A few questions were asked by the patient and answered.   Lequita Asal, MD 07/19/2017, 10:56 AM

## 2017-07-19 NOTE — Progress Notes (Signed)
Here for follow up

## 2017-07-24 ENCOUNTER — Encounter: Payer: Self-pay | Admitting: Hematology and Oncology

## 2017-08-08 ENCOUNTER — Encounter: Payer: Self-pay | Admitting: Emergency Medicine

## 2017-08-08 ENCOUNTER — Emergency Department
Admission: EM | Admit: 2017-08-08 | Discharge: 2017-08-08 | Disposition: A | Discharge status: Home / Self Care | Payer: Medicare PPO | Attending: Emergency Medicine | Admitting: Emergency Medicine

## 2017-08-08 DIAGNOSIS — F17210 Nicotine dependence, cigarettes, uncomplicated: Secondary | ICD-10-CM | POA: Diagnosis not present

## 2017-08-08 DIAGNOSIS — Z85820 Personal history of malignant melanoma of skin: Secondary | ICD-10-CM | POA: Insufficient documentation

## 2017-08-08 DIAGNOSIS — J45909 Unspecified asthma, uncomplicated: Secondary | ICD-10-CM | POA: Insufficient documentation

## 2017-08-08 DIAGNOSIS — R600 Localized edema: Secondary | ICD-10-CM | POA: Diagnosis present

## 2017-08-08 DIAGNOSIS — Z7982 Long term (current) use of aspirin: Secondary | ICD-10-CM | POA: Insufficient documentation

## 2017-08-08 DIAGNOSIS — J449 Chronic obstructive pulmonary disease, unspecified: Secondary | ICD-10-CM | POA: Diagnosis not present

## 2017-08-08 DIAGNOSIS — K047 Periapical abscess without sinus: Secondary | ICD-10-CM | POA: Diagnosis not present

## 2017-08-08 DIAGNOSIS — Z8522 Personal history of malignant neoplasm of nasal cavities, middle ear, and accessory sinuses: Secondary | ICD-10-CM | POA: Insufficient documentation

## 2017-08-08 DIAGNOSIS — Z79899 Other long term (current) drug therapy: Secondary | ICD-10-CM | POA: Diagnosis not present

## 2017-08-08 DIAGNOSIS — I10 Essential (primary) hypertension: Secondary | ICD-10-CM | POA: Insufficient documentation

## 2017-08-08 DIAGNOSIS — R220 Localized swelling, mass and lump, head: Secondary | ICD-10-CM

## 2017-08-08 LAB — CBC WITH DIFFERENTIAL/PLATELET
BASOS ABS: 0 10*3/uL (ref 0–0.1)
Basophils Relative: 0 %
EOS ABS: 0 10*3/uL (ref 0–0.7)
EOS PCT: 0 %
HCT: 44.3 % (ref 35.0–47.0)
HEMOGLOBIN: 14.7 g/dL (ref 12.0–16.0)
Lymphocytes Relative: 7 %
Lymphs Abs: 0.7 10*3/uL — ABNORMAL LOW (ref 1.0–3.6)
MCH: 31.5 pg (ref 26.0–34.0)
MCHC: 33.2 g/dL (ref 32.0–36.0)
MCV: 95 fL (ref 80.0–100.0)
Monocytes Absolute: 0.7 10*3/uL (ref 0.2–0.9)
Monocytes Relative: 7 %
NEUTROS PCT: 86 %
Neutro Abs: 8.7 10*3/uL — ABNORMAL HIGH (ref 1.4–6.5)
PLATELETS: 185 10*3/uL (ref 150–440)
RBC: 4.66 MIL/uL (ref 3.80–5.20)
RDW: 13.2 % (ref 11.5–14.5)
WBC: 10.1 10*3/uL (ref 3.6–11.0)

## 2017-08-08 LAB — BASIC METABOLIC PANEL
Anion gap: 9 (ref 5–15)
BUN: 14 mg/dL (ref 6–20)
CHLORIDE: 102 mmol/L (ref 101–111)
CO2: 27 mmol/L (ref 22–32)
CREATININE: 0.93 mg/dL (ref 0.44–1.00)
Calcium: 9.3 mg/dL (ref 8.9–10.3)
Glucose, Bld: 110 mg/dL — ABNORMAL HIGH (ref 65–99)
POTASSIUM: 4.1 mmol/L (ref 3.5–5.1)
SODIUM: 138 mmol/L (ref 135–145)

## 2017-08-08 MED ORDER — CEPHALEXIN 500 MG PO CAPS: 500.0000 mg | ORAL_CAPSULE | Freq: Three times a day (TID) | ORAL | 0 refills | Status: DC

## 2017-08-08 MED ORDER — VANCOMYCIN HCL IN DEXTROSE 1-5 GM/200ML-% IV SOLN
1000.0000 mg | Freq: Once | INTRAVENOUS | Status: AC
  Administered 2017-08-08: 1000 mg via INTRAVENOUS
  Filled 2017-08-08: qty 200

## 2017-08-08 NOTE — Discharge Instructions (Signed)
Call [facility] for appointment.  If the redness or swelling increases please return to the ER.  If you develop hives, itching, or swelling while taking the antibiotic then stop taking it and return to the ER or your pcp.  Stop taking the clindamycin.  Continue all regular medications

## 2017-08-08 NOTE — ED Provider Notes (Signed)
-----------------------------------------   10:01 AM on 08/08/2017 -----------------------------------------  I saw the patient with the physician assistant. Patient has 1-2 days of significant right facial swelling. History of radiation to the neck, likely leading to dental decay. Patient has poor dentition overall. Patient has moderate right facial swelling, does not extend below the submandibular line. The floor the mouth is soft. On exam there is no fluctuance, just induration. Patient appears well, no distress. No difficulty breathing or swallowing. We will check labs. I've evaluated the patient with a bedside ultrasound there is no drainable abscess, but there is signs of induration/cellulitis. We will treat with IV vancomycin while awaiting lab results. Patient has allergies to penicillin antibiotics as well as clindamycin which she took yesterday made her very dizzy and lightheaded per patient. We will reassess once labs have resulted. Patient denies any fever, nausea or vomiting.  patient's labs are normal including a normal white blood cell count. We'll discharge with antibiotics. Patient will follow up with her doctor.   Harvest Dark, MD 08/08/17 1051

## 2017-08-08 NOTE — ED Triage Notes (Signed)
Pt woke up yesterday with right sided facial swelling and pain, states she was seen in urgent care yesterday and given a shot. States they told her to come here if no better by today. Swelling noted to right side of face, pt has history of abscess.

## 2017-08-08 NOTE — ED Notes (Signed)
Spouse states her bp was low this am, 60/40, however bp stable at this time. Second bp 126/70.

## 2017-08-08 NOTE — ED Provider Notes (Signed)
[facility] Emergency Department Provider Note  ____________________________________________   First MD Initiated Contact with Patient 08/08/17 [phone]     (approximate)  I have reviewed the triage vital signs and the nursing notes.   HISTORY  Chief Complaint Facial Swelling    HPI Heather Beasley is a 61 y.o. female c/o of right-sided facial swelling. Symptoms started suddenly yesterday morning. [facility] and was given a shot and a prescription for clindamycin. States the clindamycin is making her dizzy, her blood pressure dropped to 90/60 this morning, and it makes her feel nauseated.denies fever or chills, chest pain or shortness of breath. Patient has hx of radiation to face 2 years ago.    Past Medical History:  Diagnosis Date  . Allergic rhinitis   . Arthritis   . Asthma   . Cancer ([place])    melanoma  . Cancer of nasal cavity and sinus ([place]) 07/07/2015   radiation  . Cervical spondylosis   . Colitis   . Colon polyps   . Depression   . Diverticulosis   . GERD (gastroesophageal reflux disease)   . Glaucoma   . Headaches, cluster   . Hyperlipidemia   . Hypertension   . IBS (irritable bowel syndrome)   . Kidney stones   . Neuropathy   . PONV (postoperative nausea and vomiting)   . Tobacco abuse     Patient Active Problem List   Diagnosis Date Noted  . Xerostomia due to radiotherapy 07/19/2017  . Skin rash 05/28/2017  . COPD (chronic obstructive pulmonary disease) ([place]) 12/01/2016  . Environmental allergies 12/01/2016  . History of melanoma 12/01/2016  . HLD (hyperlipidemia) 12/01/2016  . IBS (irritable bowel syndrome) 12/01/2016  . Cancer of nasal cavity and sinus ([place]) 07/07/2015  . Essential hypertension, benign 08/08/2013  . Chronic headaches 02/22/2012  . GERD (gastroesophageal reflux disease) 02/22/2012  . Depression 01/04/2012  . Asthma 09/14/2007    Past Surgical History:  Procedure  Laterality Date  . ABDOMINAL HYSTERECTOMY  1998  . APPENDECTOMY  1998  . ARM FX REPAIR  2001  . BACK SURGERY    . CERVICAL FUSION  2010  . CHOLECYSTECTOMY  08/2011   Dr Rochel Brome  . COLONOSCOPY     Dr Tiffany Kocher  . CYSTOSCOPY     Dr. Bernardo Heater  . DILATION AND CURETTAGE OF UTERUS    . ESOPHAGOGASTRODUODENOSCOPY  2012   Dr Tiffany Kocher  . FRACTURE SURGERY  2001   plate in right wrist and arm  . IMAGE GUIDED SINUS SURGERY N/A 06/25/2015   Procedure: IMAGE GUIDED SINUS SURGERY;  Surgeon: Beverly Gust, MD;  Location: [facility] ORS;  Service: ENT;  Laterality: N/A;  . LUMBAR SPINE SURGERY  07/2004  . POLYPECTOMY N/A 06/25/2015   Procedure: POLYPECTOMY NASAL;  Surgeon: Beverly Gust, MD;  Location: [facility] ORS;  Service: ENT;  Laterality: N/A;  . [place]  . VAGINAL DELIVERY     x2    Prior to Admission medications   Medication Sig Start Date End Date Taking? Authorizing Provider  albuterol (PROVENTIL HFA;VENTOLIN HFA) 108 (90 BASE) MCG/ACT inhaler Inhale 2 puffs into the lungs every 6 (six) hours as needed. Patient not taking: Reported on 07/19/2017 06/24/15   Jackolyn Confer, MD  Artificial Saliva [facility]) PACK Dissolve 1 packet in 1 oz of water and rinse by mouth 4-8 times per day 01/18/17   [provider]  aspirin EC 81 MG tablet  Take 81 mg by mouth daily.    [provider]  cephALEXin (KEFLEX) 500 MG capsule Take 1 capsule (500 mg total) by mouth 3 (three) times daily. 08/08/17   Fisher, Linden Dolin, PA-C  cetirizine (ZYRTEC) 10 MG tablet Take 10 mg by mouth daily. Allergies.    [provider]  clotrimazole (MYCELEX) 10 MG troche Take 10 mg by mouth 3 (three) times daily.    [provider]  cyclobenzaprine (FLEXERIL) 10 MG tablet Take 1 tablet (10 mg total) by mouth 3 (three) times daily. 11/29/15   Jackolyn Confer, MD  furosemide (LASIX) 20 MG tablet furosemide 20 mg tablet    [provider]  loperamide (IMODIUM A-D) 2 MG tablet Take  2 mg by mouth 4 (four) times daily as needed for diarrhea or loose stools.    [provider]  mometasone (ELOCON) 0.1 % cream  01/11/17   [provider]  Multiple Vitamins-Minerals (CENTRUM ADULTS PO) Take 1 tablet by mouth.    [provider]  pantoprazole (PROTONIX) 40 MG tablet Take 1 tablet (40 mg total) by mouth 2 (two) times daily. 11/14/15   Jackolyn Confer, MD    Allergies Albumin (human); Amoxicillin-pot clavulanate; Gabapentin; Latex; Betadine [povidone iodine]; Sucralfate; Amoxicillin; Chantix [varenicline tartrate]; Erythromycin; and Sucralfate  Family History  Problem Relation Age of Onset  . Hypertension Mother   . Hyperlipidemia Mother   . Diabetes Mother   . Colon cancer Father   . Pancreatic cancer Father   . Cancer Father        Colon & Pancreatic  . Breast cancer Paternal Grandmother 73  . Diabetes Paternal Grandmother   . Coronary artery disease Paternal Grandmother   . Heart failure Paternal Grandmother   . Cancer Paternal Grandmother        breast  . Stroke Brother     Social History Social History  Substance Use Topics  . Smoking status: Current Every Day Smoker    Packs/day: 0.25    Years: 39.00    Types: Cigarettes  . Smokeless tobacco: Never Used     Comment: e cigs currently--trying to quit  . Alcohol use No    Review of Systems  Constitutional: No fever/chills Eyes: No visual changes. ENT: No sore throat. Positive facial swelling at right jaw Respiratory: Denies cough Genitourinary: Negative for dysuria. Musculoskeletal: Negative for back pain. Skin: Negative for rash.    ____________________________________________   PHYSICAL EXAM:  VITAL SIGNS: ED Triage Vitals [08/08/17 0901]  Enc Vitals Group     BP 126/70     Pulse Rate (!) 101     Resp 18     Temp 97.8 F (36.6 C)     Temp Source Oral     SpO2 98 %     Weight 148 lb (67.1 kg)     Height '5\' 5"'  (1.651 m)     Head Circumference      Peak  Flow      Pain Score 5     Pain Loc      Pain Edu?      Excl. in [place]?     Constitutional: Alert and oriented. Well appearing and in no acute distress. Eyes: Conjunctivae are normal.  Head: Atraumatic. Nose: No congestion/rhinnorhea. Mouth/Throat: Mucous membranes are moist.  Swelling along the right lower gum area patient has poor dentition. Severe swelling in the soft tissue. Cardiovascular: Normal rate, regular rhythm. Respiratory: Normal respiratory effort.  No retractions GU:  deferred Musculoskeletal: FROM all extremities, warm and well perfused Neurologic:  Normal speech and language.  Skin:  Skin is warm, dry and intact. Some redness noted at the right jaw Psychiatric: Mood and affect are normal. Speech and behavior are normal.  ____________________________________________   LABS (all labs ordered are listed, but only abnormal results are displayed)  Labs Reviewed  BASIC METABOLIC PANEL - Abnormal; Notable for the following:       Result Value   Glucose, Bld 110 (*)    All other components within normal limits  CBC WITH DIFFERENTIAL/PLATELET - Abnormal; Notable for the following:    Neutro Abs 8.7 (*)    Lymphs Abs 0.7 (*)    All other components within normal limits   ____________________________________________   ____________________________________________  RADIOLOGY  none  ____________________________________________   PROCEDURES  Procedure(s) performed: ultrasound performed by Dr. Kerman Passey at bedside      ____________________________________________   INITIAL IMPRESSION / [place] / ED COURSE  Pertinent labs & imaging results that were available during my care of the patient were reviewed by me and considered in my medical decision making (see chart for details).  D75-year-old female with cellulitis and dental abscess. Patient is not tolerating oral clindamycin. We'll give IV  Vancomycin.  Labs for CBC and met c  were normal.  rx  for keflex 591m tid. Discussed discharge with dr PKerman Passey     ____________________________________________   FINAL CLINICAL IMPRESSION(S) / ED DIAGNOSES  Final diagnoses:  Dental abscess  Facial swelling      NEW MEDICATIONS STARTED DURING THIS VISIT:  New Prescriptions   CEPHALEXIN (KEFLEX) 500 MG CAPSULE    Take 1 capsule (500 mg total) by mouth 3 (three) times daily.     Note:  This document was prepared using Dragon voice recognition software and may include unintentional dictation errors.    FVersie Starks PA-C 08/08/17 1054    PHarvest Dark MD 08/08/17 1339

## 2017-10-27 ENCOUNTER — Other Ambulatory Visit: Payer: Self-pay | Admitting: Internal Medicine

## 2017-10-27 DIAGNOSIS — Z1231 Encounter for screening mammogram for malignant neoplasm of breast: Secondary | ICD-10-CM

## 2017-11-22 DIAGNOSIS — L853 Xerosis cutis: Secondary | ICD-10-CM | POA: Diagnosis not present

## 2017-11-22 DIAGNOSIS — Z1283 Encounter for screening for malignant neoplasm of skin: Secondary | ICD-10-CM | POA: Diagnosis not present

## 2017-11-22 DIAGNOSIS — Z85820 Personal history of malignant melanoma of skin: Secondary | ICD-10-CM | POA: Diagnosis not present

## 2017-11-22 DIAGNOSIS — D229 Melanocytic nevi, unspecified: Secondary | ICD-10-CM | POA: Diagnosis not present

## 2017-11-22 DIAGNOSIS — D1800 Hemangioma unspecified site: Secondary | ICD-10-CM | POA: Diagnosis not present

## 2017-11-22 DIAGNOSIS — L821 Other seborrheic keratosis: Secondary | ICD-10-CM | POA: Diagnosis not present

## 2017-11-22 DIAGNOSIS — L578 Other skin changes due to chronic exposure to nonionizing radiation: Secondary | ICD-10-CM | POA: Diagnosis not present

## 2017-11-22 DIAGNOSIS — L720 Epidermal cyst: Secondary | ICD-10-CM | POA: Diagnosis not present

## 2017-11-22 DIAGNOSIS — L812 Freckles: Secondary | ICD-10-CM | POA: Diagnosis not present

## 2018-01-06 ENCOUNTER — Telehealth: Payer: Self-pay | Admitting: *Deleted

## 2018-01-06 DIAGNOSIS — Z87891 Personal history of nicotine dependence: Secondary | ICD-10-CM

## 2018-01-06 DIAGNOSIS — Z122 Encounter for screening for malignant neoplasm of respiratory organs: Secondary | ICD-10-CM

## 2018-01-06 NOTE — Telephone Encounter (Signed)
Notified patient that annual lung cancer screening low dose CT scan is due currently or will be in near future. Confirmed that patient is within the age range of 55-77, and asymptomatic, (no signs or symptoms of lung cancer). Patient denies illness that would prevent curative treatment for lung cancer if found. Verified smoking history, (current, 43 pack year). The shared decision making visit was done 07/27/14. Patient is agreeable for CT scan being scheduled the 3rd week of April.

## 2018-01-06 NOTE — Telephone Encounter (Signed)
Left message for patient to notify them that it is time to schedule annual low dose lung cancer screening CT scan. Instructed patient to call back to verify information prior to the scan being scheduled.  

## 2018-01-17 ENCOUNTER — Ambulatory Visit: Payer: Medicare PPO | Admitting: Hematology and Oncology

## 2018-01-17 ENCOUNTER — Other Ambulatory Visit: Payer: Medicare PPO

## 2018-02-10 ENCOUNTER — Ambulatory Visit
Admission: RE | Admit: 2018-02-10 | Discharge: 2018-02-10 | Disposition: A | Discharge status: Home / Self Care | Payer: Medicare PPO | Source: Ambulatory Visit | Attending: Oncology | Admitting: Oncology

## 2018-02-10 ENCOUNTER — Ambulatory Visit: Payer: Medicare PPO | Admitting: Family Medicine

## 2018-02-10 ENCOUNTER — Encounter: Payer: Self-pay | Admitting: Family Medicine

## 2018-02-10 DIAGNOSIS — Z122 Encounter for screening for malignant neoplasm of respiratory organs: Secondary | ICD-10-CM | POA: Diagnosis not present

## 2018-02-10 DIAGNOSIS — Z87891 Personal history of nicotine dependence: Secondary | ICD-10-CM | POA: Diagnosis not present

## 2018-02-10 DIAGNOSIS — I700 Atherosclerosis of aorta: Secondary | ICD-10-CM | POA: Insufficient documentation

## 2018-02-10 DIAGNOSIS — H1013 Acute atopic conjunctivitis, bilateral: Secondary | ICD-10-CM | POA: Diagnosis not present

## 2018-02-10 DIAGNOSIS — F17210 Nicotine dependence, cigarettes, uncomplicated: Secondary | ICD-10-CM | POA: Diagnosis not present

## 2018-02-10 DIAGNOSIS — J432 Centrilobular emphysema: Secondary | ICD-10-CM | POA: Diagnosis not present

## 2018-02-10 DIAGNOSIS — I2510 Atherosclerotic heart disease of native coronary artery without angina pectoris: Secondary | ICD-10-CM | POA: Insufficient documentation

## 2018-02-10 DIAGNOSIS — J438 Other emphysema: Secondary | ICD-10-CM | POA: Insufficient documentation

## 2018-02-10 DIAGNOSIS — H1010 Acute atopic conjunctivitis, unspecified eye: Secondary | ICD-10-CM | POA: Insufficient documentation

## 2018-02-10 MED ORDER — OLOPATADINE HCL 0.2 % OP SOLN: 1.0000 [drp] | Freq: Every day | OPHTHALMIC | 1 refills | Status: DC

## 2018-02-10 NOTE — Assessment & Plan Note (Signed)
From pollen bilat-worse in R  Also dry eyes -worsens symptoms   Enc pollen avoidance when able  Art tears prn  pataday generic drops QD through the season  Disc s/s of bacterial and viral conj to watch for  Alert if vision change as well  Update if not starting to improve in a week or if worsening    Meds ordered this encounter  Medications  . Olopatadine HCl 0.2 % SOLN    Sig: Apply 1 drop to eye daily.    Dispense:  1 Bottle    Refill:  1

## 2018-02-10 NOTE — Patient Instructions (Signed)
I think you have allergy related eye symptoms (both eyes but a bit worse in the right)   Continue artificial tears as needed for dry eyes Zyrtec for itching   Try the generic pataday drops daily  If no improvement in the next week please let us know   If worse or swelling or colored eye discharge-alert [place]  If vision change -also

## 2018-02-10 NOTE — Progress Notes (Signed)
Subjective:    Patient ID: Heather Beasley, female    DOB: 06-May-1956, 62 y.o.   MRN: [id number]  HPI Here with symptoms of conjunctivitis   Allergies have been bothering her lately from pollen  A lot of sneezing last night   Today R eye is itchy and red  Other eye is not not bothering her  No colored d/c and not a lot of watering   Has not used any eye drops lately   No eyelid swelling  No uri or fever lately     She has dry eyes from prev radiotx - this keeps her eyes from watering   Patient Active Problem List   Diagnosis Date Noted  . Allergic conjunctivitis 02/10/2018  . Xerostomia due to radiotherapy 07/19/2017  . Skin rash 05/28/2017  . COPD (chronic obstructive pulmonary disease) ([place]) 12/01/2016  . Environmental allergies 12/01/2016  . History of melanoma 12/01/2016  . HLD (hyperlipidemia) 12/01/2016  . IBS (irritable bowel syndrome) 12/01/2016  . Cancer of nasal cavity and sinus ([place]) 07/07/2015  . Essential hypertension, benign 08/08/2013  . Chronic headaches 02/22/2012  . GERD (gastroesophageal reflux disease) 02/22/2012  . Depression 01/04/2012  . Asthma 09/14/2007   Past Medical History:  Diagnosis Date  . Allergic rhinitis   . Arthritis   . Asthma   . Cancer ([place])    melanoma  . Cancer of nasal cavity and sinus ([place]) 07/07/2015   radiation  . Cervical spondylosis   . Colitis   . Colon polyps   . Depression   . Diverticulosis   . GERD (gastroesophageal reflux disease)   . Glaucoma   . Headaches, cluster   . Hyperlipidemia   . Hypertension   . IBS (irritable bowel syndrome)   . Kidney stones   . Neuropathy   . PONV (postoperative nausea and vomiting)   . Tobacco abuse    Past Surgical History:  Procedure Laterality Date  . ABDOMINAL HYSTERECTOMY  1998  . APPENDECTOMY  1998  . ARM FX REPAIR  2001  . BACK SURGERY    . CERVICAL FUSION  2010  . CHOLECYSTECTOMY  08/2011   Dr Rochel Brome  . COLONOSCOPY     Dr Tiffany Kocher  . CYSTOSCOPY       Dr. Bernardo Heater  . DILATION AND CURETTAGE OF UTERUS    . ESOPHAGOGASTRODUODENOSCOPY  2012   Dr Tiffany Kocher  . FRACTURE SURGERY  2001   plate in right wrist and arm  . IMAGE GUIDED SINUS SURGERY N/A 06/25/2015   Procedure: IMAGE GUIDED SINUS SURGERY;  Surgeon: Beverly Gust, MD;  Location: [facility] ORS;  Service: ENT;  Laterality: N/A;  . LUMBAR SPINE SURGERY  07/2004  . POLYPECTOMY N/A 06/25/2015   Procedure: POLYPECTOMY NASAL;  Surgeon: Beverly Gust, MD;  Location: [facility] ORS;  Service: ENT;  Laterality: N/A;  . [place]  . VAGINAL DELIVERY     x2   Social History   Tobacco Use  . Smoking status: Current Every Day Smoker    Packs/day: 0.25    Years: 39.00    Pack years: 9.75    Types: Cigarettes  . Smokeless tobacco: Never Used  . Tobacco comment: e cigs currently--trying to quit  Substance Use Topics  . Alcohol use: No  . Drug use: No   Family History  Problem Relation Age of Onset  . Hypertension Mother   . Hyperlipidemia Mother   . Diabetes Mother   . Colon cancer Father   .  Pancreatic cancer Father   . Cancer Father        Colon & Pancreatic  . Breast cancer Paternal Grandmother 78  . Diabetes Paternal Grandmother   . Coronary artery disease Paternal Grandmother   . Heart failure Paternal Grandmother   . Cancer Paternal Grandmother        breast  . Stroke Brother    Allergies  Allergen Reactions  . Albumin (Human) Rash  . Amoxicillin-Pot Clavulanate Hives  . Gabapentin Shortness Of Breath  . Latex Dermatitis and Rash  . Betadine [Povidone Iodine] Dermatitis, Rash and Other (See Comments)    Skin burning Topical betadine and iodine have this reaction with patient.  IV contrast is NOT a problem.  jkl  . Sucralfate     Other reaction(s): Other (See Comments) Abdominal Pain  . Amoxicillin     REACTION: rash  . Chantix  [Varenicline Tartrate]   . Chantix [Varenicline Tartrate]     Heart racing  . Erythromycin     REACTION: rash  . Sucralfate Nausea  And Vomiting    Heart racing Lightheaded    Current Outpatient Medications on File Prior to Visit  Medication Sig Dispense Refill  . albuterol (PROVENTIL HFA;VENTOLIN HFA) 108 (90 BASE) MCG/ACT inhaler Inhale 2 puffs into the lungs every 6 (six) hours as needed. 18 g 2  . aspirin EC 81 MG tablet Take 81 mg by mouth daily.    . cetirizine (ZYRTEC) 10 MG tablet Take 10 mg by mouth daily. Allergies.    . cyclobenzaprine (FLEXERIL) 10 MG tablet Take 1 tablet (10 mg total) by mouth 3 (three) times daily. 90 tablet 6  . loperamide (IMODIUM A-D) 2 MG tablet Take 2 mg by mouth 4 (four) times daily as needed for diarrhea or loose stools.    . mometasone (ELOCON) 0.1 % cream     . Multiple Vitamins-Minerals (CENTRUM ADULTS PO) Take 1 tablet by mouth.    . pantoprazole (PROTONIX) 40 MG tablet Take 1 tablet (40 mg total) by mouth 2 (two) times daily. 60 tablet 11   No current facility-administered medications on file prior to visit.     Review of Systems  Constitutional: Negative for activity change, appetite change, fatigue, fever and unexpected weight change.  HENT: Negative for congestion, ear pain, rhinorrhea, sinus pressure and sore throat.   Eyes: Positive for redness and itching. Negative for photophobia, pain and visual disturbance.  Respiratory: Negative for cough, shortness of breath and wheezing.   Cardiovascular: Negative for chest pain and palpitations.  Gastrointestinal: Negative for abdominal pain, blood in stool, constipation and diarrhea.  Endocrine: Negative for polydipsia and polyuria.  Genitourinary: Negative for dysuria, frequency and urgency.  Musculoskeletal: Negative for arthralgias, back pain and myalgias.  Skin: Negative for pallor and rash.  Allergic/Immunologic: Negative for environmental allergies.  Neurological: Negative for dizziness, syncope and headaches.  Hematological: Negative for adenopathy. Does not bruise/bleed easily.  Psychiatric/Behavioral: Negative for  decreased concentration and dysphoric mood. The patient is not nervous/anxious.        Objective:   Physical Exam  Constitutional: She appears well-developed and well-nourished. No distress.  HENT:  Head: Normocephalic and atraumatic.  Right Ear: External ear normal.  Left Ear: External ear normal.  Mouth/Throat: Oropharynx is clear and moist.  Nares are injected and congested  - mildly / clear rhinorrhea No sinus tenderness  Eyes: Pupils are equal, round, and reactive to light. EOM are normal. Right eye exhibits no discharge. Left eye exhibits no  discharge. No scleral icterus.  Mildly injected conj bilat (slt worse on r medially) No swelling/lid change No d/c  Mildly dry eyes No fb seen  Grossly nl vision    Neck: Normal range of motion. Neck supple.  Lymphadenopathy:    She has no cervical adenopathy.          Assessment & Plan:   Problem List Items Addressed This Visit      Other   Allergic conjunctivitis    From pollen bilat-worse in R  Also dry eyes -worsens symptoms   Enc pollen avoidance when able  Art tears prn  pataday generic drops QD through the season  Disc s/s of bacterial and viral conj to watch for  Alert if vision change as well  Update if not starting to improve in a week or if worsening    Meds ordered this encounter  Medications  . Olopatadine HCl 0.2 % SOLN    Sig: Apply 1 drop to eye daily.    Dispense:  1 Bottle    Refill:  1

## 2018-02-14 ENCOUNTER — Inpatient Hospital Stay: Payer: Medicare PPO

## 2018-02-14 ENCOUNTER — Other Ambulatory Visit: Payer: Self-pay

## 2018-02-14 ENCOUNTER — Encounter: Payer: Self-pay | Admitting: *Deleted

## 2018-02-14 ENCOUNTER — Encounter: Payer: Self-pay | Admitting: Radiation Oncology

## 2018-02-14 ENCOUNTER — Ambulatory Visit
Admission: RE | Admit: 2018-02-14 | Discharge: 2018-02-14 | Disposition: A | Discharge status: Home / Self Care | Payer: Medicare PPO | Source: Ambulatory Visit | Attending: Radiation Oncology | Admitting: Radiation Oncology

## 2018-02-14 ENCOUNTER — Inpatient Hospital Stay: Payer: Medicare PPO | Attending: Hematology and Oncology | Admitting: Urgent Care

## 2018-02-14 ENCOUNTER — Other Ambulatory Visit: Payer: Self-pay | Admitting: *Deleted

## 2018-02-14 VITALS — BP 130/87 | HR 71 | Temp 95.7°F | Resp 18 | Wt 155.2 lb

## 2018-02-14 VITALS — BP 156/84 | HR 72 | Temp 97.5°F | Resp 18 | Wt 156.0 lb

## 2018-02-14 DIAGNOSIS — K117 Disturbances of salivary secretion: Secondary | ICD-10-CM | POA: Diagnosis not present

## 2018-02-14 DIAGNOSIS — C300 Malignant neoplasm of nasal cavity: Secondary | ICD-10-CM

## 2018-02-14 DIAGNOSIS — Z720 Tobacco use: Secondary | ICD-10-CM | POA: Diagnosis not present

## 2018-02-14 DIAGNOSIS — M8580 Other specified disorders of bone density and structure, unspecified site: Secondary | ICD-10-CM

## 2018-02-14 DIAGNOSIS — Z9221 Personal history of antineoplastic chemotherapy: Secondary | ICD-10-CM | POA: Diagnosis not present

## 2018-02-14 DIAGNOSIS — R911 Solitary pulmonary nodule: Secondary | ICD-10-CM | POA: Diagnosis not present

## 2018-02-14 DIAGNOSIS — Z8522 Personal history of malignant neoplasm of nasal cavities, middle ear, and accessory sinuses: Secondary | ICD-10-CM | POA: Insufficient documentation

## 2018-02-14 DIAGNOSIS — C319 Malignant neoplasm of accessory sinus, unspecified: Secondary | ICD-10-CM

## 2018-02-14 DIAGNOSIS — Y842 Radiological procedure and radiotherapy as the cause of abnormal reaction of the patient, or of later complication, without mention of misadventure at the time of the procedure: Secondary | ICD-10-CM

## 2018-02-14 DIAGNOSIS — R918 Other nonspecific abnormal finding of lung field: Secondary | ICD-10-CM

## 2018-02-14 DIAGNOSIS — Z923 Personal history of irradiation: Secondary | ICD-10-CM | POA: Insufficient documentation

## 2018-02-14 LAB — COMPREHENSIVE METABOLIC PANEL
ALT: 10 U/L — ABNORMAL LOW (ref 14–54)
AST: 20 U/L (ref 15–41)
Albumin: 3.9 g/dL (ref 3.5–5.0)
Alkaline Phosphatase: 105 U/L (ref 38–126)
Anion gap: 9 (ref 5–15)
BUN: 12 mg/dL (ref 6–20)
CO2: 27 mmol/L (ref 22–32)
Calcium: 9.4 mg/dL (ref 8.9–10.3)
Chloride: 104 mmol/L (ref 101–111)
Creatinine, Ser: 0.79 mg/dL (ref 0.44–1.00)
GFR calc Af Amer: >60 mL/min (ref >60)
GFR calc non Af Amer: >60 mL/min (ref >60)
Glucose, Bld: 110 mg/dL — ABNORMAL HIGH (ref 65–99)
Potassium: 4.6 mmol/L (ref 3.5–5.1)
Sodium: 140 mmol/L (ref 135–145)
Total Bilirubin: 0.6 mg/dL (ref 0.3–1.2)
Total Protein: 7 g/dL (ref 6.5–8.1)

## 2018-02-14 LAB — CBC WITH DIFFERENTIAL/PLATELET
Basophils Absolute: 0 10*3/uL (ref 0–0.1)
Basophils Relative: 0 %
Eosinophils Absolute: 0.1 10*3/uL (ref 0–0.7)
Eosinophils Relative: 3 %
HCT: 42.1 % (ref 35.0–47.0)
Hemoglobin: 14.2 g/dL (ref 12.0–16.0)
Lymphocytes Relative: 24 %
Lymphs Abs: 0.8 10*3/uL — ABNORMAL LOW (ref 1.0–3.6)
MCH: 31.9 pg (ref 26.0–34.0)
MCHC: 33.7 g/dL (ref 32.0–36.0)
MCV: 94.4 fL (ref 80.0–100.0)
Monocytes Absolute: 0.3 10*3/uL (ref 0.2–0.9)
Monocytes Relative: 8 %
Neutro Abs: 2.2 10*3/uL (ref 1.4–6.5)
Neutrophils Relative %: 65 %
Platelets: 236 10*3/uL (ref 150–440)
RBC: 4.46 MIL/uL (ref 3.80–5.20)
RDW: 13 % (ref 11.5–14.5)
WBC: 3.4 10*3/uL — ABNORMAL LOW (ref 3.6–11.0)

## 2018-02-14 NOTE — Progress Notes (Signed)
[facility] day:  02/14/2018  Chief Complaint: Heather Beasley is a 62 y.o. female with stage I right nasal cavity adenocarcinoma who is seen for 7 month assessment.  HPI:  The patient was last seen in the medical oncology clinic on 07/19/2017.  At that time, patient was doing well overall.  Xerostomia has improved using SalivaMax and peppermint oil.  Patient denied B symptoms.  Exam revealed dry mucous membranes and dental decay.  She had no new areas of adenopathy.  Labs unremarkable.  Patient was seen in the ED on 08/08/2017 for a 1-2-day history of significant right facial swelling.  [facility] day prior and given a shot of antibiotics and a prescription for clindamycin.  Dentition noted to be poor.  Labs demonstrated a WBC of 10.1 with an Elkins of 8700.  Chemistries unremarkable.  Bedside ultrasound was performed by Dr. Harvest Dark  that revealed no facial abscess.  Patient was treated for facial cellulitis with IV Vancomycin.  She was discharged home on a course of Keflex and instructions to follow-up with her dentist.  Patient had a low dose chest CT for lung cancer screening done on 02/10/2018 that demonstrated multiple tiny pulmonary nodules throughout the lungs bilaterally.  The largest nodule measured 3.2 mm.  There are multiple tiny calcified granulomas noted.  Lung-RADS 2S (benign appearance or behavior).  Follow-up low-dose chest CT suggested in one year.  Routine mammogram scheduled 02/21/2018.  Patient continues to follow-up with Dr. Tami Ribas (ENT) on a regular basis.  Her next scheduled follow-up appointment is in June 2019.  Patient was seen in follow-up consult by Dr. Baruch Gouty (radiation oncology) this morning.  She has been scheduled for a maxillofacial CT with contrast on 02/17/2018.  Patient is scheduled to follow-up with Dr. Baruch Gouty in 1 year.  In the interim, patient is approximately 6  weeks status post a total dental extraction.  She now has dentures, and notes that she has been having trouble adjusting.  Patient continues to experience significant xerostomia.  She is no longer using the prescribed SalivaMax citing that it "only helps for about 30 minutes".  Patient notes that she is unable to justify the cost of this medication for 30 minutes of benefit.  She continues to use peppermint oil mixed with water, which she notes "helps some".  Patient complains of skin changes to the base of her fingernails on the third, fourth, and fifth digits of her RIGHT hand.  Despite daily moisturizing, the aforementioned areas peel causing her to pick at the areas to the point of the bleeding.  She notes that the symptom has been going on since she finished chemotherapy.  She denies neuropathy.   Patient has experienced no B symptoms.  Recurrent infections she denies.  Patient is eating well.  Her weight has increased by 1 pound since her last visit to the clinic.  Patient denies pain today.  Past Medical History:  Diagnosis Date  . Allergic rhinitis   . Arthritis   . Asthma   . Cancer ([place])    melanoma  . Cancer of nasal cavity and sinus ([place]) 07/07/2015   radiation  . Cervical spondylosis   . Colitis   . Colon polyps   . Depression   . Diverticulosis   . GERD (gastroesophageal reflux disease)   . Glaucoma   . Headaches, cluster   . Hyperlipidemia   . Hypertension   .  IBS (irritable bowel syndrome)   . Kidney stones   . Neuropathy   . PONV (postoperative nausea and vomiting)   . Tobacco abuse     Past Surgical History:  Procedure Laterality Date  . ABDOMINAL HYSTERECTOMY  1998  . APPENDECTOMY  1998  . ARM FX REPAIR  2001  . BACK SURGERY    . CERVICAL FUSION  2010  . CHOLECYSTECTOMY  08/2011   Dr Rochel Brome  . COLONOSCOPY     Dr Tiffany Kocher  . CYSTOSCOPY     Dr. Bernardo Heater  . DILATION AND CURETTAGE OF UTERUS    . ESOPHAGOGASTRODUODENOSCOPY  2012   Dr Tiffany Kocher  .  FRACTURE SURGERY  2001   plate in right wrist and arm  . IMAGE GUIDED SINUS SURGERY N/A 06/25/2015   Procedure: IMAGE GUIDED SINUS SURGERY;  Surgeon: Beverly Gust, MD;  Location: [facility] ORS;  Service: ENT;  Laterality: N/A;  . LUMBAR SPINE SURGERY  07/2004  . POLYPECTOMY N/A 06/25/2015   Procedure: POLYPECTOMY NASAL;  Surgeon: Beverly Gust, MD;  Location: [facility] ORS;  Service: ENT;  Laterality: N/A;  . Dent  . VAGINAL DELIVERY     x2    Family History  Problem Relation Age of Onset  . Hypertension Mother   . Hyperlipidemia Mother   . Diabetes Mother   . Colon cancer Father   . Pancreatic cancer Father   . Cancer Father        Colon & Pancreatic  . Breast cancer Paternal Grandmother 24  . Diabetes Paternal Grandmother   . Coronary artery disease Paternal Grandmother   . Heart failure Paternal Grandmother   . Cancer Paternal Grandmother        breast  . Stroke Brother     Social History:  reports that she has been smoking cigarettes.  She has a 9.75 pack-year smoking history. She has never used smokeless tobacco. She reports that she does not drink alcohol or use drugs.  She smoked "40 some years".  Previously 1 1/2 pack a day.  She currently smokes 4 cigarettes a day (trying to quit).  The patient is alone today.  Allergies:  Allergies  Allergen Reactions  . Albumin (Human) Rash  . Amoxicillin-Pot Clavulanate Hives  . Gabapentin Shortness Of Breath  . Latex Dermatitis and Rash  . Betadine [Povidone Iodine] Dermatitis, Rash and Other (See Comments)    Skin burning Topical betadine and iodine have this reaction with patient.  IV contrast is NOT a problem.  jkl  . Sucralfate     Other reaction(s): Other (See Comments) Abdominal Pain  . Amoxicillin     REACTION: rash  . Chantix  [Varenicline Tartrate]   . Chantix [Varenicline Tartrate]     Heart racing  . Erythromycin     REACTION: rash  . Sucralfate Nausea And Vomiting    Heart racing Lightheaded      Current Medications: Current Outpatient Medications  Medication Sig Dispense Refill  . albuterol (PROVENTIL HFA;VENTOLIN HFA) 108 (90 BASE) MCG/ACT inhaler Inhale 2 puffs into the lungs every 6 (six) hours as needed. 18 g 2  . aspirin EC 81 MG tablet Take 81 mg by mouth daily.    . cetirizine (ZYRTEC) 10 MG tablet Take 10 mg by mouth daily. Allergies.    . cyclobenzaprine (FLEXERIL) 10 MG tablet Take 1 tablet (10 mg total) by mouth 3 (three) times daily. 90 tablet 6  . loperamide (IMODIUM A-D) 2 MG tablet Take 2 mg  by mouth 4 (four) times daily as needed for diarrhea or loose stools.    . mometasone (ELOCON) 0.1 % cream     . Multiple Vitamins-Minerals (CENTRUM ADULTS PO) Take 1 tablet by mouth.    . Olopatadine HCl 0.2 % SOLN Apply 1 drop to eye daily. 1 Bottle 1  . pantoprazole (PROTONIX) 40 MG tablet Take 1 tablet (40 mg total) by mouth 2 (two) times daily. 60 tablet 11   No current facility-administered medications for this visit.     Review of Systems:  GENERAL:  Feels "fine". No fevers or sweats.  Weight up 1 pound  PERFORMANCE STATUS (ECOG):  1 HEENT: Xerostomia; uses peppermint oil.  Recent full dental extraction; now edentulous with dentures.  No visual changes, runny nose, sore throat, mouth sores or tenderness. Lungs: No shortness of breath or cough.  No hemoptysis. Cardiac:  No chest pain, palpitations, orthopnea, or PND. GI:  No nausea, vomiting, diarrhea, constipation, melena or hematochezia. GU:  No urgency, frequency, dysuria, or hematuria. Musculoskeletal:  No back pain.  No joint pain.  No muscle tenderness. Extremities:  No pain or swelling. Skin:  Skin peeling and bleeding to base of cuticle on her 3rd-5th digits of RIGHT hand. No rashes or other skin changes. Neuro:  No headache, numbness or weakness, balance or coordination issues. Endocrine:  No diabetes, thyroid issues, hot flashes or night sweats. Psych:  No mood changes, depression or anxiety. Pain:  No  focal pain. Review of systems:  All other systems reviewed and found to be negative.  Physical Exam: Blood pressure 130/87, pulse 71, temperature (!) 95.7 F (35.4 C), temperature source Tympanic, resp. rate 18, weight 155 lb 4 oz (70.4 kg). GENERAL:  Well developed, well nourished, woman sitting comfortably in the exam room in no acute distress. MENTAL STATUS:  Alert and oriented to person, place and time. HEAD:  Short brown hair.  Normocephalic, atraumatic, face symmetric, no Cushingoid features. EYES:  Glasses.  Blue eyes.  Pupils equal round and reactive to light and accomodation.  No conjunctivitis or scleral icterus. ENT:  Oropharynx clear without lesion.  No thrush.  Tongue normal.  Mucous membranes dry.  RESPIRATORY:  Clear to auscultation without rales, wheezes or rhonchi. CARDIOVASCULAR:  Regular rate and rhythm without murmur, rub or gallop. ABDOMEN:  Soft, non-tender, with active bowel sounds, and no hepatosplenomegaly.  No masses. SKIN:  Tan.  Bites nails. Skin to finger on RIGHT hand (cuticle area) open/raw due to picking and bleeding. No rashes, ulcers or lesions. EXTREMITIES: No edema, no skin discoloration or tenderness.  No palpable cords. LYMPH NODES: No palpable cervical, supraclavicular, axillary or inguinal adenopathy  NEUROLOGICAL: Unremarkable. PSYCH:  Appropriate.   Appointment on 02/14/2018  Component Date Value Ref Range Status  . WBC 02/14/2018 3.4* 3.6 - 11.0 K/uL Final  . RBC 02/14/2018 4.46  3.80 - 5.20 MIL/uL Final  . Hemoglobin 02/14/2018 14.2  12.0 - 16.0 g/dL Final  . HCT 02/14/2018 42.1  35.0 - 47.0 % Final  . MCV 02/14/2018 94.4  80.0 - 100.0 fL Final  . MCH 02/14/2018 31.9  26.0 - 34.0 pg Final  . MCHC 02/14/2018 33.7  32.0 - 36.0 g/dL Final  . RDW 02/14/2018 13.0  11.5 - 14.5 % Final  . Platelets 02/14/2018 236  150 - 440 K/uL Final  . Neutrophils Relative % 02/14/2018 65  % Final  . Neutro Abs 02/14/2018 2.2  1.4 - 6.5 K/uL Final  .  Lymphocytes Relative 02/14/2018  24  % Final  . Lymphs Abs 02/14/2018 0.8* 1.0 - 3.6 K/uL Final  . Monocytes Relative 02/14/2018 8  % Final  . Monocytes Absolute 02/14/2018 0.3  0.2 - 0.9 K/uL Final  . Eosinophils Relative 02/14/2018 3  % Final  . Eosinophils Absolute 02/14/2018 0.1  0 - 0.7 K/uL Final  . Basophils Relative 02/14/2018 0  % Final  . Basophils Absolute 02/14/2018 0.0  0 - 0.1 K/uL Final   Performed at [facility], [address] [place], [place] [address]  . Sodium 02/14/2018 140  135 - 145 mmol/L Final  . Potassium 02/14/2018 4.6  3.5 - 5.1 mmol/L Final  . Chloride 02/14/2018 104  101 - 111 mmol/L Final  . CO2 02/14/2018 27  22 - 32 mmol/L Final  . Glucose, Bld 02/14/2018 110* 65 - 99 mg/dL Final  . BUN 02/14/2018 12  6 - 20 mg/dL Final  . Creatinine, Ser 02/14/2018 0.79  0.44 - 1.00 mg/dL Final  . Calcium 02/14/2018 9.4  8.9 - 10.3 mg/dL Final  . Total Protein 02/14/2018 7.0  6.5 - 8.1 g/dL Final  . Albumin 02/14/2018 3.9  3.5 - 5.0 g/dL Final  . AST 02/14/2018 20  15 - 41 U/L Final  . ALT 02/14/2018 10* 14 - 54 U/L Final  . Alkaline Phosphatase 02/14/2018 105  38 - 126 U/L Final  . Total Bilirubin 02/14/2018 0.6  0.3 - 1.2 mg/dL Final  . GFR calc non Af Amer 02/14/2018 >60  >60 mL/min Final  . GFR calc Af Amer 02/14/2018 >60  >60 mL/min Final   Comment: (NOTE) The eGFR has been calculated using the CKD EPI equation. This calculation has not been validated in all clinical situations. eGFR's persistently <60 mL/min signify possible Chronic Kidney Disease.   Georgiann Hahn gap 02/14/2018 9  5 - 15 Final   Performed at [facility], [address] [place], [place] [address]    Assessment:  Heather Beasley is a 62 y.o. female with stage I right nasal cavity adenocarcinoma s/p  incisional biopsy of a polypoid mass in the right nasal cavity and paranasal sinuses on 06/25/2015.  Pathology revealed a 4 cm adenocarcinoma with lymphovascular invasion. There was no  perineural invasion. Pathologic stage was [id number].  Head MRI on 06/18/2015 revealed a polypoid 3 cm obstructing right nasal cavity mass.  PET scan on 07/05/2015 revealed a hypermetabolic lesion in the RIGHT ethmoid sinus consistent with biopsy-proven adenocarcinoma.  There was no evidence of local nodal metastasis in the neck.  There was a 1 mm nodule in the left upper lobe.  There was no evidence distant metastasis.  She received radiation therapy to the involved field and lymph node from 06/2015 - 09/2015.  She has xerostomia.  Teeth are decaying.  TSH and T4 were normal on 07/27/2016.  PET scan on 01/07/2016 revealed resolution of prior hypermetabolic lesion in the right nasal cavity/ethmoid sinus. The right lower lobe nodular opacity on screening CT scan was no longer visualized.  There was no evidence of metastatic disease.  PET scan on 01/12/2017 revealed no metabolic evidence of local tumor recurrence in the nasal cavity/paranasal sinuses. There was no metabolic evidence of neck nodal or distant metastatic disease.  Low dose chest CT on 02/10/2018 that demonstrated multiple tiny pulmonary nodules throughout the lungs bilaterally.  The largest nodule measured 3.2 mm.  There are multiple tiny calcified granulomas noted.  Lung-RADS 2S (benign appearance or behavior).    Symptomatically, patient  is doing well.  She is recovering from a recent full dental extraction.  She is adjusting to her dentures.  She continues to have xerostomia, for which she is using peppermint oil only.  Patient continues to smoke cigarettes daily.  Exam reveals no new areas of adenopathy.  Labs stable.  Plan: 1.  Labs today:  CBC with diff, CMP. 2.  Review low dose chest CT. Multiple tiny pulmonary nodules throughout the lungs bilaterally. There are multiple tiny calcified granulomas noted.   3.  Discuss ongoing follow-up with ENT and radiation oncology.  Patient is scheduled for CT scan on 02/17/2018. 4.  Discuss smoking  cessation. Patient is actively trying to reduce the amount she is smoking. 5.  Encouraged to continue both SalivaMax and peppermint oil PRN xerostomia.  6.  Mammogram already scheduled for 02/21/2018 by PCP.  7.  Discuss osteopenia. Last bone density was done in 2014. Schedule bone density study.  8.  RTC in 6 months for MD assessment and labs (CBC with diff, CMP).   Honor Loh, NP  02/14/2018, 5:28 PM

## 2018-02-14 NOTE — Progress Notes (Signed)
Patient states she is eating but due to her radiation treatments her teeth were breaking off.  She went last week and had her teeth extracted and got dentures.  She is trying to get adjusted to them.  Otherwise, no complaints today.

## 2018-02-14 NOTE — Progress Notes (Signed)
Radiation Oncology Follow up Note  Name: Heather Beasley   Date:   02/14/2018 MRN:  [id number] DOB: 09/10/1956    This 62 y.o. 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  REFERRING PROVIDER: Jearld Fenton, NP  HPI: patient is a 62 year old female now out over 2 years having completed radiation therapy with concurrent chemotherapy for stage III squamous cell carcinoma the ethmoid sinus. She is seen today in routine follow-up is doing well. She specifically denies head and neck pain or dysphagia. She has no sinus congestion or epistaxis.Marland Kitchenshe had a PET scan a year ago which I have reviewed showing no evidence of hypermetabolic activity can possible with complete response. I have scheduled a follow-up CT scan the end of this month.  COMPLICATIONS OF TREATMENT: none  FOLLOW UP COMPLIANCE: keeps appointments   PHYSICAL EXAM:  BP (!) 156/84   Pulse 72   Temp (!) 97.5 F (36.4 C)   Resp 18   Wt 155 lb 15.6 oz (70.8 kg)   BMI 25.96 kg/m  Oral cavity is clear no oral mucosal lesions are identified. Indirect mirror examination shows upper airway clear vallecula and base of tongue within normal limits. Neck is clear without evidence of subject gastric cervical or supraclavicular adenopathy. Well-developed well-nourished patient in NAD. HEENT reveals PERLA, EOMI, discs not visualized.  Oral cavity is clear. No oral mucosal lesions are identified. Neck is clear without evidence of cervical or supraclavicular adenopathy. Lungs are clear to A&P. Cardiac examination is essentially unremarkable with regular rate and rhythm without murmur rub or thrill. Abdomen is benign with no organomegaly or masses noted. Motor sensory and DTR levels are equal and symmetric in the upper and lower extremities. Cranial nerves II through XII are grossly intact. Proprioception is intact. No peripheral adenopathy or  edema is identified. No motor or sensory levels are noted. Crude visual fields are within normal range.  RADIOLOGY RESULTS: prior PET/CT from year goes reviewed. Recent CT scan of the chest for screening is also reviewed showing no evidence of disease.  PLAN: present time patient continues to do well with no evidence of disease. As above I've ordered a CT scan of the head and neck the end of this month. I've asked to see her back in 1 year for follow-up. Patient continues to do well with no evidence of disease. She knows to call sooner with any concerns.  I would like to take this opportunity to thank you for allowing me to participate in the care of your patient.Noreene Filbert, MD

## 2018-02-17 ENCOUNTER — Ambulatory Visit
Admission: RE | Admit: 2018-02-17 | Discharge: 2018-02-17 | Disposition: A | Discharge status: Home / Self Care | Payer: Medicare PPO | Source: Ambulatory Visit | Attending: Radiation Oncology | Admitting: Radiation Oncology

## 2018-02-17 DIAGNOSIS — C319 Malignant neoplasm of accessory sinus, unspecified: Secondary | ICD-10-CM | POA: Diagnosis not present

## 2018-02-17 DIAGNOSIS — C300 Malignant neoplasm of nasal cavity: Secondary | ICD-10-CM | POA: Diagnosis not present

## 2018-02-17 DIAGNOSIS — Z8522 Personal history of malignant neoplasm of nasal cavities, middle ear, and accessory sinuses: Secondary | ICD-10-CM | POA: Diagnosis not present

## 2018-02-17 MED ORDER — IOHEXOL 300 MG/ML  SOLN
75.0000 mL | Freq: Once | INTRAMUSCULAR | Status: AC | PRN
  Administered 2018-02-17: 75 mL via INTRAVENOUS

## 2018-02-21 ENCOUNTER — Ambulatory Visit
Admission: RE | Admit: 2018-02-21 | Discharge: 2018-02-21 | Disposition: A | Discharge status: Home / Self Care | Payer: Medicare PPO | Source: Ambulatory Visit | Attending: Internal Medicine | Admitting: Internal Medicine

## 2018-02-21 DIAGNOSIS — Z1231 Encounter for screening mammogram for malignant neoplasm of breast: Secondary | ICD-10-CM | POA: Insufficient documentation

## 2018-02-24 ENCOUNTER — Other Ambulatory Visit: Payer: Self-pay | Admitting: *Deleted

## 2018-03-01 ENCOUNTER — Ambulatory Visit (INDEPENDENT_AMBULATORY_CARE_PROVIDER_SITE_OTHER): Payer: Medicare PPO | Admitting: Internal Medicine

## 2018-03-01 ENCOUNTER — Encounter: Payer: Self-pay | Admitting: Internal Medicine

## 2018-03-01 VITALS — BP 130/82 | HR 76 | Temp 98.1°F | Ht 64.75 in | Wt 156.0 lb

## 2018-03-01 DIAGNOSIS — J439 Emphysema, unspecified: Secondary | ICD-10-CM | POA: Diagnosis not present

## 2018-03-01 DIAGNOSIS — E559 Vitamin D deficiency, unspecified: Secondary | ICD-10-CM | POA: Diagnosis not present

## 2018-03-01 DIAGNOSIS — I10 Essential (primary) hypertension: Secondary | ICD-10-CM

## 2018-03-01 DIAGNOSIS — F329 Major depressive disorder, single episode, unspecified: Secondary | ICD-10-CM | POA: Diagnosis not present

## 2018-03-01 DIAGNOSIS — Z23 Encounter for immunization: Secondary | ICD-10-CM | POA: Diagnosis not present

## 2018-03-01 DIAGNOSIS — R51 Headache: Secondary | ICD-10-CM

## 2018-03-01 DIAGNOSIS — E7800 Pure hypercholesterolemia, unspecified: Secondary | ICD-10-CM | POA: Diagnosis not present

## 2018-03-01 DIAGNOSIS — Z0000 Encounter for general adult medical examination without abnormal findings: Secondary | ICD-10-CM | POA: Diagnosis not present

## 2018-03-01 DIAGNOSIS — K219 Gastro-esophageal reflux disease without esophagitis: Secondary | ICD-10-CM

## 2018-03-01 DIAGNOSIS — Z760 Encounter for issue of repeat prescription: Secondary | ICD-10-CM | POA: Diagnosis not present

## 2018-03-01 DIAGNOSIS — K580 Irritable bowel syndrome with diarrhea: Secondary | ICD-10-CM | POA: Diagnosis not present

## 2018-03-01 DIAGNOSIS — C300 Malignant neoplasm of nasal cavity: Secondary | ICD-10-CM | POA: Diagnosis not present

## 2018-03-01 DIAGNOSIS — M159 Polyosteoarthritis, unspecified: Secondary | ICD-10-CM

## 2018-03-01 DIAGNOSIS — M150 Primary generalized (osteo)arthritis: Secondary | ICD-10-CM

## 2018-03-01 DIAGNOSIS — Z9109 Other allergy status, other than to drugs and biological substances: Secondary | ICD-10-CM | POA: Diagnosis not present

## 2018-03-01 DIAGNOSIS — J4520 Mild intermittent asthma, uncomplicated: Secondary | ICD-10-CM

## 2018-03-01 DIAGNOSIS — G8929 Other chronic pain: Secondary | ICD-10-CM

## 2018-03-01 DIAGNOSIS — C319 Malignant neoplasm of accessory sinus, unspecified: Secondary | ICD-10-CM

## 2018-03-01 DIAGNOSIS — Z85820 Personal history of malignant melanoma of skin: Secondary | ICD-10-CM

## 2018-03-01 LAB — CBC
HCT: 42.5 % (ref 36.0–46.0)
Hemoglobin: 14.2 g/dL (ref 12.0–15.0)
MCHC: 33.5 g/dL (ref 30.0–36.0)
MCV: 95.4 fl (ref 78.0–100.0)
Platelets: 243 10*3/uL (ref 150.0–400.0)
RBC: 4.46 Mil/uL (ref 3.87–5.11)
RDW: 13.5 % (ref 11.5–15.5)
WBC: 4.9 10*3/uL (ref 4.0–10.5)

## 2018-03-01 LAB — COMPREHENSIVE METABOLIC PANEL
ALK PHOS: 97 U/L (ref 39–117)
ALT: 13 U/L (ref 0–35)
AST: 30 U/L (ref 0–37)
Albumin: 4.1 g/dL (ref 3.5–5.2)
BILIRUBIN TOTAL: 1 mg/dL (ref 0.2–1.2)
BUN: 11 mg/dL (ref 6–23)
CO2: 34 mEq/L — ABNORMAL HIGH (ref 19–32)
CREATININE: 0.81 mg/dL (ref 0.40–1.20)
Calcium: 9.7 mg/dL (ref 8.4–10.5)
Chloride: 103 mEq/L (ref 96–112)
GFR: 76.73 mL/min (ref >60.00)
GLUCOSE: 88 mg/dL (ref 70–99)
Potassium: 4.8 mEq/L (ref 3.5–5.1)
SODIUM: 140 meq/L (ref 135–145)
TOTAL PROTEIN: 7.1 g/dL (ref 6.0–8.3)

## 2018-03-01 LAB — LIPID PANEL
Cholesterol: 189 mg/dL (ref 0–200)
HDL: 58.1 mg/dL (ref >39.00)
LDL Cholesterol: 114 mg/dL — ABNORMAL HIGH (ref 0–99)
NonHDL: 131.12
Total CHOL/HDL Ratio: 3
Triglycerides: 85 mg/dL (ref 0.0–149.0)
VLDL: 17 mg/dL (ref 0.0–40.0)

## 2018-03-01 LAB — VITAMIN D 25 HYDROXY (VIT D DEFICIENCY, FRACTURES): VITD: 23.61 ng/mL — AB (ref 30.00–100.00)

## 2018-03-01 MED ORDER — PANTOPRAZOLE SODIUM 40 MG PO TBEC: 40.0000 mg | DELAYED_RELEASE_TABLET | Freq: Two times a day (BID) | ORAL | 11 refills | Status: DC

## 2018-03-01 NOTE — Assessment & Plan Note (Signed)
Encouraged her to quit smoking Continue albuterol as needed

## 2018-03-01 NOTE — Progress Notes (Signed)
HPI:  Pt presents to the clinic for her Medicare wellness exam.  She is also due to follow-up chronic conditions.  Environmental allergies: Worse in the spring.  She takes Zyrtec as needed with good relief.  She has followed with Dr. Tami Ribas in the past.  Asthma/COPD: She denies chronic cough or shortness of breath.  She uses an Albuterol inhaler as needed with good relief.  She does continue to smoke.  Arthritis: Mainly in her hands, back and knees.  She takes Flexeril as needed.  History of Melanoma: Excised off her left upper leg.  She follows with dermatology yearly.  Cancer Nasal Cavity/Sinus: Status post excision of polyp and radiation.  She follows with Dr. Donella Stade, Dr. Mike Gip, and Dr. Tami Ribas.  Depression: Currently not an issue.  She is not taking anything for depression at this time.  She denies SI/HI.  GERD: Triggered by spicy, greasy or tomato-based foods.  She takes Pantoprazole daily.  She denies breakthrough symptoms. She will need a refill of Pantoprazole today.  She follows with Dr. Vira Agar.  Frequent Headaches: These occur daily.  Located all over her head.  She describes the pain as throbbing and pounding.  She denies visual changes, dizziness, sensitivity to light or sound, nausea or vomiting.  She takes Tylenol and Excedrin Migraine with good relief.  HTN: Her BP today is 130/82.  She is currently not taking any antihypertensive medication at this time.  EKG from 05/2015 reviewed.  HLD: Her last LDL was 82, 11/2015.  She is not taking any cholesterol-lowering medication at this time.  She tries to consume a low-fat diet.  IBS: Mainly diarrhea.  She uses Imodium right ear daily.  Past Medical History:  Diagnosis Date  . Allergic rhinitis   . Arthritis   . Asthma   . Cancer ([place])    melanoma  . Cancer of nasal cavity and sinus ([place]) 07/07/2015   radiation  . Cervical spondylosis   . Colitis   . Colon polyps   . Depression   . Diverticulosis   . GERD  (gastroesophageal reflux disease)   . Glaucoma   . Headaches, cluster   . Hyperlipidemia   . IBS (irritable bowel syndrome)   . Kidney stones   . Neuropathy   . PONV (postoperative nausea and vomiting)   . Tobacco abuse     Current Outpatient Medications  Medication Sig Dispense Refill  . albuterol (PROVENTIL HFA;VENTOLIN HFA) 108 (90 BASE) MCG/ACT inhaler Inhale 2 puffs into the lungs every 6 (six) hours as needed. 18 g 2  . aspirin EC 81 MG tablet Take 81 mg by mouth daily.    . cetirizine (ZYRTEC) 10 MG tablet Take 10 mg by mouth daily. Allergies.    . cyclobenzaprine (FLEXERIL) 10 MG tablet Take 1 tablet (10 mg total) by mouth 3 (three) times daily. 90 tablet 6  . loperamide (IMODIUM A-D) 2 MG tablet Take 2 mg by mouth 4 (four) times daily as needed for diarrhea or loose stools.    . mometasone (ELOCON) 0.1 % cream     . Multiple Vitamins-Minerals (CENTRUM ADULTS PO) Take 1 tablet by mouth.    . Olopatadine HCl 0.2 % SOLN Apply 1 drop to eye daily. 1 Bottle 1  . pantoprazole (PROTONIX) 40 MG tablet Take 1 tablet (40 mg total) by mouth 2 (two) times daily. 60 tablet 11   No current facility-administered medications for this visit.     Allergies  Allergen Reactions  . Albumin (Human)  Rash  . Amoxicillin-Pot Clavulanate Hives  . Gabapentin Shortness Of Breath  . Latex Dermatitis and Rash  . Betadine [Povidone Iodine] Dermatitis, Rash and Other (See Comments)    Skin burning Topical betadine and iodine have this reaction with patient.  IV contrast is NOT a problem.  jkl  . Sucralfate     Other reaction(s): Other (See Comments) Abdominal Pain  . Amoxicillin     REACTION: rash  . Chantix  [Varenicline Tartrate]   . Chantix [Varenicline Tartrate]     Heart racing  . Erythromycin     REACTION: rash  . Sucralfate Nausea And Vomiting    Heart racing Lightheaded     Family History  Problem Relation Age of Onset  . Hypertension Mother   . Hyperlipidemia Mother   .  Diabetes Mother   . Colon cancer Father   . Pancreatic cancer Father   . Cancer Father        Colon & Pancreatic  . Breast cancer Paternal Grandmother 95  . Diabetes Paternal Grandmother   . Coronary artery disease Paternal Grandmother   . Heart failure Paternal Grandmother   . Cancer Paternal Grandmother        breast  . Stroke Brother     Social History   Socioeconomic History  . Marital status: Married    Spouse name: Not on file  . Number of children: 2  . Years of education: Not on file  . Highest education level: Not on file  Occupational History  . Occupation: Investment banker, operational  . Financial resource strain: Not on file  . Food insecurity:    Worry: Not on file    Inability: Not on file  . Transportation needs:    Medical: Not on file    Non-medical: Not on file  Tobacco Use  . Smoking status: Current Every Day Smoker    Packs/day: 0.25    Years: 39.00    Pack years: 9.75    Types: Cigarettes  . Smokeless tobacco: Never Used  . Tobacco comment: e cigs currently--trying to quit  Substance and Sexual Activity  . Alcohol use: No  . Drug use: No  . Sexual activity: Not Currently    Partners: Male  Lifestyle  . Physical activity:    Days per week: Not on file    Minutes per session: Not on file  . Stress: Not on file  Relationships  . Social connections:    Talks on phone: Not on file    Gets together: Not on file    Attends religious service: Not on file    Active member of club or organization: Not on file    Attends meetings of clubs or organizations: Not on file    Relationship status: Not on file  . Intimate partner violence:    Fear of current or ex partner: Not on file    Emotionally abused: Not on file    Physically abused: Not on file    Forced sexual activity: Not on file  Other Topics Concern  . Not on file  Social History Narrative   Lives in [place] with husband. Dog in home. Work - disabled for neck and back pain.     Hospitiliaztions: None  Health Maintenance:    Flu: 07/2017  Tetanus: ? 2010  Pneumovax: 2013  Prevnar: 06/2016  Mammogram: 01/2018  Pap Smear: 06/2014- total hysterectomy  Bone Density: 05/2013, scheduled 02/2018  Colon Screening: 02/2014, every 5 years  Eye  Doctor: as needed  Dental Exam: biannually   Providers:   PCP: [name], NP-C  Oncologist: Dr. Donella Stade, Dr. Wilmon Arms  ENT: Dr. Tami Ribas  Gastroenterologist: Dr. Tiffany Kocher  Dermatologist. Dr. Nehemiah Massed   I have personally reviewed and have noted:  1. The patient's medical and social history 2. Their use of alcohol, tobacco or illicit drugs 3. Their current medications and supplements 4. The patient's functional ability including ADL's, fall risks, home safety risks and hearing or visual impairment. 5. Diet and physical activities 6. Evidence for depression or mood disorder  Subjective:   Review of Systems:   Constitutional: Pt reports fatigue and daily headache. Denies fever, malaise or abrupt weight changes.  HEENT: Pt reports postnasal drip. Denies eye pain, eye redness, ear pain, ringing in the ears, wax buildup, runny nose, nasal congestion, bloody nose, or sore throat. Respiratory: Denies difficulty breathing, shortness of breath, or sputum production.   Cardiovascular: Denies chest pain, chest tightness, palpitations or swelling in the hands or feet.  Gastrointestinal: Pt reports diarrhea. Denies abdominal pain, bloating, constipation, or blood in the stool.  GU: Denies urgency, frequency, pain with urination, burning sensation, blood in urine, odor or discharge. Musculoskeletal: Pt presents intermittent joint pains. Denies decrease in range of motion, difficulty with gait, muscle pain or joint swelling.  Skin: Denies redness, rashes, lesions or ulcercations.  Neurological: Denies dizziness, difficulty with memory, difficulty with speech or problems with balance and coordination.  Psych: Denies anxiety,  depression, SI/HI.  No other specific complaints in a complete review of systems (except as listed in HPI above).  Objective:  PE:   BP 130/82   Pulse 76   Temp 98.1 F (36.7 C) (Oral)   Ht 5' 4.75" (1.645 m)   Wt 156 lb (70.8 kg)   SpO2 97%   BMI 26.16 kg/m   Wt Readings from Last 3 Encounters:  02/14/18 155 lb 15.6 oz (70.8 kg)  02/14/18 155 lb 4 oz (70.4 kg)  02/10/18 152 lb (68.9 kg)    General: Appears herstated age, well developed, well nourished in NAD. Skin: Warm, dry and intact.  HEENT: Head: normal shape and size; Eyes: sclera white, no icterus, conjunctiva pink, PERRLA and EOMs intact; Ears: Tm's gray and intact, normal light reflex; Throat/Mouth:  Mucosa pink and moist, no exudate, lesions or ulcerations noted.  Neck: Neck supple, trachea midline. No masses, lumps or thyromegaly present.  Cardiovascular: Normal rate and rhythm. S1,S2 noted.  No murmur, rubs or gallops noted. No JVD or BLE edema. No carotid bruits noted. Pulmonary/Chest: Normal effort and positive vesicular breath sounds. No respiratory distress. No wheezes, rales or ronchi noted.  Abdomen: Soft and nontender. Normal bowel sounds. No distention or masses noted. Liver, spleen and kidneys non palpable. Musculoskeletal: Strength 5/5 BUE/BLE. No signs of joint swelling.  Neurological: Alert and oriented. Cranial nerves II-XII grossly intact. Coordination normal.  Psychiatric: Mood and affect normal. Behavior is normal. Judgment and thought content normal.     BMET    Component Value Date/Time   NA 140 02/14/2018 0940   K 4.6 02/14/2018 0940   CL 104 02/14/2018 0940   CO2 27 02/14/2018 0940   GLUCOSE 110 (H) 02/14/2018 0940   BUN 12 02/14/2018 0940   CREATININE 0.79 02/14/2018 0940   CALCIUM 9.4 02/14/2018 0940   GFRNONAA >60 02/14/2018 0940   GFRAA >60 02/14/2018 0940    Lipid Panel     Component Value Date/Time   CHOL 147 11/29/2015 1002   TRIG  105.0 11/29/2015 1002   HDL 44.20  11/29/2015 1002   CHOLHDL 3 11/29/2015 1002   VLDL 21.0 11/29/2015 1002   LDLCALC 82 11/29/2015 1002    CBC    Component Value Date/Time   WBC 3.4 (L) 02/14/2018 0940   RBC 4.46 02/14/2018 0940   HGB 14.2 02/14/2018 0940   HCT 42.1 02/14/2018 0940   PLT 236 02/14/2018 0940   MCV 94.4 02/14/2018 0940   MCH 31.9 02/14/2018 0940   MCHC 33.7 02/14/2018 0940   RDW 13.0 02/14/2018 0940   LYMPHSABS 0.8 (L) 02/14/2018 0940   MONOABS 0.3 02/14/2018 0940   EOSABS 0.1 02/14/2018 0940   BASOSABS 0.0 02/14/2018 0940    Hgb A1C No results found for: HGBA1C    Assessment and Plan:   Medicare Annual Wellness Visit:  Diet: She does eat some meat. She consumes fruits and veggies daily. She tries to avoid fried foods. She drinks mostly sweet tea. Physical activity: None Depression/mood screen: Negative Hearing: Intact to whispered voice Visual acuity: Grossly normal, performs annual eye exam  ADLs: Capable Fall risk: None Home safety: Good Cognitive evaluation: Intact to orientation, naming, recall and repetition EOL planning: No adv directives, full code/ I agree  Preventative Medicine: Get a flu shot.  She declines tetanus vaccine today as insurance will not pay for this.  Advised her to go get a tetanus vaccine if she cuts herself or stepped on anything rusty.  Pneumovax today.  Prevnar up-to-date.  Mammogram up-to-date.  She no longer needs pap smears.  She has her bone density scheduled. Colon screening due 2020.  Encouraged her to consume a balanced diet and exercise regimen.  Advised her to see an eye doctor and dentist. Will check CBC, CMET, Lipid and Vit D today. She is not interested in advanced care planning at this time.   Next appointment: 1 year, Medicare Wellness Exam   [name], NP

## 2018-03-01 NOTE — Patient Instructions (Signed)
Health Maintenance for Postmenopausal Women Menopause is a normal process in which your reproductive ability comes to an end. This process happens gradually over a span of months to years, usually between the ages of 22 and 9. Menopause is complete when you have missed 12 consecutive menstrual periods. It is important to talk with your health care provider about some of the most common conditions that affect postmenopausal women, such as heart disease, cancer, and bone loss (osteoporosis). Adopting a healthy lifestyle and getting preventive care can help to promote your health and wellness. Those actions can also lower your chances of developing some of these common conditions. What should I know about menopause? During menopause, you may experience a number of symptoms, such as:  Moderate-to-severe hot flashes.  Night sweats.  Decrease in sex drive.  Mood swings.  Headaches.  Tiredness.  Irritability.  Memory problems.  Insomnia.  Choosing to treat or not to treat menopausal changes is an individual decision that you make with your health care provider. What should I know about hormone replacement therapy and supplements? Hormone therapy products are effective for treating symptoms that are associated with menopause, such as hot flashes and night sweats. Hormone replacement carries certain risks, especially as you become older. If you are thinking about using estrogen or estrogen with progestin treatments, discuss the benefits and risks with your health care provider. What should I know about heart disease and stroke? Heart disease, heart attack, and stroke become more likely as you age. This may be due, in part, to the hormonal changes that your body experiences during menopause. These can affect how your body processes dietary fats, triglycerides, and cholesterol. Heart attack and stroke are both medical emergencies. There are many things that you can do to help prevent heart disease  and stroke:  Have your blood pressure checked at least every 1-2 years. High blood pressure causes heart disease and increases the risk of stroke.  If you are 53-22 years old, ask your health care provider if you should take aspirin to prevent a heart attack or a stroke.  Do not use any tobacco products, including cigarettes, chewing tobacco, or electronic cigarettes. If you need help quitting, ask your health care provider.  It is important to eat a healthy diet and maintain a healthy weight. ? Be sure to include plenty of vegetables, fruits, low-fat dairy products, and lean protein. ? Avoid eating foods that are high in solid fats, added sugars, or salt (sodium).  Get regular exercise. This is one of the most important things that you can do for your health. ? Try to exercise for at least 150 minutes each week. The type of exercise that you do should increase your heart rate and make you sweat. This is known as moderate-intensity exercise. ? Try to do strengthening exercises at least twice each week. Do these in addition to the moderate-intensity exercise.  Know your numbers.Ask your health care provider to check your cholesterol and your blood glucose. Continue to have your blood tested as directed by your health care provider.  What should I know about cancer screening? There are several types of cancer. Take the following steps to reduce your risk and to catch any cancer development as early as possible. Breast Cancer  Practice breast self-awareness. ? This means understanding how your breasts normally appear and feel. ? It also means doing regular breast self-exams. Let your health care provider know about any changes, no matter how small.  If you are 40  or older, have a clinician do a breast exam (clinical breast exam or CBE) every year. Depending on your age, family history, and medical history, it may be recommended that you also have a yearly breast X-ray (mammogram).  If you  have a family history of breast cancer, talk with your health care provider about genetic screening.  If you are at high risk for breast cancer, talk with your health care provider about having an MRI and a mammogram every year.  Breast cancer (BRCA) gene test is recommended for women who have family members with BRCA-related cancers. Results of the assessment will determine the need for genetic counseling and BRCA1 and for BRCA2 testing. BRCA-related cancers include these types: ? Breast. This occurs in males or females. ? Ovarian. ? Tubal. This may also be called fallopian tube cancer. ? Cancer of the abdominal or pelvic lining (peritoneal cancer). ? Prostate. ? Pancreatic.  Cervical, Uterine, and Ovarian Cancer Your health care provider may recommend that you be screened regularly for cancer of the pelvic organs. These include your ovaries, uterus, and vagina. This screening involves a pelvic exam, which includes checking for microscopic changes to the surface of your cervix (Pap test).  For women ages 21-65, health care providers may recommend a pelvic exam and a Pap test every three years. For women ages 79-65, they may recommend the Pap test and pelvic exam, combined with testing for human papilloma virus (HPV), every five years. Some types of HPV increase your risk of cervical cancer. Testing for HPV may also be done on women of any age who have unclear Pap test results.  Other health care providers may not recommend any screening for nonpregnant women who are considered low risk for pelvic cancer and have no symptoms. Ask your health care provider if a screening pelvic exam is right for you.  If you have had past treatment for cervical cancer or a condition that could lead to cancer, you need Pap tests and screening for cancer for at least 20 years after your treatment. If Pap tests have been discontinued for you, your risk factors (such as having a new sexual partner) need to be  reassessed to determine if you should start having screenings again. Some women have medical problems that increase the chance of getting cervical cancer. In these cases, your health care provider may recommend that you have screening and Pap tests more often.  If you have a family history of uterine cancer or ovarian cancer, talk with your health care provider about genetic screening.  If you have vaginal bleeding after reaching menopause, tell your health care provider.  There are currently no reliable tests available to screen for ovarian cancer.  Lung Cancer Lung cancer screening is recommended for adults 69-62 years old who are at high risk for lung cancer because of a history of smoking. A yearly low-dose CT scan of the lungs is recommended if you:  Currently smoke.  Have a history of at least 30 pack-years of smoking and you currently smoke or have quit within the past 15 years. A pack-year is smoking an average of one pack of cigarettes per day for one year.  Yearly screening should:  Continue until it has been 15 years since you quit.  Stop if you develop a health problem that would prevent you from having lung cancer treatment.  Colorectal Cancer  This type of cancer can be detected and can often be prevented.  Routine colorectal cancer screening usually begins at  age 42 and continues through age 45.  If you have risk factors for colon cancer, your health care provider may recommend that you be screened at an earlier age.  If you have a family history of colorectal cancer, talk with your health care provider about genetic screening.  Your health care provider may also recommend using home test kits to check for hidden blood in your stool.  A small camera at the end of a tube can be used to examine your colon directly (sigmoidoscopy or colonoscopy). This is done to check for the earliest forms of colorectal cancer.  Direct examination of the colon should be repeated every  5-10 years until age 71. However, if early forms of precancerous polyps or small growths are found or if you have a family history or genetic risk for colorectal cancer, you may need to be screened more often.  Skin Cancer  Check your skin from head to toe regularly.  Monitor any moles. Be sure to tell your health care provider: ? About any new moles or changes in moles, especially if there is a change in a mole's shape or color. ? If you have a mole that is larger than the size of a pencil eraser.  If any of your family members has a history of skin cancer, especially at a young age, talk with your health care provider about genetic screening.  Always use sunscreen. Apply sunscreen liberally and repeatedly throughout the day.  Whenever you are outside, protect yourself by wearing long sleeves, pants, a wide-brimmed hat, and sunglasses.  What should I know about osteoporosis? Osteoporosis is a condition in which bone destruction happens more quickly than new bone creation. After menopause, you may be at an increased risk for osteoporosis. To help prevent osteoporosis or the bone fractures that can happen because of osteoporosis, the following is recommended:  If you are 46-71 years old, get at least 1,000 mg of calcium and at least 600 mg of vitamin D per day.  If you are older than age 55 but younger than age 65, get at least 1,200 mg of calcium and at least 600 mg of vitamin D per day.  If you are older than age 54, get at least 1,200 mg of calcium and at least 800 mg of vitamin D per day.  Smoking and excessive alcohol intake increase the risk of osteoporosis. Eat foods that are rich in calcium and vitamin D, and do weight-bearing exercises several times each week as directed by your health care provider. What should I know about how menopause affects my mental health? Depression may occur at any age, but it is more common as you become older. Common symptoms of depression  include:  Low or sad mood.  Changes in sleep patterns.  Changes in appetite or eating patterns.  Feeling an overall lack of motivation or enjoyment of activities that you previously enjoyed.  Frequent crying spells.  Talk with your health care provider if you think that you are experiencing depression. What should I know about immunizations? It is important that you get and maintain your immunizations. These include:  Tetanus, diphtheria, and pertussis (Tdap) booster vaccine.  Influenza every year before the flu season begins.  Pneumonia vaccine.  Shingles vaccine.  Your health care provider may also recommend other immunizations. This information is not intended to replace advice given to you by your health care provider. Make sure you discuss any questions you have with your health care provider. Document Released: 12/04/2005  Document Revised: 05/01/2016 Document Reviewed: 07/16/2015 Elsevier Interactive Patient Education  2018 [employer]  

## 2018-03-01 NOTE — Assessment & Plan Note (Signed)
Encouraged smoking cessation Continue albuterol as needed

## 2018-03-01 NOTE — Assessment & Plan Note (Signed)
Continue Imodium as needed Discussed FODMAP diet

## 2018-03-01 NOTE — Assessment & Plan Note (Signed)
In remission She will continue to follow with oncology and ENT.

## 2018-03-01 NOTE — Assessment & Plan Note (Signed)
Currently not an issue °We will monitor °

## 2018-03-01 NOTE — Assessment & Plan Note (Signed)
CMET and lipid profile today Encouraged her to consume a low fat diet 

## 2018-03-01 NOTE — Assessment & Plan Note (Signed)
She will continue to follow with dermatology yearly

## 2018-03-01 NOTE — Assessment & Plan Note (Signed)
Continue Flexeril as needed.

## 2018-03-01 NOTE — Assessment & Plan Note (Signed)
Controlled on Zyrtec and Pataday

## 2018-03-01 NOTE — Assessment & Plan Note (Signed)
Stable on her current dose of Pantoprazole Discussed weaning-but she is not interested in weaning medication at this time CBC and CMET today Discussed avoiding foods that trigger her reflux

## 2018-03-01 NOTE — Assessment & Plan Note (Signed)
CBC and CMET today Controlled off meds Will monitor

## 2018-03-01 NOTE — Assessment & Plan Note (Signed)
She is not sure what triggers this and is not interested in medication therapy to prevent daily headaches Discussed keeping a headache diary to try to identify triggers Continue Tylenol and Excedrin Migraine as needed

## 2018-03-02 NOTE — Addendum Note (Signed)
Addended by: Lurlean Nanny on: 03/02/2018 10:20 AM   Modules accepted: Orders

## 2018-03-14 ENCOUNTER — Ambulatory Visit
Admission: RE | Admit: 2018-03-14 | Discharge: 2018-03-14 | Disposition: A | Discharge status: Home / Self Care | Payer: Medicare PPO | Source: Ambulatory Visit | Attending: Urgent Care | Admitting: Urgent Care

## 2018-03-14 DIAGNOSIS — M85832 Other specified disorders of bone density and structure, left forearm: Secondary | ICD-10-CM | POA: Insufficient documentation

## 2018-03-14 DIAGNOSIS — M85851 Other specified disorders of bone density and structure, right thigh: Secondary | ICD-10-CM | POA: Insufficient documentation

## 2018-03-14 DIAGNOSIS — M8580 Other specified disorders of bone density and structure, unspecified site: Secondary | ICD-10-CM

## 2018-03-17 ENCOUNTER — Ambulatory Visit: Payer: Medicare PPO | Admitting: Internal Medicine

## 2018-03-17 ENCOUNTER — Encounter: Payer: Self-pay | Admitting: Internal Medicine

## 2018-03-17 VITALS — BP 132/84 | HR 80 | Temp 98.4°F | Wt 155.0 lb

## 2018-03-17 DIAGNOSIS — J441 Chronic obstructive pulmonary disease with (acute) exacerbation: Secondary | ICD-10-CM

## 2018-03-17 MED ORDER — HYDROCOD POLST-CPM POLST ER 10-8 MG/5ML PO SUER: 5.0000 mL | Freq: Every evening | ORAL | 0 refills | Status: DC | PRN

## 2018-03-17 MED ORDER — ALBUTEROL SULFATE HFA 108 (90 BASE) MCG/ACT IN AERS: 2.0000 | INHALATION_SPRAY | Freq: Four times a day (QID) | RESPIRATORY_TRACT | 0 refills | Status: DC | PRN

## 2018-03-17 MED ORDER — AZITHROMYCIN 250 MG PO TABS: ORAL_TABLET | ORAL | 0 refills | Status: DC

## 2018-03-17 NOTE — Progress Notes (Signed)
HPI  Pt presents to the clinic today with c/o sore throat, cough and shortness of breath. She reports this started 3-4 weeks ago. She denies difficulty swallowing. The sore throat has improved. The cough is mostly non productive and seems worse at night. Her shortness of breath is mostly with exertion, but she denies chest pain. She reports fever of 101.3 last night, but denies chills or body aches. She has tried Delsym, Mucinex and Zyrtec without relief. She has a history of allergies and asthma/COPD. She has not had sick contacts.  Review of Systems      Past Medical History:  Diagnosis Date  . Allergic rhinitis   . Arthritis   . Asthma   . Cancer ([place])    melanoma  . Cancer of nasal cavity and sinus ([place]) 07/07/2015   radiation  . Cervical spondylosis   . Colitis   . Colon polyps   . Depression   . Diverticulosis   . GERD (gastroesophageal reflux disease)   . Glaucoma   . Headaches, cluster   . Hyperlipidemia   . IBS (irritable bowel syndrome)   . Kidney stones   . Neuropathy   . PONV (postoperative nausea and vomiting)   . Tobacco abuse     Family History  Problem Relation Age of Onset  . Hypertension Mother   . Hyperlipidemia Mother   . Diabetes Mother   . Colon cancer Father   . Pancreatic cancer Father   . Cancer Father        Colon & Pancreatic  . Breast cancer Paternal Grandmother 89  . Diabetes Paternal Grandmother   . Coronary artery disease Paternal Grandmother   . Heart failure Paternal Grandmother   . Cancer Paternal Grandmother        breast  . Stroke Brother     Social History   Socioeconomic History  . Marital status: Married    Spouse name: Not on file  . Number of children: 2  . Years of education: Not on file  . Highest education level: Not on file  Occupational History  . Occupation: Investment banker, operational  . Financial resource strain: Not on file  . Food insecurity:    Worry: Not on file    Inability: Not on file  . Transportation  needs:    Medical: Not on file    Non-medical: Not on file  Tobacco Use  . Smoking status: Current Every Day Smoker    Packs/day: 0.25    Years: 39.00    Pack years: 9.75    Types: Cigarettes  . Smokeless tobacco: Never Used  . Tobacco comment: e cigs currently--trying to quit  Substance and Sexual Activity  . Alcohol use: No  . Drug use: No  . Sexual activity: Not Currently    Partners: Male  Lifestyle  . Physical activity:    Days per week: Not on file    Minutes per session: Not on file  . Stress: Not on file  Relationships  . Social connections:    Talks on phone: Not on file    Gets together: Not on file    Attends religious service: Not on file    Active member of club or organization: Not on file    Attends meetings of clubs or organizations: Not on file    Relationship status: Not on file  . Intimate partner violence:    Fear of current or ex partner: Not on file    Emotionally abused: Not  on file    Physically abused: Not on file    Forced sexual activity: Not on file  Other Topics Concern  . Not on file  Social History Narrative   Lives in [place] with husband. Dog in home. Work - disabled for neck and back pain.    Allergies  Allergen Reactions  . Albumin (Human) Rash  . Amoxicillin-Pot Clavulanate Hives  . Gabapentin Shortness Of Breath  . Latex Dermatitis and Rash  . Betadine [Povidone Iodine] Dermatitis, Rash and Other (See Comments)    Skin burning Topical betadine and iodine have this reaction with patient.  IV contrast is NOT a problem.  jkl  . Sucralfate     Other reaction(s): Other (See Comments) Abdominal Pain  . Amoxicillin     REACTION: rash  . Chantix  [Varenicline Tartrate]   . Chantix [Varenicline Tartrate]     Heart racing  . Erythromycin     REACTION: rash  . Sucralfate Nausea And Vomiting    Heart racing Lightheaded      Constitutional: Positive fever. Denies headache, fatigue, or abrupt weight changes.  HEENT:   Positive sore throat. Denies eye redness, eye pain, pressure behind the eyes, facial pain, nasal congestion, ear pain, ringing in the ears, wax buildup, runny nose or bloody nose. Respiratory: Positive cough and shortness of breath. Denies difficulty breathing.  Cardiovascular: Denies chest pain, chest tightness, palpitations or swelling in the hands or feet.   No other specific complaints in a complete review of systems (except as listed in HPI above).  Objective:   BP 132/84   Pulse 80   Temp 98.4 F (36.9 C) (Oral)   Wt 155 lb (70.3 kg)   SpO2 99%   BMI 25.99 kg/m   Wt Readings from Last 3 Encounters:  03/17/18 155 lb (70.3 kg)  03/01/18 156 lb (70.8 kg)  02/14/18 155 lb 15.6 oz (70.8 kg)     General: Appears her stated age, in NAD. HEENT: Head: normal shape and size; Eyes: sclera white, no icterus, conjunctiva pink; Ears: Tm's gray and intact, normal light reflex; Nose: mucosa pink and moist, septum midline; Throat/Mouth: + PND. Teeth present, mucosa erythematous and moist, no exudate noted, no lesions or ulcerations noted.  Neck: No cervical lymphadenopathy.  Cardiovascular: Normal rate and rhythm.  Pulmonary/Chest: Normal effort and positive vesicular breath sounds with rhonchi noted in bilateral bases. No respiratory distress. No wheezes, rales noted.       Assessment & Plan:   COPD Exacerbation:  Get some rest and drink plenty of water Do salt water gargles for the sore throat eRx for Azithromax x 5 days eRx for Albuterol inhaler eRx for Tussionex cough syrup  RTC as needed or if symptoms persist.   [name], NP

## 2018-03-17 NOTE — Patient Instructions (Signed)

## 2018-03-28 DIAGNOSIS — D481 Neoplasm of uncertain behavior of connective and other soft tissue: Secondary | ICD-10-CM | POA: Diagnosis not present

## 2018-03-28 DIAGNOSIS — C300 Malignant neoplasm of nasal cavity: Secondary | ICD-10-CM | POA: Diagnosis not present

## 2018-03-28 DIAGNOSIS — J309 Allergic rhinitis, unspecified: Secondary | ICD-10-CM | POA: Diagnosis not present

## 2018-04-01 DIAGNOSIS — J301 Allergic rhinitis due to pollen: Secondary | ICD-10-CM | POA: Diagnosis not present

## 2018-04-04 DIAGNOSIS — J301 Allergic rhinitis due to pollen: Secondary | ICD-10-CM | POA: Diagnosis not present

## 2018-04-07 DIAGNOSIS — J301 Allergic rhinitis due to pollen: Secondary | ICD-10-CM | POA: Diagnosis not present

## 2018-04-11 DIAGNOSIS — J301 Allergic rhinitis due to pollen: Secondary | ICD-10-CM | POA: Diagnosis not present

## 2018-04-14 DIAGNOSIS — J301 Allergic rhinitis due to pollen: Secondary | ICD-10-CM | POA: Diagnosis not present

## 2018-04-15 DIAGNOSIS — J301 Allergic rhinitis due to pollen: Secondary | ICD-10-CM | POA: Diagnosis not present

## 2018-04-18 DIAGNOSIS — J301 Allergic rhinitis due to pollen: Secondary | ICD-10-CM | POA: Diagnosis not present

## 2018-04-21 DIAGNOSIS — J301 Allergic rhinitis due to pollen: Secondary | ICD-10-CM | POA: Diagnosis not present

## 2018-04-25 DIAGNOSIS — J301 Allergic rhinitis due to pollen: Secondary | ICD-10-CM | POA: Diagnosis not present

## 2018-04-27 ENCOUNTER — Ambulatory Visit: Payer: Medicare PPO | Admitting: Internal Medicine

## 2018-04-27 ENCOUNTER — Encounter: Payer: Self-pay | Admitting: Internal Medicine

## 2018-04-27 VITALS — BP 126/84 | HR 81 | Temp 97.6°F | Ht 64.75 in | Wt 160.0 lb

## 2018-04-27 DIAGNOSIS — R8290 Unspecified abnormal findings in urine: Secondary | ICD-10-CM

## 2018-04-27 LAB — POC URINALSYSI DIPSTICK (AUTOMATED)
Bilirubin, UA: NEGATIVE
Blood, UA: NEGATIVE
GLUCOSE UA: NEGATIVE
KETONES UA: NEGATIVE
LEUKOCYTES UA: NEGATIVE
Nitrite, UA: NEGATIVE
PROTEIN UA: NEGATIVE
SPEC GRAV UA: 1.015 (ref 1.010–1.025)
Urobilinogen, UA: 0.2 E.U./dL
pH, UA: 6 (ref 5.0–8.0)

## 2018-04-27 MED ORDER — SULFAMETHOXAZOLE-TRIMETHOPRIM 800-160 MG PO TABS: 1.0000 | ORAL_TABLET | Freq: Two times a day (BID) | ORAL | 1 refills | Status: DC

## 2018-04-27 NOTE — Patient Instructions (Signed)
Please increase your fluids--like more water. You can try an over the counter cranberry tablets as well If your symptoms worsen, start the antibiotic.

## 2018-04-27 NOTE — Progress Notes (Signed)
Subjective:    Patient ID: Heather Beasley, female    DOB: 1956/08/14, 62 y.o.   MRN: [id number]  HPI Here due to urinary symptoms  Urine has "foul odor" to it Worried about UTI--due to weakness and some nausea (what has presaged her infections in the past)  No urgency, dysuria or hematuria  Current Outpatient Medications on File Prior to Visit  Medication Sig Dispense Refill  . albuterol (PROVENTIL HFA;VENTOLIN HFA) 108 (90 Base) MCG/ACT inhaler Inhale 2 puffs into the lungs every 6 (six) hours as needed for wheezing or shortness of breath. 1 Inhaler 0  . aspirin EC 81 MG tablet Take 81 mg by mouth daily.    . cetirizine (ZYRTEC) 10 MG tablet Take 10 mg by mouth daily. Allergies.    . cyclobenzaprine (FLEXERIL) 10 MG tablet Take 1 tablet (10 mg total) by mouth 3 (three) times daily. 90 tablet 6  . loperamide (IMODIUM A-D) 2 MG tablet Take 2 mg by mouth 4 (four) times daily as needed for diarrhea or loose stools.    . Multiple Vitamins-Minerals (CENTRUM ADULTS PO) Take 1 tablet by mouth.    . pantoprazole (PROTONIX) 40 MG tablet Take 1 tablet (40 mg total) by mouth 2 (two) times daily. 60 tablet 11   No current facility-administered medications on file prior to visit.     Allergies  Allergen Reactions  . Albumin (Human) Rash  . Amoxicillin-Pot Clavulanate Hives  . Gabapentin Shortness Of Breath  . Latex Dermatitis and Rash  . Betadine [Povidone Iodine] Dermatitis, Rash and Other (See Comments)    Skin burning Topical betadine and iodine have this reaction with patient.  IV contrast is NOT a problem.  jkl  . Sucralfate     Other reaction(s): Other (See Comments) Abdominal Pain  . Amoxicillin     REACTION: rash  . Chantix  [Varenicline Tartrate]   . Chantix [Varenicline Tartrate]     Heart racing  . Erythromycin     REACTION: rash  . Sucralfate Nausea And Vomiting    Heart racing Lightheaded     Past Medical History:  Diagnosis Date  . Allergic rhinitis   .  Arthritis   . Asthma   . Cancer ([place])    melanoma  . Cancer of nasal cavity and sinus ([place]) 07/07/2015   radiation  . Cervical spondylosis   . Colitis   . Colon polyps   . Depression   . Diverticulosis   . GERD (gastroesophageal reflux disease)   . Glaucoma   . Headaches, cluster   . Hyperlipidemia   . IBS (irritable bowel syndrome)   . Kidney stones   . Neuropathy   . PONV (postoperative nausea and vomiting)   . Tobacco abuse     Past Surgical History:  Procedure Laterality Date  . ABDOMINAL HYSTERECTOMY  1998  . APPENDECTOMY  1998  . ARM FX REPAIR  2001  . BACK SURGERY    . CERVICAL FUSION  2010  . CHOLECYSTECTOMY  08/2011   Dr Rochel Brome  . COLONOSCOPY     Dr Tiffany Kocher  . CYSTOSCOPY     Dr. Bernardo Heater  . DILATION AND CURETTAGE OF UTERUS    . ESOPHAGOGASTRODUODENOSCOPY  2012   Dr Tiffany Kocher  . FRACTURE SURGERY  2001   plate in right wrist and arm  . IMAGE GUIDED SINUS SURGERY N/A 06/25/2015   Procedure: IMAGE GUIDED SINUS SURGERY;  Surgeon: Beverly Gust, MD;  Location: [facility] ORS;  Service: ENT;  Laterality: N/A;  . LUMBAR SPINE SURGERY  07/2004  . OOPHORECTOMY    . POLYPECTOMY N/A 06/25/2015   Procedure: POLYPECTOMY NASAL;  Surgeon: Beverly Gust, MD;  Location: [facility] ORS;  Service: ENT;  Laterality: N/A;  . [place]  . VAGINAL DELIVERY     x2    Family History  Problem Relation Age of Onset  . Hypertension Mother   . Hyperlipidemia Mother   . Diabetes Mother   . Colon cancer Father   . Pancreatic cancer Father   . Cancer Father        Colon & Pancreatic  . Breast cancer Paternal Grandmother 77  . Diabetes Paternal Grandmother   . Coronary artery disease Paternal Grandmother   . Heart failure Paternal Grandmother   . Cancer Paternal Grandmother        breast  . Stroke Brother     Social History   Socioeconomic History  . Marital status: Married    Spouse name: Not on file  . Number of children: 2  . Years of education: Not on file  .  Highest education level: Not on file  Occupational History  . Occupation: Investment banker, operational  . Financial resource strain: Not on file  . Food insecurity:    Worry: Not on file    Inability: Not on file  . Transportation needs:    Medical: Not on file    Non-medical: Not on file  Tobacco Use  . Smoking status: Current Every Day Smoker    Packs/day: 0.25    Years: 39.00    Pack years: 9.75    Types: Cigarettes  . Smokeless tobacco: Never Used  . Tobacco comment: e cigs currently--trying to quit  Substance and Sexual Activity  . Alcohol use: No  . Drug use: No  . Sexual activity: Not Currently    Partners: Male  Lifestyle  . Physical activity:    Days per week: Not on file    Minutes per session: Not on file  . Stress: Not on file  Relationships  . Social connections:    Talks on phone: Not on file    Gets together: Not on file    Attends religious service: Not on file    Active member of club or organization: Not on file    Attends meetings of clubs or organizations: Not on file    Relationship status: Not on file  . Intimate partner violence:    Fear of current or ex partner: Not on file    Emotionally abused: Not on file    Physically abused: Not on file    Forced sexual activity: Not on file  Other Topics Concern  . Not on file  Social History Narrative   Lives in [place] with husband. Dog in home. Work - disabled for neck and back pain.   Review of Systems  No apparent fever No vomiting Appetite off some---skipped dinner last night     Objective:   Physical Exam  Constitutional: She appears well-developed. No distress.  GI: Soft. She exhibits no distension. There is no tenderness. There is no rebound and no guarding.  Musculoskeletal:  Some vague mid back tenderness (had fusion and some chronic pain)           Assessment & Plan:

## 2018-04-27 NOTE — Assessment & Plan Note (Signed)
No clear dehydration and SG only 1.015 May have some bacteria in but no clear cystitis symptoms now Some vague systemic symptoms--nausea and fatigue Discussed increased fluids and cranberry Start bactrim if symptoms worsen

## 2018-05-02 DIAGNOSIS — J301 Allergic rhinitis due to pollen: Secondary | ICD-10-CM | POA: Diagnosis not present

## 2018-05-05 DIAGNOSIS — J301 Allergic rhinitis due to pollen: Secondary | ICD-10-CM | POA: Diagnosis not present

## 2018-05-09 DIAGNOSIS — J301 Allergic rhinitis due to pollen: Secondary | ICD-10-CM | POA: Diagnosis not present

## 2018-05-12 DIAGNOSIS — J301 Allergic rhinitis due to pollen: Secondary | ICD-10-CM | POA: Diagnosis not present

## 2018-05-13 DIAGNOSIS — J301 Allergic rhinitis due to pollen: Secondary | ICD-10-CM | POA: Diagnosis not present

## 2018-05-16 DIAGNOSIS — J301 Allergic rhinitis due to pollen: Secondary | ICD-10-CM | POA: Diagnosis not present

## 2018-05-19 DIAGNOSIS — J301 Allergic rhinitis due to pollen: Secondary | ICD-10-CM | POA: Diagnosis not present

## 2018-05-23 DIAGNOSIS — J301 Allergic rhinitis due to pollen: Secondary | ICD-10-CM | POA: Diagnosis not present

## 2018-05-26 DIAGNOSIS — J301 Allergic rhinitis due to pollen: Secondary | ICD-10-CM | POA: Diagnosis not present

## 2018-05-30 DIAGNOSIS — J301 Allergic rhinitis due to pollen: Secondary | ICD-10-CM | POA: Diagnosis not present

## 2018-06-01 DIAGNOSIS — J301 Allergic rhinitis due to pollen: Secondary | ICD-10-CM | POA: Diagnosis not present

## 2018-06-02 DIAGNOSIS — J301 Allergic rhinitis due to pollen: Secondary | ICD-10-CM | POA: Diagnosis not present

## 2018-06-06 DIAGNOSIS — J301 Allergic rhinitis due to pollen: Secondary | ICD-10-CM | POA: Diagnosis not present

## 2018-06-09 DIAGNOSIS — J301 Allergic rhinitis due to pollen: Secondary | ICD-10-CM | POA: Diagnosis not present

## 2018-06-13 DIAGNOSIS — J301 Allergic rhinitis due to pollen: Secondary | ICD-10-CM | POA: Diagnosis not present

## 2018-06-16 DIAGNOSIS — J301 Allergic rhinitis due to pollen: Secondary | ICD-10-CM | POA: Diagnosis not present

## 2018-06-20 DIAGNOSIS — J301 Allergic rhinitis due to pollen: Secondary | ICD-10-CM | POA: Diagnosis not present

## 2018-06-22 DIAGNOSIS — J301 Allergic rhinitis due to pollen: Secondary | ICD-10-CM | POA: Diagnosis not present

## 2018-06-23 DIAGNOSIS — J301 Allergic rhinitis due to pollen: Secondary | ICD-10-CM | POA: Diagnosis not present

## 2018-07-04 DIAGNOSIS — J301 Allergic rhinitis due to pollen: Secondary | ICD-10-CM | POA: Diagnosis not present

## 2018-07-07 DIAGNOSIS — J301 Allergic rhinitis due to pollen: Secondary | ICD-10-CM | POA: Diagnosis not present

## 2018-07-11 DIAGNOSIS — J301 Allergic rhinitis due to pollen: Secondary | ICD-10-CM | POA: Diagnosis not present

## 2018-07-14 DIAGNOSIS — J301 Allergic rhinitis due to pollen: Secondary | ICD-10-CM | POA: Diagnosis not present

## 2018-07-18 DIAGNOSIS — J301 Allergic rhinitis due to pollen: Secondary | ICD-10-CM | POA: Diagnosis not present

## 2018-07-21 DIAGNOSIS — J301 Allergic rhinitis due to pollen: Secondary | ICD-10-CM | POA: Diagnosis not present

## 2018-07-25 DIAGNOSIS — J301 Allergic rhinitis due to pollen: Secondary | ICD-10-CM | POA: Diagnosis not present

## 2018-08-05 DIAGNOSIS — J301 Allergic rhinitis due to pollen: Secondary | ICD-10-CM | POA: Diagnosis not present

## 2018-08-11 DIAGNOSIS — J301 Allergic rhinitis due to pollen: Secondary | ICD-10-CM | POA: Diagnosis not present

## 2018-08-15 ENCOUNTER — Inpatient Hospital Stay: Payer: Medicare PPO

## 2018-08-15 ENCOUNTER — Inpatient Hospital Stay: Payer: Medicare PPO | Admitting: Hematology and Oncology

## 2018-08-22 DIAGNOSIS — J301 Allergic rhinitis due to pollen: Secondary | ICD-10-CM | POA: Diagnosis not present

## 2018-08-23 ENCOUNTER — Inpatient Hospital Stay: Payer: Medicare PPO | Admitting: Hematology and Oncology

## 2018-08-23 ENCOUNTER — Inpatient Hospital Stay: Payer: Medicare PPO | Attending: Hematology and Oncology

## 2018-08-23 ENCOUNTER — Encounter: Payer: Self-pay | Admitting: Hematology and Oncology

## 2018-08-23 VITALS — BP 121/85 | HR 73 | Temp 95.6°F | Resp 18 | Wt 169.6 lb

## 2018-08-23 DIAGNOSIS — Z79899 Other long term (current) drug therapy: Secondary | ICD-10-CM | POA: Insufficient documentation

## 2018-08-23 DIAGNOSIS — C300 Malignant neoplasm of nasal cavity: Secondary | ICD-10-CM

## 2018-08-23 DIAGNOSIS — K117 Disturbances of salivary secretion: Secondary | ICD-10-CM | POA: Insufficient documentation

## 2018-08-23 DIAGNOSIS — R635 Abnormal weight gain: Secondary | ICD-10-CM

## 2018-08-23 DIAGNOSIS — Z720 Tobacco use: Secondary | ICD-10-CM

## 2018-08-23 DIAGNOSIS — M8580 Other specified disorders of bone density and structure, unspecified site: Secondary | ICD-10-CM | POA: Insufficient documentation

## 2018-08-23 DIAGNOSIS — R918 Other nonspecific abnormal finding of lung field: Secondary | ICD-10-CM | POA: Insufficient documentation

## 2018-08-23 DIAGNOSIS — Y842 Radiological procedure and radiotherapy as the cause of abnormal reaction of the patient, or of later complication, without mention of misadventure at the time of the procedure: Secondary | ICD-10-CM

## 2018-08-23 DIAGNOSIS — C319 Malignant neoplasm of accessory sinus, unspecified: Principal | ICD-10-CM

## 2018-08-23 LAB — CBC WITH DIFFERENTIAL/PLATELET
Abs Immature Granulocytes: 0.01 10*3/uL (ref 0.00–0.07)
Basophils Absolute: 0 10*3/uL (ref 0.0–0.1)
Basophils Relative: 1 %
Eosinophils Absolute: 0.2 10*3/uL (ref 0.0–0.5)
Eosinophils Relative: 5 %
HCT: 42.9 % (ref 36.0–46.0)
Hemoglobin: 13.6 g/dL (ref 12.0–15.0)
Immature Granulocytes: 0 %
Lymphocytes Relative: 27 %
Lymphs Abs: 1 10*3/uL (ref 0.7–4.0)
MCH: 30.4 pg (ref 26.0–34.0)
MCHC: 31.7 g/dL (ref 30.0–36.0)
MCV: 96 fL (ref 80.0–100.0)
Monocytes Absolute: 0.3 10*3/uL (ref 0.1–1.0)
Monocytes Relative: 9 %
Neutro Abs: 2.2 10*3/uL (ref 1.7–7.7)
Neutrophils Relative %: 58 %
Platelets: 208 10*3/uL (ref 150–400)
RBC: 4.47 MIL/uL (ref 3.87–5.11)
RDW: 12.8 % (ref 11.5–15.5)
WBC: 3.8 10*3/uL — ABNORMAL LOW (ref 4.0–10.5)
nRBC: 0 % (ref 0.0–0.2)

## 2018-08-23 LAB — COMPREHENSIVE METABOLIC PANEL
ALT: 13 U/L (ref 0–44)
AST: 23 U/L (ref 15–41)
Albumin: 3.8 g/dL (ref 3.5–5.0)
Alkaline Phosphatase: 83 U/L (ref 38–126)
Anion gap: 5 (ref 5–15)
BUN: 21 mg/dL — ABNORMAL HIGH (ref 6–20)
CO2: 28 mmol/L (ref 22–32)
Calcium: 9.1 mg/dL (ref 8.9–10.3)
Chloride: 106 mmol/L (ref 98–111)
Creatinine, Ser: 0.92 mg/dL (ref 0.44–1.00)
GFR calc Af Amer: >60 mL/min (ref >60)
GFR calc non Af Amer: >60 mL/min (ref >60)
Glucose, Bld: 105 mg/dL — ABNORMAL HIGH (ref 70–99)
Potassium: 5 mmol/L (ref 3.5–5.1)
Sodium: 139 mmol/L (ref 135–145)
Total Bilirubin: 0.4 mg/dL (ref 0.3–1.2)
Total Protein: 6.7 g/dL (ref 6.5–8.1)

## 2018-08-23 LAB — T4, FREE: Free T4: 0.73 ng/dL — ABNORMAL LOW (ref 0.82–1.77)

## 2018-08-23 LAB — TSH: TSH: 3.466 u[IU]/mL (ref 0.350–4.500)

## 2018-08-23 NOTE — Progress Notes (Signed)
[facility] day:  08/23/2018  Chief Complaint: Heather Beasley is a 62 y.o. female with stage I right nasal cavity adenocarcinoma who is seen for 6 month assessment.  HPI:  The patient was last seen in the medical oncology clinic on 02/14/2018.  At that time, she was doing well.  She was recovering from a recent full dental extraction.  She was adjusting to her dentures.  She continued to have xerostomia, for which she is using peppermint oil only.  Patient continued to smoke cigarettes daily.  Exam revealed no new areas of adenopathy.  Labs were stable.  Maxofacial CT scan on 02/17/2018 revealed no evidence of recurrent sinonasal tumor.  Bilateral mammogram on 02/21/2018 revealed no evidence of malignancy.  Bone density on 03/14/2018 revealed osteopenia with a T-score of -2.3 in the right femoral neck.  During the interim, she notes that she has had "better days".  She notes ongoing issues with her dentures.  She needs to go back to the dentist.  Lower dentures are loose.  She only wears her upper dentures.  Dry mouth symptoms are unchanged.  She continues to "drink a lot of fluid".  She feels full after "eating a small amount".  She has a follow-up with ENT in 08/2018 or 09/2018.   Past Medical History:  Diagnosis Date  . Allergic rhinitis   . Arthritis   . Asthma   . Cancer ([place])    melanoma  . Cancer of nasal cavity and sinus ([place]) 07/07/2015   radiation  . Cervical spondylosis   . Colitis   . Colon polyps   . Depression   . Diverticulosis   . GERD (gastroesophageal reflux disease)   . Glaucoma   . Headaches, cluster   . Hyperlipidemia   . IBS (irritable bowel syndrome)   . Kidney stones   . Neuropathy   . PONV (postoperative nausea and vomiting)   . Tobacco abuse     Past Surgical History:  Procedure Laterality Date  . ABDOMINAL HYSTERECTOMY  1998  . APPENDECTOMY  1998  . ARM FX REPAIR  2001  . BACK SURGERY    .  CERVICAL FUSION  2010  . CHOLECYSTECTOMY  08/2011   Dr Rochel Brome  . COLONOSCOPY     Dr Tiffany Kocher  . CYSTOSCOPY     Dr. Bernardo Heater  . DILATION AND CURETTAGE OF UTERUS    . ESOPHAGOGASTRODUODENOSCOPY  2012   Dr Tiffany Kocher  . FRACTURE SURGERY  2001   plate in right wrist and arm  . IMAGE GUIDED SINUS SURGERY N/A 06/25/2015   Procedure: IMAGE GUIDED SINUS SURGERY;  Surgeon: Beverly Gust, MD;  Location: [facility] ORS;  Service: ENT;  Laterality: N/A;  . LUMBAR SPINE SURGERY  07/2004  . OOPHORECTOMY    . POLYPECTOMY N/A 06/25/2015   Procedure: POLYPECTOMY NASAL;  Surgeon: Beverly Gust, MD;  Location: [facility] ORS;  Service: ENT;  Laterality: N/A;  . [place]  . VAGINAL DELIVERY     x2    Family History  Problem Relation Age of Onset  . Hypertension Mother   . Hyperlipidemia Mother   . Diabetes Mother   . Colon cancer Father   . Pancreatic cancer Father   . Cancer Father        Colon & Pancreatic  . Breast cancer Paternal Grandmother 57  . Diabetes Paternal Grandmother   . Coronary artery disease Paternal Grandmother   . Heart failure Paternal Grandmother   .  Cancer Paternal Grandmother        breast  . Stroke Brother     Social History:  reports that she has been smoking cigarettes. She has a 9.75 pack-year smoking history. She has never used smokeless tobacco. She reports that she does not drink alcohol or use drugs.  She smoked "40 some years".  Previously 1 1/2 pack a day.  She is smoking 1/2 pack/day.  The patient is alone today.  Allergies:  Allergies  Allergen Reactions  . Albumin (Human) Rash  . Amoxicillin-Pot Clavulanate Hives  . Gabapentin Shortness Of Breath  . Latex Dermatitis and Rash  . Betadine [Povidone Iodine] Dermatitis, Rash and Other (See Comments)    Skin burning Topical betadine and iodine have this reaction with patient.  IV contrast is NOT a problem.  jkl  . Sucralfate     Other reaction(s): Other (See Comments) Abdominal Pain  . Amoxicillin      REACTION: rash  . Chantix  [Varenicline Tartrate]   . Chantix [Varenicline Tartrate]     Heart racing  . Erythromycin     REACTION: rash  . Sucralfate Nausea And Vomiting    Heart racing Lightheaded     Current Medications: Current Outpatient Medications  Medication Sig Dispense Refill  . albuterol (PROVENTIL HFA;VENTOLIN HFA) 108 (90 Base) MCG/ACT inhaler Inhale 2 puffs into the lungs every 6 (six) hours as needed for wheezing or shortness of breath. 1 Inhaler 0  . aspirin EC 81 MG tablet Take 81 mg by mouth daily.    . cetirizine (ZYRTEC) 10 MG tablet Take 10 mg by mouth daily. Allergies.    . cyclobenzaprine (FLEXERIL) 10 MG tablet Take 1 tablet (10 mg total) by mouth 3 (three) times daily. 90 tablet 6  . loperamide (IMODIUM A-D) 2 MG tablet Take 2 mg by mouth 4 (four) times daily as needed for diarrhea or loose stools.    . pantoprazole (PROTONIX) 40 MG tablet Take 1 tablet (40 mg total) by mouth 2 (two) times daily. 60 tablet 11  . Multiple Vitamins-Minerals (CENTRUM ADULTS PO) Take 1 tablet by mouth.     No current facility-administered medications for this visit.     Review of Systems:  GENERAL:  Feels "ok".  No fevers, sweats or weight loss.  Weight up 14 pounds. PERFORMANCE STATUS (ECOG):  1 HEENT:  Xerostomia.  Poor fitting dentures.  No visual changes, runny nose, sore throat, mouth sores or tenderness. Lungs: No shortness of breath or cough.  No hemoptysis. Cardiac:  No chest pain, palpitations, orthopnea, or PND. GI:  No nausea, vomiting, diarrhea, constipation, melena or hematochezia. GU:  No urgency, frequency, dysuria, or hematuria. Musculoskeletal:  No back pain.  No joint pain.  No muscle tenderness. Extremities:  No pain or swelling. Skin:  Skin still peeling on fingertips, although less.  No rashes or skin changes. Neuro:  No headache, numbness or weakness, balance or coordination issues. Endocrine:  No diabetes, thyroid issues, hot flashes or night  sweats. Psych:  No mood changes, depression or anxiety. Pain:  No focal pain. Review of systems:  All other systems reviewed and found to be negative.   Physical Exam: Blood pressure 121/85, pulse 73, temperature (!) 95.6 F (35.3 C), temperature source Tympanic, resp. rate 18, weight 169 lb 9 oz (76.9 kg). GENERAL:  Well developed, well nourished, woman sitting comfortably in the exam room in no acute distress. MENTAL STATUS:  Alert and oriented to person, place and time. HEAD:  Dark hair.  Normocephalic, atraumatic, face symmetric, no Cushingoid features. EYES:  Glasses.  Blue eyes.  Pupils equal round and reactive to light and accomodation.  No conjunctivitis or scleral icterus. ENT:  Oropharynx clear without lesion.  Tongue normal. Mucous membranes dry.  RESPIRATORY:  Clear to auscultation without rales, wheezes or rhonchi. CARDIOVASCULAR:  Regular rate and rhythm without murmur, rub or gallop. ABDOMEN:  Soft, non-tender, with active bowel sounds, and no hepatosplenomegaly.  No masses. SKIN:  Tan.  No rashes, ulcers or lesions. EXTREMITIES: No edema, no skin discoloration or tenderness.  No palpable cords. LYMPH NODES: No palpable cervical, supraclavicular, axillary or inguinal adenopathy  NEUROLOGICAL: Unremarkable. PSYCH:  Appropriate.     Appointment on 08/23/2018  Component Date Value Ref Range Status  . Sodium 08/23/2018 139  135 - 145 mmol/L Final  . Potassium 08/23/2018 5.0  3.5 - 5.1 mmol/L Final  . Chloride 08/23/2018 106  98 - 111 mmol/L Final  . CO2 08/23/2018 28  22 - 32 mmol/L Final  . Glucose, Bld 08/23/2018 105* 70 - 99 mg/dL Final  . BUN 08/23/2018 21* 6 - 20 mg/dL Final  . Creatinine, Ser 08/23/2018 0.92  0.44 - 1.00 mg/dL Final  . Calcium 08/23/2018 9.1  8.9 - 10.3 mg/dL Final  . Total Protein 08/23/2018 6.7  6.5 - 8.1 g/dL Final  . Albumin 08/23/2018 3.8  3.5 - 5.0 g/dL Final  . AST 08/23/2018 23  15 - 41 U/L Final  . ALT 08/23/2018 13  0 - 44 U/L Final  .  Alkaline Phosphatase 08/23/2018 83  38 - 126 U/L Final  . Total Bilirubin 08/23/2018 0.4  0.3 - 1.2 mg/dL Final  . GFR calc non Af Amer 08/23/2018 >60  >60 mL/min Final  . GFR calc Af Amer 08/23/2018 >60  >60 mL/min Final   Comment: (NOTE) The eGFR has been calculated using the CKD EPI equation. This calculation has not been validated in all clinical situations. eGFR's persistently <60 mL/min signify possible Chronic Kidney Disease.   Georgiann Hahn gap 08/23/2018 5  5 - 15 Final   Performed at [facility], [address] [place], [place] [address]  . WBC 08/23/2018 3.8* 4.0 - 10.5 K/uL Final  . RBC 08/23/2018 4.47  3.87 - 5.11 MIL/uL Final  . Hemoglobin 08/23/2018 13.6  12.0 - 15.0 g/dL Final  . HCT 08/23/2018 42.9  36.0 - 46.0 % Final  . MCV 08/23/2018 96.0  80.0 - 100.0 fL Final  . MCH 08/23/2018 30.4  26.0 - 34.0 pg Final  . MCHC 08/23/2018 31.7  30.0 - 36.0 g/dL Final  . RDW 08/23/2018 12.8  11.5 - 15.5 % Final  . Platelets 08/23/2018 208  150 - 400 K/uL Final  . nRBC 08/23/2018 0.0  0.0 - 0.2 % Final  . Neutrophils Relative % 08/23/2018 58  % Final  . Neutro Abs 08/23/2018 2.2  1.7 - 7.7 K/uL Final  . Lymphocytes Relative 08/23/2018 27  % Final  . Lymphs Abs 08/23/2018 1.0  0.7 - 4.0 K/uL Final  . Monocytes Relative 08/23/2018 9  % Final  . Monocytes Absolute 08/23/2018 0.3  0.1 - 1.0 K/uL Final  . Eosinophils Relative 08/23/2018 5  % Final  . Eosinophils Absolute 08/23/2018 0.2  0.0 - 0.5 K/uL Final  . Basophils Relative 08/23/2018 1  % Final  . Basophils Absolute 08/23/2018 0.0  0.0 - 0.1 K/uL Final  . Immature Granulocytes 08/23/2018 0  % Final  . Abs  Immature Granulocytes 08/23/2018 0.01  0.00 - 0.07 K/uL Final   Performed at [facility], [address], [place], [place] [address]  . Free T4 08/23/2018 0.73* 0.82 - 1.77 ng/dL Final   Comment: (NOTE) Biotin ingestion may interfere with free T4 tests. If the results are inconsistent with the TSH level, previous  test results, or the clinical presentation, then consider biotin interference. If needed, order repeat testing after stopping biotin. Performed at [facility], [address] [place], [place] [address]   . TSH 08/23/2018 3.466  0.350 - 4.500 uIU/mL Final   Comment: Performed by a 3rd Generation assay with a functional sensitivity of <=0.01 uIU/mL. Performed at [facility], [address] [place], [place] [address]     Assessment:  Heather Beasley is a 62 y.o. female with stage I right nasal cavity adenocarcinoma s/p  incisional biopsy of a polypoid mass in the right nasal cavity and paranasal sinuses on 06/25/2015.  Pathology revealed a 4 cm adenocarcinoma with lymphovascular invasion. There was no perineural invasion. Pathologic stage was T1NxMx.  Head MRI on 06/18/2015 revealed a polypoid 3 cm obstructing right nasal cavity mass.  PET scan on 07/05/2015 revealed a hypermetabolic lesion in the RIGHT ethmoid sinus consistent with biopsy-proven adenocarcinoma.  There was no evidence of local nodal metastasis in the neck.  There was a 1 mm nodule in the left upper lobe.  There was no evidence distant metastasis.  She received radiation therapy to the involved field and lymph node from 06/2015 - 09/2015.  She has xerostomia.  Teeth are decaying.  TSH and T4 were normal on 07/27/2016.  PET scan on 01/07/2016 revealed resolution of prior hypermetabolic lesion in the right nasal cavity/ethmoid sinus. The right lower lobe nodular opacity on screening CT scan was no longer visualized.  There was no evidence of metastatic disease.  PET scan on 01/12/2017 revealed no metabolic evidence of local tumor recurrence in the nasal cavity/paranasal sinuses. There was no metabolic evidence of neck nodal or distant metastatic disease.  Low dose chest CT on 02/10/2018 that demonstrated multiple tiny pulmonary nodules throughout the lungs bilaterally.  The largest nodule measured 3.2 mm.   There are multiple tiny calcified granulomas noted.  Lung-RADS 2S (benign appearance or behavior).    Maxofacial CT on 02/17/2018 revealed no evidence of recurrent sinonasal tumor.  Bilateral mammogram on 02/21/2018 revealed no evidence of malignancy.  Bone density on 03/14/2018 revealed osteopenia with a T-score of -2.3 in the right femoral neck.  Symptomatically, she is struggling with her dentures.  She has xerostomia.  She has gained weight.  Exam is stable.  Labs are stable.  Plan: 1.  Labs today:  CBC with diff, CMP. 2.  Stage I right nasal cavity adenocarcinoma:  Review interval maxofacial CT- no evidence of recurrent disease.  Images personally reviewed.  Agree with radiology interpretation.  Continue ongoing follow-up with ENT and radiation oncology.  Follow-up as scheduled with ENT in 08/2018 or 09/2018. 3.  Pulmonary nodules:  Etiology unclear.  Continue annual low dose chest CT imaging. 4.  Osteopenia:  Discuss interval bone density.  Discuss calcium, vitamin D, and exercise.  Follow-up with PCP. 5.  Xerostomia:  Encourage use of SalivaMax and peppermint oil PRN. 6.  Poorly fitting dentures:  Encourage follow-up with dentistry. 7.  Weight gain:  Check TSH and free T4 today. 8.  Smoking:  Patient has increased her smoking (now 1/2 pack/day).  Encourage smoking cessation. 9.  RTC  in 6 months for MD assessment, labs (CBC with diff, CMP), review of low dose chest CT.   Lequita Asal, MD  08/23/2018, 10:56 AM

## 2018-08-23 NOTE — Progress Notes (Signed)
Patient states she eats small amounts.  Feels up quickly.  Continues to have back pain.  Otherwise offers no complaints.

## 2018-09-01 DIAGNOSIS — J301 Allergic rhinitis due to pollen: Secondary | ICD-10-CM | POA: Diagnosis not present

## 2018-09-12 DIAGNOSIS — J301 Allergic rhinitis due to pollen: Secondary | ICD-10-CM | POA: Diagnosis not present

## 2018-09-19 DIAGNOSIS — J301 Allergic rhinitis due to pollen: Secondary | ICD-10-CM | POA: Diagnosis not present

## 2018-09-26 DIAGNOSIS — Z8522 Personal history of malignant neoplasm of nasal cavities, middle ear, and accessory sinuses: Secondary | ICD-10-CM | POA: Diagnosis not present

## 2018-09-26 DIAGNOSIS — J0140 Acute pansinusitis, unspecified: Secondary | ICD-10-CM | POA: Diagnosis not present

## 2018-09-29 DIAGNOSIS — J301 Allergic rhinitis due to pollen: Secondary | ICD-10-CM | POA: Diagnosis not present

## 2018-10-10 DIAGNOSIS — J301 Allergic rhinitis due to pollen: Secondary | ICD-10-CM | POA: Diagnosis not present

## 2018-10-24 DIAGNOSIS — J301 Allergic rhinitis due to pollen: Secondary | ICD-10-CM | POA: Diagnosis not present

## 2018-11-01 DIAGNOSIS — J301 Allergic rhinitis due to pollen: Secondary | ICD-10-CM | POA: Diagnosis not present

## 2018-11-03 DIAGNOSIS — J301 Allergic rhinitis due to pollen: Secondary | ICD-10-CM | POA: Diagnosis not present

## 2018-11-10 DIAGNOSIS — J301 Allergic rhinitis due to pollen: Secondary | ICD-10-CM | POA: Diagnosis not present

## 2018-11-21 DIAGNOSIS — J301 Allergic rhinitis due to pollen: Secondary | ICD-10-CM | POA: Diagnosis not present

## 2018-11-22 DIAGNOSIS — L578 Other skin changes due to chronic exposure to nonionizing radiation: Secondary | ICD-10-CM | POA: Diagnosis not present

## 2018-11-22 DIAGNOSIS — D229 Melanocytic nevi, unspecified: Secondary | ICD-10-CM | POA: Diagnosis not present

## 2018-11-22 DIAGNOSIS — L821 Other seborrheic keratosis: Secondary | ICD-10-CM | POA: Diagnosis not present

## 2018-11-22 DIAGNOSIS — L814 Other melanin hyperpigmentation: Secondary | ICD-10-CM | POA: Diagnosis not present

## 2018-11-22 DIAGNOSIS — Z1283 Encounter for screening for malignant neoplasm of skin: Secondary | ICD-10-CM | POA: Diagnosis not present

## 2018-11-22 DIAGNOSIS — Z85820 Personal history of malignant melanoma of skin: Secondary | ICD-10-CM | POA: Diagnosis not present

## 2018-11-22 DIAGNOSIS — D1800 Hemangioma unspecified site: Secondary | ICD-10-CM | POA: Diagnosis not present

## 2018-11-28 DIAGNOSIS — J301 Allergic rhinitis due to pollen: Secondary | ICD-10-CM | POA: Diagnosis not present

## 2018-12-05 DIAGNOSIS — J301 Allergic rhinitis due to pollen: Secondary | ICD-10-CM | POA: Diagnosis not present

## 2018-12-12 DIAGNOSIS — J301 Allergic rhinitis due to pollen: Secondary | ICD-10-CM | POA: Diagnosis not present

## 2018-12-22 DIAGNOSIS — J301 Allergic rhinitis due to pollen: Secondary | ICD-10-CM | POA: Diagnosis not present

## 2019-01-02 DIAGNOSIS — J309 Allergic rhinitis, unspecified: Secondary | ICD-10-CM | POA: Diagnosis not present

## 2019-01-12 DIAGNOSIS — J301 Allergic rhinitis due to pollen: Secondary | ICD-10-CM | POA: Diagnosis not present

## 2019-01-15 ENCOUNTER — Encounter: Payer: Self-pay | Admitting: *Deleted

## 2019-01-19 ENCOUNTER — Encounter: Payer: Self-pay | Admitting: *Deleted

## 2019-02-10 DIAGNOSIS — J301 Allergic rhinitis due to pollen: Secondary | ICD-10-CM | POA: Diagnosis not present

## 2019-02-14 DIAGNOSIS — J301 Allergic rhinitis due to pollen: Secondary | ICD-10-CM | POA: Diagnosis not present

## 2019-02-20 ENCOUNTER — Ambulatory Visit: Payer: Medicare PPO | Admitting: Radiation Oncology

## 2019-02-20 DIAGNOSIS — J301 Allergic rhinitis due to pollen: Secondary | ICD-10-CM | POA: Diagnosis not present

## 2019-02-23 ENCOUNTER — Other Ambulatory Visit: Payer: Self-pay | Admitting: Oncology

## 2019-02-23 ENCOUNTER — Other Ambulatory Visit: Payer: Self-pay | Admitting: *Deleted

## 2019-02-23 DIAGNOSIS — C310 Malignant neoplasm of maxillary sinus: Secondary | ICD-10-CM

## 2019-02-23 DIAGNOSIS — Z86010 Personal history of colonic polyps: Secondary | ICD-10-CM | POA: Diagnosis not present

## 2019-02-23 DIAGNOSIS — K219 Gastro-esophageal reflux disease without esophagitis: Secondary | ICD-10-CM | POA: Diagnosis not present

## 2019-03-02 ENCOUNTER — Ambulatory Visit: Payer: Medicare PPO | Admitting: Radiation Oncology

## 2019-03-02 ENCOUNTER — Other Ambulatory Visit: Payer: Medicare PPO

## 2019-03-02 ENCOUNTER — Ambulatory Visit: Payer: Medicare PPO | Admitting: Oncology

## 2019-03-02 ENCOUNTER — Ambulatory Visit: Payer: Medicare PPO | Admitting: Hematology and Oncology

## 2019-03-06 DIAGNOSIS — J301 Allergic rhinitis due to pollen: Secondary | ICD-10-CM | POA: Diagnosis not present

## 2019-03-13 DIAGNOSIS — J301 Allergic rhinitis due to pollen: Secondary | ICD-10-CM | POA: Diagnosis not present

## 2019-03-21 DIAGNOSIS — J301 Allergic rhinitis due to pollen: Secondary | ICD-10-CM | POA: Diagnosis not present

## 2019-03-22 ENCOUNTER — Telehealth: Payer: Self-pay | Admitting: *Deleted

## 2019-03-22 DIAGNOSIS — Z87891 Personal history of nicotine dependence: Secondary | ICD-10-CM

## 2019-03-22 DIAGNOSIS — Z122 Encounter for screening for malignant neoplasm of respiratory organs: Secondary | ICD-10-CM

## 2019-03-22 NOTE — Telephone Encounter (Signed)
Patient has been notified that annual lung cancer screening low dose CT scan is due currently or will be in near future. Confirmed that patient is within the age range of 55-77, and asymptomatic, (no signs or symptoms of lung cancer). Patient denies illness that would prevent curative treatment for lung cancer if found. Verified smoking history, (current, 44 pack year). The shared decision making visit was done 07/27/14. Patient is agreeable for CT scan being scheduled.

## 2019-03-27 DIAGNOSIS — R040 Epistaxis: Secondary | ICD-10-CM | POA: Diagnosis not present

## 2019-03-29 ENCOUNTER — Other Ambulatory Visit: Payer: Self-pay

## 2019-03-29 ENCOUNTER — Ambulatory Visit
Admission: RE | Admit: 2019-03-29 | Discharge: 2019-03-29 | Disposition: A | Discharge status: Home / Self Care | Payer: Medicare PPO | Source: Ambulatory Visit | Attending: Oncology | Admitting: Oncology

## 2019-03-29 DIAGNOSIS — Z122 Encounter for screening for malignant neoplasm of respiratory organs: Secondary | ICD-10-CM | POA: Insufficient documentation

## 2019-03-29 DIAGNOSIS — F17210 Nicotine dependence, cigarettes, uncomplicated: Secondary | ICD-10-CM | POA: Diagnosis not present

## 2019-03-29 DIAGNOSIS — Z87891 Personal history of nicotine dependence: Secondary | ICD-10-CM

## 2019-03-30 DIAGNOSIS — J301 Allergic rhinitis due to pollen: Secondary | ICD-10-CM | POA: Diagnosis not present

## 2019-03-31 ENCOUNTER — Other Ambulatory Visit: Payer: Self-pay | Admitting: Internal Medicine

## 2019-03-31 ENCOUNTER — Encounter: Payer: Self-pay | Admitting: *Deleted

## 2019-03-31 DIAGNOSIS — Z1231 Encounter for screening mammogram for malignant neoplasm of breast: Secondary | ICD-10-CM

## 2019-04-06 DIAGNOSIS — J301 Allergic rhinitis due to pollen: Secondary | ICD-10-CM | POA: Diagnosis not present

## 2019-04-10 ENCOUNTER — Encounter: Payer: Self-pay | Admitting: Internal Medicine

## 2019-04-10 ENCOUNTER — Ambulatory Visit (INDEPENDENT_AMBULATORY_CARE_PROVIDER_SITE_OTHER): Payer: Medicare PPO | Admitting: Internal Medicine

## 2019-04-10 ENCOUNTER — Other Ambulatory Visit: Payer: Self-pay

## 2019-04-10 VITALS — BP 122/74 | HR 63 | Temp 98.1°F | Wt 181.0 lb

## 2019-04-10 DIAGNOSIS — R609 Edema, unspecified: Secondary | ICD-10-CM

## 2019-04-10 NOTE — Addendum Note (Signed)
Addended by: Cloyd Stagers on: 04/10/2019 02:39 PM   Modules accepted: Orders

## 2019-04-10 NOTE — Progress Notes (Signed)
Subjective:    Patient ID: Heather Beasley, female    DOB: 1956/05/10, 63 y.o.   MRN: [id number]  HPI  Pt presents to the clinic today with c/o bilateral lower extremity edema. She reports this started 3 weeks ago. The left was worse than the right, however she has noticed some improvement in the swelling. She denies lower extremity weakness, numbness or tingling. She has occasional burning in her feet but was advised by her neurologist that she has some neuropathy. She denies recent change in diet, or adding salt to her food. She is not on any meds that could be a contributing factor. Echo from 06/2013 showed grade 1 diastolic dysfunction.   Review of Systems  Past Medical History:  Diagnosis Date  . Allergic rhinitis   . Arthritis   . Asthma   . Cancer ([place])    melanoma  . Cancer of nasal cavity and sinus ([place]) 07/07/2015   radiation  . Cervical spondylosis   . Colitis   . Colon polyps   . Depression   . Diverticulosis   . GERD (gastroesophageal reflux disease)   . Glaucoma   . Headaches, cluster   . Hyperlipidemia   . IBS (irritable bowel syndrome)   . Kidney stones   . Neuropathy   . PONV (postoperative nausea and vomiting)   . Tobacco abuse     Current Outpatient Medications  Medication Sig Dispense Refill  . albuterol (PROVENTIL HFA;VENTOLIN HFA) 108 (90 Base) MCG/ACT inhaler Inhale 2 puffs into the lungs every 6 (six) hours as needed for wheezing or shortness of breath. 1 Inhaler 0  . aspirin EC 81 MG tablet Take 81 mg by mouth daily.    . cetirizine (ZYRTEC) 10 MG tablet Take 10 mg by mouth daily. Allergies.    . cyclobenzaprine (FLEXERIL) 10 MG tablet Take 1 tablet (10 mg total) by mouth 3 (three) times daily. 90 tablet 6  . loperamide (IMODIUM A-D) 2 MG tablet Take 2 mg by mouth 4 (four) times daily as needed for diarrhea or loose stools.    . Multiple Vitamins-Minerals (CENTRUM ADULTS PO) Take 1 tablet by mouth.    . pantoprazole (PROTONIX) 40 MG tablet Take 1  tablet (40 mg total) by mouth 2 (two) times daily. 60 tablet 11   No current facility-administered medications for this visit.     Allergies  Allergen Reactions  . Albumin (Human) Rash  . Amoxicillin-Pot Clavulanate Hives  . Gabapentin Shortness Of Breath  . Latex Dermatitis and Rash  . Betadine [Povidone Iodine] Dermatitis, Rash and Other (See Comments)    Skin burning Topical betadine and iodine have this reaction with patient.  IV contrast is NOT a problem.  jkl  . Sucralfate     Other reaction(s): Other (See Comments) Abdominal Pain  . Amoxicillin     REACTION: rash  . Chantix  [Varenicline Tartrate]   . Chantix [Varenicline Tartrate]     Heart racing  . Erythromycin     REACTION: rash  . Sucralfate Nausea And Vomiting    Heart racing Lightheaded     Family History  Problem Relation Age of Onset  . Hypertension Mother   . Hyperlipidemia Mother   . Diabetes Mother   . Colon cancer Father   . Pancreatic cancer Father   . Cancer Father        Colon & Pancreatic  . Breast cancer Paternal Grandmother 53  . Diabetes Paternal Grandmother   . Coronary artery disease  Paternal Grandmother   . Heart failure Paternal Grandmother   . Cancer Paternal Grandmother        breast  . Stroke Brother     Social History   Socioeconomic History  . Marital status: Married    Spouse name: Not on file  . Number of children: 2  . Years of education: Not on file  . Highest education level: Not on file  Occupational History  . Occupation: Investment banker, operational  . Financial resource strain: Not on file  . Food insecurity    Worry: Not on file    Inability: Not on file  . Transportation needs    Medical: Not on file    Non-medical: Not on file  Tobacco Use  . Smoking status: Current Every Day Smoker    Packs/day: 0.25    Years: 39.00    Pack years: 9.75    Types: Cigarettes  . Smokeless tobacco: Never Used  . Tobacco comment: e cigs currently--trying to quit  Substance  and Sexual Activity  . Alcohol use: No  . Drug use: No  . Sexual activity: Not Currently    Partners: Male  Lifestyle  . Physical activity    Days per week: Not on file    Minutes per session: Not on file  . Stress: Not on file  Relationships  . Social Herbalist on phone: Not on file    Gets together: Not on file    Attends religious service: Not on file    Active member of club or organization: Not on file    Attends meetings of clubs or organizations: Not on file    Relationship status: Not on file  . Intimate partner violence    Fear of current or ex partner: Not on file    Emotionally abused: Not on file    Physically abused: Not on file    Forced sexual activity: Not on file  Other Topics Concern  . Not on file  Social History Narrative   Lives in [place] with husband. Dog in home. Work - disabled for neck and back pain.     Constitutional: Denies fever, malaise, fatigue, headache or abrupt weight changes.  Respiratory: Denies difficulty breathing, shortness of breath, cough or sputum production.   Cardiovascular: Pt reports swelling in legs. Denies chest pain, chest tightness, palpitations or swelling in the hands.  Musculoskeletal: Denies decrease in range of motion, difficulty with gait, muscle pain or joint pain and swelling.  Skin: Denies redness, rashes, lesions or ulcercations.    No other specific complaints in a complete review of systems (except as listed in HPI above).     Objective:   Physical Exam  BP 122/74   Pulse 63   Temp 98.1 F (36.7 C) (Oral)   Wt 181 lb (82.1 kg)   SpO2 97%   BMI 30.12 kg/m  Wt Readings from Last 3 Encounters:  04/10/19 181 lb (82.1 kg)  03/29/19 183 lb (83 kg)  08/23/18 169 lb 9 oz (76.9 kg)    General: Appears her stated age, well developed, well nourished in NAD. Skin: Warm, dry and intact. No rashes noted. Cardiovascular: Normal rate and rhythm. S1,S2 noted.  No murmur, rubs or gallops noted.  Trace pitting LLE edema. Pulmonary/Chest: Normal effort and positive vesicular breath sounds. No respiratory distress. No wheezes, rales or ronchi noted.  Neurological: Alert and oriented.    BMET    Component Value Date/Time   NA  139 08/23/2018 0935   K 5.0 08/23/2018 0935   CL 106 08/23/2018 0935   CO2 28 08/23/2018 0935   GLUCOSE 105 (H) 08/23/2018 0935   BUN 21 (H) 08/23/2018 0935   CREATININE 0.92 08/23/2018 0935   CALCIUM 9.1 08/23/2018 0935   GFRNONAA >60 08/23/2018 0935   GFRAA >60 08/23/2018 0935    Lipid Panel     Component Value Date/Time   CHOL 189 03/01/2018 1131   TRIG 85.0 03/01/2018 1131   HDL 58.10 03/01/2018 1131   CHOLHDL 3 03/01/2018 1131   VLDL 17.0 03/01/2018 1131   LDLCALC 114 (H) 03/01/2018 1131    CBC    Component Value Date/Time   WBC 3.8 (L) 08/23/2018 0935   RBC 4.47 08/23/2018 0935   HGB 13.6 08/23/2018 0935   HCT 42.9 08/23/2018 0935   PLT 208 08/23/2018 0935   MCV 96.0 08/23/2018 0935   MCH 30.4 08/23/2018 0935   MCHC 31.7 08/23/2018 0935   RDW 12.8 08/23/2018 0935   LYMPHSABS 1.0 08/23/2018 0935   MONOABS 0.3 08/23/2018 0935   EOSABS 0.2 08/23/2018 0935   BASOSABS 0.0 08/23/2018 0935    Hgb A1C No results found for: HGBA1C         Assessment & Plan:   Peripheral Edema:  BMET today Discussed DASH diet, exercise for weight loss Encouraged elevation when possible If kidney function normal, will give RX for HCTZ 12.5 mg daily prn for edema  Will follow up after labs, return precautions discussed [name], NP

## 2019-04-10 NOTE — Patient Instructions (Signed)

## 2019-04-11 ENCOUNTER — Telehealth: Payer: Self-pay | Admitting: Internal Medicine

## 2019-04-11 ENCOUNTER — Other Ambulatory Visit: Payer: Self-pay | Admitting: Internal Medicine

## 2019-04-11 DIAGNOSIS — R609 Edema, unspecified: Secondary | ICD-10-CM

## 2019-04-11 LAB — BASIC METABOLIC PANEL
BUN/Creatinine Ratio: 19 (ref 12–28)
BUN: 16 mg/dL (ref 8–27)
CO2: 23 mmol/L (ref 20–29)
Calcium: 9.5 mg/dL (ref 8.7–10.3)
Chloride: 100 mmol/L (ref 96–106)
Creatinine, Ser: 0.85 mg/dL (ref 0.57–1.00)
GFR calc Af Amer: 86 mL/min/{1.73_m2} (ref >59)
GFR calc non Af Amer: 75 mL/min/{1.73_m2} (ref >59)
Glucose: 88 mg/dL (ref 65–99)
Potassium: 5.3 mmol/L — ABNORMAL HIGH (ref 3.5–5.2)
Sodium: 138 mmol/L (ref 134–144)

## 2019-04-11 MED ORDER — HYDROCHLOROTHIAZIDE 12.5 MG PO CAPS: 12.5000 mg | ORAL_CAPSULE | Freq: Every day | ORAL | 0 refills | Status: DC | PRN

## 2019-04-11 NOTE — Telephone Encounter (Signed)
Patient stated that a prescription was going to be sent over to [facility] yesterday for a fluid pill and when calling them this morning they have not received anything.   Patient's c/b number- [phone]

## 2019-04-11 NOTE — Telephone Encounter (Signed)
Patient notified of results by mychart.  HCTZ script sent to pharmacy

## 2019-04-13 DIAGNOSIS — J301 Allergic rhinitis due to pollen: Secondary | ICD-10-CM | POA: Diagnosis not present

## 2019-04-20 DIAGNOSIS — J301 Allergic rhinitis due to pollen: Secondary | ICD-10-CM | POA: Diagnosis not present

## 2019-04-25 ENCOUNTER — Other Ambulatory Visit (INDEPENDENT_AMBULATORY_CARE_PROVIDER_SITE_OTHER): Payer: Medicare PPO

## 2019-04-25 DIAGNOSIS — R609 Edema, unspecified: Secondary | ICD-10-CM

## 2019-04-25 DIAGNOSIS — R600 Localized edema: Secondary | ICD-10-CM

## 2019-04-25 LAB — BASIC METABOLIC PANEL
BUN: 18 mg/dL (ref 6–23)
CO2: 31 mEq/L (ref 19–32)
Calcium: 9.5 mg/dL (ref 8.4–10.5)
Chloride: 103 mEq/L (ref 96–112)
Creatinine, Ser: 0.87 mg/dL (ref 0.40–1.20)
GFR: 66.22 mL/min (ref >60.00)
Glucose, Bld: 120 mg/dL — ABNORMAL HIGH (ref 70–99)
Potassium: 4.4 mEq/L (ref 3.5–5.1)
Sodium: 140 mEq/L (ref 135–145)

## 2019-04-25 NOTE — Addendum Note (Signed)
Addended by: Lurlean Nanny on: 04/25/2019 01:52 PM   Modules accepted: Orders

## 2019-04-27 DIAGNOSIS — J301 Allergic rhinitis due to pollen: Secondary | ICD-10-CM | POA: Diagnosis not present

## 2019-05-05 DIAGNOSIS — J301 Allergic rhinitis due to pollen: Secondary | ICD-10-CM | POA: Diagnosis not present

## 2019-05-10 DIAGNOSIS — J301 Allergic rhinitis due to pollen: Secondary | ICD-10-CM | POA: Diagnosis not present

## 2019-05-12 ENCOUNTER — Ambulatory Visit
Admission: RE | Admit: 2019-05-12 | Discharge: 2019-05-12 | Disposition: A | Discharge status: Home / Self Care | Payer: Medicare PPO | Source: Ambulatory Visit | Attending: Internal Medicine | Admitting: Internal Medicine

## 2019-05-12 ENCOUNTER — Other Ambulatory Visit: Payer: Self-pay

## 2019-05-12 DIAGNOSIS — Z1231 Encounter for screening mammogram for malignant neoplasm of breast: Secondary | ICD-10-CM | POA: Insufficient documentation

## 2019-05-15 DIAGNOSIS — J301 Allergic rhinitis due to pollen: Secondary | ICD-10-CM | POA: Diagnosis not present

## 2019-05-17 DIAGNOSIS — L239 Allergic contact dermatitis, unspecified cause: Secondary | ICD-10-CM | POA: Diagnosis not present

## 2019-05-17 DIAGNOSIS — D485 Neoplasm of uncertain behavior of skin: Secondary | ICD-10-CM | POA: Diagnosis not present

## 2019-05-17 DIAGNOSIS — R21 Rash and other nonspecific skin eruption: Secondary | ICD-10-CM | POA: Diagnosis not present

## 2019-05-17 DIAGNOSIS — L820 Inflamed seborrheic keratosis: Secondary | ICD-10-CM | POA: Diagnosis not present

## 2019-05-17 DIAGNOSIS — D1800 Hemangioma unspecified site: Secondary | ICD-10-CM | POA: Diagnosis not present

## 2019-05-21 NOTE — Progress Notes (Signed)
[place]  [phone] [phone]) [phone]  ID: Heather Beasley OB: 08/11/1956  MR#: [id number]  DEY#:[id number]  Patient Care Team: Jearld Fenton, NP as PCP - General (Internal Medicine) Earnie Larsson, MD (Neurosurgery) Leeroy Cha, MD (Neurosurgery) Manya Silvas, MD (Gastroenterology) Jonette Mate, MD (Inactive) (General Surgery) Noreene Filbert, MD as Referring Physician (Radiation Oncology)  CHIEF COMPLAINT: Stage I right nasal cavity adenocarcinoma.  INTERVAL HISTORY: Patient is a 63 year old female who was recently evaluated by another provider who returns to clinic today for further evaluation and discussion of her imaging results.  Her initial diagnosis was in 2016.  She has complaints of significant unintentional weight gain as well as "horrible odor" emanating from her body.  She otherwise feels well.  She has no neurologic complaints.  She denies any recent fevers or illnesses.  She denies any chest pain, shortness of breath, cough, or hemoptysis.  She denies any nausea, vomiting, constipation, or diarrhea.  She has no urinary complaints.  Patient otherwise feels well and offers no further specific complaints today.  REVIEW OF SYSTEMS:   Review of Systems  Constitutional: Negative.  Negative for fever, malaise/fatigue and weight loss.  Respiratory: Negative.  Negative for cough, hemoptysis and shortness of breath.   Cardiovascular: Negative.  Negative for chest pain and leg swelling.  Gastrointestinal: Negative.  Negative for abdominal pain.  Genitourinary: Negative.  Negative for dysuria.  Musculoskeletal: Negative.  Negative for back pain.  Skin: Negative.  Negative for rash.  Neurological: Negative.  Negative for dizziness, focal weakness, weakness and headaches.  Psychiatric/Behavioral: Negative.  The patient is not nervous/anxious.     As per HPI. Otherwise, a complete review of systems is negative.  PAST MEDICAL  HISTORY: Past Medical History:  Diagnosis Date  . Allergic rhinitis   . Arthritis   . Asthma   . Cancer ([place])    melanoma  . Cancer of nasal cavity and sinus ([place]) 07/07/2015   radiation  . Cervical spondylosis   . Colitis   . Colon polyps   . Depression   . Diverticulosis   . GERD (gastroesophageal reflux disease)   . Glaucoma   . Headaches, cluster   . Hyperlipidemia   . IBS (irritable bowel syndrome)   . Kidney stones   . Neuropathy   . PONV (postoperative nausea and vomiting)   . Tobacco abuse     PAST SURGICAL HISTORY: Past Surgical History:  Procedure Laterality Date  . ABDOMINAL HYSTERECTOMY  1998  . APPENDECTOMY  1998  . ARM FX REPAIR  2001  . BACK SURGERY    . CERVICAL FUSION  2010  . CHOLECYSTECTOMY  08/2011   Dr Rochel Brome  . COLONOSCOPY     Dr Tiffany Kocher  . CYSTOSCOPY     Dr. Bernardo Heater  . DILATION AND CURETTAGE OF UTERUS    . ESOPHAGOGASTRODUODENOSCOPY  2012   Dr Tiffany Kocher  . FRACTURE SURGERY  2001   plate in right wrist and arm  . IMAGE GUIDED SINUS SURGERY N/A 06/25/2015   Procedure: IMAGE GUIDED SINUS SURGERY;  Surgeon: Beverly Gust, MD;  Location: [facility] ORS;  Service: ENT;  Laterality: N/A;  . LUMBAR SPINE SURGERY  07/2004  . OOPHORECTOMY    . POLYPECTOMY N/A 06/25/2015   Procedure: POLYPECTOMY NASAL;  Surgeon: Beverly Gust, MD;  Location: [facility] ORS;  Service: ENT;  Laterality: N/A;  . [place]  . VAGINAL DELIVERY     x2    FAMILY HISTORY: Family  History  Problem Relation Age of Onset  . Hypertension Mother   . Hyperlipidemia Mother   . Diabetes Mother   . Colon cancer Father   . Pancreatic cancer Father   . Cancer Father        Colon & Pancreatic  . Breast cancer Paternal Grandmother 47  . Diabetes Paternal Grandmother   . Coronary artery disease Paternal Grandmother   . Heart failure Paternal Grandmother   . Cancer Paternal Grandmother        breast  . Stroke Brother     ADVANCED DIRECTIVES (Y/N):  N  HEALTH  MAINTENANCE: Social History   Tobacco Use  . Smoking status: Current Every Day Smoker    Packs/day: 0.25    Years: 39.00    Pack years: 9.75    Types: Cigarettes  . Smokeless tobacco: Never Used  . Tobacco comment: e cigs currently--trying to quit  Substance Use Topics  . Alcohol use: No  . Drug use: No     Colonoscopy:  PAP:  Bone density:  Lipid panel:  Allergies  Allergen Reactions  . Albumin (Human) Rash  . Amoxicillin-Pot Clavulanate Hives  . Gabapentin Shortness Of Breath  . Latex Dermatitis and Rash  . Betadine [Povidone Iodine] Dermatitis, Rash and Other (See Comments)    Skin burning Topical betadine and iodine have this reaction with patient.  IV contrast is NOT a problem.  jkl  . Sucralfate     Other reaction(s): Other (See Comments) Abdominal Pain  . Amoxicillin     REACTION: rash  . Chantix  [Varenicline Tartrate]   . Chantix [Varenicline Tartrate]     Heart racing  . Erythromycin     REACTION: rash  . Sucralfate Nausea And Vomiting    Heart racing Lightheaded     Current Outpatient Medications  Medication Sig Dispense Refill  . albuterol (PROVENTIL HFA;VENTOLIN HFA) 108 (90 Base) MCG/ACT inhaler Inhale 2 puffs into the lungs every 6 (six) hours as needed for wheezing or shortness of breath. 1 Inhaler 0  . aspirin EC 81 MG tablet Take 81 mg by mouth daily.    . cetirizine (ZYRTEC) 10 MG tablet Take 10 mg by mouth daily. Allergies.    . cyclobenzaprine (FLEXERIL) 10 MG tablet Take 1 tablet (10 mg total) by mouth 3 (three) times daily. 90 tablet 6  . EPINEPHrine 0.3 mg/0.3 mL IJ SOAJ injection Inject 1 mg into the skin as needed.    . loperamide (IMODIUM A-D) 2 MG tablet Take 2 mg by mouth 2 (two) times a day.     . pantoprazole (PROTONIX) 40 MG tablet Take 1 tablet (40 mg total) by mouth 2 (two) times daily. 60 tablet 11  . hydrochlorothiazide (MICROZIDE) 12.5 MG capsule Take 1 capsule (12.5 mg total) by mouth daily as needed. (Patient not taking:  Reported on 05/25/2019) 30 capsule 0  . Multiple Vitamins-Minerals (CENTRUM ADULTS PO) Take 1 tablet by mouth.     No current facility-administered medications for this visit.     OBJECTIVE: Vitals:   05/25/19 1032  BP: (!) 142/86  Pulse: 78  Temp: (!) 94.8 F (34.9 C)     Body mass index is 30.65 kg/m.    ECOG FS:0 - Asymptomatic  General: Well-developed, well-nourished, no acute distress. Eyes: Pink conjunctiva, anicteric sclera. HEENT: Normocephalic, moist mucous membranes, clear oropharnyx. Lungs: Clear to auscultation bilaterally. Heart: Regular rate and rhythm. No rubs, murmurs, or gallops. Abdomen: Soft, nontender, nondistended. No organomegaly noted, normoactive bowel  sounds. Musculoskeletal: No edema, cyanosis, or clubbing. Neuro: Alert, answering all questions appropriately. Cranial nerves grossly intact. Skin: No rashes or petechiae noted. Psych: Normal affect. Lymphatics: No cervical, calvicular, axillary or inguinal LAD.   LAB RESULTS:  Lab Results  Component Value Date   NA 140 05/25/2019   K 4.9 05/25/2019   CL 105 05/25/2019   CO2 27 05/25/2019   GLUCOSE 107 (H) 05/25/2019   BUN 12 05/25/2019   CREATININE 0.94 05/25/2019   CALCIUM 9.3 05/25/2019   PROT 7.0 05/25/2019   ALBUMIN 3.9 05/25/2019   AST 21 05/25/2019   ALT 15 05/25/2019   ALKPHOS 98 05/25/2019   BILITOT 0.8 05/25/2019   GFRNONAA >60 05/25/2019   GFRAA >60 05/25/2019    Lab Results  Component Value Date   WBC 4.6 05/25/2019   NEUTROABS 2.9 05/25/2019   HGB 14.0 05/25/2019   HCT 43.8 05/25/2019   MCV 94.0 05/25/2019   PLT 216 05/25/2019     STUDIES: Ct Maxillofacial W Contrast  Result Date: 05/22/2019 CLINICAL DATA:  Follow-up adenocarcinoma maxillary sinus 2016. EXAM: CT MAXILLOFACIAL WITH CONTRAST TECHNIQUE: Multidetector CT imaging of the maxillofacial structures was performed with intravenous contrast. Multiplanar CT image reconstructions were also generated. CONTRAST:  63mL  OMNIPAQUE IOHEXOL 300 MG/ML  SOLN COMPARISON:  CT face 02/17/2018 FINDINGS: Osseous: Negative for fracture.  Negative for bone destruction. Orbits: Negative for mass or edema Sinuses: Postop resection of mass of the right posterior nasal cavity. Residual soft tissue along the posteroinferior turbinates is slightly more prominent on the right the left but stable from the prior CT. No definite mass lesion. Slight asymmetric soft tissue to the right of the posterior nasal septum also unchanged. Soft tissues: Facial soft tissues normal.  No adenopathy identified Limited intracranial: Negative IMPRESSION: No change from the prior CT. Resection of carcinoma right posterior nasal cavity. Mild asymmetry of soft tissue involving the right inferior turbinate posteriorly unchanged from the prior study. Direct mucosal inspection suggested for surveillance purposes. Electronically Signed   By: Franchot Gallo M.D.   On: 05/22/2019 13:48   Mm 3d Screen Breast Bilateral  Result Date: 05/12/2019 CLINICAL DATA:  Screening. EXAM: DIGITAL SCREENING BILATERAL MAMMOGRAM WITH TOMO AND CAD COMPARISON:  Previous exam(s). ACR Breast Density Category b: There are scattered areas of fibroglandular density. FINDINGS: There are no findings suspicious for malignancy. Images were processed with CAD. IMPRESSION: No mammographic evidence of malignancy. A result letter of this screening mammogram will be mailed directly to the patient. RECOMMENDATION: Screening mammogram in one year. (Code:SM-B-01Y) BI-RADS CATEGORY  1: Negative. Electronically Signed   By: Abelardo Diesel M.D.   On: 05/12/2019 13:27    ASSESSMENT: Stage I right nasal cavity adenocarcinoma.  PLAN:    1. Stage I right nasal cavity adenocarcinoma: Patient completed adjuvant XRT in 2016.  She did not require chemotherapy.  Her most recent CT scan on May 22, 2019 reviewed independently and report as above with no obvious evidence of recurrent or progressive disease.  No  intervention is needed.  Return to clinic in 1 year with repeat imaging and further evaluation.  If everything remains stable, patient can likely be discharged from clinic. 2.  Lung cancer screening: Patient continues to actively smoke.  Continue follow-up yearly low-dose CT scan of the chest. 3.  Weight gain/body odor: Unclear etiology.  Have recommended patient ask primary care for endocrine referral.  I spent a total of 30 minutes face-to-face with the patient of which greater than 50%  of the visit was spent in counseling and coordination of care as detailed above.   Patient expressed understanding and was in agreement with this plan. She also understands that She can call clinic at any time with any questions, concerns, or complaints.   Cancer Staging Cancer of nasal cavity and sinus ([place]) Staging form: Nasal Cavity and Ethmoid Sinus, AJCC 7th Edition - Clinical: Stage I (T1, N0, M0) - Unsigned   Lloyd Huger, MD   05/25/2019 11:48 AM

## 2019-05-22 ENCOUNTER — Other Ambulatory Visit: Payer: Self-pay

## 2019-05-22 ENCOUNTER — Ambulatory Visit
Admission: RE | Admit: 2019-05-22 | Discharge: 2019-05-22 | Disposition: A | Discharge status: Home / Self Care | Payer: Medicare PPO | Source: Ambulatory Visit | Attending: Oncology | Admitting: Oncology

## 2019-05-22 DIAGNOSIS — J301 Allergic rhinitis due to pollen: Secondary | ICD-10-CM | POA: Diagnosis not present

## 2019-05-22 DIAGNOSIS — R220 Localized swelling, mass and lump, head: Secondary | ICD-10-CM | POA: Diagnosis not present

## 2019-05-22 DIAGNOSIS — C310 Malignant neoplasm of maxillary sinus: Secondary | ICD-10-CM | POA: Diagnosis not present

## 2019-05-22 DIAGNOSIS — C300 Malignant neoplasm of nasal cavity: Secondary | ICD-10-CM | POA: Diagnosis not present

## 2019-05-22 MED ORDER — IOHEXOL 300 MG/ML  SOLN
75.0000 mL | Freq: Once | INTRAMUSCULAR | Status: AC | PRN
  Administered 2019-05-22: 10:00:00 75 mL via INTRAVENOUS

## 2019-05-25 ENCOUNTER — Encounter: Payer: Self-pay | Admitting: Oncology

## 2019-05-25 ENCOUNTER — Inpatient Hospital Stay: Payer: Medicare PPO | Attending: Oncology

## 2019-05-25 ENCOUNTER — Ambulatory Visit
Admission: RE | Admit: 2019-05-25 | Discharge: 2019-05-25 | Disposition: A | Discharge status: Home / Self Care | Payer: Medicare PPO | Source: Ambulatory Visit | Attending: Radiation Oncology | Admitting: Radiation Oncology

## 2019-05-25 ENCOUNTER — Inpatient Hospital Stay: Payer: Medicare PPO | Admitting: Oncology

## 2019-05-25 ENCOUNTER — Other Ambulatory Visit: Payer: Self-pay

## 2019-05-25 VITALS — BP 142/86 | HR 78 | Temp 94.8°F | Ht 65.0 in | Wt 184.2 lb

## 2019-05-25 DIAGNOSIS — Z720 Tobacco use: Secondary | ICD-10-CM | POA: Insufficient documentation

## 2019-05-25 DIAGNOSIS — C319 Malignant neoplasm of accessory sinus, unspecified: Secondary | ICD-10-CM

## 2019-05-25 DIAGNOSIS — C300 Malignant neoplasm of nasal cavity: Secondary | ICD-10-CM | POA: Insufficient documentation

## 2019-05-25 DIAGNOSIS — R635 Abnormal weight gain: Secondary | ICD-10-CM | POA: Insufficient documentation

## 2019-05-25 DIAGNOSIS — L750 Bromhidrosis: Secondary | ICD-10-CM | POA: Diagnosis not present

## 2019-05-25 DIAGNOSIS — C760 Malignant neoplasm of head, face and neck: Secondary | ICD-10-CM

## 2019-05-25 DIAGNOSIS — Z8522 Personal history of malignant neoplasm of nasal cavities, middle ear, and accessory sinuses: Secondary | ICD-10-CM | POA: Diagnosis not present

## 2019-05-25 LAB — CBC WITH DIFFERENTIAL/PLATELET
Abs Immature Granulocytes: 0.01 10*3/uL (ref 0.00–0.07)
Basophils Absolute: 0 10*3/uL (ref 0.0–0.1)
Basophils Relative: 0 %
Eosinophils Absolute: 0.2 10*3/uL (ref 0.0–0.5)
Eosinophils Relative: 4 %
HCT: 43.8 % (ref 36.0–46.0)
Hemoglobin: 14 g/dL (ref 12.0–15.0)
Immature Granulocytes: 0 %
Lymphocytes Relative: 25 %
Lymphs Abs: 1.1 10*3/uL (ref 0.7–4.0)
MCH: 30 pg (ref 26.0–34.0)
MCHC: 32 g/dL (ref 30.0–36.0)
MCV: 94 fL (ref 80.0–100.0)
Monocytes Absolute: 0.3 10*3/uL (ref 0.1–1.0)
Monocytes Relative: 8 %
Neutro Abs: 2.9 10*3/uL (ref 1.7–7.7)
Neutrophils Relative %: 63 %
Platelets: 216 10*3/uL (ref 150–400)
RBC: 4.66 MIL/uL (ref 3.87–5.11)
RDW: 13.2 % (ref 11.5–15.5)
WBC: 4.6 10*3/uL (ref 4.0–10.5)
nRBC: 0 % (ref 0.0–0.2)

## 2019-05-25 LAB — COMPREHENSIVE METABOLIC PANEL
ALT: 15 U/L (ref 0–44)
AST: 21 U/L (ref 15–41)
Albumin: 3.9 g/dL (ref 3.5–5.0)
Alkaline Phosphatase: 98 U/L (ref 38–126)
Anion gap: 8 (ref 5–15)
BUN: 12 mg/dL (ref 6–20)
CO2: 27 mmol/L (ref 22–32)
Calcium: 9.3 mg/dL (ref 8.9–10.3)
Chloride: 105 mmol/L (ref 98–111)
Creatinine, Ser: 0.94 mg/dL (ref 0.44–1.00)
GFR calc Af Amer: >60 mL/min (ref >60)
GFR calc non Af Amer: >60 mL/min (ref >60)
Glucose, Bld: 107 mg/dL — ABNORMAL HIGH (ref 70–99)
Potassium: 4.9 mmol/L (ref 3.5–5.1)
Sodium: 140 mmol/L (ref 135–145)
Total Bilirubin: 0.8 mg/dL (ref 0.3–1.2)
Total Protein: 7 g/dL (ref 6.5–8.1)

## 2019-05-25 NOTE — Progress Notes (Signed)
Radiation Oncology Follow up Note  Name: Heather Beasley   Date:   05/25/2019 MRN:  [id number] DOB: December 28, 1955    This 63 y.o. 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  REFERRING PROVIDER: Jearld Fenton, NP  HPI: Patient is a 63 year old female now out 1 year having completed concurrent chemoradiation therapy for stage III a squamous cell carcinoma the ethmoid sinus seen today in routine follow-up from a head and neck standpoint she is doing well specifically denies any nasal symptoms head and neck pain or dysphasia.  She does complain of multiple somatic complaints including swelling in her lower extremities which she is just been put on a "fluid pill for.  She also states she is losing weight.  She recently had a CT scan of the chest which was negative also CT scan of the sinus showing no evidence of disease.  She is also had mammograms showing no breast pathology..  COMPLICATIONS OF TREATMENT: none  FOLLOW UP COMPLIANCE: keeps appointments   PHYSICAL EXAM:  There were no vitals taken for this visit. No evidence of cervical or supraclavicular adenopathy is identified.  Well-developed well-nourished patient in NAD. HEENT reveals PERLA, EOMI, discs not visualized.  Oral cavity is clear. No oral mucosal lesions are identified. Neck is clear without evidence of cervical or supraclavicular adenopathy. Lungs are clear to A&P. Cardiac examination is essentially unremarkable with regular rate and rhythm without murmur rub or thrill. Abdomen is benign with no organomegaly or masses noted. Motor sensory and DTR levels are equal and symmetric in the upper and lower extremities. Cranial nerves II through XII are grossly intact. Proprioception is intact. No peripheral adenopathy or edema is identified. No motor or sensory levels are noted. Crude visual fields are within normal  range.  RADIOLOGY RESULTS: CT scans of chest and sinus region reviewed breast mammograms reviewed all compatible with above-stated findings.  PLAN: Present time she continues to do well with no evidence of disease in the head and neck region.  Also CT scan of chest and bilateral mammograms are normal.  I am otherwise pleased with her overall progress.  She continues follow-up care with her PMD.  I have asked to see her back in 1 year for follow-up.  Patient knows to call with any concerns at any time.  I would like to take this opportunity to thank you for allowing me to participate in the care of your patient.Noreene Filbert, MD

## 2019-05-25 NOTE — Progress Notes (Signed)
Patient stated that she had been doing well. 

## 2019-05-26 ENCOUNTER — Encounter: Payer: Self-pay | Admitting: Oncology

## 2019-05-30 DIAGNOSIS — J301 Allergic rhinitis due to pollen: Secondary | ICD-10-CM | POA: Diagnosis not present

## 2019-06-28 DIAGNOSIS — L258 Unspecified contact dermatitis due to other agents: Secondary | ICD-10-CM | POA: Diagnosis not present

## 2019-06-28 DIAGNOSIS — Z85820 Personal history of malignant melanoma of skin: Secondary | ICD-10-CM | POA: Diagnosis not present

## 2019-07-28 DIAGNOSIS — J301 Allergic rhinitis due to pollen: Secondary | ICD-10-CM | POA: Diagnosis not present

## 2019-08-03 DIAGNOSIS — J301 Allergic rhinitis due to pollen: Secondary | ICD-10-CM | POA: Diagnosis not present

## 2019-08-08 ENCOUNTER — Other Ambulatory Visit: Payer: Self-pay | Admitting: Internal Medicine

## 2019-08-08 DIAGNOSIS — R05 Cough: Secondary | ICD-10-CM | POA: Diagnosis not present

## 2019-08-08 DIAGNOSIS — J0140 Acute pansinusitis, unspecified: Secondary | ICD-10-CM | POA: Diagnosis not present

## 2019-08-23 ENCOUNTER — Other Ambulatory Visit: Payer: Self-pay

## 2019-08-23 ENCOUNTER — Ambulatory Visit
Admission: RE | Admit: 2019-08-23 | Discharge: 2019-08-23 | Disposition: A | Discharge status: Home / Self Care | Payer: Medicare PPO | Source: Ambulatory Visit | Attending: Unknown Physician Specialty | Admitting: Unknown Physician Specialty

## 2019-08-23 ENCOUNTER — Other Ambulatory Visit: Payer: Self-pay | Admitting: Unknown Physician Specialty

## 2019-08-23 ENCOUNTER — Ambulatory Visit
Admission: RE | Admit: 2019-08-23 | Discharge: 2019-08-23 | Disposition: A | Discharge status: Home / Self Care | Payer: Medicare PPO | Attending: Unknown Physician Specialty | Admitting: Unknown Physician Specialty

## 2019-08-23 DIAGNOSIS — R059 Cough, unspecified: Secondary | ICD-10-CM

## 2019-08-23 DIAGNOSIS — R05 Cough: Secondary | ICD-10-CM

## 2019-10-04 DIAGNOSIS — R05 Cough: Secondary | ICD-10-CM | POA: Diagnosis not present

## 2019-10-04 DIAGNOSIS — J340 Abscess, furuncle and carbuncle of nose: Secondary | ICD-10-CM | POA: Diagnosis not present

## 2020-03-07 DIAGNOSIS — Z8719 Personal history of other diseases of the digestive system: Secondary | ICD-10-CM | POA: Diagnosis not present

## 2020-03-07 DIAGNOSIS — K219 Gastro-esophageal reflux disease without esophagitis: Secondary | ICD-10-CM | POA: Diagnosis not present

## 2020-03-07 DIAGNOSIS — R102 Pelvic and perineal pain: Secondary | ICD-10-CM | POA: Diagnosis not present

## 2020-03-07 DIAGNOSIS — Z86010 Personal history of colonic polyps: Secondary | ICD-10-CM | POA: Diagnosis not present

## 2020-03-07 DIAGNOSIS — R197 Diarrhea, unspecified: Secondary | ICD-10-CM | POA: Diagnosis not present

## 2020-03-07 DIAGNOSIS — K529 Noninfective gastroenteritis and colitis, unspecified: Secondary | ICD-10-CM | POA: Diagnosis not present

## 2020-03-07 DIAGNOSIS — Z01812 Encounter for preprocedural laboratory examination: Secondary | ICD-10-CM | POA: Diagnosis not present

## 2020-03-08 ENCOUNTER — Other Ambulatory Visit: Payer: Self-pay | Admitting: Internal Medicine

## 2020-03-08 DIAGNOSIS — Z1231 Encounter for screening mammogram for malignant neoplasm of breast: Secondary | ICD-10-CM

## 2020-03-14 ENCOUNTER — Other Ambulatory Visit: Payer: Self-pay

## 2020-03-14 ENCOUNTER — Ambulatory Visit: Payer: Medicare PPO | Admitting: Dermatology

## 2020-03-14 DIAGNOSIS — H61002 Unspecified perichondritis of left external ear: Secondary | ICD-10-CM | POA: Diagnosis not present

## 2020-03-14 DIAGNOSIS — I8310 Varicose veins of unspecified lower extremity with inflammation: Secondary | ICD-10-CM

## 2020-03-14 DIAGNOSIS — D1801 Hemangioma of skin and subcutaneous tissue: Secondary | ICD-10-CM

## 2020-03-14 DIAGNOSIS — Z85820 Personal history of malignant melanoma of skin: Secondary | ICD-10-CM

## 2020-03-14 DIAGNOSIS — L821 Other seborrheic keratosis: Secondary | ICD-10-CM

## 2020-03-14 DIAGNOSIS — Z1283 Encounter for screening for malignant neoplasm of skin: Secondary | ICD-10-CM

## 2020-03-14 DIAGNOSIS — D229 Melanocytic nevi, unspecified: Secondary | ICD-10-CM

## 2020-03-14 DIAGNOSIS — I872 Venous insufficiency (chronic) (peripheral): Secondary | ICD-10-CM

## 2020-03-14 DIAGNOSIS — L578 Other skin changes due to chronic exposure to nonionizing radiation: Secondary | ICD-10-CM | POA: Diagnosis not present

## 2020-03-14 DIAGNOSIS — L570 Actinic keratosis: Secondary | ICD-10-CM | POA: Diagnosis not present

## 2020-03-14 DIAGNOSIS — L814 Other melanin hyperpigmentation: Secondary | ICD-10-CM | POA: Diagnosis not present

## 2020-03-14 NOTE — Progress Notes (Signed)
Follow-Up Visit   Subjective  Heather Beasley is a 64 y.o. female who presents for the following: Annual Exam (Patient has a history of MM on the L knee treated by Dr. Sharlett Iles many years ago). Patient presents for total-body skin exam for skin cancer screening and mole check.  The following portions of the chart were reviewed this encounter and updated as appropriate:  Tobacco  Allergies  Meds  Problems  Med Hx  Surg Hx  Fam Hx     Review of Systems:  No other skin or systemic complaints except as noted in HPI or Assessment and Plan.  Objective  Well appearing patient in no apparent distress; mood and affect are within normal limits.  A full examination was performed including scalp, head, eyes, ears, nose, lips, neck, chest, axillae, abdomen, back, buttocks, bilateral upper extremities, bilateral lower extremities, hands, feet, fingers, toes, fingernails, and toenails. All findings within normal limits unless otherwise noted below.  Objective  R nose: Erythematous thin papules/macules with gritty scale.   Objective  Left Ear: Clear  Objective  B/L lower leg: Erythematous, scaly patches involving the ankle and distal lower leg with associated lower leg edema.    Assessment & Plan    Actinic Damage - diffuse scaly erythematous macules with underlying dyspigmentation - Recommend daily broad spectrum sunscreen SPF 30+ to sun-exposed areas, reapply every 2 hours as needed.  - Call for new or changing lesions.   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 Benign, observe.    Skin cancer screening  Venous stasis dermatitis of left lower extremity B/L lower leg  Benign, observe.     Lentigines - Scattered tan macules - Discussed due to sun exposure - Benign, observe - Call for any changes  Seborrheic Keratoses - Stuck-on, waxy, tan-brown papules and plaques  - Discussed benign etiology and prognosis. - Observe - Call for any changes  Melanocytic Nevi - Tan-brown and/or pink-flesh-colored symmetric macules and papules - Benign appearing on exam today - Observation - Call clinic for new or changing moles - Recommend daily use of broad spectrum spf 30+ sunscreen to sun-exposed areas.   Hemangiomas - Red papules - Discussed benign nature - Observe - Call for any changes  Actinic Damage - diffuse scaly erythematous macules with underlying dyspigmentation - Recommend daily broad spectrum sunscreen SPF 30+ to sun-exposed areas, reapply every 2 hours as needed.  - Call for new or changing lesions.  Varicose Veins - Dilated blue, purple or red veins at the lower extremities - Reassured - These can be treated by sclerotherapy (a procedure to inject a medicine into the veins to make them disappear) if desired, but the treatment is not covered by insurance  History of Melanoma - L knee - No evidence of recurrence today - No lymphadenopathy - Recommend regular full body skin exams - Recommend daily broad spectrum sunscreen SPF 30+ to sun-exposed areas, reapply every 2 hours as needed.  - Call if any new or  changing lesions are noted between office visits   Skin cancer screening performed today.  Return in about 1 year (around 03/14/2021) for TBSE - hx MM L knee.  Luther Redo, CMA, am acting as scribe for Sarina Ser, MD . Documentation: I have reviewed the above documentation for accuracy and completeness, and  I agree with the above.  Sarina Ser, MD

## 2020-03-17 ENCOUNTER — Encounter: Payer: Self-pay | Admitting: Dermatology

## 2020-03-19 ENCOUNTER — Telehealth: Payer: Self-pay

## 2020-03-19 NOTE — Telephone Encounter (Signed)
Message left notifying patient that it is time to schedule the low dose lung cancer screening CT scan.  Instructed patient to return call to Shawn Perkins at [phone] to verify information prior to CT scan being scheduled.    

## 2020-03-21 ENCOUNTER — Other Ambulatory Visit: Payer: Self-pay

## 2020-03-21 ENCOUNTER — Ambulatory Visit (INDEPENDENT_AMBULATORY_CARE_PROVIDER_SITE_OTHER): Payer: Medicare PPO | Admitting: Obstetrics & Gynecology

## 2020-03-21 ENCOUNTER — Encounter: Payer: Self-pay | Admitting: Obstetrics & Gynecology

## 2020-03-21 ENCOUNTER — Telehealth: Payer: Self-pay | Admitting: Obstetrics & Gynecology

## 2020-03-21 DIAGNOSIS — N8189 Other female genital prolapse: Secondary | ICD-10-CM

## 2020-03-21 DIAGNOSIS — N3946 Mixed incontinence: Secondary | ICD-10-CM | POA: Diagnosis not present

## 2020-03-21 MED ORDER — TOLTERODINE TARTRATE ER 2 MG PO CP24: 2.0000 mg | ORAL_CAPSULE | Freq: Every day | ORAL | 11 refills | Status: DC

## 2020-03-21 MED ORDER — MIRABEGRON ER 25 MG PO TB24: 25.0000 mg | ORAL_TABLET | Freq: Every day | ORAL | 11 refills | Status: DC

## 2020-03-21 NOTE — Progress Notes (Signed)
Cystocele/Rectocele Patient is a 64 yo WF s/p hysterectomy 1998 who now complains of symptoms related tocystocele and rectocele. Problem started 2 years ago. Symptoms include: prolapse of tissue with straining, urinary incontinence: mild and discomfort: mild. Symptoms have gradually worsened.    Pt reports she has seen urology and GI in recent past w no suggestions to help.  She describes mostly urinary frequency (every 45 min to 2 hrs, most days) and nocturia some nights; she has LOU w urgency, and in large amounts; she feels bladder pressure in her vagina; also reports incomplete emptying and LOU w stressful activities.  She is not currently sexually active (>4 yrs, still married but mutually accepted).  Has h/o IBS, Diverticulitis, multiple back surgeries, Prior BSO for cysts, prior Hyst.  PMHx: She  has a past medical history of Allergic rhinitis, Arthritis, Asthma, Cancer ([place]), Cancer of nasal cavity and sinus ([place]) (07/07/2015), Cervical spondylosis, Colitis, Colon polyps, Depression, Diverticulosis, GERD (gastroesophageal reflux disease), Glaucoma, Headaches, cluster, Hyperlipidemia, IBS (irritable bowel syndrome), Kidney stones, Neuropathy, PONV (postoperative nausea and vomiting), and Tobacco abuse. Also,  has a past surgical history that includes Appendectomy (1998); Abdominal hysterectomy (1998); Lumbar spine surgery (07/2004); Vaginal delivery; ARM FX REPAIR (2001); Cervical fusion (2010); Cholecystectomy (08/2011); Esophagogastroduodenoscopy (2012); Colonoscopy; Tonsillectomy (1968); Cystoscopy; Back surgery; Dilation and curettage of uterus; Fracture surgery (2001); Image guided sinus surgery (N/A, 06/25/2015); Polypectomy (N/A, 06/25/2015); and Oophorectomy., family history includes Breast cancer (age of onset: 22) in her paternal grandmother; Cancer in her father and paternal grandmother; Colon cancer in her father; Coronary artery disease in her paternal grandmother; Diabetes in her mother and  paternal grandmother; Heart failure in her paternal grandmother; Hyperlipidemia in her mother; Hypertension in her mother; Pancreatic cancer in her father; Stroke in her brother.,  reports that she has been smoking cigarettes. She has a 9.75 pack-year smoking history. She has never used smokeless tobacco. She reports that she does not drink alcohol or use drugs.  She has a current medication list which includes the following prescription(s): aspirin ec, cetirizine, colestipol, cyclobenzaprine, epinephrine, loperamide, mirabegron er, multiple vitamins-minerals, pantoprazole, and ventolin hfa. Also, is allergic to albumin (human); amoxicillin-pot clavulanate; gabapentin; latex; betadine [povidone iodine]; sucralfate; amoxicillin; chantix  [varenicline tartrate]; chantix [varenicline tartrate]; erythromycin; and sucralfate.  Review of Systems  Constitutional: Positive for malaise/fatigue. Negative for chills and fever.  HENT: Negative for congestion, sinus pain and sore throat.   Eyes: Negative for blurred vision and pain.  Respiratory: Negative for cough and wheezing.   Cardiovascular: Negative for chest pain and leg swelling.  Gastrointestinal: Positive for abdominal pain. Negative for constipation, diarrhea, heartburn, nausea and vomiting.  Genitourinary: Negative for dysuria, frequency, hematuria and urgency.  Musculoskeletal: Negative for back pain, joint pain, myalgias and neck pain.  Skin: Negative for itching and rash.  Neurological: Negative for dizziness, tremors and weakness.  Endo/Heme/Allergies: Does not bruise/bleed easily.  Psychiatric/Behavioral: Negative for depression. The patient is not nervous/anxious and does not have insomnia.     Objective: BP 120/80, P80, R20, Weight 177 lbs Physical Exam Constitutional:      General: She is not in acute distress.    Appearance: She is well-developed.  Genitourinary:     Pelvic exam was performed with patient supine.     Urethra,  bladder and vagina normal.     No vaginal erythema or bleeding.     Genitourinary Comments: Cuff intact/ no lesions Mild atrophy Rectocele Gr2 Cystocele Gr 1 and distal mostly Min vag prolpase Absent uterus  and cervix  HENT:     Head: Normocephalic and atraumatic.     Nose: Nose normal.  Abdominal:     General: There is no distension.     Palpations: Abdomen is soft.     Tenderness: There is no abdominal tenderness.  Musculoskeletal:        General: Normal range of motion.  Neurological:     Mental Status: She is alert and oriented to person, place, and time.     Cranial Nerves: No cranial nerve deficit.  Skin:    General: Skin is warm and dry.  Psychiatric:        Attention and Perception: Attention normal.        Mood and Affect: Mood normal.        Speech: Speech normal.        Behavior: Behavior normal.        Cognition and Memory: Cognition normal.        Judgment: Judgment normal.     ASSESSMENT/PLAN:  New problem with work up and treatment planning  Problem List Items Addressed This Visit      Other   Mixed urge and stress incontinence   Pelvic floor weakness in female - Primary    Options discussed, including pessary, surgery, medicine, PT, nothing. Will start w Myrbetriq for OAB as this seems predominant component of her mixed picture. Consideration for pessary fitting and therapy F/u 2 mos to see how medicine helps  Barnett Applebaum, MD, [facility], [facility] 03/21/2020  5:24 PM

## 2020-03-21 NOTE — Telephone Encounter (Signed)
Will change to alternative medicine, as discussed

## 2020-03-21 NOTE — Telephone Encounter (Signed)
Patient is calling with a prescriptions problems. The medication she was prescribed to today is going to cost her over $100.00 and would like something else sent in. Please advise

## 2020-03-22 NOTE — Telephone Encounter (Signed)
Pt aware.

## 2020-03-26 ENCOUNTER — Other Ambulatory Visit: Payer: Self-pay | Admitting: Obstetrics & Gynecology

## 2020-03-26 MED ORDER — FESOTERODINE FUMARATE ER 4 MG PO TB24: 4.0000 mg | ORAL_TABLET | Freq: Every day | ORAL | 11 refills | Status: DC

## 2020-03-26 NOTE — Telephone Encounter (Signed)
Pt aware.

## 2020-03-26 NOTE — Telephone Encounter (Signed)
Rx Toviaz sent to [facility]

## 2020-03-26 NOTE — Telephone Encounter (Signed)
Patient is following up about her prescription that was supposed to be sent in. Patient reports that prescription wasn't sent in. Please advise

## 2020-04-03 DIAGNOSIS — Z8522 Personal history of malignant neoplasm of nasal cavities, middle ear, and accessory sinuses: Secondary | ICD-10-CM | POA: Diagnosis not present

## 2020-04-03 DIAGNOSIS — J340 Abscess, furuncle and carbuncle of nose: Secondary | ICD-10-CM | POA: Diagnosis not present

## 2020-04-03 DIAGNOSIS — J309 Allergic rhinitis, unspecified: Secondary | ICD-10-CM | POA: Diagnosis not present

## 2020-04-05 DIAGNOSIS — J301 Allergic rhinitis due to pollen: Secondary | ICD-10-CM | POA: Diagnosis not present

## 2020-04-17 DIAGNOSIS — J301 Allergic rhinitis due to pollen: Secondary | ICD-10-CM | POA: Diagnosis not present

## 2020-04-19 ENCOUNTER — Telehealth: Payer: Self-pay | Admitting: *Deleted

## 2020-04-19 NOTE — Telephone Encounter (Signed)
Patient was notified that her Low Dose Lung Cancer Screening Ct is due. She has declined having the CT at this time.

## 2020-04-23 ENCOUNTER — Telehealth: Payer: Self-pay | Admitting: *Deleted

## 2020-04-23 NOTE — Telephone Encounter (Signed)
(  04/23/2020) Pt has been notified that lung cancer screening CT scan is due currently or will be in near future. Confirmed pt is within appropriate age range, and asymptomatic. Pt denies illness that would prevent curative treatment for lung cancer if found. Verified smoking history (Current Smoker, 0.25 ppd ).  Pt is agreeable for CT scan being scheduled, but wants to see her oncologist (Dr. Grayland Ormond) first. Delma Post

## 2020-04-24 DIAGNOSIS — J301 Allergic rhinitis due to pollen: Secondary | ICD-10-CM | POA: Diagnosis not present

## 2020-04-26 DIAGNOSIS — Z01812 Encounter for preprocedural laboratory examination: Secondary | ICD-10-CM | POA: Diagnosis not present

## 2020-05-02 DIAGNOSIS — K644 Residual hemorrhoidal skin tags: Secondary | ICD-10-CM | POA: Diagnosis not present

## 2020-05-02 DIAGNOSIS — K5730 Diverticulosis of large intestine without perforation or abscess without bleeding: Secondary | ICD-10-CM | POA: Diagnosis not present

## 2020-05-02 DIAGNOSIS — K621 Rectal polyp: Secondary | ICD-10-CM | POA: Diagnosis not present

## 2020-05-02 DIAGNOSIS — K2900 Acute gastritis without bleeding: Secondary | ICD-10-CM | POA: Diagnosis not present

## 2020-05-02 DIAGNOSIS — K52839 Microscopic colitis, unspecified: Secondary | ICD-10-CM | POA: Diagnosis not present

## 2020-05-02 DIAGNOSIS — K2950 Unspecified chronic gastritis without bleeding: Secondary | ICD-10-CM | POA: Diagnosis not present

## 2020-05-02 DIAGNOSIS — K219 Gastro-esophageal reflux disease without esophagitis: Secondary | ICD-10-CM | POA: Diagnosis not present

## 2020-05-02 DIAGNOSIS — K2970 Gastritis, unspecified, without bleeding: Secondary | ICD-10-CM | POA: Diagnosis not present

## 2020-05-02 DIAGNOSIS — R197 Diarrhea, unspecified: Secondary | ICD-10-CM | POA: Diagnosis not present

## 2020-05-13 ENCOUNTER — Ambulatory Visit
Admission: RE | Admit: 2020-05-13 | Discharge: 2020-05-13 | Disposition: A | Discharge status: Home / Self Care | Payer: Medicare PPO | Source: Ambulatory Visit | Attending: Internal Medicine | Admitting: Internal Medicine

## 2020-05-13 DIAGNOSIS — Z1231 Encounter for screening mammogram for malignant neoplasm of breast: Secondary | ICD-10-CM | POA: Insufficient documentation

## 2020-05-15 DIAGNOSIS — J301 Allergic rhinitis due to pollen: Secondary | ICD-10-CM | POA: Diagnosis not present

## 2020-05-21 ENCOUNTER — Ambulatory Visit (INDEPENDENT_AMBULATORY_CARE_PROVIDER_SITE_OTHER): Payer: Medicare PPO | Admitting: Obstetrics & Gynecology

## 2020-05-21 ENCOUNTER — Other Ambulatory Visit: Payer: Self-pay

## 2020-05-21 ENCOUNTER — Encounter: Payer: Self-pay | Admitting: Obstetrics & Gynecology

## 2020-05-21 VITALS — BP 114/70 | Ht 65.0 in | Wt 180.0 lb

## 2020-05-21 DIAGNOSIS — N3946 Mixed incontinence: Secondary | ICD-10-CM | POA: Diagnosis not present

## 2020-05-21 DIAGNOSIS — N8189 Other female genital prolapse: Secondary | ICD-10-CM

## 2020-05-21 MED ORDER — FESOTERODINE FUMARATE ER 4 MG PO TB24: 4.0000 mg | ORAL_TABLET | Freq: Every day | ORAL | 3 refills | Status: DC

## 2020-05-21 NOTE — Progress Notes (Signed)
History of Present Illness:  Heather Beasley is a 64 y.o. who was started on TOVIAZ (Myrbetriq too expensive)  approximately 2 months ago. Since that time, she states that her symptoms are improving.  She reports much improved freq and nocturia;  Still has some GSI but less often and less amount of leakage volume.  No worsening of vag pressure.    PMHx: She  has a past medical history of Allergic rhinitis, Arthritis, Asthma, Cancer ([place]), Cancer of nasal cavity and sinus ([place]) (07/07/2015), Cervical spondylosis, Colitis, Colon polyps, Depression, Diverticulosis, GERD (gastroesophageal reflux disease), Glaucoma, Headaches, cluster, Hyperlipidemia, IBS (irritable bowel syndrome), Kidney stones, Neuropathy, PONV (postoperative nausea and vomiting), and Tobacco abuse. Also,  has a past surgical history that includes Appendectomy (1998); Abdominal hysterectomy (1998); Lumbar spine surgery (07/2004); Vaginal delivery; ARM FX REPAIR (2001); Cervical fusion (2010); Cholecystectomy (08/2011); Esophagogastroduodenoscopy (2012); Colonoscopy; Tonsillectomy (1968); Cystoscopy; Back surgery; Dilation and curettage of uterus; Fracture surgery (2001); Image guided sinus surgery (N/A, 06/25/2015); Polypectomy (N/A, 06/25/2015); and Oophorectomy., family history includes Breast cancer (age of onset: 64) in her paternal grandmother; Cancer in her father and paternal grandmother; Colon cancer in her father; Coronary artery disease in her paternal grandmother; Diabetes in her mother and paternal grandmother; Heart failure in her paternal grandmother; Hyperlipidemia in her mother; Hypertension in her mother; Pancreatic cancer in her father; Stroke in her brother.,  reports that she has been smoking cigarettes. She has a 9.75 pack-year smoking history. She has never used smokeless tobacco. She reports that she does not drink alcohol and does not use drugs. Current Meds  Medication Sig  . aspirin EC 81 MG tablet Take 81 mg by mouth  daily.  . cetirizine (ZYRTEC) 10 MG tablet Take 10 mg by mouth daily. Allergies.  . colestipol (COLESTID) 1 g tablet Take by mouth.  . cyclobenzaprine (FLEXERIL) 10 MG tablet Take 1 tablet (10 mg total) by mouth 3 (three) times daily.  Marland Kitchen EPINEPHrine 0.3 mg/0.3 mL IJ SOAJ injection Inject 1 mg into the skin as needed.  . fesoterodine (TOVIAZ) 4 MG TB24 tablet Take 1 tablet (4 mg total) by mouth daily.  Marland Kitchen loperamide (IMODIUM A-D) 2 MG tablet Take 2 mg by mouth 2 (two) times a day.   . Multiple Vitamins-Minerals (CENTRUM ADULTS PO) Take 1 tablet by mouth.  . pantoprazole (PROTONIX) 40 MG tablet Take 1 tablet (40 mg total) by mouth 2 (two) times daily.  Marland Kitchen tolterodine (DETROL LA) 2 MG 24 hr capsule   . VENTOLIN HFA 108 (90 Base) MCG/ACT inhaler INHALE 2 PUFFS INTO THE LUNGS EVERY 6 HOURS AS NEEDED FOR WHEEZING ORSHORTNESS OF BREATH  . Also, is allergic to albumin (human), amoxicillin-pot clavulanate, gabapentin, latex, betadine [povidone iodine], sucralfate, amoxicillin, chantix  [varenicline tartrate], chantix [varenicline tartrate], erythromycin, and sucralfate..  Review of Systems  All other systems reviewed and are negative.   Physical Exam:  BP 114/70   Ht 5\' 5"  (1.651 m)   Wt 180 lb (81.6 kg)   BMI 29.95 kg/m  Body mass index is 29.95 kg/m. Constitutional: Well nourished, well developed female in no acute distress.  Abdomen: diffusely non tender to palpation, non distended, and no masses, hernias Neuro: Grossly intact Psych:  Normal mood and affect.    Assessment:  Problem List Items Addressed This Visit      Other   Mixed urge and stress incontinence   Pelvic floor weakness in female - Primary     Medication treatment is going well for  her OAB, mixed urinary incontinence.  Plan: She will undergo no change in her medical therapy.  She was amenable to this plan and we will see her back for annual/PRN.  A total of 20 minutes were spent face-to-face with the patient as well  as preparation, review, communication, and documentation during this encounter.   Barnett Applebaum, MD, [facility], [facility] 05/21/2020  9:52 AM

## 2020-05-21 NOTE — Patient Instructions (Signed)
Thank you for choosing [facility]. As part of our ongoing efforts to improve patient experience, we would appreciate your feedback. Please fill out the short survey that you will receive by mail or MyChart. Your opinion is important to us! -Dr Zylee Marchiano  

## 2020-05-22 DIAGNOSIS — J301 Allergic rhinitis due to pollen: Secondary | ICD-10-CM | POA: Diagnosis not present

## 2020-05-23 ENCOUNTER — Other Ambulatory Visit: Payer: Self-pay

## 2020-05-23 ENCOUNTER — Ambulatory Visit
Admission: RE | Admit: 2020-05-23 | Discharge: 2020-05-23 | Disposition: A | Discharge status: Home / Self Care | Payer: Medicare PPO | Source: Ambulatory Visit | Attending: Oncology | Admitting: Oncology

## 2020-05-23 DIAGNOSIS — C300 Malignant neoplasm of nasal cavity: Secondary | ICD-10-CM | POA: Insufficient documentation

## 2020-05-23 DIAGNOSIS — C319 Malignant neoplasm of accessory sinus, unspecified: Secondary | ICD-10-CM | POA: Insufficient documentation

## 2020-05-23 DIAGNOSIS — I6522 Occlusion and stenosis of left carotid artery: Secondary | ICD-10-CM | POA: Diagnosis not present

## 2020-05-23 DIAGNOSIS — J3489 Other specified disorders of nose and nasal sinuses: Secondary | ICD-10-CM | POA: Diagnosis not present

## 2020-05-23 DIAGNOSIS — M7989 Other specified soft tissue disorders: Secondary | ICD-10-CM | POA: Diagnosis not present

## 2020-05-23 DIAGNOSIS — Z8522 Personal history of malignant neoplasm of nasal cavities, middle ear, and accessory sinuses: Secondary | ICD-10-CM | POA: Diagnosis not present

## 2020-05-23 HISTORY — DX: Malignant melanoma of skin, unspecified: C43.9

## 2020-05-23 LAB — POCT I-STAT CREATININE: Creatinine, Ser: 1.1 mg/dL — ABNORMAL HIGH (ref 0.44–1.00)

## 2020-05-23 MED ORDER — IOHEXOL 300 MG/ML  SOLN
75.0000 mL | Freq: Once | INTRAMUSCULAR | Status: AC | PRN
  Administered 2020-05-23: 75 mL via INTRAVENOUS

## 2020-05-24 ENCOUNTER — Ambulatory Visit: Payer: Medicare PPO | Admitting: Radiation Oncology

## 2020-05-26 NOTE — Progress Notes (Signed)
[place]  [phone] [phone]  ID: Heather Beasley OB: [birth year]  MR#: [id number]  ENI#:[id number]  Patient Care Team: Jearld Fenton, NP as PCP - General (Internal Medicine) Earnie Larsson, MD (Neurosurgery) Leeroy Cha, MD (Neurosurgery) Noreene Filbert, MD as Referring Physician (Radiation Oncology) Beverly Gust, MD (Otolaryngology)  CHIEF COMPLAINT: Stage I right nasal cavity adenocarcinoma.  INTERVAL HISTORY: Patient returns to clinic today for further evaluation and discussion of her imaging results.  She currently feels well and is asymptomatic. She has no neurologic complaints.  She denies any recent fevers or illnesses.  She denies any chest pain, shortness of breath, cough, or hemoptysis.  She denies any nausea, vomiting, constipation, or diarrhea.  She has no urinary complaints.  Patient feels at her baseline offers no specific complaints today.  REVIEW OF SYSTEMS:   Review of Systems  Constitutional: Negative.  Negative for fever, malaise/fatigue and weight loss.  Respiratory: Negative.  Negative for cough, hemoptysis and shortness of breath.   Cardiovascular: Negative.  Negative for chest pain and leg swelling.  Gastrointestinal: Negative.  Negative for abdominal pain.  Genitourinary: Negative.  Negative for dysuria.  Musculoskeletal: Negative.  Negative for back pain.  Skin: Negative.  Negative for rash.  Neurological: Negative.  Negative for dizziness, focal weakness, weakness and headaches.  Psychiatric/Behavioral: Negative.  The patient is not nervous/anxious.     As per HPI. Otherwise, a complete review of systems is negative.  PAST MEDICAL HISTORY: Past Medical History:  Diagnosis Date  . Allergic rhinitis   . Arthritis   . Asthma   . Cancer of nasal cavity and sinus ([place]) 07/07/2015   radiation  . Cervical spondylosis   . Colitis   . Colon polyps   . Depression   . Diverticulosis   . GERD  (gastroesophageal reflux disease)   . Glaucoma   . Headaches, cluster   . Hyperlipidemia   . IBS (irritable bowel syndrome)   . Kidney stones   . Melanoma ([place])   . Neuropathy   . PONV (postoperative nausea and vomiting)   . Tobacco abuse     PAST SURGICAL HISTORY: Past Surgical History:  Procedure Laterality Date  . ABDOMINAL HYSTERECTOMY  1998  . APPENDECTOMY  1998  . ARM FX REPAIR  2001  . BACK SURGERY    . CERVICAL FUSION  2010  . CHOLECYSTECTOMY  08/2011   Dr Rochel Brome  . COLONOSCOPY     Dr Tiffany Kocher  . CYSTOSCOPY     Dr. Bernardo Heater  . DILATION AND CURETTAGE OF UTERUS    . ESOPHAGOGASTRODUODENOSCOPY  2012   Dr Tiffany Kocher  . FRACTURE SURGERY  2001   plate in right wrist and arm  . IMAGE GUIDED SINUS SURGERY N/A 06/25/2015   Procedure: IMAGE GUIDED SINUS SURGERY;  Surgeon: Beverly Gust, MD;  Location: [facility] ORS;  Service: ENT;  Laterality: N/A;  . LUMBAR SPINE SURGERY  07/2004  . OOPHORECTOMY    . POLYPECTOMY N/A 06/25/2015   Procedure: POLYPECTOMY NASAL;  Surgeon: Beverly Gust, MD;  Location: [facility] ORS;  Service: ENT;  Laterality: N/A;  . [place]  . VAGINAL DELIVERY     x2    FAMILY HISTORY: Family History  Problem Relation Age of Onset  . Hypertension Mother   . Hyperlipidemia Mother   . Diabetes Mother   . Colon cancer Father   . Pancreatic cancer Father   . Cancer Father        Colon &  Pancreatic  . Breast cancer Paternal Grandmother 20  . Diabetes Paternal Grandmother   . Coronary artery disease Paternal Grandmother   . Heart failure Paternal Grandmother   . Cancer Paternal Grandmother        breast  . Stroke Brother     ADVANCED DIRECTIVES (Y/N):  N  HEALTH MAINTENANCE: Social History   Tobacco Use  . Smoking status: Current Every Day Smoker    Packs/day: 0.25    Years: 39.00    Pack years: 9.75    Types: Cigarettes  . Smokeless tobacco: Never Used  . Tobacco comment: e cigs currently--trying to quit  Substance Use Topics  .  Alcohol use: No  . Drug use: No     Colonoscopy:  PAP:  Bone density:  Lipid panel:  Allergies  Allergen Reactions  . Albumin (Human) Rash  . Amoxicillin-Pot Clavulanate Hives  . Gabapentin Shortness Of Breath  . Latex Dermatitis and Rash  . Betadine [Povidone Iodine] Dermatitis, Rash and Other (See Comments)    Skin burning Topical betadine and iodine have this reaction with patient.  IV contrast is NOT a problem.  jkl  . Sucralfate     Other reaction(s): Other (See Comments) Abdominal Pain  . Amoxicillin     REACTION: rash  . Chantix  [Varenicline Tartrate]   . Chantix [Varenicline Tartrate]     Heart racing  . Erythromycin     REACTION: rash  . Sucralfate Nausea And Vomiting    Heart racing Lightheaded     Current Outpatient Medications  Medication Sig Dispense Refill  . aspirin EC 81 MG tablet Take 81 mg by mouth daily.    . cetirizine (ZYRTEC) 10 MG tablet Take 10 mg by mouth daily. Allergies.    . colestipol (COLESTID) 1 g tablet Take by mouth.    . cyclobenzaprine (FLEXERIL) 10 MG tablet Take 1 tablet (10 mg total) by mouth 3 (three) times daily. 90 tablet 6  . EPINEPHrine 0.3 mg/0.3 mL IJ SOAJ injection Inject 1 mg into the skin as needed.    . fesoterodine (TOVIAZ) 4 MG TB24 tablet Take 1 tablet (4 mg total) by mouth daily. 90 tablet 3  . loperamide (IMODIUM A-D) 2 MG tablet Take 2 mg by mouth 2 (two) times a day.     . Multiple Vitamins-Minerals (CENTRUM ADULTS PO) Take 1 tablet by mouth.    . pantoprazole (PROTONIX) 40 MG tablet Take 1 tablet (40 mg total) by mouth 2 (two) times daily. 60 tablet 11  . VENTOLIN HFA 108 (90 Base) MCG/ACT inhaler INHALE 2 PUFFS INTO THE LUNGS EVERY 6 HOURS AS NEEDED FOR WHEEZING ORSHORTNESS OF BREATH 18 g 0   No current facility-administered medications for this visit.    OBJECTIVE: There were no vitals filed for this visit.   There is no height or weight on file to calculate BMI.    ECOG FS:0 - Asymptomatic  General:  Well-developed, well-nourished, no acute distress. Eyes: Pink conjunctiva, anicteric sclera. HEENT: Normocephalic, moist mucous membranes.  Clear oropharynx, no palpable lymphadenopathy. Lungs: No audible wheezing or coughing. Heart: Regular rate and rhythm. Abdomen: Soft, nontender, no obvious distention. Musculoskeletal: No edema, cyanosis, or clubbing. Neuro: Alert, answering all questions appropriately. Cranial nerves grossly intact. Skin: No rashes or petechiae noted. Psych: Normal affect.   LAB RESULTS:  Lab Results  Component Value Date   NA 140 05/25/2019   K 4.9 05/25/2019   CL 105 05/25/2019   CO2 27 05/25/2019  GLUCOSE 107 (H) 05/25/2019   BUN 12 05/25/2019   CREATININE 1.10 (H) 05/23/2020   CALCIUM 9.3 05/25/2019   PROT 7.0 05/25/2019   ALBUMIN 3.9 05/25/2019   AST 21 05/25/2019   ALT 15 05/25/2019   ALKPHOS 98 05/25/2019   BILITOT 0.8 05/25/2019   GFRNONAA >60 05/25/2019   GFRAA >60 05/25/2019    Lab Results  Component Value Date   WBC 4.6 05/25/2019   NEUTROABS 2.9 05/25/2019   HGB 14.0 05/25/2019   HCT 43.8 05/25/2019   MCV 94.0 05/25/2019   PLT 216 05/25/2019     STUDIES: CT MAXILLOFACIAL W CONTRAST  Result Date: 05/23/2020 CLINICAL DATA:  64 year old female with history of treated adenocarcinoma of the right posterior nasal cavity, stage I and completed adjuvant XRT in 2016. Restaging. EXAM: CT MAXILLOFACIAL WITH CONTRAST TECHNIQUE: Multidetector CT imaging of the maxillofacial structures was performed with intravenous contrast. Multiplanar CT image reconstructions were also generated. CONTRAST:  5mL OMNIPAQUE IOHEXOL 300 MG/ML  SOLN COMPARISON:  Face CT with contrast 05/22/2019 and earlier. FINDINGS: Osseous: Absent dentition. Partially visible previous cervical ACDF. Intact skull base. No acute or suspicious osseous lesion identified. Orbits: Intact orbital walls. Orbits soft tissues appear symmetric, stable, and normal. Sinuses: A right posterior  nasal cavity 2.9 cm polypoid mass was demonstrated on pretreatment MRI 06/18/2015. The nasal septum remains intact and midline. Somewhat atrophic appearing bilateral nasal cavity mucosa is redemonstrated and stable since last year. Small volume retained secretions on the left. Soft tissue in the posterior nasal cavity along the posterior turbinate remains stable (series 8, image 35 today) when compared to 2019. No suspicious findings in the nasal cavity. Trace retained secretions in the midline nasopharynx. Paranasal sinuses remain clear. Tympanic cavities and mastoids remain clear. Soft tissues: Stable and negative visible pharynx, larynx, parapharyngeal spaces, retropharyngeal space, sublingual space, submandibular spaces, masticator and parotid spaces. No upper cervical lymphadenopathy. The major vascular structures in the neck and at the skull base appear patent. There is chronic carotid atherosclerosis, and suggestion today of hemodynamically significant stenosis of the distal left ICA bulb (series 2, image 68) which appears progressed since 2019. Limited intracranial: Stable and negative visible brain parenchyma. IMPRESSION: 1. Stable and satisfactory post treatment appearance of the right nasal cavity. 2. Suspect progressed and now hemodynamically significant stenosis of the cervical Left ICA at the distal bulb. Consider follow-up Carotid Doppler Ultrasound, or alternatively Neck CTA with contrast. Electronically Signed   By: Genevie Ann M.D.   On: 05/23/2020 20:55   MM 3D SCREEN BREAST BILATERAL  Result Date: 05/13/2020 CLINICAL DATA:  Screening. EXAM: DIGITAL SCREENING BILATERAL MAMMOGRAM WITH TOMO AND CAD COMPARISON:  Previous exam(s). ACR Breast Density Category b: There are scattered areas of fibroglandular density. FINDINGS: There are no findings suspicious for malignancy. Images were processed with CAD. IMPRESSION: No mammographic evidence of malignancy. A result letter of this screening mammogram will  be mailed directly to the patient. RECOMMENDATION: Screening mammogram in one year. (Code:[id number]-B-01Y) BI-RADS CATEGORY  1: Negative. Electronically Signed   By: Abelardo Diesel M.D.   On: 05/13/2020 14:14    ASSESSMENT: Stage I right nasal cavity adenocarcinoma.  PLAN:    1. Stage I right nasal cavity adenocarcinoma: Patient completed adjuvant XRT in 2016.  She did not require chemotherapy.  Her most recent imaging on May 23, 2020 reviewed independently and reported as above with no obvious evidence of recurrent or progressive disease.  No intervention is needed.  After lengthy discussion with the patient,  no further follow-up is necessary.  Continue follow-up with ENT as scheduled.  Please refer patient back if there are any questions or concerns. 2.  Lung cancer screening: Patient continues to actively smoke.  Continue follow-up yearly low-dose CT scan of the chest. 3.  Carotid artery stenosis: Seen on CT scan.  Patient is currently asymptomatic.  Have forwarded imaging to patient's primary care physician for consideration of vascular surgery referral if needed.  Patient expressed understanding and was in agreement with this plan. She also understands that She can call clinic at any time with any questions, concerns, or complaints.   Cancer Staging Cancer of nasal cavity and sinus ([place]) Staging form: Nasal Cavity and Ethmoid Sinus, AJCC 7th Edition - Clinical: Stage I (T1, N0, M0) - Unsigned   Lloyd Huger, MD   05/29/2020 1:17 PM

## 2020-05-28 ENCOUNTER — Other Ambulatory Visit: Payer: Self-pay

## 2020-05-28 ENCOUNTER — Encounter: Payer: Self-pay | Admitting: Radiation Oncology

## 2020-05-28 ENCOUNTER — Ambulatory Visit
Admission: RE | Admit: 2020-05-28 | Discharge: 2020-05-28 | Disposition: A | Discharge status: Home / Self Care | Payer: Medicare PPO | Source: Ambulatory Visit | Attending: Radiation Oncology | Admitting: Radiation Oncology

## 2020-05-28 ENCOUNTER — Inpatient Hospital Stay: Payer: Medicare PPO | Attending: Oncology | Admitting: Oncology

## 2020-05-28 VITALS — BP 133/85 | HR 79 | Temp 95.9°F | Wt 181.0 lb

## 2020-05-28 DIAGNOSIS — E785 Hyperlipidemia, unspecified: Secondary | ICD-10-CM | POA: Diagnosis not present

## 2020-05-28 DIAGNOSIS — C300 Malignant neoplasm of nasal cavity: Secondary | ICD-10-CM

## 2020-05-28 DIAGNOSIS — C760 Malignant neoplasm of head, face and neck: Secondary | ICD-10-CM

## 2020-05-28 DIAGNOSIS — Z7982 Long term (current) use of aspirin: Secondary | ICD-10-CM | POA: Insufficient documentation

## 2020-05-28 DIAGNOSIS — Z90710 Acquired absence of both cervix and uterus: Secondary | ICD-10-CM | POA: Diagnosis not present

## 2020-05-28 DIAGNOSIS — K589 Irritable bowel syndrome without diarrhea: Secondary | ICD-10-CM | POA: Insufficient documentation

## 2020-05-28 DIAGNOSIS — J45909 Unspecified asthma, uncomplicated: Secondary | ICD-10-CM | POA: Insufficient documentation

## 2020-05-28 DIAGNOSIS — Z923 Personal history of irradiation: Secondary | ICD-10-CM | POA: Diagnosis not present

## 2020-05-28 DIAGNOSIS — F17210 Nicotine dependence, cigarettes, uncomplicated: Secondary | ICD-10-CM | POA: Insufficient documentation

## 2020-05-28 DIAGNOSIS — Z90721 Acquired absence of ovaries, unilateral: Secondary | ICD-10-CM | POA: Diagnosis not present

## 2020-05-28 DIAGNOSIS — Z803 Family history of malignant neoplasm of breast: Secondary | ICD-10-CM | POA: Insufficient documentation

## 2020-05-28 DIAGNOSIS — Z833 Family history of diabetes mellitus: Secondary | ICD-10-CM | POA: Insufficient documentation

## 2020-05-28 DIAGNOSIS — Z8349 Family history of other endocrine, nutritional and metabolic diseases: Secondary | ICD-10-CM | POA: Insufficient documentation

## 2020-05-28 DIAGNOSIS — Z8522 Personal history of malignant neoplasm of nasal cavities, middle ear, and accessory sinuses: Secondary | ICD-10-CM | POA: Insufficient documentation

## 2020-05-28 DIAGNOSIS — Z8249 Family history of ischemic heart disease and other diseases of the circulatory system: Secondary | ICD-10-CM | POA: Insufficient documentation

## 2020-05-28 DIAGNOSIS — K219 Gastro-esophageal reflux disease without esophagitis: Secondary | ICD-10-CM | POA: Diagnosis not present

## 2020-05-28 DIAGNOSIS — Z800 Family history of malignant neoplasm of digestive organs: Secondary | ICD-10-CM | POA: Diagnosis not present

## 2020-05-28 DIAGNOSIS — Z85820 Personal history of malignant melanoma of skin: Secondary | ICD-10-CM | POA: Diagnosis not present

## 2020-05-28 DIAGNOSIS — C319 Malignant neoplasm of accessory sinus, unspecified: Secondary | ICD-10-CM | POA: Diagnosis not present

## 2020-05-28 DIAGNOSIS — Z9221 Personal history of antineoplastic chemotherapy: Secondary | ICD-10-CM | POA: Diagnosis not present

## 2020-05-28 DIAGNOSIS — Z79899 Other long term (current) drug therapy: Secondary | ICD-10-CM | POA: Insufficient documentation

## 2020-05-28 NOTE — Progress Notes (Signed)
Radiation Oncology Follow up Note  Name: Heather Beasley   Date:   05/28/2020 MRN:  [id number] DOB: 03-Oct-1956    This 64 y.o. 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  She specifically denies any's head and neck stuffiness head and neck pain or dysphagia.  She is being followed by Dr. Tami Ribas and exams have shown no evidence of disease..  She had a recent CT scan which I have reviewed shows stable and satisfactory posttreatment appearance of the right nasal cavity.  She has a now hemodynamically significant stenosis of the cervical left ICA at the distal bulb which will be followed with possible carotid Doppler.  She also had a mammogram which was BI-RADS 2.  COMPLICATIONS OF TREATMENT: none  FOLLOW UP COMPLIANCE: keeps appointments   PHYSICAL EXAM:  BP 133/85 (BP Location: Left Arm, Patient Position: Sitting, Cuff Size: Normal)   Pulse 79   Temp (!) 95.9 F (35.5 C) (Tympanic)   Wt 181 lb (82.1 kg)   BMI 30.12 kg/m  Well-developed well-nourished patient in NAD. HEENT reveals PERLA, EOMI, discs not visualized.  Oral cavity is clear. No oral mucosal lesions are identified. Neck is clear without evidence of cervical or supraclavicular adenopathy. Lungs are clear to A&P. Cardiac examination is essentially unremarkable with regular rate and rhythm without murmur rub or thrill. Abdomen is benign with no organomegaly or masses noted. Motor sensory and DTR levels are equal and symmetric in the upper and lower extremities. Cranial nerves II through XII are grossly intact. Proprioception is intact. No peripheral adenopathy or edema  is identified. No motor or sensory levels are noted. Crude visual fields are within normal range.  RADIOLOGY RESULTS: CT scans reviewed compatible with above-stated findings  PLAN: Present time patient is 5 years with no evidence of disease.  I am pleased with her overall progress.  Now that she is out over 5 years I am going to discontinue follow-up care she continues close follow-up care with ENT.  Patient knows to call with any concerns at any time.  I would like to take this opportunity to thank you for allowing me to participate in the care of your patient.Noreene Filbert, MD

## 2020-05-30 DIAGNOSIS — J301 Allergic rhinitis due to pollen: Secondary | ICD-10-CM | POA: Diagnosis not present

## 2020-06-04 ENCOUNTER — Other Ambulatory Visit: Payer: Self-pay

## 2020-06-04 ENCOUNTER — Encounter: Payer: Self-pay | Admitting: Internal Medicine

## 2020-06-04 ENCOUNTER — Ambulatory Visit: Payer: Medicare PPO | Admitting: Internal Medicine

## 2020-06-04 VITALS — BP 128/80 | HR 78 | Temp 97.7°F | Wt 181.0 lb

## 2020-06-04 DIAGNOSIS — I6523 Occlusion and stenosis of bilateral carotid arteries: Secondary | ICD-10-CM

## 2020-06-04 DIAGNOSIS — E7800 Pure hypercholesterolemia, unspecified: Secondary | ICD-10-CM

## 2020-06-04 DIAGNOSIS — H9202 Otalgia, left ear: Secondary | ICD-10-CM

## 2020-06-04 DIAGNOSIS — B372 Candidiasis of skin and nail: Secondary | ICD-10-CM | POA: Diagnosis not present

## 2020-06-04 MED ORDER — NYSTATIN 100000 UNIT/GM EX POWD: 1.0000 "application " | Freq: Three times a day (TID) | CUTANEOUS | 0 refills | Status: DC

## 2020-06-04 NOTE — Patient Instructions (Signed)
Carotid Artery Disease  Carotid artery disease is the narrowing or blockage of one or both carotid arteries. This condition is also called carotid artery stenosis. The carotid arteries are the two main blood vessels on either side of the neck. They send blood to the brain, other parts of the head, and the neck.  This condition increases your risk for a stroke or a transient ischemic attack (TIA). A TIA is a "mini-stroke" that causes stroke-like symptoms that go away quickly. What are the causes? This condition is mainly caused by a narrowing and hardening of the carotid arteries. The carotid arteries can become narrow or clogged with a buildup of plaque. Plaque includes:  Fat.  Cholesterol.  Calcium.  Other substances. What increases the risk? The following factors may make you more likely to develop this condition:  Having certain medical conditions, such as: ? High cholesterol. ? High blood pressure. ? Diabetes. ? Obesity.  Smoking.  A family history of cardiovascular disease.  Not being active or lack of regular exercise.  Being female. Men have a higher risk of having arteries become narrow and harden earlier in life than women.  Old age. What are the signs or symptoms? This condition may not have any signs or symptoms until a stroke or TIA happens. In some cases, your doctor may be able to hear a whooshing sound. This can suggest a change in blood flow caused by plaque buildup. An eye exam can also help find signs of the condition. How is this treated? This condition may be treated with more than one treatment. Treatment options include:  Lifestyle changes, such as: ? Quitting smoking. ? Getting regular exercise, or getting exercise as told by your doctor. ? Eating a healthy diet. ? Managing stress. ? Keeping a healthy weight.  Medicines to control: ? Blood pressure. ? Cholesterol. ? Blood clotting.  Surgery. You may have: ? A surgery to remove the blockages in  the carotid arteries. ? A procedure in which a small mesh tube (stent) is used to widen the blocked carotid arteries. Follow these instructions at home: Eating and drinking Follow instructions about your diet from your doctor. It is important to follow a healthy diet.  Eat a diet that includes: ? A lot of fresh fruits and vegetables. ? Low-fat (lean) meats.  Avoid these foods: ? Foods that are high in fat. ? Foods that are high in salt (sodium). ? Foods that are fried. ? Foods that are processed. ? Foods that have few good nutrients (poor nutritional value).  Lifestyle   Keep a healthy weight.  Do exercises as told by your doctor to stay active. Each week, you should get one of the following: ? At least 150 minutes of exercise that raises your heart rate and makes you sweat (moderate-intensity exercise). ? At least 75 minutes of exercise that takes a lot of effort.  Do not use any products that contain nicotine or tobacco, such as cigarettes, e-cigarettes, and chewing tobacco. If you need help quitting, ask your doctor.  Do not drink alcohol if: ? Your doctor tells you not to drink. ? You are pregnant, may be pregnant, or are planning to become pregnant.  If you drink alcohol: ? Limit how much you use to:  0-1 drink a day for women.  0-2 drinks a day for men. ? Be aware of how much alcohol is in your drink. In the [place] one drink equals one 12 oz bottle of beer (355 mL), one 5   oz glass of wine (148 mL), or one 1 oz glass of hard liquor (44 mL).  Do not use drugs.  Manage your stress. Ask your doctor for tips on how to do this. General instructions  Take over-the-counter and prescription medicines only as told by your doctor.  Keep all follow-up visits as told by your doctor. This is important. Where to find more information  American Heart Association: www.heart.org Get help right away if:  You have any signs of a stroke. "BE FAST" is an easy way to remember the  main warning signs: ? B - Balance. Signs are dizziness, sudden trouble walking, or loss of balance. ? E - Eyes. Signs are trouble seeing or a change in how you see. ? F - Face. Signs are sudden weakness or loss of feeling of the face, or the face or eyelid drooping on one side. ? A - Arms. Signs are weakness or loss of feeling in an arm. This happens suddenly and usually on one side of the body. ? S - Speech. Signs are sudden trouble speaking, slurred speech, or trouble understanding what people say. ? T - Time. Time to call emergency services. Write down what time symptoms started.  You have other signs of a stroke, such as: ? A sudden, very bad headache with no known cause. ? Feeling like you may vomit (nausea). ? Vomiting. ? A seizure. These symptoms may be an emergency. Do not wait to see if the symptoms will go away. Get medical help right away. Call your local emergency services (911 in the [place] Do not drive yourself to the hospital. Summary  The carotid arteries are blood vessels on both sides of the neck.  If these arteries get smaller or get blocked, you are more likely to have a stroke or a mini-stroke.  This condition can be treated with lifestyle changes, medicines, surgery, or a blend of these treatments.  Get help right away if you have any signs of a stroke. "BE FAST" is an easy way to remember the main warning signs of stroke. This information is not intended to replace advice given to you by your health care provider. Make sure you discuss any questions you have with your health care provider. Document Revised: 05/08/2019 Document Reviewed: 05/08/2019 Elsevier Patient Education  2020 [employer]  

## 2020-06-04 NOTE — Progress Notes (Signed)
Subjective:    Patient ID: Heather Beasley, female    DOB: 07-28-1956, 64 y.o.   MRN: [id number]  HPI  Patient presents the clinic today requesting referral to vascular surgery. She reports she was told by her ENT, that she needed further evaluation of carotid stenosis seen on her recent CT scan.   IMPRESSION: 1. Stable and satisfactory post treatment appearance of the right nasal cavity. 2. Suspect progressed and now hemodynamically significant stenosis of the cervical Left ICA at the distal bulb. Consider follow-up Carotid Doppler Ultrasound, or alternatively Neck CTA with contrast.  Her BP today is 128/80. She is currently smoking 1/2-3/4 ppd for 45 years. She has not had her lipid checked since 2019.  She also reports left ear discomfort. This started a few days ago. She describes the pain as sore and achy. She denies decreased hearing, drainage.  She also c/o a rash in her breast. She noticed this 3 weeks ago. The rash itches. It has not spread. She has tried diaper rash cream OTC with some relief.  Review of Systems      Past Medical History:  Diagnosis Date  . Allergic rhinitis   . Arthritis   . Asthma   . Cancer of nasal cavity and sinus ([place]) 07/07/2015   radiation  . Cervical spondylosis   . Colitis   . Colon polyps   . Depression   . Diverticulosis   . GERD (gastroesophageal reflux disease)   . Glaucoma   . Headaches, cluster   . Hyperlipidemia   . IBS (irritable bowel syndrome)   . Kidney stones   . Melanoma ([place])   . Neuropathy   . PONV (postoperative nausea and vomiting)   . Tobacco abuse     Current Outpatient Medications  Medication Sig Dispense Refill  . aspirin EC 81 MG tablet Take 81 mg by mouth daily.    . cetirizine (ZYRTEC) 10 MG tablet Take 10 mg by mouth daily. Allergies.    . colestipol (COLESTID) 1 g tablet Take by mouth.    . cyclobenzaprine (FLEXERIL) 10 MG tablet Take 1 tablet (10 mg total) by mouth 3 (three) times daily. 90 tablet 6   . EPINEPHrine 0.3 mg/0.3 mL IJ SOAJ injection Inject 1 mg into the skin as needed.    . fesoterodine (TOVIAZ) 4 MG TB24 tablet Take 1 tablet (4 mg total) by mouth daily. 90 tablet 3  . loperamide (IMODIUM A-D) 2 MG tablet Take 2 mg by mouth 2 (two) times a day.     . Multiple Vitamins-Minerals (CENTRUM ADULTS PO) Take 1 tablet by mouth.    . pantoprazole (PROTONIX) 40 MG tablet Take 1 tablet (40 mg total) by mouth 2 (two) times daily. 60 tablet 11  . VENTOLIN HFA 108 (90 Base) MCG/ACT inhaler INHALE 2 PUFFS INTO THE LUNGS EVERY 6 HOURS AS NEEDED FOR WHEEZING ORSHORTNESS OF BREATH 18 g 0   No current facility-administered medications for this visit.    Allergies  Allergen Reactions  . Albumin (Human) Rash  . Amoxicillin-Pot Clavulanate Hives  . Gabapentin Shortness Of Breath  . Latex Dermatitis and Rash  . Betadine [Povidone Iodine] Dermatitis, Rash and Other (See Comments)    Skin burning Topical betadine and iodine have this reaction with patient.  IV contrast is NOT a problem.  jkl  . Sucralfate     Other reaction(s): Other (See Comments) Abdominal Pain  . Amoxicillin     REACTION: rash  . Chantix  [Varenicline  Tartrate]   . Chantix [Varenicline Tartrate]     Heart racing  . Erythromycin     REACTION: rash  . Sucralfate Nausea And Vomiting    Heart racing Lightheaded     Family History  Problem Relation Age of Onset  . Hypertension Mother   . Hyperlipidemia Mother   . Diabetes Mother   . Colon cancer Father   . Pancreatic cancer Father   . Cancer Father        Colon & Pancreatic  . Breast cancer Paternal Grandmother 69  . Diabetes Paternal Grandmother   . Coronary artery disease Paternal Grandmother   . Heart failure Paternal Grandmother   . Cancer Paternal Grandmother        breast  . Stroke Brother     Social History   Socioeconomic History  . Marital status: Married    Spouse name: Not on file  . Number of children: 2  . Years of education: Not on  file  . Highest education level: Not on file  Occupational History  . Occupation: Scientist, water quality  Tobacco Use  . Smoking status: Current Every Day Smoker    Packs/day: 0.25    Years: 39.00    Pack years: 9.75    Types: Cigarettes  . Smokeless tobacco: Never Used  . Tobacco comment: e cigs currently--trying to quit  Substance and Sexual Activity  . Alcohol use: No  . Drug use: No  . Sexual activity: Not Currently    Partners: Male  Other Topics Concern  . Not on file  Social History Narrative   Lives in [place] with husband. Dog in home. Work - disabled for neck and back pain.   Social Determinants of Health   Financial Resource Strain:   . Difficulty of Paying Living Expenses:   Food Insecurity:   . Worried About Charity fundraiser in the Last Year:   . Arboriculturist in the Last Year:   Transportation Needs:   . Film/video editor (Medical):   Marland Kitchen Lack of Transportation (Non-Medical):   Physical Activity:   . Days of Exercise per Week:   . Minutes of Exercise per Session:   Stress:   . Feeling of Stress :   Social Connections:   . Frequency of Communication with Friends and Family:   . Frequency of Social Gatherings with Friends and Family:   . Attends Religious Services:   . Active Member of Clubs or Organizations:   . Attends Archivist Meetings:   Marland Kitchen Marital Status:   Intimate Partner Violence:   . Fear of Current or Ex-Partner:   . Emotionally Abused:   Marland Kitchen Physically Abused:   . Sexually Abused:      Constitutional: Denies fever, malaise, fatigue, headache or abrupt weight changes.  HEENT: Pt reports left ear pain. Denies eye pain, eye redness, ear pain, ringing in the ears, wax buildup, runny nose, nasal congestion, bloody nose, or sore throat. Respiratory: Denies difficulty breathing, shortness of breath, cough or sputum production.   Cardiovascular: Denies chest pain, chest tightness, palpitations or swelling in the hands or feet.  Skin: Pt  reports rash between breast. Denies lesions or ulcercations.  Neurological: Denies dizziness, difficulty with memory, difficulty with speech or problems with balance and coordination.    No other specific complaints in a complete review of systems (except as listed in HPI above).  Objective:   Physical Exam   BP 128/80   Pulse 78   Temp  97.7 F (36.5 C) (Temporal)   Wt 181 lb (82.1 kg)   SpO2 97%   BMI 30.12 kg/m   Wt Readings from Last 3 Encounters:  05/28/20 181 lb (82.1 kg)  05/21/20 180 lb (81.6 kg)  05/25/19 184 lb 3.2 oz (83.6 kg)    General: Appears their stated age, obese, in NAD. Skin: Convalescent smooth rash with raised edges noted in between breast. HEENT: Head: normal shape and size; Ears: Tm's gray and intact, normal light reflex, mild wax buildup noted;  Cardiovascular: Normal rate and rhythm. S1,S2 noted.  No murmur, rubs or gallops noted. No JVD or BLE edema. No carotid bruits noted. Pulmonary/Chest: Normal effort and positive vesicular breath sounds. No respiratory distress. No wheezes, rales or ronchi noted.  Neurological: Alert and oriented.    BMET    Component Value Date/Time   NA 140 05/25/2019 1010   NA 138 04/10/2019 1439   K 4.9 05/25/2019 1010   CL 105 05/25/2019 1010   CO2 27 05/25/2019 1010   GLUCOSE 107 (H) 05/25/2019 1010   BUN 12 05/25/2019 1010   BUN 16 04/10/2019 1439   CREATININE 1.10 (H) 05/23/2020 1131   CALCIUM 9.3 05/25/2019 1010   GFRNONAA >60 05/25/2019 1010   GFRAA >60 05/25/2019 1010    Lipid Panel     Component Value Date/Time   CHOL 189 03/01/2018 1131   TRIG 85.0 03/01/2018 1131   HDL 58.10 03/01/2018 1131   CHOLHDL 3 03/01/2018 1131   VLDL 17.0 03/01/2018 1131   LDLCALC 114 (H) 03/01/2018 1131    CBC    Component Value Date/Time   WBC 4.6 05/25/2019 1010   RBC 4.66 05/25/2019 1010   HGB 14.0 05/25/2019 1010   HCT 43.8 05/25/2019 1010   PLT 216 05/25/2019 1010   MCV 94.0 05/25/2019 1010   MCH 30.0  05/25/2019 1010   MCHC 32.0 05/25/2019 1010   RDW 13.2 05/25/2019 1010   LYMPHSABS 1.1 05/25/2019 1010   MONOABS 0.3 05/25/2019 1010   EOSABS 0.2 05/25/2019 1010   BASOSABS 0.0 05/25/2019 1010    Hgb A1C No results found for: HGBA1C         Assessment & Plan:   Otalgia, Left:  Exam benign No indication of infection Can try Flonase 1 spray left nostril BID x 3 days  Yeast Infection of the Skin:  RX for Nystatin powders Advised her to keep area dry as possible  Carotid Stenosis:  Discussed the importance of BP control, lipid control and smoking cessation Lipid panel today Continue daily ASA Consider vascular surgery referral pending results of carotid doppler   Will follow up after labs, return precautions discussed [name], NP This visit occurred during the SARS-CoV-2 public health emergency.  Safety protocols were in place, including screening questions prior to the visit, additional usage of staff PPE, and extensive cleaning of exam room while observing appropriate contact time as indicated for disinfecting solutions.

## 2020-06-05 DIAGNOSIS — J301 Allergic rhinitis due to pollen: Secondary | ICD-10-CM | POA: Diagnosis not present

## 2020-06-05 LAB — LIPID PANEL
Cholesterol: 195 mg/dL (ref 0–200)
HDL: 50.8 mg/dL (ref >39.00)
LDL Cholesterol: 121 mg/dL — ABNORMAL HIGH (ref 0–99)
NonHDL: 144.06
Total CHOL/HDL Ratio: 4
Triglycerides: 113 mg/dL (ref 0.0–149.0)
VLDL: 22.6 mg/dL (ref 0.0–40.0)

## 2020-06-07 ENCOUNTER — Telehealth: Payer: Self-pay | Admitting: *Deleted

## 2020-06-07 ENCOUNTER — Ambulatory Visit (INDEPENDENT_AMBULATORY_CARE_PROVIDER_SITE_OTHER): Payer: Medicare PPO

## 2020-06-07 ENCOUNTER — Other Ambulatory Visit: Payer: Self-pay

## 2020-06-07 DIAGNOSIS — I6523 Occlusion and stenosis of bilateral carotid arteries: Secondary | ICD-10-CM | POA: Diagnosis not present

## 2020-06-07 NOTE — Progress Notes (Unsigned)
   Patient ID: Heather Beasley, female    DOB: 01-19-1956, 64 y.o.   MRN: [id number]  Dr Rockey Situ consulted regarding the significant stenosis found during a carotid duplex exam this afternoon. He approved release of the patient from the office. Sent [name] a high-priority Epic message regarding study at the request of Dr Rockey Situ. Vascular reading Doctor messaged regarding preliminary result. RN [name] also contacted [name] office via telephone and spoke with her CMA. Phone note has been entered into Epic by RN Rimmer    Preliminary result sent to Epic     Review of Systems    Physical Exam

## 2020-06-07 NOTE — Telephone Encounter (Signed)
Patient here with Ultrasound Technologist for Carotid doppler. Per Pilar Jarvis, technologist, patient has long, very tight stenosis.  Dr Rockey Situ has reviewed this with Heather Beasley and requests we call ordering provider, Heather Silversmith, NP, with this information. Dr Rockey Situ said it was ok for the patient to go home. Carotid doppler will be read tonight and be available by tomorrow for Heather Beasley to address. Dr Rockey Situ suggests patient will need expedited Vascular consult for this finding.  Spoke with Melody, CMA. She works with [employer].  She will send the message to [facility] concerning the patient's report to follow up.

## 2020-06-10 ENCOUNTER — Encounter: Payer: Self-pay | Admitting: Internal Medicine

## 2020-06-18 ENCOUNTER — Encounter (INDEPENDENT_AMBULATORY_CARE_PROVIDER_SITE_OTHER): Payer: Self-pay | Admitting: Vascular Surgery

## 2020-06-18 ENCOUNTER — Other Ambulatory Visit: Payer: Self-pay

## 2020-06-18 ENCOUNTER — Ambulatory Visit (INDEPENDENT_AMBULATORY_CARE_PROVIDER_SITE_OTHER): Payer: Medicare PPO | Admitting: Vascular Surgery

## 2020-06-18 VITALS — BP 133/83 | HR 86 | Resp 16 | Ht 65.5 in | Wt 180.0 lb

## 2020-06-18 DIAGNOSIS — C319 Malignant neoplasm of accessory sinus, unspecified: Secondary | ICD-10-CM

## 2020-06-18 DIAGNOSIS — I6523 Occlusion and stenosis of bilateral carotid arteries: Secondary | ICD-10-CM

## 2020-06-18 DIAGNOSIS — I10 Essential (primary) hypertension: Secondary | ICD-10-CM | POA: Diagnosis not present

## 2020-06-18 DIAGNOSIS — C300 Malignant neoplasm of nasal cavity: Secondary | ICD-10-CM

## 2020-06-18 DIAGNOSIS — E7800 Pure hypercholesterolemia, unspecified: Secondary | ICD-10-CM

## 2020-06-18 DIAGNOSIS — I6529 Occlusion and stenosis of unspecified carotid artery: Secondary | ICD-10-CM | POA: Insufficient documentation

## 2020-06-18 MED ORDER — CLOPIDOGREL BISULFATE 75 MG PO TABS: 75.0000 mg | ORAL_TABLET | Freq: Every day | ORAL | 6 refills | Status: DC

## 2020-06-18 NOTE — H&P (View-Only) (Signed)
Patient ID: Heather Beasley, female   DOB: [birth year], 64 y.o.   MRN: [id number]  Chief Complaint  Patient presents with  . New Patient (Initial Visit)    ref Baity carotid stenosis    HPI Heather Beasley is a 64 y.o. female.  I am asked to see the patient by R. Baity for evaluation of carotid stenosis.  The patient reports headache and pulsatile tinnitus in the left ear.  This has been going on for several weeks now.  No obvious visual changes or arm or leg weakness or numbness.  Has felt lightheaded and dizzy.  She has a previous history of head neck cancer about 5 years ago when she received radiation treatment.  This resulted in her losing all of her teeth and getting an early cataract in her left eye.  A year ago, she had a CT scan which showed some degree of carotid stenosis but she did not know this.  A recent CT scan which I have independently reviewed showed progression of this to now what appeared to be a hemodynamically significant carotid stenosis.  This was not a CT angiogram and the degree of stenosis was very difficult to discern.  This prompted a duplex performed at the hospital which I have also reviewed.  This demonstrates 80 to 99% left ICA stenosis.  Only mild right carotid disease was seen.  With this finding, she is referred for further evaluation and treatment.   Past Medical History:  Diagnosis Date  . Allergic rhinitis   . Arthritis   . Asthma   . Cancer of nasal cavity and sinus ([place]) 07/07/2015   radiation  . Cervical spondylosis   . Colitis   . Colon polyps   . Depression   . Diverticulosis   . GERD (gastroesophageal reflux disease)   . Glaucoma   . Headaches, cluster   . Hyperlipidemia   . IBS (irritable bowel syndrome)   . Kidney stones   . Melanoma ([place])   . Neuropathy   . PONV (postoperative nausea and vomiting)   . Tobacco abuse     Past Surgical History:  Procedure Laterality Date  . ABDOMINAL HYSTERECTOMY  1998  . APPENDECTOMY  1998  . ARM  FX REPAIR  2001  . BACK SURGERY    . CERVICAL FUSION  2010  . CHOLECYSTECTOMY  08/2011   Dr Rochel Brome  . COLONOSCOPY     Dr Tiffany Kocher  . CYSTOSCOPY     Dr. Bernardo Heater  . DILATION AND CURETTAGE OF UTERUS    . ESOPHAGOGASTRODUODENOSCOPY  2012   Dr Tiffany Kocher  . FRACTURE SURGERY  2001   plate in right wrist and arm  . IMAGE GUIDED SINUS SURGERY N/A 06/25/2015   Procedure: IMAGE GUIDED SINUS SURGERY;  Surgeon: Beverly Gust, MD;  Location: [facility] ORS;  Service: ENT;  Laterality: N/A;  . LUMBAR SPINE SURGERY  07/2004  . OOPHORECTOMY    . POLYPECTOMY N/A 06/25/2015   Procedure: POLYPECTOMY NASAL;  Surgeon: Beverly Gust, MD;  Location: [facility] OR[facility];  Service: ENT;  Laterality: N/A;  . [place]  . VAGINAL DELIVERY     x2    Family History  Problem Relation Age of Onset  . Hypertension Mother   . Hyperlipidemia Mother   . Diabetes Mother   . Colon cancer Father   . Pancreatic cancer Father   . Cancer Father        Colon & Pancreatic  . Breast cancer Paternal  Grandmother 86  . Diabetes Paternal Grandmother   . Coronary artery disease Paternal Grandmother   . Heart failure Paternal Grandmother   . Cancer Paternal Grandmother        breast  . Stroke Brother      Social History   Tobacco Use  . Smoking status: Current Every Day Smoker    Packs/day: 0.25    Years: 39.00    Pack years: 9.75    Types: Cigarettes  . Smokeless tobacco: Never Used  . Tobacco comment: e cigs currently--trying to quit  Substance Use Topics  . Alcohol use: No  . Drug use: No     Allergies  Allergen Reactions  . Albumin (Human) Rash  . Amoxicillin-Pot Clavulanate Hives  . Gabapentin Shortness Of Breath  . Latex Dermatitis and Rash  . Betadine [Povidone Iodine] Dermatitis, Rash and Other (See Comments)    Skin burning Topical betadine and iodine have this reaction with patient.  IV contrast is NOT a problem.  jkl  . Sucralfate     Other reaction(s): Other (See Comments) Abdominal Pain    . Amoxicillin     REACTION: rash  . Chantix  [Varenicline Tartrate]   . Chantix [Varenicline Tartrate]     Heart racing  . Erythromycin     REACTION: rash  . Sucralfate Nausea And Vomiting    Heart racing Lightheaded     Current Outpatient Medications  Medication Sig Dispense Refill  . aspirin EC 81 MG tablet Take 81 mg by mouth daily.    . cetirizine (ZYRTEC) 10 MG tablet Take 10 mg by mouth daily. Allergies.    . colestipol (COLESTID) 1 g tablet Take by mouth.    . cyclobenzaprine (FLEXERIL) 10 MG tablet Take 1 tablet (10 mg total) by mouth 3 (three) times daily. 90 tablet 6  . EPINEPHrine 0.3 mg/0.3 mL IJ SOAJ injection Inject 1 mg into the skin as needed.    . fesoterodine (TOVIAZ) 4 MG TB24 tablet Take 1 tablet (4 mg total) by mouth daily. 90 tablet 3  . loperamide (IMODIUM A-D) 2 MG tablet Take 2 mg by mouth 2 (two) times a day.     . Multiple Vitamins-Minerals (CENTRUM ADULTS PO) Take 1 tablet by mouth.    . nystatin (MYCOSTATIN/NYSTOP) powder Apply 1 application topically 3 (three) times daily. 15 g 0  . pantoprazole (PROTONIX) 40 MG tablet Take 1 tablet (40 mg total) by mouth 2 (two) times daily. 60 tablet 11  . VENTOLIN HFA 108 (90 Base) MCG/ACT inhaler INHALE 2 PUFFS INTO THE LUNGS EVERY 6 HOURS AS NEEDED FOR WHEEZING ORSHORTNESS OF BREATH 18 g 0  . clopidogrel (PLAVIX) 75 MG tablet Take 1 tablet (75 mg total) by mouth daily. 30 tablet 6   No current facility-administered medications for this visit.      REVIEW OF SYSTEMS (Negative unless checked)  Constitutional: [] Weight loss  [] Fever  [] Chills Cardiac: [] Chest pain   [] Chest pressure   [] Palpitations   [] Shortness of breath when laying flat   [] Shortness of breath at rest   [] Shortness of breath with exertion. Vascular:  [] Pain in legs with walking   [] Pain in legs at rest   [] Pain in legs when laying flat   [] Claudication   [] Pain in feet when walking  [] Pain in feet at rest  [] Pain in feet when laying flat    [] History of DVT   [] Phlebitis   [] Swelling in legs   [] Varicose veins   [] Non-healing ulcers  Pulmonary:   [] Uses home oxygen   [] Productive cough   [] Hemoptysis   [] Wheeze  [] COPD   [x] Asthma Neurologic:  [x] Dizziness  [] Blackouts   [] Seizures   [] History of stroke   [] History of TIA  [] Aphasia   [] Temporary blindness   [] Dysphagia   [] Weakness or numbness in arms   [] Weakness or numbness in legs Musculoskeletal:  [x] Arthritis   [] Joint swelling   [x] Joint pain   [x] Low back pain Hematologic:  [] Easy bruising  [] Easy bleeding   [] Hypercoagulable state   [] Anemic  [] Hepatitis Gastrointestinal:  [] Blood in stool   [] Vomiting blood  [x] Gastroesophageal reflux/heartburn   [] Abdominal pain Genitourinary:  [] Chronic kidney disease   [] Difficult urination  [] Frequent urination  [] Burning with urination   [] Hematuria Skin:  [] Rashes   [] Ulcers   [] Wounds Psychological:  [] History of anxiety   []  History of major depression.    Physical Exam BP 133/83 (BP Location: Right Arm)   Pulse 86   Resp 16   Ht 5' 5.5" (1.664 m)   Wt 180 lb (81.6 kg)   BMI 29.50 kg/m  Gen:  WD/WN, NAD Head: [place]/AT, No temporalis wasting. Ear/Nose/Throat: Hearing grossly intact, nares w/o erythema or drainage, oropharynx w/o Erythema/Exudate Eyes: Conjunctiva clear, sclera non-icteric  Neck: trachea midline.  Left carotid bruit Pulmonary:  Good air movement, no increased labor of respiration Cardiac: RRR, no JVD Vascular:  Vessel Right Left  Radial Palpable Palpable                       Gastrointestinal: soft, non-tender/non-distended.  Musculoskeletal: M/S 5/5 throughout.  Extremities without ischemic changes.  No deformity or atrophy. No edema. Neurologic: Sensation grossly intact in extremities.  Symmetrical.  Speech is fluent. Motor exam as listed above. Psychiatric: Judgment intact, Mood & affect appropriate for pt's clinical situation. Dermatologic: No rashes or ulcers noted.  No cellulitis or open  wounds.    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 Restaging. EXAM: CT MAXILLOFACIAL WITH CONTRAST TECHNIQUE: Multidetector CT imaging of the maxillofacial structures was performed with intravenous contrast. Multiplanar CT image reconstructions were also generated. CONTRAST:  46mL OMNIPAQUE IOHEXOL 300 MG/ML  SOLN COMPARISON:  Face CT with contrast 05/22/2019 and earlier. FINDINGS: Osseous: Absent dentition. Partially visible previous cervical ACDF. Intact skull base. No acute or suspicious osseous lesion identified. Orbits: Intact orbital walls. Orbits soft tissues appear symmetric, stable, and normal. Sinuses: A right posterior nasal cavity 2.9 cm polypoid mass was demonstrated on pretreatment MRI 06/18/2015. The nasal septum remains intact and midline. Somewhat atrophic appearing bilateral nasal cavity mucosa is redemonstrated and stable since last year. Small volume retained secretions on the left. Soft tissue in the posterior nasal cavity along the posterior turbinate remains stable (series 8, image 35 today) when compared to 2019. No suspicious findings in the nasal cavity. Trace retained secretions in the midline nasopharynx. Paranasal sinuses remain clear. Tympanic cavities and mastoids remain clear. Soft tissues: Stable and negative visible pharynx, larynx, parapharyngeal spaces, retropharyngeal space, sublingual space, submandibular spaces, masticator and parotid spaces. No upper cervical lymphadenopathy. The major vascular structures in the neck and at the skull base appear patent. There is chronic carotid atherosclerosis, and suggestion today of hemodynamically significant stenosis of the distal left ICA bulb (series 2, image 68) which appears progressed since 2019. Limited intracranial: Stable and negative  visible brain  parenchyma. IMPRESSION: 1. Stable and satisfactory post treatment appearance of the right nasal cavity. 2. Suspect progressed and now hemodynamically significant stenosis of the cervical Left ICA at the distal bulb. Consider follow-up Carotid Doppler Ultrasound, or alternatively Neck CTA with contrast. Electronically Signed   By: Genevie Ann M.D.   On: 05/23/2020 20:55   VAS US CAROTID  Result Date: 06/07/2020 Carotid Arterial Duplex Study Indications:                           Patient complains of headaches every                                        afternoon for the past 3 weeks. Risk Factors:                          Hypertension, hyperlipidemia, current                                        smoker. Other Factors:                         Dr Rockey Situ was consulted immediately                                        following this exam and approved the                                        patient's release from the office. Comparison Study:                      No previous carotid duplex on record                                         See 05/23/20 CT Pre-Surgical Evaluation & Surgical     Stenosis is >2 cm distal to the LICA Correlation                            origin.ICA is not normal past stenosis.                                        Anatomy on the left is within normal                                        limits.Left bifurcation is located near                                        the Hyoid Notch. Long mid-segment LICA  stenosis. Performing Technologist: Pilar Jarvis RDMS, RVT, RDCS  Examination Guidelines: A complete evaluation includes B-mode imaging, spectral Doppler, color Doppler, and power Doppler as needed of all accessible portions of each vessel. Bilateral testing is considered an integral part of a complete examination. Limited examinations for reoccurring indications may be performed as noted.  Right Carotid Findings:  +----------+--------+--------+--------+-------------------------------+--------+           PSV cm/sEDV cm/sStenosisPlaque Description             Comments +----------+--------+--------+--------+-------------------------------+--------+ CCA Prox  84      16                                                      +----------+--------+--------+--------+-------------------------------+--------+ CCA Distal62      19      <50%    hypoechoic, smooth, focal and                                             homogeneous                             +----------+--------+--------+--------+-------------------------------+--------+ ICA Prox  65      28      1-39%   focal, hypoechoic, smooth and                                             homogeneous                             +----------+--------+--------+--------+-------------------------------+--------+ ICA Mid   91      36      1-39%                                           +----------+--------+--------+--------+-------------------------------+--------+ ICA Distal115     48                                                      +----------+--------+--------+--------+-------------------------------+--------+ ECA       79      17                                                      +----------+--------+--------+--------+-------------------------------+--------+ +----------+--------+-------+----------------+-------------------+           PSV cm/sEDV cmsDescribe        Arm Pressure (mmHG) +----------+--------+-------+----------------+-------------------+ [id number]     14     Multiphasic, [id number]                 +----------+--------+-------+----------------+-------------------+ +---------+--------+--+--------+--+---------+ VertebralPSV cm/s51EDV cm/s15Antegrade +---------+--------+--+--------+--+---------+  Left Carotid Findings:  +----------+--------+--------+--------+--------------------+-------------------+  PSV cm/sEDV cm/sStenosisPlaque Description  Comments            +----------+--------+--------+--------+--------------------+-------------------+ CCA Prox  70      15                                                      +----------+--------+--------+--------+--------------------+-------------------+ CCA Distal56      18                                                      +----------+--------+--------+--------+--------------------+-------------------+ ICA Prox  48      10      1-39%                                           +----------+--------+--------+--------+--------------------+-------------------+ ICA Mid   392     174     80-99%  hypoechoic,         High resistant flow                                   homogeneous and                                                           smooth                                  +----------+--------+--------+--------+--------------------+-------------------+ ICA Distal356     119                                                     +----------+--------+--------+--------+--------------------+-------------------+ ECA       62      12                                                      +----------+--------+--------+--------+--------------------+-------------------+ +----------+--------+--------+----------------+-------------------+           PSV cm/sEDV cm/sDescribe        Arm Pressure (mmHG) +----------+--------+--------+----------------+-------------------+ [id number]              Multiphasic, [id number]                 +----------+--------+--------+----------------+-------------------+ +---------+--------+--+--------+--+---------+ VertebralPSV cm/s99EDV cm/s32Antegrade +---------+--------+--+--------+--+---------+ Pre-stenotic velocity of 52/27 cm/s and post-stenotic velocity of 356/119 cm/s  Summary: Right  Carotid: Velocities in the right ICA are consistent with a 1-39% stenosis.                Non-hemodynamically significant plaque <50% noted in the  CCA. The                ECA appears <50% stenosed. Left Carotid: Velocities in the left ICA are consistent with a 80-99% stenosis.               Non-hemodynamically significant plaque <50% noted in the CCA. The               ECA appears <50% stenosed. Vertebrals:  Bilateral vertebral arteries demonstrate antegrade flow. Subclavians: Normal flow hemodynamics were seen in bilateral subclavian              arteries. *See table(s) above for measurements and observations.  Vascular consult recommended. Electronically signed by Larae Grooms MD on 06/07/2020 at 5:08:26 PM.    Final     Labs Recent Results (from the past 2160 hour(s))  I-STAT creatinine     Status: Abnormal   Collection Time: 05/23/20 11:31 AM  Result Value Ref Range   Creatinine, Ser 1.10 (H) 0.44 - 1.00 mg/dL  Lipid panel     Status: Abnormal   Collection Time: 06/04/20  3:21 PM  Result Value Ref Range   Cholesterol 195 0 - 200 mg/dL    Comment: ATP III Classification       Desirable:  < 200 mg/dL               Borderline High:  200 - 239 mg/dL          High:  > = 240 mg/dL   Triglycerides 113.0 0 - 149 mg/dL    Comment: Normal:  <150 mg/dLBorderline High:  150 - 199 mg/dL   HDL 50.80 >39.00 mg/dL   VLDL 22.6 0.0 - 40.0 mg/dL   LDL Cholesterol 121 (H) 0 - 99 mg/dL   Total CHOL/HDL Ratio 4     Comment:                Men          Women1/2 Average Risk     3.4          3.3Average Risk          5.0          4.42X Average Risk          9.6          7.13X Average Risk          15.0          11.0                       NonHDL 144.06     Comment: NOTE:  Non-HDL goal should be 30 mg/dL higher than patient's LDL goal (i.e. LDL goal of < 70 mg/dL, would have non-HDL goal of < 100 mg/dL)    Assessment/Plan:  HLD (hyperlipidemia) lipid control important in reducing the progression of  atherosclerotic disease.  We will plan statin therapy with stent placement going forward.  Would recommend this for at least 90 days.   Essential hypertension, benign blood pressure control important in reducing the progression of atherosclerotic disease. On appropriate oral medications.   Cancer of nasal cavity and sinus Status post radiation several years ago which is likely the cause of her current carotid stenosis in combination with some atherosclerotic disease.  Carotid stenosis A recent CT scan which I have independently reviewed showed progression of this to now what appeared to be a hemodynamically significant carotid stenosis.  This was not  a CT angiogram and the degree of stenosis was very difficult to discern.  This prompted a duplex performed at the hospital which I have also reviewed.  This demonstrates 80 to 99% left ICA stenosis.  Only mild right carotid disease was seen. Given her previous radiation, open surgical therapy is far more concerning for wound complications, recurrence, and generally would be avoided unless absolutely necessary.  Although her CT scan does not show her aortic arch well, it would appear that her anatomy is suitable at least in the cervical portion for carotid stenting.  Carotid stenting would clearly be the preferred option for a patient with radiation-induced stenosis.  I have went ahead and started her on Plavix reparation for left carotid stent placement in the next 1 to 2 weeks.  I had a long discussion with her today about the risks and benefits of the procedure.  I discussed the reason and rationale for treatment and reduction of stroke risk.  She is agreeable to proceed.      [name] 06/18/2020, 9:53 AM   This note was created with Dragon medical transcription system.  Any errors from dictation are unintentional.

## 2020-06-18 NOTE — Assessment & Plan Note (Signed)
A recent CT scan which I have independently reviewed showed progression of this to now what appeared to be a hemodynamically significant carotid stenosis.  This was not a CT angiogram and the degree of stenosis was very difficult to discern.  This prompted a duplex performed at the hospital which I have also reviewed.  This demonstrates 80 to 99% left ICA stenosis.  Only mild right carotid disease was seen. Given her previous radiation, open surgical therapy is far more concerning for wound complications, recurrence, and generally would be avoided unless absolutely necessary.  Although her CT scan does not show her aortic arch well, it would appear that her anatomy is suitable at least in the cervical portion for carotid stenting.  Carotid stenting would clearly be the preferred option for a patient with radiation-induced stenosis.  I have went ahead and started her on Plavix reparation for left carotid stent placement in the next 1 to 2 weeks.  I had a long discussion with her today about the risks and benefits of the procedure.  I discussed the reason and rationale for treatment and reduction of stroke risk.  She is agreeable to proceed.

## 2020-06-18 NOTE — Patient Instructions (Signed)
Carotid Angioplasty With Stent Carotid angioplasty with stent is a procedure to open or widen an artery in the neck (carotid artery) that has become narrowed. This is done by inflating a small balloon inside the artery and then placing a small piece of metal that looks like a coil or spring (stent) inside the artery. The stent helps keep the artery open by supporting the artery walls. The carotid arteries supply blood to the brain. When fats, cholesterol, and other materials (plaque) build up in an artery, the artery becomes narrow and can become blocked. This can reduce or block blood flow to certain areas of the brain, which can cause serious health problems, including stroke. Tell a health care provider about:  Any allergies you have.  All medicines you are taking, including vitamins, herbs, eye drops, creams, and over-the-counter medicines.  Any problems you or family members have had with anesthetic medicines.  Any blood disorders you have.  Any surgeries you have had.  Any medical conditions you have.  Whether you are pregnant or may be pregnant. What are the risks? Generally, this is a safe procedure. However, problems may occur, including:  Infection.  Bleeding.  Allergic reactions to medicines or dyes.  Damage to other structures or organs, or to the carotid artery itself.  The carotid artery becoming blocked again.  A collection of blood under the skin (hematoma) around the stent site that gets larger.  A blood clot in another part of the body.  Kidney injury.  Stroke.  Heart attack. What happens before the procedure?  Ask your health care provider about: ? Changing or stopping your regular medicines. This is especially important if you are taking diabetes medicines or blood thinners. ? Whether aspirin is recommended before this procedure. ? Taking over-the-counter medicines, vitamins, herbs, and supplements.  Follow instructions from your health care provider  about eating or drinking restrictions.  Do not use any products that contain nicotine or tobacco for 4 weeks before the procedure. These products include cigarettes, e-cigarettes, and chewing tobacco. If you need help quitting, ask your health care provider.  Ask your health care provider what steps will be taken to help prevent infection. These may include: ? Removing hair at the surgery site. ? Washing skin with a germ-killing soap. ? Taking antibiotic medicine.  You may have blood tests and imaging tests done.  Plan to have someone take you home from the hospital or clinic.  If you will be going home right after the procedure, plan to have someone with you for 24 hours. What happens during the procedure?   An IV will be inserted into one of your veins.  You may be given one or more of the following: ? A medicine to help you relax (sedative). ? A medicine to numb the area where the catheter will be inserted (local anesthetic).  Most commonly, an incision will be made in your groin. In some cases, an incision may be made in your wrist or forearm instead of your groin.  A small, thin tube (catheter) will be inserted through your incision, into an artery. The catheter will be threaded upward into your carotid artery. An X-ray machine (fluoroscope) will help your health care provider guide the catheter to the correct place in your artery.  Dye will be injected into the catheter and will travel to the narrow or blocked part of your carotid artery.  X-ray images will be taken of how the dye flows through your artery. While the images  are being taken, you may be given instructions about breathing, swallowing, moving, or talking.  A filter (distal protection device) will be inserted into your artery. This will be used to catch plaque that comes loose in your artery during the procedure. This reduces the risk of plaque moving into your brain.  A small balloon will be inserted into your  artery. The balloon will be inflated for a few seconds to widen your artery and will then be removed.  The stent will be placed in your artery.  A second small balloon will be inserted into your artery and inflated. This expands the stent inside of your artery so that the stent holds up the artery walls. The balloon will then be removed.  The catheter and the distal protection device will be removed from your artery.  Your incision may be closed with stitches (sutures), skin glue, or adhesive tape.  A bandage (dressing) will be placed over your incision. The procedure may vary among health care providers and hospitals. What happens after the procedure?  Your blood pressure, heart rate, breathing rate, and blood oxygen level will be monitored until you leave the hospital or clinic.  You may continue to receive fluids and medicines through an IV.  You may need to have pressure placed on the incision site to prevent bleeding.  You will need to keep the area still for a few hours, or as long as directed by your health care provider. If the procedure was done in the groin, you will be instructed not to bend or cross your legs.  You may have some pain. Pain medicines will be available to help you.  You may have a test that uses sound waves to take pictures (ultrasound) of the carotid artery. This can be compared to future tests to check for changes in the artery.  Do not drive for 24 hours. Summary  Carotid angioplasty with stent is a procedure to open or widen an artery in the neck (carotid artery) that has become narrowed.  The procedure is done to lower the risk of problems that can result from reduced blood flow to the brain, including a stroke.  The stent placed inside the artery will help keep the artery open by supporting the artery walls.  Follow instructions from your health care provider about taking medicines and about eating and drinking before the procedure. This  information is not intended to replace advice given to you by your health care provider. Make sure you discuss any questions you have with your health care provider. Document Revised: 08/09/2018 Document Reviewed: 08/04/2018 [employer]vier Patient Education  2020 [employer].

## 2020-06-18 NOTE — Assessment & Plan Note (Signed)
Status post radiation several years ago which is likely the cause of her current carotid stenosis in combination with some atherosclerotic disease.

## 2020-06-18 NOTE — Assessment & Plan Note (Signed)
lipid control important in reducing the progression of atherosclerotic disease.  We will plan statin therapy with stent placement going forward.  Would recommend this for at least 90 days.

## 2020-06-18 NOTE — Assessment & Plan Note (Signed)
blood pressure control important in reducing the progression of atherosclerotic disease. On appropriate oral medications.  

## 2020-06-18 NOTE — Progress Notes (Signed)
Patient ID: Heather Beasley, female   DOB: [birth year], 64 y.o.   MRN: [id number]  Chief Complaint  Patient presents with  . New Patient (Initial Visit)    ref Baity carotid stenosis    HPI Heather Beasley is a 64 y.o. female.  I am asked to see the patient by R. Baity for evaluation of carotid stenosis.  The patient reports headache and pulsatile tinnitus in the left ear.  This has been going on for several weeks now.  No obvious visual changes or arm or leg weakness or numbness.  Has felt lightheaded and dizzy.  She has a previous history of head neck cancer about 5 years ago when she received radiation treatment.  This resulted in her losing all of her teeth and getting an early cataract in her left eye.  A year ago, she had a CT scan which showed some degree of carotid stenosis but she did not know this.  A recent CT scan which I have independently reviewed showed progression of this to now what appeared to be a hemodynamically significant carotid stenosis.  This was not a CT angiogram and the degree of stenosis was very difficult to discern.  This prompted a duplex performed at the hospital which I have also reviewed.  This demonstrates 80 to 99% left ICA stenosis.  Only mild right carotid disease was seen.  With this finding, she is referred for further evaluation and treatment.   Past Medical History:  Diagnosis Date  . Allergic rhinitis   . Arthritis   . Asthma   . Cancer of nasal cavity and sinus ([place]) 07/07/2015   radiation  . Cervical spondylosis   . Colitis   . Colon polyps   . Depression   . Diverticulosis   . GERD (gastroesophageal reflux disease)   . Glaucoma   . Headaches, cluster   . Hyperlipidemia   . IBS (irritable bowel syndrome)   . Kidney stones   . Melanoma ([place])   . Neuropathy   . PONV (postoperative nausea and vomiting)   . Tobacco abuse     Past Surgical History:  Procedure Laterality Date  . ABDOMINAL HYSTERECTOMY  1998  . APPENDECTOMY  1998  . ARM  FX REPAIR  2001  . BACK SURGERY    . CERVICAL FUSION  2010  . CHOLECYSTECTOMY  08/2011   Dr Rochel Brome  . COLONOSCOPY     Dr Tiffany Kocher  . CYSTOSCOPY     Dr. Bernardo Heater  . DILATION AND CURETTAGE OF UTERUS    . ESOPHAGOGASTRODUODENOSCOPY  2012   Dr Tiffany Kocher  . FRACTURE SURGERY  2001   plate in right wrist and arm  . IMAGE GUIDED SINUS SURGERY N/A 06/25/2015   Procedure: IMAGE GUIDED SINUS SURGERY;  Surgeon: Beverly Gust, MD;  Location: [facility] ORS;  Service: ENT;  Laterality: N/A;  . LUMBAR SPINE SURGERY  07/2004  . OOPHORECTOMY    . POLYPECTOMY N/A 06/25/2015   Procedure: POLYPECTOMY NASAL;  Surgeon: Beverly Gust, MD;  Location: [facility] ORS;  Service: ENT;  Laterality: N/A;  . [place]  . VAGINAL DELIVERY     x2    Family History  Problem Relation Age of Onset  . Hypertension Mother   . Hyperlipidemia Mother   . Diabetes Mother   . Colon cancer Father   . Pancreatic cancer Father   . Cancer Father        Colon & Pancreatic  . Breast cancer Paternal  Grandmother 78  . Diabetes Paternal Grandmother   . Coronary artery disease Paternal Grandmother   . Heart failure Paternal Grandmother   . Cancer Paternal Grandmother        breast  . Stroke Brother      Social History   Tobacco Use  . Smoking status: Current Every Day Smoker    Packs/day: 0.25    Years: 39.00    Pack years: 9.75    Types: Cigarettes  . Smokeless tobacco: Never Used  . Tobacco comment: e cigs currently--trying to quit  Substance Use Topics  . Alcohol use: No  . Drug use: No     Allergies  Allergen Reactions  . Albumin (Human) Rash  . Amoxicillin-Pot Clavulanate Hives  . Gabapentin Shortness Of Breath  . Latex Dermatitis and Rash  . Betadine [Povidone Iodine] Dermatitis, Rash and Other (See Comments)    Skin burning Topical betadine and iodine have this reaction with patient.  IV contrast is NOT a problem.  jkl  . Sucralfate     Other reaction(s): Other (See Comments) Abdominal Pain    . Amoxicillin     REACTION: rash  . Chantix  [Varenicline Tartrate]   . Chantix [Varenicline Tartrate]     Heart racing  . Erythromycin     REACTION: rash  . Sucralfate Nausea And Vomiting    Heart racing Lightheaded     Current Outpatient Medications  Medication Sig Dispense Refill  . aspirin EC 81 MG tablet Take 81 mg by mouth daily.    . cetirizine (ZYRTEC) 10 MG tablet Take 10 mg by mouth daily. Allergies.    . colestipol (COLESTID) 1 g tablet Take by mouth.    . cyclobenzaprine (FLEXERIL) 10 MG tablet Take 1 tablet (10 mg total) by mouth 3 (three) times daily. 90 tablet 6  . EPINEPHrine 0.3 mg/0.3 mL IJ SOAJ injection Inject 1 mg into the skin as needed.    . fesoterodine (TOVIAZ) 4 MG TB24 tablet Take 1 tablet (4 mg total) by mouth daily. 90 tablet 3  . loperamide (IMODIUM A-D) 2 MG tablet Take 2 mg by mouth 2 (two) times a day.     . Multiple Vitamins-Minerals (CENTRUM ADULTS PO) Take 1 tablet by mouth.    . nystatin (MYCOSTATIN/NYSTOP) powder Apply 1 application topically 3 (three) times daily. 15 g 0  . pantoprazole (PROTONIX) 40 MG tablet Take 1 tablet (40 mg total) by mouth 2 (two) times daily. 60 tablet 11  . VENTOLIN HFA 108 (90 Base) MCG/ACT inhaler INHALE 2 PUFFS INTO THE LUNGS EVERY 6 HOURS AS NEEDED FOR WHEEZING ORSHORTNESS OF BREATH 18 g 0  . clopidogrel (PLAVIX) 75 MG tablet Take 1 tablet (75 mg total) by mouth daily. 30 tablet 6   No current facility-administered medications for this visit.      REVIEW OF SYSTEMS (Negative unless checked)  Constitutional: [] Weight loss  [] Fever  [] Chills Cardiac: [] Chest pain   [] Chest pressure   [] Palpitations   [] Shortness of breath when laying flat   [] Shortness of breath at rest   [] Shortness of breath with exertion. Vascular:  [] Pain in legs with walking   [] Pain in legs at rest   [] Pain in legs when laying flat   [] Claudication   [] Pain in feet when walking  [] Pain in feet at rest  [] Pain in feet when laying flat    [] History of DVT   [] Phlebitis   [] Swelling in legs   [] Varicose veins   [] Non-healing ulcers  Pulmonary:   [] Uses home oxygen   [] Productive cough   [] Hemoptysis   [] Wheeze  [] COPD   [x] Asthma Neurologic:  [x] Dizziness  [] Blackouts   [] Seizures   [] History of stroke   [] History of TIA  [] Aphasia   [] Temporary blindness   [] Dysphagia   [] Weakness or numbness in arms   [] Weakness or numbness in legs Musculoskeletal:  [x] Arthritis   [] Joint swelling   [x] Joint pain   [x] Low back pain Hematologic:  [] Easy bruising  [] Easy bleeding   [] Hypercoagulable state   [] Anemic  [] Hepatitis Gastrointestinal:  [] Blood in stool   [] Vomiting blood  [x] Gastroesophageal reflux/heartburn   [] Abdominal pain Genitourinary:  [] Chronic kidney disease   [] Difficult urination  [] Frequent urination  [] Burning with urination   [] Hematuria Skin:  [] Rashes   [] Ulcers   [] Wounds Psychological:  [] History of anxiety   []  History of major depression.    Physical Exam BP 133/83 (BP Location: Right Arm)   Pulse 86   Resp 16   Ht 5' 5.5" (1.664 m)   Wt 180 lb (81.6 kg)   BMI 29.50 kg/m  Gen:  WD/WN, NAD Head: [place]/AT, No temporalis wasting. Ear/Nose/Throat: Hearing grossly intact, nares w/o erythema or drainage, oropharynx w/o Erythema/Exudate Eyes: Conjunctiva clear, sclera non-icteric  Neck: trachea midline.  Left carotid bruit Pulmonary:  Good air movement, no increased labor of respiration Cardiac: RRR, no JVD Vascular:  Vessel Right Left  Radial Palpable Palpable                       Gastrointestinal: soft, non-tender/non-distended.  Musculoskeletal: M/S 5/5 throughout.  Extremities without ischemic changes.  No deformity or atrophy. No edema. Neurologic: Sensation grossly intact in extremities.  Symmetrical.  Speech is fluent. Motor exam as listed above. Psychiatric: Judgment intact, Mood & affect appropriate for pt's clinical situation. Dermatologic: No rashes or ulcers noted.  No cellulitis or open  wounds.    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 Restaging. EXAM: CT MAXILLOFACIAL WITH CONTRAST TECHNIQUE: Multidetector CT imaging of the maxillofacial structures was performed with intravenous contrast. Multiplanar CT image reconstructions were also generated. CONTRAST:  72mL OMNIPAQUE IOHEXOL 300 MG/ML  SOLN COMPARISON:  Face CT with contrast 05/22/2019 and earlier. FINDINGS: Osseous: Absent dentition. Partially visible previous cervical ACDF. Intact skull base. No acute or suspicious osseous lesion identified. Orbits: Intact orbital walls. Orbits soft tissues appear symmetric, stable, and normal. Sinuses: A right posterior nasal cavity 2.9 cm polypoid mass was demonstrated on pretreatment MRI 06/18/2015. The nasal septum remains intact and midline. Somewhat atrophic appearing bilateral nasal cavity mucosa is redemonstrated and stable since last year. Small volume retained secretions on the left. Soft tissue in the posterior nasal cavity along the posterior turbinate remains stable (series 8, image 35 today) when compared to 2019. No suspicious findings in the nasal cavity. Trace retained secretions in the midline nasopharynx. Paranasal sinuses remain clear. Tympanic cavities and mastoids remain clear. Soft tissues: Stable and negative visible pharynx, larynx, parapharyngeal spaces, retropharyngeal space, sublingual space, submandibular spaces, masticator and parotid spaces. No upper cervical lymphadenopathy. The major vascular structures in the neck and at the skull base appear patent. There is chronic carotid atherosclerosis, and suggestion today of hemodynamically significant stenosis of the distal left ICA bulb (series 2, image 68) which appears progressed since 2019. Limited intracranial: Stable and negative  visible brain  parenchyma. IMPRESSION: 1. Stable and satisfactory post treatment appearance of the right nasal cavity. 2. Suspect progressed and now hemodynamically significant stenosis of the cervical Left ICA at the distal bulb. Consider follow-up Carotid Doppler Ultrasound, or alternatively Neck CTA with contrast. Electronically Signed   By: Genevie Ann M.D.   On: 05/23/2020 20:55   VAS US CAROTID  Result Date: 06/07/2020 Carotid Arterial Duplex Study Indications:                           Patient complains of headaches every                                        afternoon for the past 3 weeks. Risk Factors:                          Hypertension, hyperlipidemia, current                                        smoker. Other Factors:                         Dr Rockey Situ was consulted immediately                                        following this exam and approved the                                        patient's release from the office. Comparison Study:                      No previous carotid duplex on record                                         See 05/23/20 CT Pre-Surgical Evaluation & Surgical     Stenosis is >2 cm distal to the LICA Correlation                            origin.ICA is not normal past stenosis.                                        Anatomy on the left is within normal                                        limits.Left bifurcation is located near                                        the Hyoid Notch. Long mid-segment LICA  stenosis. Performing Technologist: Pilar Jarvis RDMS, RVT, RDCS  Examination Guidelines: A complete evaluation includes B-mode imaging, spectral Doppler, color Doppler, and power Doppler as needed of all accessible portions of each vessel. Bilateral testing is considered an integral part of a complete examination. Limited examinations for reoccurring indications may be performed as noted.  Right Carotid Findings:  +----------+--------+--------+--------+-------------------------------+--------+           PSV cm/sEDV cm/sStenosisPlaque Description             Comments +----------+--------+--------+--------+-------------------------------+--------+ CCA Prox  84      16                                                      +----------+--------+--------+--------+-------------------------------+--------+ CCA Distal62      19      <50%    hypoechoic, smooth, focal and                                             homogeneous                             +----------+--------+--------+--------+-------------------------------+--------+ ICA Prox  65      28      1-39%   focal, hypoechoic, smooth and                                             homogeneous                             +----------+--------+--------+--------+-------------------------------+--------+ ICA Mid   91      36      1-39%                                           +----------+--------+--------+--------+-------------------------------+--------+ ICA Distal115     48                                                      +----------+--------+--------+--------+-------------------------------+--------+ ECA       79      17                                                      +----------+--------+--------+--------+-------------------------------+--------+ +----------+--------+-------+----------------+-------------------+           PSV cm/sEDV cmsDescribe        Arm Pressure (mmHG) +----------+--------+-------+----------------+-------------------+ [id number]     14     Multiphasic, [id number]                 +----------+--------+-------+----------------+-------------------+ +---------+--------+--+--------+--+---------+ VertebralPSV cm/s51EDV cm/s15Antegrade +---------+--------+--+--------+--+---------+  Left Carotid Findings:  +----------+--------+--------+--------+--------------------+-------------------+  PSV cm/sEDV cm/sStenosisPlaque Description  Comments            +----------+--------+--------+--------+--------------------+-------------------+ CCA Prox  70      15                                                      +----------+--------+--------+--------+--------------------+-------------------+ CCA Distal56      18                                                      +----------+--------+--------+--------+--------------------+-------------------+ ICA Prox  48      10      1-39%                                           +----------+--------+--------+--------+--------------------+-------------------+ ICA Mid   392     174     80-99%  hypoechoic,         High resistant flow                                   homogeneous and                                                           smooth                                  +----------+--------+--------+--------+--------------------+-------------------+ ICA Distal356     119                                                     +----------+--------+--------+--------+--------------------+-------------------+ ECA       62      12                                                      +----------+--------+--------+--------+--------------------+-------------------+ +----------+--------+--------+----------------+-------------------+           PSV cm/sEDV cm/sDescribe        Arm Pressure (mmHG) +----------+--------+--------+----------------+-------------------+ [id number]              Multiphasic, [id number]                 +----------+--------+--------+----------------+-------------------+ +---------+--------+--+--------+--+---------+ VertebralPSV cm/s99EDV cm/s32Antegrade +---------+--------+--+--------+--+---------+ Pre-stenotic velocity of 52/27 cm/s and post-stenotic velocity of 356/119 cm/s  Summary: Right  Carotid: Velocities in the right ICA are consistent with a 1-39% stenosis.                Non-hemodynamically significant plaque <50% noted in the  CCA. The                ECA appears <50% stenosed. Left Carotid: Velocities in the left ICA are consistent with a 80-99% stenosis.               Non-hemodynamically significant plaque <50% noted in the CCA. The               ECA appears <50% stenosed. Vertebrals:  Bilateral vertebral arteries demonstrate antegrade flow. Subclavians: Normal flow hemodynamics were seen in bilateral subclavian              arteries. *See table(s) above for measurements and observations.  Vascular consult recommended. Electronically signed by Larae Grooms MD on 06/07/2020 at 5:08:26 PM.    Final     Labs Recent Results (from the past 2160 hour(s))  I-STAT creatinine     Status: Abnormal   Collection Time: 05/23/20 11:31 AM  Result Value Ref Range   Creatinine, Ser 1.10 (H) 0.44 - 1.00 mg/dL  Lipid panel     Status: Abnormal   Collection Time: 06/04/20  3:21 PM  Result Value Ref Range   Cholesterol 195 0 - 200 mg/dL    Comment: ATP III Classification       Desirable:  < 200 mg/dL               Borderline High:  200 - 239 mg/dL          High:  > = 240 mg/dL   Triglycerides 113.0 0 - 149 mg/dL    Comment: Normal:  <150 mg/dLBorderline High:  150 - 199 mg/dL   HDL 50.80 >39.00 mg/dL   VLDL 22.6 0.0 - 40.0 mg/dL   LDL Cholesterol 121 (H) 0 - 99 mg/dL   Total CHOL/HDL Ratio 4     Comment:                Men          Women1/2 Average Risk     3.4          3.3Average Risk          5.0          4.42X Average Risk          9.6          7.13X Average Risk          15.0          11.0                       NonHDL 144.06     Comment: NOTE:  Non-HDL goal should be 30 mg/dL higher than patient's LDL goal (i.e. LDL goal of < 70 mg/dL, would have non-HDL goal of < 100 mg/dL)    Assessment/Plan:  HLD (hyperlipidemia) lipid control important in reducing the progression of  atherosclerotic disease.  We will plan statin therapy with stent placement going forward.  Would recommend this for at least 90 days.   Essential hypertension, benign blood pressure control important in reducing the progression of atherosclerotic disease. On appropriate oral medications.   Cancer of nasal cavity and sinus Status post radiation several years ago which is likely the cause of her current carotid stenosis in combination with some atherosclerotic disease.  Carotid stenosis A recent CT scan which I have independently reviewed showed progression of this to now what appeared to be a hemodynamically significant carotid stenosis.  This was not  a CT angiogram and the degree of stenosis was very difficult to discern.  This prompted a duplex performed at the hospital which I have also reviewed.  This demonstrates 80 to 99% left ICA stenosis.  Only mild right carotid disease was seen. Given her previous radiation, open surgical therapy is far more concerning for wound complications, recurrence, and generally would be avoided unless absolutely necessary.  Although her CT scan does not show her aortic arch well, it would appear that her anatomy is suitable at least in the cervical portion for carotid stenting.  Carotid stenting would clearly be the preferred option for a patient with radiation-induced stenosis.  I have went ahead and started her on Plavix reparation for left carotid stent placement in the next 1 to 2 weeks.  I had a long discussion with her today about the risks and benefits of the procedure.  I discussed the reason and rationale for treatment and reduction of stroke risk.  She is agreeable to proceed.      [name] 06/18/2020, 9:53 AM   This note was created with Dragon medical transcription system.  Any errors from dictation are unintentional.

## 2020-06-19 ENCOUNTER — Telehealth (INDEPENDENT_AMBULATORY_CARE_PROVIDER_SITE_OTHER): Payer: Self-pay

## 2020-06-19 DIAGNOSIS — J320 Chronic maxillary sinusitis: Secondary | ICD-10-CM | POA: Diagnosis not present

## 2020-06-19 DIAGNOSIS — H6122 Impacted cerumen, left ear: Secondary | ICD-10-CM | POA: Diagnosis not present

## 2020-06-19 DIAGNOSIS — M26609 Unspecified temporomandibular joint disorder, unspecified side: Secondary | ICD-10-CM | POA: Diagnosis not present

## 2020-06-19 NOTE — Telephone Encounter (Signed)
I attempted to contact the patient and a message was left for a return call. 

## 2020-06-19 NOTE — Telephone Encounter (Signed)
Patient returned my call and is scheduled with Dr. Lucky Cowboy for a left  Carotid stent placement on 06/27/20 with a 6:45 am arrival time to the [facility]. Covid testing on 06/25/20 between 8-1 pm at the [facility]. Pre-procedure instructions were discussed and will be mailed.

## 2020-06-24 NOTE — Telephone Encounter (Signed)
Patient called about stopping her Plavix and Aspirin before her carotid stent on 06/27/20 and was advised that she could continue these medications.

## 2020-06-25 ENCOUNTER — Other Ambulatory Visit: Payer: Self-pay

## 2020-06-25 ENCOUNTER — Other Ambulatory Visit
Admission: RE | Admit: 2020-06-25 | Discharge: 2020-06-25 | Disposition: A | Discharge status: Home / Self Care | Payer: Medicare PPO | Source: Ambulatory Visit | Attending: Vascular Surgery | Admitting: Vascular Surgery

## 2020-06-25 DIAGNOSIS — Z90710 Acquired absence of both cervix and uterus: Secondary | ICD-10-CM | POA: Diagnosis not present

## 2020-06-25 DIAGNOSIS — Z833 Family history of diabetes mellitus: Secondary | ICD-10-CM | POA: Diagnosis not present

## 2020-06-25 DIAGNOSIS — Z800 Family history of malignant neoplasm of digestive organs: Secondary | ICD-10-CM | POA: Diagnosis not present

## 2020-06-25 DIAGNOSIS — Z7982 Long term (current) use of aspirin: Secondary | ICD-10-CM | POA: Diagnosis not present

## 2020-06-25 DIAGNOSIS — Z85820 Personal history of malignant melanoma of skin: Secondary | ICD-10-CM | POA: Diagnosis not present

## 2020-06-25 DIAGNOSIS — Z8249 Family history of ischemic heart disease and other diseases of the circulatory system: Secondary | ICD-10-CM | POA: Diagnosis not present

## 2020-06-25 DIAGNOSIS — Z01812 Encounter for preprocedural laboratory examination: Secondary | ICD-10-CM | POA: Insufficient documentation

## 2020-06-25 DIAGNOSIS — Z902 Acquired absence of lung [part of]: Secondary | ICD-10-CM | POA: Diagnosis not present

## 2020-06-25 DIAGNOSIS — Z87442 Personal history of urinary calculi: Secondary | ICD-10-CM | POA: Diagnosis not present

## 2020-06-25 DIAGNOSIS — Z981 Arthrodesis status: Secondary | ICD-10-CM | POA: Diagnosis not present

## 2020-06-25 DIAGNOSIS — Z83438 Family history of other disorder of lipoprotein metabolism and other lipidemia: Secondary | ICD-10-CM | POA: Diagnosis not present

## 2020-06-25 DIAGNOSIS — Z803 Family history of malignant neoplasm of breast: Secondary | ICD-10-CM | POA: Diagnosis not present

## 2020-06-25 DIAGNOSIS — J301 Allergic rhinitis due to pollen: Secondary | ICD-10-CM | POA: Diagnosis not present

## 2020-06-25 DIAGNOSIS — J45909 Unspecified asthma, uncomplicated: Secondary | ICD-10-CM | POA: Diagnosis present

## 2020-06-25 DIAGNOSIS — I6522 Occlusion and stenosis of left carotid artery: Secondary | ICD-10-CM | POA: Diagnosis present

## 2020-06-25 DIAGNOSIS — Z8719 Personal history of other diseases of the digestive system: Secondary | ICD-10-CM | POA: Diagnosis not present

## 2020-06-25 DIAGNOSIS — K219 Gastro-esophageal reflux disease without esophagitis: Secondary | ICD-10-CM | POA: Diagnosis present

## 2020-06-25 DIAGNOSIS — Y842 Radiological procedure and radiotherapy as the cause of abnormal reaction of the patient, or of later complication, without mention of misadventure at the time of the procedure: Secondary | ICD-10-CM | POA: Diagnosis present

## 2020-06-25 DIAGNOSIS — I10 Essential (primary) hypertension: Secondary | ICD-10-CM | POA: Diagnosis present

## 2020-06-25 DIAGNOSIS — Z923 Personal history of irradiation: Secondary | ICD-10-CM | POA: Diagnosis not present

## 2020-06-25 DIAGNOSIS — Z79899 Other long term (current) drug therapy: Secondary | ICD-10-CM | POA: Diagnosis not present

## 2020-06-25 DIAGNOSIS — F17210 Nicotine dependence, cigarettes, uncomplicated: Secondary | ICD-10-CM | POA: Diagnosis present

## 2020-06-25 DIAGNOSIS — Z20822 Contact with and (suspected) exposure to covid-19: Secondary | ICD-10-CM | POA: Diagnosis present

## 2020-06-25 DIAGNOSIS — H93A2 Pulsatile tinnitus, left ear: Secondary | ICD-10-CM | POA: Diagnosis present

## 2020-06-25 DIAGNOSIS — E785 Hyperlipidemia, unspecified: Secondary | ICD-10-CM | POA: Diagnosis present

## 2020-06-25 DIAGNOSIS — Z823 Family history of stroke: Secondary | ICD-10-CM | POA: Diagnosis not present

## 2020-06-25 LAB — SARS CORONAVIRUS 2 (TAT 6-24 HRS): SARS Coronavirus 2: NEGATIVE

## 2020-06-26 ENCOUNTER — Other Ambulatory Visit (INDEPENDENT_AMBULATORY_CARE_PROVIDER_SITE_OTHER): Payer: Self-pay | Admitting: Nurse Practitioner

## 2020-06-27 ENCOUNTER — Encounter
Admission: RE | Disposition: A | Discharge status: Home / Self Care | Payer: Self-pay | Source: Home / Self Care | Attending: Vascular Surgery

## 2020-06-27 ENCOUNTER — Inpatient Hospital Stay
Admission: RE | Admit: 2020-06-27 | Discharge: 2020-06-28 | DRG: 036 | Disposition: A | Discharge status: Home / Self Care | Payer: Medicare PPO | Attending: Vascular Surgery | Admitting: Vascular Surgery

## 2020-06-27 ENCOUNTER — Other Ambulatory Visit: Payer: Self-pay

## 2020-06-27 ENCOUNTER — Encounter: Payer: Self-pay | Admitting: Vascular Surgery

## 2020-06-27 DIAGNOSIS — Z20822 Contact with and (suspected) exposure to covid-19: Secondary | ICD-10-CM | POA: Diagnosis present

## 2020-06-27 DIAGNOSIS — Z83438 Family history of other disorder of lipoprotein metabolism and other lipidemia: Secondary | ICD-10-CM | POA: Diagnosis not present

## 2020-06-27 DIAGNOSIS — Z800 Family history of malignant neoplasm of digestive organs: Secondary | ICD-10-CM

## 2020-06-27 DIAGNOSIS — F17210 Nicotine dependence, cigarettes, uncomplicated: Secondary | ICD-10-CM | POA: Diagnosis present

## 2020-06-27 DIAGNOSIS — Z902 Acquired absence of lung [part of]: Secondary | ICD-10-CM

## 2020-06-27 DIAGNOSIS — Z923 Personal history of irradiation: Secondary | ICD-10-CM

## 2020-06-27 DIAGNOSIS — K219 Gastro-esophageal reflux disease without esophagitis: Secondary | ICD-10-CM | POA: Diagnosis present

## 2020-06-27 DIAGNOSIS — Z833 Family history of diabetes mellitus: Secondary | ICD-10-CM | POA: Diagnosis not present

## 2020-06-27 DIAGNOSIS — I10 Essential (primary) hypertension: Secondary | ICD-10-CM | POA: Diagnosis present

## 2020-06-27 DIAGNOSIS — J45909 Unspecified asthma, uncomplicated: Secondary | ICD-10-CM | POA: Diagnosis present

## 2020-06-27 DIAGNOSIS — E785 Hyperlipidemia, unspecified: Secondary | ICD-10-CM | POA: Diagnosis present

## 2020-06-27 DIAGNOSIS — I6522 Occlusion and stenosis of left carotid artery: Secondary | ICD-10-CM | POA: Diagnosis present

## 2020-06-27 DIAGNOSIS — Z8249 Family history of ischemic heart disease and other diseases of the circulatory system: Secondary | ICD-10-CM

## 2020-06-27 DIAGNOSIS — Z90710 Acquired absence of both cervix and uterus: Secondary | ICD-10-CM | POA: Diagnosis not present

## 2020-06-27 DIAGNOSIS — H93A2 Pulsatile tinnitus, left ear: Secondary | ICD-10-CM | POA: Diagnosis present

## 2020-06-27 DIAGNOSIS — Z803 Family history of malignant neoplasm of breast: Secondary | ICD-10-CM

## 2020-06-27 DIAGNOSIS — Z87442 Personal history of urinary calculi: Secondary | ICD-10-CM

## 2020-06-27 DIAGNOSIS — Y842 Radiological procedure and radiotherapy as the cause of abnormal reaction of the patient, or of later complication, without mention of misadventure at the time of the procedure: Secondary | ICD-10-CM | POA: Diagnosis present

## 2020-06-27 DIAGNOSIS — Z79899 Other long term (current) drug therapy: Secondary | ICD-10-CM | POA: Diagnosis not present

## 2020-06-27 DIAGNOSIS — Z85820 Personal history of malignant melanoma of skin: Secondary | ICD-10-CM

## 2020-06-27 DIAGNOSIS — Z8719 Personal history of other diseases of the digestive system: Secondary | ICD-10-CM

## 2020-06-27 DIAGNOSIS — Z7982 Long term (current) use of aspirin: Secondary | ICD-10-CM | POA: Diagnosis not present

## 2020-06-27 DIAGNOSIS — Z823 Family history of stroke: Secondary | ICD-10-CM | POA: Diagnosis not present

## 2020-06-27 DIAGNOSIS — Z981 Arthrodesis status: Secondary | ICD-10-CM | POA: Diagnosis not present

## 2020-06-27 HISTORY — PX: CAROTID PTA/STENT INTERVENTION: CATH118231

## 2020-06-27 LAB — CREATININE, SERUM
Creatinine, Ser: 0.92 mg/dL (ref 0.44–1.00)
GFR calc Af Amer: >60 mL/min (ref >60)
GFR calc non Af Amer: >60 mL/min (ref >60)

## 2020-06-27 LAB — POCT ACTIVATED CLOTTING TIME: Activated Clotting Time: 313 seconds

## 2020-06-27 LAB — BUN: BUN: 16 mg/dL (ref 8–23)

## 2020-06-27 SURGERY — CAROTID PTA/STENT INTERVENTION
Anesthesia: Moderate Sedation | Laterality: Left

## 2020-06-27 MED ORDER — SODIUM CHLORIDE 0.9 % IV SOLN: INTRAVENOUS | Status: DC

## 2020-06-27 MED ORDER — ATORVASTATIN CALCIUM 20 MG PO TABS
20.0000 mg | ORAL_TABLET | Freq: Every day | ORAL | Status: DC
  Administered 2020-06-27 – 2020-06-28 (×2): 20 mg via ORAL
  Filled 2020-06-27 (×2): qty 1

## 2020-06-27 MED ORDER — ONDANSETRON HCL 4 MG/2ML IJ SOLN: 4.0000 mg | Freq: Four times a day (QID) | INTRAMUSCULAR | Status: DC | PRN

## 2020-06-27 MED ORDER — CLOPIDOGREL BISULFATE 75 MG PO TABS: 75.0000 mg | ORAL_TABLET | Freq: Every day | ORAL | Status: DC

## 2020-06-27 MED ORDER — HYDROMORPHONE HCL 1 MG/ML IJ SOLN: 1.0000 mg | Freq: Once | INTRAMUSCULAR | Status: DC | PRN

## 2020-06-27 MED ORDER — GUAIFENESIN-DM 100-10 MG/5ML PO SYRP
15.0000 mL | ORAL_SOLUTION | ORAL | Status: DC | PRN
  Filled 2020-06-27: qty 15

## 2020-06-27 MED ORDER — SODIUM CHLORIDE 0.9 % IV SOLN: 500.0000 mL | Freq: Once | INTRAVENOUS | Status: DC | PRN

## 2020-06-27 MED ORDER — ASPIRIN EC 81 MG PO TBEC
81.0000 mg | DELAYED_RELEASE_TABLET | Freq: Every day | ORAL | Status: DC
  Administered 2020-06-28: 81 mg via ORAL
  Filled 2020-06-27 (×2): qty 1

## 2020-06-27 MED ORDER — PANTOPRAZOLE SODIUM 40 MG PO TBEC
40.0000 mg | DELAYED_RELEASE_TABLET | Freq: Two times a day (BID) | ORAL | Status: DC
  Administered 2020-06-27 – 2020-06-28 (×2): 40 mg via ORAL
  Filled 2020-06-27 (×3): qty 1

## 2020-06-27 MED ORDER — ALUM & MAG HYDROXIDE-SIMETH 200-200-20 MG/5ML PO SUSP: 15.0000 mL | ORAL | Status: DC | PRN

## 2020-06-27 MED ORDER — ASPIRIN EC 81 MG PO TBEC: 81.0000 mg | DELAYED_RELEASE_TABLET | Freq: Every day | ORAL | Status: DC

## 2020-06-27 MED ORDER — DOPAMINE-DEXTROSE 3.2-5 MG/ML-% IV SOLN
INTRAVENOUS | Status: AC
  Filled 2020-06-27: qty 250

## 2020-06-27 MED ORDER — MIDAZOLAM HCL 2 MG/2ML IJ SOLN
INTRAMUSCULAR | Status: DC | PRN
  Administered 2020-06-27: 2 mg via INTRAVENOUS

## 2020-06-27 MED ORDER — OXYCODONE-ACETAMINOPHEN 5-325 MG PO TABS: 1.0000 | ORAL_TABLET | ORAL | Status: DC | PRN

## 2020-06-27 MED ORDER — CLINDAMYCIN PHOSPHATE 300 MG/50ML IV SOLN
INTRAVENOUS | Status: AC
  Administered 2020-06-27: 300 mg via INTRAVENOUS
  Filled 2020-06-27: qty 50

## 2020-06-27 MED ORDER — ACETAMINOPHEN 325 MG PO TABS: 325.0000 mg | ORAL_TABLET | ORAL | Status: DC | PRN

## 2020-06-27 MED ORDER — FAMOTIDINE 20 MG PO TABS: 40.0000 mg | ORAL_TABLET | Freq: Once | ORAL | Status: DC | PRN

## 2020-06-27 MED ORDER — HEPARIN SODIUM (PORCINE) 1000 UNIT/ML IJ SOLN
INTRAMUSCULAR | Status: AC
  Filled 2020-06-27: qty 1

## 2020-06-27 MED ORDER — CYCLOBENZAPRINE HCL 10 MG PO TABS
10.0000 mg | ORAL_TABLET | Freq: Three times a day (TID) | ORAL | Status: DC
  Administered 2020-06-27: 10 mg via ORAL
  Filled 2020-06-27 (×5): qty 1

## 2020-06-27 MED ORDER — MIDAZOLAM HCL 2 MG/ML PO SYRP: 8.0000 mg | ORAL_SOLUTION | Freq: Once | ORAL | Status: DC | PRN

## 2020-06-27 MED ORDER — MAGNESIUM SULFATE 2 GM/50ML IV SOLN
2.0000 g | Freq: Every day | INTRAVENOUS | Status: DC | PRN
  Filled 2020-06-27: qty 50

## 2020-06-27 MED ORDER — VANCOMYCIN HCL IN DEXTROSE 1-5 GM/200ML-% IV SOLN: 1000.0000 mg | Freq: Two times a day (BID) | INTRAVENOUS | Status: DC

## 2020-06-27 MED ORDER — COLESTIPOL HCL 1 G PO TABS
1.0000 g | ORAL_TABLET | Freq: Every day | ORAL | Status: DC
  Administered 2020-06-27: 1 g via ORAL
  Filled 2020-06-27 (×2): qty 1

## 2020-06-27 MED ORDER — MIDAZOLAM HCL 5 MG/5ML IJ SOLN
INTRAMUSCULAR | Status: AC
  Filled 2020-06-27: qty 5

## 2020-06-27 MED ORDER — ATROPINE SULFATE 1 MG/ML IJ SOLN
INTRAMUSCULAR | Status: DC | PRN
  Administered 2020-06-27: 1 mg via INTRAVENOUS

## 2020-06-27 MED ORDER — DIPHENHYDRAMINE HCL 50 MG/ML IJ SOLN: 50.0000 mg | Freq: Once | INTRAMUSCULAR | Status: DC | PRN

## 2020-06-27 MED ORDER — HYDRALAZINE HCL 20 MG/ML IJ SOLN: 5.0000 mg | INTRAMUSCULAR | Status: DC | PRN

## 2020-06-27 MED ORDER — METOPROLOL TARTRATE 5 MG/5ML IV SOLN: 2.0000 mg | INTRAVENOUS | Status: DC | PRN

## 2020-06-27 MED ORDER — METHYLPREDNISOLONE SODIUM SUCC 125 MG IJ SOLR: 125.0000 mg | Freq: Once | INTRAMUSCULAR | Status: DC | PRN

## 2020-06-27 MED ORDER — MORPHINE SULFATE (PF) 4 MG/ML IV SOLN: 2.0000 mg | INTRAVENOUS | Status: DC | PRN

## 2020-06-27 MED ORDER — LORATADINE 10 MG PO TABS
10.0000 mg | ORAL_TABLET | Freq: Every day | ORAL | Status: DC
  Administered 2020-06-27 – 2020-06-28 (×2): 10 mg via ORAL
  Filled 2020-06-27 (×2): qty 1

## 2020-06-27 MED ORDER — IODIXANOL 320 MG/ML IV SOLN
INTRAVENOUS | Status: DC | PRN
  Administered 2020-06-27: 65 mL via INTRA_ARTERIAL

## 2020-06-27 MED ORDER — FENTANYL CITRATE (PF) 100 MCG/2ML IJ SOLN
INTRAMUSCULAR | Status: DC | PRN
  Administered 2020-06-27: 50 ug via INTRAVENOUS

## 2020-06-27 MED ORDER — ATROPINE SULFATE 1 MG/10ML IJ SOSY
PREFILLED_SYRINGE | INTRAMUSCULAR | Status: AC
  Filled 2020-06-27: qty 20

## 2020-06-27 MED ORDER — ALBUTEROL SULFATE (2.5 MG/3ML) 0.083% IN NEBU: 3.0000 mL | INHALATION_SOLUTION | Freq: Four times a day (QID) | RESPIRATORY_TRACT | Status: DC | PRN

## 2020-06-27 MED ORDER — CLOPIDOGREL BISULFATE 75 MG PO TABS
75.0000 mg | ORAL_TABLET | Freq: Every day | ORAL | Status: DC
  Administered 2020-06-28: 75 mg via ORAL
  Filled 2020-06-27: qty 1

## 2020-06-27 MED ORDER — LABETALOL HCL 5 MG/ML IV SOLN: 10.0000 mg | INTRAVENOUS | Status: DC | PRN

## 2020-06-27 MED ORDER — FENTANYL CITRATE (PF) 100 MCG/2ML IJ SOLN
INTRAMUSCULAR | Status: AC
  Filled 2020-06-27: qty 2

## 2020-06-27 MED ORDER — FESOTERODINE FUMARATE ER 4 MG PO TB24
4.0000 mg | ORAL_TABLET | Freq: Every day | ORAL | Status: DC
  Administered 2020-06-27: 4 mg via ORAL
  Filled 2020-06-27 (×2): qty 1

## 2020-06-27 MED ORDER — POTASSIUM CHLORIDE CRYS ER 20 MEQ PO TBCR: 20.0000 meq | EXTENDED_RELEASE_TABLET | Freq: Every day | ORAL | Status: DC | PRN

## 2020-06-27 MED ORDER — PHENOL 1.4 % MT LIQD
1.0000 | OROMUCOSAL | Status: DC | PRN
  Filled 2020-06-27: qty 177

## 2020-06-27 MED ORDER — FAMOTIDINE IN NACL 20-0.9 MG/50ML-% IV SOLN
20.0000 mg | Freq: Two times a day (BID) | INTRAVENOUS | Status: DC
  Administered 2020-06-27: 20 mg via INTRAVENOUS
  Filled 2020-06-27 (×4): qty 50

## 2020-06-27 MED ORDER — ACETAMINOPHEN 325 MG RE SUPP
325.0000 mg | RECTAL | Status: DC | PRN
  Filled 2020-06-27: qty 2

## 2020-06-27 MED ORDER — PHENYLEPHRINE HCL (PRESSORS) 10 MG/ML IV SOLN
INTRAVENOUS | Status: AC
  Filled 2020-06-27: qty 1

## 2020-06-27 MED ORDER — CLINDAMYCIN PHOSPHATE 300 MG/50ML IV SOLN: 300.0000 mg | Freq: Once | INTRAVENOUS | Status: AC

## 2020-06-27 SURGICAL SUPPLY — 16 items
BALLN VTRAC 4.5X20X135 (BALLOONS) ×3
BALLOON VTRAC 4.5X20X135 (BALLOONS) ×1 IMPLANT
CATH ANGIO 5F 100CM .035 PIG (CATHETERS) ×3 IMPLANT
CATH BEACON 5 .035 100 H1 TIP (CATHETERS) ×3 IMPLANT
DEVICE EMBOSHIELD NAV6 4.0-7.0 (FILTER) ×3 IMPLANT
DEVICE PRESTO INFLATION (MISCELLANEOUS) ×3 IMPLANT
DEVICE STARCLOSE SE CLOSURE (Vascular Products) ×3 IMPLANT
DEVICE TORQUE .025-.038 (MISCELLANEOUS) ×3 IMPLANT
GLIDEWIRE ANGLED SS 035X260CM (WIRE) ×3 IMPLANT
KIT CAROTID MANIFOLD (MISCELLANEOUS) ×3 IMPLANT
PACK ANGIOGRAPHY (CUSTOM PROCEDURE TRAY) ×3 IMPLANT
SHEATH BRITE TIP 6FRX11 (SHEATH) ×3 IMPLANT
SHEATH SHUTTLE SELECT 6F (SHEATH) ×3 IMPLANT
STENT XACT CAR 9-7X40X136 (Permanent Stent) ×3 IMPLANT
WIRE G VAS 035X260 STIFF (WIRE) ×3 IMPLANT
WIRE J 3MM .035X145CM (WIRE) ×3 IMPLANT

## 2020-06-27 NOTE — Op Note (Signed)
OPERATIVE NOTE DATE: 06/27/2020  PROCEDURE: 1.  Ultrasound guidance for vascular access right femoral artery 2.  Placement of a 9 mm proximal, 7 mm distal, 4 cm long exact stent with the use of the NAV-6 embolic protection device in the left carotid artery  PRE-OPERATIVE DIAGNOSIS: 1.  High-grade, radiation-induced left carotid artery stenosis. 2.  Pulsatile tinnitus  POST-OPERATIVE DIAGNOSIS:  Same as above  SURGEON: [name], MD  ASSISTANT(S): None  ANESTHESIA: local/MCS  ESTIMATED BLOOD LOSS: 10 cc cc  CONTRAST: 65 cc  FLUORO TIME: 4.5 minutes  MODERATE CONSCIOUS SEDATION TIME:  Approximately 37 minutes using 2 mg of Versed and 50 mcg of Fentanyl  FINDING(S): 1.   95% left carotid artery stenosis  SPECIMEN(S):   none  INDICATIONS:   Patient is a 64 y.o. female who presents with a high-grade radiation-induced left carotid artery stenosis confirmed by both duplex and a previous CT of the neck.  The patient has had a previous cervical fusion as well as neck radiation making surgery essentially impossible or highly morbid and carotid artery stenting was felt to be preferred to endarterectomy for that reason.  Risks and benefits were discussed and informed consent was obtained.   DESCRIPTION: After obtaining full informed written consent, the patient was brought back to the vascular suite and placed supine upon the table.  The patient received IV antibiotics prior to induction. Moderate conscious sedation was administered during a face to face encounter with the patient throughout the procedure with my supervision of the RN administering medicines and monitoring the patients vital signs and mental status throughout from the start of the procedure until the patient was taken to the recovery room.  After obtaining adequate anesthesia, the patient was prepped and draped in the standard fashion.   The right femoral artery was visualized with ultrasound and found to be widely patent.  It was then accessed under direct ultrasound guidance without difficulty with a Seldinger needle. A permanent image was recorded. A J-wire was placed and we then placed a 6 French sheath. The patient was then heparinized and a total of 7000 units of intravenous heparin were given and an ACT was checked to confirm successful anticoagulation. A pigtail catheter was then placed into the ascending aorta. This showed relatively normal origins of the great vessels with a type II aortic arch. I then selectively cannulated the left common carotid artery without difficulty with a headhunter catheter and advanced into the mid left common carotid artery.  Cervical and cerebral carotid angiography was then performed. There were no obvious intracranial filling defects with very poor filling of the anterior cerebral artery but reasonably good filling of the middle cerebral artery. The carotid bifurcation demonstrated a lesion about 1-1/2 to 2 cm distal to the bifurcation which was extremely narrowed in the 95% range.  There was only a very small amount of calcification and plaque, and this appeared radiation-induced.  I then advanced into the external carotid artery with a Glidewire and the headhunter catheter and then exchanged for the Amplatz Super Stiff wire. Over the Amplatz Super Stiff wire, a 6 [place] shuttle sheath was placed into the mid common carotid artery. I then used the NAV-6  Embolic protection device and crossed the lesion and parked this in the distal internal carotid artery at the base of the skull.  I then selected a 9 mm proximal, 7 mm distal, 4 cm long exact stent. This was deployed across the lesion encompassing it in its entirety.  The proximal portion was just back into the common carotid artery.  A 4.5 mm diameter by 2 cm length balloon was used to post dilate the stent. Only about a 10-15% residual stenosis was present after angioplasty.  The embolic protection device was removed.  There was some degree  of spasm at the site of the embolic protection device following treatment.  Completion angiogram showed normal intracranial filling without new defects and slight improvement of the anterior cerebral artery. At this point I elected to terminate the procedure. The sheath was removed and StarClose closure device was deployed in the right femoral artery with excellent hemostatic result. The patient was taken to the recovery room in stable condition having tolerated the procedure well.  COMPLICATIONS: none  CONDITION: stable  [name] 06/27/2020 9:20 AM   This note was created with Dragon Medical transcription system. Any errors in dictation are purely unintentional.

## 2020-06-27 NOTE — Interval H&P Note (Signed)
History and Physical Interval Note:  06/27/2020 8:09 AM  Heather Beasley  has presented today for surgery, with the diagnosis of LT Carotid Stent Placement   Carotid Artery Stenosis   ABBOTT Rep cc: M Godley, S Willey   Pt to have Covid test on 06-25-20.  The various methods of treatment have been discussed with the patient and family. After consideration of risks, benefits and other options for treatment, the patient has consented to  Procedure(s): CAROTID PTA/STENT INTERVENTION (Left) as a surgical intervention.  The patient's history has been reviewed, patient examined, no change in status, stable for surgery.  I have reviewed the patient's chart and labs.  Questions were answered to the patient's satisfaction.     [name]

## 2020-06-27 NOTE — Progress Notes (Signed)
Patient's right groin site checked and noted to be bleeding slightly at 1355. PAD removed and began holding pressure. Dr. Lucky Cowboy notified and came to bedside to evaluate site at 1430. He advised to inject site with lidocaine and epinephrine then replace PAD and have patient lay flat for an additional hour. Pressure held until 1435 when vascular lab staff at bedside for lidocaine and epinephrine injection. Patient reports tenderness at groin site but no pain. Vital signs stable. Will continue to monitor.

## 2020-06-28 ENCOUNTER — Encounter: Payer: Self-pay | Admitting: Vascular Surgery

## 2020-06-28 DIAGNOSIS — J301 Allergic rhinitis due to pollen: Secondary | ICD-10-CM | POA: Diagnosis not present

## 2020-06-28 DIAGNOSIS — I6522 Occlusion and stenosis of left carotid artery: Principal | ICD-10-CM

## 2020-06-28 LAB — BASIC METABOLIC PANEL
Anion gap: 5 (ref 5–15)
BUN: 20 mg/dL (ref 8–23)
CO2: 26 mmol/L (ref 22–32)
Calcium: 8.4 mg/dL — ABNORMAL LOW (ref 8.9–10.3)
Chloride: 110 mmol/L (ref 98–111)
Creatinine, Ser: 0.93 mg/dL (ref 0.44–1.00)
GFR calc Af Amer: >60 mL/min (ref >60)
GFR calc non Af Amer: >60 mL/min (ref >60)
Glucose, Bld: 99 mg/dL (ref 70–99)
Potassium: 4.2 mmol/L (ref 3.5–5.1)
Sodium: 141 mmol/L (ref 135–145)

## 2020-06-28 LAB — CBC
HCT: 37.8 % (ref 36.0–46.0)
Hemoglobin: 12.2 g/dL (ref 12.0–15.0)
MCH: 31.4 pg (ref 26.0–34.0)
MCHC: 32.3 g/dL (ref 30.0–36.0)
MCV: 97.2 fL (ref 80.0–100.0)
Platelets: 180 10*3/uL (ref 150–400)
RBC: 3.89 MIL/uL (ref 3.87–5.11)
RDW: 13.7 % (ref 11.5–15.5)
WBC: 4.4 10*3/uL (ref 4.0–10.5)
nRBC: 0 % (ref 0.0–0.2)

## 2020-06-28 MED ORDER — ATORVASTATIN CALCIUM 20 MG PO TABS
20.0000 mg | ORAL_TABLET | Freq: Every day | ORAL | 0 refills | Status: DC
Start: 2020-06-28 — End: 2020-09-16

## 2020-06-28 NOTE — Discharge Instructions (Signed)
Vascular Surgery Discharge Instructions:  1) You may shower.  Please keep your groins clean and dry. 2) Please do not engage in any strenuous activity or lifting heavier than 10 pounds for 2 weeks.       Carotid Artery Disease  Carotid artery disease is the narrowing or blockage of one or both carotid arteries. This condition is also called carotid artery stenosis. The carotid arteries are the two main blood vessels on either side of the neck. They send blood to the brain, other parts of the head, and the neck.  This condition increases your risk for a stroke or a transient ischemic attack (TIA). A TIA is a "mini-stroke" that causes stroke-like symptoms that go away quickly. What are the causes? This condition is mainly caused by a narrowing and hardening of the carotid arteries. The carotid arteries can become narrow or clogged with a buildup of plaque. Plaque includes:  Fat.  Cholesterol.  Calcium.  Other substances. What increases the risk? The following factors may make you more likely to develop this condition:  Having certain medical conditions, such as: ? High cholesterol. ? High blood pressure. ? Diabetes. ? Obesity.  Smoking.  A family history of cardiovascular disease.  Not being active or lack of regular exercise.  Being female. Men have a higher risk of having arteries become narrow and harden earlier in life than women.  Old age. What are the signs or symptoms? This condition may not have any signs or symptoms until a stroke or TIA happens. In some cases, your doctor may be able to hear a whooshing sound. This can suggest a change in blood flow caused by plaque buildup. An eye exam can also help find signs of the condition. How is this treated? This condition may be treated with more than one treatment. Treatment options include:  Lifestyle changes, such as: ? Quitting smoking. ? Getting regular exercise, or getting exercise as told by your  doctor. ? Eating a healthy diet. ? Managing stress. ? Keeping a healthy weight.  Medicines to control: ? Blood pressure. ? Cholesterol. ? Blood clotting.  Surgery. You may have: ? A surgery to remove the blockages in the carotid arteries. ? A procedure in which a small mesh tube (stent) is used to widen the blocked carotid arteries. Follow these instructions at home: Eating and drinking Follow instructions about your diet from your doctor. It is important to follow a healthy diet.  Eat a diet that includes: ? A lot of fresh fruits and vegetables. ? Low-fat (lean) meats.  Avoid these foods: ? Foods that are high in fat. ? Foods that are high in salt (sodium). ? Foods that are fried. ? Foods that are processed. ? Foods that have few good nutrients (poor nutritional value).  Lifestyle   Keep a healthy weight.  Do exercises as told by your doctor to stay active. Each week, you should get one of the following: ? At least 150 minutes of exercise that raises your heart rate and makes you sweat (moderate-intensity exercise). ? At least 75 minutes of exercise that takes a lot of effort.  Do not use any products that contain nicotine or tobacco, such as cigarettes, e-cigarettes, and chewing tobacco. If you need help quitting, ask your doctor.  Do not drink alcohol if: ? Your doctor tells you not to drink. ? You are pregnant, may be pregnant, or are planning to become pregnant.  If you drink alcohol: ? Limit how much you use to:  0-1 drink a day for women.  0-2 drinks a day for men. ? Be aware of how much alcohol is in your drink. In the [place] one drink equals one 12 oz bottle of beer (355 mL), one 5 oz glass of wine (148 mL), or one 1 oz glass of hard liquor (44 mL).  Do not use drugs.  Manage your stress. Ask your doctor for tips on how to do this. General instructions  Take over-the-counter and prescription medicines only as told by your doctor.  Keep all follow-up  visits as told by your doctor. This is important. Where to find more information  American Heart Association: www.heart.org Get help right away if:  You have any signs of a stroke. "BE FAST" is an easy way to remember the main warning signs: ? B - Balance. Signs are dizziness, sudden trouble walking, or loss of balance. ? E - Eyes. Signs are trouble seeing or a change in how you see. ? F - Face. Signs are sudden weakness or loss of feeling of the face, or the face or eyelid drooping on one side. ? A - Arms. Signs are weakness or loss of feeling in an arm. This happens suddenly and usually on one side of the body. ? S - Speech. Signs are sudden trouble speaking, slurred speech, or trouble understanding what people say. ? T - Time. Time to call emergency services. Write down what time symptoms started.  You have other signs of a stroke, such as: ? A sudden, very bad headache with no known cause. ? Feeling like you may vomit (nausea). ? Vomiting. ? A seizure. These symptoms may be an emergency. Do not wait to see if the symptoms will go away. Get medical help right away. Call your local emergency services (911 in the [place] Do not drive yourself to the hospital. Summary  The carotid arteries are blood vessels on both sides of the neck.  If these arteries get smaller or get blocked, you are more likely to have a stroke or a mini-stroke.  This condition can be treated with lifestyle changes, medicines, surgery, or a blend of these treatments.  Get help right away if you have any signs of a stroke. "BE FAST" is an easy way to remember the main warning signs of stroke. This information is not intended to replace advice given to you by your health care provider. Make sure you discuss any questions you have with your health care provider. Document Revised: 05/08/2019 Document Reviewed: 05/08/2019 Elsevier Patient Education  [employer].

## 2020-06-28 NOTE — Discharge Summary (Signed)
[facility]    Discharge Summary  Patient ID:  Heather Beasley MRN: [id number] DOB/AGE: 1956-06-25 64 y.o.  Admit date: 06/27/2020 Discharge date: 06/28/2020 Date of Surgery: 06/27/2020 Surgeon: Surgeon(s): Algernon Huxley, MD  Admission Diagnosis: Carotid stenosis, left [I65.22]  Discharge Diagnoses:  Carotid stenosis, left [I65.22]  Secondary Diagnoses: Past Medical History:  Diagnosis Date  . Allergic rhinitis   . Arthritis   . Asthma   . Cancer of nasal cavity and sinus ([place]) 07/07/2015   radiation  . Cervical spondylosis   . Colitis   . Colon polyps   . Depression   . Diverticulosis   . GERD (gastroesophageal reflux disease)   . Glaucoma   . Headaches, cluster   . Hyperlipidemia   . IBS (irritable bowel syndrome)   . Kidney stones   . Melanoma ([place])   . Neuropathy   . PONV (postoperative nausea and vomiting)   . Tobacco abuse    Procedure(s): (06/27/20) 1.  Ultrasound guidance for vascular access right femoral artery 2.  Placement of a 9 mm proximal, 7 mm distal, 4 cm long exact stent with the use of the NAV-6 embolic protection device in the left carotid artery  Discharged Condition: Good  HPI / Hospital Course:  Patient is a 64 year old female who presents with a high-grade radiation-induced left carotid artery stenosis confirmed by both duplex and a previous CT of the neck.  The patient has had a previous cervical fusion as well as neck radiation making surgery essentially impossible or highly morbid and carotid artery stenting was felt to be preferred to endarterectomy for that reason.  Risks and benefits were discussed and informed consent was obtained.  On June 28, 2019, the patient underwent:  1.  Ultrasound guidance for vascular access right femoral artery 2.  Placement of a 9 mm proximal, 7 mm distal, 4 cm long exact stent with the use of the NAV-6 embolic protection device in the left carotid artery  The patient tolerated the  procedure and was transferred from the operating room to the recovery room overnight. Night of surgery unremarkable. During the patient's brief stay, her diet was advanced, she was urinating independently, her discomfort was controlled with the use of p.o. pain medication and she was ambulating at baseline. Day of discharge, the patient was afebrile with stable vital signs and essentially unremarkable physical exam  Physical exam: Alert and oriented x3, no acute distress Face: Symmetrical, tongue midline Neck: Trachea midline, no swelling noted Cardiovascular: Regular rate and rhythm Pulmonary: Clear to auscultation bilaterally Abdomen: Soft, nontender, nondistended positive bowel sounds Right groin:  PAD removed. Some ecchymosis noted.  However access site is clean and dry. Extremity: Warm distally to toes Neurological: Upper/lower 5 out of 5.  No deficits noted on exam  Labs: As below  Complications: None  Consults: None  Significant Diagnostic Studies: CBC Lab Results  Component Value Date   WBC 4.4 06/28/2020   HGB 12.2 06/28/2020   HCT 37.8 06/28/2020   MCV 97.2 06/28/2020   PLT 180 06/28/2020   BMET    Component Value Date/Time   NA 141 06/28/2020 0406   NA 138 04/10/2019 1439   K 4.2 06/28/2020 0406   CL 110 06/28/2020 0406   CO2 26 06/28/2020 0406   GLUCOSE 99 06/28/2020 0406   BUN 20 06/28/2020 0406   BUN 16 04/10/2019 1439   CREATININE 0.93 06/28/2020 0406   CALCIUM 8.4 (L) 06/28/2020 0406  GFRNONAA >60 06/28/2020 0406   GFRAA >60 06/28/2020 0406   COAG Lab Results  Component Value Date   INR 1.03 08/04/2013   INR 1.23 06/10/2011   Disposition:  Discharge to :Home  Allergies as of 06/28/2020      Reactions   Albumin (human) Rash   Amoxicillin-pot Clavulanate Hives   Gabapentin Shortness Of Breath   Latex Dermatitis, Rash   Betadine [povidone Iodine] Dermatitis, Rash, Other (See Comments)   Skin burning Topical betadine and iodine have this  reaction with patient.  IV contrast is NOT a problem.  jkl   Sucralfate    Other reaction(s): Other (See Comments) Abdominal Pain   Amoxicillin    REACTION: rash   Chantix  [varenicline Tartrate]    Chantix [varenicline Tartrate]    Heart racing   Erythromycin    REACTION: rash   Sucralfate Nausea And Vomiting   Heart racing Lightheaded      Medication List    TAKE these medications   aspirin EC 81 MG tablet Take 81 mg by mouth daily.   atorvastatin 20 MG tablet Commonly known as: LIPITOR Take 1 tablet (20 mg total) by mouth daily.   CENTRUM ADULTS PO Take 1 tablet by mouth.   cetirizine 10 MG tablet Commonly known as: ZYRTEC Take 10 mg by mouth daily. Allergies.   clopidogrel 75 MG tablet Commonly known as: PLAVIX Take 1 tablet (75 mg total) by mouth daily.   colestipol 1 g tablet Commonly known as: COLESTID Take by mouth.   cyclobenzaprine 10 MG tablet Commonly known as: FLEXERIL Take 1 tablet (10 mg total) by mouth 3 (three) times daily.   EPINEPHrine 0.3 mg/0.3 mL Soaj injection Commonly known as: EPI-PEN Inject 1 mg into the skin as needed.   fesoterodine 4 MG Tb24 tablet Commonly known as: Toviaz Take 1 tablet (4 mg total) by mouth daily.   loperamide 2 MG tablet Commonly known as: IMODIUM A-D Take 2 mg by mouth 2 (two) times a day.   nystatin powder Commonly known as: MYCOSTATIN/NYSTOP Apply 1 application topically 3 (three) times daily.   pantoprazole 40 MG tablet Commonly known as: PROTONIX Take 1 tablet (40 mg total) by mouth 2 (two) times daily.   Ventolin HFA 108 (90 Base) MCG/ACT inhaler Generic drug: albuterol INHALE 2 PUFFS INTO THE LUNGS EVERY 6 HOURS AS NEEDED FOR WHEEZING ORSHORTNESS OF BREATH      Verbal and written Discharge instructions given to the patient. Wound care per Discharge AVS  Follow-up Information    Kris Hartmann, NP Follow up in 1 month(s).   Specialty: Vascular Surgery Why: First post-op visit. Will need  carotid with visit.  Contact information: [place] [address] [phone]              Signed: Sela Hua, PA-C  06/28/2020, 9:57 AM

## 2020-07-10 DIAGNOSIS — J301 Allergic rhinitis due to pollen: Secondary | ICD-10-CM | POA: Diagnosis not present

## 2020-07-17 DIAGNOSIS — J301 Allergic rhinitis due to pollen: Secondary | ICD-10-CM | POA: Diagnosis not present

## 2020-07-24 ENCOUNTER — Other Ambulatory Visit (INDEPENDENT_AMBULATORY_CARE_PROVIDER_SITE_OTHER): Payer: Self-pay | Admitting: Vascular Surgery

## 2020-07-24 DIAGNOSIS — J301 Allergic rhinitis due to pollen: Secondary | ICD-10-CM | POA: Diagnosis not present

## 2020-07-24 DIAGNOSIS — I6522 Occlusion and stenosis of left carotid artery: Secondary | ICD-10-CM

## 2020-07-24 DIAGNOSIS — Z95820 Peripheral vascular angioplasty status with implants and grafts: Secondary | ICD-10-CM

## 2020-07-30 ENCOUNTER — Ambulatory Visit (INDEPENDENT_AMBULATORY_CARE_PROVIDER_SITE_OTHER): Payer: Medicare PPO | Admitting: Nurse Practitioner

## 2020-07-30 ENCOUNTER — Ambulatory Visit (INDEPENDENT_AMBULATORY_CARE_PROVIDER_SITE_OTHER): Payer: Medicare PPO

## 2020-07-30 ENCOUNTER — Other Ambulatory Visit: Payer: Self-pay

## 2020-07-30 ENCOUNTER — Encounter (INDEPENDENT_AMBULATORY_CARE_PROVIDER_SITE_OTHER): Payer: Self-pay | Admitting: Nurse Practitioner

## 2020-07-30 VITALS — BP 131/81 | HR 80 | Resp 16 | Wt 181.8 lb

## 2020-07-30 DIAGNOSIS — I10 Essential (primary) hypertension: Secondary | ICD-10-CM

## 2020-07-30 DIAGNOSIS — I6522 Occlusion and stenosis of left carotid artery: Secondary | ICD-10-CM | POA: Diagnosis not present

## 2020-07-30 DIAGNOSIS — I6523 Occlusion and stenosis of bilateral carotid arteries: Secondary | ICD-10-CM

## 2020-07-30 DIAGNOSIS — F17200 Nicotine dependence, unspecified, uncomplicated: Secondary | ICD-10-CM

## 2020-07-30 DIAGNOSIS — Z95820 Peripheral vascular angioplasty status with implants and grafts: Secondary | ICD-10-CM | POA: Diagnosis not present

## 2020-07-30 DIAGNOSIS — E7800 Pure hypercholesterolemia, unspecified: Secondary | ICD-10-CM

## 2020-07-30 NOTE — Progress Notes (Signed)
Subjective:    Patient ID: Heather Beasley, female    DOB: 1955/12/21, 64 y.o.   MRN: [id number] Chief Complaint  Patient presents with  . Follow-up    [facility] 25month carotid    The patient is seen for follow up evaluation of carotid stenosis status post left internal carotid artery stent on 06/27/2020.  Previously the patient had 95% stenosis resulting from radiation of the left ICA.  There were no post operative problems or complications related to the surgery.  The patient denies significant neck or incisional pain.  The patient denies interval amaurosis fugax. There is no recent history of TIA symptoms or focal motor deficits. There is no prior documented CVA.  The patient denies headache.  The patient is taking enteric-coated aspirin 81 mg daily.  The patient has a history of coronary artery disease, no recent episodes of angina or shortness of breath. The patient denies PAD or claudication symptoms. There is a history of hyperlipidemia which is being treated with a statin.   Today noninvasive studies show a patent stent in the left ICA.  There is a 1 to 39% stenosis bilaterally in the internal carotid arteries.   Review of Systems  Hematological: Bruises/bleeds easily.  All other systems reviewed and are negative.      Objective:   Physical Exam Vitals reviewed.  HENT:     Head: Normocephalic.  Cardiovascular:     Rate and Rhythm: Normal rate and regular rhythm.     Pulses: Normal pulses.  Pulmonary:     Effort: Pulmonary effort is normal.  Skin:    General: Skin is warm and dry.  Neurological:     Mental Status: She is alert and oriented to person, place, and time.  Psychiatric:        Mood and Affect: Mood normal.        Behavior: Behavior normal.        Thought Content: Thought content normal.        Judgment: Judgment normal.     BP 131/81 (BP Location: Right Arm)   Pulse 80   Resp 16   Wt 181 lb 12.8 oz (82.5 kg)   BMI 30.25 kg/m   Past Medical  History:  Diagnosis Date  . Allergic rhinitis   . Arthritis   . Asthma   . Cancer of nasal cavity and sinus ([place]) 07/07/2015   radiation  . Cervical spondylosis   . Colitis   . Colon polyps   . Depression   . Diverticulosis   . GERD (gastroesophageal reflux disease)   . Glaucoma   . Headaches, cluster   . Hyperlipidemia   . IBS (irritable bowel syndrome)   . Kidney stones   . Melanoma ([place])   . Neuropathy   . PONV (postoperative nausea and vomiting)   . Tobacco abuse     Social History   Socioeconomic History  . Marital status: Married    Spouse name: Not on file  . Number of children: 2  . Years of education: Not on file  . Highest education level: Not on file  Occupational History  . Occupation: Scientist, water quality  Tobacco Use  . Smoking status: Current Every Day Smoker    Packs/day: 0.25    Years: 39.00    Pack years: 9.75    Types: Cigarettes  . Smokeless tobacco: Never Used  . Tobacco comment: e cigs currently--trying to quit  Substance and Sexual Activity  . Alcohol use: No  . Drug  use: No  . Sexual activity: Not Currently    Partners: Male  Other Topics Concern  . Not on file  Social History Narrative   Lives in [place] with husband. Dog in home. Work - disabled for neck and back pain.   Social Determinants of Health   Financial Resource Strain:   . Difficulty of Paying Living Expenses: Not on file  Food Insecurity:   . Worried About Charity fundraiser in the Last Year: Not on file  . Ran Out of Food in the Last Year: Not on file  Transportation Needs:   . Lack of Transportation (Medical): Not on file  . Lack of Transportation (Non-Medical): Not on file  Physical Activity:   . Days of Exercise per Week: Not on file  . Minutes of Exercise per Session: Not on file  Stress:   . Feeling of Stress : Not on file  Social Connections:   . Frequency of Communication with Friends and Family: Not on file  . Frequency of Social Gatherings with Friends and  Family: Not on file  . Attends Religious Services: Not on file  . Active Member of Clubs or Organizations: Not on file  . Attends Archivist Meetings: Not on file  . Marital Status: Not on file  Intimate Partner Violence:   . Fear of Current or Ex-Partner: Not on file  . Emotionally Abused: Not on file  . Physically Abused: Not on file  . Sexually Abused: Not on file    Past Surgical History:  Procedure Laterality Date  . ABDOMINAL HYSTERECTOMY  1998  . APPENDECTOMY  1998  . ARM FX REPAIR  2001  . BACK SURGERY    . CAROTID PTA/STENT INTERVENTION Left 06/27/2020   Procedure: CAROTID PTA/STENT INTERVENTION;  Surgeon: Algernon Huxley, MD;  Location: [facility] [facility];  Service: Cardiovascular;  Laterality: Left;  . CERVICAL FUSION  2010  . CHOLECYSTECTOMY  08/2011   Dr Rochel Brome  . COLONOSCOPY     Dr Tiffany Kocher  . CYSTOSCOPY     Dr. Bernardo Heater  . DILATION AND CURETTAGE OF UTERUS    . ESOPHAGOGASTRODUODENOSCOPY  2012   Dr Tiffany Kocher  . FRACTURE SURGERY  2001   plate in right wrist and arm  . IMAGE GUIDED SINUS SURGERY N/A 06/25/2015   Procedure: IMAGE GUIDED SINUS SURGERY;  Surgeon: Beverly Gust, MD;  Location: [facility];  Service: ENT;  Laterality: N/A;  . LUMBAR SPINE SURGERY  07/2004  . OOPHORECTOMY    . POLYPECTOMY N/A 06/25/2015   Procedure: POLYPECTOMY NASAL;  Surgeon: Beverly Gust, MD;  Location: [facility];  Service: ENT;  Laterality: N/A;  . [place]  . VAGINAL DELIVERY     x2    Family History  Problem Relation Age of Onset  . Hypertension Mother   . Hyperlipidemia Mother   . Diabetes Mother   . Colon cancer Father   . Pancreatic cancer Father   . Cancer Father        Colon & Pancreatic  . Breast cancer Paternal Grandmother 47  . Diabetes Paternal Grandmother   . Coronary artery disease Paternal Grandmother   . Heart failure Paternal Grandmother   . Cancer Paternal Grandmother        breast  . Stroke Brother     Allergies  Allergen  Reactions  . Albumin (Human) Rash  . Amoxicillin-Pot Clavulanate Hives  . Gabapentin Shortness Of Breath  . Latex Dermatitis and Rash  .  Betadine [Povidone Iodine] Dermatitis, Rash and Other (See Comments)    Skin burning Topical betadine and iodine have this reaction with patient.  IV contrast is NOT a problem.  jkl  . Sucralfate     Other reaction(s): Other (See Comments) Abdominal Pain  . Amoxicillin     REACTION: rash  . Chantix  [Varenicline Tartrate]   . Chantix [Varenicline Tartrate]     Heart racing  . Erythromycin     REACTION: rash  . Sucralfate Nausea And Vomiting    Heart racing Lightheaded        Assessment & Plan:   1. Bilateral carotid artery stenosis Recommend:  The patient is s/p successful left carotid stent  Duplex ultrasound preoperatively shows 1 to 39% stenosis bilaterally Continue antiplatelet therapy as prescribed Continue management of CAD, HTN and Hyperlipidemia Healthy heart diet,  encouraged exercise at least 4 times per week  Follow up in 3 months with duplex ultrasound and physical exam based on the patient's carotid stenting.   2. Pure hypercholesterolemia Continue statin as ordered and reviewed, no changes at this time   3. Essential hypertension, benign Continue antihypertensive medications as already ordered, these medications have been reviewed and there are no changes at this time.   4. Tobacco use disorder Smoking cessation was discussed, 3-10 minutes spent on this topic specifically    Current Outpatient Medications on File Prior to Visit  Medication Sig Dispense Refill  . aspirin EC 81 MG tablet Take 81 mg by mouth daily.    Marland Kitchen atorvastatin (LIPITOR) 20 MG tablet Take 1 tablet (20 mg total) by mouth daily. 90 tablet 0  . cetirizine (ZYRTEC) 10 MG tablet Take 10 mg by mouth daily. Allergies.    Marland Kitchen clopidogrel (PLAVIX) 75 MG tablet Take 1 tablet (75 mg total) by mouth daily. 30 tablet 6  . colestipol (COLESTID) 1 g tablet  Take by mouth.    . cyclobenzaprine (FLEXERIL) 10 MG tablet Take 1 tablet (10 mg total) by mouth 3 (three) times daily. 90 tablet 6  . EPINEPHrine 0.3 mg/0.3 mL IJ SOAJ injection Inject 1 mg into the skin as needed.    . fesoterodine (TOVIAZ) 4 MG TB24 tablet Take 1 tablet (4 mg total) by mouth daily. 90 tablet 3  . loperamide (IMODIUM A-D) 2 MG tablet Take 2 mg by mouth 2 (two) times a day.     . Multiple Vitamins-Minerals (CENTRUM ADULTS PO) Take 1 tablet by mouth.    . nystatin (MYCOSTATIN/NYSTOP) powder Apply 1 application topically 3 (three) times daily. 15 g 0  . pantoprazole (PROTONIX) 40 MG tablet Take 1 tablet (40 mg total) by mouth 2 (two) times daily. 60 tablet 11  . VENTOLIN HFA 108 (90 Base) MCG/ACT inhaler INHALE 2 PUFFS INTO THE LUNGS EVERY 6 HOURS AS NEEDED FOR WHEEZING ORSHORTNESS OF BREATH 18 g 0   No current facility-administered medications on file prior to visit.    There are no Patient Instructions on file for this visit. No follow-ups on file.   Kris Hartmann, NP

## 2020-07-31 DIAGNOSIS — J301 Allergic rhinitis due to pollen: Secondary | ICD-10-CM | POA: Diagnosis not present

## 2020-08-07 DIAGNOSIS — J301 Allergic rhinitis due to pollen: Secondary | ICD-10-CM | POA: Diagnosis not present

## 2020-08-14 DIAGNOSIS — J301 Allergic rhinitis due to pollen: Secondary | ICD-10-CM | POA: Diagnosis not present

## 2020-08-21 DIAGNOSIS — J301 Allergic rhinitis due to pollen: Secondary | ICD-10-CM | POA: Diagnosis not present

## 2020-08-27 DIAGNOSIS — H6121 Impacted cerumen, right ear: Secondary | ICD-10-CM | POA: Diagnosis not present

## 2020-08-27 DIAGNOSIS — J310 Chronic rhinitis: Secondary | ICD-10-CM | POA: Diagnosis not present

## 2020-08-27 DIAGNOSIS — R040 Epistaxis: Secondary | ICD-10-CM | POA: Diagnosis not present

## 2020-08-28 DIAGNOSIS — J301 Allergic rhinitis due to pollen: Secondary | ICD-10-CM | POA: Diagnosis not present

## 2020-09-09 ENCOUNTER — Telehealth (INDEPENDENT_AMBULATORY_CARE_PROVIDER_SITE_OTHER): Payer: Self-pay

## 2020-09-09 DIAGNOSIS — J340 Abscess, furuncle and carbuncle of nose: Secondary | ICD-10-CM | POA: Diagnosis not present

## 2020-09-09 DIAGNOSIS — R040 Epistaxis: Secondary | ICD-10-CM | POA: Diagnosis not present

## 2020-09-09 NOTE — Telephone Encounter (Signed)
No she can't stop, she can't alter.  The risk of stroke is too high at this time.

## 2020-09-09 NOTE — Telephone Encounter (Signed)
I called the pt and made her aware of the NP instructions.

## 2020-09-09 NOTE — Telephone Encounter (Signed)
Pt called and left a VM on the nurses line saying that she had a stent placed in Sept and is now on Plavix and 81 Mg Asprin. The pt has recently been having nose bleeds an was seen by Dr. Tami Ribas who told the pt to ask Dr. Lucky Cowboy if it was ok for her to come off of the Plavix and 81 Mg Asprin for 5 days. Please advise.

## 2020-09-09 NOTE — Telephone Encounter (Signed)
Unfortunately the patient must remain on aspirin and plavix for a bare minimum of three months.  This is do the fact that her carotid stent is not fully healed at this time.  Her risk of stroke extremely high if she stops her DAPT.  Until 09/26/2020 she can't come of either.

## 2020-09-09 NOTE — Telephone Encounter (Signed)
The pt wants to know can she cut the dosage's in half?

## 2020-09-11 DIAGNOSIS — J301 Allergic rhinitis due to pollen: Secondary | ICD-10-CM | POA: Diagnosis not present

## 2020-09-12 DIAGNOSIS — T45515A Adverse effect of anticoagulants, initial encounter: Secondary | ICD-10-CM | POA: Diagnosis not present

## 2020-09-12 DIAGNOSIS — R040 Epistaxis: Secondary | ICD-10-CM | POA: Diagnosis not present

## 2020-09-13 DIAGNOSIS — J301 Allergic rhinitis due to pollen: Secondary | ICD-10-CM | POA: Diagnosis not present

## 2020-09-16 ENCOUNTER — Other Ambulatory Visit (INDEPENDENT_AMBULATORY_CARE_PROVIDER_SITE_OTHER): Payer: Self-pay | Admitting: Vascular Surgery

## 2020-09-18 DIAGNOSIS — J301 Allergic rhinitis due to pollen: Secondary | ICD-10-CM | POA: Diagnosis not present

## 2020-09-25 DIAGNOSIS — J301 Allergic rhinitis due to pollen: Secondary | ICD-10-CM | POA: Diagnosis not present

## 2020-10-02 DIAGNOSIS — J301 Allergic rhinitis due to pollen: Secondary | ICD-10-CM | POA: Diagnosis not present

## 2020-10-03 DIAGNOSIS — R059 Cough, unspecified: Secondary | ICD-10-CM | POA: Diagnosis not present

## 2020-10-03 DIAGNOSIS — R040 Epistaxis: Secondary | ICD-10-CM | POA: Diagnosis not present

## 2020-10-09 DIAGNOSIS — J301 Allergic rhinitis due to pollen: Secondary | ICD-10-CM | POA: Diagnosis not present

## 2020-10-10 ENCOUNTER — Other Ambulatory Visit: Payer: Self-pay | Admitting: *Deleted

## 2020-10-10 DIAGNOSIS — Z87891 Personal history of nicotine dependence: Secondary | ICD-10-CM

## 2020-10-10 DIAGNOSIS — Z122 Encounter for screening for malignant neoplasm of respiratory organs: Secondary | ICD-10-CM

## 2020-10-10 NOTE — Progress Notes (Signed)
Contacted and scheduled for annual lung screening scan. Patient is a current smoker with a 45 pack year history.  

## 2020-10-16 DIAGNOSIS — J301 Allergic rhinitis due to pollen: Secondary | ICD-10-CM | POA: Diagnosis not present

## 2020-10-23 DIAGNOSIS — J301 Allergic rhinitis due to pollen: Secondary | ICD-10-CM | POA: Diagnosis not present

## 2020-10-24 ENCOUNTER — Ambulatory Visit: Payer: Medicare PPO

## 2020-10-28 ENCOUNTER — Other Ambulatory Visit (INDEPENDENT_AMBULATORY_CARE_PROVIDER_SITE_OTHER): Payer: Self-pay | Admitting: Nurse Practitioner

## 2020-10-28 DIAGNOSIS — I6523 Occlusion and stenosis of bilateral carotid arteries: Secondary | ICD-10-CM

## 2020-10-30 ENCOUNTER — Ambulatory Visit (INDEPENDENT_AMBULATORY_CARE_PROVIDER_SITE_OTHER): Payer: Medicare PPO

## 2020-10-30 ENCOUNTER — Other Ambulatory Visit: Payer: Self-pay

## 2020-10-30 ENCOUNTER — Ambulatory Visit (INDEPENDENT_AMBULATORY_CARE_PROVIDER_SITE_OTHER): Payer: Medicare PPO | Admitting: Nurse Practitioner

## 2020-10-30 DIAGNOSIS — J301 Allergic rhinitis due to pollen: Secondary | ICD-10-CM | POA: Diagnosis not present

## 2020-10-30 DIAGNOSIS — I6523 Occlusion and stenosis of bilateral carotid arteries: Secondary | ICD-10-CM | POA: Diagnosis not present

## 2020-10-31 ENCOUNTER — Ambulatory Visit
Admission: RE | Admit: 2020-10-31 | Discharge: 2020-10-31 | Disposition: A | Discharge status: Home / Self Care | Payer: Medicare PPO | Source: Ambulatory Visit | Attending: Nurse Practitioner | Admitting: Nurse Practitioner

## 2020-10-31 DIAGNOSIS — Z87891 Personal history of nicotine dependence: Secondary | ICD-10-CM | POA: Diagnosis not present

## 2020-10-31 DIAGNOSIS — Z122 Encounter for screening for malignant neoplasm of respiratory organs: Secondary | ICD-10-CM | POA: Insufficient documentation

## 2020-10-31 DIAGNOSIS — F17210 Nicotine dependence, cigarettes, uncomplicated: Secondary | ICD-10-CM | POA: Diagnosis not present

## 2020-11-01 ENCOUNTER — Encounter: Payer: Self-pay | Admitting: *Deleted

## 2020-11-06 DIAGNOSIS — J301 Allergic rhinitis due to pollen: Secondary | ICD-10-CM | POA: Diagnosis not present

## 2020-11-20 ENCOUNTER — Ambulatory Visit (INDEPENDENT_AMBULATORY_CARE_PROVIDER_SITE_OTHER): Payer: Medicare PPO | Admitting: Nurse Practitioner

## 2020-11-20 ENCOUNTER — Encounter (INDEPENDENT_AMBULATORY_CARE_PROVIDER_SITE_OTHER): Payer: Self-pay | Admitting: Nurse Practitioner

## 2020-11-20 ENCOUNTER — Other Ambulatory Visit: Payer: Self-pay

## 2020-11-20 VITALS — BP 153/83 | HR 68 | Resp 16 | Wt 182.0 lb

## 2020-11-20 DIAGNOSIS — I10 Essential (primary) hypertension: Secondary | ICD-10-CM

## 2020-11-20 DIAGNOSIS — I6523 Occlusion and stenosis of bilateral carotid arteries: Secondary | ICD-10-CM | POA: Diagnosis not present

## 2020-11-20 DIAGNOSIS — E7800 Pure hypercholesterolemia, unspecified: Secondary | ICD-10-CM

## 2020-11-20 DIAGNOSIS — J301 Allergic rhinitis due to pollen: Secondary | ICD-10-CM | POA: Diagnosis not present

## 2020-11-20 MED ORDER — CLOPIDOGREL BISULFATE 75 MG PO TABS
75.0000 mg | ORAL_TABLET | Freq: Every day | ORAL | 3 refills | Status: DC
Start: 2020-11-20 — End: 2022-01-28

## 2020-11-20 MED ORDER — ATORVASTATIN CALCIUM 20 MG PO TABS
20.0000 mg | ORAL_TABLET | Freq: Every day | ORAL | 3 refills | Status: DC
Start: 2020-11-20 — End: 2021-05-20

## 2020-11-26 DIAGNOSIS — R6889 Other general symptoms and signs: Secondary | ICD-10-CM | POA: Diagnosis not present

## 2020-11-26 DIAGNOSIS — J019 Acute sinusitis, unspecified: Secondary | ICD-10-CM | POA: Diagnosis not present

## 2020-11-26 DIAGNOSIS — Z20828 Contact with and (suspected) exposure to other viral communicable diseases: Secondary | ICD-10-CM | POA: Diagnosis not present

## 2020-11-28 ENCOUNTER — Telehealth: Payer: Self-pay

## 2020-11-28 NOTE — Telephone Encounter (Signed)
[facility] - Client TELEPHONE ADVICE RECORD Access[facility]urse Patient Name: Heather Beasley Gender: Female DOB: 1956-05-17 Age: 65 Y 69 M 15 D Return Phone Number: [phone] (Primary), [phone] (Secondary) [place] [address] Client [facility] - Client Client Site [place] - Day Physician [name] - NP Contact Type Call Who Is Calling Patient / Member / Family / Caregiver Call Type Triage / Clinical Relationship To Patient Self Return Phone Number [phone] (Primary) Chief Complaint Heart palpitations or irregular heartbeat Reason for Call Symptomatic / Request for Health Information Initial Comment Pt has abnormal heart rate. Translation No Nurse Assessment Nurse: Gildardo Pounds, RN, Amy Date/Time (Eastern Time): 11/28/2020 3:29:08 PM Confirm and document reason for call. If symptomatic, describe symptoms. ---Caller states she has an abnormal heart rate. For a month or more she has been having headaches. She has had migraines in the past. She never thought it was due to her BP, because it has always been good when she goes to the doctor. She went to her vascular doctor last week & the nurse said it was elevated. She went to a UC Monday for what she thought was COVID, but it was a sinus infection. Her BP was elevated then too. It was 158/93. She normally runs about 120-124/70. Her COVID test was negative. Last night before bed everything was fine. She had only been walking around as normal checking that the doors were locked & the dogs were inside, etc. nothing strenuous. Once she got to her bedroom, she felt like her heart was beating really hard. She counted her HR by the carotid artery & took her HR. It was 109. It seemed like he breathing was not as good. Not SOB, but like she needed to take a deep breath. Does the patient have any new or worsening symptoms?  ---Yes Will a triage be completed? ---Yes Related visit to physician within the last 2 weeks? ---Yes Does the PT have any chronic conditions? (i.e. diabetes, asthma, this includes High risk factors for pregnancy, etc.) ---Yes List chronic conditions. ---emphysema, back surgery, carotid artery stent (has scar tissue from radiation, not due to plaque), brain cancer (adenocarcinoma), neuropathy left leg Is this a behavioral health or substance abuse call? ---No PLEASE NOTE: All timestamps contained within this report are represented as [place] Standard Time. CONFIDENTIALTY NOTICE: This fax transmission is intended only for the addressee. It contains information that is legally privileged, confidential or otherwise protected from use or disclosure. If you are not the intended recipient, you are strictly prohibited from reviewing, disclosing, copying using or disseminating any of this information or taking any action in reliance on or regarding this information. If you have received this fax in error, please notify us immediately by telephone so that we can arrange for its return to [place]. Phone: [phone], Toll-Free: [phone], Fax: [phone] Page: 2 of 2 Call Id: [id number] Guidelines Guideline Title Affirmed Question Affirmed Notes Nurse Date/Time Eilene Ghazi Time) Heart Rate and Heartbeat Questions Palpitations [place], RN, Amy 11/28/2020 3:37:00 PM Disp. Time Eilene Ghazi Time) Disposition Final User 11/28/2020 3:46:26 [place], RN, Amy Caller Disagree/Comply Comply Caller Understands Yes PreDisposition InappropriateToAsk Care Advice Given Per Guideline HOME CARE: * You should be able to treat this at home. REASSURANCE AND EDUCATION - PALPITATIONS: AVOID CAFFEINE: * Avoid caffeine-containing beverages (Reason: caffeine is a stimulant and can aggravate palpitations). * Examples include coffee, tea, colas, [address] [place], and some '  energy drinks'. LIMIT ALCOHOL: *  Limit your alcohol consumption to no more than 2 drinks a day. EXPECTED COURSE: * If your symptoms do not improve over the next couple days, then you should make an appointment to see your doctor. CARE ADVICE given per Heart Rate and Heartbeat Questions (Adult) guideline. * Everybody experiences palpitations at some point in their lives. In many circumstances it is simply a heightened awareness of the heart's normal beating. * Heart beating over 140 beats / minute * Chest pain, lightheadedness or difficulty breathing occurs CALL BACK IF: * More than 3 extra or skipped beats / minute * You become worse * People with anxiety or stress may describe a 'rapid heartbeat' or 'pounding' in their chest from their heart beating. * Here is some care advice that should help.

## 2020-11-28 NOTE — Telephone Encounter (Signed)
I spoke with pt;  Pt said the access nurse told her that her heart rate would be OK unless got up to 140. pts heart rate is never that high P usually around 70. pt has not taken BP and P today. pts husband took BP now 142/92 and p is 94 . For 1 month pt has H/A everyday. Tylenol does not help h/a. Pt is not on any BP meds. Pt is not SOB but has difficulty in getting a deep breath on and off; see access note about covid symptoms and testing at [facility] on 11/25/20. Pt said she was told she had a sinus infection with no temp and given doxycycline and pt feels a lot better. Pt does not have CP. Pt does not want to go back to UC since recently seen. For months pt said she may have difficulty breathing once or twice a day since stent was put in carotid artery Sept 2021. Pt said she does not want to schedule an appt at [facility] until sees how feels first of next wk. UC & ED precautions given and pt voiced understanding. If pt does need appt would it be in person or video visit. Sending note to Avie Echevaria NP and [facility] CMA.

## 2020-11-29 DIAGNOSIS — J301 Allergic rhinitis due to pollen: Secondary | ICD-10-CM | POA: Diagnosis not present

## 2020-11-29 NOTE — Telephone Encounter (Signed)
Noted, it sounds like she will call back and schedule an appt if needed

## 2020-11-30 ENCOUNTER — Encounter (INDEPENDENT_AMBULATORY_CARE_PROVIDER_SITE_OTHER): Payer: Self-pay | Admitting: Nurse Practitioner

## 2020-11-30 NOTE — Progress Notes (Signed)
Subjective:    Patient ID: Heather Beasley, female    DOB: June 22, 1956, 65 y.o.   MRN: [id number] Chief Complaint  Patient presents with  . Follow-up    U/s results    The patient is seen for follow up evaluation of carotid stenosis. The carotid stenosis followed by ultrasound.  The patient previously underwent left carotid artery stent placement 06/27/2020.  The patient denies amaurosis fugax. There is no recent history of TIA symptoms or focal motor deficits. There is no prior documented CVA.  The patient is taking enteric-coated aspirin 81 mg daily.  There is no history of migraine headaches. There is no history of seizures.  The patient has a history of coronary artery disease, no recent episodes of angina or shortness of breath. The patient denies PAD or claudication symptoms. There is a history of hyperlipidemia which is being treated with a statin.    Carotid Duplex done today shows less than 40% stenosis in the right ICA.  No stenosis seen in the left ICA.Marland Kitchen  No change compared to previous study   Review of Systems  Eyes: Negative for visual disturbance.  Neurological: Negative for facial asymmetry.  All other systems reviewed and are negative.      Objective:   Physical Exam Vitals reviewed.  HENT:     Head: Normocephalic.  Neck:     Vascular: No carotid bruit.  Cardiovascular:     Rate and Rhythm: Normal rate.     Pulses: Normal pulses.  Pulmonary:     Effort: Pulmonary effort is normal.  Neurological:     Mental Status: She is alert and oriented to person, place, and time.  Psychiatric:        Mood and Affect: Mood normal.        Behavior: Behavior normal.        Thought Content: Thought content normal.        Judgment: Judgment normal.     BP (!) 153/83 (BP Location: Right Arm)   Pulse 68   Resp 16   Wt 182 lb (82.6 kg)   BMI 30.29 kg/m   Past Medical History:  Diagnosis Date  . Allergic rhinitis   . Arthritis   . Asthma   . Cancer of nasal  cavity and sinus ([place]) 07/07/2015   radiation  . Cervical spondylosis   . Colitis   . Colon polyps   . Depression   . Diverticulosis   . GERD (gastroesophageal reflux disease)   . Glaucoma   . Headaches, cluster   . Hyperlipidemia   . IBS (irritable bowel syndrome)   . Kidney stones   . Melanoma ([place])   . Neuropathy   . PONV (postoperative nausea and vomiting)   . Tobacco abuse     Social History   Socioeconomic History  . Marital status: Married    Spouse name: Not on file  . Number of children: 2  . Years of education: Not on file  . Highest education level: Not on file  Occupational History  . Occupation: Scientist, water quality  Tobacco Use  . Smoking status: Current Every Day Smoker    Packs/day: 0.25    Years: 39.00    Pack years: 9.75    Types: Cigarettes  . Smokeless tobacco: Never Used  . Tobacco comment: e cigs currently--trying to quit  Substance and Sexual Activity  . Alcohol use: No  . Drug use: No  . Sexual activity: Not Currently    Partners: Male  Other Topics Concern  . Not on file  Social History Narrative   Lives in [place] with husband. Dog in home. Work - disabled for neck and back pain.   Social Determinants of Health   Financial Resource Strain: Not on file  Food Insecurity: Not on file  Transportation Needs: Not on file  Physical Activity: Not on file  Stress: Not on file  Social Connections: Not on file  Intimate Partner Violence: Not on file    Past Surgical History:  Procedure Laterality Date  . ABDOMINAL HYSTERECTOMY  1998  . APPENDECTOMY  1998  . ARM FX REPAIR  2001  . BACK SURGERY    . CAROTID PTA/STENT INTERVENTION Left 06/27/2020   Procedure: CAROTID PTA/STENT INTERVENTION;  Surgeon: Algernon Huxley, MD;  Location: [facility];  Service: Cardiovascular;  Laterality: Left;  . CERVICAL FUSION  2010  . CHOLECYSTECTOMY  08/2011   Dr Rochel Brome  . COLONOSCOPY     Dr Tiffany Kocher  . CYSTOSCOPY     Dr. Bernardo Heater  . DILATION AND  CURETTAGE OF UTERUS    . ESOPHAGOGASTRODUODENOSCOPY  2012   Dr Tiffany Kocher  . FRACTURE SURGERY  2001   plate in right wrist and arm  . IMAGE GUIDED SINUS SURGERY N/A 06/25/2015   Procedure: IMAGE GUIDED SINUS SURGERY;  Surgeon: Beverly Gust, MD;  Location: [facility];  Service: ENT;  Laterality: N/A;  . LUMBAR SPINE SURGERY  07/2004  . OOPHORECTOMY    . POLYPECTOMY N/A 06/25/2015   Procedure: POLYPECTOMY NASAL;  Surgeon: Beverly Gust, MD;  Location: [facility];  Service: ENT;  Laterality: N/A;  . [place]  . VAGINAL DELIVERY     x2    Family History  Problem Relation Age of Onset  . Hypertension Mother   . Hyperlipidemia Mother   . Diabetes Mother   . Colon cancer Father   . Pancreatic cancer Father   . Cancer Father        Colon & Pancreatic  . Breast cancer Paternal Grandmother 67  . Diabetes Paternal Grandmother   . Coronary artery disease Paternal Grandmother   . Heart failure Paternal Grandmother   . Cancer Paternal Grandmother        breast  . Stroke Brother     Allergies  Allergen Reactions  . Albumin (Human) Rash  . Amoxicillin-Pot Clavulanate Hives  . Gabapentin Shortness Of Breath  . Latex Dermatitis and Rash  . Betadine [Povidone Iodine] Dermatitis, Rash and Other (See Comments)    Skin burning Topical betadine and iodine have this reaction with patient.  IV contrast is NOT a problem.  jkl  . Sucralfate     Other reaction(s): Other (See Comments) Abdominal Pain  . Amoxicillin     REACTION: rash  . Chantix  [Varenicline Tartrate]   . Chantix [Varenicline Tartrate]     Heart racing  . Erythromycin     REACTION: rash  . Sucralfate Nausea And Vomiting    Heart racing Lightheaded     CBC Latest Ref Rng & Units 06/28/2020 05/25/2019 08/23/2018  WBC 4.0 - 10.5 K/uL 4.4 4.6 3.8(L)  Hemoglobin 12.0 - 15.0 g/dL 12.2 14.0 13.6  Hematocrit 36.0 - 46.0 % 37.8 43.8 42.9  Platelets 150 - 400 K/uL 180 216 208      CMP     Component Value Date/Time    NA 141 06/28/2020 0406   NA 138 04/10/2019 1439   K 4.2 06/28/2020 0406   CL  110 06/28/2020 0406   CO2 26 06/28/2020 0406   GLUCOSE 99 06/28/2020 0406   BUN 20 06/28/2020 0406   BUN 16 04/10/2019 1439   CREATININE 0.93 06/28/2020 0406   CALCIUM 8.4 (L) 06/28/2020 0406   PROT 7.0 05/25/2019 1010   ALBUMIN 3.9 05/25/2019 1010   AST 21 05/25/2019 1010   ALT 15 05/25/2019 1010   ALKPHOS 98 05/25/2019 1010   BILITOT 0.8 05/25/2019 1010   GFRNONAA >60 06/28/2020 0406   GFRAA >60 06/28/2020 0406     No results found.     Assessment & Plan:   1. Bilateral carotid artery stenosis Recommend:  Given the patient's asymptomatic subcritical stenosis no further invasive testing or surgery at this time.  Duplex ultrasound shows less than 40% stenosis bilaterally.  Continue antiplatelet therapy as prescribed Continue management of CAD, HTN and Hyperlipidemia Healthy heart diet,  encouraged exercise at least 4 times per week Follow up in 6 months with duplex ultrasound and physical exam  - VAS US CAROTID; Future  2. Pure hypercholesterolemia Continue statin as ordered and reviewed, no changes at this time   3. Essential hypertension, benign Continue antihypertensive medications as already ordered, these medications have been reviewed and there are no changes at this time.    Current Outpatient Medications on File Prior to Visit  Medication Sig Dispense Refill  . aspirin EC 81 MG tablet Take 81 mg by mouth daily.    . colestipol (COLESTID) 1 g tablet Take by mouth.    . cyclobenzaprine (FLEXERIL) 10 MG tablet Take 1 tablet (10 mg total) by mouth 3 (three) times daily. 90 tablet 6  . EPINEPHrine 0.3 mg/0.3 mL IJ SOAJ injection Inject 1 mg into the skin as needed.    . fesoterodine (TOVIAZ) 4 MG TB24 tablet Take 1 tablet (4 mg total) by mouth daily. 90 tablet 3  . fexofenadine (ALLEGRA) 180 MG tablet Take 180 mg by mouth daily.    Marland Kitchen loperamide (IMODIUM A-D) 2 MG tablet Take 2 mg  by mouth 2 (two) times a day.    . Multiple Vitamins-Minerals (CENTRUM ADULTS PO) Take 1 tablet by mouth.    . nystatin (MYCOSTATIN/NYSTOP) powder Apply 1 application topically 3 (three) times daily. 15 g 0  . pantoprazole (PROTONIX) 40 MG tablet Take 1 tablet (40 mg total) by mouth 2 (two) times daily. 60 tablet 11  . VENTOLIN HFA 108 (90 Base) MCG/ACT inhaler INHALE 2 PUFFS INTO THE LUNGS EVERY 6 HOURS AS NEEDED FOR WHEEZING ORSHORTNESS OF BREATH 18 g 0  . cetirizine (ZYRTEC) 10 MG tablet Take 10 mg by mouth daily. Allergies.     No current facility-administered medications on file prior to visit.    There are no Patient Instructions on file for this visit. No follow-ups on file.   Kris Hartmann, NP

## 2020-12-03 DIAGNOSIS — R040 Epistaxis: Secondary | ICD-10-CM | POA: Diagnosis not present

## 2020-12-03 DIAGNOSIS — T45515A Adverse effect of anticoagulants, initial encounter: Secondary | ICD-10-CM | POA: Diagnosis not present

## 2020-12-04 DIAGNOSIS — J301 Allergic rhinitis due to pollen: Secondary | ICD-10-CM | POA: Diagnosis not present

## 2020-12-11 DIAGNOSIS — J301 Allergic rhinitis due to pollen: Secondary | ICD-10-CM | POA: Diagnosis not present

## 2020-12-18 DIAGNOSIS — J301 Allergic rhinitis due to pollen: Secondary | ICD-10-CM | POA: Diagnosis not present

## 2020-12-25 DIAGNOSIS — J301 Allergic rhinitis due to pollen: Secondary | ICD-10-CM | POA: Diagnosis not present

## 2021-01-01 DIAGNOSIS — J301 Allergic rhinitis due to pollen: Secondary | ICD-10-CM | POA: Diagnosis not present

## 2021-01-08 DIAGNOSIS — J301 Allergic rhinitis due to pollen: Secondary | ICD-10-CM | POA: Diagnosis not present

## 2021-01-15 DIAGNOSIS — J301 Allergic rhinitis due to pollen: Secondary | ICD-10-CM | POA: Diagnosis not present

## 2021-01-20 DIAGNOSIS — J301 Allergic rhinitis due to pollen: Secondary | ICD-10-CM | POA: Diagnosis not present

## 2021-01-27 DIAGNOSIS — J301 Allergic rhinitis due to pollen: Secondary | ICD-10-CM | POA: Diagnosis not present

## 2021-01-29 ENCOUNTER — Other Ambulatory Visit (INDEPENDENT_AMBULATORY_CARE_PROVIDER_SITE_OTHER): Payer: Self-pay | Admitting: Nurse Practitioner

## 2021-02-03 DIAGNOSIS — J301 Allergic rhinitis due to pollen: Secondary | ICD-10-CM | POA: Diagnosis not present

## 2021-02-10 DIAGNOSIS — J301 Allergic rhinitis due to pollen: Secondary | ICD-10-CM | POA: Diagnosis not present

## 2021-02-17 DIAGNOSIS — J301 Allergic rhinitis due to pollen: Secondary | ICD-10-CM | POA: Diagnosis not present

## 2021-02-18 DIAGNOSIS — J301 Allergic rhinitis due to pollen: Secondary | ICD-10-CM | POA: Diagnosis not present

## 2021-02-24 DIAGNOSIS — J301 Allergic rhinitis due to pollen: Secondary | ICD-10-CM | POA: Diagnosis not present

## 2021-03-03 DIAGNOSIS — J301 Allergic rhinitis due to pollen: Secondary | ICD-10-CM | POA: Diagnosis not present

## 2021-03-10 DIAGNOSIS — J301 Allergic rhinitis due to pollen: Secondary | ICD-10-CM | POA: Diagnosis not present

## 2021-03-17 DIAGNOSIS — J301 Allergic rhinitis due to pollen: Secondary | ICD-10-CM | POA: Diagnosis not present

## 2021-03-19 ENCOUNTER — Other Ambulatory Visit: Payer: Self-pay

## 2021-03-19 ENCOUNTER — Ambulatory Visit (INDEPENDENT_AMBULATORY_CARE_PROVIDER_SITE_OTHER): Payer: Medicare PPO | Admitting: Dermatology

## 2021-03-19 ENCOUNTER — Encounter: Payer: Self-pay | Admitting: Dermatology

## 2021-03-19 DIAGNOSIS — L578 Other skin changes due to chronic exposure to nonionizing radiation: Secondary | ICD-10-CM

## 2021-03-19 DIAGNOSIS — L821 Other seborrheic keratosis: Secondary | ICD-10-CM | POA: Diagnosis not present

## 2021-03-19 DIAGNOSIS — W57XXXA Bitten or stung by nonvenomous insect and other nonvenomous arthropods, initial encounter: Secondary | ICD-10-CM

## 2021-03-19 DIAGNOSIS — L814 Other melanin hyperpigmentation: Secondary | ICD-10-CM | POA: Diagnosis not present

## 2021-03-19 DIAGNOSIS — Z1283 Encounter for screening for malignant neoplasm of skin: Secondary | ICD-10-CM | POA: Diagnosis not present

## 2021-03-19 DIAGNOSIS — S80861A Insect bite (nonvenomous), right lower leg, initial encounter: Secondary | ICD-10-CM | POA: Diagnosis not present

## 2021-03-19 DIAGNOSIS — D229 Melanocytic nevi, unspecified: Secondary | ICD-10-CM

## 2021-03-19 DIAGNOSIS — L570 Actinic keratosis: Secondary | ICD-10-CM

## 2021-03-19 DIAGNOSIS — L304 Erythema intertrigo: Secondary | ICD-10-CM

## 2021-03-19 DIAGNOSIS — Z85820 Personal history of malignant melanoma of skin: Secondary | ICD-10-CM | POA: Diagnosis not present

## 2021-03-19 DIAGNOSIS — D1800 Hemangioma unspecified site: Secondary | ICD-10-CM

## 2021-03-19 MED ORDER — KETOCONAZOLE 2 % EX CREA: TOPICAL_CREAM | CUTANEOUS | 11 refills | Status: DC

## 2021-03-19 NOTE — Progress Notes (Signed)
Follow-Up Visit   Subjective  Heather Beasley is a 65 y.o. female who presents for the following: Annual Exam (Hx MM ). The patient presents for Total-Body Skin Exam (TBSE) for skin cancer screening and mole check. Patient had a tick bite a few weeks ago in the R popliteal area and would like it checked today.   The following portions of the chart were reviewed this encounter and updated as appropriate:   Tobacco  Allergies  Meds  Problems  Med Hx  Surg Hx  Fam Hx     Review of Systems:  No other skin or systemic complaints except as noted in HPI or Assessment and Plan.  Objective  Well appearing patient in no apparent distress; mood and affect are within normal limits.  A full examination was performed including scalp, head, eyes, ears, nose, lips, neck, chest, axillae, abdomen, back, buttocks, bilateral upper extremities, bilateral lower extremities, hands, feet, fingers, toes, fingernails, and toenails. All findings within normal limits unless otherwise noted below.  Objective  Right Ear x 1: Erythematous thin papules/macules with gritty scale.   Objective  Right Popliteal Fossa: Erythematous papule.  Objective  Inframammary area, L groin: Erythema   Assessment & Plan  AK (actinic keratosis) Right Ear x 1  Destruction of lesion - Right Ear x 1 Complexity: simple   Destruction method: cryotherapy   Informed consent: discussed and consent obtained   Timeout:  patient name, date of birth, surgical site, and procedure verified Lesion destroyed using liquid nitrogen: Yes   Region frozen until ice ball extended beyond lesion: Yes   Outcome: patient tolerated procedure well with no complications   Post-procedure details: wound care instructions given    Tick bite of right lower leg, initial encounter Right Popliteal Fossa  Benign-appearing.  Observation.  No tx needed.  Erythema intertrigo Inframammary area, L groin  Intertrigo is a chronic recurrent rash that  occurs in skin fold areas that may be associated with friction; heat; moisture; yeast; fungus; and bacteria.  It is exacerbated by increased movement / activity; sweating; and higher atmospheric temperature.  Start Ketoconazole 2% cream apply to aa's BID x 2 weeks. If not improving will send in Skin Medicinals mix.  ketoconazole (NIZORAL) 2 % cream - Inframammary area, L groin  Skin cancer screening   Lentigines - Scattered tan macules - Due to sun exposure - Benign-appering, observe - Recommend daily broad spectrum sunscreen SPF 30+ to sun-exposed areas, reapply every 2 hours as needed. - Call for any changes  Seborrheic Keratoses - Stuck-on, waxy, tan-brown papules and/or plaques  - Benign-appearing - Discussed benign etiology and prognosis. - Observe - Call for any changes  Melanocytic Nevi - Tan-brown and/or pink-flesh-colored symmetric macules and papules - Benign appearing on exam today - Observation - Call clinic for new or changing moles - Recommend daily use of broad spectrum spf 30+ sunscreen to sun-exposed areas.   Hemangiomas - Red papules - Discussed benign nature - Observe - Call for any changes  Actinic Damage - Chronic condition, secondary to cumulative UV/sun exposure - diffuse scaly erythematous macules with underlying dyspigmentation - Recommend daily broad spectrum sunscreen SPF 30+ to sun-exposed areas, reapply every 2 hours as needed.  - Staying in the shade or wearing long sleeves, sun glasses (UVA+UVB protection) and wide brim hats (4-inch brim around the entire circumference of the hat) are also recommended for sun protection.  - Call for new or changing lesions.  History of Melanoma - above L  knee  - No evidence of recurrence today - No lymphadenopathy - Recommend regular full body skin exams - Recommend daily broad spectrum sunscreen SPF 30+ to sun-exposed areas, reapply every 2 hours as needed.  - Call if any new or changing lesions are noted  between office visits  Skin cancer screening performed today.  Return in about 1 year (around 03/19/2022) for TBSE - Hx MM .   Luther Redo, CMA, am acting as scribe for Sarina Ser, MD .  Documentation: I have reviewed the above documentation for accuracy and completeness, and I agree with the above.  Sarina Ser, MD

## 2021-03-19 NOTE — Patient Instructions (Signed)

## 2021-03-24 ENCOUNTER — Encounter: Payer: Self-pay | Admitting: Dermatology

## 2021-03-31 DIAGNOSIS — J301 Allergic rhinitis due to pollen: Secondary | ICD-10-CM | POA: Diagnosis not present

## 2021-04-03 DIAGNOSIS — R040 Epistaxis: Secondary | ICD-10-CM | POA: Diagnosis not present

## 2021-04-03 DIAGNOSIS — Z8522 Personal history of malignant neoplasm of nasal cavities, middle ear, and accessory sinuses: Secondary | ICD-10-CM | POA: Diagnosis not present

## 2021-04-07 DIAGNOSIS — J301 Allergic rhinitis due to pollen: Secondary | ICD-10-CM | POA: Diagnosis not present

## 2021-04-14 DIAGNOSIS — J301 Allergic rhinitis due to pollen: Secondary | ICD-10-CM | POA: Diagnosis not present

## 2021-04-21 DIAGNOSIS — J301 Allergic rhinitis due to pollen: Secondary | ICD-10-CM | POA: Diagnosis not present

## 2021-04-30 DIAGNOSIS — J301 Allergic rhinitis due to pollen: Secondary | ICD-10-CM | POA: Diagnosis not present

## 2021-05-12 DIAGNOSIS — J301 Allergic rhinitis due to pollen: Secondary | ICD-10-CM | POA: Diagnosis not present

## 2021-05-16 DIAGNOSIS — J301 Allergic rhinitis due to pollen: Secondary | ICD-10-CM | POA: Diagnosis not present

## 2021-05-19 DIAGNOSIS — J301 Allergic rhinitis due to pollen: Secondary | ICD-10-CM | POA: Diagnosis not present

## 2021-05-20 ENCOUNTER — Ambulatory Visit (INDEPENDENT_AMBULATORY_CARE_PROVIDER_SITE_OTHER): Payer: Medicare PPO

## 2021-05-20 ENCOUNTER — Encounter (INDEPENDENT_AMBULATORY_CARE_PROVIDER_SITE_OTHER): Payer: Self-pay | Admitting: Nurse Practitioner

## 2021-05-20 ENCOUNTER — Other Ambulatory Visit: Payer: Self-pay

## 2021-05-20 ENCOUNTER — Ambulatory Visit (INDEPENDENT_AMBULATORY_CARE_PROVIDER_SITE_OTHER): Payer: Medicare PPO | Admitting: Nurse Practitioner

## 2021-05-20 VITALS — BP 131/82 | HR 81 | Resp 16 | Wt 179.4 lb

## 2021-05-20 DIAGNOSIS — I6522 Occlusion and stenosis of left carotid artery: Secondary | ICD-10-CM

## 2021-05-20 DIAGNOSIS — E7800 Pure hypercholesterolemia, unspecified: Secondary | ICD-10-CM

## 2021-05-20 DIAGNOSIS — I6523 Occlusion and stenosis of bilateral carotid arteries: Secondary | ICD-10-CM | POA: Diagnosis not present

## 2021-05-20 DIAGNOSIS — I10 Essential (primary) hypertension: Secondary | ICD-10-CM | POA: Diagnosis not present

## 2021-05-20 MED ORDER — ATORVASTATIN CALCIUM 20 MG PO TABS: 20.0000 mg | ORAL_TABLET | Freq: Every day | ORAL | 3 refills | Status: DC

## 2021-05-20 NOTE — Progress Notes (Signed)
Subjective:    Patient ID: Heather Beasley, female    DOB: [birth year], 65 y.o.   MRN: [id number] Chief Complaint  Patient presents with   Follow-up    Ultrasound follow up    The patient is seen for follow up evaluation of carotid stenosis. The carotid stenosis followed by ultrasound.   The patient denies amaurosis fugax. There is no recent history of TIA symptoms or focal motor deficits. There is no prior documented CVA.  The patient is taking enteric-coated aspirin 81 mg daily.  She has also been continue to take her Plavix as well.  The patient notes that she has had significant bruising while taking both Plavix and aspirin.  There is no history of migraine headaches. There is no history of seizures.  The patient has a history of coronary artery disease, no recent episodes of angina or shortness of breath. The patient denies PAD or claudication symptoms. There is a history of hyperlipidemia which is being treated with a statin.    Duplex ultrasound shows 1 to 39% stenosis of the right ICA with no stenosis and patent left ICA stent.   Review of Systems  Hematological:  Bruises/bleeds easily.  All other systems reviewed and are negative.     Objective:   Physical Exam Vitals reviewed.  HENT:     Head: Normocephalic.  Neck:     Vascular: No carotid bruit.  Cardiovascular:     Rate and Rhythm: Normal rate.     Pulses: Normal pulses.  Pulmonary:     Effort: Pulmonary effort is normal.  Skin:    Findings: Bruising present.  Neurological:     Mental Status: She is alert and oriented to person, place, and time.  Psychiatric:        Mood and Affect: Mood normal.        Behavior: Behavior normal.        Thought Content: Thought content normal.        Judgment: Judgment normal.    BP 131/82 (BP Location: Right Arm)   Pulse 81   Resp 16   Wt 179 lb 6.4 oz (81.4 kg)   BMI 29.85 kg/m   Past Medical History:  Diagnosis Date   Allergic rhinitis    Arthritis     Asthma    Cancer of nasal cavity and sinus ([facility]) 07/07/2015   radiation   Cervical spondylosis    Colitis    Colon polyps    Depression    Diverticulosis    GERD (gastroesophageal reflux disease)    Glaucoma    Headaches, cluster    Hyperlipidemia    IBS (irritable bowel syndrome)    Kidney stones    Melanoma ([place])    above the L knee    Neuropathy    PONV (postoperative nausea and vomiting)    Tobacco abuse     Social History   Socioeconomic History   Marital status: Married    Spouse name: Not on file   Number of children: 2   Years of education: Not on file   Highest education level: Not on file  Occupational History   Occupation: Scientist, water quality  Tobacco Use   Smoking status: Every Day    Packs/day: 0.25    Years: 39.00    Pack years: 9.75    Types: Cigarettes   Smokeless tobacco: Never   Tobacco comments:    e cigs currently--trying to quit  Substance and Sexual Activity   Alcohol use: No  Drug use: No   Sexual activity: Not Currently    Partners: Male  Other Topics Concern   Not on file  Social History Narrative   Lives in [place] with husband. Dog in home. Work - disabled for neck and back pain.   Social Determinants of Health   Financial Resource Strain: Not on file  Food Insecurity: Not on file  Transportation Needs: Not on file  Physical Activity: Not on file  Stress: Not on file  Social Connections: Not on file  Intimate Partner Violence: Not on file    Past Surgical History:  Procedure Laterality Date   [place] PTA/STENT INTERVENTION Left 06/27/2020   Procedure: CAROTID PTA/STENT INTERVENTION;  Surgeon: Algernon Huxley, MD;  Location: [facility];  Service: Cardiovascular;  Laterality: Left;   CERVICAL FUSION  2010   CHOLECYSTECTOMY  08/2011   Dr Rochel Brome   COLONOSCOPY     Dr Tiffany Kocher   CYSTOSCOPY     Dr. Bernardo Heater   DILATION AND CURETTAGE  OF UTERUS     ESOPHAGOGASTRODUODENOSCOPY  2012   Dr Tiffany Kocher   FRACTURE SURGERY  2001   plate in right wrist and arm   IMAGE GUIDED SINUS SURGERY N/A 06/25/2015   Procedure: IMAGE GUIDED SINUS SURGERY;  Surgeon: Beverly Gust, MD;  Location: [facility];  Service: ENT;  Laterality: N/A;   LUMBAR SPINE SURGERY  07/2004   OOPHORECTOMY     POLYPECTOMY N/A 06/25/2015   Procedure: POLYPECTOMY NASAL;  Surgeon: Beverly Gust, MD;  Location: [facility];  Service: ENT;  Laterality: N/A;   TONSILLECTOMY  1968   VAGINAL DELIVERY     x2    Family History  Problem Relation Age of Onset   Hypertension Mother    Hyperlipidemia Mother    Diabetes Mother    Colon cancer Father    Pancreatic cancer Father    Cancer Father        Colon & Pancreatic   Breast cancer Paternal Grandmother 63   Diabetes Paternal Grandmother    Coronary artery disease Paternal Grandmother    Heart failure Paternal Grandmother    Cancer Paternal Grandmother        breast   Stroke Brother     Allergies  Allergen Reactions   Albumin (Human) Rash   Amoxicillin-Pot Clavulanate Hives   Gabapentin Shortness Of Breath   Latex Dermatitis and Rash   Betadine [Povidone Iodine] Dermatitis, Rash and Other (See Comments)    Skin burning Topical betadine and iodine have this reaction with patient.  IV contrast is NOT a problem.  jkl   Sucralfate     Other reaction(s): Other (See Comments) Abdominal Pain   Amoxicillin     REACTION: rash   Chantix  [Varenicline Tartrate]    Chantix [Varenicline Tartrate]     Heart racing   Erythromycin     REACTION: rash   Sucralfate Nausea And Vomiting    Heart racing Lightheaded     CBC Latest Ref Rng & Units 06/28/2020 05/25/2019 08/23/2018  WBC 4.0 - 10.5 K/uL 4.4 4.6 3.8(L)  Hemoglobin 12.0 - 15.0 g/dL 12.2 14.0 13.6  Hematocrit 36.0 - 46.0 % 37.8 43.8 42.9  Platelets 150 - 400 K/uL 180 216 208      CMP     Component Value Date/Time   NA 141 06/28/2020 0406  NA 138  04/10/2019 1439   K 4.2 06/28/2020 0406   CL 110 06/28/2020 0406   CO2 26 06/28/2020 0406   GLUCOSE 99 06/28/2020 0406   BUN 20 06/28/2020 0406   BUN 16 04/10/2019 1439   CREATININE 0.93 06/28/2020 0406   CALCIUM 8.4 (L) 06/28/2020 0406   PROT 7.0 05/25/2019 1010   ALBUMIN 3.9 05/25/2019 1010   AST 21 05/25/2019 1010   ALT 15 05/25/2019 1010   ALKPHOS 98 05/25/2019 1010   BILITOT 0.8 05/25/2019 1010   GFRNONAA >60 06/28/2020 0406   GFRAA >60 06/28/2020 0406     No results found.     Assessment & Plan:   1. Carotid stenosis, left Recommend:  Given the patient's asymptomatic subcritical stenosis no further invasive testing or surgery at this time.  Duplex ultrasound shows 1 to 39% stenosis of the right ICA with no stenosis and patent left ICA stent.  Continue antiplatelet therapy as prescribed Continue management of CAD, HTN and Hyperlipidemia Healthy heart diet,  encouraged exercise at least 4 times per week Discussion with the patient and her dual antiplatelet therapy.  Patient will continue both aspirin and Plavix until September at which she will be 1 year out from her carotid stent placement.  That at that time the patient can transition to Plavix only.  Follow up in 12 months with duplex ultrasound and physical exam    2. Pure hypercholesterolemia Continue statin as ordered and reviewed, no changes at this time   3. Essential hypertension, benign Continue antihypertensive medications as already ordered, these medications have been reviewed and there are no changes at this time.    Current Outpatient Medications on File Prior to Visit  Medication Sig Dispense Refill   aspirin EC 81 MG tablet Take 81 mg by mouth daily.     cetirizine (ZYRTEC) 10 MG tablet Take 10 mg by mouth daily. Allergies.     clopidogrel (PLAVIX) 75 MG tablet Take 1 tablet (75 mg total) by mouth daily. 90 tablet 3   colestipol (COLESTID) 1 g tablet Take by mouth.     cyclobenzaprine  (FLEXERIL) 10 MG tablet Take 1 tablet (10 mg total) by mouth 3 (three) times daily. 90 tablet 6   EPINEPHrine 0.3 mg/0.3 mL IJ SOAJ injection Inject 1 mg into the skin as needed.     fesoterodine (TOVIAZ) 4 MG TB24 tablet Take 1 tablet (4 mg total) by mouth daily. 90 tablet 3   fexofenadine (ALLEGRA) 180 MG tablet Take 180 mg by mouth daily.     ketoconazole (NIZORAL) 2 % cream Apply to aa's BID x 2 week. Then PRN thereafter. 60 g 11   loperamide (IMODIUM A-D) 2 MG tablet Take 2 mg by mouth 2 (two) times a day.     Multiple Vitamins-Minerals (CENTRUM ADULTS PO) Take 1 tablet by mouth.     nystatin (MYCOSTATIN/NYSTOP) powder Apply 1 application topically 3 (three) times daily. 15 g 0   pantoprazole (PROTONIX) 40 MG tablet Take 1 tablet (40 mg total) by mouth 2 (two) times daily. 60 tablet 11   VENTOLIN HFA 108 (90 Base) MCG/ACT inhaler INHALE 2 PUFFS INTO THE LUNGS EVERY 6 HOURS AS NEEDED FOR WHEEZING ORSHORTNESS OF BREATH 18 g 0   No current facility-administered medications on file prior to visit.    There are no Patient Instructions on file for this visit. No follow-ups on file.   Kris Hartmann, NP

## 2021-05-26 DIAGNOSIS — J301 Allergic rhinitis due to pollen: Secondary | ICD-10-CM | POA: Diagnosis not present

## 2021-06-02 DIAGNOSIS — J301 Allergic rhinitis due to pollen: Secondary | ICD-10-CM | POA: Diagnosis not present

## 2021-06-09 DIAGNOSIS — J301 Allergic rhinitis due to pollen: Secondary | ICD-10-CM | POA: Diagnosis not present

## 2021-06-16 DIAGNOSIS — J301 Allergic rhinitis due to pollen: Secondary | ICD-10-CM | POA: Diagnosis not present

## 2021-06-23 DIAGNOSIS — J301 Allergic rhinitis due to pollen: Secondary | ICD-10-CM | POA: Diagnosis not present

## 2021-06-26 ENCOUNTER — Telehealth: Payer: Self-pay | Admitting: Internal Medicine

## 2021-06-26 NOTE — Telephone Encounter (Signed)
Heather Beasley called in wanted to know about getting a mammogram, explained that she has to come in due to its been more than year and she stated she is not going to come in due to when she had issues with her BP in February she was refused to be seen. And she said she is not coming here.

## 2021-07-07 DIAGNOSIS — J301 Allergic rhinitis due to pollen: Secondary | ICD-10-CM | POA: Diagnosis not present

## 2021-07-21 DIAGNOSIS — J301 Allergic rhinitis due to pollen: Secondary | ICD-10-CM | POA: Diagnosis not present

## 2021-07-22 DIAGNOSIS — H60543 Acute eczematoid otitis externa, bilateral: Secondary | ICD-10-CM | POA: Diagnosis not present

## 2021-07-22 DIAGNOSIS — J340 Abscess, furuncle and carbuncle of nose: Secondary | ICD-10-CM | POA: Diagnosis not present

## 2021-07-28 DIAGNOSIS — J301 Allergic rhinitis due to pollen: Secondary | ICD-10-CM | POA: Diagnosis not present

## 2021-08-04 DIAGNOSIS — J301 Allergic rhinitis due to pollen: Secondary | ICD-10-CM | POA: Diagnosis not present

## 2021-08-06 DIAGNOSIS — H60543 Acute eczematoid otitis externa, bilateral: Secondary | ICD-10-CM | POA: Diagnosis not present

## 2021-08-06 DIAGNOSIS — J340 Abscess, furuncle and carbuncle of nose: Secondary | ICD-10-CM | POA: Diagnosis not present

## 2021-08-08 DIAGNOSIS — J301 Allergic rhinitis due to pollen: Secondary | ICD-10-CM | POA: Diagnosis not present

## 2021-08-11 DIAGNOSIS — J301 Allergic rhinitis due to pollen: Secondary | ICD-10-CM | POA: Diagnosis not present

## 2021-08-12 DIAGNOSIS — R635 Abnormal weight gain: Secondary | ICD-10-CM | POA: Diagnosis not present

## 2021-08-18 DIAGNOSIS — J301 Allergic rhinitis due to pollen: Secondary | ICD-10-CM | POA: Diagnosis not present

## 2021-08-25 DIAGNOSIS — J301 Allergic rhinitis due to pollen: Secondary | ICD-10-CM | POA: Diagnosis not present

## 2021-09-15 DIAGNOSIS — J301 Allergic rhinitis due to pollen: Secondary | ICD-10-CM | POA: Diagnosis not present

## 2021-09-22 DIAGNOSIS — J301 Allergic rhinitis due to pollen: Secondary | ICD-10-CM | POA: Diagnosis not present

## 2021-09-29 DIAGNOSIS — J301 Allergic rhinitis due to pollen: Secondary | ICD-10-CM | POA: Diagnosis not present

## 2021-10-02 DIAGNOSIS — Z8522 Personal history of malignant neoplasm of nasal cavities, middle ear, and accessory sinuses: Secondary | ICD-10-CM | POA: Diagnosis not present

## 2021-10-02 DIAGNOSIS — J300 Vasomotor rhinitis: Secondary | ICD-10-CM | POA: Diagnosis not present

## 2021-10-06 DIAGNOSIS — J301 Allergic rhinitis due to pollen: Secondary | ICD-10-CM | POA: Diagnosis not present

## 2021-10-13 DIAGNOSIS — J301 Allergic rhinitis due to pollen: Secondary | ICD-10-CM | POA: Diagnosis not present

## 2021-11-03 DIAGNOSIS — J301 Allergic rhinitis due to pollen: Secondary | ICD-10-CM | POA: Diagnosis not present

## 2021-11-07 DIAGNOSIS — J301 Allergic rhinitis due to pollen: Secondary | ICD-10-CM | POA: Diagnosis not present

## 2021-11-10 DIAGNOSIS — J301 Allergic rhinitis due to pollen: Secondary | ICD-10-CM | POA: Diagnosis not present

## 2021-11-17 ENCOUNTER — Other Ambulatory Visit: Payer: Self-pay

## 2021-11-17 ENCOUNTER — Ambulatory Visit (INDEPENDENT_AMBULATORY_CARE_PROVIDER_SITE_OTHER): Payer: Medicare PPO | Admitting: Family

## 2021-11-17 ENCOUNTER — Encounter: Payer: Self-pay | Admitting: Family

## 2021-11-17 VITALS — BP 150/98 | HR 90 | Temp 96.8°F | Ht 65.0 in | Wt 184.0 lb

## 2021-11-17 DIAGNOSIS — K580 Irritable bowel syndrome with diarrhea: Secondary | ICD-10-CM

## 2021-11-17 DIAGNOSIS — J301 Allergic rhinitis due to pollen: Secondary | ICD-10-CM | POA: Diagnosis not present

## 2021-11-17 DIAGNOSIS — K529 Noninfective gastroenteritis and colitis, unspecified: Secondary | ICD-10-CM

## 2021-11-17 DIAGNOSIS — I10 Essential (primary) hypertension: Secondary | ICD-10-CM

## 2021-11-17 DIAGNOSIS — K219 Gastro-esophageal reflux disease without esophagitis: Secondary | ICD-10-CM

## 2021-11-17 DIAGNOSIS — Z1231 Encounter for screening mammogram for malignant neoplasm of breast: Secondary | ICD-10-CM | POA: Diagnosis not present

## 2021-11-17 DIAGNOSIS — E7800 Pure hypercholesterolemia, unspecified: Secondary | ICD-10-CM

## 2021-11-17 DIAGNOSIS — J4520 Mild intermittent asthma, uncomplicated: Secondary | ICD-10-CM

## 2021-11-17 DIAGNOSIS — J309 Allergic rhinitis, unspecified: Secondary | ICD-10-CM

## 2021-11-17 DIAGNOSIS — Z803 Family history of malignant neoplasm of breast: Secondary | ICD-10-CM

## 2021-11-17 DIAGNOSIS — E559 Vitamin D deficiency, unspecified: Secondary | ICD-10-CM

## 2021-11-17 DIAGNOSIS — M62830 Muscle spasm of back: Secondary | ICD-10-CM

## 2021-11-17 DIAGNOSIS — M8580 Other specified disorders of bone density and structure, unspecified site: Secondary | ICD-10-CM

## 2021-11-17 DIAGNOSIS — I6522 Occlusion and stenosis of left carotid artery: Secondary | ICD-10-CM

## 2021-11-17 LAB — COMPREHENSIVE METABOLIC PANEL
ALT: 24 U/L (ref 0–35)
AST: 25 U/L (ref 0–37)
Albumin: 4.2 g/dL (ref 3.5–5.2)
Alkaline Phosphatase: 106 U/L (ref 39–117)
BUN: 19 mg/dL (ref 6–23)
CO2: 30 mEq/L (ref 19–32)
Calcium: 9.6 mg/dL (ref 8.4–10.5)
Chloride: 104 mEq/L (ref 96–112)
Creatinine, Ser: 1 mg/dL (ref 0.40–1.20)
GFR: 59.99 mL/min — ABNORMAL LOW (ref >60.00)
Glucose, Bld: 83 mg/dL (ref 70–99)
Potassium: 4.6 mEq/L (ref 3.5–5.1)
Sodium: 141 mEq/L (ref 135–145)
Total Bilirubin: 1 mg/dL (ref 0.2–1.2)
Total Protein: 6.7 g/dL (ref 6.0–8.3)

## 2021-11-17 LAB — CBC WITH DIFFERENTIAL/PLATELET
Basophils Absolute: 0 10*3/uL (ref 0.0–0.1)
Basophils Relative: 0.3 % (ref 0.0–3.0)
Eosinophils Absolute: 0.1 10*3/uL (ref 0.0–0.7)
Eosinophils Relative: 2.7 % (ref 0.0–5.0)
HCT: 43.3 % (ref 36.0–46.0)
Hemoglobin: 13.9 g/dL (ref 12.0–15.0)
Lymphocytes Relative: 23.6 % (ref 12.0–46.0)
Lymphs Abs: 1.1 10*3/uL (ref 0.7–4.0)
MCHC: 32 g/dL (ref 30.0–36.0)
MCV: 95.2 fl (ref 78.0–100.0)
Monocytes Absolute: 0.3 10*3/uL (ref 0.1–1.0)
Monocytes Relative: 6.9 % (ref 3.0–12.0)
Neutro Abs: 3 10*3/uL (ref 1.4–7.7)
Neutrophils Relative %: 66.5 % (ref 43.0–77.0)
Platelets: 198 10*3/uL (ref 150.0–400.0)
RBC: 4.54 Mil/uL (ref 3.87–5.11)
RDW: 14 % (ref 11.5–15.5)
WBC: 4.5 10*3/uL (ref 4.0–10.5)

## 2021-11-17 LAB — LIPID PANEL
Cholesterol: 138 mg/dL (ref 0–200)
HDL: 52.5 mg/dL (ref >39.00)
LDL Cholesterol: 66 mg/dL (ref 0–99)
NonHDL: 85.47
Total CHOL/HDL Ratio: 3
Triglycerides: 97 mg/dL (ref 0.0–149.0)
VLDL: 19.4 mg/dL (ref 0.0–40.0)

## 2021-11-17 LAB — VITAMIN D 25 HYDROXY (VIT D DEFICIENCY, FRACTURES): VITD: 24.41 ng/mL — ABNORMAL LOW (ref 30.00–100.00)

## 2021-11-17 MED ORDER — CYCLOBENZAPRINE HCL 10 MG PO TABS: 10.0000 mg | ORAL_TABLET | Freq: Three times a day (TID) | ORAL | 0 refills | Status: DC | PRN

## 2021-11-17 MED ORDER — LOSARTAN POTASSIUM 50 MG PO TABS: 50.0000 mg | ORAL_TABLET | Freq: Every day | ORAL | 0 refills | Status: DC

## 2021-11-17 MED ORDER — ATORVASTATIN CALCIUM 20 MG PO TABS: 20.0000 mg | ORAL_TABLET | Freq: Every day | ORAL | 1 refills | Status: DC

## 2021-11-17 MED ORDER — VENTOLIN HFA 108 (90 BASE) MCG/ACT IN AERS: INHALATION_SPRAY | RESPIRATORY_TRACT | 0 refills | Status: DC

## 2021-11-17 NOTE — Assessment & Plan Note (Signed)
Patient states that she was given this before she was taking 3 times a day for chronic low back pain.  I advised patient that if she is having this much need for Flexeril when she needs to get back with the spinal surgeon.  She states she will take this into consideration and let me know if this is necessary.  I have refilled a small amount of Flexeril for patient to use with muscle spasms and/or increased pain however advised her not to take daily if she can avoid it.

## 2021-11-17 NOTE — Progress Notes (Deleted)
Established Patient Office Visit  Subjective:  Patient ID: Heather Beasley, female    DOB: 12-23-1955  Age: 66 y.o. MRN: [id number]  CC:  Chief Complaint  Patient presents with   Transitions Of Care    HPI Heather Beasley is here today with c/o    Past Medical History:  Diagnosis Date   Allergic rhinitis    Arthritis    Asthma    Cancer of nasal cavity and sinus ([place]) 07/07/2015   radiation   Cervical spondylosis    Colitis    Colon polyps    Depression    Diverticulosis    GERD (gastroesophageal reflux disease)    Glaucoma    Headaches, cluster    Hyperlipidemia    IBS (irritable bowel syndrome)    Kidney stones    Melanoma ([place])    above the L knee    Neuropathy    PONV (postoperative nausea and vomiting)    Tobacco abuse     Past Surgical History:  Procedure Laterality Date   [place]     CAROTID PTA/STENT INTERVENTION Left 06/27/2020   Procedure: CAROTID PTA/STENT INTERVENTION;  Surgeon: Algernon Huxley, MD;  Location: [facility];  Service: Cardiovascular;  Laterality: Left;   CERVICAL FUSION  2010   CHOLECYSTECTOMY  08/2011   Dr Rochel Brome   COLONOSCOPY     Dr Tiffany Kocher   CYSTOSCOPY     Dr. Bernardo Heater   DILATION AND CURETTAGE OF UTERUS     ESOPHAGOGASTRODUODENOSCOPY  2012   Dr Tiffany Kocher   FRACTURE SURGERY  2001   plate in right wrist and arm   IMAGE GUIDED SINUS SURGERY N/A 06/25/2015   Procedure: IMAGE GUIDED SINUS SURGERY;  Surgeon: Beverly Gust, MD;  Location: [facility];  Service: ENT;  Laterality: N/A;   LUMBAR SPINE SURGERY  07/2004   OOPHORECTOMY     POLYPECTOMY N/A 06/25/2015   Procedure: POLYPECTOMY NASAL;  Surgeon: Beverly Gust, MD;  Location: [facility];  Service: ENT;  Laterality: N/A;   [place]     x2    Family History  Problem Relation Age of Onset   Hypertension Mother    Hyperlipidemia Mother    Diabetes Mother     Colon cancer Father    Pancreatic cancer Father    Cancer Father        Colon & Pancreatic   Breast cancer Paternal Grandmother 48   Diabetes Paternal Grandmother    Coronary artery disease Paternal Grandmother    Heart failure Paternal Grandmother    Cancer Paternal Grandmother        breast   Stroke Brother     Social History   Socioeconomic History   Marital status: Married    Spouse name: Not on file   Number of children: 2   Years of education: Not on file   Highest education level: Not on file  Occupational History   Occupation: Scientist, water quality  Tobacco Use   Smoking status: Every Day    Packs/day: 0.25    Years: 39.00    Pack years: 9.75    Types: Cigarettes   Smokeless tobacco: Never   Tobacco comments:    e cigs currently--trying to quit  Substance and Sexual Activity   Alcohol use: No   Drug use: No   Sexual activity: Not Currently  Partners: Male  Other Topics Concern   Not on file  Social History Narrative   Lives in [place] with husband. Dog in home. Work - disabled for neck and back pain.   Social Determinants of Health   Financial Resource Strain: Not on file  Food Insecurity: Not on file  Transportation Needs: Not on file  Physical Activity: Not on file  Stress: Not on file  Social Connections: Not on file  Intimate Partner Violence: Not on file    Outpatient Medications Prior to Visit  Medication Sig Dispense Refill   atorvastatin (LIPITOR) 20 MG tablet Take 1 tablet (20 mg total) by mouth daily. 90 tablet 3   cetirizine (ZYRTEC) 10 MG tablet Take 10 mg by mouth daily. Allergies.     clopidogrel (PLAVIX) 75 MG tablet Take 1 tablet (75 mg total) by mouth daily. 90 tablet 3   colestipol (COLESTID) 1 g tablet Take by mouth.     EPINEPHrine 0.3 mg/0.3 mL IJ SOAJ injection Inject 1 mg into the skin as needed.     famotidine (PEPCID) 40 MG tablet Take 1 tablet by mouth at bedtime.     fexofenadine (ALLEGRA) 180 MG tablet Take 180 mg by mouth  daily.     ketoconazole (NIZORAL) 2 % cream Apply to aa's BID x 2 week. Then PRN thereafter. 60 g 11   loperamide (IMODIUM A-D) 2 MG tablet Take 2 mg by mouth 2 (two) times a day.     Multiple Vitamins-Minerals (CENTRUM ADULTS PO) Take 1 tablet by mouth.     nystatin (MYCOSTATIN/NYSTOP) powder Apply 1 application topically 3 (three) times daily. 15 g 0   pantoprazole (PROTONIX) 40 MG tablet Take 1 tablet (40 mg total) by mouth 2 (two) times daily. 60 tablet 11   VENTOLIN HFA 108 (90 Base) MCG/ACT inhaler INHALE 2 PUFFS INTO THE LUNGS EVERY 6 HOURS AS NEEDED FOR WHEEZING ORSHORTNESS OF BREATH 18 g 0   pantoprazole (PROTONIX) 40 MG tablet Take by mouth.     cyclobenzaprine (FLEXERIL) 10 MG tablet Take 1 tablet (10 mg total) by mouth 3 (three) times daily. 90 tablet 6   aspirin EC 81 MG tablet Take 81 mg by mouth daily.     fesoterodine (TOVIAZ) 4 MG TB24 tablet Take 1 tablet (4 mg total) by mouth daily. 90 tablet 3   No facility-administered medications prior to visit.    Allergies  Allergen Reactions   Albumin (Human) Rash   Amoxicillin-Pot Clavulanate Hives   Gabapentin Shortness Of Breath   Latex Dermatitis and Rash   Betadine [Povidone Iodine] Dermatitis, Rash and Other (See Comments)    Skin burning Topical betadine and iodine have this reaction with patient.  IV contrast is NOT a problem.  jkl   Sucralfate     Other reaction(s): Other (See Comments) Abdominal Pain   Amoxicillin     REACTION: rash   Chantix [Varenicline Tartrate]     Heart racing   Erythromycin     REACTION: rash   Sucralfate Nausea And Vomiting    Heart racing Lightheaded     ROS Review of Systems  Review of Systems  Respiratory:  Negative for shortness of breath.   Cardiovascular:  Negative for chest pain and palpitations.  Gastrointestinal:  Negative for constipation and diarrhea.  Genitourinary:  Negative for dysuria, frequency and urgency.  Musculoskeletal:  Negative for myalgias.   Psychiatric/Behavioral:  Negative for depression and suicidal ideas.   All other systems reviewed and are negative.  Objective:    Physical Exam  Gen: NAD, resting comfortably HEENT: TMs normal bilaterally. OP clear. No thyromegaly noted.  CV: RRR with no murmurs appreciated Pulm: NWOB, CTAB with no crackles, wheezes, or rhonchi GI: Normal bowel sounds present. Soft, Nontender, Nondistended. MSK: no edema, cyanosis, or clubbing noted Skin: warm, dry Psych: Normal affect and thought content  BP (!) 154/102    Pulse 90    Temp (!) 96.8 F (36 C) (Temporal)    Ht 5\' 5"  (1.651 m)    Wt 184 lb (83.5 kg)    SpO2 98%    BMI 30.62 kg/m  Wt Readings from Last 3 Encounters:  11/17/21 184 lb (83.5 kg)  05/20/21 179 lb 6.4 oz (81.4 kg)  11/20/20 182 lb (82.6 kg)     Health Maintenance Due  Topic Date Due   COVID-19 Vaccine (1) Never done   HIV Screening  Never done   Hepatitis C Screening  Never done   Zoster Vaccines- Shingrix (1 of 2) Never done   INFLUENZA VACCINE  05/26/2021   TETANUS/TDAP  08/25/2021    There are no preventive care reminders to display for this patient.  Lab Results  Component Value Date   TSH 3.466 08/23/2018   Lab Results  Component Value Date   WBC 4.4 06/28/2020   HGB 12.2 06/28/2020   HCT 37.8 06/28/2020   MCV 97.2 06/28/2020   PLT 180 06/28/2020   Lab Results  Component Value Date   NA 141 06/28/2020   K 4.2 06/28/2020   CO2 26 06/28/2020   GLUCOSE 99 06/28/2020   BUN 20 06/28/2020   CREATININE 0.93 06/28/2020   BILITOT 0.8 05/25/2019   ALKPHOS 98 05/25/2019   AST 21 05/25/2019   ALT 15 05/25/2019   PROT 7.0 05/25/2019   ALBUMIN 3.9 05/25/2019   CALCIUM 8.4 (L) 06/28/2020   ANIONGAP 5 06/28/2020   GFR 66.22 04/25/2019   Lab Results  Component Value Date   CHOL 195 06/04/2020   Lab Results  Component Value Date   HDL 50.80 06/04/2020   Lab Results  Component Value Date   LDLCALC 121 (H) 06/04/2020   Lab Results   Component Value Date   TRIG 113.0 06/04/2020   Lab Results  Component Value Date   CHOLHDL 4 06/04/2020   No results found for: HGBA1C    Assessment & Plan:   Problem List Items Addressed This Visit   None   No orders of the defined types were placed in this encounter.   Follow-up: No follow-ups on file.    Eugenia Pancoast, FNP

## 2021-11-17 NOTE — Assessment & Plan Note (Signed)
Mammogram ordered pending results

## 2021-11-17 NOTE — Assessment & Plan Note (Addendum)
Refilled statin.  Ordered lipid panel, pending results. Work on low cholesterol diet and exercise as tolerated

## 2021-11-17 NOTE — Assessment & Plan Note (Signed)
After reviewing notes in the history with her GI doctor I did see that they do see that she is taking Imodium twice daily however I feel that her diarrhea is not significantly controlled with this as she has already had 3 bowel movements this morning and it is still before noon. I have discussed with her FODMAP diet as well and recommended that she try to cut dairy completely out of her diet to see if symptom improvement.  I have referred patient back to GI to make an appointment so that they are aware that she is struggling with his diarrhea.  I have also ordered stool cultures just to rule out any other etiology.

## 2021-11-17 NOTE — Assessment & Plan Note (Signed)
Patient instructed to take vitamin D once daily and also ordered bone density scan pending her to schedule appointment.  Did advise her that smoking can also contribute to worsening osteopenia and so she is aware but not ready to quit

## 2021-11-17 NOTE — Assessment & Plan Note (Addendum)
Last visit with GI they did attempt to wean down patient from chronic PPI use especially given her osteopenic history.  She has been unsuccessful and is still taking the pantoprazole twice daily as well as now famotidine.  She states when she takes the famotidine she does have stomach cramps so I have advised her to change taking the famotidine to dinner instead of her before bed and to make sure that she drinks it with a full glass of water. Try to decrease and or avoid spicy foods, fried fatty foods, and also caffeine and chocolate as these can increase heartburn symptoms.

## 2021-11-17 NOTE — Assessment & Plan Note (Signed)
This is chronic and patient is still continuing to suffer even with Imodium twice daily.  She has treatment failure as well in the past with cholestid as well as bentyl.  I am sending her back to GI to follow-up and may be considering Linzess?  I have also ordered stool cultures and pending results.  Handout given for FODMAP diet to follow.

## 2021-11-17 NOTE — Progress Notes (Signed)
Established Patient Office Visit  Subjective:  Patient ID: Heather Beasley, female    DOB: 1956-10-13  Age: 66 y.o. MRN: [id number]  CC:  Chief Complaint  Patient presents with   Transitions Of Care    HPI Heather Beasley is here today for [facility] visit. She was previously a patient of [name], [place].   Pt is concerned about her blood pressure, has been checking at home.  She does state average around 140/150 over 90/100. This is higher than normal for her and she does state have headaches almost daily as well. Feels all over her head. Constant dull aching throughout the day. Tylenol does give her some mild relief. This has been since the part of December 2022. No lower leg edema, no cp palp and or worsening sob. She does have DOE but states this has been chronic. Doesn't really watch the salt in her diet. Not currently on any medication for blood pressure. She does have h/o chronic headaches, however she states these are different than usual.    Chronic problems addressed today:  Carotid stenosis: did use to see a cardiologist in the past for abnormal EKG, did do a stress test but no further workup had been told he released her from his care. 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 She sees  Eulogio Ditch, NP at vascular surgery, now on chronic plavix 75 mg once daily.   Acid reflux: famotidine 40 mg tablet once daily, helps some. Has noticed that if she does take it (doesn't take every night) she has stomach cramping. Takes right before she goes to bed.   Osteopenia: last bone density 03/14/2018, right femur, left femur, left forearm with osteopenia. Does not take daily calcium vitamin D. She is a chronic smoker, has been smoking for the past fifty years. She is hesitant to take calcium supplements bc she always seem to develop kidney stones.   Mammogram 05/13/20: mammogram was  negative for malignancy.   Colonoscopy: Mar 09, 2014 last on records. However states last colonoscopy was 2021 or so? Not sure. We do not have the report. She doesn't remember the results.   Chronic low back pain, states was taking flexeril prn muscle spasm/low back pain. States at one point was on this TID , ran out about two months ago. She does take about two tylenol daily for management of back pain and or from the headaches from the blood pressure being elevated.  Chronic diarrhea: states takes immodium twice daily for IBS diarrhea. She did see GI in the past (doesn't recall time frame, maybe last year?) and was prescribed a medication that was too expensive, also doesn't recall the name. She sees PA Effie Berkshire at the [place] [facility], last appt was 08/12/21. has already had three diarrhea bowel movements today. She has diarrhea every time she eats. She has a big issue with dairy which she states she is trying to avoid which helps manage the symptoms but not completely. After looking through pt chart I did see she tried bentyl in the past which caused constipation and then clolestid also but this is what I feel she reports was too expensive for her.    Past Medical History:  Diagnosis Date   Allergic rhinitis    Arthritis    Asthma    Cancer of nasal cavity and sinus ([place]) 07/07/2015   radiation  Cervical spondylosis    Colitis    Colon polyps    Depression    Diverticulosis    GERD (gastroesophageal reflux disease)    Glaucoma    Headaches, cluster    Hyperlipidemia    IBS (irritable bowel syndrome)    Kidney stones    Melanoma ([place])    above the L knee    Neuropathy    PONV (postoperative nausea and vomiting)    Tobacco abuse     Past Surgical History:  Procedure Laterality Date   [place]     CAROTID PTA/STENT INTERVENTION Left 06/27/2020   Procedure: CAROTID PTA/STENT INTERVENTION;   Surgeon: Algernon Huxley, MD;  Location: [facility];  Service: Cardiovascular;  Laterality: Left;   CERVICAL FUSION  2010   CHOLECYSTECTOMY  08/2011   Dr Rochel Brome   COLONOSCOPY     Dr Tiffany Kocher   CYSTOSCOPY     Dr. Bernardo Heater   DILATION AND CURETTAGE OF UTERUS     ESOPHAGOGASTRODUODENOSCOPY  2012   Dr Tiffany Kocher   FRACTURE SURGERY  2001   plate in right wrist and arm   IMAGE GUIDED SINUS SURGERY N/A 06/25/2015   Procedure: IMAGE GUIDED SINUS SURGERY;  Surgeon: Beverly Gust, MD;  Location: [facility];  Service: ENT;  Laterality: N/A;   LUMBAR SPINE SURGERY  07/2004   OOPHORECTOMY     POLYPECTOMY N/A 06/25/2015   Procedure: POLYPECTOMY NASAL;  Surgeon: Beverly Gust, MD;  Location: [facility];  Service: ENT;  Laterality: N/A;   [place]     x2    Family History  Problem Relation Age of Onset   Hypertension Mother    Hyperlipidemia Mother    Diabetes Mother    Colon cancer Father    Pancreatic cancer Father    Cancer Father        Colon & Pancreatic   Breast cancer Paternal Grandmother 46   Diabetes Paternal Grandmother    Coronary artery disease Paternal Grandmother    Heart failure Paternal Grandmother    Cancer Paternal Grandmother        breast   Stroke Brother     Social History   Socioeconomic History   Marital status: Married    Spouse name: Not on file   Number of children: 2   Years of education: Not on file   Highest education level: Not on file  Occupational History   Occupation: Scientist, water quality  Tobacco Use   Smoking status: Every Day    Packs/day: 0.25    Years: 39.00    Pack years: 9.75    Types: Cigarettes   Smokeless tobacco: Never   Tobacco comments:    e cigs currently--trying to quit  Substance and Sexual Activity   Alcohol use: No   Drug use: No   Sexual activity: Not Currently    Partners: Male  Other Topics Concern   Not on file  Social History Narrative   Lives in [place] with husband. Dog in home. Work  - disabled for neck and back pain.   Social Determinants of Health   Financial Resource Strain: Not on file  Food Insecurity: Not on file  Transportation Needs: Not on file  Physical Activity: Not on file  Stress: Not on file  Social Connections: Not on file  Intimate Partner Violence: Not on file  Outpatient Medications Prior to Visit  Medication Sig Dispense Refill   cetirizine (ZYRTEC) 10 MG tablet Take 10 mg by mouth daily. Allergies.     clopidogrel (PLAVIX) 75 MG tablet Take 1 tablet (75 mg total) by mouth daily. 90 tablet 3   EPINEPHrine 0.3 mg/0.3 mL IJ SOAJ injection Inject 1 mg into the skin as needed.     famotidine (PEPCID) 40 MG tablet Take 1 tablet by mouth at bedtime.     loperamide (IMODIUM A-D) 2 MG tablet Take 2 mg by mouth 2 (two) times a day.     Multiple Vitamins-Minerals (CENTRUM ADULTS PO) Take 1 tablet by mouth.     pantoprazole (PROTONIX) 40 MG tablet Take 1 tablet (40 mg total) by mouth 2 (two) times daily. 60 tablet 11   atorvastatin (LIPITOR) 20 MG tablet Take 1 tablet (20 mg total) by mouth daily. 90 tablet 3   colestipol (COLESTID) 1 g tablet Take by mouth.     fexofenadine (ALLEGRA) 180 MG tablet Take 180 mg by mouth daily.     ketoconazole (NIZORAL) 2 % cream Apply to aa's BID x 2 week. Then PRN thereafter. 60 g 11   nystatin (MYCOSTATIN/NYSTOP) powder Apply 1 application topically 3 (three) times daily. 15 g 0   pantoprazole (PROTONIX) 40 MG tablet Take by mouth.     VENTOLIN HFA 108 (90 Base) MCG/ACT inhaler INHALE 2 PUFFS INTO THE LUNGS EVERY 6 HOURS AS NEEDED FOR WHEEZING ORSHORTNESS OF BREATH 18 g 0   aspirin EC 81 MG tablet Take 81 mg by mouth daily.     cyclobenzaprine (FLEXERIL) 10 MG tablet Take 1 tablet (10 mg total) by mouth 3 (three) times daily. 90 tablet 6   fesoterodine (TOVIAZ) 4 MG TB24 tablet Take 1 tablet (4 mg total) by mouth daily. 90 tablet 3   No facility-administered medications prior to visit.    Allergies  Allergen  Reactions   Albumin (Human) Rash   Amoxicillin-Pot Clavulanate Hives   Gabapentin Shortness Of Breath   Latex Dermatitis and Rash   Betadine [Povidone Iodine] Dermatitis, Rash and Other (See Comments)    Skin burning Topical betadine and iodine have this reaction with patient.  IV contrast is NOT a problem.  jkl   Sucralfate     Other reaction(s): Other (See Comments) Abdominal Pain   Amoxicillin     REACTION: rash   Chantix [Varenicline Tartrate]     Heart racing   Erythromycin     REACTION: rash   Sucralfate Nausea And Vomiting    Heart racing Lightheaded     ROS Review of Systems  Constitutional:  Negative for chills, fatigue and fever.  Eyes:  Negative for photophobia, pain and visual disturbance.  Respiratory:  Negative for cough, shortness of breath and wheezing.   Cardiovascular:  Negative for chest pain, palpitations and leg swelling.  Gastrointestinal:  Positive for diarrhea. Negative for abdominal pain, blood in stool, constipation, nausea and vomiting.       Heartburn at times   Skin:  Negative for color change.  Neurological:  Positive for headaches (with increased blood pressure).       Objective:    Physical Exam Constitutional:      General: She is not in acute distress.    Appearance: Normal appearance. She is obese. She is not ill-appearing, toxic-appearing or diaphoretic.  HENT:     Head: Normocephalic.  Cardiovascular:     Rate and Rhythm: Normal rate and regular rhythm.  Pulmonary:     Effort: Pulmonary effort is normal.     Breath sounds: Normal breath sounds.  Abdominal:     General: Abdomen is flat.     Tenderness: There is no abdominal tenderness.  Musculoskeletal:        General: Normal range of motion.  Skin:    General: Skin is warm.  Neurological:     General: No focal deficit present.     Mental Status: She is alert and oriented to person, place, and time.  Psychiatric:        Mood and Affect: Mood normal.        Behavior:  Behavior normal.        Thought Content: Thought content normal.        Judgment: Judgment normal.      BP (!) 150/98    Pulse 90    Temp (!) 96.8 F (36 C) (Temporal)    Ht 5\' 5"  (1.651 m)    Wt 184 lb (83.5 kg)    SpO2 98%    BMI 30.62 kg/m  Wt Readings from Last 3 Encounters:  11/17/21 184 lb (83.5 kg)  05/20/21 179 lb 6.4 oz (81.4 kg)  11/20/20 182 lb (82.6 kg)     Health Maintenance Due  Topic Date Due   COVID-19 Vaccine (1) Never done   HIV Screening  Never done   Hepatitis C Screening  Never done   Zoster Vaccines- Shingrix (1 of 2) Never done   INFLUENZA VACCINE  05/26/2021   TETANUS/TDAP  08/25/2021    There are no preventive care reminders to display for this patient.  Lab Results  Component Value Date   TSH 3.466 08/23/2018   Lab Results  Component Value Date   WBC 4.4 06/28/2020   HGB 12.2 06/28/2020   HCT 37.8 06/28/2020   MCV 97.2 06/28/2020   PLT 180 06/28/2020   Lab Results  Component Value Date   NA 141 06/28/2020   K 4.2 06/28/2020   CO2 26 06/28/2020   GLUCOSE 99 06/28/2020   BUN 20 06/28/2020   CREATININE 0.93 06/28/2020   BILITOT 0.8 05/25/2019   ALKPHOS 98 05/25/2019   AST 21 05/25/2019   ALT 15 05/25/2019   PROT 7.0 05/25/2019   ALBUMIN 3.9 05/25/2019   CALCIUM 8.4 (L) 06/28/2020   ANIONGAP 5 06/28/2020   GFR 66.22 04/25/2019   Lab Results  Component Value Date   CHOL 195 06/04/2020   Lab Results  Component Value Date   HDL 50.80 06/04/2020   Lab Results  Component Value Date   LDLCALC 121 (H) 06/04/2020   Lab Results  Component Value Date   TRIG 113.0 06/04/2020   Lab Results  Component Value Date   CHOLHDL 4 06/04/2020   No results found for: HGBA1C    Assessment & Plan:   Problem List Items Addressed This Visit       Cardiovascular and Mediastinum   Essential hypertension, benign    New diagnosis of hypertension in the office as patient has had chronic elevations as well at home without home blood  pressure monitoring.  Patient to be started on losartan 50 mg once daily and patient to obtain blood pressure log over the next 2 to 3 weeks and follow-up in 1 month with a blood pressure log.  I am hopeful that the blood pressure control will help reduce the frequency of her headaches that she is currently experiencing however I have also advised her  to keep a headache diary for follow-up as well.  Keep a low-sodium diet.      Relevant Medications   losartan (COZAAR) 50 MG tablet   atorvastatin (LIPITOR) 20 MG tablet   Carotid stenosis, left    Continue follow-up with vascular surgeon.  Continue Plavix daily.      Relevant Medications   losartan (COZAAR) 50 MG tablet   atorvastatin (LIPITOR) 20 MG tablet   RESOLVED: Hypertension   Relevant Medications   losartan (COZAAR) 50 MG tablet   atorvastatin (LIPITOR) 20 MG tablet   Other Relevant Orders   Microalbumin / creatinine urine ratio     Respiratory   Asthma, intermittent, uncomplicated   Relevant Medications   VENTOLIN HFA 108 (90 Base) MCG/ACT inhaler   Allergic rhinitis     Digestive   GERD (gastroesophageal reflux disease)    Last visit with GI they did attempt to wean down patient from chronic PPI use especially given her osteopenic history.  She has been unsuccessful and is still taking the pantoprazole twice daily as well as now famotidine.  She states when she takes the famotidine she does have stomach cramps so I have advised her to change taking the famotidine to dinner instead of her before bed and to make sure that she drinks it with a full glass of water. Try to decrease and or avoid spicy foods, fried fatty foods, and also caffeine and chocolate as these can increase heartburn symptoms.        Relevant Medications   famotidine (PEPCID) 40 MG tablet   Irritable bowel syndrome with diarrhea    This is chronic and patient is still continuing to suffer even with Imodium twice daily.  She has treatment failure as well in  the past with cholestid as well as bentyl.  I am sending her back to GI to follow-up and may be considering Linzess?  I have also ordered stool cultures and pending results.  Handout given for FODMAP diet to follow.      Relevant Medications   famotidine (PEPCID) 40 MG tablet   Other Relevant Orders   CBC w/Diff   Chronic diarrhea - Primary    After reviewing notes in the history with her GI doctor I did see that they do see that she is taking Imodium twice daily however I feel that her diarrhea is not significantly controlled with this as she has already had 3 bowel movements this morning and it is still before noon. I have discussed with her FODMAP diet as well and recommended that she try to cut dairy completely out of her diet to see if symptom improvement.  I have referred patient back to GI to make an appointment so that they are aware that she is struggling with his diarrhea.  I have also ordered stool cultures just to rule out any other etiology.      Relevant Orders   Ambulatory referral to Gastroenterology   Comprehensive metabolic panel   Ova and parasite examination   Clostridium Difficile by PCR([facility])   Salmonella/Shigella Cult, Campy EIA and Shiga Toxin reflex     Musculoskeletal and Integument   Osteopenia    Patient instructed to take vitamin D once daily and also ordered bone density scan pending her to schedule appointment.  Did advise her that smoking can also contribute to worsening osteopenia and so she is aware but not ready to quit        Other   Vitamin D deficiency  Ordered vitamin D pending results.      Relevant Orders   VITAMIN D 25 Hydroxy (Vit-D Deficiency, Fractures)   Encounter for screening mammogram for malignant neoplasm of breast    Mammogram ordered pending results      Relevant Orders   MM 3D SCREEN BREAST BILATERAL   HLD (hyperlipidemia)    Refilled statin.  Ordered lipid panel, pending results. Work on low cholesterol diet  and exercise as tolerated       Relevant Medications   losartan (COZAAR) 50 MG tablet   atorvastatin (LIPITOR) 20 MG tablet   Other Relevant Orders   Lipid Profile   Family history of breast cancer in female   Relevant Orders   MM 3D SCREEN BREAST BILATERAL   Muscle spasm of back    Patient states that she was given this before she was taking 3 times a day for chronic low back pain.  I advised patient that if she is having this much need for Flexeril when she needs to get back with the spinal surgeon.  She states she will take this into consideration and let me know if this is necessary.  I have refilled a small amount of Flexeril for patient to use with muscle spasms and/or increased pain however advised her not to take daily if she can avoid it.      Relevant Medications   cyclobenzaprine (FLEXERIL) 10 MG tablet    Meds ordered this encounter  Medications   losartan (COZAAR) 50 MG tablet    Sig: Take 1 tablet (50 mg total) by mouth daily.    Dispense:  90 tablet    Refill:  0    Order Specific Question:   Supervising Provider    Answer:   BEDSOLE, AMY E [[id number]]   cyclobenzaprine (FLEXERIL) 10 MG tablet    Sig: Take 1 tablet (10 mg total) by mouth 3 (three) times daily as needed for muscle spasms.    Dispense:  30 tablet    Refill:  0    Order Specific Question:   Supervising Provider    Answer:   BEDSOLE, AMY E [[id number]]   atorvastatin (LIPITOR) 20 MG tablet    Sig: Take 1 tablet (20 mg total) by mouth daily.    Dispense:  90 tablet    Refill:  1    Order Specific Question:   Supervising Provider    Answer:   BEDSOLE, AMY E [[id number]]   VENTOLIN HFA 108 (90 Base) MCG/ACT inhaler    Sig: INHALE 2 PUFFS INTO THE LUNGS EVERY 6 HOURS AS NEEDED FOR WHEEZING ORSHORTNESS OF BREATH    Dispense:  18 g    Refill:  0    Pt must schedule physical exam    Order Specific Question:   Supervising Provider    Answer:   Diona Browner, AMY E [[id number]]    Follow-up: Return in about 1 month (around  12/18/2021) for with blood pressure log.    Eugenia Pancoast, FNP

## 2021-11-17 NOTE — Assessment & Plan Note (Signed)
New diagnosis of hypertension in the office as patient has had chronic elevations as well at home without home blood pressure monitoring.  Patient to be started on losartan 50 mg once daily and patient to obtain blood pressure log over the next 2 to 3 weeks and follow-up in 1 month with a blood pressure log.  I am hopeful that the blood pressure control will help reduce the frequency of her headaches that she is currently experiencing however I have also advised her to keep a headache diary for follow-up as well.  Keep a low-sodium diet.

## 2021-11-17 NOTE — Assessment & Plan Note (Signed)
Ordered vitamin D pending results.

## 2021-11-17 NOTE — Patient Instructions (Addendum)
I have started you on a blood pressure medication, which I want you to take one pill once daily and monitor blood pressure levels.  Start monitoring your blood pressure daily, around the same time of day, for the next 2-3 weeks.  Ensure that you have rested for 30 minutes prior to checking your blood pressure. Record your readings and bring them to your next visit.  Stop by the lab prior to leaving today. I will notify you of your results once received.   Please call your GI and schedule a follow up appointment for ongoing diarrhea, as twice daily immodium is not desired and quality of life is affected by diarrhea. I have also ordered some stool cards that are pending.  Call [facility] [facility] to schedule your mammogram as I have sent the electronic order to their facility.  Here is their number : [phone]   I have ordered a bone density at [facility]. This order has been sent over for you electronically.  Please call [phone] to schedule this appointment.  It was a pleasure seeing you today! Please do not hesitate to reach out with any questions and or concerns.  Regards,   Eugenia Pancoast FNP-C

## 2021-11-17 NOTE — Assessment & Plan Note (Signed)
Continue follow-up with vascular surgeon.  Continue Plavix daily.

## 2021-11-18 ENCOUNTER — Other Ambulatory Visit: Payer: Self-pay | Admitting: Family

## 2021-11-18 DIAGNOSIS — K529 Noninfective gastroenteritis and colitis, unspecified: Secondary | ICD-10-CM | POA: Diagnosis not present

## 2021-11-18 LAB — MICROALBUMIN / CREATININE URINE RATIO
Creatinine,U: 24.7 mg/dL
Microalb Creat Ratio: 2.8 mg/g (ref 0.0–30.0)
Microalb, Ur: <0.7 mg/dL (ref 0.0–1.9)

## 2021-11-20 LAB — SALMONELLA/SHIGELLA CULT, CAMPY EIA AND SHIGA TOXIN RFL ECOLI
MICRO NUMBER: 12911948
MICRO NUMBER:: 12911949
MICRO NUMBER:: 12911950
Result:: NOT DETECTED
SHIGA RESULT:: NOT DETECTED
SPECIMEN QUALITY: ADEQUATE
SPECIMEN QUALITY:: ADEQUATE
SPECIMEN QUALITY:: ADEQUATE

## 2021-11-20 LAB — OVA AND PARASITE EXAMINATION
CONCENTRATE RESULT:: NONE SEEN
MICRO NUMBER:: 12911947
SPECIMEN QUALITY:: ADEQUATE
TRICHROME RESULT:: NONE SEEN

## 2021-11-20 LAB — CLOSTRIDIUM DIFFICILE BY PCR: Toxigenic C. Difficile by PCR: NEGATIVE

## 2021-11-24 DIAGNOSIS — J301 Allergic rhinitis due to pollen: Secondary | ICD-10-CM | POA: Diagnosis not present

## 2021-11-28 ENCOUNTER — Encounter: Payer: Self-pay | Admitting: Family

## 2021-12-01 ENCOUNTER — Other Ambulatory Visit: Payer: Self-pay | Admitting: Family

## 2021-12-01 DIAGNOSIS — M8580 Other specified disorders of bone density and structure, unspecified site: Secondary | ICD-10-CM

## 2021-12-03 DIAGNOSIS — R059 Cough, unspecified: Secondary | ICD-10-CM | POA: Diagnosis not present

## 2021-12-03 DIAGNOSIS — J0140 Acute pansinusitis, unspecified: Secondary | ICD-10-CM | POA: Diagnosis not present

## 2021-12-13 DIAGNOSIS — R0981 Nasal congestion: Secondary | ICD-10-CM | POA: Diagnosis not present

## 2021-12-13 DIAGNOSIS — J019 Acute sinusitis, unspecified: Secondary | ICD-10-CM | POA: Diagnosis not present

## 2021-12-18 ENCOUNTER — Encounter: Payer: Self-pay | Admitting: Family

## 2021-12-18 ENCOUNTER — Other Ambulatory Visit: Payer: Self-pay

## 2021-12-18 ENCOUNTER — Ambulatory Visit (INDEPENDENT_AMBULATORY_CARE_PROVIDER_SITE_OTHER)
Admission: RE | Admit: 2021-12-18 | Discharge: 2021-12-18 | Disposition: A | Discharge status: Home / Self Care | Payer: Medicare PPO | Source: Ambulatory Visit | Attending: Family | Admitting: Family

## 2021-12-18 ENCOUNTER — Ambulatory Visit (INDEPENDENT_AMBULATORY_CARE_PROVIDER_SITE_OTHER): Payer: Medicare PPO | Admitting: Family

## 2021-12-18 VITALS — BP 130/82 | HR 74 | Ht 65.0 in | Wt 180.0 lb

## 2021-12-18 DIAGNOSIS — Z8616 Personal history of COVID-19: Secondary | ICD-10-CM

## 2021-12-18 DIAGNOSIS — B370 Candidal stomatitis: Secondary | ICD-10-CM | POA: Diagnosis not present

## 2021-12-18 DIAGNOSIS — R0602 Shortness of breath: Secondary | ICD-10-CM | POA: Diagnosis not present

## 2021-12-18 DIAGNOSIS — J301 Allergic rhinitis due to pollen: Secondary | ICD-10-CM

## 2021-12-18 DIAGNOSIS — J45901 Unspecified asthma with (acute) exacerbation: Secondary | ICD-10-CM | POA: Insufficient documentation

## 2021-12-18 DIAGNOSIS — J441 Chronic obstructive pulmonary disease with (acute) exacerbation: Secondary | ICD-10-CM

## 2021-12-18 DIAGNOSIS — I10 Essential (primary) hypertension: Secondary | ICD-10-CM | POA: Diagnosis not present

## 2021-12-18 MED ORDER — LOSARTAN POTASSIUM 100 MG PO TABS: 100.0000 mg | ORAL_TABLET | Freq: Every day | ORAL | 1 refills | Status: DC

## 2021-12-18 MED ORDER — FLUTICASONE-SALMETEROL 250-50 MCG/ACT IN AEPB: 1.0000 | INHALATION_SPRAY | Freq: Two times a day (BID) | RESPIRATORY_TRACT | 1 refills | Status: DC

## 2021-12-18 MED ORDER — NYSTATIN 100000 UNIT/ML MT SUSP
5.0000 mL | Freq: Four times a day (QID) | OROMUCOSAL | 0 refills | Status: AC
Start: 2021-12-18 — End: 2021-12-25

## 2021-12-18 NOTE — Assessment & Plan Note (Signed)
Increase losartan to 100 mg once daily from 50 mg. Pt advised of the following:  Monitor blood pressure periodically and/or when you feel symptomatic. Goal is <130/90 on average. Ensure that you have rested for 30 minutes prior to checking your blood pressure. Record your readings and bring them to your next visit if necessary.work on a low sodium diet.

## 2021-12-18 NOTE — Assessment & Plan Note (Signed)
Addition of Advair to treatment plan patient to start taking 1 puff twice daily.  See if this helps with the exacerbation.  Also ordering chest x-ray to rule out other etiologies however with long duration of Levaquin therapy unlikely pneumonia.  Continue with Flonase as well.  Patient finishing up prednisone.  Advised to monitor for any tendinitis concerns as she was placed on Levaquin and prednisone which increases this risk (was put on prednisone by urgent care)

## 2021-12-18 NOTE — Assessment & Plan Note (Signed)
Continue with Flonase

## 2021-12-18 NOTE — Assessment & Plan Note (Signed)
Nystatin prescribed patient to take as indicated advised to wash her mouth after every use of an inhaler as well

## 2021-12-18 NOTE — Patient Instructions (Addendum)
Complete xray(s) prior to leaving today. I will notify you of your results once received.   We have sent in an inhaler that I want you to start it 1 puff twice daily.  Hopefully this will help with your COPD exacerbation.  We are also obtaining a chest x-ray today that I will call you about when we get results.  Unfortunately you were also found to have thrush in your tongue I want you to take nystatin as directed I have also sent this to the pharmacy.  If you do begin to have any worsening shortness of breath or chest pain please immediately go to the emergency room and/or call 911.  Continue with over-the-counter Flonase as well as advised is helpful for the ear fluid.   Follow-up with ENT next week as scheduled as well  Increase your losartan to 100 mg once daily and trakc blood pressure, bring log to next visit.   It was a pleasure seeing you today! Please do not hesitate to reach out with any questions and or concerns.  Regards,   Eugenia Pancoast FNP-C

## 2021-12-18 NOTE — Progress Notes (Signed)
Established Patient Office Visit  Subjective:  Patient ID: Heather Beasley, female    DOB: December 07, 1955  Age: 66 y.o. MRN: [id number]  CC:  Chief Complaint  Patient presents with   Hypertension    Follow up--pt stated took prednisone 2 days ago.    HPI Heather Beasley is here today for follow up.  Pt is without acute concerns.  Covid, tested positive , sleeping on three pillows and with lingering sob and fatigue. Still with pnd. No current cough. No nasal congestion but some sinus pressure behind both eyes. Just completed levaquin 750 mg once daily for 14 days worth from ENT. Also took tessalon perrles with some relief of the cough with that.finished levaquin yesterday was her last pill.   Htn: pt has been keeping log of blood pressure, average is around 145-160 over 80-100. Denies chest pain palp but with sob ([place] covid). Currently on losartan 50 mg once daily .   Past Medical History:  Diagnosis Date   Allergic rhinitis    Arthritis    Asthma    Cancer of nasal cavity and sinus ([place]) 07/07/2015   radiation   Cervical spondylosis    Colitis    Colon polyps    Depression    Diverticulosis    GERD (gastroesophageal reflux disease)    Glaucoma    Headaches, cluster    Hyperlipidemia    IBS (irritable bowel syndrome)    Kidney stones    Melanoma ([place])    above the L knee    Neuropathy    PONV (postoperative nausea and vomiting)    Tobacco abuse     Past Surgical History:  Procedure Laterality Date   [place]     CAROTID PTA/STENT INTERVENTION Left 06/27/2020   Procedure: CAROTID PTA/STENT INTERVENTION;  Surgeon: Algernon Huxley, MD;  Location: [facility];  Service: Cardiovascular;  Laterality: Left;   CERVICAL FUSION  2010   CHOLECYSTECTOMY  08/2011   Dr Rochel Brome   COLONOSCOPY     Dr Tiffany Kocher   CYSTOSCOPY     Dr. Bernardo Heater   DILATION AND CURETTAGE OF UTERUS      ESOPHAGOGASTRODUODENOSCOPY  2012   Dr Tiffany Kocher   FRACTURE SURGERY  2001   plate in right wrist and arm   IMAGE GUIDED SINUS SURGERY N/A 06/25/2015   Procedure: IMAGE GUIDED SINUS SURGERY;  Surgeon: Beverly Gust, MD;  Location: [facility];  Service: ENT;  Laterality: N/A;   LUMBAR SPINE SURGERY  07/2004   OOPHORECTOMY     POLYPECTOMY N/A 06/25/2015   Procedure: POLYPECTOMY NASAL;  Surgeon: Beverly Gust, MD;  Location: [facility];  Service: ENT;  Laterality: N/A;   [place]     x2    Family History  Problem Relation Age of Onset   Hypertension Mother    Hyperlipidemia Mother    Diabetes Mother    Colon cancer Father    Pancreatic cancer Father    Cancer Father        Colon & Pancreatic   Breast cancer Paternal Grandmother 64   Diabetes Paternal Grandmother    Coronary artery disease Paternal Grandmother    Heart failure Paternal Grandmother    Cancer Paternal Grandmother        breast   Stroke Brother     Social History   Socioeconomic History  Marital status: Married    Spouse name: Not on file   Number of children: 2   Years of education: Not on file   Highest education level: Not on file  Occupational History   Occupation: Scientist, water quality  Tobacco Use   Smoking status: Every Day    Packs/day: 0.25    Years: 39.00    Pack years: 9.75    Types: Cigarettes   Smokeless tobacco: Never   Tobacco comments:    e cigs currently--trying to quit  Substance and Sexual Activity   Alcohol use: No   Drug use: No   Sexual activity: Not Currently    Partners: Male  Other Topics Concern   Not on file  Social History Narrative   Lives in [place] with husband. Dog in home. Work - disabled for neck and back pain.   Social Determinants of Health   Financial Resource Strain: Not on file  Food Insecurity: Not on file  Transportation Needs: Not on file  Physical Activity: Not on file  Stress: Not on file  Social Connections: Not on file  Intimate  Partner Violence: Not on file    Outpatient Medications Prior to Visit  Medication Sig Dispense Refill   albuterol (VENTOLIN HFA) 108 (90 Base) MCG/ACT inhaler Inhale into the lungs every 6 (six) hours as needed.     atorvastatin (LIPITOR) 20 MG tablet Take 1 tablet (20 mg total) by mouth daily. 90 tablet 1   cetirizine (ZYRTEC) 10 MG tablet Take 10 mg by mouth daily. Allergies.     clopidogrel (PLAVIX) 75 MG tablet Take 1 tablet (75 mg total) by mouth daily. 90 tablet 3   cyclobenzaprine (FLEXERIL) 10 MG tablet Take 1 tablet (10 mg total) by mouth 3 (three) times daily as needed for muscle spasms. 30 tablet 0   EPINEPHrine 0.3 mg/0.3 mL IJ SOAJ injection Inject 1 mg into the skin as needed.     famotidine (PEPCID) 40 MG tablet Take 1 tablet by mouth at bedtime.     loperamide (IMODIUM A-D) 2 MG tablet Take 2 mg by mouth 2 (two) times a day.     Multiple Vitamins-Minerals (CENTRUM ADULTS PO) Take 1 tablet by mouth.     pantoprazole (PROTONIX) 40 MG tablet Take 1 tablet (40 mg total) by mouth 2 (two) times daily. 60 tablet 11   predniSONE (DELTASONE) 20 MG tablet Take 20 mg by mouth. Take two tablets once daily for five days     VENTOLIN HFA 108 (90 Base) MCG/ACT inhaler INHALE 2 PUFFS INTO THE LUNGS EVERY 6 HOURS AS NEEDED FOR WHEEZING ORSHORTNESS OF BREATH 18 g 0   losartan (COZAAR) 50 MG tablet Take 1 tablet (50 mg total) by mouth daily. 90 tablet 0   No facility-administered medications prior to visit.    Allergies  Allergen Reactions   Albumin (Human) Rash   Amoxicillin-Pot Clavulanate Hives   Gabapentin Shortness Of Breath   Latex Dermatitis and Rash   Betadine [Povidone Iodine] Dermatitis, Rash and Other (See Comments)    Skin burning Topical betadine and iodine have this reaction with patient.  IV contrast is NOT a problem.  jkl   Sucralfate     Other reaction(s): Other (See Comments) Abdominal Pain   Amoxicillin     REACTION: rash   Chantix [Varenicline Tartrate]      Heart racing   Erythromycin     REACTION: rash   Sucralfate Nausea And Vomiting    Heart racing Lightheaded  ROS Review of Systems  Constitutional:  Negative for chills, fatigue and fever.  HENT:  Positive for postnasal drip, rhinorrhea, sinus pressure and sore throat (with dry mouth). Negative for congestion and ear pain.   Respiratory:  Positive for shortness of breath. Negative for cough and wheezing.   Cardiovascular:  Negative for chest pain and palpitations.  Neurological:  Negative for dizziness, tremors and weakness.       Objective:    Physical Exam Constitutional:      General: She is not in acute distress.    Appearance: Normal appearance. She is obese. She is not ill-appearing, toxic-appearing or diaphoretic.  HENT:     Right Ear: Hearing, ear canal and external ear normal. A middle ear effusion is present. Tympanic membrane is not erythematous, retracted or bulging.     Left Ear: Hearing, ear canal and external ear normal. A middle ear effusion is present. Tympanic membrane is not erythematous, retracted or bulging.     Nose: Nose normal. No congestion or rhinorrhea.     Mouth/Throat:     Mouth: Mucous membranes are moist.     Pharynx: No pharyngeal swelling or oropharyngeal exudate.     Tonsils: No tonsillar exudate.  Eyes:     Extraocular Movements: Extraocular movements intact.     Conjunctiva/sclera: Conjunctivae normal.     Pupils: Pupils are equal, round, and reactive to light.  Neck:     Thyroid: No thyroid mass.  Cardiovascular:     Rate and Rhythm: Normal rate and regular rhythm.  Pulmonary:     Effort: Pulmonary effort is normal.     Breath sounds: Decreased air movement present. Examination of the right-middle field reveals decreased breath sounds. Examination of the right-lower field reveals decreased breath sounds. Decreased breath sounds present. No wheezing, rhonchi or rales.  Lymphadenopathy:     Cervical:     Right cervical: No  superficial cervical adenopathy.    Left cervical: No superficial cervical adenopathy.  Neurological:     Mental Status: She is alert.      BP 130/82    Pulse 74    Ht 5\' 5"  (1.651 m)    Wt 180 lb (81.6 kg)    SpO2 96%    BMI 29.95 kg/m  Wt Readings from Last 3 Encounters:  12/18/21 180 lb (81.6 kg)  11/17/21 184 lb (83.5 kg)  05/20/21 179 lb 6.4 oz (81.4 kg)     Health Maintenance Due  Topic Date Due   COVID-19 Vaccine (1) Never done   HIV Screening  Never done   Hepatitis C Screening  Never done   Zoster Vaccines- Shingrix (1 of 2) Never done   INFLUENZA VACCINE  05/26/2021   TETANUS/TDAP  08/25/2021    There are no preventive care reminders to display for this patient.  Lab Results  Component Value Date   TSH 3.466 08/23/2018   Lab Results  Component Value Date   WBC 4.5 11/17/2021   HGB 13.9 11/17/2021   HCT 43.3 11/17/2021   MCV 95.2 11/17/2021   PLT 198.0 11/17/2021   Lab Results  Component Value Date   NA 141 11/17/2021   K 4.6 11/17/2021   CO2 30 11/17/2021   GLUCOSE 83 11/17/2021   BUN 19 11/17/2021   CREATININE 1.00 11/17/2021   BILITOT 1.0 11/17/2021   ALKPHOS 106 11/17/2021   AST 25 11/17/2021   ALT 24 11/17/2021   PROT 6.7 11/17/2021   ALBUMIN 4.2 11/17/2021   CALCIUM  9.6 11/17/2021   ANIONGAP 5 06/28/2020   GFR 59.99 (L) 11/17/2021   Lab Results  Component Value Date   CHOL 138 11/17/2021   Lab Results  Component Value Date   HDL 52.50 11/17/2021   Lab Results  Component Value Date   LDLCALC 66 11/17/2021   Lab Results  Component Value Date   TRIG 97.0 11/17/2021   Lab Results  Component Value Date   CHOLHDL 3 11/17/2021   No results found for: HGBA1C    Assessment & Plan:   Problem List Items Addressed This Visit       Cardiovascular and Mediastinum   Essential hypertension, benign - Primary    Increase losartan to 100 mg once daily from 50 mg. Pt advised of the following:  Monitor blood pressure periodically  and/or when you feel symptomatic. Goal is <130/90 on average. Ensure that you have rested for 30 minutes prior to checking your blood pressure. Record your readings and bring them to your next visit if necessary.work on a low sodium diet.       Relevant Medications   losartan (COZAAR) 100 MG tablet     Respiratory   Allergic rhinitis    Continue with Flonase      Asthma exacerbation in COPD (HCC)    Addition of Advair to treatment plan patient to start taking 1 puff twice daily.  See if this helps with the exacerbation.  Also ordering chest x-ray to rule out other etiologies however with long duration of Levaquin therapy unlikely pneumonia.  Continue with Flonase as well.  Patient finishing up prednisone.  Advised to monitor for any tendinitis concerns as she was placed on Levaquin and prednisone which increases this risk (was put on prednisone by urgent care)      Relevant Medications   predniSONE (DELTASONE) 20 MG tablet   albuterol (VENTOLIN HFA) 108 (90 Base) MCG/ACT inhaler   fluticasone-salmeterol (ADVAIR DISKUS) 250-50 MCG/ACT AEPB   Other Relevant Orders   DG Chest 2 View     Digestive   Thrush, oral    Nystatin prescribed patient to take as indicated advised to wash her mouth after every use of an inhaler as well      Relevant Medications   nystatin (MYCOSTATIN) 100000 UNIT/ML suspension     Other   History of COVID-19   Relevant Orders   DG Chest 2 View    Meds ordered this encounter  Medications   losartan (COZAAR) 100 MG tablet    Sig: Take 1 tablet (100 mg total) by mouth daily.    Dispense:  90 tablet    Refill:  1    Order Specific Question:   Supervising Provider    Answer:   BEDSOLE, AMY E [[id number]]   nystatin (MYCOSTATIN) 100000 UNIT/ML suspension    Sig: Take 5 mLs (500,000 Units total) by mouth 4 (four) times daily for 7 days. Swish and spit, retain in mouth for about 30 seconds    Dispense:  60 mL    Refill:  0    Order Specific Question:    Supervising Provider    Answer:   BEDSOLE, AMY E [[id number]]   fluticasone-salmeterol (ADVAIR DISKUS) 250-50 MCG/ACT AEPB    Sig: Inhale 1 puff into the lungs in the morning and at bedtime.    Dispense:  1 each    Refill:  1    Order Specific Question:   Supervising Provider    Answer:   BEDSOLE, AMY E [[id number]]  Follow-up: Return in about 3 weeks (around 01/08/2022) for with blood pressure log .    Eugenia Pancoast, FNP

## 2021-12-22 DIAGNOSIS — J301 Allergic rhinitis due to pollen: Secondary | ICD-10-CM | POA: Diagnosis not present

## 2021-12-24 DIAGNOSIS — J0140 Acute pansinusitis, unspecified: Secondary | ICD-10-CM | POA: Diagnosis not present

## 2021-12-24 DIAGNOSIS — G4733 Obstructive sleep apnea (adult) (pediatric): Secondary | ICD-10-CM | POA: Diagnosis not present

## 2021-12-29 DIAGNOSIS — J301 Allergic rhinitis due to pollen: Secondary | ICD-10-CM | POA: Diagnosis not present

## 2021-12-31 ENCOUNTER — Other Ambulatory Visit: Payer: Self-pay | Admitting: *Deleted

## 2021-12-31 ENCOUNTER — Other Ambulatory Visit: Payer: Self-pay

## 2021-12-31 ENCOUNTER — Emergency Department: Payer: Medicare PPO

## 2021-12-31 ENCOUNTER — Emergency Department
Admission: EM | Admit: 2021-12-31 | Discharge: 2022-01-01 | Disposition: A | Discharge status: Home / Self Care | Payer: Medicare PPO | Attending: Emergency Medicine | Admitting: Emergency Medicine

## 2021-12-31 ENCOUNTER — Encounter: Payer: Self-pay | Admitting: Emergency Medicine

## 2021-12-31 DIAGNOSIS — R079 Chest pain, unspecified: Secondary | ICD-10-CM | POA: Diagnosis not present

## 2021-12-31 DIAGNOSIS — R911 Solitary pulmonary nodule: Secondary | ICD-10-CM

## 2021-12-31 DIAGNOSIS — R918 Other nonspecific abnormal finding of lung field: Secondary | ICD-10-CM | POA: Diagnosis not present

## 2021-12-31 DIAGNOSIS — R791 Abnormal coagulation profile: Secondary | ICD-10-CM | POA: Diagnosis not present

## 2021-12-31 DIAGNOSIS — J9811 Atelectasis: Secondary | ICD-10-CM | POA: Diagnosis not present

## 2021-12-31 DIAGNOSIS — Z1231 Encounter for screening mammogram for malignant neoplasm of breast: Secondary | ICD-10-CM | POA: Insufficient documentation

## 2021-12-31 DIAGNOSIS — J439 Emphysema, unspecified: Secondary | ICD-10-CM | POA: Diagnosis not present

## 2021-12-31 DIAGNOSIS — F17210 Nicotine dependence, cigarettes, uncomplicated: Secondary | ICD-10-CM

## 2021-12-31 DIAGNOSIS — Z87891 Personal history of nicotine dependence: Secondary | ICD-10-CM | POA: Diagnosis not present

## 2021-12-31 DIAGNOSIS — Z20822 Contact with and (suspected) exposure to covid-19: Secondary | ICD-10-CM | POA: Insufficient documentation

## 2021-12-31 DIAGNOSIS — R0602 Shortness of breath: Secondary | ICD-10-CM | POA: Insufficient documentation

## 2021-12-31 DIAGNOSIS — I10 Essential (primary) hypertension: Secondary | ICD-10-CM | POA: Insufficient documentation

## 2021-12-31 LAB — CBC WITH DIFFERENTIAL/PLATELET
Abs Immature Granulocytes: 0.01 10*3/uL (ref 0.00–0.07)
Basophils Absolute: 0 10*3/uL (ref 0.0–0.1)
Basophils Relative: 0 %
Eosinophils Absolute: 0.3 10*3/uL (ref 0.0–0.5)
Eosinophils Relative: 5 %
HCT: 42.6 % (ref 36.0–46.0)
Hemoglobin: 13 g/dL (ref 12.0–15.0)
Immature Granulocytes: 0 %
Lymphocytes Relative: 23 %
Lymphs Abs: 1.3 10*3/uL (ref 0.7–4.0)
MCH: 30.4 pg (ref 26.0–34.0)
MCHC: 30.5 g/dL (ref 30.0–36.0)
MCV: 99.8 fL (ref 80.0–100.0)
Monocytes Absolute: 0.4 10*3/uL (ref 0.1–1.0)
Monocytes Relative: 7 %
Neutro Abs: 3.7 10*3/uL (ref 1.7–7.7)
Neutrophils Relative %: 65 %
Platelets: 221 10*3/uL (ref 150–400)
RBC: 4.27 MIL/uL (ref 3.87–5.11)
RDW: 14 % (ref 11.5–15.5)
WBC: 5.7 10*3/uL (ref 4.0–10.5)
nRBC: 0 % (ref 0.0–0.2)

## 2021-12-31 LAB — COMPREHENSIVE METABOLIC PANEL
ALT: 14 U/L (ref 0–44)
AST: 22 U/L (ref 15–41)
Albumin: 3.6 g/dL (ref 3.5–5.0)
Alkaline Phosphatase: 100 U/L (ref 38–126)
Anion gap: 7 (ref 5–15)
BUN: 13 mg/dL (ref 8–23)
CO2: 27 mmol/L (ref 22–32)
Calcium: 9.1 mg/dL (ref 8.9–10.3)
Chloride: 108 mmol/L (ref 98–111)
Creatinine, Ser: 0.96 mg/dL (ref 0.44–1.00)
GFR, Estimated: >60 mL/min (ref >60)
Glucose, Bld: 110 mg/dL — ABNORMAL HIGH (ref 70–99)
Potassium: 3.9 mmol/L (ref 3.5–5.1)
Sodium: 142 mmol/L (ref 135–145)
Total Bilirubin: 1 mg/dL (ref 0.3–1.2)
Total Protein: 7 g/dL (ref 6.5–8.1)

## 2021-12-31 LAB — RESP PANEL BY RT-PCR (FLU A&B, COVID) ARPGX2
Influenza A by PCR: NEGATIVE
Influenza B by PCR: NEGATIVE
SARS Coronavirus 2 by RT PCR: NEGATIVE

## 2021-12-31 LAB — BRAIN NATRIURETIC PEPTIDE: B Natriuretic Peptide: 250.8 pg/mL — ABNORMAL HIGH (ref 0.0–100.0)

## 2021-12-31 LAB — TROPONIN I (HIGH SENSITIVITY): Troponin I (High Sensitivity): 12 ng/L (ref <18)

## 2021-12-31 LAB — D-DIMER, QUANTITATIVE: D-Dimer, Quant: 0.63 ug/mL-FEU — ABNORMAL HIGH (ref 0.00–0.50)

## 2021-12-31 MED ORDER — IOHEXOL 350 MG/ML SOLN
75.0000 mL | Freq: Once | INTRAVENOUS | Status: AC | PRN
  Administered 2021-12-31: 75 mL via INTRAVENOUS

## 2021-12-31 NOTE — ED Provider Notes (Signed)
? ?[facility] ?Provider Note ? ? ? Event Date/Time  ? First MD Initiated Contact with Patient 12/31/21 2233   ?  (approximate) ? ? ?History  ? ?Shortness of Breath ? ? ?HPI ? ?Heather Beasley is a 66 y.o. female with hypertension, smoking history but no known diagnosis of COPD who comes in with shortness of breath.  Patient reports having COVID back in February.  She reports that time being started on Levaquin, Tessalon Perles.  She has been followed by ENT for episodes at nighttime where she wakes up feeling like she cannot breathe.  She just sent in a sleep study that should be there on Monday.  She denies any wheezing but does report having an inhaler that she will use.  She reports a little bit of chest pain that radiates into her back as well.  Denies any leg swelling does report some shortness of breath with ambulation ? ?Physical Exam  ? ?Triage Vital Signs: ?ED Triage Vitals  ?Enc Vitals Group  ?   BP 12/31/21 2202 (!) 159/81  ?   Pulse Rate 12/31/21 2202 86  ?   Resp 12/31/21 2202 18  ?   Temp 12/31/21 2202 98.7 ?F (37.1 ?C)  ?   Temp Source 12/31/21 2202 Oral  ?   SpO2 12/31/21 2202 97 %  ?   Weight 12/31/21 2203 180 lb (81.6 kg)  ?   Height 12/31/21 2203 '5\' 5"'$  (1.651 m)  ?   Head Circumference --   ?   Peak Flow --   ?   Pain Score 12/31/21 2204 6  ?   Pain Loc --   ?   Pain Edu? --   ?   Excl. in [place]? --   ? ? ?Most recent vital signs: ?Vitals:  ? 12/31/21 2202  ?BP: (!) 159/81  ?Pulse: 86  ?Resp: 18  ?Temp: 98.7 ?F (37.1 ?C)  ?SpO2: 97%  ? ? ? ?General: Awake, no distress.  ?CV:  Good peripheral perfusion.  ?Resp:  Normal effort.  ?Abd:  No distention.  ?Other:  No calf tenderness.  No swelling of the leg ? ? ?ED Results / Procedures / Treatments  ? ?Labs ?(all labs ordered are listed, but only abnormal results are displayed) ?Labs Reviewed  ?COMPREHENSIVE METABOLIC PANEL - Abnormal; Notable for the following components:  ?    Result Value  ? Glucose, Bld 110 (*)   ? All other  components within normal limits  ?D-DIMER, QUANTITATIVE - Abnormal; Notable for the following components:  ? D-Dimer, Quant 0.63 (*)   ? All other components within normal limits  ?RESP PANEL BY RT-PCR (FLU A&B, COVID) [id number]  ?CBC WITH DIFFERENTIAL/PLATELET  ?BRAIN NATRIURETIC PEPTIDE  ?TROPONIN I (HIGH SENSITIVITY)  ? ? ? ?EKG ? ?My interpretation of EKG: ? ?Normal sinus rate of 78 without any ST elevation or T wave inversions, normal intervals ? ?RADIOLOGY ?I have reviewed the xray and there is an ill-defined right infrahilar opacity ? ?PROCEDURES: ? ?Critical Care performed: No ? ?.1-3 Lead EKG Interpretation ?Performed by: Vanessa Bowman, MD ?Authorized by: Vanessa Grace, MD  ? ?  Interpretation: normal   ?  ECG rate:  80 ?  ECG rate assessment: normal   ?  Rhythm: sinus rhythm   ?  Ectopy: none   ?  Conduction: normal   ? ? ?MEDICATIONS ORDERED IN ED: ?Medications - No data to display ? ? ?IMPRESSION / MDM / ASSESSMENT  AND PLAN / ED COURSE  ?I reviewed the triage vital signs and the nursing notes. ? ?With known smoking history comes in with concerns for worsening shortness of breath as well as feeling like she is having difficulties with breathing at nighttime.  She is already being worked up by ENT with a sleep study.  Definitely sounds like this could be sleep apnea and I explained to patient that is not really a lot I can do to fix this.  However she does report some shortness of breath with exertion so we will do work-up to evaluate for this.  On examination she has no wheezing to suggest COPD exacerbation. ? ?CBC without evidence of any anemia.  CT abdomen PE with normal kidney function.  D-dimer is elevated so we will get CT PE ? ?Patient be handed off to oncoming team pending CT imaging and reassessment. ? ?The patient is on the cardiac monitor to evaluate for evidence of arrhythmia and/or significant heart rate changes. ? ? ?FINAL CLINICAL IMPRESSION(S) / ED DIAGNOSES  ? ?Final diagnoses:  ?Shortness of  breath  ? ? ? ?Rx / DC Orders  ? ?ED Discharge Orders   ? ? None  ? ?  ? ? ? ?Note:  This document was prepared using Dragon voice recognition software and may include unintentional dictation errors. ?  ?Vanessa P[place]osa, MD ?12/31/21 2303 ? ?

## 2021-12-31 NOTE — ED Triage Notes (Addendum)
Pt presents via POV with complaints of SOB starting about a week ago that's worse when laying flat. She endorses midsternal CP that radiates towards her back that started tonight. Of note, she was told in the past she had COPD but has never been formally diagnosed - she is also being evaluated for OSA. Denies lightheadedness.  ?

## 2022-01-01 ENCOUNTER — Ambulatory Visit
Admission: RE | Admit: 2022-01-01 | Discharge: 2022-01-01 | Disposition: A | Discharge status: Home / Self Care | Payer: Medicare PPO | Source: Ambulatory Visit | Attending: Family | Admitting: Family

## 2022-01-01 DIAGNOSIS — Z803 Family history of malignant neoplasm of breast: Secondary | ICD-10-CM

## 2022-01-01 DIAGNOSIS — Z1231 Encounter for screening mammogram for malignant neoplasm of breast: Secondary | ICD-10-CM | POA: Diagnosis not present

## 2022-01-01 NOTE — ED Provider Notes (Signed)
----------------------------------------- ?  12:43 AM on 01/01/2022 ?-----------------------------------------  ? ?CTA Chest: ? ?1. No pulmonary embolus.  ?2. Mild bibasilar atelectasis without pneumonia.  ?3. Multiple tiny punctate calcified and noncalcified pulmonary  ?nodules, majority of which are stable from prior exam. Left lower  ?lobe subsolid nodule measuring 6 mm is slightly larger. Attention to  ?this at lung cancer screening follow-up CT is recommended.  ?4. Aortic atherosclerosis.  Coronary artery calcifications.  ? ?Updated patient on CT imaging result.  Will refer to pulmonary nodule clinic.  Strict return precautions given.  Patient verbalizes understanding and agrees with plan of care. ?  ?Paulette Blanch, MD ?01/01/22 0141 ? ?

## 2022-01-01 NOTE — Discharge Instructions (Addendum)
Use your inhaler 2 puffs every 4 hours as needed for difficulty breathing.  Return to the ER for worsening symptoms, persistent vomiting, difficulty breathing or other concerns. ?

## 2022-01-05 DIAGNOSIS — J301 Allergic rhinitis due to pollen: Secondary | ICD-10-CM | POA: Diagnosis not present

## 2022-01-08 ENCOUNTER — Telehealth: Payer: Self-pay | Admitting: *Deleted

## 2022-01-08 ENCOUNTER — Other Ambulatory Visit: Payer: Self-pay

## 2022-01-08 ENCOUNTER — Encounter: Payer: Self-pay | Admitting: Family

## 2022-01-08 ENCOUNTER — Ambulatory Visit: Payer: Medicare PPO | Admitting: Family

## 2022-01-08 VITALS — BP 146/90 | HR 88 | Ht 65.0 in | Wt 178.0 lb

## 2022-01-08 DIAGNOSIS — R7989 Other specified abnormal findings of blood chemistry: Secondary | ICD-10-CM

## 2022-01-08 DIAGNOSIS — R0609 Other forms of dyspnea: Secondary | ICD-10-CM | POA: Diagnosis not present

## 2022-01-08 DIAGNOSIS — E559 Vitamin D deficiency, unspecified: Secondary | ICD-10-CM

## 2022-01-08 DIAGNOSIS — R5383 Other fatigue: Secondary | ICD-10-CM

## 2022-01-08 DIAGNOSIS — J439 Emphysema, unspecified: Secondary | ICD-10-CM

## 2022-01-08 DIAGNOSIS — R911 Solitary pulmonary nodule: Secondary | ICD-10-CM

## 2022-01-08 DIAGNOSIS — R0602 Shortness of breath: Secondary | ICD-10-CM

## 2022-01-08 LAB — TSH: TSH: 4.02 u[IU]/mL (ref 0.35–5.50)

## 2022-01-08 LAB — T3, FREE: T3, Free: 3.3 pg/mL (ref 2.3–4.2)

## 2022-01-08 LAB — T4, FREE: Free T4: 0.77 ng/dL (ref 0.60–1.60)

## 2022-01-08 LAB — VITAMIN B12: Vitamin B-12: 231 pg/mL (ref 211–911)

## 2022-01-08 MED ORDER — MONTELUKAST SODIUM 10 MG PO TABS: 10.0000 mg | ORAL_TABLET | Freq: Every day | ORAL | 3 refills | Status: DC

## 2022-01-08 MED ORDER — VITAMIN D (ERGOCALCIFEROL) 1.25 MG (50000 UNIT) PO CAPS: 50000.0000 [IU] | ORAL_CAPSULE | ORAL | 0 refills | Status: AC

## 2022-01-08 NOTE — Assessment & Plan Note (Signed)
Reviewed xray  ?Echocardiogram ordered to r/o heart failure with elevated BNP ?Referral to cardiologist as well  ? ?

## 2022-01-08 NOTE — Telephone Encounter (Signed)
Pt had an appt with tabitha and requesting to fill-out the placard form. The form placed on [place] office to complete. ?

## 2022-01-08 NOTE — Progress Notes (Signed)
Thyroid still stable ?B12 still on lower side set up pt for b12 injections Qm x 28mthen have her repeat b12 in 313mRecommend otc vitamin b12 1000 mcg once daily as well

## 2022-01-08 NOTE — Assessment & Plan Note (Signed)
cxr reviewed, emphysema no PE  ?Ordering echocardiogram  ?Referral to cardiologist for racing heart rate and elevated BNP ?

## 2022-01-08 NOTE — Patient Instructions (Addendum)
A referral was placed today for cardiology. ?Please let us know if you have not heard back within 1 week about your referral. ? ?I have ordered imaging for you at [facility] for an echocardiogram. This order has been sent over for you electronically.  ?Please call [phone] to schedule this appointment. ? ?I have sent in a RX to you to start, Singulair which may help as well. Advair needs to be used DAILY.  ? ?It was a pleasure seeing you today! Please do not hesitate to reach out with any questions and or concerns. ? ?Regards,  ? ?Alese Furniss ?FNP-C ? ? ?

## 2022-01-08 NOTE — Progress Notes (Signed)
? ?Established Patient Office Visit ? ?Subjective:  ?Patient ID: Heather Beasley, female    DOB: [birth year]  Age: 66 y.o. MRN: [id number] ? ?CC:  ?Chief Complaint  ?Patient presents with  ? Hypertension  ?  F/u  ? ? ?HPI ?Heather Beasley is here today for follow up.  ? ?HTN: has been tracking blood pressure but not resting prior to taking log. Average fluctuates lowest 98/71 and highest 172/96. Average heart rate ranges from 84-120 at highest x 1 time. Denies chest pain or palpitations. Does feel like racing all of the time though , does have sob.  ? ?ER: went to ER 12/31/21:  ?CTA Chest: ?  ?1. No pulmonary embolus.  ?2. Mild bibasilar atelectasis without pneumonia.  ?3. Multiple tiny punctate calcified and noncalcified pulmonary  ?nodules, majority of which are stable from prior exam. Left lower  ?lobe subsolid nodule measuring 6 mm is slightly larger. Attention to  ?this at lung cancer screening follow-up CT is recommended.  ?4. Aortic atherosclerosis.  Coronary artery calcifications.  ?Was referred to pulmonary nodule clinic. Has appt April 4th, with Dr. Patsey Berthold, this is a pulmonologist. ?Followed by Ent for night time episodes where she can not breath, just had a sleep study, that stated she had to repeat the test.   ? ?Bnp was elevated as well at the ER, she does have DOE and SOB especially at night time. She doesn't think she has swelling in her legs. Ekg in ER was NSR with no T wave abn. No recent echocardiogram. ? ?Past Medical History:  ?Diagnosis Date  ? Allergic rhinitis   ? Arthritis   ? Asthma   ? Cancer of nasal cavity and sinus ([place]) 07/07/2015  ? radiation  ? Cervical spondylosis   ? Colitis   ? Colon polyps   ? Depression   ? Diverticulosis   ? GERD (gastroesophageal reflux disease)   ? Glaucoma   ? Headaches, cluster   ? Hyperlipidemia   ? IBS (irritable bowel syndrome)   ? Kidney stones   ? Melanoma ([place])   ? above the L knee   ? Neuropathy   ? PONV (postoperative nausea and vomiting)   ? Tobacco  abuse   ? ? ?Past Surgical History:  ?Procedure Laterality Date  ? ABDOMINAL HYSTERECTOMY  1998  ? APPENDECTOMY  1998  ? [place] REPAIR  2001  ? BACK SURGERY    ? CAROTID PTA/STENT INTERVENTION Left 06/27/2020  ? Procedure: CAROTID PTA/STENT INTERVENTION;  Surgeon: Algernon Huxley, MD;  Location: [facility];  Service: Cardiovascular;  Laterality: Left;  ? CERVICAL FUSION  2010  ? CHOLECYSTECTOMY  08/2011  ? Dr Rochel Brome  ? COLONOSCOPY    ? Dr Tiffany Kocher  ? CYSTOSCOPY    ? Dr. Bernardo Heater  ? DILATION AND CURETTAGE OF UTERUS    ? ESOPHAGOGASTRODUODENOSCOPY  2012  ? Dr Tiffany Kocher  ? FRACTURE SURGERY  2001  ? plate in right wrist and arm  ? IMAGE GUIDED SINUS SURGERY N/A 06/25/2015  ? Procedure: IMAGE GUIDED SINUS SURGERY;  Surgeon: Beverly Gust, MD;  Location: [facility];  Service: ENT;  Laterality: N/A;  ? LUMBAR SPINE SURGERY  07/2004  ? OOPHORECTOMY    ? POLYPECTOMY N/A 06/25/2015  ? Procedure: POLYPECTOMY NASAL;  Surgeon: Beverly Gust, MD;  Location: [facility];  Service: ENT;  Laterality: N/A;  ? TONSILLECTOMY  1968  ? VAGINAL DELIVERY    ? x2  ? ? ?Family History  ?  Problem Relation Age of Onset  ? Hypertension Mother   ? Hyperlipidemia Mother   ? Diabetes Mother   ? Colon cancer Father   ? Pancreatic cancer Father   ? Cancer Father   ?     Colon & Pancreatic  ? Breast cancer Paternal Grandmother 56  ? Diabetes Paternal Grandmother   ? Coronary artery disease Paternal Grandmother   ? Heart failure Paternal Grandmother   ? Cancer Paternal Grandmother   ?     breast  ? Stroke Brother   ? ? ?Social History  ? ?Socioeconomic History  ? Marital status: Married  ?  Spouse name: Not on file  ? Number of children: 2  ? Years of education: Not on file  ? Highest education level: Not on file  ?Occupational History  ? Occupation: Scientist, water quality  ?Tobacco Use  ? Smoking status: Every Day  ?  Packs/day: 0.25  ?  Years: 39.00  ?  Pack years: 9.75  ?  Types: Cigarettes  ? Smokeless tobacco: Never  ? Tobacco comments:  ?  e cigs  currently--trying to quit  ?Substance and Sexual Activity  ? Alcohol use: No  ? Drug use: No  ? Sexual activity: Not Currently  ?  Partners: Male  ?Other Topics Concern  ? Not on file  ?Social History Narrative  ? Lives in [place] with husband. Dog in home. Work - disabled for neck and back pain.  ? ?Social Determinants of Health  ? ?Financial Resource Strain: Not on file  ?Food Insecurity: Not on file  ?Transportation Needs: Not on file  ?Physical Activity: Not on file  ?Stress: Not on file  ?Social Connections: Not on file  ?Intimate Partner Violence: Not on file  ? ? ?Outpatient Medications Prior to Visit  ?Medication Sig Dispense Refill  ? albuterol (VENTOLIN HFA) 108 (90 Base) MCG/ACT inhaler Inhale into the lungs every 6 (six) hours as needed.    ? atorvastatin (LIPITOR) 20 MG tablet Take 1 tablet (20 mg total) by mouth daily. 90 tablet 1  ? cetirizine (ZYRTEC) 10 MG tablet Take 10 mg by mouth daily. Allergies.    ? clopidogrel (PLAVIX) 75 MG tablet Take 1 tablet (75 mg total) by mouth daily. 90 tablet 3  ? cyclobenzaprine (FLEXERIL) 10 MG tablet Take 1 tablet (10 mg total) by mouth 3 (three) times daily as needed for muscle spasms. 30 tablet 0  ? EPINEPHrine 0.3 mg/0.3 mL IJ SOAJ injection Inject 1 mg into the skin as needed.    ? famotidine (PEPCID) 40 MG tablet Take 1 tablet by mouth at bedtime.    ? fluticasone-salmeterol (ADVAIR DISKUS) 250-50 MCG/ACT AEPB Inhale 1 puff into the lungs in the morning and at bedtime. 1 each 1  ? loperamide (IMODIUM A-D) 2 MG tablet Take 2 mg by mouth 2 (two) times a day.    ? losartan (COZAAR) 100 MG tablet Take 1 tablet (100 mg total) by mouth daily. 90 tablet 1  ? Multiple Vitamins-Minerals (CENTRUM ADULTS PO) Take 1 tablet by mouth.    ? pantoprazole (PROTONIX) 40 MG tablet Take 1 tablet (40 mg total) by mouth 2 (two) times daily. 60 tablet 11  ? predniSONE (DELTASONE) 20 MG tablet Take 20 mg by mouth. Take two tablets once daily for five days    ? VENTOLIN HFA 108  (90 Base) MCG/ACT inhaler INHALE 2 PUFFS INTO THE LUNGS EVERY 6 HOURS AS NEEDED FOR WHEEZING ORSHORTNESS OF BREATH 18 g 0  ? ?  No facility-administered medications prior to visit.  ? ? ?Allergies  ?Allergen Reactions  ? Albumin (Human) Rash  ? Amoxicillin-Pot Clavulanate Hives  ? Gabapentin Shortness Of Breath  ? Latex Dermatitis and Rash  ? Betadine [Povidone Iodine] Dermatitis, Rash and Other (See Comments)  ?  Skin burning ?Topical betadine and iodine have this reaction with patient.  IV contrast is NOT a problem.  jkl  ? Sucralfate   ?  Other reaction(s): Other (See Comments) ?Abdominal Pain  ? Amoxicillin   ?  REACTION: rash  ? Chantix [Varenicline Tartrate]   ?  Heart racing  ? Erythromycin   ?  REACTION: rash  ? Sucralfate Nausea And Vomiting  ?  Heart racing ?Lightheaded ?  ? ? ?ROS ?Review of Systems  ?Constitutional:  Negative for chills and fatigue.  ?Respiratory:  Positive for chest tightness (at times during the night) and shortness of breath. Negative for cough.   ?     Waking up often at night gasping for breath sob  ?Cardiovascular:  Negative for chest pain, palpitations (feels heart racing at times) and leg swelling.  ?Gastrointestinal:  Negative for diarrhea and nausea.  ?Genitourinary:  Negative for difficulty urinating.  ?Psychiatric/Behavioral:  Negative for agitation and sleep disturbance.   ?All other systems reviewed and are negative. ? ? ? ?  ?Objective:  ?  ?Physical Exam ?Vitals reviewed.  ?Constitutional:   ?   General: She is not in acute distress. ?   Appearance: Normal appearance. She is not ill-appearing or toxic-appearing.  ?HENT:  ?   Right Ear: Tympanic membrane normal.  ?   Left Ear: Tympanic membrane normal.  ?   Mouth/Throat:  ?   Mouth: Mucous membranes are moist.  ?   Pharynx: No pharyngeal swelling.  ?   Tonsils: No tonsillar exudate.  ?Eyes:  ?   Extraocular Movements: Extraocular movements intact.  ?   Conjunctiva/sclera: Conjunctivae normal.  ?   Pupils: Pupils are equal,  round, and reactive to light.  ?Neck:  ?   Thyroid: No thyroid mass.  ?Cardiovascular:  ?   Rate and Rhythm: Normal rate and regular rhythm.  ?Pulmonary:  ?   Effort: Pulmonary effort is normal.  ?   Breath sounds: N

## 2022-01-08 NOTE — Assessment & Plan Note (Signed)
Pt educated on taking advair daily not prn  ?Also adding RX for singulair 10 mg one po qd  ?Pt to f/u one month on this medication ?Pt to f/u with pulmonary screening clinic for pulmonary nodules ?

## 2022-01-08 NOTE — Assessment & Plan Note (Signed)
Pt to f/u with pulmonary nodule screening clinic ?

## 2022-01-08 NOTE — Assessment & Plan Note (Signed)
Vitamin d ordered pending results ? ?

## 2022-01-09 NOTE — Telephone Encounter (Signed)
Notified pt--placard form is complete and ready to be pick up at the front office. ?

## 2022-01-12 ENCOUNTER — Encounter: Payer: Self-pay | Admitting: Cardiovascular Disease

## 2022-01-12 ENCOUNTER — Other Ambulatory Visit: Payer: Self-pay

## 2022-01-12 ENCOUNTER — Ambulatory Visit: Payer: Medicare PPO | Admitting: Cardiovascular Disease

## 2022-01-12 VITALS — BP 122/80 | HR 83 | Ht 65.0 in | Wt 177.4 lb

## 2022-01-12 DIAGNOSIS — I2510 Atherosclerotic heart disease of native coronary artery without angina pectoris: Secondary | ICD-10-CM | POA: Diagnosis not present

## 2022-01-12 DIAGNOSIS — R0609 Other forms of dyspnea: Secondary | ICD-10-CM

## 2022-01-12 MED ORDER — METOPROLOL TARTRATE 100 MG PO TABS: 100.0000 mg | ORAL_TABLET | Freq: Once | ORAL | 0 refills | Status: DC

## 2022-01-12 NOTE — Patient Instructions (Signed)
Medication Instructions:  ?No changes ?*If you need a refill on your cardiac medications before your next appointment, please call your pharmacy* ? ? ?Lab Work: ?none ?If you have labs (blood work) drawn today and your tests are completely normal, you will receive your results only by: ?MyChart Message (if you have MyChart) OR ?A paper copy in the mail ?If you have any lab test that is abnormal or we need to change your treatment, we will call you to review the results. ? ? ?Testing/Procedures: ? ?Cardiac CTA - see instructions below ? ? ?Follow-Up: ?At [facility], you and your health needs are our priority.  As part of our continuing mission to provide you with exceptional heart care, we have created designated Provider Care Teams.  These Care Teams include your primary Cardiologist (physician) and Advanced Practice Providers (APPs -  Physician Assistants and Nurse Practitioners) who all work together to provide you with the care you need, when you need it. ? ? ?Your next appointment:   ?4 -8 week(s) ? ?The format for your next appointment:   ?In Person ? ?Provider:   ?Lauree Chandler, MD   ? ? ?Other Instructions ? ? ?Your cardiac CT will be scheduled at  ?[facility] ?[address] ?[place], [place] [address] ?[phone] ? ? ?Please arrive at the [facility] and Children's Entrance (Entrance C2) of [facility] 30 minutes prior to test start time. ?You can use the FREE valet parking offered at entrance C (encouraged to control the heart rate for the test)  ?Proceed to the [facility] Radiology Department (first floor) to check-in and test prep. ? ?All radiology patients and guests should use entrance C2 at [facility], accessed from [facility], even though the hospital's physical address listed is [address] ? ? ? ? ?Please follow these instructions carefully (unless otherwise directed): ? ? ?On the Night Before the Test: ?Be sure to Drink plenty of  water. ?Do not consume any caffeinated/decaffeinated beverages or chocolate 12 hours prior to your test. ?Do not take any antihistamines 12 hours prior to your test. ? ?On the Day of the Test: ?Drink plenty of water until 1 hour prior to the test. ?Do not eat any food 4 hours prior to the test. ?You may take your regular medications prior to the test.  ?Take metoprolol (Lopressor) two hours prior to test. ?FEMALES- please wear underwire-free bra if available, avoid dresses & tight clothing ? ?After the Test: ?Drink plenty of water. ?After receiving IV contrast, you may experience a mild flushed feeling. This is normal. ?On occasion, you may experience a mild rash up to 24 hours after the test. This is not dangerous. If this occurs, you can take Benadryl 25 mg and increase your fluid intake. ?If you experience trouble breathing, this can be serious. If it is severe call 911 IMMEDIATELY. If it is mild, please call our office. ?If you take any of these medications: Glipizide/Metformin, Avandament, Glucavance, please do not take 48 hours after completing test unless otherwise instructed. ? ?We will call to schedule your test 2-4 weeks out understanding that some insurance companies will need an authorization prior to the service being performed.  ? ?For non-scheduling related questions, please contact the cardiac imaging nurse navigator should you have any questions/concerns: ?Marchia Bond, Cardiac Imaging Nurse Navigator ?Gordy Clement, Cardiac Imaging Nurse Navigator ?[facility] ?Direct Office Dial: [phone]  ? ?For scheduling needs, including cancellations and  rescheduling, please call [place], [phone]. ?  ?

## 2022-01-12 NOTE — Progress Notes (Signed)
Chief Complaint  Patient presents with   New Patient (Initial Visit)    Dyspnea   History of Present Illness: 66 yo female with history of carotid artery disease s/p carotid artery stenting, arthritis, depression, GERD, hyperlipidemia, HTN, IBS and prior tobacco abuse here today as a new patient for the evaluation of shortness of breath and palpitations. She was seen in the ED on 12/31/21 with c/o dyspnea. Chest CTA with no PE but coronary artery calcifications noted.She is known to have carotid artery disease felt to be due to prior radiation therapy and has undergone carotid artery stenting at [facility] in 2021. She continues to smoke.    She tells me that she has dyspnea when she walks a long distance. No chest pain. No LE edema. No dizziness.   Primary Care Physician: Mort Sawyers, FNP  Past Medical History:  Diagnosis Date   Allergic rhinitis    Arthritis    Asthma    Cancer of nasal cavity and sinus ([facility]) 07/07/2015   radiation   Cervical spondylosis    Colitis    Colon polyps    Depression    Diverticulosis    GERD (gastroesophageal reflux disease)    Glaucoma    Headaches, cluster    HTN (hypertension)    Hyperlipidemia    IBS (irritable bowel syndrome)    Kidney stones    Melanoma ([facility])    above the L knee    Neuropathy    PONV (postoperative nausea and vomiting)    Tobacco abuse     Past Surgical History:  Procedure Laterality Date   ABDOMINAL HYSTERECTOMY  1998   APPENDECTOMY  1998   ARM FX REPAIR  2001   BACK SURGERY     CAROTID PTA/STENT INTERVENTION Left 06/27/2020   Procedure: CAROTID PTA/STENT INTERVENTION;  Surgeon: Annice Needy, MD;  Location: [facility];  Service: Cardiovascular;  Laterality: Left;   CERVICAL FUSION  2010   CHOLECYSTECTOMY  08/2011   Dr Renda Rolls   COLONOSCOPY     Dr Markham Jordan   CYSTOSCOPY     Dr. Lonna Cobb   DILATION AND CURETTAGE OF UTERUS     ESOPHAGOGASTRODUODENOSCOPY  2012   Dr Markham Jordan   FRACTURE SURGERY  2001    plate in right wrist and arm   IMAGE GUIDED SINUS SURGERY N/A 06/25/2015   Procedure: IMAGE GUIDED SINUS SURGERY;  Surgeon: Linus Salmons, MD;  Location: [facility];  Service: ENT;  Laterality: N/A;   LUMBAR SPINE SURGERY  07/2004   OOPHORECTOMY     POLYPECTOMY N/A 06/25/2015   Procedure: POLYPECTOMY NASAL;  Surgeon: Linus Salmons, MD;  Location: [facility];  Service: ENT;  Laterality: N/A;   TONSILLECTOMY  1968   VAGINAL DELIVERY     x2    Current Outpatient Medications  Medication Sig Dispense Refill   albuterol (VENTOLIN HFA) 108 (90 Base) MCG/ACT inhaler Inhale into the lungs every 6 (six) hours as needed.     atorvastatin (LIPITOR) 20 MG tablet Take 1 tablet (20 mg total) by mouth daily. 90 tablet 1   cetirizine (ZYRTEC) 10 MG tablet Take 10 mg by mouth daily. Allergies.     clopidogrel (PLAVIX) 75 MG tablet Take 1 tablet (75 mg total) by mouth daily. 90 tablet 3   cyclobenzaprine (FLEXERIL) 10 MG tablet Take 1 tablet (10 mg total) by mouth 3 (three) times daily as needed for muscle spasms. 30 tablet 0   EPINEPHrine 0.3 mg/0.3 mL IJ SOAJ injection  Inject 1 mg into the skin as needed.     famotidine (PEPCID) 40 MG tablet Take 1 tablet by mouth at bedtime.     fluticasone-salmeterol (ADVAIR DISKUS) 250-50 MCG/ACT AEPB Inhale 1 puff into the lungs in the morning and at bedtime. 1 each 1   loperamide (IMODIUM A-D) 2 MG tablet Take 2 mg by mouth 2 (two) times a day.     losartan (COZAAR) 100 MG tablet Take 1 tablet (100 mg total) by mouth daily. 90 tablet 1   metoprolol tartrate (LOPRESSOR) 100 MG tablet Take 1 tablet (100 mg total) by mouth once for 1 dose. Take 90-120 minutes prior to scan. 1 tablet 0   montelukast (SINGULAIR) 10 MG tablet Take 1 tablet (10 mg total) by mouth at bedtime. 30 tablet 3   Multiple Vitamins-Minerals (CENTRUM ADULTS PO) Take 1 tablet by mouth.     pantoprazole (PROTONIX) 40 MG tablet Take 1 tablet (40 mg total) by mouth 2 (two) times daily. 60 tablet 11    VENTOLIN HFA 108 (90 Base) MCG/ACT inhaler INHALE 2 PUFFS INTO THE LUNGS EVERY 6 HOURS AS NEEDED FOR WHEEZING ORSHORTNESS OF BREATH 18 g 0   Vitamin D, Ergocalciferol, (DRISDOL) 1.25 MG (50000 UNIT) CAPS capsule Take 1 capsule (50,000 Units total) by mouth every 7 (seven) days for 8 doses. 8 capsule 0   No current facility-administered medications for this visit.    Allergies  Allergen Reactions   Albumin (Human) Rash   Amoxicillin-Pot Clavulanate Hives   Gabapentin Shortness Of Breath   Latex Dermatitis and Rash   Betadine [Povidone Iodine] Dermatitis, Rash and Other (See Comments)    Skin burning Topical betadine and iodine have this reaction with patient.  IV contrast is NOT a problem.  jkl   Sucralfate     Other reaction(s): Other (See Comments) Abdominal Pain   Amoxicillin     REACTION: rash   Chantix [Varenicline Tartrate]     Heart racing   Erythromycin     REACTION: rash   Sucralfate Nausea And Vomiting    Heart racing Lightheaded     Social History   Socioeconomic History   Marital status: Married    Spouse name: Not on file   Number of children: 2   Years of education: Not on file   Highest education level: Not on file  Occupational History   Occupation: Conservation officer, nature   Occupation: disabled  Tobacco Use   Smoking status: Every Day    Packs/day: 0.25    Years: 39.00    Pack years: 9.75    Types: Cigarettes   Smokeless tobacco: Never   Tobacco comments:    e cigs currently--trying to quit  Substance and Sexual Activity   Alcohol use: No   Drug use: No   Sexual activity: Not Currently    Partners: Male  Other Topics Concern   Not on file  Social History Narrative   Lives in [place] with husband. Dog in home. Work - disabled for neck and back pain.   Social Determinants of Health   Financial Resource Strain: Not on file  Food Insecurity: Not on file  Transportation Needs: Not on file  Physical Activity: Not on file  Stress: Not on file  Social  Connections: Not on file  Intimate Partner Violence: Not on file    Family History  Problem Relation Age of Onset   Hypertension Mother    Hyperlipidemia Mother    Diabetes Mother    Colon cancer  Father    Pancreatic cancer Father    Cancer Father        Colon & Pancreatic   Breast cancer Paternal Grandmother 45   Diabetes Paternal Grandmother    Coronary artery disease Paternal Grandmother    Heart failure Paternal Grandmother    Cancer Paternal Grandmother        breast   Stroke Brother     Review of Systems:  As stated in the HPI and otherwise negative.   BP 122/80   Pulse 83   Ht 5\' 5"  (1.651 m)   Wt 177 lb 6.4 oz (80.5 kg)   SpO2 98%   BMI 29.52 kg/m   Physical Examination: General: Well developed, well nourished, NAD  HEENT: OP clear, mucus membranes moist  SKIN: warm, dry. No rashes. Neuro: No focal deficits  Musculoskeletal: Muscle strength 5/5 all ext  Psychiatric: Mood and affect normal  Neck: No JVD, no carotid bruits, no thyromegaly, no lymphadenopathy.  Lungs:Clear bilaterally, no wheezes, rhonci, crackles Cardiovascular: Regular rate and rhythm. No murmurs, gallops or rubs. Abdomen:Soft. Bowel sounds present. Non-tender.  Extremities: No lower extremity edema. Pulses are 2 + in the bilateral DP/PT.  EKG:  EKG is not ordered today. The ekg ordered today demonstrates   Recent Labs: 12/31/2021: ALT 14; B Natriuretic Peptide 250.8; BUN 13; Creatinine, Ser 0.96; Hemoglobin 13.0; Platelets 221; Potassium 3.9; Sodium 142 01/08/2022: TSH 4.02   Lipid Panel    Component Value Date/Time   CHOL 138 11/17/2021 1143   TRIG 97.0 11/17/2021 1143   HDL 52.50 11/17/2021 1143   CHOLHDL 3 11/17/2021 1143   VLDL 19.4 11/17/2021 1143   LDLCALC 66 11/17/2021 1143     Wt Readings from Last 3 Encounters:  01/12/22 177 lb 6.4 oz (80.5 kg)  01/08/22 178 lb (80.7 kg)  12/31/21 180 lb (81.6 kg)      Assessment and Plan:   1. Dyspnea on exertion with evidence of  coronary calcification on CT scan: Risk factors for CAD include age, tobacco abuse, HTN and HLD. Will arrange a coronary CTA to exclude obstructive CAD. Echo to assess LVEF and exclude structural heart disease.   Labs/ tests ordered today include:   Orders Placed This Encounter  Procedures   CT CORONARY MORPH W/CTA COR W/SCORE W/CA W/CM &/OR WO/CM    Disposition:   F/U with me in 4-8 weeks  Signed, Verne Carrow, MD 01/12/2022 10:34 AM    [facility] [address] [place], [place], [place]  [address] Phone: [phone]; Fax: [phone]

## 2022-01-14 ENCOUNTER — Other Ambulatory Visit: Payer: Self-pay

## 2022-01-14 ENCOUNTER — Ambulatory Visit
Admission: RE | Admit: 2022-01-14 | Discharge: 2022-01-14 | Disposition: A | Discharge status: Home / Self Care | Payer: Medicare PPO | Source: Ambulatory Visit | Attending: Acute Care | Admitting: Acute Care

## 2022-01-14 DIAGNOSIS — Z87891 Personal history of nicotine dependence: Secondary | ICD-10-CM | POA: Diagnosis not present

## 2022-01-14 DIAGNOSIS — F17210 Nicotine dependence, cigarettes, uncomplicated: Secondary | ICD-10-CM | POA: Insufficient documentation

## 2022-01-16 ENCOUNTER — Other Ambulatory Visit: Payer: Self-pay

## 2022-01-16 DIAGNOSIS — F17210 Nicotine dependence, cigarettes, uncomplicated: Secondary | ICD-10-CM

## 2022-01-16 DIAGNOSIS — Z87891 Personal history of nicotine dependence: Secondary | ICD-10-CM

## 2022-01-19 DIAGNOSIS — J301 Allergic rhinitis due to pollen: Secondary | ICD-10-CM | POA: Diagnosis not present

## 2022-01-20 ENCOUNTER — Other Ambulatory Visit: Payer: Self-pay | Admitting: Family

## 2022-01-20 ENCOUNTER — Other Ambulatory Visit: Payer: Self-pay

## 2022-01-20 ENCOUNTER — Telehealth: Payer: Self-pay

## 2022-01-20 ENCOUNTER — Ambulatory Visit (INDEPENDENT_AMBULATORY_CARE_PROVIDER_SITE_OTHER): Payer: Medicare PPO

## 2022-01-20 DIAGNOSIS — R5383 Other fatigue: Secondary | ICD-10-CM

## 2022-01-20 DIAGNOSIS — E559 Vitamin D deficiency, unspecified: Secondary | ICD-10-CM | POA: Diagnosis not present

## 2022-01-20 DIAGNOSIS — R0602 Shortness of breath: Secondary | ICD-10-CM

## 2022-01-20 DIAGNOSIS — R0683 Snoring: Secondary | ICD-10-CM

## 2022-01-20 DIAGNOSIS — E538 Deficiency of other specified B group vitamins: Secondary | ICD-10-CM | POA: Diagnosis not present

## 2022-01-20 DIAGNOSIS — R0609 Other forms of dyspnea: Secondary | ICD-10-CM

## 2022-01-20 MED ORDER — CYANOCOBALAMIN 1000 MCG/ML IJ SOLN
1000.0000 ug | Freq: Once | INTRAMUSCULAR | Status: AC
  Administered 2022-01-20: 1000 ug via INTRAMUSCULAR

## 2022-01-20 NOTE — Progress Notes (Signed)
Mb  °

## 2022-01-20 NOTE — Progress Notes (Signed)
Pt came in today to receive 1 st vit b12 injection. Pt was given IM Vit B12 1,000 mcg/ml injection in right Deltoid successfully w/o any concerns. Pt has been scheduled for follow up injections.  ?

## 2022-01-20 NOTE — Telephone Encounter (Signed)
Pt came in for her vit b12  injection and mention that she was suppose to be set up for a sleep study after she checking  w/ her insurance company . Pt said that she was told that she will have a $40  co pay . She would like to move forward to have sleep study done.. Please advise  ?

## 2022-01-21 ENCOUNTER — Ambulatory Visit
Admission: RE | Admit: 2022-01-21 | Discharge: 2022-01-21 | Disposition: A | Discharge status: Home / Self Care | Payer: Medicare PPO | Source: Ambulatory Visit | Attending: Family | Admitting: Family

## 2022-01-21 ENCOUNTER — Telehealth: Payer: Self-pay

## 2022-01-21 DIAGNOSIS — R079 Chest pain, unspecified: Secondary | ICD-10-CM | POA: Diagnosis not present

## 2022-01-21 DIAGNOSIS — I10 Essential (primary) hypertension: Secondary | ICD-10-CM | POA: Diagnosis not present

## 2022-01-21 DIAGNOSIS — R7989 Other specified abnormal findings of blood chemistry: Secondary | ICD-10-CM | POA: Diagnosis not present

## 2022-01-21 DIAGNOSIS — I340 Nonrheumatic mitral (valve) insufficiency: Secondary | ICD-10-CM | POA: Insufficient documentation

## 2022-01-21 DIAGNOSIS — R062 Wheezing: Secondary | ICD-10-CM | POA: Diagnosis present

## 2022-01-21 DIAGNOSIS — F17200 Nicotine dependence, unspecified, uncomplicated: Secondary | ICD-10-CM | POA: Diagnosis not present

## 2022-01-21 DIAGNOSIS — R0602 Shortness of breath: Secondary | ICD-10-CM | POA: Diagnosis not present

## 2022-01-21 DIAGNOSIS — E785 Hyperlipidemia, unspecified: Secondary | ICD-10-CM | POA: Diagnosis not present

## 2022-01-21 DIAGNOSIS — R0609 Other forms of dyspnea: Secondary | ICD-10-CM

## 2022-01-21 LAB — ECHOCARDIOGRAM COMPLETE
AR max vel: 2.72 cm2
AV Area VTI: 2.62 cm2
AV Area mean vel: 2.49 cm2
AV Mean grad: 4 mmHg
AV Peak grad: 6.1 mmHg
Ao pk vel: 1.23 m/s
Area-P 1/2: 3.61 cm2
MV VTI: 2.02 cm2
S' Lateral: 2.55 cm

## 2022-01-21 NOTE — Telephone Encounter (Signed)
Left message to return call to our office.  

## 2022-01-21 NOTE — Progress Notes (Signed)
*  PRELIMINARY RESULTS* ?Echocardiogram ?2D Echocardiogram has been performed. ? ?Heather Beasley [name] ?01/21/2022, 11:28 AM ?

## 2022-01-23 ENCOUNTER — Telehealth (HOSPITAL_COMMUNITY): Payer: Medicare PPO | Admitting: *Deleted

## 2022-01-23 NOTE — Telephone Encounter (Signed)
Attempted to call patient regarding upcoming cardiac CT appointment. °Left message on voicemail with name and callback number ° °Puneet Masoner RN Navigator Cardiac Imaging °[facility] of [facility] °[phone] Office °[phone] Cell ° °

## 2022-01-26 ENCOUNTER — Ambulatory Visit (HOSPITAL_COMMUNITY)
Admission: RE | Admit: 2022-01-26 | Discharge: 2022-01-26 | Disposition: A | Discharge status: Home / Self Care | Payer: Medicare PPO | Source: Ambulatory Visit | Attending: Cardiovascular Disease | Admitting: Cardiovascular Disease

## 2022-01-26 ENCOUNTER — Encounter (HOSPITAL_COMMUNITY): Payer: Self-pay

## 2022-01-26 DIAGNOSIS — R0609 Other forms of dyspnea: Secondary | ICD-10-CM | POA: Insufficient documentation

## 2022-01-26 DIAGNOSIS — I2510 Atherosclerotic heart disease of native coronary artery without angina pectoris: Secondary | ICD-10-CM | POA: Insufficient documentation

## 2022-01-26 DIAGNOSIS — J301 Allergic rhinitis due to pollen: Secondary | ICD-10-CM | POA: Diagnosis not present

## 2022-01-26 MED ORDER — NITROGLYCERIN 0.4 MG SL SUBL
SUBLINGUAL_TABLET | SUBLINGUAL | Status: AC
  Filled 2022-01-26: qty 2

## 2022-01-26 MED ORDER — IOHEXOL 350 MG/ML SOLN
100.0000 mL | Freq: Once | INTRAVENOUS | Status: AC | PRN
  Administered 2022-01-26: 100 mL via INTRAVENOUS

## 2022-01-26 MED ORDER — NITROGLYCERIN 0.4 MG SL SUBL
0.8000 mg | SUBLINGUAL_TABLET | Freq: Once | SUBLINGUAL | Status: AC
Start: 2022-01-26 — End: 2022-01-26
  Administered 2022-01-26: 0.8 mg via SUBLINGUAL

## 2022-01-26 NOTE — Progress Notes (Signed)
CT scan completed. Toleratd well. D/c home ambulatory, awake and alert. In no distress ?

## 2022-01-27 ENCOUNTER — Encounter: Payer: Self-pay | Admitting: Pulmonary Disease

## 2022-01-27 ENCOUNTER — Ambulatory Visit: Payer: Medicare PPO | Admitting: Pulmonary Disease

## 2022-01-27 ENCOUNTER — Other Ambulatory Visit (INDEPENDENT_AMBULATORY_CARE_PROVIDER_SITE_OTHER): Payer: Self-pay | Admitting: Nurse Practitioner

## 2022-01-27 VITALS — BP 136/80 | HR 86 | Temp 97.5°F | Ht 65.0 in | Wt 179.4 lb

## 2022-01-27 DIAGNOSIS — R0602 Shortness of breath: Secondary | ICD-10-CM | POA: Diagnosis not present

## 2022-01-27 DIAGNOSIS — Z9189 Other specified personal risk factors, not elsewhere classified: Secondary | ICD-10-CM | POA: Diagnosis not present

## 2022-01-27 DIAGNOSIS — J449 Chronic obstructive pulmonary disease, unspecified: Secondary | ICD-10-CM | POA: Diagnosis not present

## 2022-01-27 DIAGNOSIS — R911 Solitary pulmonary nodule: Secondary | ICD-10-CM | POA: Diagnosis not present

## 2022-01-27 NOTE — Progress Notes (Signed)
? ?Subjective:  ? ? Patient ID: Heather Beasley, female    DOB: [birth year], 66 y.o.   MRN: [id number] ?Patient Care Team: ?Eugenia Pancoast, FNP as PCP - General (Family Medicine) ?Burnell Blanks, MD as PCP - Cardiology (Cardiology) ?Earnie Larsson, MD (Neurosurgery) ?Leeroy Cha, MD (Neurosurgery) ?Noreene Filbert, MD as Referring Physician (Radiation Oncology) ?Beverly Gust, MD (Otolaryngology) ? ?Chief Complaint  ?Patient presents with  ? pulmomary consult  ?  Covid 11/2021. CT 01/16/2022.  ?SOB when laying flat.   ? ? ?HPI ?Sundra Haddix is a 66 year old current smoker (34 PY) who presents for evaluation of an apparent lung nodule.  She is referred by Dr. Lurline Hare, her primary care provider is Eugenia Pancoast, FNP.  In reviewing the patient's imaging it is noted that she had a CT angio for PE evaluation on 31 December 2021 for evaluation of shortness of breath post COVID-19 with suspicions for potential PE.  There was a 6 mm left lower lobe subsolid lung nodule measuring 6 mm that was "slightly larger than prior film of January 2022.  She also had some mild bibasilar atelectasis without pneumonia and no evidence of pulmonary embolus.  However patient had her regular lung cancer screening CT on 14 January 2022 showing the subsolid nodule that had been previously noted as well.  The film was categorized as a lung RADS 2.  This will need to be followed up during her regular lung cancer screening. ? ?The patient main complaint for [place] today is that of orthopnea.  She had COVID-19 in February 2023 and since then has noted the symptom.  She also notes shortness of breath on exertion.  She has had to sleep in a recliner due to her significant orthopnea.  She was also having issues with nocturnal awakenings "gasping for air".  She is currently on Advair and as needed albuterol.  Has been on these medications for quite some time, she cannot tell me how long.  They do not alleviate her shortness of breath.  She has not  had any wheezing, no cough.  She does note nonrestorative sleep.  Has been told she snores. ? ?She had a 2D echo performed on 21 January 2022 which shows an EF of 70 to 75%, hyperdynamic LV, no wall motion abnormalities, moderate ASH and grade 1 diastolic dysfunction.  There is mild mitral valve regurgitation.  On a subsequent coronary CTA he was noted to have a PFO with shunting.  She also has mild coronary artery disease. ? ?Of note she had nasal polypectomy in 2016 and the specimen was shown to have adenocarcinoma.  This was a stage I right nasal cavity adenocarcinoma she had adjuvant XRT in 2016 she did not require chemotherapy.  She has not had any evidence of recurrence or progressive disease since then.  She has been followed by oncology in this regard. ? ? ?Review of Systems ?A 10 point review of systems was performed and it is as noted above otherwise negative. ? ?Past Medical History:  ?Diagnosis Date  ? Allergic rhinitis   ? Arthritis   ? Asthma   ? Cancer of nasal cavity and sinus ([place]) 07/07/2015  ? radiation  ? Cervical spondylosis   ? Colitis   ? Colon polyps   ? Depression   ? Diverticulosis   ? GERD (gastroesophageal reflux disease)   ? Glaucoma   ? Headaches, cluster   ? HTN (hypertension)   ? Hyperlipidemia   ? IBS (irritable bowel syndrome)   ?  Kidney stones   ? Melanoma ([place])   ? above the L knee   ? Neuropathy   ? PONV (postoperative nausea and vomiting)   ? Tobacco abuse   ? ?Past Surgical History:  ?Procedure Laterality Date  ? ABDOMINAL HYSTERECTOMY  1998  ? APPENDECTOMY  1998  ? [place] REPAIR  2001  ? BACK SURGERY    ? CAROTID PTA/STENT INTERVENTION Left 06/27/2020  ? Procedure: CAROTID PTA/STENT INTERVENTION;  Surgeon: Algernon Huxley, MD;  Location: [facility];  Service: Cardiovascular;  Laterality: Left;  ? CERVICAL FUSION  2010  ? CHOLECYSTECTOMY  08/2011  ? Dr Rochel Brome  ? COLONOSCOPY    ? Dr Tiffany Kocher  ? CYSTOSCOPY    ? Dr. Bernardo Heater  ? DILATION AND CURETTAGE OF UTERUS    ?  ESOPHAGOGASTRODUODENOSCOPY  2012  ? Dr Tiffany Kocher  ? FRACTURE SURGERY  2001  ? plate in right wrist and arm  ? IMAGE GUIDED SINUS SURGERY N/A 06/25/2015  ? Procedure: IMAGE GUIDED SINUS SURGERY;  Surgeon: Beverly Gust, MD;  Location: [facility];  Service: ENT;  Laterality: N/A;  ? LUMBAR SPINE SURGERY  07/2004  ? OOPHORECTOMY    ? POLYPECTOMY N/A 06/25/2015  ? Procedure: POLYPECTOMY NASAL;  Surgeon: Beverly Gust, MD;  Location: [facility];  Service: ENT;  Laterality: N/A;  ? TONSILLECTOMY  1968  ? VAGINAL DELIVERY    ? x2  ? ?Patient Active Problem List  ? Diagnosis Date Noted  ? Snoring 01/20/2022  ? DOE (dyspnea on exertion) 01/08/2022  ? Shortness of breath 01/08/2022  ? Elevated brain natriuretic peptide (BNP) level 01/08/2022  ? Pulmonary emphysema ([place]) 01/08/2022  ? Pulmonary nodule less than 6 mm determined by computed tomography of lung 01/08/2022  ? Other fatigue 01/08/2022  ? Asthma exacerbation in COPD ([place]) 12/18/2021  ? Family history of breast cancer in female 11/17/2021  ? Muscle spasm of back 11/17/2021  ? Carotid stenosis, left 06/27/2020  ? Primary osteoarthritis involving multiple joints 03/01/2018  ? Osteopenia 02/14/2018  ? HLD (hyperlipidemia) 12/01/2016  ? Irritable bowel syndrome with diarrhea 12/01/2016  ? Cancer of nasal cavity and sinus ([place]) 07/07/2015  ? Encounter for screening mammogram for malignant neoplasm of breast 07/04/2014  ? Essential hypertension, benign 08/08/2013  ? Mixed urge and stress incontinence 03/27/2013  ? Allergic rhinitis 12/13/2012  ? Chronic headaches 02/22/2012  ? GERD (gastroesophageal reflux disease) 02/22/2012  ? Vitamin D deficiency 01/04/2012  ? ?Family History  ?Problem Relation Age of Onset  ? Hypertension Mother   ? Hyperlipidemia Mother   ? Diabetes Mother   ? Colon cancer Father   ? Pancreatic cancer Father   ? Cancer Father   ?     Colon & Pancreatic  ? Breast cancer Paternal Grandmother 52  ? Diabetes Paternal Grandmother   ? Coronary artery disease  Paternal Grandmother   ? Heart failure Paternal Grandmother   ? Cancer Paternal Grandmother   ?     breast  ? Stroke Brother   ? ?Social History  ? ?Tobacco Use  ? Smoking status: Every Day  ?  Packs/day: 1.00  ?  Years: 39.00  ?  Pack years: 39.00  ?  Types: Cigarettes  ? Smokeless tobacco: Never  ? Tobacco comments:  ?  4 cigs daily trying to quit 01/27/2022  ?Substance Use Topics  ? Alcohol use: No  ? ?Allergies  ?Allergen Reactions  ? Albumin (Human) Rash  ? Amoxicillin-Pot Clavulanate Hives  ?  Gabapentin Shortness Of Breath  ? Latex Dermatitis and Rash  ? Betadine [Povidone Iodine] Dermatitis, Rash and Other (See Comments)  ?  Skin burning ?Topical betadine and iodine have this reaction with patient.  IV contrast is NOT a problem.  jkl  ? Sucralfate   ?  Other reaction(s): Other (See Comments) ?Abdominal Pain  ? Amoxicillin   ?  REACTION: rash  ? Chantix [Varenicline Tartrate]   ?  Heart racing  ? Erythromycin   ?  REACTION: rash  ? Sucralfate Nausea And Vomiting  ?  Heart racing ?Lightheaded ?  ? ? ?Current Meds  ?Medication Sig  ? albuterol (VENTOLIN HFA) 108 (90 Base) MCG/ACT inhaler Inhale into the lungs every 6 (six) hours as needed.  ? atorvastatin (LIPITOR) 20 MG tablet Take 1 tablet (20 mg total) by mouth daily.  ? cetirizine (ZYRTEC) 10 MG tablet Take 10 mg by mouth daily. Allergies.  ? cyclobenzaprine (FLEXERIL) 10 MG tablet Take 1 tablet (10 mg total) by mouth 3 (three) times daily as needed for muscle spasms.  ? EPINEPHrine 0.3 mg/0.3 mL IJ SOAJ injection Inject 1 mg into the skin as needed.  ? famotidine (PEPCID) 40 MG tablet Take 1 tablet by mouth at bedtime.  ? fluticasone-salmeterol (ADVAIR DISKUS) 250-50 MCG/ACT AEPB Inhale 1 puff into the lungs in the morning and at bedtime.  ? loperamide (IMODIUM A-D) 2 MG tablet Take 2 mg by mouth 2 (two) times a day.  ? losartan (COZAAR) 100 MG tablet Take 1 tablet (100 mg total) by mouth daily.  ? montelukast (SINGULAIR) 10 MG tablet Take 1 tablet (10 mg  total) by mouth at bedtime.  ? Multiple Vitamins-Minerals (CENTRUM ADULTS PO) Take 1 tablet by mouth.  ? pantoprazole (PROTONIX) 40 MG tablet Take 1 tablet (40 mg total) by mouth 2 (two) times daily.  ? VENTOLIN HFA 1

## 2022-01-27 NOTE — Patient Instructions (Signed)
We are getting breathing tests. ? ?We have ordered a sleep study for you. ? ?I am having one of our cardiologist take a look at your tests and see if any further testing needs to be done with regards to your heart. ? ?We will see you in follow-up in 3 to 4 weeks time with either me or the nurse practitioner at that time. ?

## 2022-02-02 DIAGNOSIS — J301 Allergic rhinitis due to pollen: Secondary | ICD-10-CM | POA: Diagnosis not present

## 2022-02-03 ENCOUNTER — Ambulatory Visit: Payer: Medicare PPO

## 2022-02-09 DIAGNOSIS — J301 Allergic rhinitis due to pollen: Secondary | ICD-10-CM | POA: Diagnosis not present

## 2022-02-12 ENCOUNTER — Encounter: Payer: Self-pay | Admitting: Pulmonary Disease

## 2022-02-16 DIAGNOSIS — J301 Allergic rhinitis due to pollen: Secondary | ICD-10-CM | POA: Diagnosis not present

## 2022-02-23 DIAGNOSIS — J301 Allergic rhinitis due to pollen: Secondary | ICD-10-CM | POA: Diagnosis not present

## 2022-02-24 ENCOUNTER — Ambulatory Visit (INDEPENDENT_AMBULATORY_CARE_PROVIDER_SITE_OTHER): Payer: Medicare PPO

## 2022-02-24 DIAGNOSIS — E538 Deficiency of other specified B group vitamins: Secondary | ICD-10-CM

## 2022-02-24 MED ORDER — CYANOCOBALAMIN 1000 MCG/ML IJ SOLN
1000.0000 ug | Freq: Once | INTRAMUSCULAR | Status: AC
  Administered 2022-02-24: 1000 ug via INTRAMUSCULAR

## 2022-02-24 NOTE — Progress Notes (Signed)
Per orders of Eugenia Pancoast, NP, #2 of 3 monthly B12 1000 mcg/ml injection given by Pilar Grammes, CMA in Right Deltoid. (Pt stated the last one was in her left). ?Patient tolerated injection well. ? ?

## 2022-02-27 ENCOUNTER — Ambulatory Visit: Payer: Medicare PPO | Admitting: Cardiovascular Disease

## 2022-02-27 ENCOUNTER — Encounter: Payer: Self-pay | Admitting: Cardiovascular Disease

## 2022-02-27 VITALS — BP 132/82 | HR 85 | Ht 65.0 in | Wt 178.4 lb

## 2022-02-27 DIAGNOSIS — I2510 Atherosclerotic heart disease of native coronary artery without angina pectoris: Secondary | ICD-10-CM

## 2022-02-27 DIAGNOSIS — I340 Nonrheumatic mitral (valve) insufficiency: Secondary | ICD-10-CM | POA: Diagnosis not present

## 2022-02-27 DIAGNOSIS — R0602 Shortness of breath: Secondary | ICD-10-CM

## 2022-02-27 MED ORDER — FUROSEMIDE 20 MG PO TABS: 20.0000 mg | ORAL_TABLET | Freq: Every day | ORAL | 2 refills | Status: DC

## 2022-02-27 NOTE — Patient Instructions (Signed)
Medication Instructions:  ?Your physician has recommended you make the following change in your medication:  ?1.) start furosemide (Lasix) 20 mg - take one tablet 3 -4 times per week ? ?*If you need a refill on your cardiac medications before your next appointment, please call your pharmacy* ? ? ?Lab Work: ?none ?If you have labs (blood work) drawn today and your tests are completely normal, you will receive your results only by: ?MyChart Message (if you have MyChart) OR ?A paper copy in the mail ?If you have any lab test that is abnormal or we need to change your treatment, we will call you to review the results. ? ? ?Testing/Procedures: ?none ? ? ?Follow-Up: ?At [facility], you and your health needs are our priority.  As part of our continuing mission to provide you with exceptional heart care, we have created designated Provider Care Teams.  These Care Teams include your primary Cardiologist (physician) and Advanced Practice Providers (APPs -  Physician Assistants and Nurse Practitioners) who all work together to provide you with the care you need, when you need it. ? ?We recommend signing up for the patient portal called "MyChart".  Sign up information is provided on this After Visit Summary.  MyChart is used to connect with patients for Virtual Visits (Telemedicine).  Patients are able to view lab/test results, encounter notes, upcoming appointments, etc.  Non-urgent messages can be sent to your provider as well.   ?To learn more about what you can do with MyChart, go to [email or web address].   ? ?Your next appointment:   ?12 month(s) ? ?The format for your next appointment:   ?In Person ? ?Provider:   ?Lauree Chandler, MD   ? ? ?Other Instructions ? ? ?Important Information About Sugar ? ? ? ? ?  ?

## 2022-02-27 NOTE — Progress Notes (Signed)
? ?Chief Complaint  ?Patient presents with  ? Follow-up  ?  CAD  ? ?History of Present Illness: 66 yo female with history of carotid artery disease s/p carotid artery stenting, arthritis, depression, GERD, hyperlipidemia, HTN, IBS and prior tobacco abuse here today for cardiac follow up. I saw her as a new patient for the evaluation of shortness of breath and palpitations on 01/12/22.  She was seen in the ED on 12/31/21 with c/o dyspnea. Chest CTA with no PE but coronary artery calcifications noted.She is known to have carotid artery disease felt to be due to prior radiation therapy and has undergone carotid artery stenting at [facility] in 2021. She continues to smoke.  Echo 01/21/22 with LVEF=70-75%. Moderate asymmetric septal hypertrophy. Mild mitral regurgitation. Coronary CTA 01/26/22 with mild CAD.  ? ?She is here today for follow up. The patient denies any chest pain, palpitations, lower extremity edema, orthopnea, PND, dizziness, near syncope or syncope. Still with dyspnea. She has trouble laying flat at night. She is awaiting a sleep study ? ? ?Primary Care Physician: Eugenia Pancoast, FNP ? ?Past Medical History:  ?Diagnosis Date  ? Allergic rhinitis   ? Arthritis   ? Asthma   ? Cancer of nasal cavity and sinus ([place]) 07/07/2015  ? radiation  ? Cervical spondylosis   ? Colitis   ? Colon polyps   ? Depression   ? Diverticulosis   ? GERD (gastroesophageal reflux disease)   ? Glaucoma   ? Headaches, cluster   ? HTN (hypertension)   ? Hyperlipidemia   ? IBS (irritable bowel syndrome)   ? Kidney stones   ? Melanoma ([place])   ? above the L knee   ? Neuropathy   ? PONV (postoperative nausea and vomiting)   ? Tobacco abuse   ? ? ?Past Surgical History:  ?Procedure Laterality Date  ? ABDOMINAL HYSTERECTOMY  1998  ? APPENDECTOMY  1998  ? [place] REPAIR  2001  ? BACK SURGERY    ? CAROTID PTA/STENT INTERVENTION Left 06/27/2020  ? Procedure: CAROTID PTA/STENT INTERVENTION;  Surgeon: Algernon Huxley, MD;  Location: [facility];   Service: Cardiovascular;  Laterality: Left;  ? CERVICAL FUSION  2010  ? CHOLECYSTECTOMY  08/2011  ? Dr Rochel Brome  ? COLONOSCOPY    ? Dr Tiffany Kocher  ? CYSTOSCOPY    ? Dr. Bernardo Heater  ? DILATION AND CURETTAGE OF UTERUS    ? ESOPHAGOGASTRODUODENOSCOPY  2012  ? Dr Tiffany Kocher  ? FRACTURE SURGERY  2001  ? plate in right wrist and arm  ? IMAGE GUIDED SINUS SURGERY N/A 06/25/2015  ? Procedure: IMAGE GUIDED SINUS SURGERY;  Surgeon: Beverly Gust, MD;  Location: [facility];  Service: ENT;  Laterality: N/A;  ? LUMBAR SPINE SURGERY  07/2004  ? OOPHORECTOMY    ? POLYPECTOMY N/A 06/25/2015  ? Procedure: POLYPECTOMY NASAL;  Surgeon: Beverly Gust, MD;  Location: [facility];  Service: ENT;  Laterality: N/A;  ? TONSILLECTOMY  1968  ? VAGINAL DELIVERY    ? x2  ? ? ?Current Outpatient Medications  ?Medication Sig Dispense Refill  ? albuterol (VENTOLIN HFA) 108 (90 Base) MCG/ACT inhaler Inhale into the lungs every 6 (six) hours as needed.    ? atorvastatin (LIPITOR) 20 MG tablet Take 1 tablet (20 mg total) by mouth daily. 90 tablet 1  ? cetirizine (ZYRTEC) 10 MG tablet Take 10 mg by mouth daily. Allergies.    ? clopidogrel (PLAVIX) 75 MG tablet TAKE 1 TABLET BY MOUTH  ONCE A DAY 90 tablet 3  ? cyclobenzaprine (FLEXERIL) 10 MG tablet Take 1 tablet (10 mg total) by mouth 3 (three) times daily as needed for muscle spasms. 30 tablet 0  ? EPINEPHrine 0.3 mg/0.3 mL IJ SOAJ injection Inject 1 mg into the skin as needed.    ? famotidine (PEPCID) 40 MG tablet Take 1 tablet by mouth at bedtime.    ? fluticasone-salmeterol (ADVAIR DISKUS) 250-50 MCG/ACT AEPB Inhale 1 puff into the lungs in the morning and at bedtime. 1 each 1  ? furosemide (LASIX) 20 MG tablet Take 1 tablet (20 mg total) by mouth daily. 90 tablet 2  ? loperamide (IMODIUM A-D) 2 MG tablet Take 2 mg by mouth 2 (two) times a day.    ? losartan (COZAAR) 100 MG tablet Take 1 tablet (100 mg total) by mouth daily. 90 tablet 1  ? montelukast (SINGULAIR) 10 MG tablet Take 1 tablet (10 mg total) by  mouth at bedtime. 30 tablet 3  ? Multiple Vitamins-Minerals (CENTRUM ADULTS PO) Take 1 tablet by mouth.    ? pantoprazole (PROTONIX) 40 MG tablet Take 1 tablet (40 mg total) by mouth 2 (two) times daily. 60 tablet 11  ? VENTOLIN HFA 108 (90 Base) MCG/ACT inhaler INHALE 2 PUFFS INTO THE LUNGS EVERY 6 HOURS AS NEEDED FOR WHEEZING ORSHORTNESS OF BREATH 18 g 0  ? Vitamin D, Ergocalciferol, (DRISDOL) 1.25 MG (50000 UNIT) CAPS capsule Take 1 capsule (50,000 Units total) by mouth every 7 (seven) days for 8 doses. 8 capsule 0  ? ?No current facility-administered medications for this visit.  ? ? ?Allergies  ?Allergen Reactions  ? Albumin (Human) Rash  ? Amoxicillin-Pot Clavulanate Hives  ? Gabapentin Shortness Of Breath  ? Latex Dermatitis and Rash  ? Betadine [Povidone Iodine] Dermatitis, Rash and Other (See Comments)  ?  Skin burning ?Topical betadine and iodine have this reaction with patient.  IV contrast is NOT a problem.  jkl  ? Sucralfate   ?  Other reaction(s): Other (See Comments) ?Abdominal Pain  ? Amoxicillin   ?  REACTION: rash  ? Chantix [Varenicline Tartrate]   ?  Heart racing  ? Erythromycin   ?  REACTION: rash  ? Sucralfate Nausea And Vomiting  ?  Heart racing ?Lightheaded ?  ? ? ?Social History  ? ?Socioeconomic History  ? Marital status: Married  ?  Spouse name: Not on file  ? Number of children: 2  ? Years of education: Not on file  ? Highest education level: Not on file  ?Occupational History  ? Occupation: Scientist, water quality  ? Occupation: disabled  ?Tobacco Use  ? Smoking status: Every Day  ?  Packs/day: 1.00  ?  Years: 39.00  ?  Pack years: 39.00  ?  Types: Cigarettes  ? Smokeless tobacco: Never  ? Tobacco comments:  ?  4 cigs daily trying to quit 01/27/2022  ?Substance and Sexual Activity  ? Alcohol use: No  ? Drug use: No  ? Sexual activity: Not Currently  ?  Partners: Male  ?Other Topics Concern  ? Not on file  ?Social History Narrative  ? Lives in [place] with husband. Dog in home. Work - disabled for neck  and back pain.  ? ?Social Determinants of Health  ? ?Financial Resource Strain: Not on file  ?Food Insecurity: Not on file  ?Transportation Needs: Not on file  ?Physical Activity: Not on file  ?Stress: Not on file  ?Social Connections: Not on file  ?Intimate Partner  Violence: Not on file  ? ? ?Family History  ?Problem Relation Age of Onset  ? Hypertension Mother   ? Hyperlipidemia Mother   ? Diabetes Mother   ? Colon cancer Father   ? Pancreatic cancer Father   ? Cancer Father   ?     Colon & Pancreatic  ? Breast cancer Paternal Grandmother 106  ? Diabetes Paternal Grandmother   ? Coronary artery disease Paternal Grandmother   ? Heart failure Paternal Grandmother   ? Cancer Paternal Grandmother   ?     breast  ? Stroke Brother   ? ? ?Review of Systems:  As stated in the HPI and otherwise negative.  ? ?BP 132/82   Pulse 85   Ht '5\' 5"'$  (1.651 m)   Wt 178 lb 6.4 oz (80.9 kg)   SpO2 97%   BMI 29.69 kg/m?  ? ?Physical Examination: ? ?General: Well developed, well nourished, NAD  ?HEENT: OP clear, mucus membranes moist  ?SKIN: warm, dry. No rashes. ?Neuro: No focal deficits  ?Musculoskeletal: Muscle strength 5/5 all ext  ?Psychiatric: Mood and affect normal  ?Neck: No JVD, no carotid bruits, no thyromegaly, no lymphadenopathy.  ?Lungs:Clear bilaterally, no wheezes, rhonci, crackles ?Cardiovascular: Regular rate and rhythm. No murmurs, gallops or rubs. ?Abdomen:Soft. Bowel sounds present. Non-tender.  ?Extremities: No lower extremity edema. Pulses are 2 + in the bilateral DP/PT. ? ?EKG:  EKG is not ordered today. ?The ekg ordered today demonstrates  ? ?Echo 01/21/22: ? 1. Left ventricular ejection fraction, by estimation, is 70 to 75%. The  ?left ventricle has hyperdynamic function. The left ventricle has no  ?regional wall motion abnormalities. There is moderate asymmetric left  ?ventricular hypertrophy of the septal  ?segment. Left ventricular diastolic parameters are consistent with Grade I  ?diastolic dysfunction  (impaired relaxation).  ? 2. Right ventricular systolic function is normal. The right ventricular  ?size is normal. Tricuspid regurgitation signal is inadequate for assessing  ?PA pressure.  ? 3. The mitral valve

## 2022-03-02 ENCOUNTER — Ambulatory Visit
Admission: RE | Admit: 2022-03-02 | Discharge: 2022-03-02 | Disposition: A | Discharge status: Home / Self Care | Payer: Medicare PPO | Source: Ambulatory Visit | Attending: Family | Admitting: Family

## 2022-03-02 DIAGNOSIS — Z923 Personal history of irradiation: Secondary | ICD-10-CM | POA: Diagnosis not present

## 2022-03-02 DIAGNOSIS — Z13820 Encounter for screening for osteoporosis: Secondary | ICD-10-CM | POA: Insufficient documentation

## 2022-03-02 DIAGNOSIS — Z780 Asymptomatic menopausal state: Secondary | ICD-10-CM | POA: Insufficient documentation

## 2022-03-02 DIAGNOSIS — J449 Chronic obstructive pulmonary disease, unspecified: Secondary | ICD-10-CM | POA: Diagnosis not present

## 2022-03-02 DIAGNOSIS — M8580 Other specified disorders of bone density and structure, unspecified site: Secondary | ICD-10-CM | POA: Insufficient documentation

## 2022-03-02 DIAGNOSIS — M85852 Other specified disorders of bone density and structure, left thigh: Secondary | ICD-10-CM | POA: Diagnosis not present

## 2022-03-02 DIAGNOSIS — J301 Allergic rhinitis due to pollen: Secondary | ICD-10-CM | POA: Diagnosis not present

## 2022-03-02 DIAGNOSIS — M8589 Other specified disorders of bone density and structure, multiple sites: Secondary | ICD-10-CM | POA: Diagnosis not present

## 2022-03-03 NOTE — Patient Instructions (Signed)
Heather Beasley , ?Thank you for taking time to come for your Medicare Wellness Visit. I appreciate your ongoing commitment to your health goals. Please review the following plan we discussed and let me know if I can assist you in the future.  ? ?Screening recommendations/referrals: ?Colonoscopy: Done 03/09/2014 Repeat in 10 years ? ?Mammogram: Done 01/01/2022. Repeat annually ? ?Bone Density:  ?Recommended yearly ophthalmology/optometry visit for glaucoma screening and checkup ?Recommended yearly dental visit for hygiene and checkup ? ?Vaccinations: ?Influenza vaccine: Due Fall 2023. ?Pneumococcal vaccine: Done 07/16/2016 and 03/01/2018 ?Tdap vaccine: Due Repeat in 10 years ? ?Shingles vaccine: Discussed.  ?Covid-19: Declined. ? ?Advanced directives: Advance directive discussed with you today. Even though you declined this today, please call our office should you change your mind, and we can give you the proper paperwork for you to fill out. ? ? ?Conditions/risks identified: Aim for 30 minutes of exercise, 6-8 glasses of water, and 5 servings of fruits and vegetables each day. ? ? ?Next appointment: Follow up in one year for your annual wellness visit. 2024 ? ?Preventive Care 40-64 Years, Female ?Preventive care refers to lifestyle choices and visits with your health care provider that can promote health and wellness. ?What does preventive care include? ?A yearly physical exam. This is also called an annual well check. ?Dental exams once or twice a year. ?Routine eye exams. Ask your health care provider how often you should have your eyes checked. ?Personal lifestyle choices, including: ?Daily care of your teeth and gums. ?Regular physical activity. ?Eating a healthy diet. ?Avoiding tobacco and drug use. ?Limiting alcohol use. ?Practicing safe sex. ?Taking low-dose aspirin daily starting at age 75. ?Taking vitamin and mineral supplements as recommended by your health care provider. ?What happens during an annual well  check? ?The services and screenings done by your health care provider during your annual well check will depend on your age, overall health, lifestyle risk factors, and family history of disease. ?Counseling  ?Your health care provider may ask you questions about your: ?Alcohol use. ?Tobacco use. ?Drug use. ?Emotional well-being. ?Home and relationship well-being. ?Sexual activity. ?Eating habits. ?Work and work Statistician. ?Method of birth control. ?Menstrual cycle. ?Pregnancy history. ?Screening  ?You may have the following tests or measurements: ?Height, weight, and BMI. ?Blood pressure. ?Lipid and cholesterol levels. These may be checked every 5 years, or more frequently if you are over 43 years old. ?Skin check. ?Lung cancer screening. You may have this screening every year starting at age 32 if you have a 30-pack-year history of smoking and currently smoke or have quit within the past 15 years. ?Fecal occult blood test (FOBT) of the stool. You may have this test every year starting at age 14. ?Flexible sigmoidoscopy or colonoscopy. You may have a sigmoidoscopy every 5 years or a colonoscopy every 10 years starting at age 70. ?Hepatitis C blood test. ?Hepatitis B blood test. ?Sexually transmitted disease (STD) testing. ?Diabetes screening. This is done by checking your blood sugar (glucose) after you have not eaten for a while (fasting). You may have this done every 1-3 years. ?Mammogram. This may be done every 1-2 years. Talk to your health care provider about when you should start having regular mammograms. This may depend on whether you have a family history of breast cancer. ?BRCA-related cancer screening. This may be done if you have a family history of breast, ovarian, tubal, or peritoneal cancers. ?Pelvic exam and Pap test. This may be done every 3 years starting at age 50.  Starting at age 4, this may be done every 5 years if you have a Pap test in combination with an HPV test. ?Bone density scan. This  is done to screen for osteoporosis. You may have this scan if you are at high risk for osteoporosis. ?Discuss your test results, treatment options, and if necessary, the need for more tests with your health care provider. ?Vaccines  ?Your health care provider may recommend certain vaccines, such as: ?Influenza vaccine. This is recommended every year. ?Tetanus, diphtheria, and acellular pertussis (Tdap, Td) vaccine. You may need a Td booster every 10 years. ?Zoster vaccine. You may need this after age 45. ?Pneumococcal 13-valent conjugate (PCV13) vaccine. You may need this if you have certain conditions and were not previously vaccinated. ?Pneumococcal polysaccharide (PPSV23) vaccine. You may need one or two doses if you smoke cigarettes or if you have certain conditions. ?Talk to your health care provider about which screenings and vaccines you need and how often you need them. ?This information is not intended to replace advice given to you by your health care provider. Make sure you discuss any questions you have with your health care provider. ?Document Released: 11/08/2015 Document Revised: 07/01/2016 Document Reviewed: 08/13/2015 ?Elsevier Interactive Patient Education ? 2017 [employer] ? ? ? ?Fall Prevention in the Home ?Falls can cause injuries. They can happen to people of all ages. There are many things you can do to make your home safe and to help prevent falls. ?What can I do on the outside of my home? ?Regularly fix the edges of walkways and driveways and fix any cracks. ?Remove anything that might make you trip as you walk through a door, such as a raised step or threshold. ?Trim any bushes or trees on the path to your home. ?Use bright outdoor lighting. ?Clear any walking paths of anything that might make someone trip, such as rocks or tools. ?Regularly check to see if handrails are loose or broken. Make sure that both sides of any steps have handrails. ?Any raised decks and porches should have  guardrails on the edges. ?Have any leaves, snow, or ice cleared regularly. ?Use sand or salt on walking paths during winter. ?Clean up any spills in your garage right away. This includes oil or grease spills. ?What can I do in the bathroom? ?Use night lights. ?Install grab bars by the toilet and in the tub and shower. Do not use towel bars as grab bars. ?Use non-skid mats or decals in the tub or shower. ?If you need to sit down in the shower, use a plastic, non-slip stool. ?Keep the floor dry. Clean up any water that spills on the floor as soon as it happens. ?Remove soap buildup in the tub or shower regularly. ?Attach bath mats securely with double-sided non-slip rug tape. ?Do not have throw rugs and other things on the floor that can make you trip. ?What can I do in the bedroom? ?Use night lights. ?Make sure that you have a light by your bed that is easy to reach. ?Do not use any sheets or blankets that are too big for your bed. They should not hang down onto the floor. ?Have a firm chair that has side arms. You can use this for support while you get dressed. ?Do not have throw rugs and other things on the floor that can make you trip. ?What can I do in the kitchen? ?Clean up any spills right away. ?Avoid walking on wet floors. ?Keep items that you use  a lot in easy-to-reach places. ?If you need to reach something above you, use a strong step stool that has a grab bar. ?Keep electrical cords out of the way. ?Do not use floor polish or wax that makes floors slippery. If you must use wax, use non-skid floor wax. ?Do not have throw rugs and other things on the floor that can make you trip. ?What can I do with my stairs? ?Do not leave any items on the stairs. ?Make sure that there are handrails on both sides of the stairs and use them. Fix handrails that are broken or loose. Make sure that handrails are as long as the stairways. ?Check any carpeting to make sure that it is firmly attached to the stairs. Fix any carpet  that is loose or worn. ?Avoid having throw rugs at the top or bottom of the stairs. If you do have throw rugs, attach them to the floor with carpet tape. ?Make sure that you have a light switch at the top of th

## 2022-03-04 ENCOUNTER — Telehealth: Payer: Self-pay

## 2022-03-04 ENCOUNTER — Ambulatory Visit (INDEPENDENT_AMBULATORY_CARE_PROVIDER_SITE_OTHER): Payer: Medicare PPO

## 2022-03-04 VITALS — Ht 65.0 in | Wt 178.0 lb

## 2022-03-04 DIAGNOSIS — Z0000 Encounter for general adult medical examination without abnormal findings: Secondary | ICD-10-CM | POA: Diagnosis not present

## 2022-03-04 NOTE — Progress Notes (Signed)
? ?Subjective:  ? Heather Beasley is a 66 y.o. female who presents for Medicare Annual (Subsequent) preventive examination. ?Virtual Visit via Telephone Note ? ?I connected with  Heather Beasley on 03/04/22 at 12:00 PM EDT by telephone and verified that I am speaking with the correct person using two identifiers. ? ?Location: ?Patient: HOME ?Provider: [facility] ?Persons participating in the virtual visit: patient/Nurse Health Advisor ?  ?I discussed the limitations, risks, security and privacy concerns of performing an evaluation and management service by telephone and the availability of in person appointments. The patient expressed understanding and agreed to proceed. ? ?Interactive audio and video telecommunications were attempted between this nurse and patient, however failed, due to patient having technical difficulties OR patient did not have access to video capability.  We continued and completed visit with audio only. ? ?Some vital signs may be absent or patient reported.  ? ?Chriss Driver, LPN ? ?Review of Systems    ? ?Cardiac Risk Factors include: advanced age (>49mn, >>32women);hypertension;dyslipidemia;smoking/ tobacco exposure;sedentary lifestyle;Other (see comment), Risk factor comments: Emphysema. ? ?   ?Objective:  ?  ?Today's Vitals  ? 03/04/22 1152 03/04/22 1154  ?Weight: 178 lb (80.7 kg)   ?Height: '5\' 5"'$  (1.651 m)   ?PainSc:  5   ? ?Body mass index is 29.62 kg/m?. ? ? ?  03/04/2022  ? 12:05 PM 12/31/2021  ? 10:03 PM 06/27/2020  ?  6:48 PM 05/25/2019  ? 10:27 AM 08/23/2018  ? 10:28 AM 02/14/2018  ? 10:33 AM 08/08/2017  ?  9:03 AM  ?Advanced Directives  ?Does Patient Have a Medical Advance Directive? No No No No No No No  ?Would patient like information on creating a medical advance directive? No - Patient declined  No - Patient declined No - Patient declined     ? ? ?Current Medications (verified) ?Outpatient Encounter Medications as of 03/04/2022  ?Medication Sig  ? albuterol (VENTOLIN HFA) 108 (90  Base) MCG/ACT inhaler Inhale into the lungs every 6 (six) hours as needed.  ? atorvastatin (LIPITOR) 20 MG tablet Take 1 tablet (20 mg total) by mouth daily.  ? Azelastine HCl 137 MCG/SPRAY SOLN Place into both nostrils.  ? cetirizine (ZYRTEC) 10 MG tablet Take 10 mg by mouth daily. Allergies.  ? clopidogrel (PLAVIX) 75 MG tablet TAKE 1 TABLET BY MOUTH ONCE A DAY  ? cyclobenzaprine (FLEXERIL) 10 MG tablet Take 1 tablet (10 mg total) by mouth 3 (three) times daily as needed for muscle spasms.  ? EPINEPHrine 0.3 mg/0.3 mL IJ SOAJ injection Inject 1 mg into the skin as needed.  ? famotidine (PEPCID) 40 MG tablet Take 1 tablet by mouth at bedtime.  ? fluticasone-salmeterol (ADVAIR DISKUS) 250-50 MCG/ACT AEPB Inhale 1 puff into the lungs in the morning and at bedtime.  ? furosemide (LASIX) 20 MG tablet Take 1 tablet (20 mg total) by mouth daily.  ? loperamide (IMODIUM A-D) 2 MG tablet Take 2 mg by mouth 2 (two) times a day.  ? losartan (COZAAR) 100 MG tablet Take 1 tablet (100 mg total) by mouth daily.  ? montelukast (SINGULAIR) 10 MG tablet Take 1 tablet (10 mg total) by mouth at bedtime.  ? Multiple Vitamins-Minerals (CENTRUM ADULTS PO) Take 1 tablet by mouth.  ? pantoprazole (PROTONIX) 40 MG tablet Take 1 tablet (40 mg total) by mouth 2 (two) times daily.  ? VENTOLIN HFA 108 (90 Base) MCG/ACT inhaler INHALE 2 PUFFS INTO THE LUNGS EVERY 6 HOURS AS NEEDED  FOR WHEEZING ORSHORTNESS OF BREATH  ? ?No facility-administered encounter medications on file as of 03/04/2022.  ? ? ?Allergies (verified) ?Albumin (human), Amoxicillin-pot clavulanate, Gabapentin, Latex, Betadine [povidone iodine], Sucralfate, Amoxicillin, Chantix [varenicline tartrate], Erythromycin, and Sucralfate  ? ?History: ?Past Medical History:  ?Diagnosis Date  ? Allergic rhinitis   ? Arthritis   ? Asthma   ? Cancer of nasal cavity and sinus ([place]) 07/07/2015  ? radiation  ? Cervical spondylosis   ? Colitis   ? Colon polyps   ? Depression   ? Diverticulosis   ?  GERD (gastroesophageal reflux disease)   ? Glaucoma   ? Headaches, cluster   ? HTN (hypertension)   ? Hyperlipidemia   ? IBS (irritable bowel syndrome)   ? Kidney stones   ? Melanoma ([place])   ? above the L knee   ? Neuropathy   ? PONV (postoperative nausea and vomiting)   ? Tobacco abuse   ? ?Past Surgical History:  ?Procedure Laterality Date  ? ABDOMINAL HYSTERECTOMY  1998  ? APPENDECTOMY  1998  ? [place] REPAIR  2001  ? BACK SURGERY    ? CAROTID PTA/STENT INTERVENTION Left 06/27/2020  ? Procedure: CAROTID PTA/STENT INTERVENTION;  Surgeon: Algernon Huxley, MD;  Location: [facility];  Service: Cardiovascular;  Laterality: Left;  ? CERVICAL FUSION  2010  ? CHOLECYSTECTOMY  08/2011  ? Dr Rochel Brome  ? COLONOSCOPY    ? Dr Tiffany Kocher  ? CYSTOSCOPY    ? Dr. Bernardo Heater  ? DILATION AND CURETTAGE OF UTERUS    ? ESOPHAGOGASTRODUODENOSCOPY  2012  ? Dr Tiffany Kocher  ? FRACTURE SURGERY  2001  ? plate in right wrist and arm  ? IMAGE GUIDED SINUS SURGERY N/A 06/25/2015  ? Procedure: IMAGE GUIDED SINUS SURGERY;  Surgeon: Beverly Gust, MD;  Location: [facility];  Service: ENT;  Laterality: N/A;  ? LUMBAR SPINE SURGERY  07/2004  ? OOPHORECTOMY    ? POLYPECTOMY N/A 06/25/2015  ? Procedure: POLYPECTOMY NASAL;  Surgeon: Beverly Gust, MD;  Location: [facility];  Service: ENT;  Laterality: N/A;  ? TONSILLECTOMY  1968  ? VAGINAL DELIVERY    ? x2  ? ?Family History  ?Problem Relation Age of Onset  ? Hypertension Mother   ? Hyperlipidemia Mother   ? Diabetes Mother   ? Colon cancer Father   ? Pancreatic cancer Father   ? Cancer Father   ?     Colon & Pancreatic  ? Breast cancer Paternal Grandmother 41  ? Diabetes Paternal Grandmother   ? Coronary artery disease Paternal Grandmother   ? Heart failure Paternal Grandmother   ? Cancer Paternal Grandmother   ?     breast  ? Stroke Brother   ? ?Social History  ? ?Socioeconomic History  ? Marital status: Married  ?  Spouse name: Not on file  ? Number of children: 2  ? Years of education: Not on file  ?  Highest education level: Not on file  ?Occupational History  ? Occupation: Scientist, water quality  ? Occupation: disabled  ?Tobacco Use  ? Smoking status: Every Day  ?  Packs/day: 1.00  ?  Years: 39.00  ?  Pack years: 39.00  ?  Types: Cigarettes  ? Smokeless tobacco: Never  ? Tobacco comments:  ?  4 cigs daily trying to quit 01/27/2022  ?Substance and Sexual Activity  ? Alcohol use: No  ? Drug use: No  ? Sexual activity: Not Currently  ?  Partners: Male  ?  Other Topics Concern  ? Not on file  ?Social History Narrative  ? Lives in [place] with husband. Dog in home. Work - disabled for neck and back pain.  ? ?Social Determinants of Health  ? ?Financial Resource Strain: Low Risk   ? Difficulty of Paying Living Expenses: Not hard at all  ?Food Insecurity: No Food Insecurity  ? Worried About Charity fundraiser in the Last Year: Never true  ? Ran Out of Food in the Last Year: Never true  ?Transportation Needs: No Transportation Needs  ? Lack of Transportation (Medical): No  ? Lack of Transportation (Non-Medical): No  ?Physical Activity: Inactive  ? Days of Exercise per Week: 0 days  ? Minutes of Exercise per Session: 0 min  ?Stress: No Stress Concern Present  ? Feeling of Stress : Only a little  ?Social Connections: Moderately Isolated  ? Frequency of Communication with Friends and Family: More than three times a week  ? Frequency of Social Gatherings with Friends and Family: More than three times a week  ? Attends Religious Services: Never  ? Active Member of Clubs or Organizations: No  ? Attends Archivist Meetings: Never  ? Marital Status: Married  ? ? ?Tobacco Counseling ?Ready to quit: Not Answered ?Counseling given: Not Answered ?Tobacco comments: 4 cigs daily trying to quit 01/27/2022 ? ? ?Clinical Intake: ? ?Pre-visit preparation completed: Yes ? ?Pain : 0-10 ?Pain Score: 5  ?Pain Type: Chronic pain ?Pain Location: Back ?Pain Descriptors / Indicators: Aching, Dull ?Pain Onset: More than a month ago ?Pain Frequency:  Intermittent ? ?  ? ?BMI - recorded: 29.62 ?Nutritional Status: BMI 25 -29 Overweight ?Nutritional Risks: None ?Diabetes: No ? ?How often do you need to have someone help you when you read instructions, pa

## 2022-03-04 NOTE — Telephone Encounter (Signed)
Pt asks if it is safe for her to take CoQ10 due to taking Plavix? Thank you.  ?

## 2022-03-05 ENCOUNTER — Ambulatory Visit: Payer: Medicare PPO | Attending: Pulmonary Disease

## 2022-03-05 DIAGNOSIS — R0602 Shortness of breath: Secondary | ICD-10-CM | POA: Diagnosis not present

## 2022-03-05 DIAGNOSIS — F17210 Nicotine dependence, cigarettes, uncomplicated: Secondary | ICD-10-CM | POA: Diagnosis not present

## 2022-03-05 LAB — PULMONARY FUNCTION TEST ARMC ONLY
DL/VA % pred: 57 %
DL/VA: 2.39 ml/min/mmHg/L
DLCO unc % pred: 56 %
DLCO unc: 11.65 ml/min/mmHg
FEF 25-75 Post: 0.98 L/sec
FEF 25-75 Pre: 0.82 L/sec
FEF2575-%Change-Post: 19 %
FEF2575-%Pred-Post: 43 %
FEF2575-%Pred-Pre: 36 %
FEV1-%Change-Post: 2 %
FEV1-%Pred-Post: 64 %
FEV1-%Pred-Pre: 62 %
FEV1-Post: 1.65 L
FEV1-Pre: 1.6 L
FEV1FVC-%Change-Post: -1 %
FEV1FVC-%Pred-Pre: 82 %
FEV6-%Change-Post: 4 %
FEV6-%Pred-Post: 80 %
FEV6-%Pred-Pre: 76 %
FEV6-Post: 2.58 L
FEV6-Pre: 2.46 L
FEV6FVC-%Change-Post: 0 %
FEV6FVC-%Pred-Post: 101 %
FEV6FVC-%Pred-Pre: 101 %
FVC-%Change-Post: 4 %
FVC-%Pred-Post: 78 %
FVC-%Pred-Pre: 75 %
FVC-Post: 2.64 L
FVC-Pre: 2.52 L
Post FEV1/FVC ratio: 63 %
Post FEV6/FVC ratio: 98 %
Pre FEV1/FVC ratio: 64 %
Pre FEV6/FVC Ratio: 98 %
RV % pred: 105 %
RV: 2.21 L
TLC % pred: 91 %
TLC: 4.74 L

## 2022-03-05 MED ORDER — ALBUTEROL SULFATE (2.5 MG/3ML) 0.083% IN NEBU
2.5000 mg | INHALATION_SOLUTION | Freq: Once | RESPIRATORY_TRACT | Status: AC
Start: 2022-03-05 — End: 2022-03-05
  Administered 2022-03-05: 2.5 mg via RESPIRATORY_TRACT
  Filled 2022-03-05: qty 3

## 2022-03-06 ENCOUNTER — Ambulatory Visit: Payer: Medicare PPO | Admitting: Adult Health

## 2022-03-09 DIAGNOSIS — J301 Allergic rhinitis due to pollen: Secondary | ICD-10-CM | POA: Diagnosis not present

## 2022-03-10 DIAGNOSIS — H524 Presbyopia: Secondary | ICD-10-CM | POA: Diagnosis not present

## 2022-03-11 ENCOUNTER — Telehealth: Payer: Self-pay

## 2022-03-11 NOTE — Telephone Encounter (Signed)
Patient is aware of results and voiced her understanding.  ?Pending appt 05/28/22. ?Nothing further needed.  ? ?

## 2022-03-11 NOTE — Telephone Encounter (Signed)
See results note.  She has moderate COPD.  Needs to quit smoking.  We can discuss alternative inhaler therapies on return visit. ?

## 2022-03-11 NOTE — Telephone Encounter (Signed)
Spoke to patient. ?She is requesting PFT results.  ? ?Dr. Patsey Berthold, please advise. Thanks ?

## 2022-03-16 DIAGNOSIS — J301 Allergic rhinitis due to pollen: Secondary | ICD-10-CM | POA: Diagnosis not present

## 2022-03-18 ENCOUNTER — Ambulatory Visit: Payer: Medicare PPO | Admitting: Dermatology

## 2022-03-18 DIAGNOSIS — Z85820 Personal history of malignant melanoma of skin: Secondary | ICD-10-CM

## 2022-03-18 DIAGNOSIS — L2089 Other atopic dermatitis: Secondary | ICD-10-CM | POA: Diagnosis not present

## 2022-03-18 DIAGNOSIS — L814 Other melanin hyperpigmentation: Secondary | ICD-10-CM | POA: Diagnosis not present

## 2022-03-18 DIAGNOSIS — L578 Other skin changes due to chronic exposure to nonionizing radiation: Secondary | ICD-10-CM

## 2022-03-18 DIAGNOSIS — D229 Melanocytic nevi, unspecified: Secondary | ICD-10-CM

## 2022-03-18 DIAGNOSIS — T148XXA Other injury of unspecified body region, initial encounter: Secondary | ICD-10-CM | POA: Diagnosis not present

## 2022-03-18 DIAGNOSIS — D1800 Hemangioma unspecified site: Secondary | ICD-10-CM

## 2022-03-18 DIAGNOSIS — L821 Other seborrheic keratosis: Secondary | ICD-10-CM

## 2022-03-18 DIAGNOSIS — Z1283 Encounter for screening for malignant neoplasm of skin: Secondary | ICD-10-CM | POA: Diagnosis not present

## 2022-03-18 DIAGNOSIS — L259 Unspecified contact dermatitis, unspecified cause: Secondary | ICD-10-CM | POA: Diagnosis not present

## 2022-03-18 MED ORDER — TRIAMCINOLONE ACETONIDE 0.1 % EX CREA: 1.0000 "application " | TOPICAL_CREAM | Freq: Two times a day (BID) | CUTANEOUS | 1 refills | Status: DC | PRN

## 2022-03-18 NOTE — Progress Notes (Signed)
Follow-Up Visit   Subjective  Heather Beasley is a 66 y.o. female who presents for the following: Annual Exam. Hx of Melanoma above the left knee in ~1995. The patient presents for Total-Body Skin Exam (TBSE) for skin cancer screening and mole check.  The patient has spots, moles and lesions to be evaluated, some may be new or changing and the patient has concerns that these could be cancer.   The following portions of the chart were reviewed this encounter and updated as appropriate:   Tobacco  Allergies  Meds  Problems  Med Hx  Surg Hx  Fam Hx     Review of Systems:  No other skin or systemic complaints except as noted in HPI or Assessment and Plan.  Objective  Well appearing patient in no apparent distress; mood and affect are within normal limits.  A full examination was performed including scalp, head, eyes, ears, nose, lips, neck, chest, axillae, abdomen, back, buttocks, bilateral upper extremities, bilateral lower extremities, hands, feet, fingers, toes, fingernails, and toenails. All findings within normal limits unless otherwise noted below.  Left Abdomen (side) - Upper Mild pink patch   Left Upper Back Excoriation    Assessment & Plan  Contact dermatitis, versus eczema/atopic dermatitis Left Abdomen (side) - Upper  Atopic dermatitis (eczema) is a chronic, relapsing, pruritic condition that can significantly affect quality of life. It is often associated with allergic rhinitis and/or asthma and can require treatment with topical medications, phototherapy, or in severe cases biologic injectable medication (Dupixent; Adbry) or Oral JAK inhibitors.   The rash is symptomatic and patient desires treatment.  We will treat with: Triamcinolone 0.1% cream twice a day as needed, Avoid applying to face, groin, and axilla. Use as directed. Long-term use can cause thinning of the skin.   Topical steroids (such as triamcinolone, fluocinolone, fluocinonide, mometasone,  clobetasol, halobetasol, betamethasone, hydrocortisone) can cause thinning and lightening of the skin if they are used for too long in the same area. Your physician has selected the right strength medicine for your problem and area affected on the body. Please use your medication only as directed by your physician to prevent side effects.    Related Medications triamcinolone cream (KENALOG) 0.1 % Apply 1 application. topically 2 (two) times daily as needed.  Excoriation Left Upper Back The patient will observe these symptoms, and report promptly any worsening or unexpected persistence.   If persistent after 6 weeks may consider biopsy.  Lentigines - Scattered tan macules - Due to sun exposure - Benign-appearing, observe - Recommend daily broad spectrum sunscreen SPF 30+ to sun-exposed areas, reapply every 2 hours as needed. - Call for any changes  Seborrheic Keratoses - Stuck-on, waxy, tan-brown papules and/or plaques  - Benign-appearing - Discussed benign etiology and prognosis. - Observe - Call for any changes  Melanocytic Nevi - Tan-brown and/or pink-flesh-colored symmetric macules and papules - Benign appearing on exam today - Observation - Call clinic for new or changing moles - Recommend daily use of broad spectrum spf 30+ sunscreen to sun-exposed areas.   Hemangiomas - Red papules - Discussed benign nature - Observe - Call for any changes  Actinic Damage - Chronic condition, secondary to cumulative UV/sun exposure - diffuse scaly erythematous macules with underlying dyspigmentation - Recommend daily broad spectrum sunscreen SPF 30+ to sun-exposed areas, reapply every 2 hours as needed.  - Staying in the shade or wearing long sleeves, sun glasses (UVA+UVB protection) and wide brim hats (4-inch brim around the entire  circumference of the hat) are also recommended for sun protection.  - Call for new or changing lesions.  History of Melanoma Above left knee ~1995 -  No evidence of recurrence today - No lymphadenopathy - Recommend regular full body skin exams - Recommend daily broad spectrum sunscreen SPF 30+ to sun-exposed areas, reapply every 2 hours as needed.  - Call if any new or changing lesions are noted between office visits   Skin cancer screening performed today.   Return in about 1 year (around 03/19/2023) for TBSE, hx of Melanoma.  IMarye Round, CMA, am acting as scribe for Sarina Ser, MD .  Documentation: I have reviewed the above documentation for accuracy and completeness, and I agree with the above.  Sarina Ser, MD

## 2022-03-18 NOTE — Patient Instructions (Addendum)

## 2022-03-23 ENCOUNTER — Encounter: Payer: Self-pay | Admitting: Dermatology

## 2022-03-30 DIAGNOSIS — J301 Allergic rhinitis due to pollen: Secondary | ICD-10-CM | POA: Diagnosis not present

## 2022-03-31 ENCOUNTER — Ambulatory Visit (INDEPENDENT_AMBULATORY_CARE_PROVIDER_SITE_OTHER): Payer: Medicare PPO

## 2022-03-31 ENCOUNTER — Telehealth: Payer: Self-pay

## 2022-03-31 DIAGNOSIS — E538 Deficiency of other specified B group vitamins: Secondary | ICD-10-CM | POA: Diagnosis not present

## 2022-03-31 MED ORDER — CYANOCOBALAMIN 1000 MCG/ML IJ SOLN
1000.0000 ug | Freq: Once | INTRAMUSCULAR | Status: AC
  Administered 2022-03-31: 1000 ug via INTRAMUSCULAR

## 2022-03-31 NOTE — Telephone Encounter (Signed)
Patient received her B12 monthly injection 3 of 3. Patient wanted to know when she should start taking OTC vitamin B12? Also, wanted to verify dose of Vitamin D OTC she should be taking?

## 2022-03-31 NOTE — Progress Notes (Signed)
Per orders of Eugenia Pancoast, FNP injection of B12 monthly 3 of 3 monthly injections given by Kris Mouton. Patient tolerated injection well.

## 2022-04-01 NOTE — Telephone Encounter (Signed)
Called and informed pt about the B-12 and D-3 to take daily

## 2022-04-03 DIAGNOSIS — H35371 Puckering of macula, right eye: Secondary | ICD-10-CM | POA: Diagnosis not present

## 2022-04-03 DIAGNOSIS — H25043 Posterior subcapsular polar age-related cataract, bilateral: Secondary | ICD-10-CM | POA: Diagnosis not present

## 2022-04-03 DIAGNOSIS — H18413 Arcus senilis, bilateral: Secondary | ICD-10-CM | POA: Diagnosis not present

## 2022-04-03 DIAGNOSIS — H2512 Age-related nuclear cataract, left eye: Secondary | ICD-10-CM | POA: Diagnosis not present

## 2022-04-03 DIAGNOSIS — H2513 Age-related nuclear cataract, bilateral: Secondary | ICD-10-CM | POA: Diagnosis not present

## 2022-04-03 DIAGNOSIS — H25013 Cortical age-related cataract, bilateral: Secondary | ICD-10-CM | POA: Diagnosis not present

## 2022-04-06 DIAGNOSIS — J301 Allergic rhinitis due to pollen: Secondary | ICD-10-CM | POA: Diagnosis not present

## 2022-04-10 ENCOUNTER — Ambulatory Visit: Payer: Medicare PPO | Admitting: Family

## 2022-04-13 DIAGNOSIS — J301 Allergic rhinitis due to pollen: Secondary | ICD-10-CM | POA: Diagnosis not present

## 2022-04-14 ENCOUNTER — Encounter: Payer: Self-pay | Admitting: Family

## 2022-04-14 ENCOUNTER — Ambulatory Visit (INDEPENDENT_AMBULATORY_CARE_PROVIDER_SITE_OTHER): Payer: Medicare PPO | Admitting: Family

## 2022-04-14 ENCOUNTER — Ambulatory Visit: Payer: Medicare PPO | Admitting: Family

## 2022-04-14 VITALS — BP 118/70 | HR 108 | Temp 98.3°F | Resp 16 | Ht 65.0 in | Wt 176.1 lb

## 2022-04-14 DIAGNOSIS — I10 Essential (primary) hypertension: Secondary | ICD-10-CM | POA: Diagnosis not present

## 2022-04-14 DIAGNOSIS — E559 Vitamin D deficiency, unspecified: Secondary | ICD-10-CM

## 2022-04-14 DIAGNOSIS — R5383 Other fatigue: Secondary | ICD-10-CM | POA: Diagnosis not present

## 2022-04-14 DIAGNOSIS — J301 Allergic rhinitis due to pollen: Secondary | ICD-10-CM

## 2022-04-14 DIAGNOSIS — E538 Deficiency of other specified B group vitamins: Secondary | ICD-10-CM

## 2022-04-14 DIAGNOSIS — J439 Emphysema, unspecified: Secondary | ICD-10-CM

## 2022-04-14 MED ORDER — MONTELUKAST SODIUM 10 MG PO TABS: 10.0000 mg | ORAL_TABLET | Freq: Every day | ORAL | 1 refills | Status: DC

## 2022-04-14 MED ORDER — LOSARTAN POTASSIUM 50 MG PO TABS: 50.0000 mg | ORAL_TABLET | Freq: Every day | ORAL | 1 refills | Status: DC

## 2022-04-14 NOTE — Assessment & Plan Note (Signed)
Repeat b12, tsh, and vitamin d pending results

## 2022-04-14 NOTE — Assessment & Plan Note (Signed)
continue zyrtec daily as well as start flonase 50 mcg daily.

## 2022-04-14 NOTE — Patient Instructions (Signed)
Continue with daily vitamin B12 1000 mcg once daily Continue vitamin D3 2000 IU once daily.   Due to recent changes in healthcare laws, you may see results of your imaging and/or laboratory studies on MyChart before I have had a chance to review them.  I understand that in some cases there may be results that are confusing or concerning to you. Please understand that not all results are received at the same time and often I may need to interpret multiple results in order to provide you with the best plan of care or course of treatment. Therefore, I ask that you please give me 2 business days to thoroughly review all your results before contacting my office for clarification. Should we see a critical lab result, you will be contacted sooner.   It was a pleasure seeing you today! Please do not hesitate to reach out with any questions and or concerns.  Regards,   Eugenia Pancoast FNP-C

## 2022-04-14 NOTE — Progress Notes (Signed)
Established Patient Office Visit  Subjective:  Patient ID: Heather Beasley, female    DOB: 1956-06-06  Age: 66 y.o. MRN: [id number]  CC:  Chief Complaint  Patient presents with   Hypertension    HPI Heather Beasley is here today for follow up.   Pt is with acute concerns.  Dyspnea: seeing cardiologist and pulmonary. Pending sleep study. Cardiologist last visit, Dr. Angelena Form started pt on lasix 20 mg prn, advised to f/u with cards one year. Lasix has helped her, she takes about three days a week. No longer with rle edema.   Copd, moderate: advised to stop smoking per pulmonary. Continue advir per pulm. Nodule of left lung lower lobe- 6-8 mm , yearly LDCT. Pt with f/u 05/28/22  Sleep study scheduled for next week  Vit d def: Last vitamin D Lab Results  Component Value Date   VD25OH 24.41 (L) 11/17/2021   B12 def: did get all three b12 injections, just started b12 1000 mcg once daily just this am.   HTN: losartan 25 mg, taking only half of 50 mg tablet. She is tolerating well. She was having dizzy spells which had her split her blood pressure med in half. Average 110/125 over 70/80.   Fyi: July 12, left cataract removed. August 4th, cataract right eye removal.  Dr. Tommy Rainwater in [place].   Wt Readings from Last 3 Encounters:  04/14/22 176 lb 2 oz (79.9 kg)  03/04/22 178 lb (80.7 kg)  02/27/22 178 lb 6.4 oz (80.9 kg)     Past Medical History:  Diagnosis Date   Allergic rhinitis    Arthritis    Asthma    Cancer of nasal cavity and sinus (HCC) 07/07/2015   radiation   Cervical spondylosis    Colitis    Colon polyps    Depression    Diverticulosis    GERD (gastroesophageal reflux disease)    Glaucoma    Headaches, cluster    HTN (hypertension)    Hyperlipidemia    IBS (irritable bowel syndrome)    Kidney stones    Melanoma ([place])    above the L knee    Neuropathy    PONV (postoperative nausea and vomiting)    Tobacco abuse     Past Surgical History:   Procedure Laterality Date   [place]     CAROTID PTA/STENT INTERVENTION Left 06/27/2020   Procedure: CAROTID PTA/STENT INTERVENTION;  Surgeon: Algernon Huxley, MD;  Location: [facility];  Service: Cardiovascular;  Laterality: Left;   CERVICAL FUSION  2010   CHOLECYSTECTOMY  08/2011   Dr Rochel Brome   COLONOSCOPY     Dr Tiffany Kocher   CYSTOSCOPY     Dr. Bernardo Heater   DILATION AND CURETTAGE OF UTERUS     ESOPHAGOGASTRODUODENOSCOPY  2012   Dr Tiffany Kocher   FRACTURE SURGERY  2001   plate in right wrist and arm   IMAGE GUIDED SINUS SURGERY N/A 06/25/2015   Procedure: IMAGE GUIDED SINUS SURGERY;  Surgeon: Beverly Gust, MD;  Location: [facility];  Service: ENT;  Laterality: N/A;   LUMBAR SPINE SURGERY  07/2004   OOPHORECTOMY     POLYPECTOMY N/A 06/25/2015   Procedure: POLYPECTOMY NASAL;  Surgeon: Beverly Gust, MD;  Location: [facility];  Service: ENT;  Laterality: N/A;   [place]     x2  Family History  Problem Relation Age of Onset   Hypertension Mother    Hyperlipidemia Mother    Diabetes Mother    Colon cancer Father    Pancreatic cancer Father    Cancer Father        Colon & Pancreatic   Breast cancer Paternal Grandmother 58   Diabetes Paternal Grandmother    Coronary artery disease Paternal Grandmother    Heart failure Paternal Grandmother    Cancer Paternal Grandmother        breast   Stroke Brother     Social History   Socioeconomic History   Marital status: Married    Spouse name: Not on file   Number of children: 2   Years of education: Not on file   Highest education level: Not on file  Occupational History   Occupation: Scientist, water quality   Occupation: disabled  Tobacco Use   Smoking status: Every Day    Packs/day: 1.00    Years: 39.00    Total pack years: 39.00    Types: Cigarettes   Smokeless tobacco: Never   Tobacco comments:    4 cigs daily trying to  quit 01/27/2022  Substance and Sexual Activity   Alcohol use: No   Drug use: No   Sexual activity: Not Currently    Partners: Male  Other Topics Concern   Not on file  Social History Narrative   Lives in [place] with husband. Dog in home. Work - disabled for neck and back pain.   Social Determinants of Health   Financial Resource Strain: Low Risk  (03/04/2022)   Overall Financial Resource Strain (CARDIA)    Difficulty of Paying Living Expenses: Not hard at all  Food Insecurity: No Food Insecurity (03/04/2022)   Hunger Vital Sign    Worried About Running Out of Food in the Last Year: Never true    Ran Out of Food in the Last Year: Never true  Transportation Needs: No Transportation Needs (03/04/2022)   PRAPARE - Hydrologist (Medical): No    Lack of Transportation (Non-Medical): No  Physical Activity: Inactive (03/04/2022)   Exercise Vital Sign    Days of Exercise per Week: 0 days    Minutes of Exercise per Session: 0 min  Stress: No Stress Concern Present (03/04/2022)   [place]    Feeling of Stress : Only a little  Social Connections: Moderately Isolated (03/04/2022)   Social Connection and Isolation Panel [NHANES]    Frequency of Communication with Friends and Family: More than three times a week    Frequency of Social Gatherings with Friends and Family: More than three times a week    Attends Religious Services: Never    Marine scientist or Organizations: No    Attends Archivist Meetings: Never    Marital Status: Married  Human resources officer Violence: Not At Risk (03/04/2022)   Humiliation, Afraid, Rape, and Kick questionnaire    Fear of Current or Ex-Partner: No    Emotionally Abused: No    Physically Abused: No    Sexually Abused: No    Outpatient Medications Prior to Visit  Medication Sig Dispense Refill   albuterol (VENTOLIN HFA) 108 (90 Base) MCG/ACT  inhaler Inhale into the lungs every 6 (six) hours as needed.     atorvastatin (LIPITOR) 20 MG tablet Take 1 tablet (20 mg total) by mouth daily. 90 tablet 1   Azelastine HCl 137  MCG/SPRAY SOLN Place into both nostrils.     cetirizine (ZYRTEC) 10 MG tablet Take 10 mg by mouth daily. Allergies.     Cholecalciferol (VITAMIN D) 50 MCG (2000 UT) CAPS Take 2,000 Units by mouth.     clopidogrel (PLAVIX) 75 MG tablet TAKE 1 TABLET BY MOUTH ONCE A DAY 90 tablet 3   cyclobenzaprine (FLEXERIL) 10 MG tablet Take 1 tablet (10 mg total) by mouth 3 (three) times daily as needed for muscle spasms. 30 tablet 0   EPINEPHrine 0.3 mg/0.3 mL IJ SOAJ injection Inject 1 mg into the skin as needed.     famotidine (PEPCID) 40 MG tablet Take 1 tablet by mouth at bedtime.     fluticasone-salmeterol (ADVAIR DISKUS) 250-50 MCG/ACT AEPB Inhale 1 puff into the lungs in the morning and at bedtime. 1 each 1   furosemide (LASIX) 20 MG tablet Take 1 tablet (20 mg total) by mouth daily. 90 tablet 2   loperamide (IMODIUM A-D) 2 MG tablet Take 2 mg by mouth 2 (two) times a day.     Multiple Vitamins-Minerals (CENTRUM ADULTS PO) Take 1 tablet by mouth.     pantoprazole (PROTONIX) 40 MG tablet Take 1 tablet (40 mg total) by mouth 2 (two) times daily. 60 tablet 11   triamcinolone cream (KENALOG) 0.1 % Apply 1 application. topically 2 (two) times daily as needed. 15 g 1   VENTOLIN HFA 108 (90 Base) MCG/ACT inhaler INHALE 2 PUFFS INTO THE LUNGS EVERY 6 HOURS AS NEEDED FOR WHEEZING ORSHORTNESS OF BREATH 18 g 0   vitamin B-12 (CYANOCOBALAMIN) 1000 MCG tablet Take 1,000 mcg by mouth daily.     losartan (COZAAR) 100 MG tablet Take 1 tablet (100 mg total) by mouth daily. (Patient taking differently: Take 50 mg by mouth daily.) 90 tablet 1   montelukast (SINGULAIR) 10 MG tablet Take 1 tablet (10 mg total) by mouth at bedtime. 30 tablet 3   No facility-administered medications prior to visit.    Allergies  Allergen Reactions   Albumin  (Human) Rash   Amoxicillin-Pot Clavulanate Hives   Gabapentin Shortness Of Breath   Latex Dermatitis and Rash   Betadine [Povidone Iodine] Dermatitis, Rash and Other (See Comments)    Skin burning Topical betadine and iodine have this reaction with patient.  IV contrast is NOT a problem.  jkl   Sucralfate     Other reaction(s): Other (See Comments) Abdominal Pain   Amoxicillin     REACTION: rash   Chantix [Varenicline Tartrate]     Heart racing   Erythromycin     REACTION: rash   Sucralfate Nausea And Vomiting    Heart racing Lightheaded       Review of Systems  Respiratory:  Negative for shortness of breath.   Cardiovascular:  Negative for chest pain and palpitations.  Gastrointestinal:  Negative for constipation and diarrhea.  Genitourinary:  Negative for dysuria, frequency and urgency.  Musculoskeletal:  Negative for myalgias.  Psychiatric/Behavioral:  Negative for depression and suicidal ideas.   All other systems reviewed and are negative.    Objective:    Physical Exam Vitals reviewed.  Constitutional:      General: She is not in acute distress.    Appearance: Normal appearance. She is obese. She is not ill-appearing, toxic-appearing or diaphoretic.  HENT:     Right Ear: Tympanic membrane normal.     Left Ear: Tympanic membrane normal.     Mouth/Throat:     Mouth: Mucous membranes are  moist.     Pharynx: Posterior oropharyngeal erythema present. No pharyngeal swelling.     Tonsils: No tonsillar exudate.  Eyes:     Extraocular Movements: Extraocular movements intact.     Conjunctiva/sclera: Conjunctivae normal.     Pupils: Pupils are equal, round, and reactive to light.  Neck:     Thyroid: No thyroid mass.  Cardiovascular:     Rate and Rhythm: Normal rate and regular rhythm.  Pulmonary:     Effort: Pulmonary effort is normal.     Breath sounds: Normal breath sounds.  Abdominal:     General: Abdomen is flat. Bowel sounds are normal.     Palpations:  Abdomen is soft.  Lymphadenopathy:     Cervical:     Right cervical: No superficial cervical adenopathy.    Left cervical: No superficial cervical adenopathy.  Skin:    General: Skin is warm.     Capillary Refill: Capillary refill takes less than 2 seconds.  Neurological:     General: No focal deficit present.     Mental Status: She is alert and oriented to person, place, and time.  Psychiatric:        Mood and Affect: Mood normal.        Behavior: Behavior normal.        Thought Content: Thought content normal.        Judgment: Judgment normal.      BP 118/70   Pulse (!) 108   Temp 98.3 F (36.8 C)   Resp 16   Ht '5\' 5"'$  (1.651 m)   Wt 176 lb 2 oz (79.9 kg)   SpO2 94%   BMI 29.31 kg/m  Wt Readings from Last 3 Encounters:  04/14/22 176 lb 2 oz (79.9 kg)  03/04/22 178 lb (80.7 kg)  02/27/22 178 lb 6.4 oz (80.9 kg)     Health Maintenance Due  Topic Date Due   COVID-19 Vaccine (1) Never done    There are no preventive care reminders to display for this patient.  Lab Results  Component Value Date   TSH 4.02 01/08/2022   Lab Results  Component Value Date   WBC 5.7 12/31/2021   HGB 13.0 12/31/2021   HCT 42.6 12/31/2021   MCV 99.8 12/31/2021   PLT 221 12/31/2021   Lab Results  Component Value Date   NA 142 12/31/2021   K 3.9 12/31/2021   CO2 27 12/31/2021   GLUCOSE 110 (H) 12/31/2021   BUN 13 12/31/2021   CREATININE 0.96 12/31/2021   BILITOT 1.0 12/31/2021   ALKPHOS 100 12/31/2021   AST 22 12/31/2021   ALT 14 12/31/2021   PROT 7.0 12/31/2021   ALBUMIN 3.6 12/31/2021   CALCIUM 9.1 12/31/2021   ANIONGAP 7 12/31/2021   GFR 59.99 (L) 11/17/2021   Lab Results  Component Value Date   CHOL 138 11/17/2021   Lab Results  Component Value Date   HDL 52.50 11/17/2021   Lab Results  Component Value Date   LDLCALC 66 11/17/2021   Lab Results  Component Value Date   TRIG 97.0 11/17/2021   Lab Results  Component Value Date   CHOLHDL 3 11/17/2021    No results found for: "HGBA1C"    Assessment & Plan:   Problem List Items Addressed This Visit       Cardiovascular and Mediastinum   Essential hypertension, benign - Primary    Continue 50 mg losartan  Pt advised of the following:  Continue medication as prescribed.  Monitor blood pressure periodically and/or when you feel symptomatic. Goal is <130/90 on average. Ensure that you have rested for 30 minutes prior to checking your blood pressure. Record your readings and bring them to your next visit if necessary.work on a low sodium diet.       Relevant Medications   losartan (COZAAR) 50 MG tablet   Other Relevant Orders   Comprehensive metabolic panel     Respiratory   Allergic rhinitis    continue zyrtec daily as well as start flonase 50 mcg daily.       Pulmonary emphysema ([place])    Continue f/u with pulmonologist       Relevant Medications   montelukast (SINGULAIR) 10 MG tablet     Other   Vitamin D deficiency    Continue vitamin D 2000 IU once daily.       Relevant Orders   VITAMIN D 25 Hydroxy (Vit-D Deficiency, Fractures)   Other fatigue    Repeat b12, tsh, and vitamin d pending results       Relevant Orders   B12 and Folate Panel   TSH   Vitamin B12 deficiency    Completed b12 injections Continue otc b12 1000 mcg once daily Check b12 today      Relevant Orders   B12 and Folate Panel    Meds ordered this encounter  Medications   losartan (COZAAR) 50 MG tablet    Sig: Take 1 tablet (50 mg total) by mouth daily.    Dispense:  90 tablet    Refill:  1    Order Specific Question:   Supervising Provider    Answer:   BEDSOLE, AMY E [[id number]]   montelukast (SINGULAIR) 10 MG tablet    Sig: Take 1 tablet (10 mg total) by mouth at bedtime.    Dispense:  90 tablet    Refill:  1    Order Specific Question:   Supervising Provider    Answer:   Diona Browner, AMY E [[id number]]    Follow-up: Return in about 6 months (around 10/14/2022) for regular follow up appt.     Eugenia Pancoast, FNP

## 2022-04-14 NOTE — Assessment & Plan Note (Signed)
Continue 50 mg losartan  Pt advised of the following:  Continue medication as prescribed. Monitor blood pressure periodically and/or when you feel symptomatic. Goal is <130/90 on average. Ensure that you have rested for 30 minutes prior to checking your blood pressure. Record your readings and bring them to your next visit if necessary.work on a low sodium diet.

## 2022-04-14 NOTE — Assessment & Plan Note (Signed)
Completed b12 injections Continue otc b12 1000 mcg once daily Check b12 today

## 2022-04-14 NOTE — Assessment & Plan Note (Signed)
Continue vitamin D 2000 IU once daily.

## 2022-04-14 NOTE — Assessment & Plan Note (Signed)
Continue f/u with pulmonologist

## 2022-04-15 LAB — COMPREHENSIVE METABOLIC PANEL
ALT: 11 U/L (ref 0–35)
AST: 17 U/L (ref 0–37)
Albumin: 4.1 g/dL (ref 3.5–5.2)
Alkaline Phosphatase: 94 U/L (ref 39–117)
BUN: 19 mg/dL (ref 6–23)
CO2: 30 mEq/L (ref 19–32)
Calcium: 9.2 mg/dL (ref 8.4–10.5)
Chloride: 106 mEq/L (ref 96–112)
Creatinine, Ser: 1.08 mg/dL (ref 0.40–1.20)
GFR: 54.54 mL/min — ABNORMAL LOW (ref >60.00)
Glucose, Bld: 85 mg/dL (ref 70–99)
Potassium: 4.2 mEq/L (ref 3.5–5.1)
Sodium: 143 mEq/L (ref 135–145)
Total Bilirubin: 0.8 mg/dL (ref 0.2–1.2)
Total Protein: 6.4 g/dL (ref 6.0–8.3)

## 2022-04-15 LAB — VITAMIN D 25 HYDROXY (VIT D DEFICIENCY, FRACTURES): VITD: 40.45 ng/mL (ref 30.00–100.00)

## 2022-04-15 LAB — B12 AND FOLATE PANEL
Folate: 6.3 ng/mL (ref >5.9)
Vitamin B-12: 451 pg/mL (ref 211–911)

## 2022-04-15 LAB — TSH: TSH: 3.66 u[IU]/mL (ref 0.35–5.50)

## 2022-04-15 NOTE — Progress Notes (Signed)
Kidneys are stable  B12 still on lower end but some improvement, continue vitamin b12 1000 mcg once daily Thyroid ok.  Vitamin d improved. Continue daily 2000 IU vitamin D3

## 2022-04-20 DIAGNOSIS — J301 Allergic rhinitis due to pollen: Secondary | ICD-10-CM | POA: Diagnosis not present

## 2022-04-21 ENCOUNTER — Ambulatory Visit: Payer: Medicare PPO

## 2022-04-24 ENCOUNTER — Ambulatory Visit: Payer: Medicare PPO

## 2022-04-24 ENCOUNTER — Ambulatory Visit: Payer: Medicare PPO | Attending: Pulmonary Disease

## 2022-04-24 DIAGNOSIS — J438 Other emphysema: Secondary | ICD-10-CM | POA: Diagnosis not present

## 2022-04-24 DIAGNOSIS — R0683 Snoring: Secondary | ICD-10-CM | POA: Diagnosis not present

## 2022-04-24 DIAGNOSIS — R400 Somnolence: Secondary | ICD-10-CM | POA: Insufficient documentation

## 2022-04-24 DIAGNOSIS — J301 Allergic rhinitis due to pollen: Secondary | ICD-10-CM | POA: Diagnosis not present

## 2022-05-04 DIAGNOSIS — J301 Allergic rhinitis due to pollen: Secondary | ICD-10-CM | POA: Diagnosis not present

## 2022-05-06 DIAGNOSIS — H2512 Age-related nuclear cataract, left eye: Secondary | ICD-10-CM | POA: Diagnosis not present

## 2022-05-07 DIAGNOSIS — H2511 Age-related nuclear cataract, right eye: Secondary | ICD-10-CM | POA: Diagnosis not present

## 2022-05-11 DIAGNOSIS — J301 Allergic rhinitis due to pollen: Secondary | ICD-10-CM | POA: Diagnosis not present

## 2022-05-12 ENCOUNTER — Telehealth (HOSPITAL_BASED_OUTPATIENT_CLINIC_OR_DEPARTMENT_OTHER): Payer: Medicare PPO | Admitting: Pulmonary Disease

## 2022-05-12 DIAGNOSIS — G4710 Hypersomnia, unspecified: Secondary | ICD-10-CM

## 2022-05-12 NOTE — Telephone Encounter (Signed)
Dr. Elsworth Soho, please advise. There is no interpretation within this encounter.

## 2022-05-12 NOTE — Telephone Encounter (Signed)
No significant OSA on her study

## 2022-05-12 NOTE — Telephone Encounter (Signed)
Will route to Dr. Patsey Berthold to advise.

## 2022-05-15 ENCOUNTER — Other Ambulatory Visit (INDEPENDENT_AMBULATORY_CARE_PROVIDER_SITE_OTHER): Payer: Self-pay | Admitting: Nurse Practitioner

## 2022-05-15 DIAGNOSIS — I6523 Occlusion and stenosis of bilateral carotid arteries: Secondary | ICD-10-CM

## 2022-05-18 DIAGNOSIS — J301 Allergic rhinitis due to pollen: Secondary | ICD-10-CM | POA: Diagnosis not present

## 2022-05-18 NOTE — Telephone Encounter (Signed)
Spoke to [place] with [facility] and requested results to be faxed to our office.  Fax machine is currently down at [place] office.  Will leave encounter open to ensure f/u.

## 2022-05-18 NOTE — Telephone Encounter (Signed)
I really would like to see official report.

## 2022-05-18 NOTE — Telephone Encounter (Signed)
Lm for [facility] to request sleep study.

## 2022-05-19 ENCOUNTER — Ambulatory Visit (INDEPENDENT_AMBULATORY_CARE_PROVIDER_SITE_OTHER): Payer: Medicare PPO

## 2022-05-19 ENCOUNTER — Encounter (INDEPENDENT_AMBULATORY_CARE_PROVIDER_SITE_OTHER): Payer: Self-pay | Admitting: Vascular Surgery

## 2022-05-19 ENCOUNTER — Ambulatory Visit (INDEPENDENT_AMBULATORY_CARE_PROVIDER_SITE_OTHER): Payer: Medicare PPO | Admitting: Vascular Surgery

## 2022-05-19 VITALS — BP 142/83 | HR 80 | Resp 16 | Ht 65.0 in | Wt 177.0 lb

## 2022-05-19 DIAGNOSIS — I10 Essential (primary) hypertension: Secondary | ICD-10-CM

## 2022-05-19 DIAGNOSIS — M79604 Pain in right leg: Secondary | ICD-10-CM

## 2022-05-19 DIAGNOSIS — I6522 Occlusion and stenosis of left carotid artery: Secondary | ICD-10-CM | POA: Diagnosis not present

## 2022-05-19 DIAGNOSIS — E7800 Pure hypercholesterolemia, unspecified: Secondary | ICD-10-CM | POA: Diagnosis not present

## 2022-05-19 DIAGNOSIS — I6523 Occlusion and stenosis of bilateral carotid arteries: Secondary | ICD-10-CM

## 2022-05-19 DIAGNOSIS — M79609 Pain in unspecified limb: Secondary | ICD-10-CM | POA: Insufficient documentation

## 2022-05-19 DIAGNOSIS — M79605 Pain in left leg: Secondary | ICD-10-CM

## 2022-05-19 NOTE — Assessment & Plan Note (Addendum)
Recommend:  The patient has atypical pain symptoms for pure atherosclerotic disease. However, on physical exam there is evidence of vascular disease, given the diminished pulses and the edema associated with venous changes of the legs.  Further investigation of the patient's vascular disease is necessary to determine the relationship of the patient's lower extremity symptoms and the degree of vascular disease.  Noninvasive studies of the will be obtained and the patient will follow up with me to review these studies.  I suspect the patient is c/o pseudoclaudication. There is likely a neuropathic component of the pain.  Patient should have an evaluation of his LS spine which I defer to either the primary service or the Spine service.  The patient should continue walking and begin a more formal exercise program. The patient should continue his antiplatelet therapy and aggressive treatment of the lipid abnormalities.

## 2022-05-19 NOTE — Assessment & Plan Note (Signed)
Duplex today shows mild right carotid stenosis in the 1 to 39% range and a widely patent left carotid stent.  Patient is doing well.  Continue current medical regimen.  Recheck in 1 year with duplex.

## 2022-05-19 NOTE — Progress Notes (Signed)
MRN : [id number]  Heather Beasley is a 66 y.o. (10-Jan-1956) female who presents with chief complaint of  Chief Complaint  Patient presents with   Follow-up    ultrasound  .  History of Present Illness: Patient returns in follow-up predominantly for her carotid disease.  She is doing well from a cerebrovascular standpoint.  No focal neurologic symptoms. Specifically, the patient denies amaurosis fugax, speech or swallowing difficulties, or arm or leg weakness or numbness. Duplex today shows mild right carotid stenosis in the 1 to 39% range and a widely patent left carotid stent. The patient is currently having another issue.  She is having more pain as well as swelling and prominent varicosities particularly in the left lower extremity.  She has no history of DVT or superficial thrombophlebitis to her knowledge.  No open wounds.  No fevers or chills.  No infections.  Current Outpatient Medications  Medication Sig Dispense Refill   albuterol (VENTOLIN HFA) 108 (90 Base) MCG/ACT inhaler Inhale into the lungs every 6 (six) hours as needed.     Azelastine HCl 137 MCG/SPRAY SOLN Place into both nostrils.     cetirizine (ZYRTEC) 10 MG tablet Take 10 mg by mouth daily. Allergies.     Cholecalciferol (VITAMIN D) 50 MCG (2000 UT) CAPS Take 2,000 Units by mouth.     clopidogrel (PLAVIX) 75 MG tablet TAKE 1 TABLET BY MOUTH ONCE A DAY 90 tablet 3   cyclobenzaprine (FLEXERIL) 10 MG tablet Take 1 tablet (10 mg total) by mouth 3 (three) times daily as needed for muscle spasms. 30 tablet 0   Dextromethorphan-guaiFENesin (CORICIDIN HBP CONGESTION/COUGH) 10-200 MG CAPS Take by mouth.     DUREZOL 0.05 % EMUL Apply to eye.     EPINEPHrine 0.3 mg/0.3 mL IJ SOAJ injection Inject 1 mg into the skin as needed.     famotidine (PEPCID) 40 MG tablet Take 1 tablet by mouth at bedtime.     fluticasone-salmeterol (ADVAIR DISKUS) 250-50 MCG/ACT AEPB Inhale 1 puff into the lungs in the morning and at bedtime. 1 each 1    furosemide (LASIX) 20 MG tablet Take 1 tablet (20 mg total) by mouth daily. 90 tablet 2   ketorolac (ACULAR) 0.5 % ophthalmic solution SMARTSIG:In Eye(s)     loperamide (IMODIUM A-D) 2 MG tablet Take 2 mg by mouth 2 (two) times a day.     losartan (COZAAR) 50 MG tablet Take 1 tablet (50 mg total) by mouth daily. 90 tablet 1   montelukast (SINGULAIR) 10 MG tablet Take 1 tablet (10 mg total) by mouth at bedtime. 90 tablet 1   moxifloxacin (VIGAMOX) 0.5 % ophthalmic solution Apply to eye.     Multiple Vitamins-Minerals (CENTRUM ADULTS PO) Take 1 tablet by mouth.     pantoprazole (PROTONIX) 40 MG tablet Take 1 tablet (40 mg total) by mouth 2 (two) times daily. 60 tablet 11   triamcinolone cream (KENALOG) 0.1 % Apply 1 application. topically 2 (two) times daily as needed. 15 g 1   VENTOLIN HFA 108 (90 Base) MCG/ACT inhaler INHALE 2 PUFFS INTO THE LUNGS EVERY 6 HOURS AS NEEDED FOR WHEEZING ORSHORTNESS OF BREATH 18 g 0   vitamin B-12 (CYANOCOBALAMIN) 1000 MCG tablet Take 1,000 mcg by mouth daily.     atorvastatin (LIPITOR) 20 MG tablet Take 1 tablet (20 mg total) by mouth daily. 90 tablet 1   No current facility-administered medications for this visit.    Past Medical History:  Diagnosis Date  Allergic rhinitis    Arthritis    Asthma    Cancer of nasal cavity and sinus ([place]) 07/07/2015   radiation   Cervical spondylosis    Colitis    Colon polyps    Depression    Diverticulosis    GERD (gastroesophageal reflux disease)    Glaucoma    Headaches, cluster    HTN (hypertension)    Hyperlipidemia    IBS (irritable bowel syndrome)    Kidney stones    Melanoma ([place])    above the L knee    Neuropathy    PONV (postoperative nausea and vomiting)    Tobacco abuse     Past Surgical History:  Procedure Laterality Date   [place]     CAROTID PTA/STENT INTERVENTION Left 06/27/2020   Procedure: CAROTID PTA/STENT  INTERVENTION;  Surgeon: Algernon Huxley, MD;  Location: [facility];  Service: Cardiovascular;  Laterality: Left;   CERVICAL FUSION  2010   CHOLECYSTECTOMY  08/2011   Dr Rochel Brome   COLONOSCOPY     Dr Tiffany Kocher   CYSTOSCOPY     Dr. Bernardo Heater   DILATION AND CURETTAGE OF UTERUS     ESOPHAGOGASTRODUODENOSCOPY  2012   Dr Tiffany Kocher   FRACTURE SURGERY  2001   plate in right wrist and arm   IMAGE GUIDED SINUS SURGERY N/A 06/25/2015   Procedure: IMAGE GUIDED SINUS SURGERY;  Surgeon: Beverly Gust, MD;  Location: [facility];  Service: ENT;  Laterality: N/A;   LUMBAR SPINE SURGERY  07/2004   OOPHORECTOMY     POLYPECTOMY N/A 06/25/2015   Procedure: POLYPECTOMY NASAL;  Surgeon: Beverly Gust, MD;  Location: [facility];  Service: ENT;  Laterality: N/A;   [place]     x2     Social History   Tobacco Use   Smoking status: Every Day    Packs/day: 1.00    Years: 39.00    Total pack years: 39.00    Types: Cigarettes   Smokeless tobacco: Never   Tobacco comments:    4 cigs daily trying to quit 01/27/2022  Substance Use Topics   Alcohol use: No   Drug use: No      Family History  Problem Relation Age of Onset   Hypertension Mother    Hyperlipidemia Mother    Diabetes Mother    Colon cancer Father    Pancreatic cancer Father    Cancer Father        Colon & Pancreatic   Breast cancer Paternal Grandmother 65   Diabetes Paternal Grandmother    Coronary artery disease Paternal Grandmother    Heart failure Paternal Grandmother    Cancer Paternal Grandmother        breast   Stroke Brother      Allergies  Allergen Reactions   Albumin (Human) Rash   Amoxicillin-Pot Clavulanate Hives   Gabapentin Shortness Of Breath   Latex Dermatitis and Rash   Betadine [Povidone Iodine] Dermatitis, Rash and Other (See Comments)    Skin burning Topical betadine and iodine have this reaction with patient.  IV contrast is NOT a problem.  jkl   Sucralfate     Other  reaction(s): Other (See Comments) Abdominal Pain   Amoxicillin     REACTION: rash   Chantix [Varenicline Tartrate]     Heart racing   Erythromycin  REACTION: rash   Sucralfate Nausea And Vomiting    Heart racing Lightheaded      REVIEW OF SYSTEMS (Negative unless checked)  Constitutional: '[]'$ Weight loss  '[]'$ Fever  '[]'$ Chills Cardiac: '[]'$ Chest pain   '[]'$ Chest pressure   '[]'$ Palpitations   '[]'$ Shortness of breath when laying flat   '[]'$ Shortness of breath at rest   '[]'$ Shortness of breath with exertion. Vascular:  '[]'$ Pain in legs with walking   '[x]'$ Pain in legs at rest   '[]'$ Pain in legs when laying flat   '[]'$ Claudication   '[]'$ Pain in feet when walking  '[]'$ Pain in feet at rest  '[]'$ Pain in feet when laying flat   '[]'$ History of DVT   '[]'$ Phlebitis   '[x]'$ Swelling in legs   '[x]'$ Varicose veins   '[]'$ Non-healing ulcers Pulmonary:   '[]'$ Uses home oxygen   '[]'$ Productive cough   '[]'$ Hemoptysis   '[]'$ Wheeze  '[]'$ COPD   '[x]'$ Asthma Neurologic:  '[]'$ Dizziness  '[]'$ Blackouts   '[]'$ Seizures   '[]'$ History of stroke   '[]'$ History of TIA  '[]'$ Aphasia   '[]'$ Temporary blindness   '[]'$ Dysphagia   '[]'$ Weakness or numbness in arms   '[]'$ Weakness or numbness in legs Musculoskeletal:  '[x]'$ Arthritis   '[]'$ Joint swelling   '[]'$ Joint pain   '[]'$ Low back pain Hematologic:  '[]'$ Easy bruising  '[]'$ Easy bleeding   '[]'$ Hypercoagulable state   '[]'$ Anemic  '[]'$ Hepatitis Gastrointestinal:  '[]'$ Blood in stool   '[]'$ Vomiting blood  '[x]'$ Gastroesophageal reflux/heartburn   '[]'$ Difficulty swallowing. Genitourinary:  '[]'$ Chronic kidney disease   '[]'$ Difficult urination  '[]'$ Frequent urination  '[]'$ Burning with urination   '[]'$ Blood in urine Skin:  '[]'$ Rashes   '[]'$ Ulcers   '[]'$ Wounds Psychological:  '[]'$ History of anxiety   '[]'$  History of major depression.  Physical Examination  Vitals:   05/19/22 0955 05/19/22 0956  BP: 123/79 (!) 142/83  Pulse: 80   Resp: 16   Weight: 177 lb (80.3 kg)   Height: '5\' 5"'$  (1.651 m)    Body mass index is 29.45 kg/m. Gen:  WD/WN, NAD Head: [place]/AT, No temporalis wasting. Ear/Nose/Throat:  Hearing grossly intact, nares w/o erythema or drainage, trachea midline Eyes: Conjunctiva clear. Sclera non-icteric Neck: Supple.  No bruit  Pulmonary:  Good air movement, equal and clear to auscultation bilaterally.  Cardiac: RRR, No JVD Vascular:  Vessel Right Left  Radial Palpable Palpable                          PT 1+ Palpable 1+ Palpable  DP 1+ Palpable 1+ Palpable   Gastrointestinal: soft, non-tender/non-distended. No guarding/reflex.  Musculoskeletal: M/S 5/5 throughout.  No deformity or atrophy. 1+ BLE edema. Neurologic: CN 2-12 intact. Sensation grossly intact in extremities.  Symmetrical.  Speech is fluent. Motor exam as listed above. Psychiatric: Judgment intact, Mood & affect appropriate for pt's clinical situation. Dermatologic: No rashes or ulcers noted.  No cellulitis or open wounds.     CBC Lab Results  Component Value Date   WBC 5.7 12/31/2021   HGB 13.0 12/31/2021   HCT 42.6 12/31/2021   MCV 99.8 12/31/2021   PLT 221 12/31/2021    BMET    Component Value Date/Time   NA 143 04/14/2022 1430   NA 138 04/10/2019 1439   K 4.2 04/14/2022 1430   CL 106 04/14/2022 1430   CO2 30 04/14/2022 1430   GLUCOSE 85 04/14/2022 1430   BUN 19 04/14/2022 1430   BUN 16 04/10/2019 1439   CREATININE 1.08 04/14/2022 1430   CALCIUM 9.2 04/14/2022 1430   GFRNONAA >60 12/31/2021 2200   GFRAA >  60 06/28/2020 0406   CrCl cannot be calculated (Patient's most recent lab result is older than the maximum 21 days allowed.).  COAG Lab Results  Component Value Date   INR 1.03 08/04/2013   INR 1.23 06/10/2011    Radiology SLEEP STUDY DOCUMENTS  Result Date: 05/04/2022 Ordered by an unspecified provider.    Assessment/Plan Carotid stenosis, left Duplex today shows mild right carotid stenosis in the 1 to 39% range and a widely patent left carotid stent.  Patient is doing well.  Continue current medical regimen.  Recheck in 1 year with duplex.  Essential hypertension,  benign blood pressure control important in reducing the progression of atherosclerotic disease. On appropriate oral medications.   HLD (hyperlipidemia) lipid control important in reducing the progression of atherosclerotic disease. Continue statin therapy   Pain in limb  Recommend:  The patient has atypical pain symptoms for pure atherosclerotic disease. However, on physical exam there is evidence of vascular disease, given the diminished pulses and the edema associated with venous changes of the legs.  Further investigation of the patient's vascular disease is necessary to determine the relationship of the patient's lower extremity symptoms and the degree of vascular disease.  Noninvasive studies of the will be obtained and the patient will follow up with me to review these studies.  I suspect the patient is c/o pseudoclaudication. There is likely a neuropathic component of the pain.  Patient should have an evaluation of his LS spine which I defer to either the primary service or the Spine service.  The patient should continue walking and begin a more formal exercise program. The patient should continue his antiplatelet therapy and aggressive treatment of the lipid abnormalities.    [name], MD  05/19/2022 10:38 AM    This note was created with Dragon medical transcription system.  Any errors from dictation are purely unintentional

## 2022-05-19 NOTE — Assessment & Plan Note (Signed)
blood pressure control important in reducing the progression of atherosclerotic disease. On appropriate oral medications.  

## 2022-05-19 NOTE — Assessment & Plan Note (Signed)
lipid control important in reducing the progression of atherosclerotic disease. Continue statin therapy  

## 2022-05-20 DIAGNOSIS — H2511 Age-related nuclear cataract, right eye: Secondary | ICD-10-CM | POA: Diagnosis not present

## 2022-05-20 NOTE — Telephone Encounter (Signed)
Sleep study has been received and placed in Dr. Domingo Dimes folder for review.

## 2022-05-20 NOTE — Telephone Encounter (Signed)
Sleep study has not been received.  Spoke to [place] with [facility] and requested report.

## 2022-05-28 ENCOUNTER — Ambulatory Visit: Payer: Medicare PPO | Admitting: Pulmonary Disease

## 2022-05-28 ENCOUNTER — Encounter: Payer: Self-pay | Admitting: Pulmonary Disease

## 2022-05-28 VITALS — BP 144/80 | HR 81 | Temp 98.0°F | Ht 65.0 in | Wt 179.6 lb

## 2022-05-28 DIAGNOSIS — I422 Other hypertrophic cardiomyopathy: Secondary | ICD-10-CM | POA: Diagnosis not present

## 2022-05-28 DIAGNOSIS — R0602 Shortness of breath: Secondary | ICD-10-CM

## 2022-05-28 DIAGNOSIS — G4710 Hypersomnia, unspecified: Secondary | ICD-10-CM

## 2022-05-28 DIAGNOSIS — J449 Chronic obstructive pulmonary disease, unspecified: Secondary | ICD-10-CM

## 2022-05-28 MED ORDER — BREZTRI AEROSPHERE 160-9-4.8 MCG/ACT IN AERO: 2.0000 | INHALATION_SPRAY | Freq: Two times a day (BID) | RESPIRATORY_TRACT | 0 refills | Status: DC

## 2022-05-28 NOTE — Telephone Encounter (Signed)
Results discussed with patient during 05/28/2022 OV.

## 2022-05-28 NOTE — Progress Notes (Signed)
Subjective:    Patient ID: Heather Beasley, female    DOB: 11-16-1955, 66 y.o.   MRN: [id number] Patient Care Team: Eugenia Pancoast, FNP as PCP - General (Family Medicine) Burnell Blanks, MD as PCP - Cardiology (Cardiology) Earnie Larsson, MD (Neurosurgery) Leeroy Cha, MD (Neurosurgery) Noreene Filbert, MD as Referring Physician (Radiation Oncology) Beverly Gust, MD (Otolaryngology)  Chief Complaint  Patient presents with   Follow-up    PFT and sleep study. Occ SOB with exertion and prod cough with white sputum in the morning.    HPI Heather Beasley is a 66 year old current smoker (33 PY) who presents for follow-up of an apparent lung nodule. In reviewing the patient's imaging it is noted that she had a CT angio for PE evaluation on 31 December 2021 for evaluation of shortness of breath post COVID-19 with suspicions for potential PE.  There was a 6 mm left lower lobe subsolid lung nodule that was "slightly larger" than prior film of January 2022.  She also had some mild bibasilar atelectasis without pneumonia and no evidence of pulmonary embolus.  However patient had her regular lung cancer screening CT on 14 January 2022 showing the subsolid nodule that had been previously noted as well.  The film was categorized as a lung RADS 2.  This is now being followed up during her regular lung cancer screening.  At her initial visit here on 27 January 2022 she was noted to have significant complaints of orthopnea had followed after she had COVID-19 in February 2023.  She also was complaining of shortness of breath on exertion.  She was having to sleep in a recliner due to her significant orthopnea.  She was also having issues with nocturnal awakenings" gasping for air.  She was then scheduled for PFTs and a sleep study.  She had PFTs on 05 Mar 2022 that were consistent with moderate COPD.  She is currently only on Advair and albuterol which do not seem to relieve her dyspnea.  She had a sleep study  on 24 April 2022 that showed no evidence of sleep apnea.  Think would not explain her daytime somnolence.   Prior 2D echo performed on 21 January 2022 which shows an EF of 70 to 75%, hyperdynamic LV, no wall motion abnormalities, moderate ASH and grade 1 diastolic dysfunction.  There is mild mitral valve regurgitation.  On a subsequent coronary CTA he was noted to have a PFO with shunting.  She also has mild coronary artery disease.   Of note she had nasal polypectomy in 2016 and the specimen was shown to have adenocarcinoma.  This was a stage I right nasal cavity adenocarcinoma she had adjuvant XRT in 2016 she did not require chemotherapy.  She has not had any evidence of recurrence or progressive disease since then.  She has been followed by oncology and ENT in this regard.   Review of Systems A 10 point review of systems was performed and it is as noted above otherwise negative.  Patient Active Problem List   Diagnosis Date Noted   Decreased breath sounds at right lung base 11/24/2022   Lumbar spinal stenosis due to adjacent segment disease after fusion procedure (L4-S1 fusion, Left L3/4) 11/11/2022   Lumbar post-laminectomy syndrome 11/11/2022   Chronic pain syndrome 11/11/2022   Sacroiliac joint pain 11/11/2022   Lumbar radiculopathy 07/17/2022   History of left foot drop 07/17/2022   Pain in limb 05/19/2022   Vitamin B12 deficiency 04/14/2022   Snoring 01/20/2022  DOE (dyspnea on exertion) 01/08/2022   Elevated brain natriuretic peptide (BNP) level 01/08/2022   Pulmonary emphysema ([place]) 01/08/2022   Pulmonary nodule less than 6 mm determined by computed tomography of lung 01/08/2022   Other fatigue 01/08/2022   Family history of breast cancer in female 11/17/2021   Carotid stenosis, left 06/27/2020   Primary osteoarthritis involving multiple joints 03/01/2018   Osteopenia 02/14/2018   HLD (hyperlipidemia) 12/01/2016   Irritable bowel syndrome with diarrhea 12/01/2016   Cancer of  nasal cavity and sinus ([place]) 07/07/2015   Essential hypertension, benign 08/08/2013   Mixed urge and stress incontinence 03/27/2013   Allergic rhinitis 12/13/2012   Chronic headaches 02/22/2012   GERD (gastroesophageal reflux disease) 02/22/2012   Vitamin D deficiency 01/04/2012   Social History   Tobacco Use   Smoking status: Every Day    Packs/day: 0.25    Years: 39.00    Total pack years: 9.75    Types: Cigarettes   Smokeless tobacco: Never   Tobacco comments:    3 -4 cigs daily  Substance Use Topics   Alcohol use: Not Currently   Allergies  Allergen Reactions   Albumin (Human) Rash   Amoxicillin-Pot Clavulanate Hives   Gabapentin Shortness Of Breath   Latex Dermatitis and Rash   Betadine [Povidone Iodine] Dermatitis, Rash and Other (See Comments)    Skin burning Topical betadine and iodine have this reaction with patient.  IV contrast is NOT a problem.  jkl   Sucralfate     Other reaction(s): Other (See Comments) Abdominal Pain   Amoxicillin     REACTION: rash   Chantix [Varenicline Tartrate]     Heart racing   Erythromycin     REACTION: rash   Sucralfate Nausea And Vomiting    Heart racing Lightheaded    Current Meds  Medication Sig   cetirizine (ZYRTEC) 10 MG tablet Take 10 mg by mouth daily. Allergies.   Cholecalciferol (VITAMIN D) 50 MCG (2000 UT) CAPS Take 2,000 Units by mouth.   clopidogrel (PLAVIX) 75 MG tablet TAKE 1 TABLET BY MOUTH ONCE A DAY   EPINEPHrine 0.3 mg/0.3 mL IJ SOAJ injection Inject 1 mg into the skin as needed.   [EXPIRED] famotidine (PEPCID) 40 MG tablet Take 1 tablet by mouth at bedtime.   Multiple Vitamins-Minerals (CENTRUM ADULTS PO) Take 1 tablet by mouth.   vitamin B-12 (CYANOCOBALAMIN) 1000 MCG tablet Take 1,000 mcg by mouth daily.   albuterol (VENTOLIN HFA) 108 (90 Base) MCG/ACT inhaler Inhale into the lungs every 6 (six) hours as needed. (Patient not taking: Reported on 11/11/2022)   cyclobenzaprine (FLEXERIL) 10 MG tablet Take  1 tablet (10 mg total) by mouth 3 (three) times daily as needed for muscle spasms.   Dextromethorphan-guaiFENesin (CORICIDIN HBP CONGESTION/COUGH) 10-200 MG CAPS Take by mouth.   fluticasone-salmeterol (ADVAIR DISKUS) 250-50 MCG/ACT AEPB Inhale 1 puff into the lungs in the morning and at bedtime.   osartan (COZAAR) 50 MG tablet Take 1 tablet (50 mg total) by mouth daily.   montelukast (SINGULAIR) 10 MG tablet Take 1 tablet (10 mg total) by mouth at bedtime.   Immunization History  Administered Date(s) Administered   Influenza Split 07/27/2011, 08/25/2012   Influenza Whole 09/14/2007   Influenza,inj,Quad PF,6+ Mos 08/10/2013, 08/01/2015, 07/16/2016, 08/02/2018, 07/15/2019   Influenza,inj,quad, With Preservative 08/13/2017   Influenza-Unspecified 07/26/2021   Pneumococcal Conjugate-13 07/16/2016   Pneumococcal Polysaccharide-23 11/27/2011, 03/01/2018        Objective:   Physical Exam BP (!) 144/80 (BP  Location: Left Arm, Cuff Size: Normal)   Pulse 81   Temp 98 F (36.7 C) (Temporal)   Ht 5' 5"$  (1.651 m)   Wt 179 lb 9.6 oz (81.5 kg)   SpO2 97%   BMI 29.89 kg/m   SpO2: 97 % O2 Device: None (Room air)  GENERAL: Overweight woman, no acute distress, fully ambulatory.  No conversational dyspnea.  Nasal quality to the speech. HEAD: Normocephalic, atraumatic.  EYES: Pupils equal, round, reactive to light.  No scleral icterus.  MOUTH: Class III airway.  No thrush. NECK: Supple. No thyromegaly. Trachea midline. No JVD.  No adenopathy. PULMONARY: Good air entry bilaterally.  Coarse otherwise, no adventitious sounds. CARDIOVASCULAR: S1 and S2. Regular rate and rhythm.  Grade 2/6 systolic ejection murmur which increases with Valsalva.  No gallop. ABDOMEN: Benign. MUSCULOSKELETAL: No joint deformity, no clubbing, no edema.  NEUROLOGIC: No overt focal deficit, no gait disturbance, speech is fluent. SKIN: Intact,warm,dry.  Nails are short (patient is a nail biter).PSYCH: Mood and behavior  normal.      Assessment & Plan:     ICD-10-CM   1. Shortness of breath  R06.02    Likely multifactorial: Poorly compensated COPD ASH Intracardiac shunt    2. Stage 2 moderate COPD by GOLD classification (HCC)  J44.9    Trial of Breztri 2 puffs twice a day Continue as needed albuterol Discontinue Advair    3. Asymmetric septal hypertrophy (HCC)  I42.2    This issue adds complexity to her management May be adding to her dyspnea symptom    4. Hypersomnolence  G47.10    No sleep apnea per sleep study May need to workup for potential narcolepsy Referral to sleep MD     Meds ordered this encounter  Medications   Budeson-Glycopyrrol-Formoterol (BREZTRI AEROSPHERE) 160-9-4.8 MCG/ACT AERO    Sig: Inhale 2 puffs into the lungs in the morning and at bedtime.    Dispense:  5.9 g    Refill:  0    Order Specific Question:   Lot Number?    Answer:   [id number]    Order Specific Question:   Expiration Date?    Answer:   09/25/2024    Order Specific Question:   Manufacturer?    Answer:   [employer] [[id number]]    Order Specific Question:   Quantity    Answer:   2   We will see the patient in follow-up in 2 to 3 months time she is to contact us prior to that time should any new difficulties arise.  Renold Don, MD Advanced Bronchoscopy PCCM [facility]    *This note was dictated using voice recognition software/Dragon.  Despite best efforts to proofread, errors can occur which can change the meaning. Any transcriptional errors that result from this process are unintentional and may not be fully corrected at the time of dictation.

## 2022-05-28 NOTE — Patient Instructions (Signed)
We are giving you a new inhaler to try which is Breztri 2 puffs twice a day.  Make sure you rinse your mouth well after you use it.  Do not use your Advair while using the Breztri.  Let us know how you do with the [facility].  If you do well with it we will call the prescription into your pharmacy.  Your breathing test showed that you have moderate COPD and the new inhaler should help with this.  Your sleep study did not show any sleep apnea.  I am concerned with your episodes of awakening so I am going to send the note to your cardiologist to see if a cardiac catheterization is the next step.  If your daytime sleepiness continues, let us know so we can schedule you to see one of our sleep providers that can sort this out for you.  Let us see you back in 2 to 3 months time call sooner should any new problems arise.

## 2022-06-01 DIAGNOSIS — J301 Allergic rhinitis due to pollen: Secondary | ICD-10-CM | POA: Diagnosis not present

## 2022-06-03 ENCOUNTER — Telehealth: Payer: Self-pay | Admitting: Pulmonary Disease

## 2022-06-03 MED ORDER — BREZTRI AEROSPHERE 160-9-4.8 MCG/ACT IN AERO: 2.0000 | INHALATION_SPRAY | Freq: Two times a day (BID) | RESPIRATORY_TRACT | 11 refills | Status: DC

## 2022-06-03 NOTE — Telephone Encounter (Signed)
Spoke to patient. She is requesting Rx for Tomas de Castro, as sample worked well for her.  Rx sent to preferred pharmacy.  Nothing further needed.

## 2022-06-08 ENCOUNTER — Other Ambulatory Visit: Payer: Self-pay | Admitting: Family

## 2022-06-08 DIAGNOSIS — J301 Allergic rhinitis due to pollen: Secondary | ICD-10-CM | POA: Diagnosis not present

## 2022-06-08 DIAGNOSIS — E7800 Pure hypercholesterolemia, unspecified: Secondary | ICD-10-CM

## 2022-06-15 DIAGNOSIS — J301 Allergic rhinitis due to pollen: Secondary | ICD-10-CM | POA: Diagnosis not present

## 2022-06-18 ENCOUNTER — Other Ambulatory Visit: Payer: Self-pay

## 2022-06-18 NOTE — Patient Outreach (Signed)
[facility]) Care Management  06/18/2022  LADASHA SCHNACKENBERG 14-Nov-1955 [id number]   Telephone Screen    Outreach call to patient to introduce [facility] services and assess care needs as part of benefit of PCP office and insurance plan. Spoke with patient. She denies any RN CM needs or concerns at this time. Agreeable to RN C mailing out contact info for future reference in case needs arise.    Main healthcare issue/concern today: Patient shares that she had recent cataracts surgery to both eyes a few weeks ago. She has a scratched cornea to left eye which is impairing vision. She is taking eye gtts and sxs improving. She goes back for MD appt on tomorrow. Patient voices that she started new inhaler-Breztri and it has worked very well. She is now abl to sleep in her bed and lay flat instead of recliner chair. She has not had to use her albuterol since starting med. MD office provided samples an she is running low.Concerns regarding expensive cost of med. Patient agreeable to pharmacy referral for possible med assistance.    Health Maintenance/Care Gaps Addressed & Education Provided:   -Last AWV: 03/04/22     Plan: RN CM completed pharmacy referral.   Enzo Montgomery, RN,BSN,CCM [facility] [place] [facility] Telephonic Care Management Coordinator Direct Phone: [phone] Toll Free: [phone] Fax: [phone]

## 2022-06-22 DIAGNOSIS — J301 Allergic rhinitis due to pollen: Secondary | ICD-10-CM | POA: Diagnosis not present

## 2022-06-30 ENCOUNTER — Telehealth: Payer: Self-pay

## 2022-06-30 NOTE — Patient Outreach (Signed)
   Angeles Zehner Apr 15, 1956 [id number]    06/30/22   RN CM received message from [facility] pharmacist that's patient has been unable to reach to discuss pharmacy referral. RN CM made outreach attempt to patient. No answer-voicemail message left along with contact info.    Enzo Montgomery, RN,BSN,CCM [place] [facility] Telephonic Care Management Coordinator Direct Phone: [phone] Toll Free: [phone] Fax: [phone]

## 2022-07-06 DIAGNOSIS — J301 Allergic rhinitis due to pollen: Secondary | ICD-10-CM | POA: Diagnosis not present

## 2022-07-07 ENCOUNTER — Telehealth: Payer: Self-pay | Admitting: Pharmacist

## 2022-07-07 NOTE — Progress Notes (Signed)
[facility])                                            [place] Team    07/07/2022  SHAMYAH STANTZ 04/28/1956 [id number]   Patient was referred to [place] Team for medication assistance with [facility]. Unfortunately, I have not been able to reach her by telephone.  She was called on:  08/25,08/29/, 09/05 & 09/12--messages were left on the voicemail each time.  Plan: Patient's protocol will be closed.  I will gladly reopen it upon hearing from the patient.  Elayne Guerin, PharmD, [place] Clinical Pharmacist [phone]

## 2022-07-08 DIAGNOSIS — Z8522 Personal history of malignant neoplasm of nasal cavities, middle ear, and accessory sinuses: Secondary | ICD-10-CM | POA: Diagnosis not present

## 2022-07-08 DIAGNOSIS — G4733 Obstructive sleep apnea (adult) (pediatric): Secondary | ICD-10-CM | POA: Diagnosis not present

## 2022-07-10 ENCOUNTER — Encounter (INDEPENDENT_AMBULATORY_CARE_PROVIDER_SITE_OTHER): Payer: Medicare PPO

## 2022-07-10 ENCOUNTER — Ambulatory Visit (INDEPENDENT_AMBULATORY_CARE_PROVIDER_SITE_OTHER): Payer: Medicare PPO | Admitting: Nurse Practitioner

## 2022-07-13 DIAGNOSIS — J301 Allergic rhinitis due to pollen: Secondary | ICD-10-CM | POA: Diagnosis not present

## 2022-07-17 ENCOUNTER — Ambulatory Visit (INDEPENDENT_AMBULATORY_CARE_PROVIDER_SITE_OTHER): Payer: Medicare PPO | Admitting: Family Medicine

## 2022-07-17 ENCOUNTER — Ambulatory Visit (INDEPENDENT_AMBULATORY_CARE_PROVIDER_SITE_OTHER)
Admission: RE | Admit: 2022-07-17 | Discharge: 2022-07-17 | Disposition: A | Discharge status: Home / Self Care | Payer: Medicare PPO | Source: Ambulatory Visit | Attending: Family Medicine | Admitting: Family Medicine

## 2022-07-17 ENCOUNTER — Encounter: Payer: Self-pay | Admitting: Family Medicine

## 2022-07-17 VITALS — BP 140/92 | HR 72 | Temp 97.6°F | Ht 65.0 in | Wt 183.0 lb

## 2022-07-17 DIAGNOSIS — M5416 Radiculopathy, lumbar region: Secondary | ICD-10-CM | POA: Diagnosis not present

## 2022-07-17 DIAGNOSIS — W19XXXA Unspecified fall, initial encounter: Secondary | ICD-10-CM | POA: Diagnosis not present

## 2022-07-17 DIAGNOSIS — Z8739 Personal history of other diseases of the musculoskeletal system and connective tissue: Secondary | ICD-10-CM

## 2022-07-17 DIAGNOSIS — M545 Low back pain, unspecified: Secondary | ICD-10-CM | POA: Diagnosis not present

## 2022-07-17 MED ORDER — LIDOCAINE 5 % EX PTCH: 1.0000 | MEDICATED_PATCH | CUTANEOUS | 0 refills | Status: DC

## 2022-07-17 MED ORDER — PREDNISONE 20 MG PO TABS: ORAL_TABLET | ORAL | 0 refills | Status: DC

## 2022-07-17 NOTE — Assessment & Plan Note (Signed)
Status post multiple back surgeries with resulting left foot drop and neuropathy.  Unchanged on exam today

## 2022-07-17 NOTE — Assessment & Plan Note (Signed)
Acute flare of chronic issue Status post multiple back surgeries including fusion. History of osteopenia.  Evaluate vertebral tenderness with x-ray of the lumbar spine. We will treat likely sciatic nerve irritation with prednisone taperand heat. NSAIDs contraindicated.  Allergy to gabapentin in the past.  She will follow-up with her primary provider in 2 to 4 weeks if back pain not returning to baseline for possible further evaluation versus referral to new back specialist given Dr. Cathrine Muster has retired.

## 2022-07-17 NOTE — Progress Notes (Signed)
Patient ID: Heather Beasley, female    DOB: 03/25/1956, 66 y.o.   MRN: [id number]  This visit was conducted in person.  BP (!) 140/92   Pulse 72   Temp 97.6 F (36.4 C) (Oral)   Ht '5\' 5"'$  (1.651 m)   Wt 183 lb (83 kg)   SpO2 97%   BMI 30.45 kg/m    CC:  Chief Complaint  Patient presents with   Back Pain    Low back since Wednesday-radiates around to groin and know to knee    Subjective:   HPI: Heather Beasley is a 66 y.o. female  patient of Eugenia Pancoast , NP presenting on 07/17/2022 for Back Pain (Low back since Wednesday-radiates around to groin and know to knee)    New onset low back pain following fall ( after standing a long time and resulting foot drop) Landed on right knee.   Since then pain in  bilateral low back and radiates to right leg to knee.  No new numbness or weakness. In right ;leg. Pain 8/1-0 on pain scale.  No fever  Pain with walking, sitting, better with lying down.  Has been using tylenol ES every 4-6 hours... on plavix so cannot use NSAIDs.  Flexeril has not helped.   Hx of neuropathy on left from past back issues... when up long.. left foot drop occurs chronically.  Allergic to gabapentin.   She has history of back issues for years, S/P back surgeries for ruptured disc. Dr. Joya Salm ( now retired)   S/P neck and lumbar fusion.  Last low back surgery 12 years ago.  Relevant past medical, surgical, family and social history reviewed and updated as indicated. Interim medical history since our last visit reviewed. Allergies and medications reviewed and updated. Outpatient Medications Prior to Visit  Medication Sig Dispense Refill   albuterol (VENTOLIN HFA) 108 (90 Base) MCG/ACT inhaler Inhale into the lungs every 6 (six) hours as needed.     atorvastatin (LIPITOR) 20 MG tablet TAKE 1 TABLET BY MOUTH ONCE A DAY 90 tablet 3   Azelastine HCl 137 MCG/SPRAY SOLN Place into both nostrils.     Budeson-Glycopyrrol-Formoterol (BREZTRI AEROSPHERE)  160-9-4.8 MCG/ACT AERO Inhale 2 puffs into the lungs in the morning and at bedtime. 10.7 g 11   cetirizine (ZYRTEC) 10 MG tablet Take 10 mg by mouth daily. Allergies.     Cholecalciferol (VITAMIN D) 50 MCG (2000 UT) CAPS Take 2,000 Units by mouth.     clopidogrel (PLAVIX) 75 MG tablet TAKE 1 TABLET BY MOUTH ONCE A DAY 90 tablet 3   cyclobenzaprine (FLEXERIL) 10 MG tablet Take 1 tablet (10 mg total) by mouth 3 (three) times daily as needed for muscle spasms. 30 tablet 0   Dextromethorphan-guaiFENesin (CORICIDIN HBP CONGESTION/COUGH) 10-200 MG CAPS Take by mouth.     DUREZOL 0.05 % EMUL Apply to eye.     EPINEPHrine 0.3 mg/0.3 mL IJ SOAJ injection Inject 1 mg into the skin as needed.     famotidine (PEPCID) 40 MG tablet Take 1 tablet by mouth at bedtime.     furosemide (LASIX) 20 MG tablet Take 20 mg by mouth every other day.     loperamide (IMODIUM A-D) 2 MG tablet Take 2 mg by mouth 2 (two) times daily as needed for diarrhea or loose stools.     losartan (COZAAR) 50 MG tablet Take 1 tablet (50 mg total) by mouth daily. 90 tablet 1   montelukast (SINGULAIR) 10 MG  tablet Take 1 tablet (10 mg total) by mouth at bedtime. 90 tablet 1   Multiple Vitamins-Minerals (CENTRUM ADULTS PO) Take 1 tablet by mouth.     pantoprazole (PROTONIX) 40 MG tablet Take 1 tablet (40 mg total) by mouth 2 (two) times daily. 60 tablet 11   triamcinolone cream (KENALOG) 0.1 % Apply 1 application. topically 2 (two) times daily as needed. 15 g 1   vitamin B-12 (CYANOCOBALAMIN) 1000 MCG tablet Take 1,000 mcg by mouth daily.     furosemide (LASIX) 20 MG tablet Take 1 tablet (20 mg total) by mouth daily. (Patient taking differently: Take 20 mg by mouth every other day.) 90 tablet 2   loperamide (IMODIUM A-D) 2 MG tablet Take 2 mg by mouth 2 (two) times a day.     ketorolac (ACULAR) 0.5 % ophthalmic solution SMARTSIG:In Eye(s)     moxifloxacin (VIGAMOX) 0.5 % ophthalmic solution Apply to eye.     No facility-administered  medications prior to visit.     Per HPI unless specifically indicated in ROS section below Review of Systems  Constitutional:  Negative for fatigue and fever.  HENT:  Negative for congestion.   Eyes:  Negative for pain.  Respiratory:  Negative for cough and shortness of breath.   Cardiovascular:  Negative for chest pain, palpitations and leg swelling.  Gastrointestinal:  Negative for abdominal pain.  Genitourinary:  Negative for dysuria and vaginal bleeding.  Musculoskeletal:  Positive for back pain.  Neurological:  Negative for syncope, light-headedness and headaches.  Psychiatric/Behavioral:  Negative for dysphoric mood.    Objective:  BP (!) 140/92   Pulse 72   Temp 97.6 F (36.4 C) (Oral)   Ht '5\' 5"'$  (1.651 m)   Wt 183 lb (83 kg)   SpO2 97%   BMI 30.45 kg/m   Wt Readings from Last 3 Encounters:  07/17/22 183 lb (83 kg)  05/28/22 179 lb 9.6 oz (81.5 kg)  05/19/22 177 lb (80.3 kg)      Physical Exam Constitutional:      General: She is not in acute distress.    Appearance: Normal appearance. She is well-developed. She is not ill-appearing or toxic-appearing.  HENT:     Head: Normocephalic.     Right Ear: Hearing, tympanic membrane, ear canal and external ear normal. Tympanic membrane is not erythematous, retracted or bulging.     Left Ear: Hearing, tympanic membrane, ear canal and external ear normal. Tympanic membrane is not erythematous, retracted or bulging.     Nose: No mucosal edema or rhinorrhea.     Right Sinus: No maxillary sinus tenderness or frontal sinus tenderness.     Left Sinus: No maxillary sinus tenderness or frontal sinus tenderness.     Mouth/Throat:     Pharynx: Uvula midline.  Eyes:     General: Lids are normal. Lids are everted, no foreign bodies appreciated.     Conjunctiva/sclera: Conjunctivae normal.     Pupils: Pupils are equal, round, and reactive to light.  Neck:     Thyroid: No thyroid mass or thyromegaly.     Vascular: No carotid bruit.      Trachea: Trachea normal.  Cardiovascular:     Rate and Rhythm: Normal rate and regular rhythm.     Pulses: Normal pulses.     Heart sounds: Normal heart sounds, S1 normal and S2 normal. No murmur heard.    No friction rub. No gallop.  Pulmonary:     Effort: Pulmonary effort is normal.  No tachypnea or respiratory distress.     Breath sounds: Normal breath sounds. No decreased breath sounds, wheezing, rhonchi or rales.  Abdominal:     General: Bowel sounds are normal.     Palpations: Abdomen is soft.     Tenderness: There is no abdominal tenderness.  Musculoskeletal:     Cervical back: Neck supple. Decreased range of motion.     Lumbar back: Tenderness and bony tenderness present. Decreased range of motion. Positive right straight leg raise test. Negative left straight leg raise test.  Skin:    General: Skin is warm and dry.     Findings: No rash.  Neurological:     Mental Status: She is alert.     Cranial Nerves: Cranial nerves 2-12 are intact.     Sensory: Sensation is intact.     Motor: Weakness present.     Coordination: Coordination is intact.     Gait: Gait abnormal.     Comments: Left foot drop  Psychiatric:        Mood and Affect: Mood is not anxious or depressed.        Speech: Speech normal.        Behavior: Behavior normal. Behavior is cooperative.        Thought Content: Thought content normal.        Judgment: Judgment normal.       Results for orders placed or performed in visit on 04/14/22  Comprehensive metabolic panel  Result Value Ref Range   Sodium 143 135 - 145 mEq/L   Potassium 4.2 3.5 - 5.1 mEq/L   Chloride 106 96 - 112 mEq/L   CO2 30 19 - 32 mEq/L   Glucose, Bld 85 70 - 99 mg/dL   BUN 19 6 - 23 mg/dL   Creatinine, Ser 1.08 0.40 - 1.20 mg/dL   Total Bilirubin 0.8 0.2 - 1.2 mg/dL   Alkaline Phosphatase 94 39 - 117 U/L   AST 17 0 - 37 U/L   ALT 11 0 - 35 U/L   Total Protein 6.4 6.0 - 8.3 g/dL   Albumin 4.1 3.5 - 5.2 g/dL   GFR 54.54 (L)  >60.00 mL/min   Calcium 9.2 8.4 - 10.5 mg/dL  B12 and Folate Panel  Result Value Ref Range   Vitamin B-12 451 211 - 911 pg/mL   Folate 6.3 >5.9 ng/mL  TSH  Result Value Ref Range   TSH 3.66 0.35 - 5.50 uIU/mL  VITAMIN D 25 Hydroxy (Vit-D Deficiency, Fractures)  Result Value Ref Range   VITD 40.45 30.00 - 100.00 ng/mL     COVID 19 screen:  No recent travel or known exposure to COVID19 The patient denies respiratory symptoms of COVID 19 at this time. The importance of social distancing was discussed today.   Assessment and Plan Problem List Items Addressed This Visit     Accidental fall    Acute, fall secondary to chronic left foot drop from previous back surgeries.  Reviewed fall precautions and home safety.      History of left foot drop    Status post multiple back surgeries with resulting left foot drop and neuropathy.  Unchanged on exam today      Lumbar back pain with radiculopathy affecting right lower extremity - Primary    Acute flare of chronic issue Status post multiple back surgeries including fusion. History of osteopenia.  Evaluate vertebral tenderness with x-ray of the lumbar spine. We will treat likely sciatic nerve irritation with  prednisone taperand heat. NSAIDs contraindicated.  Allergy to gabapentin in the past.  She will follow-up with her primary provider in 2 to 4 weeks if back pain not returning to baseline for possible further evaluation versus referral to new back specialist given Dr. Cathrine Muster has retired.      Relevant Medications   predniSONE (DELTASONE) 20 MG tablet   Other Relevant Orders   DG Lumbar Spine Complete     Eliezer Lofts, MD

## 2022-07-17 NOTE — Assessment & Plan Note (Signed)
Acute, fall secondary to chronic left foot drop from previous back surgeries.  Reviewed fall precautions and home safety.

## 2022-07-20 ENCOUNTER — Encounter: Payer: Self-pay | Admitting: Family Medicine

## 2022-07-22 ENCOUNTER — Other Ambulatory Visit (INDEPENDENT_AMBULATORY_CARE_PROVIDER_SITE_OTHER): Payer: Medicare PPO

## 2022-07-22 ENCOUNTER — Other Ambulatory Visit: Payer: Self-pay | Admitting: Family Medicine

## 2022-07-22 DIAGNOSIS — M79606 Pain in leg, unspecified: Secondary | ICD-10-CM

## 2022-07-22 LAB — CBC WITH DIFFERENTIAL/PLATELET
Basophils Absolute: 0 10*3/uL (ref 0.0–0.1)
Basophils Relative: 0.2 % (ref 0.0–3.0)
Eosinophils Absolute: 0 10*3/uL (ref 0.0–0.7)
Eosinophils Relative: 0 % (ref 0.0–5.0)
HCT: 39.8 % (ref 36.0–46.0)
Hemoglobin: 13.1 g/dL (ref 12.0–15.0)
Lymphocytes Relative: 11.5 % — ABNORMAL LOW (ref 12.0–46.0)
Lymphs Abs: 0.8 10*3/uL (ref 0.7–4.0)
MCHC: 33 g/dL (ref 30.0–36.0)
MCV: 98.2 fl (ref 78.0–100.0)
Monocytes Absolute: 0.4 10*3/uL (ref 0.1–1.0)
Monocytes Relative: 6.2 % (ref 3.0–12.0)
Neutro Abs: 5.7 10*3/uL (ref 1.4–7.7)
Neutrophils Relative %: 82.1 % — ABNORMAL HIGH (ref 43.0–77.0)
Platelets: 198 10*3/uL (ref 150.0–400.0)
RBC: 4.05 Mil/uL (ref 3.87–5.11)
RDW: 15.1 % (ref 11.5–15.5)
WBC: 7 10*3/uL (ref 4.0–10.5)

## 2022-07-22 LAB — CK: Total CK: 50 U/L (ref 7–177)

## 2022-07-24 DIAGNOSIS — J301 Allergic rhinitis due to pollen: Secondary | ICD-10-CM | POA: Diagnosis not present

## 2022-07-26 DIAGNOSIS — M5416 Radiculopathy, lumbar region: Secondary | ICD-10-CM | POA: Diagnosis not present

## 2022-07-27 DIAGNOSIS — J301 Allergic rhinitis due to pollen: Secondary | ICD-10-CM | POA: Diagnosis not present

## 2022-07-30 ENCOUNTER — Ambulatory Visit: Payer: Medicare PPO | Admitting: Pulmonary Disease

## 2022-07-30 ENCOUNTER — Encounter: Payer: Self-pay | Admitting: Pulmonary Disease

## 2022-07-30 VITALS — BP 136/86 | HR 81 | Temp 97.8°F | Ht 65.0 in | Wt 184.6 lb

## 2022-07-30 DIAGNOSIS — R911 Solitary pulmonary nodule: Secondary | ICD-10-CM | POA: Diagnosis not present

## 2022-07-30 DIAGNOSIS — R0602 Shortness of breath: Secondary | ICD-10-CM

## 2022-07-30 DIAGNOSIS — I517 Cardiomegaly: Secondary | ICD-10-CM

## 2022-07-30 DIAGNOSIS — J449 Chronic obstructive pulmonary disease, unspecified: Secondary | ICD-10-CM | POA: Diagnosis not present

## 2022-07-30 DIAGNOSIS — I422 Other hypertrophic cardiomyopathy: Secondary | ICD-10-CM

## 2022-07-30 MED ORDER — BREZTRI AEROSPHERE 160-9-4.8 MCG/ACT IN AERO: 2.0000 | INHALATION_SPRAY | Freq: Two times a day (BID) | RESPIRATORY_TRACT | 0 refills | Status: DC

## 2022-07-30 MED ORDER — BREZTRI AEROSPHERE 160-9-4.8 MCG/ACT IN AERO: 2.0000 | INHALATION_SPRAY | Freq: Two times a day (BID) | RESPIRATORY_TRACT | 10 refills | Status: DC

## 2022-07-30 NOTE — Progress Notes (Signed)
Subjective:    Patient ID: Heather Beasley, female    DOB: 1955/12/09, 66 y.o.   MRN: [id number]  Patient Care Team: Mort Sawyers, FNP as PCP - General (Family Medicine) Kathleene Hazel, MD as PCP - Cardiology (Cardiology) Carmina Miller, MD as Referring Physician (Radiation Oncology) Linus Salmons, MD (Otolaryngology) Venetia Night, MD as Consulting Physician (Neurosurgery) Georgiana Spinner, NP as Nurse Practitioner (Vascular Surgery)  Chief Complaint  Patient presents with   Follow-up    No SOB. Cough with clear sputum.   HPI Heather Beasley is a 66 year old recent former smoker (50 PY) using vape, who presents for follow-up of an apparent lung nodule. In reviewing the patient's imaging it is noted that she had a CT angio for PE evaluation on 31 December 2021 for evaluation of shortness of breath post COVID-19 with suspicions for potential PE.  There was a 6 mm left lower lobe subsolid lung nodule that was "slightly larger" than prior film of January 2022.  She also had some mild bibasilar atelectasis without pneumonia and no evidence of pulmonary embolus.  However patient had her regular lung cancer screening CT on 14 January 2022 showing the subsolid nodule that had been previously noted as well.  The film was categorized as a lung RADS 2.  This is now being followed up during her regular lung cancer screening.  At her initial visit here on 27 January 2022 she was noted to have significant complaints of orthopnea had followed after she had COVID-19 in February 2023.  She also was complaining of shortness of breath on exertion.  She was having to sleep in a recliner due to her significant orthopnea.  She was also having issues with nocturnal awakenings" gasping for air.  She was then scheduled for PFTs and a sleep study.   She had PFTs on 05 Mar 2022 that were consistent with moderate COPD.  She is currently on Breztri which was instituted at her prior visit of 28 May 2022.  She  notes that this medication helps her tremendously.  She had a sleep study on 24 April 2022 that showed no evidence of sleep apnea.  This did not explain her issues with daytime somnolence.  She does note her hypersomnolence is getting better.   Prior 2D echo performed on 21 January 2022 which shows an EF of 70 to 75%, hyperdynamic LV, no wall motion abnormalities, moderate ASH and grade 1 diastolic dysfunction.  There is mild mitral valve regurgitation.  On a subsequent coronary CTA he was noted to have a PFO with shunting.  She also has mild coronary artery disease.  She follows with cardiology for all of these issues.   Of note she had nasal polypectomy in 2016 and the specimen was shown to have adenocarcinoma.  This was a stage I right nasal cavity adenocarcinoma she had adjuvant XRT in 2016 she did not require chemotherapy.  She has not had any evidence of recurrence or progressive disease since then.  She has been followed by oncology and ENT in this regard.  Today she presents feeling well since [name] was instituted.  She has decreased her use of albuterol to perhaps only twice a week.  Has not had any issues with shortness of breath.  She has occasional cough productive of clear sputum.  Review of Systems A 10 point review of systems was performed and it is as noted above otherwise negative.  Patient Active Problem List   Diagnosis Date Noted  Lumbar back pain with radiculopathy affecting right lower extremity 07/17/2022   History of left foot drop 07/17/2022   Accidental fall 07/17/2022   Pain in limb 05/19/2022   Vitamin B12 deficiency 04/14/2022   Snoring 01/20/2022   DOE (dyspnea on exertion) 01/08/2022   Elevated brain natriuretic peptide (BNP) level 01/08/2022   Pulmonary emphysema (HCC) 01/08/2022   Pulmonary nodule less than 6 mm determined by computed tomography of lung 01/08/2022   Other fatigue 01/08/2022   Family history of breast cancer in female 11/17/2021   Carotid  stenosis, left 06/27/2020   Primary osteoarthritis involving multiple joints 03/01/2018   Osteopenia 02/14/2018   HLD (hyperlipidemia) 12/01/2016   Irritable bowel syndrome with diarrhea 12/01/2016   Cancer of nasal cavity and sinus (HCC) 07/07/2015   Essential hypertension, benign 08/08/2013   Mixed urge and stress incontinence 03/27/2013   Allergic rhinitis 12/13/2012   Chronic headaches 02/22/2012   GERD (gastroesophageal reflux disease) 02/22/2012   Vitamin D deficiency 01/04/2012   Social History   Tobacco Use   Smoking status: Former    Packs/day: 1.00    Years: 39.00    Additional pack years: 0.00    Total pack years: 39.00    Types: Cigarettes    Quit date: 12/19/2022    Years since quitting: 0.1   Smokeless tobacco: Never   Tobacco comments:    Using vape  Substance Use Topics   Alcohol use: Not Currently   Medications were reviewed with the patient.  Immunization History  Administered Date(s) Administered   Influenza Split 07/27/2011, 08/25/2012   Influenza Whole 09/14/2007   Influenza,inj,Quad PF,6+ Mos 08/10/2013, 08/01/2015, 07/16/2016, 08/02/2018, 07/15/2019   Influenza,inj,quad, With Preservative 08/13/2017   Influenza-Unspecified 07/26/2021, 08/05/2022   Pneumococcal Conjugate-13 07/16/2016   Pneumococcal Polysaccharide-23 11/27/2011, 03/01/2018        Objective:   Physical Exam BP 136/86 (BP Location: Left Arm, Cuff Size: Normal)   Pulse 81   Temp 97.8 F (36.6 C)   Ht  (1.651 m)   Wt 184 lb 9.6 oz (83.7 kg)   SpO2 96%   BMI 30.72 kg/m   SpO2: 96 % O2 Device: None (Room air)  GENERAL: Overweight woman, no acute distress, fully ambulatory.  No conversational dyspnea.  Nasal quality to the speech. HEAD: Normocephalic, atraumatic.  EYES: Pupils equal, round, reactive to light.  No scleral icterus.  MOUTH: Class III airway.  No thrush. NECK: Supple. No thyromegaly. Trachea midline. No JVD.  No adenopathy. PULMONARY: Good air entry  bilaterally.  Coarse otherwise, no adventitious sounds. CARDIOVASCULAR: S1 and S2. Regular rate and rhythm.  Grade 2/6 systolic ejection murmur which increases with Valsalva.  No gallop. ABDOMEN: Benign. MUSCULOSKELETAL: No joint deformity, no clubbing, no edema.  NEUROLOGIC: Chronic left foot drop.  This affects gait.  No other localizing finding. SKIN: Intact,warm,dry.  Nails are short (patient is a nail biter). PSYCH: Mood and behavior normal.      Assessment & Plan:     ICD-10-CM   1. Stage 2 moderate COPD by GOLD classification (HCC)  J44.9    Continue Breztri 2 puffs twice a day Continue as needed albuterol    2. Shortness of breath  R06.02    Markedly improved on Breztri Other factors: ASH, deconditioning    3. Asymmetric septal hypertrophy (HCC)  I42.2    This issue adds complexity to her management Follows with cardiology    4. Nodule of lower lobe of left lung  R91.1  8 mm, subsolid Unchanged Continue to follow     Meds ordered this encounter  Medications   Budeson-Glycopyrrol-Formoterol (BREZTRI AEROSPHERE) 160-9-4.8 MCG/ACT AERO    Sig: Inhale 2 puffs into the lungs in the morning and at bedtime.    Dispense:  5.9 g    Refill:  0    Order Specific Question:   Lot Number?    Answer:   [id number]    Order Specific Question:   Expiration Date?    Answer:   09/25/2024    Order Specific Question:   Manufacturer?    Answer:   [employer] [[id number]]    Order Specific Question:   Quantity    Answer:   1   Budeson-Glycopyrrol-Formoterol (BREZTRI AEROSPHERE) 160-9-4.8 MCG/ACT AERO    Sig: Inhale 2 puffs into the lungs in the morning and at bedtime.    Dispense:  5.9 g    Refill:  10    Order Specific Question:   Lot Number?    Answer:   [id number]    Order Specific Question:   Expiration Date?    Answer:   11/26/2024    Order Specific Question:   Manufacturer?    Answer:   [employer] [[id number]]    Order Specific Question:   Quantity    Answer:   1   Will see the patient  in follow-up in 4 months time she is to contact us prior to that time should any new difficulties arise.  Gailen Shelter, MD Advanced Bronchoscopy PCCM [facility]    *This note was dictated using voice recognition software/Dragon.  Despite best efforts to proofread, errors can occur which can change the meaning. Any transcriptional errors that result from this process are unintentional and may not be fully corrected at the time of dictation.   Marland Kitchen

## 2022-07-30 NOTE — Patient Instructions (Addendum)
We have given you some samples of Breztri.  Recommend you get both flu and RSV vaccines.  We will see on follow-up in 4 months time call sooner should any new problems arise.

## 2022-08-03 DIAGNOSIS — J301 Allergic rhinitis due to pollen: Secondary | ICD-10-CM | POA: Diagnosis not present

## 2022-08-05 NOTE — Progress Notes (Signed)
Referring Physician:  Eugenia Beasley, [place],  [place] [id number]  Primary Physician:  Heather Pancoast, FNP  History of Present Illness: 08/05/2022 [name] has a history of cervical and lumbar fusion, last lumbar surgery was 12 years ago. She has left foot drop.   Also with history of cancer of sinus, GERD, glaucoma, HTN, IBS.   Was given prednisone and lidoderm by PCP. No NSAIDs due to PLAVIX. She has allergy to neurotin.   She is smoker (1/2 ppd).   She has constant LBP with right hip pain into her groin x 3-4 weeks when she had a fall (tripped over her left foot). She also has pain in anterior thigh, not below her knee. She notes numbness/tingling in right leg (entire leg). These symptoms are new since her fall. No left leg pain. Pain is worse with standing and bending. She is using a cane to walk. Feels some instability in her right leg with walking.   No relief with prednisone.   No bowel or bladder issues.   Conservative measures:  Physical therapy: nothing recent  Multimodal medical therapy including regular antiinflammatories: prednisone, flexeril, tylenol Injections: no recent epidural steroid injections  Past Surgery: cervical and lumbar fusion. Last lumbar surgery 12 years ago. Has left foot drop.   Heather Beasley has no symptoms of cervical myelopathy.  The symptoms are causing a significant impact on the patient's life.   Review of Systems:  A 10 point review of systems is negative, except for the pertinent positives and negatives detailed in the HPI.  Past Medical History: Past Medical History:  Diagnosis Date   Allergic rhinitis    Arthritis    Asthma    Cancer of nasal cavity and sinus ([place]) 07/07/2015   radiation   Cervical spondylosis    Colitis    Colon polyps    Depression    Diverticulosis    GERD (gastroesophageal reflux disease)    Glaucoma    Headaches, cluster    HTN (hypertension)    Hyperlipidemia     IBS (irritable bowel syndrome)    Kidney stones    Melanoma ([place])    above the L knee    Neuropathy    PONV (postoperative nausea and vomiting)    Tobacco abuse     Past Surgical History: Past Surgical History:  Procedure Laterality Date   [place]     CAROTID PTA/STENT INTERVENTION Left 06/27/2020   Procedure: CAROTID PTA/STENT INTERVENTION;  Surgeon: Algernon Huxley, MD;  Location: [facility];  Service: Cardiovascular;  Laterality: Left;   CERVICAL FUSION  2010   CHOLECYSTECTOMY  08/2011   Dr Rochel Brome   COLONOSCOPY     Dr Tiffany Kocher   CYSTOSCOPY     Dr. Bernardo Heater   DILATION AND CURETTAGE OF UTERUS     ESOPHAGOGASTRODUODENOSCOPY  2012   Dr Tiffany Kocher   FRACTURE SURGERY  2001   plate in right wrist and arm   IMAGE GUIDED SINUS SURGERY N/A 06/25/2015   Procedure: IMAGE GUIDED SINUS SURGERY;  Surgeon: Beverly Gust, MD;  Location: [facility];  Service: ENT;  Laterality: N/A;   LUMBAR SPINE SURGERY  07/2004   OOPHORECTOMY     POLYPECTOMY N/A 06/25/2015   Procedure: POLYPECTOMY NASAL;  Surgeon: Beverly Gust, MD;  Location: [facility];  Service: ENT;  Laterality: N/A;  TONSILLECTOMY  1968   VAGINAL DELIVERY     x2    Allergies: Allergies as of 08/07/2022 - Review Complete 07/30/2022  Allergen Reaction Noted   Albumin (human) Rash 02/26/2015   Amoxicillin-pot clavulanate Hives 02/12/2012   Gabapentin Shortness Of Breath 02/13/2014   Latex Dermatitis and Rash 05/13/2011   Betadine [povidone iodine] Dermatitis, Rash, and Other (See Comments) 05/13/2011   Sucralfate  02/26/2015   Amoxicillin  09/14/2007   Chantix [varenicline tartrate]  01/04/2012   Erythromycin  09/14/2007   Sucralfate Nausea And Vomiting 01/04/2012    Medications: Outpatient Encounter Medications as of 08/07/2022  Medication Sig   albuterol (VENTOLIN HFA) 108 (90 Base) MCG/ACT inhaler Inhale into the lungs every 6 (six)  hours as needed.   atorvastatin (LIPITOR) 20 MG tablet TAKE 1 TABLET BY MOUTH ONCE A DAY   Azelastine HCl 137 MCG/SPRAY SOLN Place into both nostrils.   Budeson-Glycopyrrol-Formoterol (BREZTRI AEROSPHERE) 160-9-4.8 MCG/ACT AERO Inhale 2 puffs into the lungs in the morning and at bedtime.   Budeson-Glycopyrrol-Formoterol (BREZTRI AEROSPHERE) 160-9-4.8 MCG/ACT AERO Inhale 2 puffs into the lungs in the morning and at bedtime.   Budeson-Glycopyrrol-Formoterol (BREZTRI AEROSPHERE) 160-9-4.8 MCG/ACT AERO Inhale 2 puffs into the lungs in the morning and at bedtime.   cetirizine (ZYRTEC) 10 MG tablet Take 10 mg by mouth daily. Allergies.   Cholecalciferol (VITAMIN D) 50 MCG (2000 UT) CAPS Take 2,000 Units by mouth.   clopidogrel (PLAVIX) 75 MG tablet TAKE 1 TABLET BY MOUTH ONCE A DAY   cyclobenzaprine (FLEXERIL) 10 MG tablet Take 1 tablet (10 mg total) by mouth 3 (three) times daily as needed for muscle spasms.   Dextromethorphan-guaiFENesin (CORICIDIN HBP CONGESTION/COUGH) 10-200 MG CAPS Take by mouth.   DUREZOL 0.05 % EMUL Apply to eye. (Patient not taking: Reported on 07/30/2022)   EPINEPHrine 0.3 mg/0.3 mL IJ SOAJ injection Inject 1 mg into the skin as needed.   famotidine (PEPCID) 40 MG tablet Take 1 tablet by mouth at bedtime.   furosemide (LASIX) 20 MG tablet Take 20 mg by mouth every other day.   lidocaine (LIDODERM) 5 % Place 1 patch onto the skin daily. Remove & Discard patch within 12 hours or as directed by MD   loperamide (IMODIUM A-D) 2 MG tablet Take 2 mg by mouth 2 (two) times daily as needed for diarrhea or loose stools.   losartan (COZAAR) 50 MG tablet Take 1 tablet (50 mg total) by mouth daily.   montelukast (SINGULAIR) 10 MG tablet Take 1 tablet (10 mg total) by mouth at bedtime.   Multiple Vitamins-Minerals (CENTRUM ADULTS PO) Take 1 tablet by mouth.   pantoprazole (PROTONIX) 40 MG tablet Take 1 tablet (40 mg total) by mouth 2 (two) times daily.   predniSONE (DELTASONE) 20 MG tablet  3 tabs by mouth daily x 3 days, then 2 tabs by mouth daily x 2 days then 1 tab by mouth daily x 2 days (Patient not taking: Reported on 07/30/2022)   triamcinolone cream (KENALOG) 0.1 % Apply 1 application. topically 2 (two) times daily as needed. (Patient not taking: Reported on 07/30/2022)   vitamin B-12 (CYANOCOBALAMIN) 1000 MCG tablet Take 1,000 mcg by mouth daily.   No facility-administered encounter medications on file as of 08/07/2022.    Social History: Social History   Tobacco Use   Smoking status: Every Day    Packs/day: 1.00    Years: 39.00    Total pack years: 39.00    Types: Cigarettes   Smokeless tobacco:  Never   Tobacco comments:    6-8 cigs daily-05/28/2022  Substance Use Topics   Alcohol use: No   Drug use: No    Family Medical History: Family History  Problem Relation Age of Onset   Hypertension Mother    Hyperlipidemia Mother    Diabetes Mother    Colon cancer Father    Pancreatic cancer Father    Cancer Father        Colon & Pancreatic   Breast cancer Paternal Grandmother 62   Diabetes Paternal Grandmother    Coronary artery disease Paternal Grandmother    Heart failure Paternal Grandmother    Cancer Paternal Grandmother        breast   Stroke Brother     Physical Examination: There were no vitals filed for this visit.  General: Patient is well developed, well nourished, calm, collected, and in no apparent distress. Attention to examination is appropriate.  Respiratory: Patient is breathing without any difficulty.   NEUROLOGICAL:     Awake, alert, oriented to person, place, and time.  Speech is clear and fluent. Fund of knowledge is appropriate.   Cranial Nerves: Pupils equal round and reactive to light.  Facial tone is symmetric.  Facial sensation is symmetric.  ROM of spine:  Limited ROM of lumbar spine with pain  No abnormal lesions on exposed skin.   Mild right sided lower lumbar tenderness. Mild tenderness over SI joint.   No pain  with IR/ER of bilateral hips.   Strength: Side Biceps Triceps Deltoid Interossei Grip Wrist Ext. Wrist Flex.  R '5 5 5 5 5 5 5  '$ L '5 5 5 5 5 5 5   '$ Side Iliopsoas Quads Hamstring PF DF EHL  R '5 5 5 5 5 5  '$ L '5 5 5 5 4 4   '$ Reflexes are 2+ and symmetric at the biceps, triceps, brachioradialis, patella and achilles.   Hoffman's is absent.  Clonus is not present.   Bilateral upper and lower extremity sensation is intact to light touch,but diminished in both legs.   Gait is normal.    Medical Decision Making  Imaging: Lumbar xrays dated 07/17/22:  FINDINGS: No fracture, bone lesion or spondylolisthesis.   Mild levoscoliosis, apex at L3.   Previous L4 through S1 posterior lumbar spine fusion, with peak hardware well-seated and positioned and unchanged.   Small endplate osteophytes at L1-L2 and L2-L3, without significant loss of disc height.   Scattered aortic atherosclerotic calcifications.   IMPRESSION: 1. No fracture or acute finding.  No spondylolisthesis. 2. Minor disc degenerative changes at L2-L3 and L3-L4 similar to the prior radiographs. 3. Mild stable levoscoliosis. 4. L4 through S1 posterior fusion hardware well-seated and unchanged.     Electronically Signed   By: Lajean Manes M.D.   On: 07/20/2022 13:07        I have personally reviewed the images and agree with the above interpretation.  Assessment and Plan: Ms. Collar is a pleasant 66 y.o. female with history of multiple cervical/lumbar surgeries. Last had lumbar fusion 12 years ago and has left foot drop.   Was doing well until a fall 3-4 weeks ago. Now with constant LBP with right hip pain into her groin. She also has pain in anterior thigh, not below her knee. She notes numbness/tingling in right leg (entire leg).   Fusion at L4-S1 looks good. She has adjacent level spondylosis at L3-L4.   Left DF/EHL weakness is chronic.  Treatment options discussed with patient  and following plan made:   -  Recommend MRI of lumbar spine to further evaluate right lumbar radiculopathy. No relief with medications or time. Previous lumbar fusion L4-S1.  - Of note, she has history of carotid stent placed in 2021. Card states it is MR conditional. Will send over to MRI department to see if MRI can be done. If not, will get CT of lumbar spine. Card scanned under media.  - Medications limited due to PLAVIX. Allergy to neurontin.  - Continue OTC tylenol as directed with food.  - Discussed PT. She wants to hold off. She is not sure this will be covered by her insurance.  - Will see her back to review MRI. Depending on results, may consider referral for injections.   I spent a total of 30 minutes in face-to-face and non-face-to-face activities related to this patient's care today.  Thank you for involving me in the care of this patient.   Geronimo Boot PA-C Dept. of Neurosurgery

## 2022-08-07 ENCOUNTER — Other Ambulatory Visit: Payer: Self-pay | Admitting: Family

## 2022-08-07 ENCOUNTER — Ambulatory Visit: Payer: Medicare PPO | Admitting: Orthopedic Surgery

## 2022-08-07 ENCOUNTER — Ambulatory Visit
Admission: RE | Admit: 2022-08-07 | Discharge: 2022-08-07 | Disposition: A | Discharge status: Home / Self Care | Payer: Self-pay | Source: Ambulatory Visit | Attending: Orthopedic Surgery | Admitting: Orthopedic Surgery

## 2022-08-07 ENCOUNTER — Encounter: Payer: Self-pay | Admitting: Orthopedic Surgery

## 2022-08-07 ENCOUNTER — Other Ambulatory Visit: Payer: Self-pay

## 2022-08-07 VITALS — BP 124/76 | Ht 65.0 in | Wt 182.8 lb

## 2022-08-07 DIAGNOSIS — Z049 Encounter for examination and observation for unspecified reason: Secondary | ICD-10-CM

## 2022-08-07 DIAGNOSIS — M47816 Spondylosis without myelopathy or radiculopathy, lumbar region: Secondary | ICD-10-CM

## 2022-08-07 DIAGNOSIS — M4726 Other spondylosis with radiculopathy, lumbar region: Secondary | ICD-10-CM

## 2022-08-07 DIAGNOSIS — Z981 Arthrodesis status: Secondary | ICD-10-CM

## 2022-08-07 DIAGNOSIS — M5416 Radiculopathy, lumbar region: Secondary | ICD-10-CM

## 2022-08-07 DIAGNOSIS — M62830 Muscle spasm of back: Secondary | ICD-10-CM

## 2022-08-07 NOTE — Addendum Note (Signed)
Addended byGeronimo Boot on: 08/07/2022 03:50 PM   Modules accepted: Orders

## 2022-08-10 DIAGNOSIS — J301 Allergic rhinitis due to pollen: Secondary | ICD-10-CM | POA: Diagnosis not present

## 2022-08-10 NOTE — Telephone Encounter (Signed)
Pt called in to know the states of RX cyclobenzaprine (FLEXERIL) 10 MG tablet refill stated pharmacy did not receive refill order . Please advise [phone]

## 2022-08-17 DIAGNOSIS — J301 Allergic rhinitis due to pollen: Secondary | ICD-10-CM | POA: Diagnosis not present

## 2022-08-18 ENCOUNTER — Ambulatory Visit
Admission: RE | Admit: 2022-08-18 | Discharge: 2022-08-18 | Disposition: A | Discharge status: Home / Self Care | Payer: Medicare PPO | Source: Ambulatory Visit | Attending: Orthopedic Surgery | Admitting: Orthopedic Surgery

## 2022-08-18 DIAGNOSIS — M48061 Spinal stenosis, lumbar region without neurogenic claudication: Secondary | ICD-10-CM | POA: Diagnosis not present

## 2022-08-18 DIAGNOSIS — Z981 Arthrodesis status: Secondary | ICD-10-CM | POA: Diagnosis not present

## 2022-08-18 DIAGNOSIS — M5416 Radiculopathy, lumbar region: Secondary | ICD-10-CM | POA: Diagnosis not present

## 2022-08-18 DIAGNOSIS — M47816 Spondylosis without myelopathy or radiculopathy, lumbar region: Secondary | ICD-10-CM | POA: Insufficient documentation

## 2022-08-18 DIAGNOSIS — M5126 Other intervertebral disc displacement, lumbar region: Secondary | ICD-10-CM | POA: Diagnosis not present

## 2022-08-24 ENCOUNTER — Encounter (INDEPENDENT_AMBULATORY_CARE_PROVIDER_SITE_OTHER): Payer: Self-pay

## 2022-08-24 DIAGNOSIS — J301 Allergic rhinitis due to pollen: Secondary | ICD-10-CM | POA: Diagnosis not present

## 2022-08-26 NOTE — Progress Notes (Unsigned)
Follow-up note: Referring Physician:  No referring provider defined for this encounter.  Primary Physician:  Eugenia Pancoast, FNP  Chief Complaint:  review of lumbar MRI  History of Present Illness: Heather Beasley is a 66 y.o. female who presents with constant LBP with right hip pain into her groin. She also has pain in anterior thigh, not below her knee. She notes numbness/tingling in right leg (entire leg).   History of cervical and lumbar fusion. Has chronic left foot drop. Fusion at L4-S1 looked good on previous xrays. She has adjacent level spondylosis at L3-L4.        Review of Systems:  A 10 point review of systems is negative, except for *** and the pertinent positives and negatives detailed in the HPI.  Past Medical History: Past Medical History:  Diagnosis Date   Allergic rhinitis    Arthritis    Asthma    Cancer of nasal cavity and sinus ([place]) 07/07/2015   radiation   Cervical spondylosis    Colitis    Colon polyps    Depression    Diverticulosis    GERD (gastroesophageal reflux disease)    Glaucoma    Headaches, cluster    HTN (hypertension)    Hyperlipidemia    IBS (irritable bowel syndrome)    Kidney stones    Melanoma ([place])    above the L knee    Neuropathy    PONV (postoperative nausea and vomiting)    Tobacco abuse     Past Surgical History: Past Surgical History:  Procedure Laterality Date   [place]     CAROTID PTA/STENT INTERVENTION Left 06/27/2020   Procedure: CAROTID PTA/STENT INTERVENTION;  Surgeon: Algernon Huxley, MD;  Location: [facility];  Service: Cardiovascular;  Laterality: Left;   CERVICAL FUSION  2010   CHOLECYSTECTOMY  08/2011   Dr Rochel Brome   COLONOSCOPY     Dr Tiffany Kocher   CYSTOSCOPY     Dr. Bernardo Heater   DILATION AND CURETTAGE OF UTERUS     ESOPHAGOGASTRODUODENOSCOPY  2012   Dr Tiffany Kocher   FRACTURE SURGERY  2001   plate in right wrist  and arm   IMAGE GUIDED SINUS SURGERY N/A 06/25/2015   Procedure: IMAGE GUIDED SINUS SURGERY;  Surgeon: Beverly Gust, MD;  Location: [facility];  Service: ENT;  Laterality: N/A;   LUMBAR SPINE SURGERY  07/2004   OOPHORECTOMY     POLYPECTOMY N/A 06/25/2015   Procedure: POLYPECTOMY NASAL;  Surgeon: Beverly Gust, MD;  Location: [facility];  Service: ENT;  Laterality: N/A;   [place]     x2    Allergies: Allergies as of 08/27/2022 - Review Complete 08/07/2022  Allergen Reaction Noted   Albumin (human) Rash 02/26/2015   Amoxicillin-pot clavulanate Hives 02/12/2012   Gabapentin Shortness Of Breath 02/13/2014   Latex Dermatitis and Rash 05/13/2011   Betadine [povidone iodine] Dermatitis, Rash, and Other (See Comments) 05/13/2011   Sucralfate  02/26/2015   Amoxicillin  09/14/2007   Chantix [varenicline tartrate]  01/04/2012   Erythromycin  09/14/2007   Sucralfate Nausea And Vomiting 01/04/2012    Medications: Outpatient Encounter Medications as of 08/27/2022  Medication Sig   albuterol (VENTOLIN HFA) 108 (90 Base) MCG/ACT inhaler Inhale into the lungs every 6 (six) hours as needed.   atorvastatin (LIPITOR) 20 MG tablet TAKE 1 TABLET BY MOUTH  ONCE A DAY   Azelastine HCl 137 MCG/SPRAY SOLN Place into both nostrils.   Budeson-Glycopyrrol-Formoterol (BREZTRI AEROSPHERE) 160-9-4.8 MCG/ACT AERO Inhale 2 puffs into the lungs in the morning and at bedtime.   Budeson-Glycopyrrol-Formoterol (BREZTRI AEROSPHERE) 160-9-4.8 MCG/ACT AERO Inhale 2 puffs into the lungs in the morning and at bedtime.   Budeson-Glycopyrrol-Formoterol (BREZTRI AEROSPHERE) 160-9-4.8 MCG/ACT AERO Inhale 2 puffs into the lungs in the morning and at bedtime.   cetirizine (ZYRTEC) 10 MG tablet Take 10 mg by mouth daily. Allergies.   Cholecalciferol (VITAMIN D) 50 MCG (2000 UT) CAPS Take 2,000 Units by mouth.   clopidogrel (PLAVIX) 75 MG tablet TAKE 1 TABLET BY MOUTH ONCE A DAY   cyclobenzaprine  (FLEXERIL) 10 MG tablet TAKE 1 TABLET BY MOUTH 3 TIMES A DAY AS NEEDED FOR MUSCLE SPASMS.   Dextromethorphan-guaiFENesin (CORICIDIN HBP CONGESTION/COUGH) 10-200 MG CAPS Take by mouth.   EPINEPHrine 0.3 mg/0.3 mL IJ SOAJ injection Inject 1 mg into the skin as needed.   furosemide (LASIX) 20 MG tablet Take 20 mg by mouth every other day.   loperamide (IMODIUM A-D) 2 MG tablet Take 2 mg by mouth 2 (two) times daily as needed for diarrhea or loose stools.   losartan (COZAAR) 50 MG tablet Take 1 tablet (50 mg total) by mouth daily.   montelukast (SINGULAIR) 10 MG tablet Take 1 tablet (10 mg total) by mouth at bedtime.   Multiple Vitamins-Minerals (CENTRUM ADULTS PO) Take 1 tablet by mouth.   pantoprazole (PROTONIX) 40 MG tablet Take 1 tablet (40 mg total) by mouth 2 (two) times daily.   vitamin B-12 (CYANOCOBALAMIN) 1000 MCG tablet Take 1,000 mcg by mouth daily.   No facility-administered encounter medications on file as of 08/27/2022.    Social History: Social History   Tobacco Use   Smoking status: Every Day    Packs/day: 1.00    Years: 39.00    Total pack years: 39.00    Types: Cigarettes   Smokeless tobacco: Never   Tobacco comments:    6-8 cigs daily-05/28/2022  Substance Use Topics   Alcohol use: No   Drug use: No    Family Medical History: Family History  Problem Relation Age of Onset   Hypertension Mother    Hyperlipidemia Mother    Diabetes Mother    Colon cancer Father    Pancreatic cancer Father    Cancer Father        Colon & Pancreatic   Breast cancer Paternal Grandmother 1   Diabetes Paternal Grandmother    Coronary artery disease Paternal Grandmother    Heart failure Paternal Grandmother    Cancer Paternal Grandmother        breast   Stroke Brother     Exam: Patient has *** gait.   *** ROM of spine with *** pain. *** tenderness in *** lumbar spine.   Strength in the lower extremities:  Left EHL 5/5   Right EHL 5/5 Left Dorsiflexion 5/5  Right  Dorsiflexion 5/5 Left Plantar flexion 5/5 Right Plantar flexion 5/5 Left Hamstring 5/5  Right Hamstring 5/5 Left Quadriceps 5/5  Right Quadriceps 5/5  Reflexes are ***+ and symmetric at the patella and achilles.    Bilateral lower extremity sensation is intact to light touch.   Clonus is {positive_negative:[id number]}.     Imaging: Lumbar MRI dated 08/18/22:  FINDINGS: Segmentation: Standard; the lowest formed disc space is designated L5-S1.   Alignment: There is levocurvature centered at L3. There is no antero or retrolisthesis.  Vertebrae: Vertebral body heights are preserved. Background marrow signal is normal. There is no suspicious marrow signal abnormality or marrow edema. There are postsurgical changes reflecting L4 through S1 posterior instrumented fusion and decompression.   Conus medullaris and cauda equina: Conus extends to the L1-L2 level. Conus and cauda equina appear normal.   Paraspinal and other soft tissues: A simple right upper pole renal cyst is noted for which no specific imaging follow-up is required. There is a T2 hyperintense fluid collection posterior to the L5 level measuring 4.3 cm TV x 2.0 cm AP x 3.5 cm cc, also present in 2014 and likely reflecting a chronic postoperative seroma. The paraspinal soft tissues are otherwise unremarkable.   Disc levels:   There is disc desiccation and narrowing at L3-L4 which has progressed since 2014. There is mild disc desiccation at the other nonsurgical levels.   T12-L1: No significant spinal canal or neural foraminal stenosis   L1-L2: Mild facet arthropathy without significant spinal canal or neural foraminal stenosis   L2-L3: There is a prominent right subarticular zone protrusion and left worse than right facet arthropathy resulting in mild narrowing of the right subarticular zone without evidence of impingement and no significant neural foraminal stenosis. The protrusion is new since 2014.   L3-L4: There  is a prominent central protrusion and moderate bilateral facet arthropathy with ligamentum flavum thickening resulting in severe spinal canal stenosis with compression of the cauda equina nerve roots and mild left neural foraminal stenosis. This has significantly progressed since 2014.   L4-L5: Status post posterior instrumented fusion and decompression without significant spinal canal or neural foraminal stenosis   L5-S1: Status post posterior instrumented fusion and decompression without significant spinal canal or neural foraminal stenosis.   IMPRESSION: 1. Status post posterior instrumented fusion and decompression from L4 through S1 without residual spinal canal or neural foraminal stenosis. 2. Adjacent segment disease at L3-L4 has significantly progressed since 2014 with a prominent central protrusion and moderate facet arthropathy with ligamentum flavum thickening resulting in severe spinal canal stenosis with compression of the cauda equina nerve roots and mild left neural foraminal stenosis. 3. Right subarticular zone protrusion at L2-L3 without evidence of impingement. 4. Levocurvature centered at L3.     Electronically Signed   By: Valetta Mole M.D.   On: 08/19/2022 12:36    I have personally reviewed the images and agree with the above interpretation.  Assessment and Plan: Ms. Dales is a pleasant 66 y.o. female with ***  Above treatment options discussed with patient and following plan made:   - Order for physical therapy for lumbar spine ***. - Continue on current medications including ***. Reviewed proper dosing along with risks and benefits. Take and NSAIDs with food.  - Conservative therapy has been ineffective at relieving the symptoms. The patient would likely benefit from surgical intervention.  We discussed **** along with the details of the procedure and the associated risks and benefits. Patient agrees to proceed with above surgery. Given the patients  comorbidities, *he will need *** as well as a PAT appt.    I spent a total of *** minutes in both face-to-face and non-face-to-face activities for this visit on the date of this encounter including ***  Geronimo Boot PA-C Neurosurgery

## 2022-08-27 ENCOUNTER — Ambulatory Visit: Payer: Medicare PPO | Admitting: Orthopedic Surgery

## 2022-08-27 ENCOUNTER — Encounter: Payer: Self-pay | Admitting: Orthopedic Surgery

## 2022-08-27 VITALS — BP 130/82 | Ht 65.0 in | Wt 186.2 lb

## 2022-08-27 DIAGNOSIS — M48061 Spinal stenosis, lumbar region without neurogenic claudication: Secondary | ICD-10-CM

## 2022-08-27 DIAGNOSIS — Z981 Arthrodesis status: Secondary | ICD-10-CM | POA: Diagnosis not present

## 2022-08-27 DIAGNOSIS — M5416 Radiculopathy, lumbar region: Secondary | ICD-10-CM | POA: Diagnosis not present

## 2022-08-27 MED ORDER — CYCLOBENZAPRINE HCL 10 MG PO TABS: 10.0000 mg | ORAL_TABLET | Freq: Every evening | ORAL | 0 refills | Status: DC | PRN

## 2022-08-27 NOTE — Patient Instructions (Signed)
It was so nice to see you today.   Your MRI shows wear and tear and spinal stenosis (pressure on spinal cord) above your fusion at L3-L4. I think this is what is causing your pain.   I sent physical therapy orders to [facility] in [place], they should call you to schedule. You can call them at [phone].   I also sent a refill of flexeril to help with muscle spasms. Use only as needed and be careful, this can make you sleepy.   Dr. Izora Ribas will see you back in 6-8 weeks.   Please do not hesitate to call if you have any questions or concerns. You can also message me in [place].   Geronimo Boot PA-C [phone]

## 2022-08-31 DIAGNOSIS — M5126 Other intervertebral disc displacement, lumbar region: Secondary | ICD-10-CM | POA: Diagnosis not present

## 2022-08-31 DIAGNOSIS — J301 Allergic rhinitis due to pollen: Secondary | ICD-10-CM | POA: Diagnosis not present

## 2022-08-31 DIAGNOSIS — M5117 Intervertebral disc disorders with radiculopathy, lumbosacral region: Secondary | ICD-10-CM | POA: Diagnosis not present

## 2022-09-01 ENCOUNTER — Other Ambulatory Visit: Payer: Self-pay | Admitting: Family

## 2022-09-01 DIAGNOSIS — I10 Essential (primary) hypertension: Secondary | ICD-10-CM

## 2022-09-07 DIAGNOSIS — K219 Gastro-esophageal reflux disease without esophagitis: Secondary | ICD-10-CM | POA: Diagnosis not present

## 2022-09-07 DIAGNOSIS — Z800 Family history of malignant neoplasm of digestive organs: Secondary | ICD-10-CM | POA: Diagnosis not present

## 2022-09-07 DIAGNOSIS — R197 Diarrhea, unspecified: Secondary | ICD-10-CM | POA: Diagnosis not present

## 2022-09-07 DIAGNOSIS — J301 Allergic rhinitis due to pollen: Secondary | ICD-10-CM | POA: Diagnosis not present

## 2022-09-08 DIAGNOSIS — M5126 Other intervertebral disc displacement, lumbar region: Secondary | ICD-10-CM | POA: Diagnosis not present

## 2022-09-08 DIAGNOSIS — M5117 Intervertebral disc disorders with radiculopathy, lumbosacral region: Secondary | ICD-10-CM | POA: Diagnosis not present

## 2022-09-10 DIAGNOSIS — M5117 Intervertebral disc disorders with radiculopathy, lumbosacral region: Secondary | ICD-10-CM | POA: Diagnosis not present

## 2022-09-10 DIAGNOSIS — M5126 Other intervertebral disc displacement, lumbar region: Secondary | ICD-10-CM | POA: Diagnosis not present

## 2022-09-14 DIAGNOSIS — J301 Allergic rhinitis due to pollen: Secondary | ICD-10-CM | POA: Diagnosis not present

## 2022-09-14 DIAGNOSIS — M5126 Other intervertebral disc displacement, lumbar region: Secondary | ICD-10-CM | POA: Diagnosis not present

## 2022-09-14 DIAGNOSIS — M5117 Intervertebral disc disorders with radiculopathy, lumbosacral region: Secondary | ICD-10-CM | POA: Diagnosis not present

## 2022-09-16 DIAGNOSIS — M5126 Other intervertebral disc displacement, lumbar region: Secondary | ICD-10-CM | POA: Diagnosis not present

## 2022-09-16 DIAGNOSIS — M5117 Intervertebral disc disorders with radiculopathy, lumbosacral region: Secondary | ICD-10-CM | POA: Diagnosis not present

## 2022-09-21 DIAGNOSIS — J301 Allergic rhinitis due to pollen: Secondary | ICD-10-CM | POA: Diagnosis not present

## 2022-09-22 DIAGNOSIS — M5117 Intervertebral disc disorders with radiculopathy, lumbosacral region: Secondary | ICD-10-CM | POA: Diagnosis not present

## 2022-09-22 DIAGNOSIS — M5126 Other intervertebral disc displacement, lumbar region: Secondary | ICD-10-CM | POA: Diagnosis not present

## 2022-09-23 ENCOUNTER — Telehealth: Payer: Self-pay | Admitting: Orthopedic Surgery

## 2022-09-23 DIAGNOSIS — M25551 Pain in right hip: Secondary | ICD-10-CM

## 2022-09-23 NOTE — Telephone Encounter (Signed)
Note from PT recommending soft tissue imaging of her right hip.   Called and spoke with patient. She continues with constant pain in lateral right hip along with catching. No groin pain.   PT notes scanned into chart.   She has seen [facility] for her hip in the past. Previous right hip xrays looked okay per their notes.   Recommend she follow up with [facility] for her right hip pain. Referral placed. Patient is aware.

## 2022-09-24 DIAGNOSIS — M5126 Other intervertebral disc displacement, lumbar region: Secondary | ICD-10-CM | POA: Diagnosis not present

## 2022-09-24 DIAGNOSIS — M5117 Intervertebral disc disorders with radiculopathy, lumbosacral region: Secondary | ICD-10-CM | POA: Diagnosis not present

## 2022-09-28 DIAGNOSIS — J301 Allergic rhinitis due to pollen: Secondary | ICD-10-CM | POA: Diagnosis not present

## 2022-09-29 DIAGNOSIS — M5126 Other intervertebral disc displacement, lumbar region: Secondary | ICD-10-CM | POA: Diagnosis not present

## 2022-09-29 DIAGNOSIS — M5117 Intervertebral disc disorders with radiculopathy, lumbosacral region: Secondary | ICD-10-CM | POA: Diagnosis not present

## 2022-10-01 DIAGNOSIS — M5126 Other intervertebral disc displacement, lumbar region: Secondary | ICD-10-CM | POA: Diagnosis not present

## 2022-10-01 DIAGNOSIS — M5117 Intervertebral disc disorders with radiculopathy, lumbosacral region: Secondary | ICD-10-CM | POA: Diagnosis not present

## 2022-10-06 DIAGNOSIS — M5117 Intervertebral disc disorders with radiculopathy, lumbosacral region: Secondary | ICD-10-CM | POA: Diagnosis not present

## 2022-10-06 DIAGNOSIS — M5126 Other intervertebral disc displacement, lumbar region: Secondary | ICD-10-CM | POA: Diagnosis not present

## 2022-10-07 DIAGNOSIS — H35371 Puckering of macula, right eye: Secondary | ICD-10-CM | POA: Diagnosis not present

## 2022-10-08 DIAGNOSIS — M5126 Other intervertebral disc displacement, lumbar region: Secondary | ICD-10-CM | POA: Diagnosis not present

## 2022-10-08 DIAGNOSIS — M5117 Intervertebral disc disorders with radiculopathy, lumbosacral region: Secondary | ICD-10-CM | POA: Diagnosis not present

## 2022-10-09 ENCOUNTER — Other Ambulatory Visit: Payer: Self-pay | Admitting: Family

## 2022-10-09 DIAGNOSIS — J439 Emphysema, unspecified: Secondary | ICD-10-CM

## 2022-10-09 DIAGNOSIS — M7061 Trochanteric bursitis, right hip: Secondary | ICD-10-CM | POA: Diagnosis not present

## 2022-10-12 DIAGNOSIS — M5117 Intervertebral disc disorders with radiculopathy, lumbosacral region: Secondary | ICD-10-CM | POA: Diagnosis not present

## 2022-10-12 DIAGNOSIS — M5126 Other intervertebral disc displacement, lumbar region: Secondary | ICD-10-CM | POA: Diagnosis not present

## 2022-10-12 DIAGNOSIS — J301 Allergic rhinitis due to pollen: Secondary | ICD-10-CM | POA: Diagnosis not present

## 2022-10-14 DIAGNOSIS — M5117 Intervertebral disc disorders with radiculopathy, lumbosacral region: Secondary | ICD-10-CM | POA: Diagnosis not present

## 2022-10-14 DIAGNOSIS — M5126 Other intervertebral disc displacement, lumbar region: Secondary | ICD-10-CM | POA: Diagnosis not present

## 2022-10-14 NOTE — Progress Notes (Unsigned)
Referring Physician:  Geronimo Boot, PA-C [address] [place],  [place] [address]  Primary Physician:  Eugenia Pancoast, FNP  History of Present Illness: 10/15/2022 [name] is here today with a chief complaint of low back pain that radiates into her right hip. She saw [facility] for hip and on 10/09/22 was put on a medrol dosepak which has not seemed to help.  She has been having trouble for approximately 2 months.  She reports sharp and throbbing pain into her right hip.  She has pain around her right knee when she stands or bends.  Walking makes it worse.  Sitting and resting makes it better.  Her pain has been worsening over time and is now interfering with her day-to-day life.   Bowel/Bladder Dysfunction: none  Conservative measures:  Physical therapy: currently participating in at [facility]  Multimodal medical therapy including regular antiinflammatories: prednisone, flexeril, tylenol  Injections: no recent epidural steroid injections  Past Surgery:  Cervical Fusion by Dr. Joya Salm Multiple lumbar surgeries with the last one 12 years ago by Dr. Steffanie Dunn has no symptoms of cervical myelopathy.  The symptoms are causing a significant impact on the patient's life.   Progress Note from Geronimo Boot, [place] on 08/27/22:   History of Present Illness: Heather Beasley is a 66 y.o. female who presents to review her lumbar MRI.    History of cervical and lumbar fusion. Has chronic left foot drop. Fusion at L4-S1 looked good on previous xrays. She has known adjacent level spondylosis at L3-L4.    She continues with constant LBP with right hip pain into her groin. She also has pain in anterior thigh, not below her knee. She notes numbness/tingling in right leg (entire leg). Right leg from knee down has been feeling more numb at times. She has intermittent spasms at night and is taking flexeril. Needs a refill. Previous ESIs made her pain worse. No  recent PT.  I have utilized the care everywhere function in epic to review the outside records available from external health systems.  Review of Systems:  A 10 point review of systems is negative, except for the pertinent positives and negatives detailed in the HPI.  Past Medical History: Past Medical History:  Diagnosis Date   Allergic rhinitis    Arthritis    Asthma    Cancer of nasal cavity and sinus ([place]) 07/07/2015   radiation   Cervical spondylosis    Colitis    Colon polyps    Depression    Diverticulosis    GERD (gastroesophageal reflux disease)    Glaucoma    Headaches, cluster    HTN (hypertension)    Hyperlipidemia    IBS (irritable bowel syndrome)    Kidney stones    Melanoma ([place])    above the L knee    Neuropathy    PONV (postoperative nausea and vomiting)    Tobacco abuse     Past Surgical History: Past Surgical History:  Procedure Laterality Date   [place]     CAROTID PTA/STENT INTERVENTION Left 06/27/2020   Procedure: CAROTID PTA/STENT INTERVENTION;  Surgeon: Algernon Huxley, MD;  Location: [facility];  Service: Cardiovascular;  Laterality: Left;   CERVICAL FUSION  2010   CHOLECYSTECTOMY  08/2011   Dr Rochel Brome   COLONOSCOPY  Dr Tiffany Kocher   CYSTOSCOPY     Dr. Bernardo Heater   DILATION AND CURETTAGE OF UTERUS     ESOPHAGOGASTRODUODENOSCOPY  2012   Dr Tiffany Kocher   FRACTURE SURGERY  2001   plate in right wrist and arm   IMAGE GUIDED SINUS SURGERY N/A 06/25/2015   Procedure: IMAGE GUIDED SINUS SURGERY;  Surgeon: Beverly Gust, MD;  Location: [facility];  Service: ENT;  Laterality: N/A;   LUMBAR SPINE SURGERY  07/2004   OOPHORECTOMY     POLYPECTOMY N/A 06/25/2015   Procedure: POLYPECTOMY NASAL;  Surgeon: Beverly Gust, MD;  Location: [facility];  Service: ENT;  Laterality: N/A;   [place]     x2    Allergies: Allergies as of 10/15/2022  - Review Complete 10/15/2022  Allergen Reaction Noted   Albumin (human) Rash 02/26/2015   Amoxicillin-pot clavulanate Hives 02/12/2012   Gabapentin Shortness Of Breath 02/13/2014   Latex Dermatitis and Rash 05/13/2011   Betadine [povidone iodine] Dermatitis, Rash, and Other (See Comments) 05/13/2011   Sucralfate  02/26/2015   Amoxicillin  09/14/2007   Chantix [varenicline tartrate]  01/04/2012   Erythromycin  09/14/2007   Sucralfate Nausea And Vomiting 01/04/2012    Medications: Current Meds  Medication Sig   albuterol (VENTOLIN HFA) 108 (90 Base) MCG/ACT inhaler Inhale into the lungs every 6 (six) hours as needed.   atorvastatin (LIPITOR) 20 MG tablet TAKE 1 TABLET BY MOUTH ONCE A DAY   Azelastine HCl 137 MCG/SPRAY SOLN Place into both nostrils.   Budeson-Glycopyrrol-Formoterol (BREZTRI AEROSPHERE) 160-9-4.8 MCG/ACT AERO Inhale 2 puffs into the lungs in the morning and at bedtime.   cetirizine (ZYRTEC) 10 MG tablet Take 10 mg by mouth daily. Allergies.   Cholecalciferol (VITAMIN D) 50 MCG (2000 UT) CAPS Take 2,000 Units by mouth.   clopidogrel (PLAVIX) 75 MG tablet TAKE 1 TABLET BY MOUTH ONCE A DAY   cyclobenzaprine (FLEXERIL) 10 MG tablet Take 1 tablet (10 mg total) by mouth at bedtime as needed for muscle spasms.   Dextromethorphan-guaiFENesin (CORICIDIN HBP CONGESTION/COUGH) 10-200 MG CAPS Take by mouth.   EPINEPHrine 0.3 mg/0.3 mL IJ SOAJ injection Inject 1 mg into the skin as needed.   furosemide (LASIX) 20 MG tablet Take 20 mg by mouth every other day.   loperamide (IMODIUM A-D) 2 MG tablet Take 2 mg by mouth 2 (two) times daily as needed for diarrhea or loose stools.   losartan (COZAAR) 50 MG tablet TAKE 1 TABLET BY MOUTH ONCE A DAY   montelukast (SINGULAIR) 10 MG tablet TAKE 1 TABLET BY MOUTH AT BEDTIME   Multiple Vitamins-Minerals (CENTRUM ADULTS PO) Take 1 tablet by mouth.   pantoprazole (PROTONIX) 40 MG tablet Take 1 tablet (40 mg total) by mouth 2 (two) times daily.    vitamin B-12 (CYANOCOBALAMIN) 1000 MCG tablet Take 1,000 mcg by mouth daily.    Social History: Social History   Tobacco Use   Smoking status: Every Day    Packs/day: 1.00    Years: 39.00    Total pack years: 39.00    Types: Cigarettes   Smokeless tobacco: Never   Tobacco comments:    6-8 cigs daily-05/28/2022  Substance Use Topics   Alcohol use: No   Drug use: No    Family Medical History: Family History  Problem Relation Age of Onset   Hypertension Mother    Hyperlipidemia Mother    Diabetes Mother    Colon cancer Father    Pancreatic cancer Father  Cancer Father        Colon & Pancreatic   Breast cancer Paternal Grandmother 42   Diabetes Paternal Grandmother    Coronary artery disease Paternal Grandmother    Heart failure Paternal Grandmother    Cancer Paternal Grandmother        breast   Stroke Brother     Physical Examination: Vitals:   10/15/22 0907  BP: (!) 146/84    General: Patient is well developed, well nourished, calm, collected, and in no apparent distress. Attention to examination is appropriate.  Neck:   Supple.  Full range of motion.  Respiratory: Patient is breathing without any difficulty.   NEUROLOGICAL:     Awake, alert, oriented to person, place, and time.  Speech is clear and fluent. Fund of knowledge is appropriate.   Cranial Nerves: Pupils equal round and reactive to light.  Facial tone is symmetric.  Facial sensation is symmetric. Shoulder shrug is symmetric. Tongue protrusion is midline.  There is no pronator drift.  ROM of spine: full.    Strength: Side Biceps Triceps Deltoid Interossei Grip Wrist Ext. Wrist Flex.  R '5 5 5 5 5 5 5  '$ L '5 5 5 5 5 5 5   '$ Side Iliopsoas Quads Hamstring PF DF EHL  R '5 5 5 5 5 5  '$ L '5 5 5 5 5 5   '$ Reflexes are 1+ and symmetric at the biceps, triceps, brachioradialis, patella and achilles.   Hoffman's is absent.   Bilateral upper and lower extremity sensation is intact to light touch.    No  evidence of dysmetria noted.  Gait is normal.     Medical Decision Making  Imaging: MRI L spine 08/18/2022 IMPRESSION: 1. Status post posterior instrumented fusion and decompression from L4 through S1 without residual spinal canal or neural foraminal stenosis. 2. Adjacent segment disease at L3-L4 has significantly progressed since 2014 with a prominent central protrusion and moderate facet arthropathy with ligamentum flavum thickening resulting in severe spinal canal stenosis with compression of the cauda equina nerve roots and mild left neural foraminal stenosis. 3. Right subarticular zone protrusion at L2-L3 without evidence of impingement. 4. Levocurvature centered at L3.     Electronically Signed   By: Valetta Mole M.D.   On: 08/19/2022 12:36  I have personally reviewed the images and agree with the above interpretation.  Assessment and Plan: Ms. Jian is a pleasant 66 y.o. female with adjacent segment disease at L3-4.  She has moderate to severe stenosis at this level.  I think she is symptomatic from this.  I will send her for an injection.  She is on Plavix but can hold this for the injection.  She will continue physical therapy for now.  I think she will ultimately need surgical intervention with extension of her fusion.  Due to the retrolisthesis of L3-4, I would normally supplement the anterior column with the lateral lumbar interbody fusion followed by extension of her instrumentation and fusion posteriorly.  She will need to discontinue using nicotine containing products to be able to move forward with this.  She will need to be off cigarettes for approximately 30 days before we will consider moving forward.  She will try to quit smoking.  I will see her back in approximately 6 weeks to see how she is doing from her injection and checkup on her nicotine utilization.  I spent a total of 30 minutes in this patient's care today. This time was spent reviewing  pertinent records including imaging studies, obtaining and confirming history, performing a directed evaluation, formulating and discussing my recommendations, and documenting the visit within the medical record.    Thank you for involving me in the care of this patient.      Prudence Heiny K. Izora Ribas MD, [facility]

## 2022-10-15 ENCOUNTER — Encounter: Payer: Self-pay | Admitting: Neurosurgery

## 2022-10-15 ENCOUNTER — Ambulatory Visit (INDEPENDENT_AMBULATORY_CARE_PROVIDER_SITE_OTHER): Payer: Medicare PPO | Admitting: Neurosurgery

## 2022-10-15 VITALS — BP 146/84 | Ht 65.0 in | Wt 183.6 lb

## 2022-10-15 DIAGNOSIS — M5136 Other intervertebral disc degeneration, lumbar region: Secondary | ICD-10-CM | POA: Diagnosis not present

## 2022-10-15 DIAGNOSIS — M48061 Spinal stenosis, lumbar region without neurogenic claudication: Secondary | ICD-10-CM | POA: Diagnosis not present

## 2022-10-15 DIAGNOSIS — M5441 Lumbago with sciatica, right side: Secondary | ICD-10-CM

## 2022-10-15 DIAGNOSIS — M4316 Spondylolisthesis, lumbar region: Secondary | ICD-10-CM

## 2022-10-15 DIAGNOSIS — M5416 Radiculopathy, lumbar region: Secondary | ICD-10-CM | POA: Diagnosis not present

## 2022-10-15 DIAGNOSIS — G8929 Other chronic pain: Secondary | ICD-10-CM | POA: Diagnosis not present

## 2022-10-15 DIAGNOSIS — Z981 Arthrodesis status: Secondary | ICD-10-CM | POA: Diagnosis not present

## 2022-10-15 MED ORDER — CYCLOBENZAPRINE HCL 10 MG PO TABS: 10.0000 mg | ORAL_TABLET | Freq: Three times a day (TID) | ORAL | 0 refills | Status: DC | PRN

## 2022-10-15 NOTE — Addendum Note (Signed)
Addended by: Meade Maw on: 10/15/2022 10:19 AM   Modules accepted: Orders

## 2022-10-16 DIAGNOSIS — J301 Allergic rhinitis due to pollen: Secondary | ICD-10-CM | POA: Diagnosis not present

## 2022-10-29 DIAGNOSIS — M5126 Other intervertebral disc displacement, lumbar region: Secondary | ICD-10-CM | POA: Diagnosis not present

## 2022-10-29 DIAGNOSIS — M5117 Intervertebral disc disorders with radiculopathy, lumbosacral region: Secondary | ICD-10-CM | POA: Diagnosis not present

## 2022-11-02 DIAGNOSIS — J301 Allergic rhinitis due to pollen: Secondary | ICD-10-CM | POA: Diagnosis not present

## 2022-11-05 DIAGNOSIS — M5126 Other intervertebral disc displacement, lumbar region: Secondary | ICD-10-CM | POA: Diagnosis not present

## 2022-11-05 DIAGNOSIS — M5117 Intervertebral disc disorders with radiculopathy, lumbosacral region: Secondary | ICD-10-CM | POA: Diagnosis not present

## 2022-11-10 ENCOUNTER — Other Ambulatory Visit: Payer: Self-pay | Admitting: Nurse Practitioner

## 2022-11-10 DIAGNOSIS — J439 Emphysema, unspecified: Secondary | ICD-10-CM

## 2022-11-11 ENCOUNTER — Ambulatory Visit
Payer: Medicare PPO | Attending: Student in an Organized Health Care Education/Training Program | Admitting: Student in an Organized Health Care Education/Training Program

## 2022-11-11 ENCOUNTER — Encounter: Payer: Self-pay | Admitting: Student in an Organized Health Care Education/Training Program

## 2022-11-11 VITALS — BP 145/86 | HR 94 | Temp 97.4°F | Resp 16 | Ht 65.0 in | Wt 183.0 lb

## 2022-11-11 DIAGNOSIS — G894 Chronic pain syndrome: Secondary | ICD-10-CM | POA: Diagnosis not present

## 2022-11-11 DIAGNOSIS — G8929 Other chronic pain: Secondary | ICD-10-CM | POA: Diagnosis not present

## 2022-11-11 DIAGNOSIS — M533 Sacrococcygeal disorders, not elsewhere classified: Secondary | ICD-10-CM

## 2022-11-11 DIAGNOSIS — M5416 Radiculopathy, lumbar region: Secondary | ICD-10-CM

## 2022-11-11 DIAGNOSIS — M5136 Other intervertebral disc degeneration, lumbar region: Secondary | ICD-10-CM | POA: Diagnosis not present

## 2022-11-11 DIAGNOSIS — M5117 Intervertebral disc disorders with radiculopathy, lumbosacral region: Secondary | ICD-10-CM | POA: Diagnosis not present

## 2022-11-11 DIAGNOSIS — M961 Postlaminectomy syndrome, not elsewhere classified: Secondary | ICD-10-CM

## 2022-11-11 DIAGNOSIS — M48061 Spinal stenosis, lumbar region without neurogenic claudication: Secondary | ICD-10-CM | POA: Diagnosis not present

## 2022-11-11 DIAGNOSIS — M5126 Other intervertebral disc displacement, lumbar region: Secondary | ICD-10-CM | POA: Diagnosis not present

## 2022-11-11 NOTE — Progress Notes (Signed)
Patient: Heather Beasley  Service Category: E/M  Provider: Gillis Santa, MD  DOB: [birth year]  DOS: 11/11/2022  Referring Provider: Meade Maw, MD  MRN: [id number]  Setting: Ambulatory outpatient  PCP: Eugenia Pancoast, FNP  Type: New Patient  Specialty: Interventional Pain Management    Location: Office  Delivery: Face-to-face     Primary Reason(s) for Visit: Encounter for initial evaluation of one or more chronic problems (new to examiner) potentially causing chronic pain, and posing a threat to normal musculoskeletal function. (Level of risk: High) CC: Back Pain ("Low back pain" on the right more so in the buttocks as the result of a fall approx 3 months ago ) and Knee Pain (Right )  HPI  Heather Beasley is a 67 y.o. year old, female patient, who comes for the first time to our practice referred by Meade Maw, MD for our initial evaluation of her chronic pain. She has Vitamin D deficiency; Chronic headaches; GERD (gastroesophageal reflux disease); Allergic rhinitis; Essential hypertension, benign; Mixed urge and stress incontinence; Cancer of nasal cavity and sinus ([place]); HLD (hyperlipidemia); Irritable bowel syndrome with diarrhea; Osteopenia; Primary osteoarthritis involving multiple joints; Carotid stenosis, left; Family history of breast cancer in female; DOE (dyspnea on exertion); Elevated brain natriuretic peptide (BNP) level; Pulmonary emphysema ([place]); Pulmonary nodule less than 6 mm determined by computed tomography of lung; Other fatigue; Snoring; Vitamin B12 deficiency; Pain in limb; Lumbar radiculopathy; History of left foot drop; Accidental fall; Lumbar spinal stenosis due to adjacent segment disease after fusion procedure (L4-S1 fusion, Left L3/4); Lumbar post-laminectomy syndrome; Chronic pain syndrome; and Sacroiliac joint pain on their problem list. Today she comes in for evaluation of her Back Pain ("Low back pain" on the right more so in the buttocks as the result of a fall approx  3 months ago ) and Knee Pain (Right )  Pain Assessment: Location: Lower, Right (right) Back (knee) Radiating: lower back pain into right buttocks and hip around to the groin and down the leg Onset: More than a month ago Duration: Chronic pain Quality: Discomfort, Constant, Pressure, Sharp (deep pain in the hip.) Severity: 6 /10 (subjective, self-reported pain score)  Effect on ADL: when standing for more than 3 - 4 minutes the knee builds up so much pressure but when she steps it go away. Timing: Constant Modifying factors: heat and hot showers. tylenol BP: (!) 145/86  HR: 94  Onset and Duration: Gradual, Date of injury: 09/23, and Present longer than 3 months Cause of pain:  fall Severity: No change since onset, NAS-11 at its worse: 10/10, NAS-11 at its best: 3/10, NAS-11 now: 6/10, and NAS-11 on the average: 6/10 Timing: Morning and Afternoon Aggravating Factors: Bending, Lifiting, Prolonged standing, Squatting, and Walking Alleviating Factors: Hot packs, Lying down, TENS, Warm showers or baths, and physical therapy  Associated Problems: Night-time cramps, Fatigue, Inability to control bladder (urine), Inability to control bowel, Sweating, and Pain that wakes patient up Quality of Pain: Deep, Dull, Sharp, and Throbbing Previous Examinations or Tests: MRI scan and X-rays Previous Treatments: Physical Therapy and TENS  Patient patient is a 67 year old female who presents with a chief complaint of low back pain with radiation into her right buttock and occasionally her right groin as well as her right lateral thigh stopping proximal to her knee.  She does have a history of L4-S1 lumbar spinal fusion with adjacent segment disease at L3-L4 with associated spondylosis, facet arthropathy and spinal canal stenosis.  She does have chronic left foot drop.  She is being referred here from Dr. Cari Caraway for consideration of a right L3-L4 lumbar epidural steroid injection.  Of note she does have  peripheral arterial disease and is on Plavix 75 mg for a left carotid stent.  She is also on Flexeril for muscle spasms.  She has tried gabapentin and Lyrica in the past with limited response.  She states that her groin pain while intermittent can be severe at times which could suggest SI joint instability and dysfunction.  She has done physical therapy for this and states that it is not helpful.  She is also utilize a TENS unit.   Meds   Current Outpatient Medications:    atorvastatin (LIPITOR) 20 MG tablet, TAKE 1 TABLET BY MOUTH ONCE A DAY, Disp: 90 tablet, Rfl: 3   Budeson-Glycopyrrol-Formoterol (BREZTRI AEROSPHERE) 160-9-4.8 MCG/ACT AERO, Inhale 2 puffs into the lungs in the morning and at bedtime., Disp: 10.7 g, Rfl: 11   cetirizine (ZYRTEC) 10 MG tablet, Take 10 mg by mouth daily. Allergies., Disp: , Rfl:    Cholecalciferol (VITAMIN D) 50 MCG (2000 UT) CAPS, Take 2,000 Units by mouth., Disp: , Rfl:    clopidogrel (PLAVIX) 75 MG tablet, TAKE 1 TABLET BY MOUTH ONCE A DAY, Disp: 90 tablet, Rfl: 3   cyclobenzaprine (FLEXERIL) 10 MG tablet, Take 1 tablet (10 mg total) by mouth 3 (three) times daily as needed for muscle spasms., Disp: 90 tablet, Rfl: 0   Dextromethorphan-guaiFENesin (CORICIDIN HBP CONGESTION/COUGH) 10-200 MG CAPS, Take by mouth., Disp: , Rfl:    EPINEPHrine 0.3 mg/0.3 mL IJ SOAJ injection, Inject 1 mg into the skin as needed., Disp: , Rfl:    losartan (COZAAR) 50 MG tablet, TAKE 1 TABLET BY MOUTH ONCE A DAY, Disp: 90 tablet, Rfl: 1   montelukast (SINGULAIR) 10 MG tablet, TAKE 1 TABLET BY MOUTH AT BEDTIME, Disp: 90 tablet, Rfl: 3   Multiple Vitamins-Minerals (CENTRUM ADULTS PO), Take 1 tablet by mouth., Disp: , Rfl:    pantoprazole (PROTONIX) 40 MG tablet, Take 1 tablet (40 mg total) by mouth 2 (two) times daily., Disp: 60 tablet, Rfl: 11   vitamin B-12 (CYANOCOBALAMIN) 1000 MCG tablet, Take 1,000 mcg by mouth daily., Disp: , Rfl:    albuterol (VENTOLIN HFA) 108 (90 Base) MCG/ACT  inhaler, Inhale into the lungs every 6 (six) hours as needed. (Patient not taking: Reported on 11/11/2022), Disp: , Rfl:    Azelastine HCl 137 MCG/SPRAY SOLN, Place into both nostrils. (Patient not taking: Reported on 11/11/2022), Disp: , Rfl:    furosemide (LASIX) 20 MG tablet, Take 20 mg by mouth every other day. (Patient not taking: Reported on 11/11/2022), Disp: , Rfl:    loperamide (IMODIUM A-D) 2 MG tablet, Take 2 mg by mouth 2 (two) times daily as needed for diarrhea or loose stools. (Patient not taking: Reported on 11/11/2022), Disp: , Rfl:   Imaging Review    Narrative Clinical Data:  67 year old female with neck and back pain.  Comparison: Lumbar MRI 01/27/2006.  MYELOGRAM CERVICAL AND LUMBAR  Technique: Intrathecal contrast was administered by Dr. Leeroy Cha  via lumbar puncture at the L3-L4 level. Following injection of intrathecal Omnipaque contrast, spine imaging in multiple projections was performed using fluoroscopy.  Fluoroscopy Time: 1.7 minutes.  Findings: Adequate intrathecal contrast.  There are small ventral defects on the lumbar thecal sac at L2-L3, L3-L4, and most pronounced at L4-L5.  No definite spinal or lateral recess stenosis occurs.  No spondylolisthesis.  Using gravity, contrast was transmitted into the  cervical spine.  A ventral defect on the cervical thecal sac is identified at C5-C6, associated with some endplate osteophytosis.  No definite cervical spinal stenosis occurs.  No lateral recess stenosis is identified.  IMPRESSION:  1.  Small ventral defects in the lumbar spine without definite spinal or lateral recess stenosis. 2.  Ventral defect in the cervical spine at the C5-C6, without definite spinal or lateral recess stenosis. 3. See post myelogram CT findings below.  CT MYELOGRAPHY CERVICAL SPINE  Technique: CT imaging of the cervical spine was performed after intrathecal contrast administration.  Multiplanar CT image reconstructions  were also generated.  Findings:  Intrathecal/subarachnoid contrast is present. Visualized lung apices are clear. Visualized paraspinal soft tissues are within normal limits.  Visualized mastoid air cells are clear.  Straightening in mild reversal of cervical lordosis. Cervicothoracic junction alignment is within normal limits. Bilateral posterior element alignment is within normal limits.  No acute osseous abnormality identified.  Mild ligamentous hypertrophy posterior to the dens.  Otherwise, Cervicomedullary junction is within normal limits.  C2-C3:  Negative.  C3-C4:  Mild facet and uncovertebral hypertrophy, worse on the left.  Slight disc bulge which effaces the ventral CSF space.  No spinal stenosis.  Mild osseous left C4 neural foraminal stenosis, but with normal contrast opacification of the neural exit foramen.  C4-C5:  Mild facet and moderate uncovertebral hypertrophy. The mild circumferential disc bulge effaces the ventral CSF space.  No spinal stenosis.  Mild to moderate bilateral osseous foraminal stenosis, worse on the right. Still, contrast opacification of the neural foramen appears within normal limits.  C5-C6:  Lobulated, circumferential disc osteophyte complex, eccentric to the left. Moderate bilateral uncovertebral hypertrophy.  There is effacement of the left ventral CSF space and mild flattening of the left ventral hemicord.  Disc material does not appear to involve either neural foramen although there is osseous foraminal stenosis bilaterally which is mild to moderate (slightly worse on the right).  C6-C7:  Negative.  Likely incidental dilatation of the right C7 nerve root sleeve (perineural cyst).  C7-T1:  Negative.  Small left C8 perineural cyst.  T1-T2:  Anterior endplate osteophytosis.  Otherwise negative. Small bilateral perineural cysts.  T2-T3:  Anterior endplate osteophytosis, otherwise negative.  IMPRESSION:  1.  Left paracentral lobulated  disc osteophyte complex at C5-C6 with mild flattening of the left ventral hemicord, but overall no significant spinal stenosis. 2.  Multilevel, multifactorial mild to moderate osseous neural foraminal stenosis including the bilateral C6, bilateral C5, and left C4 neural exit foramen. 3. See post myelogram CT findings below.  CT MYELOGRAPHY LUMBAR SPINE  Technique: CT imaging of the lumbar spine was performed after intrathecal contrast administration.  Multiplanar CT image reconstructions were also generated.  Findings:  Levoconvex lower lumbar scoliosis.  Straightening of lumbar lordosis.  No spondylolisthesis.  No spondylolysis. Visualized sacrum and SI joints are within normal limits. No acute osseous abnormality identified.  Visualized paraspinal soft tissues are within normal limits.  T11-T12:  Negative.  T12-L1:  Small right greater than left perineural cyst, otherwise negative.  L1-L2:  Negative.  L2-L3:  Mild bilateral facet hypertrophy.  The slight circumferential disc bulge.  No stenosis.  L3-L4:  Moderate sized left foraminal disc protrusion contacts and appears to displace the exiting left L3 nerve root (series 109 image 6, series 400 image 35).  Superimposed mild circumferential disc bulge, and mild facet hypertrophy.  No other stenosis identified.  L4-L5:  Sequelae of left hemilaminectomy.  The right eccentric mild to  moderate circumferential disc bulge.  This effaces the left lateral recess at the site of the descending left L5 nerve root. Right lateral recess appears involve to a lesser extent.  No spinal stenosis.  There is mild to moderate left L4 neural foraminal stenosis primarily due to facet and ligamentous hypertrophy.  There is mild to moderate right L4 foraminal stenosis primarily due to disc material.  The extraforaminal right L4 root is inseparable from disc material (series 2 image 73).  L5-S1:  Mild to moderate circumferential disc bulge,  eccentric to the left.  Mild to moderate left L5 foraminal stenosis is related to this and moderate facet hypertrophy.  Also, the left extraforaminal L5 nerve root is inseparable from disc material (series 2 image 86).  There is effacement of the left lateral recess at the site of the left S1 nerve root.  Mild if any right L5 foraminal stenosis occurs mostly due to mild facet hypertrophy.  No spinal stenosis.  S1 neural foramen are normal.  IMPRESSION:  1.  Moderate sized left foraminal disc protrusion appears to displace the exiting left L3 nerve root at L3-L4. 2.  Postoperative changes at L4-L5 with right eccentric circumferential disc bulge effacing the left lateral recess.  Mild to moderate bilateral L4 neural foraminal stenosis, multifactorial. Extraforaminal right L4 nerve root may be contacted by disc. 3.  Left eccentric circumferential disc bulge L5-S1 effaces the left lateral recess.  Mild to moderate left L5 neural foraminal stenosis, multifactorial. 4.  No lumbar spinal stenosis.  Provider: Vicki Mallet, Bonna Gains   MR LUMBAR SPINE WO CONTRAST  Narrative CLINICAL DATA:  Low back pain with multiple prior surgeries most recently in 2012. Fall 1 month ago and mild low back pain and worsening right leg pain since.  EXAM: MRI LUMBAR SPINE WITHOUT CONTRAST  TECHNIQUE: Multiplanar, multisequence MR imaging of the lumbar spine was performed. No intravenous contrast was administered.  COMPARISON:  Lumbar spine MRI most recently 11/18/2012  FINDINGS: Segmentation: Standard; the lowest formed disc space is designated L5-S1.  Alignment: There is levocurvature centered at L3. There is no antero or retrolisthesis.  Vertebrae: Vertebral body heights are preserved. Background marrow signal is normal. There is no suspicious marrow signal abnormality or marrow edema. There are postsurgical changes reflecting L4 through S1 posterior instrumented fusion and  decompression.  Conus medullaris and cauda equina: Conus extends to the L1-L2 level. Conus and cauda equina appear normal.  Paraspinal and other soft tissues: A simple right upper pole renal cyst is noted for which no specific imaging follow-up is required. There is a T2 hyperintense fluid collection posterior to the L5 level measuring 4.3 cm TV x 2.0 cm AP x 3.5 cm cc, also present in 2014 and likely reflecting a chronic postoperative seroma. The paraspinal soft tissues are otherwise unremarkable.  Disc levels:  There is disc desiccation and narrowing at L3-L4 which has progressed since 2014. There is mild disc desiccation at the other nonsurgical levels.  T12-L1: No significant spinal canal or neural foraminal stenosis  L1-L2: Mild facet arthropathy without significant spinal canal or neural foraminal stenosis  L2-L3: There is a prominent right subarticular zone protrusion and left worse than right facet arthropathy resulting in mild narrowing of the right subarticular zone without evidence of impingement and no significant neural foraminal stenosis. The protrusion is new since 2014.  L3-L4: There is a prominent central protrusion and moderate bilateral facet arthropathy with ligamentum flavum thickening resulting in severe spinal canal stenosis with compression of  the cauda equina nerve roots and mild left neural foraminal stenosis. This has significantly progressed since 2014.  L4-L5: Status post posterior instrumented fusion and decompression without significant spinal canal or neural foraminal stenosis  L5-S1: Status post posterior instrumented fusion and decompression without significant spinal canal or neural foraminal stenosis.  IMPRESSION: 1. Status post posterior instrumented fusion and decompression from L4 through S1 without residual spinal canal or neural foraminal stenosis. 2. Adjacent segment disease at L3-L4 has significantly progressed since 2014 with a  prominent central protrusion and moderate facet arthropathy with ligamentum flavum thickening resulting in severe spinal canal stenosis with compression of the cauda equina nerve roots and mild left neural foraminal stenosis. 3. Right subarticular zone protrusion at L2-L3 without evidence of impingement. 4. Levocurvature centered at L3.   Electronically Signed By: Valetta Mole M.D. On: 08/19/2022 12:36  CT Lumbar Spine W Contrast  Addendum 05/13/2011  1:38 PM CREATED: 05/13/2011 13:35:35  The original dictations states betadine was used.  This is incorrect.  Chlorhexidine soap and latex free gloves were used during the procedure.  SIGNED BY: Dereck Ligas, M.D.  Narrative *RADIOLOGY REPORT*  Clinical Data: Recurrent back pain.  Previous decompression.  Prior cervical fusion.  LUMBAR MYELOGRAM  Procedure: After thorough discussion of risks and benefits of the procedure including bleeding, infection, injury to nerves, blood vessels, adjacent structures as well as headache and CSF leak, written and oral informed consent was obtained.   Consent was obtained by Dr.Lamke.  Patient was positioned prone on the fluoroscopy table. Local anesthesia was provided with 1% lidocaine without epinephrine after prepped and draped in the usual sterile fashion. Puncture was performed at L3-L4 using a 3-1/2 inches 22-gauge spinal needle via right paramedian approach.  Using a single pass through the dura, the needle was placed within the thecal sac, with return of clear CSF. 15 mL of Omnipaque-180 was injected into the thecal sac, with normal opacification of the nerve roots and cauda equina consistent with free flow within the subarachnoid space.  Fluoroscopy time: 37 seconds  Findings:  Shallow anterior disc bulges are present at L3-L4, L4- L5.  Grade 1 retrolisthesis of L4-L5 is present associated with degenerative disc disease and collapse of the disc space.  L4-L5 disc protrusion  effaces contrast within the descending left s one nerve root sleeve.  Left L5-S1 facet arthrosis is present.  In the neutral position, alignment is anatomic when standing.  With extension, 3 mm retrolisthesis of L4 on L5 is present.  With flexion, retrolisthesis reduces to anatomic.  Aortoiliac atherosclerosis is noted.  Disc protrusion at L4-L5 changes minimally with flexion and extension. Levoconvex lumbar scoliosis with the apex at L3-L4.  IMPRESSION: 1.  Technically successful lumbar puncture for lumbar myelogram. 2.  L4-L5 disc protrusion which is left eccentric, with impression on the descending left L5 nerve root in the lateral recess. 3.  Slightly mobile retrolisthesis of L4-L5 measuring 3 mm, grade 1, with extension.  CT LUMBAR MYELOGRAM  Technique: Contiguous axial images were obtained through the lumbar spine without infusion. Coronal, sagittal, and disc space reconstructions were obtained of the axial image sets.  Findings: Levoconvex lumbar scoliosis with the apex at L3-L4. Moderate bilateral SI joint degenerative disease with subchondral sclerosis incidentally noted.  Aortic atherosclerosis without aneurysm.  Paraspinal soft tissues otherwise normal.  Spinal cord terminates posterior to the L2 vertebra.  T12-L1:  Mild left facet degeneration.  No stenosis. L1-L2:  Left facet degeneration and vacuum facet.  No stenosis. L2-L3:  Moderate left and mild right facet degeneration and mild ligamentum flavum redundancy.  Minimal disc bulging is right eccentric.  No stenosis. L3-L4:  Circumferential disc bulge.  Moderate left and mild right facet degeneration.  Mild posterior ligamentum flavum thickening. Mild left foraminal stenosis associated with facet disease and bulging disc.  Central canal and lateral recesses patent. L4-L5:  Grade 1 retrolisthesis, trace at the time of CT scanning. There is a moderate-sized disc extrusion with cranial and caudal extension extending  from the right paracentral to the left foraminal region with mass effect on the central canal.  Central stenosis is mild.  Left lateral recess stenosis potentially affecting the descending left L5 nerve root.  Previous Left L5 laminotomy.  Moderate to severe left L4-L5 foraminal stenosis is multifactorial, associated with retrolisthesis, facet arthritis but predominately due to disc extrusion. L5-S1:  Moderate to severe left facet arthrosis.  Moderate left greater than right foraminal stenosis is multifactorial, associated with degenerated and left eccentric bulging disc.  Near effacement of fat within the left neural foramen.  Mild right foraminal stenosis.  Central canal and lateral recesses patent.  Minimal right facet degeneration.  IMPRESSION: 1.  Postoperative changes of left L4 laminotomy with recurrent disc extrusion at L4-L5 extending from right paracentral to left foraminal region, with mild cranial and caudal extension of disc material.  Left lateral recess stenosis potentially affecting descending left L5 nerve.  Mild central stenosis and left greater than right foraminal stenosis.  Foraminal stenosis is multifactorial, associated with disc, facet disease and retrolisthesis. 2.  L5-S1 moderate to severe left facet arthrosis with left greater than right moderate foraminal stenosis. 3.  Mild degenerative disease L3-L4 and superior. Original Report Authenticated By: Dereck Ligas, M.D.    Narrative *RADIOLOGY REPORT*  Clinical Data: L4-5 and L5-S1 discectomy.  LUMBAR SPINE - 1 VIEW  Comparison: CT myelogram 05/13/2011  Findings: Surgical instrument overlying the spinal canal at the L4- 5 level.  Second instrument overlying the canal at the L5-S1 level.  IMPRESSION: Surgical localization of L4-5 and L5-S1.  Original Report Authenticated By: Truett Perna, M.D.   Narrative *RADIOLOGY REPORT*  Clinical Data: L4-L5 and L5-S1 discectomy.  LUMBAR SPINE - 2-3  VIEW  Comparison: Intraoperative views same date.  Findings: Spot PA and lateral fluoroscopic images of the lower lumbar spine are submitted.  These demonstrate placement of bilateral pedicle screws at L4, L5 and S1.  The interconnecting rods have not yet been placed.  Interbody spacers at both levels appear satisfactorily positioned.  Wide laminectomy defect is noted.  No complications are identified.  IMPRESSION: Intraoperative views during L4-S1 laminectomy and fusion.  Original Report Authenticated By: Vivia Ewing, M.D.  Lumbar DG (Complete) 4+V: Results for orders placed during the hospital encounter of 07/17/22  DG Lumbar Spine Complete  Narrative CLINICAL DATA:  Fall on 07/15/2022. Low back pain. History of back fusion surgery.  EXAM: LUMBAR SPINE - COMPLETE 4+ VIEW  COMPARISON:  08/14/2013.  FINDINGS: No fracture, bone lesion or spondylolisthesis.  Mild levoscoliosis, apex at L3.  Previous L4 through S1 posterior lumbar spine fusion, with peak hardware well-seated and positioned and unchanged.  Small endplate osteophytes at L1-L2 and L2-L3, without significant loss of disc height.  Scattered aortic atherosclerotic calcifications.  IMPRESSION: 1. No fracture or acute finding.  No spondylolisthesis. 2. Minor disc degenerative changes at L2-L3 and L3-L4 similar to the prior radiographs. 3. Mild stable levoscoliosis. 4. L4 through S1 posterior fusion hardware well-seated and unchanged.   Electronically  Signed By: Lajean Manes M.D. On: 07/20/2022 13:07   Addendum 05/13/2011  1:38 PM CREATED: 05/13/2011 13:35:35  The original dictations states betadine was used.  This is incorrect.  Chlorhexidine soap and latex free gloves were used during the procedure.   SIGNED BY: Dereck Ligas, M.D.  Narrative *RADIOLOGY REPORT*  Clinical Data: Recurrent back pain.  Previous decompression.  Prior cervical fusion.  LUMBAR MYELOGRAM  Procedure: After  thorough discussion of risks and benefits of the procedure including bleeding, infection, injury to nerves, blood vessels, adjacent structures as well as headache and CSF leak, written and oral informed consent was obtained.   Consent was obtained by Dr.Lamke.  Patient was positioned prone on the fluoroscopy table. Local anesthesia was provided with 1% lidocaine without epinephrine after prepped and draped in the usual sterile fashion. Puncture was performed at L3-L4 using a 3-1/2 inches 22-gauge spinal needle via right paramedian approach.  Using a single pass through the dura, the needle was placed within the thecal sac, with return of clear CSF. 15 mL of Omnipaque-180 was injected into the thecal sac, with normal opacification of the nerve roots and cauda equina consistent with free flow within the subarachnoid space.  Fluoroscopy time: 37 seconds  Findings:  Shallow anterior disc bulges are present at L3-L4, L4- L5.  Grade 1 retrolisthesis of L4-L5 is present associated with degenerative disc disease and collapse of the disc space.  L4-L5 disc protrusion effaces contrast within the descending left s one nerve root sleeve.  Left L5-S1 facet arthrosis is present.  In the neutral position, alignment is anatomic when standing.  With extension, 3 mm retrolisthesis of L4 on L5 is present.  With flexion, retrolisthesis reduces to anatomic.  Aortoiliac atherosclerosis is noted.  Disc protrusion at L4-L5 changes minimally with flexion and extension. Levoconvex lumbar scoliosis with the apex at L3-L4.  IMPRESSION: 1.  Technically successful lumbar puncture for lumbar myelogram. 2.  L4-L5 disc protrusion which is left eccentric, with impression on the descending left L5 nerve root in the lateral recess. 3.  Slightly mobile retrolisthesis of L4-L5 measuring 3 mm, grade 1, with extension.  CT LUMBAR MYELOGRAM  Technique: Contiguous axial images were obtained through the lumbar spine  without infusion. Coronal, sagittal, and disc space reconstructions were obtained of the axial image sets.  Findings: Levoconvex lumbar scoliosis with the apex at L3-L4. Moderate bilateral SI joint degenerative disease with subchondral sclerosis incidentally noted.  Aortic atherosclerosis without aneurysm.  Paraspinal soft tissues otherwise normal.  Spinal cord terminates posterior to the L2 vertebra.  T12-L1:  Mild left facet degeneration.  No stenosis. L1-L2:  Left facet degeneration and vacuum facet.  No stenosis. L2-L3:  Moderate left and mild right facet degeneration and mild ligamentum flavum redundancy.  Minimal disc bulging is right eccentric.  No stenosis. L3-L4:  Circumferential disc bulge.  Moderate left and mild right facet degeneration.  Mild posterior ligamentum flavum thickening. Mild left foraminal stenosis associated with facet disease and bulging disc.  Central canal and lateral recesses patent. L4-L5:  Grade 1 retrolisthesis, trace at the time of CT scanning. There is a moderate-sized disc extrusion with cranial and caudal extension extending from the right paracentral to the left foraminal region with mass effect on the central canal.  Central stenosis is mild.  Left lateral recess stenosis potentially affecting the descending left L5 nerve root.  Previous Left L5 laminotomy.  Moderate to severe left L4-L5 foraminal stenosis is multifactorial, associated with retrolisthesis, facet arthritis but predominately  due to disc extrusion. L5-S1:  Moderate to severe left facet arthrosis.  Moderate left greater than right foraminal stenosis is multifactorial, associated with degenerated and left eccentric bulging disc.  Near effacement of fat within the left neural foramen.  Mild right foraminal stenosis.  Central canal and lateral recesses patent.  Minimal right facet degeneration.  IMPRESSION: 1.  Postoperative changes of left L4 laminotomy with recurrent  disc extrusion at L4-L5 extending from right paracentral to left foraminal region, with mild cranial and caudal extension of disc material.  Left lateral recess stenosis potentially affecting descending left L5 nerve.  Mild central stenosis and left greater than right foraminal stenosis.  Foraminal stenosis is multifactorial, associated with disc, facet disease and retrolisthesis. 2.  L5-S1 moderate to severe left facet arthrosis with left greater than right moderate foraminal stenosis. 3.  Mild degenerative disease L3-L4 and superior. Original Report Authenticated By: Dereck Ligas, M.D.   Narrative Clinical Data: Left radicular pain, probably L5 in distribution. LEFT L5 NERVE ROOT BLOCK AND TRANSFORAMINAL EPIDURAL A posterior oblique approach was taken to the intervertebral foramen on the left at L5 using a curved 22 gauge spinal needle. Injection of Omnipaque 180 outlined the left L5 nerve root and showed good epidural spread. No vascular opacification is seen. 120 mg of Depo-Medrol mixed with 3 cc of 1% Lidocaine were instilled. The procedure was well-tolerated, and the patient was discharged thirty minutes following the injection in good condition.  IMPRESSION Technically successful first injection consisting of a left L5 nerve root block and transforaminal epidural.    Provider: Candis Schatz, Ceasar Lund   Narrative CLINICAL DATA:  Right elbow pain after fall.  EXAM: RIGHT ELBOW - COMPLETE 3+ VIEW  COMPARISON:  None.  FINDINGS: Elbow joint effusion is present. There is a fracture through the radial neck. This appears nondisplaced. No dislocation.  IMPRESSION: 1. Nondisplaced radial neck fracture. 2. Joint effusion.   Electronically Signed By: Kerby Moors M.D. On: 09/14/2015 17:39  Elbow-L DG Complete: No results found for this or any previous visit.   Wrist Imaging: Wrist-R DG Complete: Results for orders placed in visit on 05/02/00  DG Wrist  Complete Right  Narrative FINDINGS CLINICAL DATA:    FELL. RIGHT WRIST, COMPLETE: THERE IS A COMMINUTED FRACTURE OF THE DISTAL RADIUS, WHICH EXTENDS INTO THE JOINT SPACE.  THERE IS MORE IMPACTION THAN ANGULATION.  THE ULNAR STYLOID IS ALSO AVULSED.  CARPAL BONES APPEAR INTACT. IMPRESSION 1.  COMMINUTED DISTAL RADIUS FRACTURE WHICH EXTENDS TO THE JOINT SPACE. 2.  THE ULNAR STYLOID IS ALSO AVULSED.    Complexity Note: Imaging results reviewed.                         ROS  Cardiovascular: High blood pressure, Weak heart (CHF), Heart murmur, and Blood thinners:  Antiplatelet Pulmonary or Respiratory: Wheezing and difficulty taking a deep full breath (Asthma), Difficulty blowing air out (Emphysema), Shortness of breath, Smoking, and Coughing up mucus (Bronchitis) Neurological: Abnormal skin sensations (Peripheral Neuropathy), Curved spine, Incontinence:  Urinary, and No reported neurological signs or symptoms such as seizures, abnormal skin sensations, urinary and/or fecal incontinence, being born with an abnormal open spine and/or a tethered spinal cord Psychological-Psychiatric: No reported psychological or psychiatric signs or symptoms such as difficulty sleeping, anxiety, depression, delusions or hallucinations (schizophrenial), mood swings (bipolar disorders) or suicidal ideations or attempts Gastrointestinal: Reflux or heatburn and Alternating episodes iof diarrhea and constipation (IBS-Irritable bowe syndrome) Genitourinary: No reported renal  or genitourinary signs or symptoms such as difficulty voiding or producing urine, peeing blood, non-functioning kidney, kidney stones, difficulty emptying the bladder, difficulty controlling the flow of urine, or chronic kidney disease Hematological: No reported hematological signs or symptoms such as prolonged bleeding, low or poor functioning platelets, bruising or bleeding easily, hereditary bleeding problems, low energy levels due to low hemoglobin  or being anemic Endocrine: No reported endocrine signs or symptoms such as high or low blood sugar, rapid heart rate due to high thyroid levels, obesity or weight gain due to slow thyroid or thyroid disease Rheumatologic: Joint aches and or swelling due to excess weight (Osteoarthritis) Musculoskeletal: Negative for myasthenia gravis, muscular dystrophy, multiple sclerosis or malignant hyperthermia Work History: Retired  Allergies  Ms. Backs is allergic to albumin (human), amoxicillin-pot clavulanate, gabapentin, latex, betadine [povidone iodine], sucralfate, amoxicillin, chantix [varenicline tartrate], erythromycin, and sucralfate.  Laboratory Chemistry Profile   Renal Lab Results  Component Value Date   BUN 19 04/14/2022   CREATININE 1.08 04/14/2022   BCR 19 04/10/2019   GFR 54.54 (L) 04/14/2022   GFRAA >60 06/28/2020   GFRNONAA >60 12/31/2021   SPECGRAV 1.015 04/27/2018   PHUR 6.0 04/27/2018   PROTEINUR Negative 04/27/2018     Electrolytes Lab Results  Component Value Date   NA 143 04/14/2022   K 4.2 04/14/2022   CL 106 04/14/2022   CALCIUM 9.2 04/14/2022     Hepatic Lab Results  Component Value Date   AST 17 04/14/2022   ALT 11 04/14/2022   ALBUMIN 4.1 04/14/2022   ALKPHOS 94 04/14/2022   LIPASE 16 01/04/2012     ID Lab Results  Component Value Date   SARSCOV2NAA NEGATIVE 12/31/2021   STAPHAUREUS NEGATIVE 05/29/2011   MRSAPCR NEGATIVE 06/09/2011     Bone Lab Results  Component Value Date   VD25OH 40.45 04/14/2022     Endocrine Lab Results  Component Value Date   GLUCOSE 85 04/14/2022   GLUCOSEU NEGATIVE 08/30/2015   TSH 3.66 04/14/2022   FREET4 0.77 01/08/2022     Neuropathy Lab Results  Component Value Date   VITAMINB12 451 04/14/2022   FOLATE 6.3 04/14/2022     CNS Lab Results  Component Value Date   COLORCSF PINK (A) 08/04/2013   COLORCSF COLORLESS 08/04/2013   APPEARCSF HAZY (A) 08/04/2013   APPEARCSF CLEAR 08/04/2013   RBCCOUNTCSF  9,300 (H) 08/04/2013   RBCCOUNTCSF 390 (H) 08/04/2013   WBCCSF 0 08/04/2013   WBCCSF 1 08/04/2013   POLYSCSF FEW 08/04/2013   POLYSCSF NONE SEEN 08/04/2013   LYMPHSCSF FEW 08/04/2013   LYMPHSCSF RARE 08/04/2013   EOSCSF NONE SEEN 08/04/2013   EOSCSF NONE SEEN 08/04/2013   PROTEINCSF 14 (L) 08/04/2013   GLUCCSF 66 08/04/2013     Inflammation (CRP: Acute  ESR: Chronic) Lab Results  Component Value Date   ESRSEDRATE 2 11/17/2012   LATICACIDVEN 1.0 06/11/2011     Rheumatology No results found for: "RF", "ANA", "LABURIC", "URICUR", "LYMEIGGIGMAB", "LYMEABIGMQN", "HLAB27"   Coagulation Lab Results  Component Value Date   INR 1.03 08/04/2013   LABPROT 13.3 08/04/2013   APTT 30 08/04/2013   PLT 198.0 07/22/2022   DDIMER 0.63 (H) 12/31/2021     Cardiovascular Lab Results  Component Value Date   BNP 250.8 (H) 12/31/2021   CKTOTAL 50 07/22/2022   CKMB 5.4 (H) 06/10/2011   TROPONINI <0.30 08/04/2013   HGB 13.1 07/22/2022   HCT 39.8 07/22/2022     Screening Lab Results  Component Value  Date   SARSCOV2NAA NEGATIVE 12/31/2021   STAPHAUREUS NEGATIVE 05/29/2011   MRSAPCR NEGATIVE 06/09/2011     Cancer No results found for: "CEA", "CA125", "LABCA2"   Allergens No results found for: "ALMOND", "APPLE", "ASPARAGUS", "AVOCADO", "BANANA", "BARLEY", "BASIL", "BAYLEAF", "GREENBEAN", "LIMABEAN", "WHITEBEAN", "BEEFIGE", "REDBEET", "BLUEBERRY", "BROCCOLI", "CABBAGE", "MELON", "CARROT", "CASEIN", "CASHEWNUT", "CAULIFLOWER", "CELERY"     Note: Lab results reviewed.  [place]  Drug: Ms. Thalmann  reports no history of drug use. Alcohol:  reports no history of alcohol use. Tobacco:  reports that she has been smoking cigarettes. She has a 39.00 pack-year smoking history. She has never used smokeless tobacco. Medical:  has a past medical history of Allergic rhinitis, Arthritis, Asthma, Cancer of nasal cavity and sinus ([place]) (07/07/2015), Cervical spondylosis, Colitis, Colon polyps, Depression,  Diverticulosis, GERD (gastroesophageal reflux disease), Glaucoma, Headaches, cluster, HTN (hypertension), Hyperlipidemia, IBS (irritable bowel syndrome), Kidney stones, Melanoma ([place]), Neuropathy, PONV (postoperative nausea and vomiting), and Tobacco abuse. Family: family history includes Breast cancer (age of onset: 20) in her paternal grandmother; Cancer in her father and paternal grandmother; Colon cancer in her father; Coronary artery disease in her paternal grandmother; Diabetes in her mother and paternal grandmother; Heart failure in her paternal grandmother; Hyperlipidemia in her mother; Hypertension in her mother; Pancreatic cancer in her father; Stroke in her brother.  Past Surgical History:  Procedure Laterality Date   ABDOMINAL HYSTERECTOMY  [place]   ARM [place] REPAIR  2001   BACK SURGERY     CAROTID PTA/STENT INTERVENTION Left 06/27/2020   Procedure: CAROTID PTA/STENT INTERVENTION;  Surgeon: Algernon Huxley, MD;  Location: [facility];  Service: Cardiovascular;  Laterality: Left;   CERVICAL FUSION  2010   CHOLECYSTECTOMY  08/2011   Dr Rochel Brome   COLONOSCOPY     Dr Tiffany Kocher   CYSTOSCOPY     Dr. Bernardo Heater   DILATION AND CURETTAGE OF UTERUS     ESOPHAGOGASTRODUODENOSCOPY  2012   Dr Tiffany Kocher   FRACTURE SURGERY  2001   plate in right wrist and arm   IMAGE GUIDED SINUS SURGERY N/A 06/25/2015   Procedure: IMAGE GUIDED SINUS SURGERY;  Surgeon: Beverly Gust, MD;  Location: [facility];  Service: ENT;  Laterality: N/A;   LUMBAR SPINE SURGERY  07/2004   OOPHORECTOMY     POLYPECTOMY N/A 06/25/2015   Procedure: POLYPECTOMY NASAL;  Surgeon: Beverly Gust, MD;  Location: [facility];  Service: ENT;  Laterality: N/A;   [place]     x2   Active Ambulatory Problems    Diagnosis Date Noted   Vitamin D deficiency 01/04/2012   Chronic headaches 02/22/2012   GERD (gastroesophageal reflux disease) 02/22/2012   Allergic rhinitis 12/13/2012    Essential hypertension, benign 08/08/2013   Mixed urge and stress incontinence 03/27/2013   Cancer of nasal cavity and sinus ([place]) 07/07/2015   HLD (hyperlipidemia) 12/01/2016   Irritable bowel syndrome with diarrhea 12/01/2016   Osteopenia 02/14/2018   Primary osteoarthritis involving multiple joints 03/01/2018   Carotid stenosis, left 06/27/2020   Family history of breast cancer in female 11/17/2021   DOE (dyspnea on exertion) 01/08/2022   Elevated brain natriuretic peptide (BNP) level 01/08/2022   Pulmonary emphysema ([place]) 01/08/2022   Pulmonary nodule less than 6 mm determined by computed tomography of lung 01/08/2022   Other fatigue 01/08/2022   Snoring 01/20/2022   Vitamin B12 deficiency 04/14/2022   Pain in limb 05/19/2022   Lumbar radiculopathy 07/17/2022  History of left foot drop 07/17/2022   Accidental fall 07/17/2022   Lumbar spinal stenosis due to adjacent segment disease after fusion procedure (L4-S1 fusion, Left L3/4) 11/11/2022   Lumbar post-laminectomy syndrome 11/11/2022   Chronic pain syndrome 11/11/2022   Sacroiliac joint pain 11/11/2022   Resolved Ambulatory Problems    Diagnosis Date Noted   TOBACCO USE 01/10/2008   RHINITIS 09/14/2007   Asthma, intermittent, uncomplicated 87/56/4332   CONTACT DERMATITIS&OTHER ECZEMA DUE UNSPEC CAUSE 02/12/2009   SPONDYLOSIS, CERVICAL 09/14/2007   ELEVATED BLOOD PRESSURE WITHOUT DIAGNOSIS OF HYPERTENSION 02/12/2009   Depression 01/04/2012   Hyperlipidemia LDL goal < 100 01/04/2012   Hx of cerebellar ataxia 01/04/2012   Abdominal pain, acute, epigastric 01/04/2012   Angiomyolipoma 02/22/2012   Dysuria 02/22/2012   Abdominal cramping, generalized 05/23/2012   Apnea, sleep 06/24/2012   Daytime somnolence 06/24/2012   Lightheadedness 11/25/2012   Chronic back pain 11/25/2012   Elevated BP 11/25/2012   Anxiety 11/25/2012   Acute maxillary sinusitis 12/13/2012   Routine general medical examination at a health care  facility 06/07/2013   Abnormal urine odor 06/07/2013   Nonspecific abnormal electrocardiogram (ECG) (EKG) 06/07/2013   Postmenopausal estrogen deficiency 06/07/2013   Dyspnea 06/07/2013   Edema leg 07/19/2013   Headache(784.0) 08/08/2013   Anxiety state, unspecified 08/08/2013   Worsening headaches 08/10/2013   Need for prophylactic vaccination and inoculation against influenza 08/10/2013   Abdominal pain, epigastric 11/14/2013   Abnormal EKG 12/04/2013   Palpitations 12/04/2013   Hypertension 12/04/2013   Tick bite 02/13/2014   Encounter for screening mammogram for malignant neoplasm of breast 07/04/2014   Acute ear pain 12/04/2014   TMJ (dislocation of temporomandibular joint) 12/04/2014   Shingles 02/26/2015   Incomplete emptying of bladder 03/27/2013   Inguinal hernia 03/27/2013   Hematuria, microscopic 95/18/8416   Uncertain tumor of kidney and ureter 60/63/0160   Renal colic 10/93/2355   Personal history of tobacco use, presenting hazards to health 10/23/2015   Acute bronchitis 09/28/2016   Xerostomia due to radiation 10/04/2016   COPD (chronic obstructive pulmonary disease) ([place]) 12/01/2016   Environmental allergies 12/01/2016   History of melanoma 12/01/2016   Skin rash 05/28/2017   Xerostomia due to radiotherapy 07/19/2017   Allergic conjunctivitis 02/10/2018   Smoking trying to quit 02/14/2018   Pulmonary nodules 02/14/2018   Urine malodor 04/27/2018   Pain in elbow 08/28/2016   Pelvic floor weakness in female 03/21/2020   Carotid stenosis 06/18/2020   Chronic diarrhea 11/17/2021   Muscle spasm of back 11/17/2021   Asthma exacerbation in COPD ([place]) 12/18/2021   Thrush, oral 12/18/2021   History of COVID-19 12/18/2021   Shortness of breath 01/08/2022   Past Medical History:  Diagnosis Date   Allergic rhinitis    Arthritis    Asthma    Cervical spondylosis    Colitis    Colon polyps    Diverticulosis    Glaucoma    Headaches, cluster    HTN  (hypertension)    Hyperlipidemia    IBS (irritable bowel syndrome)    Kidney stones    Melanoma ([place])    Neuropathy    PONV (postoperative nausea and vomiting)    Tobacco abuse    Constitutional Exam  General appearance: Well nourished, well developed, and well hydrated. In no apparent acute distress Vitals:   11/11/22 1420  BP: (!) 145/86  Pulse: 94  Resp: 16  Temp: (!) 97.4 F (36.3 C)  TempSrc: Temporal  SpO2: 96%  Weight:  183 lb (83 kg)  Height: '5\' 5"'$  (1.651 m)   BMI Assessment: Estimated body mass index is 30.45 kg/m as calculated from the following:   Height as of this encounter: '5\' 5"'$  (1.651 m).   Weight as of this encounter: 183 lb (83 kg).  BMI interpretation table: BMI level Category Range association with higher incidence of chronic pain  <18 kg/m2 Underweight   18.5-24.9 kg/m2 Ideal body weight   25-29.9 kg/m2 Overweight Increased incidence by 20%  30-34.9 kg/m2 Obese (Class I) Increased incidence by 68%  35-39.9 kg/m2 Severe obesity (Class II) Increased incidence by 136%  >40 kg/m2 Extreme obesity (Class III) Increased incidence by 254%   Patient's current BMI Ideal Body weight  Body mass index is 30.45 kg/m. Ideal body weight: 57 kg (125 lb 10.6 oz) Adjusted ideal body weight: 67.4 kg (148 lb 9.6 oz)   BMI Readings from Last 4 Encounters:  11/11/22 30.45 kg/m  10/15/22 30.55 kg/m  08/27/22 30.99 kg/m  08/07/22 30.42 kg/m   Wt Readings from Last 4 Encounters:  11/11/22 183 lb (83 kg)  10/15/22 183 lb 9.6 oz (83.3 kg)  08/27/22 186 lb 3.2 oz (84.5 kg)  08/07/22 182 lb 12.8 oz (82.9 kg)    Psych/Mental status: Alert, oriented x 3 (person, place, & time)       Eyes: PERLA Respiratory: No evidence of acute respiratory distress  Thoracic Spine Area Exam  Skin & Axial Inspection: No masses, redness, or swelling Alignment: Symmetrical Functional ROM: Unrestricted ROM Stability: No instability detected Muscle Tone/Strength: Functionally intact.  No obvious neuro-muscular anomalies detected. Sensory (Neurological): Unimpaired Muscle strength & Tone: No palpable anomalies Lumbar Spine Area Exam  Skin & Axial Inspection: Well healed scar from previous spine surgery detected Alignment: Symmetrical Functional ROM: Pain restricted ROM affecting primarily the right Stability: No instability detected Muscle Tone/Strength: Functionally intact. No obvious neuro-muscular anomalies detected. Sensory (Neurological): Dermatomal pain pattern right L3-L4 Palpation: No palpable anomalies       Provocative Tests: Hyperextension/rotation test: (+) due to pain. Lumbar quadrant test (Kemp's test): (+) on the right for foraminal stenosis Lateral bending test: (+) ipsilateral radicular pain, on the right. Positive for right-sided foraminal stenosis. Patrick's Maneuver: (+) for right-sided S-I arthralgia             FABER* test: (+) for right-sided S-I arthralgia             S-I anterior distraction/compression test: deferred today         S-I lateral compression test: deferred today         S-I Thigh-thrust test: deferred today         S-I Gaenslen's test: deferred today         *(Flexion, ABduction and External Rotation) Gait & Posture Assessment  Ambulation: Unassisted Gait: Relatively normal for age and body habitus Posture: WNL  Lower Extremity Exam    Side: Right lower extremity  Side: Left lower extremity  Stability: No instability observed          Stability: No instability observed          Skin & Extremity Inspection: Skin color, temperature, and hair growth are WNL. No peripheral edema or cyanosis. No masses, redness, swelling, asymmetry, or associated skin lesions. No contractures.  Skin & Extremity Inspection: Skin color, temperature, and hair growth are WNL. No peripheral edema or cyanosis. No masses, redness, swelling, asymmetry, or associated skin lesions. No contractures.  Functional ROM: Pain restricted ROM for hip joint  Functional ROM:  Left foot drop                   Muscle Tone/Strength: Functionally intact. No obvious neuro-muscular anomalies detected.  Muscle Tone/Strength: Functionally intact. No obvious neuro-muscular anomalies detected.  Sensory (Neurological): Dermatomal pain pattern        Sensory (Neurological): Unimpaired        DTR: Patellar: deferred today Achilles: deferred today Plantar: deferred today  DTR: Patellar: deferred today Achilles: deferred today Plantar: deferred today  Palpation: No palpable anomalies  Palpation: No palpable anomalies    Assessment  Primary Diagnosis & Pertinent Problem List: The primary encounter diagnosis was Chronic radicular lumbar pain. Diagnoses of Lumbar spinal stenosis due to adjacent segment disease after fusion procedure (L4-S1 fusion, RIGHT L3/4), Lumbar radiculopathy (RIGHT L3/4), Lumbar post-laminectomy syndrome, Sacroiliac joint pain, and Chronic pain syndrome were also pertinent to this visit.  Visit Diagnosis (New problems to examiner): 1. Chronic radicular lumbar pain   2. Lumbar spinal stenosis due to adjacent segment disease after fusion procedure (L4-S1 fusion, RIGHT L3/4)   3. Lumbar radiculopathy (RIGHT L3/4)   4. Lumbar post-laminectomy syndrome   5. Sacroiliac joint pain   6. Chronic pain syndrome    Plan of Care (Initial workup plan)  Ms. Hilbun is a very pleasant 67 year old female with a history of L4-S1 lumbar spinal fusion who has developed adjacent segment disease at L3-L4, spondylosis at L3-L4 and severe canal stenosis with compression of the cauda equina nerve roots.  Her most recent lumbar spine surgery was in 2012.  She has been working with physical therapy but has not noticed a difference in her functional status or her pain.  She is being referred here for a right L3-L4 lumbar epidural steroid injection.  Risk and benefits of this were reviewed with the patient in detail.  She will need to discontinue her Plavix for 7 days  prior to her epidural.  She can supplement with aspirin 81 mg instead.  We will reach out to her vascular surgery team, Dr. Lucky Cowboy to obtain cardiac clearance for her to stop Plavix for 7 days prior.  Future considerations for her would include right SI joint x-ray and possible right SI joint block as she does have pain overlying her buttock with positive physical exam findings suggestive of SI joint dysfunction. She could also be a candidate for spinal cord stim trial.  We will see how she responds with her epidural first, she does have follow-up with Dr. Cari Caraway next month.  Provider-requested follow-up: Return in about 2 weeks (around 11/25/2022) for Right L3/4 ESI (stop Plavix 7 days prior, ok to take ASA 81 mg instead).  Future Appointments  Date Time Provider [place]  12/10/2022 10:45 AM Meade Maw, MD [facility] None  03/08/2023 12:00 PM [facility] NURSE HEALTH ADVISOR [facility]  03/24/2023  9:15 AM Ralene Bathe, MD [facility] None  05/18/2023  9:30 AM [facility] 3 [facility] None  05/18/2023 10:30 AM Dew, Erskine Squibb, MD [facility] None    Duration of encounter: 79mnutes.  Total time on encounter, as per AMA guidelines included both the face-to-face and non-face-to-face time personally spent by the physician and/or other qualified health care professional(s) on the day of the encounter (includes time in activities that require the physician or other qualified health care professional and does not include time in activities normally performed by clinical staff). Physician's time may include the following activities when performed: Preparing to see the patient (e.g., pre-charting review  of records, searching for previously ordered imaging, lab work, and nerve conduction tests) Review of prior analgesic pharmacotherapies. Reviewing PMP Interpreting ordered tests (e.g., lab work, imaging, nerve conduction tests) Performing post-procedure evaluations, including interpretation of  diagnostic procedures Obtaining and/or reviewing separately obtained history Performing a medically appropriate examination and/or evaluation Counseling and educating the patient/family/caregiver Ordering medications, tests, or procedures Referring and communicating with other health care professionals (when not separately reported) Documenting clinical information in the electronic or other health record Independently interpreting results (not separately reported) and communicating results to the patient/ family/caregiver Care coordination (not separately reported)  Note by: Gillis Santa, MD Date: 11/11/2022; Time: 3:19 PM

## 2022-11-11 NOTE — Patient Instructions (Addendum)
We will need to get cardiac clearance to stop her Plavix for 7 days prior to your scheduled procedure.  Okay to substitute with aspirin 81 mg during that time that you are off Plavix.  We will contact Dr. Golda Acre office to get clearance and it may be helpful for you to do the same  Nothing to eat 8 hours prior to procedure.  Bring a driver    ____________________________________________________________________________________________  General Risks and Possible Complications  Patient Responsibilities: It is important that you read this as it is part of your informed consent. It is our duty to inform you of the risks and possible complications associated with treatments offered to you. It is your responsibility as a patient to read this and to ask questions about anything that is not clear or that you believe was not covered in this document.  Patient's Rights: You have the right to refuse treatment. You also have the right to change your mind, even after initially having agreed to have the treatment done. However, under this last option, if you wait until the last second to change your mind, you may be charged for the materials used up to that point.  Introduction: Medicine is not an Chief Strategy Officer. Everything in Medicine, including the lack of treatment(s), carries the potential for danger, harm, or loss (which is by definition: Risk). In Medicine, a complication is a secondary problem, condition, or disease that can aggravate an already existing one. All treatments carry the risk of possible complications. The fact that a side effects or complications occurs, does not imply that the treatment was conducted incorrectly. It must be clearly understood that these can happen even when everything is done following the highest safety standards.  No treatment: You can choose not to proceed with the proposed treatment alternative. The "PRO(s)" would include: avoiding the risk of complications associated with  the therapy. The "CON(s)" would include: not getting any of the treatment benefits. These benefits fall under one of three categories: diagnostic; therapeutic; and/or palliative. Diagnostic benefits include: getting information which can ultimately lead to improvement of the disease or symptom(s). Therapeutic benefits are those associated with the successful treatment of the disease. Finally, palliative benefits are those related to the decrease of the primary symptoms, without necessarily curing the condition (example: decreasing the pain from a flare-up of a chronic condition, such as incurable terminal cancer).  General Risks and Complications: These are associated to most interventional treatments. They can occur alone, or in combination. They fall under one of the following six (6) categories: no benefit or worsening of symptoms; bleeding; infection; nerve damage; allergic reactions; and/or death. No benefits or worsening of symptoms: In Medicine there are no guarantees, only probabilities. No healthcare provider can ever guarantee that a medical treatment will work, they can only state the probability that it may. Furthermore, there is always the possibility that the condition may worsen, either directly, or indirectly, as a consequence of the treatment. Bleeding: This is more common if the patient is taking a blood thinner, either prescription or over the counter (example: Goody Powders, Fish oil, Aspirin, Garlic, etc.), or if suffering a condition associated with impaired coagulation (example: Hemophilia, cirrhosis of the liver, low platelet counts, etc.). However, even if you do not have one on these, it can still happen. If you have any of these conditions, or take one of these drugs, make sure to notify your treating physician. Infection: This is more common in patients with a compromised immune system, either  due to disease (example: diabetes, cancer, human immunodeficiency virus [HIV], etc.), or  due to medications or treatments (example: therapies used to treat cancer and rheumatological diseases). However, even if you do not have one on these, it can still happen. If you have any of these conditions, or take one of these drugs, make sure to notify your treating physician. Nerve Damage: This is more common when the treatment is an invasive one, but it can also happen with the use of medications, such as those used in the treatment of cancer. The damage can occur to small secondary nerves, or to large primary ones, such as those in the spinal cord and brain. This damage may be temporary or permanent and it may lead to impairments that can range from temporary numbness to permanent paralysis and/or brain death. Allergic Reactions: Any time a substance or material comes in contact with our body, there is the possibility of an allergic reaction. These can range from a mild skin rash (contact dermatitis) to a severe systemic reaction (anaphylactic reaction), which can result in death. Death: In general, any medical intervention can result in death, most of the time due to an unforeseen complication. ____________________________________________________________________________________________   Epidural Steroid Injection Patient Information  Description: The epidural space surrounds the nerves as they exit the spinal cord.  In some patients, the nerves can be compressed and inflamed by a bulging disc or a tight spinal canal (spinal stenosis).  By injecting steroids into the epidural space, we can bring irritated nerves into direct contact with a potentially helpful medication.  These steroids act directly on the irritated nerves and can reduce swelling and inflammation which often leads to decreased pain.  Epidural steroids may be injected anywhere along the spine and from the neck to the low back depending upon the location of your pain.   After numbing the skin with local anesthetic (like Novocaine), a  small needle is passed into the epidural space slowly.  You may experience a sensation of pressure while this is being done.  The entire block usually last less than 10 minutes.  Conditions which may be treated by epidural steroids:  Low back and leg pain Neck and arm pain Spinal stenosis Post-laminectomy syndrome Herpes zoster (shingles) pain Pain from compression fractures  Preparation for the injection:  Do not eat any solid food or dairy products within 8 hours of your appointment.  You may drink clear liquids up to 3 hours before appointment.  Clear liquids include water, black coffee, juice or soda.  No milk or cream please. You may take your regular medication, including pain medications, with a sip of water before your appointment  Diabetics should hold regular insulin (if taken separately) and take 1/2 normal NPH dos the morning of the procedure.  Carry some sugar containing items with you to your appointment. A driver must accompany you and be prepared to drive you home after your procedure.  Bring all your current medications with your. An IV may be inserted and sedation may be given at the discretion of the physician.   A blood pressure cuff, EKG and other monitors will often be applied during the procedure.  Some patients may need to have extra oxygen administered for a short period. You will be asked to provide medical information, including your allergies, prior to the procedure.  We must know immediately if you are taking blood thinners (like Coumadin/Warfarin)  Or if you are allergic to IV iodine contrast (dye). We must know if  you could possible be pregnant.  Possible side-effects: Bleeding from needle site Infection (rare, may require surgery) Nerve injury (rare) Numbness & tingling (temporary) Difficulty urinating (rare, temporary) Spinal headache ( a headache worse with upright posture) Light -headedness (temporary) Pain at injection site (several days) Decreased  blood pressure (temporary) Weakness in arm/leg (temporary) Pressure sensation in back/neck (temporary)  Call if you experience: Fever/chills associated with headache or increased back/neck pain. Headache worsened by an upright position. New onset weakness or numbness of an extremity below the injection site Hives or difficulty breathing (go to the emergency room) Inflammation or drainage at the infection site Severe back/neck pain Any new symptoms which are concerning to you  Please note:  Although the local anesthetic injected can often make your back or neck feel good for several hours after the injection, the pain will likely return.  It takes 3-7 days for steroids to work in the epidural space.  You may not notice any pain relief for at least that one week.  If effective, we will often do a series of three injections spaced 3-6 weeks apart to maximally decrease your pain.  After the initial series, we generally will wait several months before considering a repeat injection of the same type.  If you have any questions, please call [phone] [facility]

## 2022-11-11 NOTE — Progress Notes (Signed)
Safety precautions to be maintained throughout the outpatient stay will include: orient to surroundings, keep bed in low position, maintain call bell within reach at all times, provide assistance with transfer out of bed and ambulation.  

## 2022-11-16 DIAGNOSIS — M5126 Other intervertebral disc displacement, lumbar region: Secondary | ICD-10-CM | POA: Diagnosis not present

## 2022-11-16 DIAGNOSIS — J301 Allergic rhinitis due to pollen: Secondary | ICD-10-CM | POA: Diagnosis not present

## 2022-11-16 DIAGNOSIS — M5117 Intervertebral disc disorders with radiculopathy, lumbosacral region: Secondary | ICD-10-CM | POA: Diagnosis not present

## 2022-11-19 DIAGNOSIS — M5117 Intervertebral disc disorders with radiculopathy, lumbosacral region: Secondary | ICD-10-CM | POA: Diagnosis not present

## 2022-11-19 DIAGNOSIS — M5126 Other intervertebral disc displacement, lumbar region: Secondary | ICD-10-CM | POA: Diagnosis not present

## 2022-11-23 ENCOUNTER — Telehealth: Payer: Self-pay | Admitting: Family

## 2022-11-23 ENCOUNTER — Ambulatory Visit
Admission: EM | Admit: 2022-11-23 | Discharge: 2022-11-23 | Disposition: A | Discharge status: Home / Self Care | Payer: Medicare PPO

## 2022-11-23 DIAGNOSIS — M5126 Other intervertebral disc displacement, lumbar region: Secondary | ICD-10-CM | POA: Diagnosis not present

## 2022-11-23 DIAGNOSIS — I10 Essential (primary) hypertension: Secondary | ICD-10-CM

## 2022-11-23 DIAGNOSIS — M5117 Intervertebral disc disorders with radiculopathy, lumbosacral region: Secondary | ICD-10-CM | POA: Diagnosis not present

## 2022-11-23 NOTE — ED Provider Notes (Signed)
[facility]    CSN: [id number] Arrival date & time carotid duplex at that time shows 11/23/22  1141      History   Chief Complaint Chief Complaint  Patient presents with   Hypertension    HPI Heather Beasley is a 67 y.o. female. Pt presents to uc with c/o of htn. Pt reports she was at PT today and she got a bad headache and felt really dizzy and broke out in a sweat. They took her vitals at PT and her bp was 200/117.  pt reports she has not missed any medications; takes bp meds as prescribed. Pt reports no longer feels dizzy or sweaty but still has residule HA that is 6/10.  Took tylenol this morning before PT anticipating pain. Was doing a new exercise at PT when symptoms started. Was lying on something like an extra large pool noodle under her upper back on the table with arms crossed across her chest and on her shoulders; she then rolled back and forth on the noodle. During approximately 5th repetition, pt's head began hurting but she continued, then she felt worse and worse (dizzy and sweaty) and had to stop. Was not in pain while doing exercise.  Was not holding her breath during the exercise.  Was not uncomfortable in that position.  Does have a history of carotid artery disease and history of a stent on the left side, sees vascular surgery annually, last July 2023; carotid duplex at that time shows patent stent on the left side with no evidence of restenosis.  Denies chest pain or chest pressure.   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   radiation   Cervical spondylosis    Colitis    Colon polyps    Depression    Diverticulosis    GERD (gastroesophageal reflux disease)    Glaucoma    Headaches, cluster    HTN (hypertension)    Hyperlipidemia    IBS (irritable bowel syndrome)    Kidney stones    Melanoma ([place])    above the L knee    Neuropathy     PONV (postoperative nausea and vomiting)    Tobacco abuse     Patient Active Problem List   Diagnosis Date Noted   Lumbar spinal stenosis due to adjacent segment disease after fusion procedure (L4-S1 fusion, Left L3/4) 11/11/2022   Lumbar post-laminectomy syndrome 11/11/2022   Chronic pain syndrome 11/11/2022   Sacroiliac joint pain 11/11/2022   Lumbar radiculopathy 07/17/2022   History of left foot drop 07/17/2022   Accidental fall 07/17/2022   Pain in limb 05/19/2022   Vitamin B12 deficiency 04/14/2022   Snoring 01/20/2022   DOE (dyspnea on exertion) 01/08/2022   Elevated brain natriuretic peptide (BNP) level 01/08/2022   Pulmonary emphysema ([place]) 01/08/2022   Pulmonary nodule less than 6 mm determined by computed tomography of lung 01/08/2022   Other fatigue 01/08/2022   Family history of breast cancer in female 11/17/2021   Carotid stenosis, left 06/27/2020   Primary osteoarthritis involving multiple joints 03/01/2018   Osteopenia 02/14/2018   HLD (hyperlipidemia) 12/01/2016   Irritable bowel syndrome with diarrhea 12/01/2016   Cancer of nasal cavity and sinus ([place]) 07/07/2015   Essential hypertension, benign 08/08/2013   Mixed urge and stress incontinence 03/27/2013   Allergic rhinitis 12/13/2012   Chronic headaches  02/22/2012   GERD (gastroesophageal reflux disease) 02/22/2012   Vitamin D deficiency 01/04/2012    Past Surgical History:  Procedure Laterality Date   [place]   ARM [place] REPAIR  2001   BACK SURGERY     CAROTID PTA/STENT INTERVENTION Left 06/27/2020   Procedure: CAROTID PTA/STENT INTERVENTION;  Surgeon: Algernon Huxley, MD;  Location: [place] [facility];  Service: Cardiovascular;  Laterality: Left;   CERVICAL FUSION  2010   CHOLECYSTECTOMY  08/2011   Dr Rochel Brome   COLONOSCOPY     Dr Tiffany Kocher   CYSTOSCOPY     Dr. Bernardo Heater   DILATION AND CURETTAGE OF UTERUS     ESOPHAGOGASTRODUODENOSCOPY  2012   Dr Tiffany Kocher    FRACTURE SURGERY  2001   plate in right wrist and arm   IMAGE GUIDED SINUS SURGERY N/A 06/25/2015   Procedure: IMAGE GUIDED SINUS SURGERY;  Surgeon: Beverly Gust, MD;  Location: [facility];  Service: ENT;  Laterality: N/A;   LUMBAR SPINE SURGERY  07/2004   OOPHORECTOMY     POLYPECTOMY N/A 06/25/2015   Procedure: POLYPECTOMY NASAL;  Surgeon: Beverly Gust, MD;  Location: [facility];  Service: ENT;  Laterality: N/A;   [place]     x2    OB History   No obstetric history on file.      Home Medications    Prior to Admission medications   Medication Sig Start Date End Date Taking? Authorizing Provider  aspirin 81 MG chewable tablet Chew 81 mg by mouth daily.   Yes [provider]  albuterol (VENTOLIN HFA) 108 (90 Base) MCG/ACT inhaler Inhale into the lungs every 6 (six) hours as needed. Patient not taking: Reported on 11/11/2022    [provider]  atorvastatin (LIPITOR) 20 MG tablet TAKE 1 TABLET BY MOUTH ONCE A DAY 06/09/22   Eugenia Pancoast, FNP  Azelastine HCl 137 MCG/SPRAY SOLN Place into both nostrils. Patient not taking: Reported on 11/11/2022 01/29/22   [provider]  Budeson-Glycopyrrol-Formoterol (BREZTRI AEROSPHERE) 160-9-4.8 MCG/ACT AERO Inhale 2 puffs into the lungs in the morning and at bedtime. 06/03/22   Tyler Pita, MD  cetirizine (ZYRTEC) 10 MG tablet Take 10 mg by mouth daily. Allergies.    [provider]  Cholecalciferol (VITAMIN D) 50 MCG (2000 UT) CAPS Take 2,000 Units by mouth.    [provider]  clopidogrel (PLAVIX) 75 MG tablet TAKE 1 TABLET BY MOUTH ONCE A DAY Patient not taking: Reported on 11/23/2022 01/28/22   Kris Hartmann, NP  cyclobenzaprine (FLEXERIL) 10 MG tablet Take 1 tablet (10 mg total) by mouth 3 (three) times daily as needed for muscle spasms. 10/15/22   Meade Maw, MD  Dextromethorphan-guaiFENesin (CORICIDIN HBP CONGESTION/COUGH) 10-200 MG CAPS Take by mouth.     [provider]  EPINEPHrine 0.3 mg/0.3 mL IJ SOAJ injection Inject 1 mg into the skin as needed. 08/01/18   [provider]  furosemide (LASIX) 20 MG tablet Take 20 mg by mouth every other day. Patient not taking: Reported on 11/11/2022    [provider]  loperamide (IMODIUM A-D) 2 MG tablet Take 2 mg by mouth 2 (two) times daily as needed for diarrhea or loose stools. Patient not taking: Reported on 11/11/2022    [provider]  losartan (COZAAR) 50 MG tablet TAKE 1 TABLET BY MOUTH ONCE A DAY 09/01/22   Eugenia Pancoast, FNP  montelukast (SINGULAIR)  10 MG tablet TAKE 1 TABLET BY MOUTH AT BEDTIME 11/11/22   Eugenia Pancoast, FNP  Multiple Vitamins-Minerals (CENTRUM ADULTS PO) Take 1 tablet by mouth.    [provider]  pantoprazole (PROTONIX) 40 MG tablet Take 1 tablet (40 mg total) by mouth 2 (two) times daily. 03/01/18   Jearld Fenton, NP  vitamin B-12 (CYANOCOBALAMIN) 1000 MCG tablet Take 1,000 mcg by mouth daily.    [provider]    Family History Family History  Problem Relation Age of Onset   Hypertension Mother    Hyperlipidemia Mother    Diabetes Mother    Colon cancer Father    Pancreatic cancer Father    Cancer Father        Colon & Pancreatic   Breast cancer Paternal Grandmother 17   Diabetes Paternal Grandmother    Coronary artery disease Paternal Grandmother    Heart failure Paternal Grandmother    Cancer Paternal Grandmother        breast   Stroke Brother     Social History Social History   Tobacco Use   Smoking status: Every Day    Packs/day: 0.25    Years: 39.00    Total pack years: 9.75    Types: Cigarettes   Smokeless tobacco: Never   Tobacco comments:    3 cigs daily-11/23/2022  Substance Use Topics   Alcohol use: Not Currently   Drug use: No     Allergies   Albumin (human), Amoxicillin-pot clavulanate, Gabapentin, Latex, Betadine [povidone iodine], Sucralfate, Amoxicillin, Chantix [varenicline  tartrate], Erythromycin, and Sucralfate   Review of Systems Review of Systems   Physical Exam Triage Vital Signs ED Triage Vitals  Enc Vitals Group     BP 11/23/22 1250 (!) 156/79     Pulse Rate 11/23/22 1249 69     Resp 11/23/22 1249 18     Temp 11/23/22 1249 97.8 F (36.6 C)     Temp Source 11/23/22 1249 Oral     SpO2 11/23/22 1249 98 %     Weight --      Height --      Head Circumference --      Peak Flow --      Pain Score 11/23/22 1244 6     Pain Loc --      Pain Edu? --      Excl. in [place]? --    No data found.  Updated Vital Signs BP (!) 156/79   Pulse 69   Temp 97.8 F (36.6 C) (Oral)   Resp 18   SpO2 98%   Visual Acuity Right Eye Distance:   Left Eye Distance:   Bilateral Distance:    Right Eye Near:   Left Eye Near:    Bilateral Near:     Physical Exam Constitutional:      General: She is not in acute distress.    Appearance: Normal appearance. She is not ill-appearing.  HENT:     Head: Normocephalic and atraumatic.  Eyes:     General: No visual field deficit. Cardiovascular:     Rate and Rhythm: Normal rate and regular rhythm.  Pulmonary:     Effort: Pulmonary effort is normal.     Breath sounds: Normal breath sounds.  Neurological:     Mental Status: She is alert and oriented to person, place, and time.     Cranial Nerves: No cranial nerve deficit or facial asymmetry.     Sensory: Sensation is intact.  Motor: No weakness, abnormal muscle tone or pronator drift.     Coordination: Coordination is intact.     Gait: Gait normal.      UC Treatments / Results  Labs (all labs ordered are listed, but only abnormal results are displayed) Labs Reviewed - No data to display  EKG   Radiology No results found.  Procedures Procedures (including critical care time)  Medications Ordered in UC Medications - No data to display  Initial Impression / Assessment and Plan / UC Course  I have reviewed the triage vital signs and the nursing  notes.  Pertinent labs & imaging results that were available during my care of the patient were reviewed by me and considered in my medical decision making (see chart for details).    Blood pressure on arrival to urgent care 156/79.  Recheck at time of discharge was 162/102.  Discussed with patient's reasons for seeking emergency care.  It may be simply that she needs her blood pressure medicine increased.  Recommended she see her PCP as soon as possible and to avoid doing the physical therapy exercise in the future that triggered her symptoms today.  Final Clinical Impressions(s) / UC Diagnoses   Final diagnoses:  Primary hypertension     Discharge Instructions      Please monitor your blood pressure at home. Please make an appointment to see Tabitha as soon as possible. Your blood pressure is better here at Urgent Care than it was at physical therapy but it is still high.    ED Prescriptions   None    PDMP not reviewed this encounter.   Carvel Getting, NP 11/23/22 1413

## 2022-11-23 NOTE — Telephone Encounter (Signed)
Agree with precautions given to pt  Agree with nurse assessment in plan.  Thank you for speaking with them. 

## 2022-11-23 NOTE — Telephone Encounter (Signed)
Per chart review, patient is currently at [facility].

## 2022-11-23 NOTE — Telephone Encounter (Addendum)
Pt calling; no available appts at [facility] today. Pt was advised by access nurse to go to UC per access note. Pt wanted to see if any cancellations at [facility]. Pt just got home and BP now is 196/117 P 111 explained that the BP is extremely high and pt should be eval at ED or UC. Pt said she does not want to go to ED due to wait time.. No cancellations noted at [facility] and pt will go to [facility] in [place] now. Advised if dizziness and pt has H/A pain level 5 to have someone drive her.No CP or SOB. Pt took losartan 50 mg this morning at 7:30 AM and has not missed taking BP med.Pt voiced understanding. UC & ED precautions given again and pt voiced understanding. Sending note to Red Christians FNP.and [place] pool.

## 2022-11-23 NOTE — ED Triage Notes (Signed)
Pt presents to uc with co of htn. Pt reports she was at pt today and she got really dizzy and broke out in a sweat. They took her vitals at pt and her bp was 200/117 pt reports she haas not missed any medications. Pt reports ha that is 6/10 denies any otc medications

## 2022-11-23 NOTE — Discharge Instructions (Signed)
Please monitor your blood pressure at home. Please make an appointment to see Tabitha as soon as possible. Your blood pressure is better here at Urgent Care than it was at physical therapy but it is still high.

## 2022-11-23 NOTE — Telephone Encounter (Signed)
Patient physical therapist Heather Beasley called in and stated that [facility] blood pressure is 200/120, heart rate is 80 , and her oxygen is 99. He stated that she was experiencing some headache and dizziness. Sent over to access nurse.

## 2022-11-23 NOTE — Telephone Encounter (Signed)
Heather Beasley - Client TELEPHONE ADVICE RECORD [facility] Patient Name: Heather Beasley [facility] Gender: Female DOB: 01-22-1956 Age: 67 Y 33 M 10 D Return Phone Number: [phone] (Secondary) Address: City/ State/ Zip: [place] [address] Client [facility] Beasley - Client Client Site [place] - Beasley Provider AA - PHYSICIAN, NOT LISTED- MD Contact Type Call Who Is Calling Patient / Member / Family / Caregiver Call Type Triage / Clinical Caller Name Eden Emms Relationship To Patient Other Return Phone Number Please choose phone number Chief Complaint BLOOD PRESSURE HIGH - Systolic (top number) [phone] or [phone] Reason for Call Symptomatic / Request for [place] states they are the physical therapist calling for pt. They were in the middle of session and pt has BP 200/120 HR 80. [place] on [address] Translation No Nurse Assessment Nurse: Quentin Ore, RN, April Date/Time Eilene Ghazi Time): 11/23/2022 10:27:36 AM Confirm and document reason for call. If symptomatic, describe symptoms. ---Caller states they are the physical therapist calling for pt that was dizzy upon standing, and pt has BP 200/120 HR 80. Does the patient have any new or worsening symptoms? ---Yes Will a triage be completed? ---Yes Related visit to physician within the last 2 weeks? ---No Does the PT have any chronic conditions? (i.e. diabetes, asthma, this includes High risk factors for pregnancy, etc.) ---Yes List chronic conditions. ---HTN Multiple back surgeries Asthma Adenocarcinoma CAD/CHF/COPD Still smokes Is this a behavioral health or substance abuse call? ---No Guidelines Guideline Title Affirmed Question Affirmed Notes Nurse Date/Time (Eastern Time) Blood Pressure - High [7] Systolic BP >= 829 OR Diastolic >= 562 AND [1] having NO cardiac or neurologic symptoms Quentin Ore,  RN, April 11/23/2022 10:30:04 AM PLEASE NOTE: All timestamps contained within this report are represented as [place] Standard Time. CONFIDENTIALTY NOTICE: This fax transmission is intended only for the addressee. It contains information that is legally privileged, confidential or otherwise protected from use or disclosure. If you are not the intended recipient, you are strictly prohibited from reviewing, disclosing, copying using or disseminating any of this information or taking any action in reliance on or regarding this information. If you have received this fax in error, please notify us immediately by telephone so that we can arrange for its return to [place]. Phone: [phone], Toll-Free: [phone], Fax: [phone] Page: 2 of 2 Call Id: [id number] [place]. Time Eilene Ghazi Time) Disposition Final User 11/23/2022 10:25:29 AM Send to Urgent Kellie Moor 11/23/2022 10:36:47 AM See HCP within 4 Hours (or PCP triage) Netta Cedars, RN, April Final Disposition 11/23/2022 10:36:47 AM See HCP within 4 Hours (or PCP triage) Netta Cedars, RN, April Caller Disagree/Comply Comply Caller Understands Yes PreDisposition Go to ED Care Advice Given Per Guideline SEE HCP (OR PCP TRIAGE) WITHIN 4 HOURS: * UCC: Some UCCs can manage patients who are stable and have less serious symptoms (e.g., minor illnesses and injuries). The triager must know the [facility] capabilities before sending a patient there. If unsure, call ahead. CALL BACK IF: * Weakness or numbness of the face, arm or leg on one side of the body occurs * Difficulty walking, difficulty talking, or severe headache occurs * Chest pain or difficulty breathing occurs * You become worse CARE ADVICE given per High Blood Pressure (Adult) guideline. * IF OFFICE WILL BE OPEN: You need to be seen within the next 3 or 4 hours. Call your doctor (or NP/PA) now or as soon as  the office opens. Comments User: April, Lambert, RN Date/Time Eilene Ghazi Time): 11/23/2022  10:38:54 AM RN called backline, long wait time and previously told no apts avialable, referred to UC. Referrals GO TO FACILITY OTHER - SPECIF

## 2022-11-24 ENCOUNTER — Ambulatory Visit (INDEPENDENT_AMBULATORY_CARE_PROVIDER_SITE_OTHER)
Admission: RE | Admit: 2022-11-24 | Discharge: 2022-11-24 | Disposition: A | Discharge status: Home / Self Care | Payer: Medicare PPO | Source: Ambulatory Visit | Attending: Family | Admitting: Family

## 2022-11-24 ENCOUNTER — Encounter: Payer: Self-pay | Admitting: Family

## 2022-11-24 ENCOUNTER — Ambulatory Visit (INDEPENDENT_AMBULATORY_CARE_PROVIDER_SITE_OTHER): Payer: Medicare PPO | Admitting: Family

## 2022-11-24 VITALS — BP 158/98 | HR 120 | Temp 98.6°F | Ht 65.0 in | Wt 185.0 lb

## 2022-11-24 DIAGNOSIS — I10 Essential (primary) hypertension: Secondary | ICD-10-CM | POA: Diagnosis not present

## 2022-11-24 DIAGNOSIS — R0689 Other abnormalities of breathing: Secondary | ICD-10-CM | POA: Diagnosis not present

## 2022-11-24 DIAGNOSIS — M533 Sacrococcygeal disorders, not elsewhere classified: Secondary | ICD-10-CM | POA: Diagnosis not present

## 2022-11-24 DIAGNOSIS — R7989 Other specified abnormal findings of blood chemistry: Secondary | ICD-10-CM | POA: Diagnosis not present

## 2022-11-24 DIAGNOSIS — R0989 Other specified symptoms and signs involving the circulatory and respiratory systems: Secondary | ICD-10-CM | POA: Diagnosis not present

## 2022-11-24 DIAGNOSIS — R531 Weakness: Secondary | ICD-10-CM | POA: Diagnosis not present

## 2022-11-24 LAB — BASIC METABOLIC PANEL
BUN: 13 mg/dL (ref 6–23)
CO2: 32 mEq/L (ref 19–32)
Calcium: 10.3 mg/dL (ref 8.4–10.5)
Chloride: 103 mEq/L (ref 96–112)
Creatinine, Ser: 0.83 mg/dL (ref 0.40–1.20)
GFR: 74.49 mL/min (ref >60.00)
Glucose, Bld: 92 mg/dL (ref 70–99)
Potassium: 5 mEq/L (ref 3.5–5.1)
Sodium: 142 mEq/L (ref 135–145)

## 2022-11-24 LAB — CBC
HCT: 43.3 % (ref 36.0–46.0)
Hemoglobin: 14.3 g/dL (ref 12.0–15.0)
MCHC: 33.1 g/dL (ref 30.0–36.0)
MCV: 101.1 fl — ABNORMAL HIGH (ref 78.0–100.0)
Platelets: 222 10*3/uL (ref 150.0–400.0)
RBC: 4.28 Mil/uL (ref 3.87–5.11)
RDW: 13.1 % (ref 11.5–15.5)
WBC: 4.8 10*3/uL (ref 4.0–10.5)

## 2022-11-24 LAB — BRAIN NATRIURETIC PEPTIDE: Pro B Natriuretic peptide (BNP): 242 pg/mL — ABNORMAL HIGH (ref 0.0–100.0)

## 2022-11-24 LAB — TSH: TSH: 2.7 u[IU]/mL (ref 0.35–5.50)

## 2022-11-24 MED ORDER — LOSARTAN POTASSIUM 100 MG PO TABS: 100.0000 mg | ORAL_TABLET | Freq: Every day | ORAL | 0 refills | Status: DC

## 2022-11-24 NOTE — Patient Instructions (Addendum)
 ------------------------------------    Goal is <140/90  Start monitoring your blood pressure daily, around the same time of day, for the next 2 weeks.  Ensure that you have rested for 30 minutes prior to checking your blood pressure. Record your readings and bring them to your next visit.  ------------------------------------  Stop by the lab prior to leaving today. I will notify you of your results once received.    Regards,   Eugenia Pancoast FNP-C

## 2022-11-24 NOTE — Addendum Note (Signed)
Addended by: Ellamae Sia on: 11/24/2022 03:30 PM   Modules accepted: Orders

## 2022-11-24 NOTE — Assessment & Plan Note (Signed)
Worsening Maintain appts with neurosurgery

## 2022-11-24 NOTE — Assessment & Plan Note (Addendum)
Worsening Increase losartan to 100 mg once daily from 50 mg.  Watch sodium in diet.  Labs today to r/o secondary causes r/o hyperthyroid, anemia, etc  Did advise pt to also f/u with cardiology

## 2022-11-24 NOTE — Progress Notes (Signed)
Established Patient Office Visit  Subjective:      CC:  Chief Complaint  Patient presents with   Hypertension    Follow up    HPI: Heather Beasley is a 67 y.o. female presenting on 11/24/2022 for Hypertension (Follow up) . Went to urgent care yesterday for c/o of elevated blood pressure with bad headache and feeling dizzy. She had been breaking out in a sweat as well, her bp was 200/117 yesterday.   Recheck prior to discharge was 162/102.   6/23 when last seen in office was on losartan 25 mg , was taking half of 50 mg because blood pressure was averaging 110/80 or so.   12/31/21 NSR   New complaints:  Today states anytime she gets up to walk around or go to the bathroom she feels extreme weakness and doesn't feel well. No longer feeling sweaty and or having a bad headache. Has been checking blood pressure at home and average is anywhere from 150/95 to 197/117. Heart rate around 90-105.    Finds harder for her to breath when she is lying flat on the table.   Denies fluids in the legs.   No increased sob in general.   Has not been taking furosemide from cardiologist that she was   Taking prior about three times weekly.    Denies cp palp   Reviewed echocardiogram 01/21/22  Social history:  Relevant past medical, surgical, family and social history reviewed and updated as indicated. Interim medical history since our last visit reviewed.  Allergies and medications reviewed and updated.  DATA REVIEWED: CHART IN EPIC     ROS: Negative unless specifically indicated above in HPI.    Current Outpatient Medications:    aspirin 81 MG chewable tablet, Chew 81 mg by mouth daily., Disp: , Rfl:    atorvastatin (LIPITOR) 20 MG tablet, TAKE 1 TABLET BY MOUTH ONCE A DAY, Disp: 90 tablet, Rfl: 3   Budeson-Glycopyrrol-Formoterol (BREZTRI AEROSPHERE) 160-9-4.8 MCG/ACT AERO, Inhale 2 puffs into the lungs in the morning and at bedtime., Disp: 10.7 g, Rfl: 11   cetirizine (ZYRTEC) 10 MG  tablet, Take 10 mg by mouth daily. Allergies., Disp: , Rfl:    Cholecalciferol (VITAMIN D) 50 MCG (2000 UT) CAPS, Take 2,000 Units by mouth., Disp: , Rfl:    clopidogrel (PLAVIX) 75 MG tablet, TAKE 1 TABLET BY MOUTH ONCE A DAY, Disp: 90 tablet, Rfl: 3   cyclobenzaprine (FLEXERIL) 10 MG tablet, Take 1 tablet (10 mg total) by mouth 3 (three) times daily as needed for muscle spasms., Disp: 90 tablet, Rfl: 0   EPINEPHrine 0.3 mg/0.3 mL IJ SOAJ injection, Inject 1 mg into the skin as needed., Disp: , Rfl:    losartan (COZAAR) 100 MG tablet, Take 1 tablet (100 mg total) by mouth daily., Disp: 90 tablet, Rfl: 0   montelukast (SINGULAIR) 10 MG tablet, TAKE 1 TABLET BY MOUTH AT BEDTIME, Disp: 90 tablet, Rfl: 3   Multiple Vitamins-Minerals (CENTRUM ADULTS PO), Take 1 tablet by mouth., Disp: , Rfl:    pantoprazole (PROTONIX) 40 MG tablet, Take 1 tablet (40 mg total) by mouth 2 (two) times daily., Disp: 60 tablet, Rfl: 11   vitamin B-12 (CYANOCOBALAMIN) 1000 MCG tablet, Take 1,000 mcg by mouth daily., Disp: , Rfl:    furosemide (LASIX) 20 MG tablet, Take 20 mg by mouth every other day. (Patient not taking: Reported on 11/11/2022), Disp: , Rfl:       Objective:    BP (!) 158/98  Comment: left arm  Pulse (!) 120   Temp 98.6 F (37 C) (Oral)   Ht '5\' 5"'$  (1.651 m)   Wt 185 lb (83.9 kg)   SpO2 98%   BMI 30.79 kg/m   Wt Readings from Last 3 Encounters:  11/24/22 185 lb (83.9 kg)  11/11/22 183 lb (83 kg)  10/15/22 183 lb 9.6 oz (83.3 kg)    Physical Exam Constitutional:      General: She is not in acute distress.    Appearance: Normal appearance. She is normal weight. She is not ill-appearing, toxic-appearing or diaphoretic.  HENT:     Head: Normocephalic.  Cardiovascular:     Rate and Rhythm: Normal rate and regular rhythm.     Pulses: Normal pulses.  Pulmonary:     Effort: Pulmonary effort is normal.     Breath sounds: Examination of the right-lower field reveals decreased breath sounds.  Decreased breath sounds present. No wheezing, rhonchi or rales.  Musculoskeletal:        General: Normal range of motion.     Right lower leg: No edema.     Left lower leg: No edema.  Neurological:     General: No focal deficit present.     Mental Status: She is alert and oriented to person, place, and time. Mental status is at baseline.  Psychiatric:        Mood and Affect: Mood normal.        Behavior: Behavior normal.        Thought Content: Thought content normal.        Judgment: Judgment normal.            Assessment & Plan:  Generalized weakness -     CBC -     Basic metabolic panel -     POCT Urinalysis Dipstick (Automated); Future  Elevated blood pressure reading in office with diagnosis of hypertension -     CBC -     Basic metabolic panel -     TSH -     Losartan Potassium; Take 1 tablet (100 mg total) by mouth daily.  Dispense: 90 tablet; Refill: 0 -     Microalbumin / creatinine urine ratio  Elevated brain natriuretic peptide (BNP) level Assessment & Plan: Repeat bnp if elevation consider lasix restart three times weekly  Echo reviewed from 2023, wnl  Orders: -     Brain natriuretic peptide  Decreased breath sounds at right lung base Assessment & Plan: Cxr today in office to r/o any other ddx to include pulmonary nodule, acute exacerbation, pneumonia etc  Orders: -     DG Chest 2 View; Future  Sacroiliac joint pain Assessment & Plan: Worsening Maintain appts with neurosurgery    Essential hypertension, benign Assessment & Plan: Worsening Increase losartan to 100 mg once daily from 50 mg.  Watch sodium in diet.  Labs today to r/o secondary causes r/o hyperthyroid, anemia, etc  Did advise pt to also f/u with cardiology      Return in about 2 weeks (around 12/08/2022).  Eugenia Pancoast, MSN, APRN, FNP-C [place]

## 2022-11-24 NOTE — Assessment & Plan Note (Signed)
Cxr today in office to r/o any other ddx to include pulmonary nodule, acute exacerbation, pneumonia etc

## 2022-11-24 NOTE — Assessment & Plan Note (Signed)
Repeat bnp if elevation consider lasix restart three times weekly  Echo reviewed from 2023, wnl

## 2022-11-25 ENCOUNTER — Telehealth: Payer: Self-pay | Admitting: Student in an Organized Health Care Education/Training Program

## 2022-11-25 LAB — MICROALBUMIN / CREATININE URINE RATIO
Creatinine,U: 57.3 mg/dL
Microalb Creat Ratio: 2.4 mg/g (ref 0.0–30.0)
Microalb, Ur: 1.4 mg/dL (ref 0.0–1.9)

## 2022-11-25 LAB — POC URINALSYSI DIPSTICK (AUTOMATED)
Bilirubin, UA: NEGATIVE
Blood, UA: NEGATIVE
Glucose, UA: NEGATIVE
Ketones, UA: NEGATIVE
Leukocytes, UA: NEGATIVE
Nitrite, UA: NEGATIVE
Protein, UA: NEGATIVE
Spec Grav, UA: 1.01 (ref 1.010–1.025)
Urobilinogen, UA: 0.2 E.U./dL
pH, UA: 6 (ref 5.0–8.0)

## 2022-11-25 NOTE — Telephone Encounter (Signed)
Patient is scheduled for Monday injections. Her blood pressure is running very high 181/106. She has been to pcp and had blood work done, it also is over the limits. Her pcp said they would leave it up to Dr. Holley Raring about doing injections,. Patient would like to speak with nurse for advice please.

## 2022-11-25 NOTE — Progress Notes (Signed)
Have pt try to restart furosemide three times weekly to see if there is improvement in blood pressure. Continue losartan 100 mg as increased as visit yesterday. BNP was slightly elevated, not terribly increased since last check but slight so I think the furosemide may be helpful.

## 2022-11-25 NOTE — Telephone Encounter (Signed)
Spoke with Dr. Holley Raring, if diastolic remains above 614, prefer to cancel. Informed patient. She will monitor her BP, and cancel if needed.

## 2022-11-26 DIAGNOSIS — M5117 Intervertebral disc disorders with radiculopathy, lumbosacral region: Secondary | ICD-10-CM | POA: Diagnosis not present

## 2022-11-26 DIAGNOSIS — M5126 Other intervertebral disc displacement, lumbar region: Secondary | ICD-10-CM | POA: Diagnosis not present

## 2022-11-30 ENCOUNTER — Ambulatory Visit
Payer: Medicare PPO | Attending: Student in an Organized Health Care Education/Training Program | Admitting: Student in an Organized Health Care Education/Training Program

## 2022-11-30 DIAGNOSIS — M5126 Other intervertebral disc displacement, lumbar region: Secondary | ICD-10-CM | POA: Diagnosis not present

## 2022-11-30 DIAGNOSIS — J301 Allergic rhinitis due to pollen: Secondary | ICD-10-CM | POA: Diagnosis not present

## 2022-11-30 DIAGNOSIS — M5117 Intervertebral disc disorders with radiculopathy, lumbosacral region: Secondary | ICD-10-CM | POA: Diagnosis not present

## 2022-12-01 ENCOUNTER — Encounter: Payer: Self-pay | Admitting: Family

## 2022-12-02 NOTE — Telephone Encounter (Signed)
Please see patient response

## 2022-12-03 ENCOUNTER — Other Ambulatory Visit: Payer: Self-pay | Admitting: Family

## 2022-12-03 DIAGNOSIS — M5126 Other intervertebral disc displacement, lumbar region: Secondary | ICD-10-CM | POA: Diagnosis not present

## 2022-12-03 DIAGNOSIS — I10 Essential (primary) hypertension: Secondary | ICD-10-CM

## 2022-12-03 DIAGNOSIS — M5117 Intervertebral disc disorders with radiculopathy, lumbosacral region: Secondary | ICD-10-CM | POA: Diagnosis not present

## 2022-12-03 MED ORDER — AMLODIPINE BESYLATE 5 MG PO TABS: 5.0000 mg | ORAL_TABLET | Freq: Every day | ORAL | 1 refills | Status: DC

## 2022-12-03 NOTE — Telephone Encounter (Signed)
Noted.  Pt with appt for f/u blood pressure 2/13.

## 2022-12-07 ENCOUNTER — Encounter: Payer: Self-pay | Admitting: Pulmonary Disease

## 2022-12-07 DIAGNOSIS — J301 Allergic rhinitis due to pollen: Secondary | ICD-10-CM | POA: Diagnosis not present

## 2022-12-08 ENCOUNTER — Ambulatory Visit (INDEPENDENT_AMBULATORY_CARE_PROVIDER_SITE_OTHER): Payer: Medicare PPO | Admitting: Family

## 2022-12-08 ENCOUNTER — Encounter: Payer: Self-pay | Admitting: Family

## 2022-12-08 VITALS — BP 130/82 | HR 92 | Temp 97.8°F | Ht 65.0 in | Wt 188.6 lb

## 2022-12-08 DIAGNOSIS — R0609 Other forms of dyspnea: Secondary | ICD-10-CM | POA: Diagnosis not present

## 2022-12-08 DIAGNOSIS — I10 Essential (primary) hypertension: Secondary | ICD-10-CM | POA: Diagnosis not present

## 2022-12-08 DIAGNOSIS — R000 Tachycardia, unspecified: Secondary | ICD-10-CM | POA: Diagnosis not present

## 2022-12-08 DIAGNOSIS — M533 Sacrococcygeal disorders, not elsewhere classified: Secondary | ICD-10-CM | POA: Diagnosis not present

## 2022-12-08 DIAGNOSIS — M5126 Other intervertebral disc displacement, lumbar region: Secondary | ICD-10-CM | POA: Diagnosis not present

## 2022-12-08 DIAGNOSIS — M5117 Intervertebral disc disorders with radiculopathy, lumbosacral region: Secondary | ICD-10-CM | POA: Diagnosis not present

## 2022-12-08 NOTE — Assessment & Plan Note (Signed)
Schedule f/u with pain management

## 2022-12-08 NOTE — Assessment & Plan Note (Signed)
Stable Continue lasix every other day

## 2022-12-08 NOTE — Progress Notes (Signed)
Established Patient Office Visit  Subjective:      CC:  Chief Complaint  Patient presents with   Medical Management of Chronic Issues    HPI: Heather Beasley is a 67 y.o. female presenting on 12/08/2022 for Medical Management of Chronic Issues . HTN: elevated blood pressure readings at home, seems to be fluctuating. Per blood pressure log 99/71 highest 176/96. Does feel her heart racing at times, and she does have heart rate records of 122 at highest. Has seen cardiologist has f/u in may, last visit was in may 2023. She does have pain often/constant in her right hip, even right now the pain is 7/10. Reviewed note from 5/5 echo with normal LV and no significant valve disease with MR , mild.   New complaints:  Feeling increasingly sluggish.  Did start plavix again three days ago. She cancelled her appt with pain clinic to get epidural shots as blood pressure was so high, will reschedule to get this done.  Had a sleep study which was unremarkable.      Social history:  Relevant past medical, surgical, family and social history reviewed and updated as indicated. Interim medical history since our last visit reviewed.  Allergies and medications reviewed and updated.  DATA REVIEWED: CHART IN EPIC     ROS: Negative unless specifically indicated above in HPI.    Current Outpatient Medications:    amLODipine (NORVASC) 5 MG tablet, Take 1 tablet (5 mg total) by mouth daily., Disp: 90 tablet, Rfl: 1   atorvastatin (LIPITOR) 20 MG tablet, TAKE 1 TABLET BY MOUTH ONCE A DAY, Disp: 90 tablet, Rfl: 3   Budeson-Glycopyrrol-Formoterol (BREZTRI AEROSPHERE) 160-9-4.8 MCG/ACT AERO, Inhale 2 puffs into the lungs in the morning and at bedtime., Disp: 10.7 g, Rfl: 11   cetirizine (ZYRTEC) 10 MG tablet, Take 10 mg by mouth daily. Allergies., Disp: , Rfl:    Cholecalciferol (VITAMIN D) 50 MCG (2000 UT) CAPS, Take 2,000 Units by mouth., Disp: , Rfl:    clopidogrel (PLAVIX) 75 MG tablet, TAKE 1  TABLET BY MOUTH ONCE A DAY, Disp: 90 tablet, Rfl: 3   cyclobenzaprine (FLEXERIL) 10 MG tablet, Take 1 tablet (10 mg total) by mouth 3 (three) times daily as needed for muscle spasms., Disp: 90 tablet, Rfl: 0   EPINEPHrine 0.3 mg/0.3 mL IJ SOAJ injection, Inject 1 mg into the skin as needed., Disp: , Rfl:    furosemide (LASIX) 20 MG tablet, Take 20 mg by mouth every other day., Disp: , Rfl:    losartan (COZAAR) 100 MG tablet, Take 1 tablet (100 mg total) by mouth daily., Disp: 90 tablet, Rfl: 0   montelukast (SINGULAIR) 10 MG tablet, TAKE 1 TABLET BY MOUTH AT BEDTIME, Disp: 90 tablet, Rfl: 3   Multiple Vitamins-Minerals (CENTRUM ADULTS PO), Take 1 tablet by mouth., Disp: , Rfl:    pantoprazole (PROTONIX) 40 MG tablet, Take 1 tablet (40 mg total) by mouth 2 (two) times daily., Disp: 60 tablet, Rfl: 11   vitamin B-12 (CYANOCOBALAMIN) 1000 MCG tablet, Take 1,000 mcg by mouth daily., Disp: , Rfl:       Objective:    BP 130/82   Pulse 92   Temp 97.8 F (36.6 C) (Temporal)   Ht 5' 5"$  (1.651 m)   Wt 188 lb 9.6 oz (85.5 kg)   SpO2 98%   BMI 31.38 kg/m   Wt Readings from Last 3 Encounters:  12/08/22 188 lb 9.6 oz (85.5 kg)  11/24/22 185 lb (83.9 kg)  11/11/22 183 lb (83 kg)    Physical Exam Constitutional:      General: She is not in acute distress.    Appearance: Normal appearance. She is normal weight. She is not ill-appearing, toxic-appearing or diaphoretic.  HENT:     Head: Normocephalic.  Cardiovascular:     Rate and Rhythm: Normal rate and regular rhythm.  Pulmonary:     Effort: Pulmonary effort is normal.  Musculoskeletal:        General: Normal range of motion.     Right lower leg: No edema.     Left lower leg: No edema.  Neurological:     General: No focal deficit present.     Mental Status: She is alert and oriented to person, place, and time. Mental status is at baseline.  Psychiatric:        Mood and Affect: Mood normal.        Behavior: Behavior normal.         Thought Content: Thought content normal.        Judgment: Judgment normal.            Assessment & Plan:  Racing heart beat -     EKG 12-Lead  Essential hypertension, benign Assessment & Plan: Improving Continue amlodipine 5 mg and losartan 100 mg  Advised pt to f/u with cardiologist as beginning to fluctuate and also EKG today with t wave abn , seem to be stable compared to prior however would like pt to f/u with him for intermittent tachycardia Reviewed old echocardiogram EKG in office today with flipped T waves and NSR  Maybe consider BB Advised pt to continue with lasix every other day   Advised pt if any sudden chest pain increasing sob go to er and or call 911    DOE (dyspnea on exertion) Assessment & Plan: Stable Continue lasix every other day    Sacroiliac joint pain Assessment & Plan: Schedule f/u with pain management       No follow-ups on file.  Eugenia Pancoast, MSN, APRN, FNP-C [place]

## 2022-12-08 NOTE — Patient Instructions (Signed)
  Call cardiology for evaluation.

## 2022-12-08 NOTE — Assessment & Plan Note (Addendum)
Improving Continue amlodipine 5 mg and losartan 100 mg  Advised pt to f/u with cardiologist as beginning to fluctuate and also EKG today with t wave abn , seem to be stable compared to prior however would like pt to f/u with him for intermittent tachycardia Reviewed old echocardiogram EKG in office today with flipped T waves and NSR  Maybe consider BB Advised pt to continue with lasix every other day   Advised pt if any sudden chest pain increasing sob go to er and or call 911

## 2022-12-10 ENCOUNTER — Ambulatory Visit: Payer: Medicare PPO | Admitting: Neurosurgery

## 2022-12-10 ENCOUNTER — Encounter: Payer: Self-pay | Admitting: Neurosurgery

## 2022-12-10 VITALS — BP 166/98 | Ht 65.0 in | Wt 183.2 lb

## 2022-12-10 DIAGNOSIS — M5441 Lumbago with sciatica, right side: Secondary | ICD-10-CM | POA: Diagnosis not present

## 2022-12-10 DIAGNOSIS — M5136 Other intervertebral disc degeneration, lumbar region: Secondary | ICD-10-CM

## 2022-12-10 DIAGNOSIS — M4316 Spondylolisthesis, lumbar region: Secondary | ICD-10-CM | POA: Diagnosis not present

## 2022-12-10 DIAGNOSIS — G8929 Other chronic pain: Secondary | ICD-10-CM | POA: Diagnosis not present

## 2022-12-10 DIAGNOSIS — M5117 Intervertebral disc disorders with radiculopathy, lumbosacral region: Secondary | ICD-10-CM | POA: Diagnosis not present

## 2022-12-10 DIAGNOSIS — M5126 Other intervertebral disc displacement, lumbar region: Secondary | ICD-10-CM | POA: Diagnosis not present

## 2022-12-10 NOTE — Progress Notes (Signed)
Referring Physician:  Eugenia Pancoast, [place],  [place] [address]  Primary Physician:  Eugenia Pancoast, FNP  History of Present Illness: 12/10/2022 Ms. Appleman returns to see me she continues to smoke 2 cigarettes/day.  She has had some significant issues with her blood pressure.  She is seeing her cardiologist tomorrow.  She has been doing physical therapy without improvement.  10/15/2022 Ms. Der Fei is here today with a chief complaint of low back pain that radiates into her right hip. She saw [facility] for hip and on 10/09/22 was put on a medrol dosepak which has not seemed to help.  She has been having trouble for approximately 2 months.  She reports sharp and throbbing pain into her right hip.  She has pain around her right knee when she stands or bends.  Walking makes it worse.  Sitting and resting makes it better.  Her pain has been worsening over time and is now interfering with her day-to-day life.   Bowel/Bladder Dysfunction: none  Conservative measures:  Physical therapy: currently participating in at [facility]  Multimodal medical therapy including regular antiinflammatories: prednisone, flexeril, tylenol  Injections: no recent epidural steroid injections  Past Surgery:  Cervical Fusion by Dr. Joya Salm Multiple lumbar surgeries with the last one 12 years ago by Dr. Steffanie Dunn has no symptoms of cervical myelopathy.  The symptoms are causing a significant impact on the patient's life.   Progress Note from Geronimo Boot, [place] on 08/27/22:   History of Present Illness: NATEESHA KORIN is a 67 y.o. female who presents to review her lumbar MRI.    History of cervical and lumbar fusion. Has chronic left foot drop. Fusion at L4-S1 looked good on previous xrays. She has known adjacent level spondylosis at L3-L4.    She continues with constant LBP with right hip pain into her groin. She also has pain in anterior thigh, not below her  knee. She notes numbness/tingling in right leg (entire leg). Right leg from knee down has been feeling more numb at times. She has intermittent spasms at night and is taking flexeril. Needs a refill. Previous ESIs made her pain worse. No recent PT.  I have utilized the care everywhere function in epic to review the outside records available from external health systems.  Review of Systems:  A 10 point review of systems is negative, except for the pertinent positives and negatives detailed in the HPI.  Past Medical History: Past Medical History:  Diagnosis Date   Allergic rhinitis    Arthritis    Asthma    Cancer of nasal cavity and sinus ([place]) 07/07/2015   radiation   Cervical spondylosis    Colitis    Colon polyps    Depression    Diverticulosis    GERD (gastroesophageal reflux disease)    Glaucoma    Headaches, cluster    HTN (hypertension)    Hyperlipidemia    IBS (irritable bowel syndrome)    Kidney stones    Melanoma ([place])    above the L knee    Neuropathy    PONV (postoperative nausea and vomiting)    Tobacco abuse     Past Surgical History: Past Surgical History:  Procedure Laterality Date   [place]     CAROTID PTA/STENT INTERVENTION Left 06/27/2020   Procedure: CAROTID  PTA/STENT INTERVENTION;  Surgeon: Algernon Huxley, MD;  Location: [place] [facility];  Service: Cardiovascular;  Laterality: Left;   CERVICAL FUSION  2010   CHOLECYSTECTOMY  08/2011   Dr Rochel Brome   COLONOSCOPY     Dr Tiffany Kocher   CYSTOSCOPY     Dr. Bernardo Heater   DILATION AND CURETTAGE OF UTERUS     ESOPHAGOGASTRODUODENOSCOPY  2012   Dr Tiffany Kocher   FRACTURE SURGERY  2001   plate in right wrist and arm   IMAGE GUIDED SINUS SURGERY N/A 06/25/2015   Procedure: IMAGE GUIDED SINUS SURGERY;  Surgeon: Beverly Gust, MD;  Location: [facility];  Service: ENT;  Laterality: N/A;   LUMBAR SPINE SURGERY  07/2004   OOPHORECTOMY      POLYPECTOMY N/A 06/25/2015   Procedure: POLYPECTOMY NASAL;  Surgeon: Beverly Gust, MD;  Location: [facility];  Service: ENT;  Laterality: N/A;   [place]     x2    Allergies: Allergies as of 12/10/2022 - Review Complete 12/10/2022  Allergen Reaction Noted   Albumin (human) Rash 02/26/2015   Amoxicillin-pot clavulanate Hives 02/12/2012   Gabapentin Shortness Of Breath 02/13/2014   Latex Dermatitis and Rash 05/13/2011   Betadine [povidone iodine] Dermatitis, Rash, and Other (See Comments) 05/13/2011   Sucralfate  02/26/2015   Amoxicillin  09/14/2007   Chantix [varenicline tartrate]  01/04/2012   Erythromycin  09/14/2007   Sucralfate Nausea And Vomiting 01/04/2012    Medications: Current Meds  Medication Sig   amLODipine (NORVASC) 5 MG tablet Take 1 tablet (5 mg total) by mouth daily.   atorvastatin (LIPITOR) 20 MG tablet TAKE 1 TABLET BY MOUTH ONCE A DAY   Budeson-Glycopyrrol-Formoterol (BREZTRI AEROSPHERE) 160-9-4.8 MCG/ACT AERO Inhale 2 puffs into the lungs in the morning and at bedtime.   cetirizine (ZYRTEC) 10 MG tablet Take 10 mg by mouth daily. Allergies.   Cholecalciferol (VITAMIN D) 50 MCG (2000 UT) CAPS Take 2,000 Units by mouth.   clopidogrel (PLAVIX) 75 MG tablet TAKE 1 TABLET BY MOUTH ONCE A DAY   cyclobenzaprine (FLEXERIL) 10 MG tablet Take 1 tablet (10 mg total) by mouth 3 (three) times daily as needed for muscle spasms.   EPINEPHrine 0.3 mg/0.3 mL IJ SOAJ injection Inject 1 mg into the skin as needed.   furosemide (LASIX) 20 MG tablet Take 20 mg by mouth every other day.   losartan (COZAAR) 100 MG tablet Take 1 tablet (100 mg total) by mouth daily.   montelukast (SINGULAIR) 10 MG tablet TAKE 1 TABLET BY MOUTH AT BEDTIME   Multiple Vitamins-Minerals (CENTRUM ADULTS PO) Take 1 tablet by mouth.   pantoprazole (PROTONIX) 40 MG tablet Take 1 tablet (40 mg total) by mouth 2 (two) times daily.   vitamin B-12 (CYANOCOBALAMIN) 1000 MCG tablet  Take 1,000 mcg by mouth daily.    Social History: Social History   Tobacco Use   Smoking status: Every Day    Packs/day: 0.25    Years: 39.00    Total pack years: 9.75    Types: Cigarettes   Smokeless tobacco: Never   Tobacco comments:    2 cigs daily-12/10/22  Substance Use Topics   Alcohol use: Not Currently   Drug use: No    Family Medical History: Family History  Problem Relation Age of Onset   Hypertension Mother    Hyperlipidemia Mother    Diabetes Mother    Colon cancer Father    Pancreatic cancer Father    Cancer  Father        Colon & Pancreatic   Breast cancer Paternal Grandmother 73   Diabetes Paternal Grandmother    Coronary artery disease Paternal Grandmother    Heart failure Paternal Grandmother    Cancer Paternal Grandmother        breast   Stroke Brother     Physical Examination: Vitals:   12/10/22 1048  BP: (!) 166/98     General: Patient is well developed, well nourished, calm, collected, and in no apparent distress. Attention to examination is appropriate.  Neck:   Supple.  Full range of motion.  Respiratory: Patient is breathing without any difficulty.   NEUROLOGICAL:     Awake, alert, oriented to person, place, and time.  Speech is clear and fluent. Fund of knowledge is appropriate.   Cranial Nerves: Pupils equal round and reactive to light.  Facial tone is symmetric.  Facial sensation is symmetric. Shoulder shrug is symmetric. Tongue protrusion is midline.  There is no pronator drift.  ROM of spine: full.    Strength: Side Biceps Triceps Deltoid Interossei Grip Wrist Ext. Wrist Flex.  R 5 5 5 5 5 5 5  $ L 5 5 5 5 5 5 5   $ Side Iliopsoas Quads Hamstring PF DF EHL  R 5 5 5 5 5 5  $ L 5 5 5 5 5 5   $ Reflexes are 1+ and symmetric at the biceps, triceps, brachioradialis, patella and achilles.   Hoffman's is absent.   Bilateral upper and lower extremity sensation is intact to light touch.    No evidence of dysmetria noted.  Gait is  normal.     Medical Decision Making  Imaging: MRI L spine 08/18/2022 IMPRESSION: 1. Status post posterior instrumented fusion and decompression from L4 through S1 without residual spinal canal or neural foraminal stenosis. 2. Adjacent segment disease at L3-L4 has significantly progressed since 2014 with a prominent central protrusion and moderate facet arthropathy with ligamentum flavum thickening resulting in severe spinal canal stenosis with compression of the cauda equina nerve roots and mild left neural foraminal stenosis. 3. Right subarticular zone protrusion at L2-L3 without evidence of impingement. 4. Levocurvature centered at L3.     Electronically Signed   By: Valetta Mole M.D.   On: 08/19/2022 12:36  I have personally reviewed the images and agree with the above interpretation.  Assessment and Plan: Ms. Pivonka is a pleasant 68 y.o. female with adjacent segment disease at L3-4.  She has moderate to severe stenosis at this level.  I think she is symptomatic from this.  She has not improved with physical therapy.  She is at the point where surgical intervention is the most appropriate neck step.  Due to the retrolisthesis of L3-4, I would normally supplement the anterior column with the lateral lumbar interbody fusion followed by extension of her instrumentation and fusion posteriorly.  She will need to discontinue using nicotine containing products to be able to move forward with this.  She will need to be off cigarettes for approximately 30 days before we will consider moving forward.  She has made substantial improvements in her nicotine use, but has not yet completely quit.  She will contact me when she is off nicotine products for 30 days and we will discuss her symptoms at that time and likely move forward with surgery..  I spent a total of 10 minutes in this patient's care today. This time was spent reviewing pertinent records including imaging studies, obtaining and  confirming history, performing a directed evaluation, formulating and discussing my recommendations, and documenting the visit within the medical record.    Thank you for involving me in the care of this patient.      Nazim Kadlec K. Izora Ribas MD, [facility]

## 2022-12-10 NOTE — Progress Notes (Signed)
Office Visit    Patient Name: Heather Beasley Date of Encounter: 12/11/2022  Primary Care Provider:  Eugenia Pancoast, [place] Primary Cardiologist:  Lauree Chandler, MD Primary Electrophysiologist: None  Chief Complaint    Heather Beasley is a 67 y.o. female with PMH of HTN, HLD, anxiety, GERD, prior tobacco abuse, carotid artery disease s/p carotid artery stenting, arthritis who presents today for elevated blood pressure and tachycardia.  Past Medical History    Past Medical History:  Diagnosis Date   Allergic rhinitis    Arthritis    Asthma    Cancer of nasal cavity and sinus ([place]) 07/07/2015   radiation   Cervical spondylosis    Colitis    Colon polyps    Depression    Diverticulosis    GERD (gastroesophageal reflux disease)    Glaucoma    Headaches, cluster    HTN (hypertension)    Hyperlipidemia    IBS (irritable bowel syndrome)    Kidney stones    Melanoma ([place])    above the L knee    Neuropathy    PONV (postoperative nausea and vomiting)    Tobacco abuse    Past Surgical History:  Procedure Laterality Date   [place]     CAROTID PTA/STENT INTERVENTION Left 06/27/2020   Procedure: CAROTID PTA/STENT INTERVENTION;  Surgeon: Algernon Huxley, MD;  Location: [place] [facility];  Service: Cardiovascular;  Laterality: Left;   CERVICAL FUSION  2010   CHOLECYSTECTOMY  08/2011   Dr Rochel Brome   COLONOSCOPY     Dr Tiffany Kocher   CYSTOSCOPY     Dr. Bernardo Heater   DILATION AND CURETTAGE OF UTERUS     ESOPHAGOGASTRODUODENOSCOPY  2012   Dr Tiffany Kocher   FRACTURE SURGERY  2001   plate in right wrist and arm   IMAGE GUIDED SINUS SURGERY N/A 06/25/2015   Procedure: IMAGE GUIDED SINUS SURGERY;  Surgeon: Beverly Gust, MD;  Location: [facility];  Service: ENT;  Laterality: N/A;   LUMBAR SPINE SURGERY  07/2004   OOPHORECTOMY     POLYPECTOMY N/A 06/25/2015   Procedure: POLYPECTOMY NASAL;  Surgeon: Beverly Gust, MD;  Location: [facility];  Service: ENT;  Laterality: N/A;   TONSILLECTOMY  1968   VAGINAL DELIVERY     x2    Allergies  Allergies  Allergen Reactions   Albumin (Human) Rash   Amoxicillin-Pot Clavulanate Hives   Gabapentin Shortness Of Breath   Latex Dermatitis and Rash   Betadine [Povidone Iodine] Dermatitis, Rash and Other (See Comments)    Skin burning Topical betadine and iodine have this reaction with patient.  IV contrast is NOT a problem.  jkl   Sucralfate     Other reaction(s): Other (See Comments) Abdominal Pain   Amoxicillin     REACTION: rash   Chantix [Varenicline Tartrate]     Heart racing   Erythromycin     REACTION: rash   Sucralfate Nausea And Vomiting    Heart racing Lightheaded     History of Present Illness    Heather Beasley  is a 67 year old female with the above mention past medical history who presents today for follow-up of elevated blood pressure and feeling her heart race at times.  She was seen recently by her PCP on 10/08/2023 with EKG in the office showing TWI in precordial leads as well as  aVR.  She was initially seen as a new patient by Dr. Angelena Form in 12/2021 following an ED visit for dyspnea.  CT of the chest was completed and was negative for PE with 2D echo showing moderate asymmetric septal hypertrophy with mild MR.  She had coronary CTA completed 01/26/2022 that showed mild coronary artery disease.  She was last seen by Dr. Angelena Form on 02/27/2022 for follow-up and patient denied any chest pain or syncope.  She was still having some dyspnea and trouble lying flat at night.  Her dyspnea was felt to be not cardiac in nature and she was started on Lasix 20 mg to see if her shortness of breath would resolve.  Since last being seen in the office patient reports she has been experiencing elevated blood pressures and mild headaches.  Home blood pressure logs show blood pressures anywhere from 99991111 systolically to 123XX123 diastolic.  She is compliant  with her current medication regimen severe side effects or missed doses.  She does endorse 1 episode of dizziness that occurred after taking her amlodipine with blood pressures dropping to the low 123XX123 systolically.  She also notes that her Lasix caused her to have frequent urination with no fluid replacement during that day.  During her visit today her blood pressure was initially elevated at 148/100 and was 144/82 on recheck.  She denies any severe sodium indiscretions but is not aware of salt content in most of her food.  We discussed the importance of regular exercise and following the DASH diet to help with decreasing BP numbers naturally.  She is eager to exercise however she is limited with back problems and neuropathy in her foot.  Patient denies chest pain, palpitations, dyspnea, PND, orthopnea, nausea, vomiting, dizziness, syncope, edema, weight gain, or early satiety.  Home Medications    Current Outpatient Medications  Medication Sig Dispense Refill   atorvastatin (LIPITOR) 20 MG tablet TAKE 1 TABLET BY MOUTH ONCE A DAY 90 tablet 3   Budeson-Glycopyrrol-Formoterol (BREZTRI AEROSPHERE) 160-9-4.8 MCG/ACT AERO Inhale 2 puffs into the lungs in the morning and at bedtime. 10.7 g 11   cetirizine (ZYRTEC) 10 MG tablet Take 10 mg by mouth daily. Allergies.     Cholecalciferol (VITAMIN D) 50 MCG (2000 UT) CAPS Take 2,000 Units by mouth.     clopidogrel (PLAVIX) 75 MG tablet TAKE 1 TABLET BY MOUTH ONCE A DAY 90 tablet 3   cyclobenzaprine (FLEXERIL) 10 MG tablet Take 1 tablet (10 mg total) by mouth 3 (three) times daily as needed for muscle spasms. 90 tablet 0   EPINEPHrine 0.3 mg/0.3 mL IJ SOAJ injection Inject 1 mg into the skin as needed.     Famotidine (PEPCID PO) Take 1 tablet by mouth at bedtime.     hydrochlorothiazide (MICROZIDE) 12.5 MG capsule Take 1 capsule (12.5 mg total) by mouth daily. 30 capsule 1   losartan (COZAAR) 100 MG tablet Take 1 tablet (100 mg total) by mouth daily. 90 tablet 0    montelukast (SINGULAIR) 10 MG tablet TAKE 1 TABLET BY MOUTH AT BEDTIME 90 tablet 3   Multiple Vitamins-Minerals (CENTRUM ADULTS PO) Take 1 tablet by mouth.     pantoprazole (PROTONIX) 40 MG tablet Take 1 tablet (40 mg total) by mouth 2 (two) times daily. (Patient taking differently: Take 40 mg by mouth daily.) 60 tablet 11   vitamin B-12 (CYANOCOBALAMIN) 1000 MCG tablet Take 1,000 mcg by mouth daily.     amLODipine (NORVASC) 5 MG tablet Take 1 tablet (5  mg total) by mouth at bedtime. 90 tablet 1   furosemide (LASIX) 20 MG tablet Take 1 tablet (20 mg total) by mouth as needed for fluid. 30 tablet 0   No current facility-administered medications for this visit.     Review of Systems  Please see the history of present illness.    (+) Headaches (+) Back pain, dizziness  All other systems reviewed and are otherwise negative except as noted above.  Physical Exam    Wt Readings from Last 3 Encounters:  12/11/22 187 lb 3.2 oz (84.9 kg)  12/10/22 183 lb 3.2 oz (83.1 kg)  12/08/22 188 lb 9.6 oz (85.5 kg)   VS: Vitals:   12/11/22 1120 12/11/22 1148  BP: (!) 148/100 (!) 144/82  Pulse: 86   SpO2: 97%   ,Body mass index is 31.15 kg/m.  Constitutional:      Appearance: Healthy appearance. Not in distress.  Neck:     Vascular: JVD normal.  Pulmonary:     Effort: Pulmonary effort is normal.     Breath sounds: No wheezing. No rales. Diminished in the bases Cardiovascular:     Normal rate. Regular rhythm. Normal S1. Normal S2.      Murmurs: There is no murmur.  Edema:    Peripheral edema absent.  Abdominal:     Palpations: Abdomen is soft non tender. There is no hepatomegaly.  Skin:    General: Skin is warm and dry.  Neurological:     General: No focal deficit present.     Mental Status: Alert and oriented to person, place and time.     Cranial Nerves: Cranial nerves are intact.  EKG/LABS/Other Studies Reviewed    ECG personally reviewed by me today -sinus rhythm with rate of  86 bpm and LVH TWI in leads aVL, V3, V5, V4 consistent with previous EKG.  Risk Assessment/Calculations:    Lab Results  Component Value Date   WBC 4.8 11/24/2022   HGB 14.3 11/24/2022   HCT 43.3 11/24/2022   MCV 101.1 (H) 11/24/2022   PLT 222.0 11/24/2022   Lab Results  Component Value Date   CREATININE 0.83 11/24/2022   BUN 13 11/24/2022   NA 142 11/24/2022   K 5.0 11/24/2022   CL 103 11/24/2022   CO2 32 11/24/2022   Lab Results  Component Value Date   ALT 11 04/14/2022   AST 17 04/14/2022   ALKPHOS 94 04/14/2022   BILITOT 0.8 04/14/2022   Lab Results  Component Value Date   CHOL 138 11/17/2021   HDL 52.50 11/17/2021   LDLCALC 66 11/17/2021   TRIG 97.0 11/17/2021   CHOLHDL 3 11/17/2021    No results found for: "HGBA1C"  Assessment & Plan    1.  Coronary artery disease: Cardiac CTA completed 01/2022 with mild coronary disease noted.  Most recent EKG showed sinus rhythm -Today patient denies any chest pain or exertional shortness of breath -She does note occasional episodes of headache that are correlated with elevated blood pressure. -Continue GDMT with Plavix 75 mg, Lipitor 20 mg daily  2.  Essential hypertension: -HYPERTENSION CONTROL Vitals:   12/11/22 1120 12/11/22 1148  BP: (!) 148/100 (!) 144/82    The patient's blood pressure is elevated above target today.  In order to address the patient's elevated BP: A current anti-hypertensive medication was adjusted today.; Blood pressure will be monitored at home to determine if medication changes need to be made.      -We will add HCTZ 12.5  mg daily -BMET in 2 weeks -Continue losartan 100 mg daily and amlodipine 5 mg daily at bedtime -Patient will continue to document blood pressures and submit log in 1 week -We will plan to refer to hypertension clinic if blood pressures remain elevated following medication adjustments.  3.  Mitral regurgitation: -2D echo completed 01/21/22 with LVEF=70-75%. Moderate  asymmetric septal hypertrophy. Mild mitral regurgitation.  -Continue as needed Lasix  4.  Shortness of breath: -Patient was recently started on Lasix 20 mg daily for complaint of shortness of breath. -We will change Lasix to as needed due to possible hypovolemia -Today she reports no shortness of breath or dizziness  Disposition: Follow-up with Lauree Chandler, MD or APP in 1 months    Medication Adjustments/Labs and Tests Ordered: Current medicines are reviewed at length with the patient today.  Concerns regarding medicines are outlined above.   Signed, Mable Fill, Marissa Nestle, NP 12/11/2022, 12:46 PM [facility]  Note:  This document was prepared using Dragon voice recognition software and may include unintentional dictation errors.

## 2022-12-11 ENCOUNTER — Ambulatory Visit: Payer: Medicare PPO | Attending: Nurse Practitioner | Admitting: Nurse Practitioner

## 2022-12-11 ENCOUNTER — Encounter: Payer: Self-pay | Admitting: Nurse Practitioner

## 2022-12-11 VITALS — BP 144/82 | HR 86 | Ht 65.0 in | Wt 187.2 lb

## 2022-12-11 DIAGNOSIS — I340 Nonrheumatic mitral (valve) insufficiency: Secondary | ICD-10-CM

## 2022-12-11 DIAGNOSIS — I10 Essential (primary) hypertension: Secondary | ICD-10-CM

## 2022-12-11 DIAGNOSIS — I2510 Atherosclerotic heart disease of native coronary artery without angina pectoris: Secondary | ICD-10-CM | POA: Diagnosis not present

## 2022-12-11 DIAGNOSIS — R0602 Shortness of breath: Secondary | ICD-10-CM | POA: Diagnosis not present

## 2022-12-11 DIAGNOSIS — I479 Paroxysmal tachycardia, unspecified: Secondary | ICD-10-CM | POA: Diagnosis not present

## 2022-12-11 MED ORDER — HYDROCHLOROTHIAZIDE 12.5 MG PO CAPS: 12.5000 mg | ORAL_CAPSULE | Freq: Every day | ORAL | 1 refills | Status: DC

## 2022-12-11 MED ORDER — FUROSEMIDE 20 MG PO TABS: 20.0000 mg | ORAL_TABLET | ORAL | 0 refills | Status: DC | PRN

## 2022-12-11 MED ORDER — AMLODIPINE BESYLATE 5 MG PO TABS: 5.0000 mg | ORAL_TABLET | Freq: Every day | ORAL | 1 refills | Status: DC

## 2022-12-11 NOTE — Patient Instructions (Addendum)
Medication Instructions:  CHANGE Lasix to as needed for 2 lb weight gain in a day or 5 lbs in a week START Hydrochlorothiazide 12.24m take 1 tablet once a day  RESTART Norvasc 533mTake 1 tablet once a day  *If you need a refill on your cardiac medications before your next appointment, please call your pharmacy*   Lab Work: 2 weeks-BMET If you have labs (blood work) drawn today and your tests are completely normal, you will receive your results only by: My[place]if you have MyChart) OR A paper copy in the mail If you have any lab test that is abnormal or we need to change your treatment, we will call you to review the results.   Testing/Procedures: NONE ORDERED   Follow-Up: [facility] your [facility] needs are our priority.  As part of our continuing mission to provide you with exceptional heart care, we have created designated Provider Care Teams.  These Care Teams include your primary Cardiologist (physician) and Advanced Practice Providers (APPs -  Physician Assistants and Nurse Practitioners) who all work together to provide you with the care you need, when you need it.  We recommend signing up for the patient portal called "MyChart".  Sign up information is provided on this After Visit Summary.  MyChart is used to connect with patients for Virtual Visits (Telemedicine).  Patients are able to view lab/test results, encounter notes, upcoming appointments, etc.  Non-urgent messages can be sent to your provider as well.   To learn more about what you can do with MyChart, go to [email or web address]   Your next appointment:   1 month(s)  Provider:   ErAmbrose PancoastNP       Other Instructions SILVER SNEAKERS Check your blood pressure daily for 2 weeks, then contact the office with your readings.

## 2022-12-14 DIAGNOSIS — J301 Allergic rhinitis due to pollen: Secondary | ICD-10-CM | POA: Diagnosis not present

## 2022-12-15 DIAGNOSIS — M5126 Other intervertebral disc displacement, lumbar region: Secondary | ICD-10-CM | POA: Diagnosis not present

## 2022-12-15 DIAGNOSIS — M5117 Intervertebral disc disorders with radiculopathy, lumbosacral region: Secondary | ICD-10-CM | POA: Diagnosis not present

## 2022-12-21 DIAGNOSIS — J301 Allergic rhinitis due to pollen: Secondary | ICD-10-CM | POA: Diagnosis not present

## 2022-12-22 DIAGNOSIS — M5117 Intervertebral disc disorders with radiculopathy, lumbosacral region: Secondary | ICD-10-CM | POA: Diagnosis not present

## 2022-12-22 DIAGNOSIS — M5126 Other intervertebral disc displacement, lumbar region: Secondary | ICD-10-CM | POA: Diagnosis not present

## 2022-12-25 ENCOUNTER — Ambulatory Visit: Payer: Medicare PPO | Attending: Nurse Practitioner

## 2022-12-25 DIAGNOSIS — I479 Paroxysmal tachycardia, unspecified: Secondary | ICD-10-CM

## 2022-12-25 DIAGNOSIS — I10 Essential (primary) hypertension: Secondary | ICD-10-CM

## 2022-12-25 DIAGNOSIS — R0602 Shortness of breath: Secondary | ICD-10-CM | POA: Diagnosis not present

## 2022-12-25 DIAGNOSIS — I340 Nonrheumatic mitral (valve) insufficiency: Secondary | ICD-10-CM | POA: Diagnosis not present

## 2022-12-25 DIAGNOSIS — I2510 Atherosclerotic heart disease of native coronary artery without angina pectoris: Secondary | ICD-10-CM | POA: Diagnosis not present

## 2022-12-26 LAB — BASIC METABOLIC PANEL
BUN/Creatinine Ratio: 20 (ref 12–28)
BUN: 18 mg/dL (ref 8–27)
CO2: 27 mmol/L (ref 20–29)
Calcium: 9.7 mg/dL (ref 8.7–10.3)
Chloride: 103 mmol/L (ref 96–106)
Creatinine, Ser: 0.91 mg/dL (ref 0.57–1.00)
Glucose: 96 mg/dL (ref 70–99)
Potassium: 4.8 mmol/L (ref 3.5–5.2)
Sodium: 143 mmol/L (ref 134–144)
eGFR: 70 mL/min/{1.73_m2} (ref >59)

## 2022-12-28 ENCOUNTER — Telehealth: Payer: Self-pay | Admitting: Nurse Practitioner

## 2022-12-28 DIAGNOSIS — J301 Allergic rhinitis due to pollen: Secondary | ICD-10-CM | POA: Diagnosis not present

## 2022-12-28 MED ORDER — HYDROCHLOROTHIAZIDE 25 MG PO TABS: 25.0000 mg | ORAL_TABLET | Freq: Every day | ORAL | Status: DC

## 2022-12-28 NOTE — Telephone Encounter (Signed)
Patient notified directly and voiced understanding.   Med list updated.

## 2022-12-28 NOTE — Addendum Note (Signed)
Addended by: Whitman Hero on: 12/28/2022 09:39 AM   Modules accepted: Orders

## 2022-12-28 NOTE — Telephone Encounter (Signed)
Please let [name] know that her blood pressures show a slight improvement but are still above goal.  We will have her increase HCTZ to 25 mg daily.  She will come in in 2 weeks for BMET.  Please continue to abstain from excess salt in your diet and check blood pressures twice daily.  Please let me know if you have any additional questions.  Ambrose Pancoast, NP

## 2023-01-04 DIAGNOSIS — J301 Allergic rhinitis due to pollen: Secondary | ICD-10-CM | POA: Diagnosis not present

## 2023-01-06 DIAGNOSIS — R040 Epistaxis: Secondary | ICD-10-CM | POA: Diagnosis not present

## 2023-01-06 DIAGNOSIS — Z8522 Personal history of malignant neoplasm of nasal cavities, middle ear, and accessory sinuses: Secondary | ICD-10-CM | POA: Diagnosis not present

## 2023-01-06 DIAGNOSIS — J340 Abscess, furuncle and carbuncle of nose: Secondary | ICD-10-CM | POA: Diagnosis not present

## 2023-01-06 DIAGNOSIS — J301 Allergic rhinitis due to pollen: Secondary | ICD-10-CM | POA: Diagnosis not present

## 2023-01-07 ENCOUNTER — Other Ambulatory Visit: Payer: Self-pay | Admitting: Orthopedic Surgery

## 2023-01-07 DIAGNOSIS — Z981 Arthrodesis status: Secondary | ICD-10-CM

## 2023-01-07 DIAGNOSIS — M5416 Radiculopathy, lumbar region: Secondary | ICD-10-CM

## 2023-01-07 DIAGNOSIS — M48061 Spinal stenosis, lumbar region without neurogenic claudication: Secondary | ICD-10-CM

## 2023-01-07 NOTE — Telephone Encounter (Signed)
Please let her know her flexeril was sent to the pharmacy.

## 2023-01-07 NOTE — Telephone Encounter (Signed)
Patient has been notified

## 2023-01-11 DIAGNOSIS — J301 Allergic rhinitis due to pollen: Secondary | ICD-10-CM | POA: Diagnosis not present

## 2023-01-11 NOTE — Progress Notes (Signed)
Office Visit    Patient Name: Heather Beasley Date of Encounter: 01/11/2023  Primary Care Provider:  Mort Sawyers, FNP Primary Cardiologist:  Verne Carrow, MD Primary Electrophysiologist: None  Chief Complaint    Heather Beasley is a 67 y.o. female with PMH of HTN, HLD, anxiety, GERD, prior tobacco abuse, carotid artery disease s/p carotid artery stenting, arthritis who presents today for 1 month follow-up of elevated blood pressure and tachycardia.  Past Medical History    Past Medical History:  Diagnosis Date   Allergic rhinitis    Arthritis    Asthma    Cancer of nasal cavity and sinus (HCC) 07/07/2015   radiation   Cervical spondylosis    Colitis    Colon polyps    Depression    Diverticulosis    GERD (gastroesophageal reflux disease)    Glaucoma    Headaches, cluster    HTN (hypertension)    Hyperlipidemia    IBS (irritable bowel syndrome)    Kidney stones    Melanoma (HCC)    above the L knee    Neuropathy    PONV (postoperative nausea and vomiting)    Tobacco abuse    Past Surgical History:  Procedure Laterality Date   ABDOMINAL HYSTERECTOMY  1998   APPENDECTOMY  1998   ARM FX REPAIR  2001   BACK SURGERY     CAROTID PTA/STENT INTERVENTION Left 06/27/2020   Procedure: CAROTID PTA/STENT INTERVENTION;  Surgeon: Annice Needy, MD;  Location: [facility];  Service: Cardiovascular;  Laterality: Left;   CERVICAL FUSION  2010   CHOLECYSTECTOMY  08/2011   Dr Renda Rolls   COLONOSCOPY     Dr Markham Jordan   CYSTOSCOPY     Dr. Lonna Cobb   DILATION AND CURETTAGE OF UTERUS     ESOPHAGOGASTRODUODENOSCOPY  2012   Dr Markham Jordan   FRACTURE SURGERY  2001   plate in right wrist and arm   IMAGE GUIDED SINUS SURGERY N/A 06/25/2015   Procedure: IMAGE GUIDED SINUS SURGERY;  Surgeon: Linus Salmons, MD;  Location: [facility];  Service: ENT;  Laterality: N/A;   LUMBAR SPINE SURGERY  07/2004   OOPHORECTOMY     POLYPECTOMY N/A 06/25/2015   Procedure: POLYPECTOMY  NASAL;  Surgeon: Linus Salmons, MD;  Location: [facility];  Service: ENT;  Laterality: N/A;   TONSILLECTOMY  1968   VAGINAL DELIVERY     x2    Allergies  Allergies  Allergen Reactions   Albumin (Human) Rash   Amoxicillin-Pot Clavulanate Hives   Gabapentin Shortness Of Breath   Latex Dermatitis and Rash   Betadine [Povidone Iodine] Dermatitis, Rash and Other (See Comments)    Skin burning Topical betadine and iodine have this reaction with patient.  IV contrast is NOT a problem.  jkl   Sucralfate     Other reaction(s): Other (See Comments) Abdominal Pain   Amoxicillin     REACTION: rash   Chantix [Varenicline Tartrate]     Heart racing   Erythromycin     REACTION: rash   Sucralfate Nausea And Vomiting    Heart racing Lightheaded     History of Present Illness    Heather Beasley  is a 67 year old female with the above mention past medical history who presents today for follow-up of elevated blood pressure and feeling her heart race at times.  She was seen recently by her PCP on 10/07/2022 with EKG in the office showing TWI in precordial  leads as well as aVR.  She was initially seen as a new patient by Dr. Clifton James in 12/2021 following an ED visit for dyspnea.  CT of the chest was completed and was negative for PE with 2D echo showing moderate asymmetric septal hypertrophy with mild MR.  She had coronary CTA completed 01/26/2022 that showed mild coronary artery disease.  She was last seen by Dr. Clifton James on 02/27/2022 for follow-up and patient denied any chest pain or syncope.  She was still having some dyspnea and trouble lying flat at night.  Her dyspnea was felt to be not cardiac in nature and she was started on Lasix 20 mg to see if her shortness of breath would resolve.  During her previous visit on 12/11/2022 her blood pressures were elevated and improved on recheck.  She was started on HCTZ 12.5 mg daily and Lasix was changed to as needed due to bouts of hypovolemia.   Heather Beasley  presents today for 1 month follow-up of blood pressure.  Since last being seen in the office she reports that her blood pressures have been improved high .  She does report some adverse reactions with HCTZ such as headache and dizziness.  She stopped taking her medication on Sunday due to these adverse reactions.  Her blood pressure today is well-controlled 124/86. We discussed the importance of contacting her provider prior to discontinuing medication.  She is planning to have back surgery in the next 2 months and has stopped smoking but is currently vaping.  She is currently not exercising due to neuropathy and back pain but did walk a long distance today prior to her visit in the parking lot without any difficulty.  Patient denies chest pain, palpitations, dyspnea, PND, orthopnea, nausea, vomiting, dizziness, syncope, edema, weight gain, or early satiety.  Home Medications    Current Outpatient Medications  Medication Sig Dispense Refill   hydrochlorothiazide (HYDRODIURIL) 25 MG tablet Take 1 tablet (25 mg total) by mouth daily. 30 tablet    amLODipine (NORVASC) 5 MG tablet Take 1 tablet (5 mg total) by mouth at bedtime. 90 tablet 1   atorvastatin (LIPITOR) 20 MG tablet TAKE 1 TABLET BY MOUTH ONCE A DAY 90 tablet 3   Budeson-Glycopyrrol-Formoterol (BREZTRI AEROSPHERE) 160-9-4.8 MCG/ACT AERO Inhale 2 puffs into the lungs in the morning and at bedtime. 10.7 g 11   cetirizine (ZYRTEC) 10 MG tablet Take 10 mg by mouth daily. Allergies.     Cholecalciferol (VITAMIN D) 50 MCG (2000 UT) CAPS Take 2,000 Units by mouth.     clopidogrel (PLAVIX) 75 MG tablet TAKE 1 TABLET BY MOUTH ONCE A DAY 90 tablet 3   cyclobenzaprine (FLEXERIL) 10 MG tablet TAKE ONE TABLET BY MOUTH THREE TIMES DAILY AS NEEDED FOR MUSCLE SPASMS 90 tablet 0   EPINEPHrine 0.3 mg/0.3 mL IJ SOAJ injection Inject 1 mg into the skin as needed.     Famotidine (PEPCID PO) Take 1 tablet by mouth at bedtime.     furosemide (LASIX) 20 MG tablet  Take 1 tablet (20 mg total) by mouth as needed for fluid. 30 tablet 0   losartan (COZAAR) 100 MG tablet Take 1 tablet (100 mg total) by mouth daily. 90 tablet 0   montelukast (SINGULAIR) 10 MG tablet TAKE 1 TABLET BY MOUTH AT BEDTIME 90 tablet 3   Multiple Vitamins-Minerals (CENTRUM ADULTS PO) Take 1 tablet by mouth.     pantoprazole (PROTONIX) 40 MG tablet Take 1 tablet (40 mg total) by mouth 2 (two)  times daily. (Patient taking differently: Take 40 mg by mouth daily.) 60 tablet 11   vitamin B-12 (CYANOCOBALAMIN) 1000 MCG tablet Take 1,000 mcg by mouth daily.     No current facility-administered medications for this visit.     Review of Systems  Please see the history of present illness.    (+) Dizziness (+) Back pain  All other systems reviewed and are otherwise negative except as noted above.  Physical Exam    Wt Readings from Last 3 Encounters:  12/11/22 187 lb 3.2 oz (84.9 kg)  12/10/22 183 lb 3.2 oz (83.1 kg)  12/08/22 188 lb 9.6 oz (85.5 kg)   ZO:XWRUE were no vitals filed for this visit.,There is no height or weight on file to calculate BMI.  Constitutional:      Appearance: Healthy appearance. Not in distress.  Neck:     Vascular: JVD normal.  Pulmonary:     Effort: Pulmonary effort is normal.     Breath sounds: No wheezing. No rales. Diminished in the bases Cardiovascular:     Normal rate. Regular rhythm. Normal S1. Normal S2.      Murmurs: There is no murmur.  Edema:    Peripheral edema absent.  Abdominal:     Palpations: Abdomen is soft non tender. There is no hepatomegaly.  Skin:    General: Skin is warm and dry.  Neurological:     General: No focal deficit present.     Mental Status: Alert and oriented to person, place and time.     Cranial Nerves: Cranial nerves are intact.   EKG/LABS/ Recent Cardiac Studies    ECG personally reviewed by me today -none completed today  Lab Results  Component Value Date   WBC 4.8 11/24/2022   HGB 14.3 11/24/2022    HCT 43.3 11/24/2022   MCV 101.1 (H) 11/24/2022   PLT 222.0 11/24/2022   Lab Results  Component Value Date   CREATININE 0.91 12/25/2022   BUN 18 12/25/2022   NA 143 12/25/2022   K 4.8 12/25/2022   CL 103 12/25/2022   CO2 27 12/25/2022   Lab Results  Component Value Date   ALT 11 04/14/2022   AST 17 04/14/2022   ALKPHOS 94 04/14/2022   BILITOT 0.8 04/14/2022   Lab Results  Component Value Date   CHOL 138 11/17/2021   HDL 52.50 11/17/2021   LDLCALC 66 11/17/2021   TRIG 97.0 11/17/2021   CHOLHDL 3 11/17/2021    No results found for: "HGBA1C"  Cardiac Studies & Procedures       ECHOCARDIOGRAM  ECHOCARDIOGRAM COMPLETE 01/21/2022  Narrative ECHOCARDIOGRAM REPORT    Patient Name:   JULIANAH ERCOLANI Date of Exam: 01/21/2022 Medical Rec #:  [id number]       Height:       65.0 in Accession #:    [id number]      Weight:       177.0 lb Date of Birth:  [birth year]       BSA:          1.878 m Patient Age:    65 years        BP:           122/80 mmHg Patient Gender: F               HR:           75 bpm. Exam Location:  [facility]  Procedure: 2D Echo, Cardiac Doppler and Color Doppler  Indications:  R07.9 Chest pain, R06.02 shortness of breath, R79.89 elevated bnp  History:         Patient has no prior history of Echocardiogram examinations. Risk Factors:Current Smoker, Hypertension and Dyslipidemia.  Sonographer:     Ceasar Mons Referring Phys:  [id number] TABITHA DUGAL Diagnosing Phys: Lorine Bears MD  IMPRESSIONS   1. Left ventricular ejection fraction, by estimation, is 70 to 75%. The left ventricle has hyperdynamic function. The left ventricle has no regional wall motion abnormalities. There is moderate asymmetric left ventricular hypertrophy of the septal segment. Left ventricular diastolic parameters are consistent with Grade I diastolic dysfunction (impaired relaxation). 2. Right ventricular systolic function is normal. The right ventricular size is normal.  Tricuspid regurgitation signal is inadequate for assessing PA pressure. 3. The mitral valve is normal in structure. Mild mitral valve regurgitation. No evidence of mitral stenosis. 4. The aortic valve is normal in structure. Aortic valve regurgitation is not visualized. No aortic stenosis is present. 5. The inferior vena cava is normal in size with greater than 50% respiratory variability, suggesting right atrial pressure of 3 mmHg.  FINDINGS Left Ventricle: Left ventricular ejection fraction, by estimation, is 70 to 75%. The left ventricle has hyperdynamic function. The left ventricle has no regional wall motion abnormalities. The left ventricular internal cavity size was normal in size. There is moderate asymmetric left ventricular hypertrophy of the septal segment. Left ventricular diastolic parameters are consistent with Grade I diastolic dysfunction (impaired relaxation).  Right Ventricle: The right ventricular size is normal. No increase in right ventricular wall thickness. Right ventricular systolic function is normal. Tricuspid regurgitation signal is inadequate for assessing PA pressure.  Left Atrium: Left atrial size was normal in size.  Right Atrium: Right atrial size was normal in size.  Pericardium: There is no evidence of pericardial effusion.  Mitral Valve: The mitral valve is normal in structure. There is mild thickening of the mitral valve leaflet(s). Mild mitral valve regurgitation. No evidence of mitral valve stenosis. MV peak gradient, 6.7 mmHg. The mean mitral valve gradient is 2.0 mmHg.  Tricuspid Valve: The tricuspid valve is normal in structure. Tricuspid valve regurgitation is not demonstrated. No evidence of tricuspid stenosis.  Aortic Valve: The aortic valve is normal in structure. Aortic valve regurgitation is not visualized. No aortic stenosis is present. Aortic valve mean gradient measures 4.0 mmHg. Aortic valve peak gradient measures 6.1 mmHg. Aortic valve area, by  VTI measures 2.62 cm.  Pulmonic Valve: The pulmonic valve was normal in structure. Pulmonic valve regurgitation is trivial. No evidence of pulmonic stenosis.  Aorta: The aortic root is normal in size and structure.  Venous: The inferior vena cava is normal in size with greater than 50% respiratory variability, suggesting right atrial pressure of 3 mmHg.  IAS/Shunts: No atrial level shunt detected by color flow Doppler.   LEFT VENTRICLE PLAX 2D LVIDd:         4.43 cm   Diastology LVIDs:         2.55 cm   LV e' medial:    4.46 cm/s LV PW:         1.15 cm   LV E/e' medial:  22.6 LV IVS:        1.77 cm   LV e' lateral:   5.55 cm/s LVOT diam:     1.90 cm   LV E/e' lateral: 18.2 LV SV:         67 LV SV Index:   36 LVOT Area:  2.84 cm   RIGHT VENTRICLE RV Basal diam:  3.17 cm RV S prime:     15.60 cm/s  LEFT ATRIUM             Index        RIGHT ATRIUM          Index LA diam:        3.30 cm 1.76 cm/m   RA Area:     9.62 cm LA Vol (A2C):   45.4 ml 24.17 ml/m  RA Volume:   19.10 ml 10.17 ml/m LA Vol (A4C):   21.6 ml 11.50 ml/m LA Biplane Vol: 33.9 ml 18.05 ml/m AORTIC VALVE                    PULMONIC VALVE AV Area (Vmax):    2.72 cm     PV Vmax:       1.13 m/s AV Area (Vmean):   2.49 cm     PV Vmean:      73.900 cm/s AV Area (VTI):     2.62 cm     PV VTI:        0.210 m AV Vmax:           123.00 cm/s  PV Peak grad:  5.1 mmHg AV Vmean:          93.400 cm/s  PV Mean grad:  3.0 mmHg AV VTI:            0.258 m AV Peak Grad:      6.1 mmHg AV Mean Grad:      4.0 mmHg LVOT Vmax:         118.00 cm/s LVOT Vmean:        82.000 cm/s LVOT VTI:          0.238 m LVOT/AV VTI ratio: 0.92  AORTA Ao Root diam: 3.00 cm  MITRAL VALVE MV Area (PHT): 3.61 cm     SHUNTS MV Area VTI:   2.02 cm     Systemic VTI:  0.24 m MV Peak grad:  6.7 mmHg     Systemic Diam: 1.90 cm MV Mean grad:  2.0 mmHg MV Vmax:       1.29 m/s MV Vmean:      73.2 cm/s MV Decel Time: 210 msec MV E  velocity: 101.00 cm/s MV A velocity: 122.00 cm/s MV E/A ratio:  0.83  Lorine Bears MD Electronically signed by Lorine Bears MD Signature Date/Time: 01/21/2022/4:21:38 PM    Final     CT SCANS  CT CORONARY MORPH W/CTA COR W/SCORE 01/26/2022  Addendum 01/26/2022  6:03 PM ADDENDUM REPORT: 01/26/2022 18:01  CLINICAL DATA:  67M with carotid stenosis, hypertension, hyperlipidemia, GERD and prior tobacco abuse with dyspnea.  EXAM: Cardiac/Coronary  CT  TECHNIQUE: The patient was scanned on a [employer].  FINDINGS: A 120 kV prospective scan was triggered in the descending thoracic aorta at 111 HU's. Axial non-contrast 3 mm slices were carried out through the heart. The data set was analyzed on a dedicated work station and scored using the Agatson method. Gantry rotation speed was 250 msecs and collimation was .6 mm. No beta blockade and 0.8 mg of sl NTG was given. The 3D data set was reconstructed in 5% intervals of the 67-82 % of the R-R cycle. Diastolic phases were analyzed on a dedicated work station using MPR, MIP and VRT modes. The patient received 80 cc of contrast.  Aorta: Normal size. Ascending aorta  3.2 cm. Aortic atherosclerosis. No dissection.  Aortic Valve:  Trileaflet.  No calcifications.  Coronary Arteries:  Normal coronary origin.  Right dominance.  RCA is a large dominant artery that gives rise to PDA and two PLV branches. There is no plaque.  Left main is a large artery that gives rise to LAD and LCX arteries.  LAD is a large vessel that has minimal (<25%) calcified plaque proximally.  LCX is a non-dominant artery that has minimal (<25%) calcified plaque. It gives rise to large OM1 and OM2 branches with minimal plaque in OM1. There is no plaque.  Coronary Calcium Score:  Left main: 0  Left anterior descending artery: 80.3  Left circumflex artery: 0  Right coronary artery: 0  Total: 80.3  Percentile: 82nd  Other  findings:  Normal pulmonary vein drainage into the left atrium.  Normal let atrial appendage without a thrombus.  Normal size of the pulmonary artery.  PFO with left to right contrast flow.  IMPRESSION: 1. Coronary calcium score of 80.3. This was 82nd percentile for age-, race-, and sex-matched controls.  2. Normal coronary origin with right dominance.  3. There is minimal plaque in the LAD and left circumflex arteries. CAD-RADS1.  2.  PFO with left to right contrast flow.  Chilton Si, MD   Electronically Signed By: Chilton Si M.D. On: 01/26/2022 18:01  Narrative EXAM: OVER-READ INTERPRETATION  CT CHEST  The following report is an over-read performed by radiologist Dr. Caprice Renshaw of [facility] on 01/26/2022. This over-read does not include interpretation of cardiac or coronary anatomy or pathology. The coronary CTA interpretation by the cardiologist is attached.  COMPARISON:  Chest CT 01/14/2022  FINDINGS: Vascular: Coronary artery calcifications. No additional vascular findings.  Mediastinum/Nodes: No lymphadenopathy.  Lungs/Pleura: Unchanged 8 mm left lower lobe ground-glass nodule (series 12, image 48). Known right upper lobe nodule is not within the field of view. No new nodules. There is mild mucous plugging in the right lower lobe.  Upper Abdomen: No acute findings.  Musculoskeletal: No acute osseous abnormality. No suspicious lytic or blastic lesions.  IMPRESSION: Unchanged 8 mm left lower lobe ground-glass nodule. Recommend continued follow-up as recommended on prior chest CT on 01/14/2022.  Mild mucous plugging in the right lower lobe. No focal airspace disease.  Interpretation of cardiac/coronary anatomy by cardiology to follow.  Electronically Signed: By: Caprice Renshaw M.D. On: 01/26/2022 14:10          Assessment & Plan     1.  Coronary artery disease: Cardiac CTA completed 01/2022 with mild coronary disease  noted.   -Today patient reports that she has not experienced any chest pain or shortness of breath since previous visit. -Continue GDMT with Plavix 75 mg, Lipitor 20 mg daily   2.  Essential hypertension:   -Patient's blood pressure today is well-controlled at 124/86.  She does report elevated blood pressures in the morning in the 140s systolically. -She also reports ongoing dizziness and possible hypovolemia.  We will stop HCTZ 25 mg today -Continue losartan 100 mg daily we will increase amlodipine to 5 mg in the morning and 5 mg in the evening for total of 10 mg. -BMET today -We will plan to refer to hypertension clinic if blood pressures remain elevated following medication adjustments.   3.  Mitral regurgitation: -2D echo completed 01/21/22 with LVEF=70-75%. Moderate asymmetric septal hypertrophy. Mild mitral regurgitation.  -Continue as needed Lasix   4.  Shortness of breath: -Patient was recently started  on Lasix 20 mg daily for complaint of shortness of breath. -We will change Lasix to as needed due to possible hypovolemia -Today she reports no shortness of breath or dizziness  5.  Preoperative clearance:  -Heather Beasley is planning to undergo back surgery in the next 2 months. She has been doing well without any new cardiac symptoms. They are able to achieve 5 METS without cardiac limitations. Therefore, based on ACC/AHA guidelines, the patient would be at acceptable risk for the planned procedure without further cardiovascular testing. The patient was advised that if she develops new symptoms prior to surgery to contact our office to arrange for a follow-up visit, and she verbalized understanding.   Disposition: Follow-up with Verne Carrow, MD or APP in 6 months     Medication Adjustments/Labs and Tests Ordered: Current medicines are reviewed at length with the patient today.  Concerns regarding medicines are outlined above.   Signed, Napoleon Form, Leodis Rains, NP 01/11/2023,  11:10 AM [facility]  Note:  This document was prepared using Dragon voice recognition software and may include unintentional dictation errors.

## 2023-01-12 ENCOUNTER — Ambulatory Visit: Payer: Medicare PPO | Attending: Nurse Practitioner | Admitting: Nurse Practitioner

## 2023-01-12 ENCOUNTER — Encounter: Payer: Self-pay | Admitting: Nurse Practitioner

## 2023-01-12 VITALS — BP 124/86 | HR 87 | Ht 65.0 in | Wt 192.4 lb

## 2023-01-12 DIAGNOSIS — J439 Emphysema, unspecified: Secondary | ICD-10-CM | POA: Diagnosis not present

## 2023-01-12 DIAGNOSIS — I10 Essential (primary) hypertension: Secondary | ICD-10-CM

## 2023-01-12 DIAGNOSIS — I340 Nonrheumatic mitral (valve) insufficiency: Secondary | ICD-10-CM | POA: Diagnosis not present

## 2023-01-12 DIAGNOSIS — I2510 Atherosclerotic heart disease of native coronary artery without angina pectoris: Secondary | ICD-10-CM

## 2023-01-12 DIAGNOSIS — R0602 Shortness of breath: Secondary | ICD-10-CM

## 2023-01-12 DIAGNOSIS — Z01810 Encounter for preprocedural cardiovascular examination: Secondary | ICD-10-CM

## 2023-01-12 MED ORDER — AMLODIPINE BESYLATE 5 MG PO TABS: ORAL_TABLET | ORAL | 3 refills | Status: DC

## 2023-01-12 MED ORDER — MONTELUKAST SODIUM 10 MG PO TABS: 10.0000 mg | ORAL_TABLET | Freq: Every day | ORAL | 3 refills | Status: DC

## 2023-01-12 NOTE — Patient Instructions (Signed)
Medication Instructions:  STOP Hydrochlorothiazide INCREASE Amlodipine to 10mg  daily: 5mg  each morning and 5mg  in the evening *If you need a refill on your cardiac medications before your next appointment, please call your pharmacy*   Lab Work: BMET today If you have labs (blood work) drawn today and your tests are completely normal, you will receive your results only by: [place] (if you have MyChart) OR A paper copy in the mail If you have any lab test that is abnormal or we need to change your treatment, we will call you to review the results.   Testing/Procedures: **Please monitor BP daily at home two hours after taking medication and send readings via MyChart or call office with log in 2 weeks**   Follow-Up: At [facility], you and your health needs are our priority.  As part of our continuing mission to provide you with exceptional heart care, we have created designated Provider Care Teams.  These Care Teams include your primary Cardiologist (physician) and Advanced Practice Providers (APPs -  Physician Assistants and Nurse Practitioners) who all work together to provide you with the care you need, when you need it.  Your next appointment:   6 month(s)  Provider:   Ambrose Pancoast, NP

## 2023-01-13 ENCOUNTER — Other Ambulatory Visit: Payer: Self-pay

## 2023-01-13 DIAGNOSIS — R0602 Shortness of breath: Secondary | ICD-10-CM

## 2023-01-13 DIAGNOSIS — I340 Nonrheumatic mitral (valve) insufficiency: Secondary | ICD-10-CM

## 2023-01-13 DIAGNOSIS — I10 Essential (primary) hypertension: Secondary | ICD-10-CM

## 2023-01-13 DIAGNOSIS — I2510 Atherosclerotic heart disease of native coronary artery without angina pectoris: Secondary | ICD-10-CM

## 2023-01-13 LAB — BASIC METABOLIC PANEL
BUN/Creatinine Ratio: 16 (ref 12–28)
BUN: 17 mg/dL (ref 8–27)
CO2: 26 mmol/L (ref 20–29)
Calcium: 9.7 mg/dL (ref 8.7–10.3)
Chloride: 103 mmol/L (ref 96–106)
Creatinine, Ser: 1.04 mg/dL — ABNORMAL HIGH (ref 0.57–1.00)
Glucose: 97 mg/dL (ref 70–99)
Potassium: 5.1 mmol/L (ref 3.5–5.2)
Sodium: 143 mmol/L (ref 134–144)
eGFR: 60 mL/min/{1.73_m2} (ref >59)

## 2023-01-15 ENCOUNTER — Ambulatory Visit
Admission: RE | Admit: 2023-01-15 | Discharge: 2023-01-15 | Disposition: A | Discharge status: Home / Self Care | Payer: Medicare PPO | Source: Ambulatory Visit | Attending: Family | Admitting: Family

## 2023-01-15 DIAGNOSIS — F17210 Nicotine dependence, cigarettes, uncomplicated: Secondary | ICD-10-CM | POA: Insufficient documentation

## 2023-01-15 DIAGNOSIS — Z87891 Personal history of nicotine dependence: Secondary | ICD-10-CM

## 2023-01-18 DIAGNOSIS — J301 Allergic rhinitis due to pollen: Secondary | ICD-10-CM | POA: Diagnosis not present

## 2023-01-21 ENCOUNTER — Encounter: Payer: Self-pay | Admitting: Neurosurgery

## 2023-01-21 NOTE — Telephone Encounter (Signed)
Did you offer her a telephone visit? Would you rather see her in person?

## 2023-01-25 DIAGNOSIS — J301 Allergic rhinitis due to pollen: Secondary | ICD-10-CM | POA: Diagnosis not present

## 2023-01-28 ENCOUNTER — Ambulatory Visit: Payer: Medicare PPO | Admitting: Pulmonary Disease

## 2023-01-28 ENCOUNTER — Encounter: Payer: Self-pay | Admitting: Pulmonary Disease

## 2023-01-28 VITALS — BP 130/84 | HR 86 | Temp 97.1°F | Ht 65.0 in | Wt 192.4 lb

## 2023-01-28 DIAGNOSIS — I422 Other hypertrophic cardiomyopathy: Secondary | ICD-10-CM | POA: Diagnosis not present

## 2023-01-28 DIAGNOSIS — G4710 Hypersomnia, unspecified: Secondary | ICD-10-CM | POA: Diagnosis not present

## 2023-01-28 DIAGNOSIS — Z87891 Personal history of nicotine dependence: Secondary | ICD-10-CM

## 2023-01-28 DIAGNOSIS — M48061 Spinal stenosis, lumbar region without neurogenic claudication: Secondary | ICD-10-CM

## 2023-01-28 DIAGNOSIS — M5136 Other intervertebral disc degeneration, lumbar region: Secondary | ICD-10-CM | POA: Diagnosis not present

## 2023-01-28 DIAGNOSIS — R0602 Shortness of breath: Secondary | ICD-10-CM

## 2023-01-28 DIAGNOSIS — J449 Chronic obstructive pulmonary disease, unspecified: Secondary | ICD-10-CM | POA: Diagnosis not present

## 2023-01-28 DIAGNOSIS — Z7289 Other problems related to lifestyle: Secondary | ICD-10-CM | POA: Diagnosis not present

## 2023-01-28 DIAGNOSIS — R911 Solitary pulmonary nodule: Secondary | ICD-10-CM

## 2023-01-28 NOTE — Progress Notes (Signed)
Subjective:    Patient ID: Heather Beasley, female    DOB: 1956/02/08, 67 y.o.   MRN: [id number] Patient Care Team: Eugenia Pancoast, [place] as PCP - General (Family Medicine) Burnell Blanks, MD as PCP - Cardiology (Cardiology) Noreene Filbert, MD as Referring Physician (Radiation Oncology) Beverly Gust, MD (Otolaryngology) Meade Maw, MD as Consulting Physician (Neurosurgery)  Chief Complaint  Patient presents with   Follow-up    SOB with exertion. No wheezing. Dry cough.    HPI Patient is a 67 year old former cigarette smoker (quit 2/24, 43 PY) with a history as noted below, who presents for follow-up on the issue of dyspnea and COPD.  Patient was last seen on 30 July 2022 at that time she was instructed to continue [place].  Since that visit she has not had any exacerbations.  She has baseline dyspnea on exertion.  No wheezing.  She has occasional dry cough.  No sputum production no hemoptysis.  She quit smoking on 19 December 2022 but now is vaping not on nicotine substance.  She notes that Judithann Sauger helps with her breathing significantly.  He has chronic 3 pillow orthopnea and this is of longstanding.  She has issues with hypertension and is followed by cardiology.  She has been evaluated by neurosurgery due to chronic back issues.  Lumbar stenosis and will need surgery.  She has gotten herself off of nicotine containing substances so that she can undergo surgery.  She had issues with hypersomnolence and underwent sleep study in July 2023.  This did not show any sleep disordered breathing i.e. no sleep apnea.  Since that time she has noted that her hypersomnolence is getting better.  She has not had any chest pain.  No paroxysmal nocturnal dyspnea.  As noted she has chronic 3 pillow orthopnea.  This has not changed in character.  She has not had any calf tenderness.  No lower extremity edema.  DATA 01/14/2022 CT chest lung cancer screening: Lung RADS 2, 8 mm groundglass  nodule left lower lobe, emphysema, coronary calcifications noted 01/21/2022 echocardiogram: LVEF 70 to 75%, hyperdynamic LV, no wall motion abnormalities, moderate ASH and grade 1 DD.  Mild mitral valve regurgitation 01/26/2022 coronary CTA: Minimal coronary artery disease, PFO with left-to-right contrast flow 03/05/2022 PFTs: FEV1 1.60 L or 62% predicted, FVC 2.52 L or 75% predicted, FEV1/FVC 64%, no bronchodilator response, lung volumes normal.  Diffusion capacity moderately reduced, consistent with moderately severe obstructive airways disease, moderate diffusion impairment, probable concomitant restriction likely on the basis of obesity 05/01/2022 sleep study (in lab): No indication of sleep disordered breathing.  Consider other etiologies of somnolence. 01/15/2023 CT chest lung cancer screening: Lung RADS 2, 8 mm groundglass nodule on the left lower lobe unchanged, emphysema, coronary calcifications.  Review of Systems A 10 point review of systems was performed and it is as noted above otherwise negative.  Patient Active Problem List   Diagnosis Date Noted   Lumbar spinal stenosis due to adjacent segment disease after fusion procedure (L4-S1 fusion, Left L3/4) 11/11/2022   Lumbar post-laminectomy syndrome 11/11/2022   Chronic pain syndrome 11/11/2022   Sacroiliac joint pain 11/11/2022   Lumbar radiculopathy 07/17/2022   History of left foot drop 07/17/2022   Vitamin B12 deficiency 04/14/2022   Snoring 01/20/2022   DOE (dyspnea on exertion) 01/08/2022   Elevated brain natriuretic peptide (BNP) level 01/08/2022   Pulmonary emphysema 01/08/2022   Pulmonary nodule less than 6 mm determined by computed tomography of lung 01/08/2022   Other  fatigue 01/08/2022   Family history of breast cancer in female 11/17/2021   Carotid stenosis, left 06/27/2020   Primary osteoarthritis involving multiple joints 03/01/2018   Osteopenia 02/14/2018   HLD (hyperlipidemia) 12/01/2016   Irritable bowel  syndrome with diarrhea 12/01/2016   Cancer of nasal cavity and sinus 07/07/2015   Essential hypertension, benign 08/08/2013   Mixed urge and stress incontinence 03/27/2013   Allergic rhinitis 12/13/2012   Chronic headaches 02/22/2012   GERD (gastroesophageal reflux disease) 02/22/2012   Vitamin D deficiency 01/04/2012   Social History   Tobacco Use   Smoking status: Former    Packs/day: 1.00    Years: 39.00    Additional pack years: 0.00    Total pack years: 39.00    Types: Cigarettes    Quit date: 12/19/2022    Years since quitting: 0.1   Smokeless tobacco: Never   Tobacco comments:    Using vape  Substance Use Topics   Alcohol use: Not Currently   Allergies  Allergen Reactions   Albumin (Human) Rash   Amoxicillin-Pot Clavulanate Hives   Gabapentin Shortness Of Breath   Latex Dermatitis and Rash   Betadine [Povidone Iodine] Dermatitis, Rash and Other (See Comments)    Skin burning Topical betadine and iodine have this reaction with patient.  IV contrast is NOT a problem.  jkl   Sucralfate     Other reaction(s): Other (See Comments) Abdominal Pain   Amoxicillin     REACTION: rash   Chantix [Varenicline Tartrate]     Heart racing   Erythromycin     REACTION: rash   Sucralfate Nausea And Vomiting    Heart racing Lightheaded    Current Meds  Medication Sig   amLODipine (NORVASC) 5 MG tablet Take 1 tablet by mouth each morning and one tablet in the evening (total daily dose of 10mg )   atorvastatin (LIPITOR) 20 MG tablet TAKE 1 TABLET BY MOUTH ONCE A DAY   Budeson-Glycopyrrol-Formoterol (BREZTRI AEROSPHERE) 160-9-4.8 MCG/ACT AERO Inhale 2 puffs into the lungs in the morning and at bedtime.   cetirizine (ZYRTEC) 10 MG tablet Take 10 mg by mouth daily. Allergies.   Cholecalciferol (VITAMIN D) 50 MCG (2000 UT) CAPS Take 2,000 Units by mouth.   clopidogrel (PLAVIX) 75 MG tablet TAKE 1 TABLET BY MOUTH ONCE A DAY   cyclobenzaprine (FLEXERIL) 10 MG tablet Take 10 mg by  mouth at bedtime. Pt takes 1 tablet at night.   EPINEPHrine 0.3 mg/0.3 mL IJ SOAJ injection Inject 1 mg into the skin as needed.   Famotidine (PEPCID PO) Take 1 tablet by mouth at bedtime.   furosemide (LASIX) 20 MG tablet Take 1 tablet (20 mg total) by mouth as needed for fluid.   losartan (COZAAR) 100 MG tablet Take 1 tablet (100 mg total) by mouth daily.   montelukast (SINGULAIR) 10 MG tablet Take 1 tablet (10 mg total) by mouth at bedtime.   Multiple Vitamins-Minerals (CENTRUM ADULTS PO) Take 1 tablet by mouth.   pantoprazole (PROTONIX) 40 MG tablet Take 1 tablet (40 mg total) by mouth 2 (two) times daily. (Patient taking differently: Take 40 mg by mouth daily.)   vitamin B-12 (CYANOCOBALAMIN) 1000 MCG tablet Take 1,000 mcg by mouth daily.   Immunization History  Administered Date(s) Administered   Influenza Split 07/27/2011, 08/25/2012   Influenza Whole 09/14/2007   Influenza,inj,Quad PF,6+ Mos 08/10/2013, 08/01/2015, 07/16/2016, 08/02/2018, 07/15/2019   Influenza,inj,quad, With Preservative 08/13/2017   Influenza-Unspecified 07/26/2021, 08/05/2022   Pneumococcal Conjugate-13 07/16/2016  Pneumococcal Polysaccharide-23 11/27/2011, 03/01/2018       Objective:   Physical Exam BP 130/84 (BP Location: Left Arm, Cuff Size: Normal)   Pulse 86   Temp (!) 97.1 F (36.2 C)   Ht 5\' 5"  (1.651 m)   Wt 192 lb 6.4 oz (87.3 kg)   SpO2 97%   BMI 32.02 kg/m   SpO2: 97 % O2 Device: None (Room air)  GENERAL: Overweight woman, no acute distress, fully ambulatory.  No conversational dyspnea.  Nasal quality to the speech. HEAD: Normocephalic, atraumatic.  EYES: Pupils equal, round, reactive to light.  No scleral icterus.  MOUTH: Class III airway.  No thrush. NECK: Supple. No thyromegaly. Trachea midline. No JVD.  No adenopathy. PULMONARY: Good air entry bilaterally.  Coarse otherwise, no adventitious sounds. CARDIOVASCULAR: S1 and S2. Regular rate and rhythm.  Grade 2/6 systolic ejection  murmur which increases with Valsalva.  No gallop. ABDOMEN: Benign. MUSCULOSKELETAL: No joint deformity, no clubbing, no edema.  NEUROLOGIC: Chronic left foot drop.  This affects gait.  No other localizing finding. SKIN: Intact,warm,dry.  Nails are short (patient is a nail biter). PSYCH: Mood and behavior normal.    Representative image from CT performed 15 January 2023 showing stable 8 mm groundglass nodule in the left lower lobe     Assessment & Plan:     ICD-10-CM   1. Stage 2 moderate COPD by GOLD classification  J44.9    Appears well compensated Continue Breztri 2 puffs twice a day Continue as needed albuterol    2. Shortness of breath  R06.02    Markedly improved on Breztri Other factors:ASH, deconditioning    3. Asymmetric septal hypertrophy  I42.2    This issue adds complexity to her management She follows with cardiology    4. Lumbar spinal stenosis due to adjacent segment disease after fusion procedure (L4-S1 fusion, Left L3/4)  M48.061    M51.36    She is to have surgery by Dr. Cari Caraway    5. Nodule of lower lobe of left lung  R91.1    8 mm, unchanged Subsolid    6. Hypersomnolence  G47.10    Resolved No evidence of sleep apnea with sleep study 05/01/2022    7. Vapes non-nicotine containing substance  Z72.89    Not smoking cigarette it is Using nonnicotine containing vape    8. Former heavy cigarette smoker (20-39 per day)  Z87.891    Enrolled in lung cancer screening     Patient is well compensated.  She is currently on abstaining from all nicotine containing products.  Will see her in follow-up in 3 to 4 months time she is to call sooner should any new problems arise.  Renold Don, MD Advanced Bronchoscopy [facility]    *This note was dictated using voice recognition software/Dragon.  Despite best efforts to proofread, errors can occur which can change the meaning. Any transcriptional errors that result from this process  are unintentional and may not be fully corrected at the time of dictation.

## 2023-01-28 NOTE — Patient Instructions (Addendum)
We discussed your lung cancer screening CT.  The scan did not show any new issues to be addressed.  Continue using your Breztri 2 puffs twice a day.  Continue as needed albuterol.  We will see you in follow-up in 3 to 4 months time call sooner should any new problems arise.

## 2023-02-01 DIAGNOSIS — J301 Allergic rhinitis due to pollen: Secondary | ICD-10-CM | POA: Diagnosis not present

## 2023-02-02 ENCOUNTER — Ambulatory Visit (INDEPENDENT_AMBULATORY_CARE_PROVIDER_SITE_OTHER): Payer: Medicare PPO | Admitting: Neurosurgery

## 2023-02-02 DIAGNOSIS — M5136 Other intervertebral disc degeneration, lumbar region: Secondary | ICD-10-CM | POA: Diagnosis not present

## 2023-02-02 DIAGNOSIS — M4316 Spondylolisthesis, lumbar region: Secondary | ICD-10-CM | POA: Diagnosis not present

## 2023-02-02 DIAGNOSIS — Z981 Arthrodesis status: Secondary | ICD-10-CM

## 2023-02-02 NOTE — Progress Notes (Signed)
Referring Physician:  No referring provider defined for this encounter.  Primary Physician:  Mort Sawyers, FNP  History of Present Illness: 02/02/2023 Heather Beasley presents today for telephone visit.  She has continued symptoms.  She has been off of all nicotine containing products for at least 30 days.  She is vaping but with nonnicotine containing fluid.  12/10/2022 Heather Beasley returns to see me she continues to smoke 2 cigarettes/day.  She has had some significant issues with her blood pressure.  She is seeing her cardiologist tomorrow.  She has been doing physical therapy without improvement.  10/15/2022 Heather Beasley is here today with a chief complaint of low back pain that radiates into her right hip. She saw [facility] for hip and on 10/09/22 was put on a medrol dosepak which has not seemed to help.  She has been having trouble for approximately 2 months.  She reports sharp and throbbing pain into her right hip.  She has pain around her right knee when she stands or bends.  Walking makes it worse.  Sitting and resting makes it better.  Her pain has been worsening over time and is now interfering with her day-to-day life.   Bowel/Bladder Dysfunction: none  Conservative measures:  Physical therapy: currently participating in at [facility]  Multimodal medical therapy including regular antiinflammatories: prednisone, flexeril, tylenol  Injections: no recent epidural steroid injections  Past Surgery:  Cervical Fusion by Dr. Jeral Fruit Multiple lumbar surgeries with the last one 12 years ago by Dr. Katrine Coho has no symptoms of cervical myelopathy.  The symptoms are causing a significant impact on the patient's life.   Progress Note from Drake Leach, [place] on 08/27/22:   History of Present Illness: Heather Beasley is a 67 y.o. female who presents to review her lumbar MRI.    History of cervical and lumbar fusion. Has chronic left foot drop. Fusion at L4-S1  looked good on previous xrays. She has known adjacent level spondylosis at L3-L4.    She continues with constant LBP with right hip pain into her groin. She also has pain in anterior thigh, not below her knee. She notes numbness/tingling in right leg (entire leg). Right leg from knee down has been feeling more numb at times. She has intermittent spasms at night and is taking flexeril. Needs a refill. Previous ESIs made her pain worse. No recent PT.  I have utilized the care everywhere function in epic to review the outside records available from external health systems.  Review of Systems:  A 10 point review of systems is negative, except for the pertinent positives and negatives detailed in the HPI.  Past Medical History: Past Medical History:  Diagnosis Date   Allergic rhinitis    Arthritis    Asthma    Cancer of nasal cavity and sinus 07/07/2015   radiation   Cervical spondylosis    Colitis    Colon polyps    Depression    Diverticulosis    GERD (gastroesophageal reflux disease)    Glaucoma    Headaches, cluster    HTN (hypertension)    Hyperlipidemia    IBS (irritable bowel syndrome)    Kidney stones    Melanoma    above the L knee    Neuropathy    PONV (postoperative nausea and vomiting)    Tobacco abuse     Past Surgical History: Past Surgical History:  Procedure Laterality Date   ABDOMINAL HYSTERECTOMY  1998   APPENDECTOMY  1998   ARM FX REPAIR  2001   BACK SURGERY     CAROTID PTA/STENT INTERVENTION Left 06/27/2020   Procedure: CAROTID PTA/STENT INTERVENTION;  Surgeon: Annice Needyew, Jason S, MD;  Location: [facility] INVASIVE CV [facility];  Service: Cardiovascular;  Laterality: Left;   CERVICAL FUSION  2010   CHOLECYSTECTOMY  08/2011   Dr Renda RollsWilton Smith   COLONOSCOPY     Dr Markham JordanElliot   CYSTOSCOPY     Dr. Lonna CobbStoioff   DILATION AND CURETTAGE OF UTERUS     ESOPHAGOGASTRODUODENOSCOPY  2012   Dr Markham JordanElliot   FRACTURE SURGERY  2001   plate in right wrist and arm   IMAGE GUIDED SINUS SURGERY N/A  06/25/2015   Procedure: IMAGE GUIDED SINUS SURGERY;  Surgeon: Linus Salmonshapman McQueen, MD;  Location: [facility] ORS;  Service: ENT;  Laterality: N/A;   LUMBAR SPINE SURGERY  07/2004   OOPHORECTOMY     POLYPECTOMY N/A 06/25/2015   Procedure: POLYPECTOMY NASAL;  Surgeon: Linus Salmonshapman McQueen, MD;  Location: [facility] ORS;  Service: ENT;  Laterality: N/A;   TONSILLECTOMY  1968   VAGINAL DELIVERY     x2    Allergies: Allergies as of 02/02/2023 - Review Complete 01/28/2023  Allergen Reaction Noted   Albumin (human) Rash 02/26/2015   Amoxicillin-pot clavulanate Hives 02/12/2012   Gabapentin Shortness Of Breath 02/13/2014   Latex Dermatitis and Rash 05/13/2011   Betadine [povidone iodine] Dermatitis, Rash, and Other (See Comments) 05/13/2011   Sucralfate  02/26/2015   Amoxicillin  09/14/2007   Chantix [varenicline tartrate]  01/04/2012   Erythromycin  09/14/2007   Sucralfate Nausea And Vomiting 01/04/2012    Medications: No outpatient medications have been marked as taking for the 02/02/23 encounter (Office Visit) with Venetia NightYarbrough, Shahana Capes, MD.    Social History: Social History   Tobacco Use   Smoking status: Former    Packs/day: 1.00    Years: 39.00    Additional pack years: 0.00    Total pack years: 39.00    Types: Cigarettes    Quit date: 12/19/2022    Years since quitting: 0.1   Smokeless tobacco: Never   Tobacco comments:    Using vape  Vaping Use   Vaping Use: Every day  Substance Use Topics   Alcohol use: Not Currently   Drug use: No    Family Medical History: Family History  Problem Relation Age of Onset   Hypertension Mother    Hyperlipidemia Mother    Diabetes Mother    Colon cancer Father    Pancreatic cancer Father    Cancer Father        Colon & Pancreatic   Breast cancer Paternal Grandmother [id number]   Diabetes Paternal Grandmother    Coronary artery disease Paternal Grandmother    Heart failure Paternal Grandmother    Cancer Paternal Grandmother        breast   Stroke Brother      Physical Examination: There were no vitals filed for this visit.  Telephone visit today.  Medical Decision Making  Imaging: MRI L spine 08/18/2022 IMPRESSION: 1. Status post posterior instrumented fusion and decompression from L4 through S1 without residual spinal canal or neural foraminal stenosis. 2. Adjacent segment disease at L3-L4 has significantly progressed since 2014 with a prominent central protrusion and moderate facet arthropathy with ligamentum flavum thickening resulting in severe spinal canal stenosis with compression of the cauda equina nerve roots and mild left neural foraminal stenosis. 3. Right subarticular zone protrusion at L2-L3  without evidence of impingement. 4. Levocurvature centered at L3.     Electronically Signed   By: Lesia Hausen M.D.   On: 08/19/2022 12:36  I have personally reviewed the images and agree with the above interpretation.  Assessment and Plan: Heather Beasley is a pleasant 67 y.o. female with adjacent segment disease at L3-4.  She has moderate to severe stenosis at this level.  I think she is symptomatic from this.  She has not improved with physical therapy.  She is at the point where surgical intervention is the most appropriate neck step.  Due to the retrolisthesis of L3-4, I would recommend supplementing the anterior column with the lateral lumbar interbody fusion followed by extension of her instrumentation and fusion posteriorly.  She has now discontinued nicotine containing products for more than 30 days.  She is ready to proceed with surgery.  She has been cleared by her pulmonologist.  She will continue on her pulmonology medications.  I discussed the planned procedure at length with the patient, including the risks, benefits, alternatives, and indications. The risks discussed include but are not limited to bleeding, infection, need for reoperation, spinal fluid leak, stroke, vision loss, anesthetic complication, coma,  paralysis, and even death. I also described the possibility of psoas weakness and paresthesias. I described in detail that improvement was not guaranteed.  The patient expressed understanding of these risks, and asked that we proceed with surgery. I described the surgery in layman's terms, and gave ample opportunity for questions, which were answered to the best of my ability. This visit was performed via telephone.  Patient location: home Provider location: office  I spent a total of 10 minutes non-face-to-face activities for this visit on the date of this encounter including review of current clinical condition and response to treatment.  The patient is aware of and accepts the limits of this telehealth visit.      Heather Beasley K. Myer Haff MD, [facility]

## 2023-02-02 NOTE — H&P (View-Only) (Signed)
  Referring Physician:  No referring provider defined for this encounter.  Primary Physician:  Dugal, Tabitha, FNP  History of Present Illness: 02/02/2023 [name] presents today for telephone visit.  She has continued symptoms.  She has been off of all nicotine containing products for at least 30 days.  She is vaping but with nonnicotine containing fluid.  12/10/2022 [name] returns to see me she continues to smoke 2 cigarettes/day.  She has had some significant issues with her blood pressure.  She is seeing her cardiologist tomorrow.  She has been doing physical therapy without improvement.  10/15/2022 [name] is here today with a chief complaint of low back pain that radiates into her right hip. She saw [facility] for hip and on 10/09/22 was put on a medrol dosepak which has not seemed to help.  She has been having trouble for approximately 2 months.  She reports sharp and throbbing pain into her right hip.  She has pain around her right knee when she stands or bends.  Walking makes it worse.  Sitting and resting makes it better.  Her pain has been worsening over time and is now interfering with her day-to-day life.   Bowel/Bladder Dysfunction: none  Conservative measures:  Physical therapy: currently participating in at [facility]  Multimodal medical therapy including regular antiinflammatories: prednisone, flexeril, tylenol  Injections: no recent epidural steroid injections  Past Surgery:  Cervical Fusion by Dr. Botero Multiple lumbar surgeries with the last one 12 years ago by Dr. Botero   Heather Beasley has no symptoms of cervical myelopathy.  The symptoms are causing a significant impact on the patient's life.   Progress Note from Stacy Luna, PA on 08/27/22:   History of Present Illness: Heather Beasley is a 66 y.o. female who presents to review her lumbar MRI.    History of cervical and lumbar fusion. Has chronic left foot drop. Fusion at L4-S1  looked good on previous xrays. She has known adjacent level spondylosis at L3-L4.    She continues with constant LBP with right hip pain into her groin. She also has pain in anterior thigh, not below her knee. She notes numbness/tingling in right leg (entire leg). Right leg from knee down has been feeling more numb at times. She has intermittent spasms at night and is taking flexeril. Needs a refill. Previous ESIs made her pain worse. No recent PT.  I have utilized the care everywhere function in epic to review the outside records available from external health systems.  Review of Systems:  A 10 point review of systems is negative, except for the pertinent positives and negatives detailed in the HPI.  Past Medical History: Past Medical History:  Diagnosis Date   Allergic rhinitis    Arthritis    Asthma    Cancer of nasal cavity and sinus 07/07/2015   radiation   Cervical spondylosis    Colitis    Colon polyps    Depression    Diverticulosis    GERD (gastroesophageal reflux disease)    Glaucoma    Headaches, cluster    HTN (hypertension)    Hyperlipidemia    IBS (irritable bowel syndrome)    Kidney stones    Melanoma    above the L knee    Neuropathy    PONV (postoperative nausea and vomiting)    Tobacco abuse     Past Surgical History: Past Surgical History:  Procedure Laterality Date   ABDOMINAL HYSTERECTOMY    1998   APPENDECTOMY  1998   ARM FX REPAIR  2001   BACK SURGERY     CAROTID PTA/STENT INTERVENTION Left 06/27/2020   Procedure: CAROTID PTA/STENT INTERVENTION;  Surgeon: Dew, Jason S, MD;  Location: [facility] INV[facility]IVE [facility];  Service: Cardiovascular;  Laterality: Left;   CERVICAL FUSION  2010   CHOLECYSTECTOMY  08/2011   Dr Wilton Smith   COLONOSCOPY     Dr Elliot   CYSTOSCOPY     Dr. Stoioff   DILATION AND CURETTAGE OF UTERUS     ESOPHAGOGASTRODUODENOSCOPY  2012   Dr Elliot   FRACTURE SURGERY  2001   plate in right wrist and arm   IMAGE GUIDED SINUS SURGERY N/A  06/25/2015   Procedure: IMAGE GUIDED SINUS SURGERY;  Surgeon: Chapman McQueen, MD;  Location: [facility] ORS;  Service: ENT;  Laterality: N/A;   LUMBAR SPINE SURGERY  07/2004   OOPHORECTOMY     POLYPECTOMY N/A 06/25/2015   Procedure: POLYPECTOMY NASAL;  Surgeon: Chapman McQueen, MD;  Location: [facility] ORS;  Service: ENT;  Laterality: N/A;   TONSILLECTOMY  1968   VAGINAL DELIVERY     x2    Allergies: Allergies as of 02/02/2023 - Review Complete 01/28/2023  Allergen Reaction Noted   Albumin (human) Rash 02/26/2015   Amoxicillin-pot clavulanate Hives 02/12/2012   Gabapentin Shortness Of Breath 02/13/2014   Latex Dermatitis and Rash 05/13/2011   Betadine [povidone iodine] Dermatitis, Rash, and Other (See Comments) 05/13/2011   Sucralfate  02/26/2015   Amoxicillin  09/14/2007   Chantix [varenicline tartrate]  01/04/2012   Erythromycin  09/14/2007   Sucralfate Nausea And Vomiting 01/04/2012    Medications: No outpatient medications have been marked as taking for the 02/02/23 encounter (Office Visit) with Daishia Fetterly, MD.    Social History: Social History   Tobacco Use   Smoking status: Former    Packs/day: 1.00    Years: 39.00    Additional pack years: 0.00    Total pack years: 39.00    Types: Cigarettes    Quit date: 12/19/2022    Years since quitting: 0.1   Smokeless tobacco: Never   Tobacco comments:    Using vape  Vaping Use   Vaping Use: Every day  Substance Use Topics   Alcohol use: Not Currently   Drug use: No    Family Medical History: Family History  Problem Relation Age of Onset   Hypertension Mother    Hyperlipidemia Mother    Diabetes Mother    Colon cancer Father    Pancreatic cancer Father    Cancer Father        Colon & Pancreatic   Breast cancer Paternal Grandmother 38   Diabetes Paternal Grandmother    Coronary artery disease Paternal Grandmother    Heart failure Paternal Grandmother    Cancer Paternal Grandmother        breast   Stroke Brother      Physical Examination: There were no vitals filed for this visit.  Telephone visit today.  Medical Decision Making  Imaging: MRI L spine 08/18/2022 IMPRESSION: 1. Status post posterior instrumented fusion and decompression from L4 through S1 without residual spinal canal or neural foraminal stenosis. 2. Adjacent segment disease at L3-L4 has significantly progressed since 2014 with a prominent central protrusion and moderate facet arthropathy with ligamentum flavum thickening resulting in severe spinal canal stenosis with compression of the cauda equina nerve roots and mild left neural foraminal stenosis. 3. Right subarticular zone protrusion at L2-L3   without evidence of impingement. 4. Levocurvature centered at L3.     Electronically Signed   By: Peter  Noone M.D.   On: 08/19/2022 12:36  I have personally reviewed the images and agree with the above interpretation.  Assessment and Plan: Ms. Brigante is a pleasant 66 y.o. female with adjacent segment disease at L3-4.  She has moderate to severe stenosis at this level.  I think she is symptomatic from this.  She has not improved with physical therapy.  She is at the point where surgical intervention is the most appropriate neck step.  Due to the retrolisthesis of L3-4, I would recommend supplementing the anterior column with the lateral lumbar interbody fusion followed by extension of her instrumentation and fusion posteriorly.  She has now discontinued nicotine containing products for more than 30 days.  She is ready to proceed with surgery.  She has been cleared by her pulmonologist.  She will continue on her pulmonology medications.  I discussed the planned procedure at length with the patient, including the risks, benefits, alternatives, and indications. The risks discussed include but are not limited to bleeding, infection, need for reoperation, spinal fluid leak, stroke, vision loss, anesthetic complication, coma,  paralysis, and even death. I also described the possibility of psoas weakness and paresthesias. I described in detail that improvement was not guaranteed.  The patient expressed understanding of these risks, and asked that we proceed with surgery. I described the surgery in layman's terms, and gave ample opportunity for questions, which were answered to the best of my ability. This visit was performed via telephone.  Patient location: home Provider location: office  I spent a total of 10 minutes non-face-to-face activities for this visit on the date of this encounter including review of current clinical condition and response to treatment.  The patient is aware of and accepts the limits of this telehealth visit.      Jyden Kromer K. Laurena Valko MD, MPHS Neurosurgery 

## 2023-02-03 ENCOUNTER — Telehealth: Payer: Self-pay

## 2023-02-03 NOTE — Telephone Encounter (Signed)
I spoke with Heather Beasley. We discussed the following:   Planned surgery: L3-4 lateral lumbar interbody fusion, L3-S1 posterior spinal fusion   Surgery date: 03/01/23 - you will find out your arrival time the business day before your surgery.   Pre-op appointment at [facility] Pre-admit Testing: we will call you with a date/time for this. Pre-admit testing is located on the first floor of the [facility] building, [address] [facility] [address], [address]. Please bring all prescriptions in the original prescription bottles to your appointment, even if you have reviewed medications by phone with a pharmacy representative. Pre-op labs may be done at your pre-op appointment. You are not required to fast for these labs. Should you need to change your pre-op appointment, please call Pre-admit testing at [phone].    Surgical clearance: we will send a clearance form to Sheppard Plumber, NP regarding holding your plavix.    Blood thinners:  Plavix: Stop Plavix 7 days prior, resume Plavix 14 days after    NSAIDS (Non-steroidal anti-inflammatory drugs): because you are having a fusion, no NSAIDS (such as ibuprofen, aleve, naproxen, meloxicam, diclofenac) for 3 months after surgery. Celebrex is an exception. Tylenol is ok because it is not an NSAID.    Home health physical therapy: [facility] will contact you regarding home health physical therapy for after surgery.Their number is [phone].    Because you are having a fusion: for appointments after your 2 week follow-up: please arrive at the [facility] outpatient imaging center ([address], [address], [place]) or [facility] one hour prior to your appointment for x-rays. This applies to every appointment after your 2 week follow-up. Failure to do so may result in your appointment being rescheduled.   If you have FMLA/disability paperwork, please drop it off or fax it to  [phone], attention Patty.   We can be reached by phone or mychart 8am-4pm, Monday-Friday. If you have any questions/concerns before or after surgery, you can reach [place] at [phone], or you can send a mychart message. If you have a concern after hours that cannot wait until normal business hours, you can call [phone] and ask to page the neurosurgeon on call for [place].    Appointments/FMLA & disability paperwork: Patty & Cristin  Nurse: Royston Cowper  Medical assistants: Laurann Montana Physician Assistant's: Manning Charity & Drake Leach Surgeon: Venetia Night, MD

## 2023-02-04 ENCOUNTER — Other Ambulatory Visit: Payer: Self-pay

## 2023-02-04 DIAGNOSIS — Z01818 Encounter for other preprocedural examination: Secondary | ICD-10-CM

## 2023-02-08 DIAGNOSIS — J301 Allergic rhinitis due to pollen: Secondary | ICD-10-CM | POA: Diagnosis not present

## 2023-02-09 ENCOUNTER — Other Ambulatory Visit (INDEPENDENT_AMBULATORY_CARE_PROVIDER_SITE_OTHER): Payer: Self-pay | Admitting: Nurse Practitioner

## 2023-02-09 ENCOUNTER — Other Ambulatory Visit: Payer: Self-pay | Admitting: Nurse Practitioner

## 2023-02-09 DIAGNOSIS — J439 Emphysema, unspecified: Secondary | ICD-10-CM

## 2023-02-11 ENCOUNTER — Telehealth: Payer: Self-pay | Admitting: Family

## 2023-02-11 ENCOUNTER — Encounter: Payer: Self-pay | Admitting: Pulmonary Disease

## 2023-02-11 NOTE — Telephone Encounter (Signed)
Contacted Heather Beasley to schedule their annual wellness visit. Appointment made for 02/25/2023.  [facility] Care Guide [facility] AWV TEAM Direct Dial: [phone]

## 2023-02-15 ENCOUNTER — Telehealth (INDEPENDENT_AMBULATORY_CARE_PROVIDER_SITE_OTHER): Payer: Self-pay | Admitting: Nurse Practitioner

## 2023-02-15 DIAGNOSIS — J301 Allergic rhinitis due to pollen: Secondary | ICD-10-CM | POA: Diagnosis not present

## 2023-02-15 NOTE — Telephone Encounter (Signed)
Called to get another another form faxed over

## 2023-02-15 NOTE — Telephone Encounter (Signed)
Kristen from [facility] called in reference to clearance form  for patient who is having surgery on 03/01/23 for [facility] to sign.  Fax #:[phone]. If you need another form, please call [phone].

## 2023-02-16 ENCOUNTER — Other Ambulatory Visit: Payer: Self-pay | Admitting: Acute Care

## 2023-02-16 ENCOUNTER — Encounter
Admission: RE | Admit: 2023-02-16 | Discharge: 2023-02-16 | Disposition: A | Discharge status: Home / Self Care | Payer: Medicare PPO | Source: Ambulatory Visit | Attending: Neurosurgery | Admitting: Neurosurgery

## 2023-02-16 ENCOUNTER — Other Ambulatory Visit: Payer: Self-pay

## 2023-02-16 VITALS — BP 138/76 | HR 86 | Resp 16 | Ht 65.0 in | Wt 196.7 lb

## 2023-02-16 DIAGNOSIS — I10 Essential (primary) hypertension: Secondary | ICD-10-CM | POA: Diagnosis not present

## 2023-02-16 DIAGNOSIS — R531 Weakness: Secondary | ICD-10-CM | POA: Insufficient documentation

## 2023-02-16 DIAGNOSIS — Z22322 Carrier or suspected carrier of Methicillin resistant Staphylococcus aureus: Secondary | ICD-10-CM

## 2023-02-16 DIAGNOSIS — Z01812 Encounter for preprocedural laboratory examination: Secondary | ICD-10-CM | POA: Diagnosis not present

## 2023-02-16 DIAGNOSIS — F17210 Nicotine dependence, cigarettes, uncomplicated: Secondary | ICD-10-CM

## 2023-02-16 DIAGNOSIS — Z122 Encounter for screening for malignant neoplasm of respiratory organs: Secondary | ICD-10-CM

## 2023-02-16 DIAGNOSIS — Z87891 Personal history of nicotine dependence: Secondary | ICD-10-CM

## 2023-02-16 DIAGNOSIS — Z01818 Encounter for other preprocedural examination: Secondary | ICD-10-CM

## 2023-02-16 HISTORY — DX: Chronic obstructive pulmonary disease, unspecified: J44.9

## 2023-02-16 HISTORY — DX: Myoneural disorder, unspecified: G70.9

## 2023-02-16 HISTORY — DX: Atherosclerotic heart disease of native coronary artery without angina pectoris: I25.10

## 2023-02-16 HISTORY — DX: Carrier or suspected carrier of methicillin resistant Staphylococcus aureus: Z22.322

## 2023-02-16 HISTORY — DX: Personal history of urinary calculi: Z87.442

## 2023-02-16 HISTORY — DX: Cardiac murmur, unspecified: R01.1

## 2023-02-16 HISTORY — DX: Pneumonia, unspecified organism: J18.9

## 2023-02-16 LAB — CBC
HCT: 40.5 % (ref 36.0–46.0)
Hemoglobin: 13 g/dL (ref 12.0–15.0)
MCH: 32 pg (ref 26.0–34.0)
MCHC: 32.1 g/dL (ref 30.0–36.0)
MCV: 99.8 fL (ref 80.0–100.0)
Platelets: 263 10*3/uL (ref 150–400)
RBC: 4.06 MIL/uL (ref 3.87–5.11)
RDW: 12.1 % (ref 11.5–15.5)
WBC: 5.7 10*3/uL (ref 4.0–10.5)
nRBC: 0 % (ref 0.0–0.2)

## 2023-02-16 LAB — URINALYSIS, ROUTINE W REFLEX MICROSCOPIC
Bilirubin Urine: NEGATIVE
Glucose, UA: NEGATIVE mg/dL
Hgb urine dipstick: NEGATIVE
Ketones, ur: NEGATIVE mg/dL
Leukocytes,Ua: NEGATIVE
Nitrite: NEGATIVE
Protein, ur: NEGATIVE mg/dL
Specific Gravity, Urine: 1.006 (ref 1.005–1.030)
pH: 7 (ref 5.0–8.0)

## 2023-02-16 LAB — TYPE AND SCREEN
ABO/RH(D): A POS
Antibody Screen: NEGATIVE

## 2023-02-16 LAB — SURGICAL PCR SCREEN
MRSA, PCR: POSITIVE — AB
Staphylococcus aureus: POSITIVE — AB

## 2023-02-16 NOTE — Patient Instructions (Addendum)
Your procedure is scheduled on: 03/01/23 - Monday Report to the Registration Desk on the 1st floor of the [facility]. To find out your arrival time, please call [phone] between 1PM - 3PM on: 02/26/23 - Friday If your arrival time is 6:00 am, do not arrive before that time as the [facility] entrance doors do not open until 6:00 am.  REMEMBER: Instructions that are not followed completely may result in serious medical risk, up to and including death; or upon the discretion of your surgeon and anesthesiologist your surgery may need to be rescheduled.  Do not eat food after midnight the night before surgery.  No gum chewing or hard candies.  You may however, drink CLEAR liquids up to 2 hours before you are scheduled to arrive for your surgery. Do not drink anything within 2 hours of your scheduled arrival time.  Clear liquids include: - water  - apple juice without pulp - gatorade (not RED colors) - black coffee or tea (Do NOT add milk or creamers to the coffee or tea) Do NOT drink anything that is not on this list.  One week prior to surgery: Stop Anti-inflammatories (NSAIDS) such as Advil, Aleve, Ibuprofen, Motrin, Naproxen, Naprosyn and Aspirin based products such as Excedrin, Goody's Powder, BC Powder.  Stop ANY OVER THE COUNTER supplements until after surgery beginning 02/22/23.  You may however, continue to take Tylenol if needed for pain up until the day of surgery.  Continue taking all prescribed medications with the exception of the following:  Stop Plavix 7 days prior beginning 02/22/23, resume Plavix 14 days after   TAKE ONLY THESE MEDICATIONS THE MORNING OF SURGERY WITH A SIP OF WATER:  pantoprazole (PROTONIX)  amLODipine (NORVASC)  BREZTRI AEROSPHERE  Use inhalers on the day of surgery and bring to the hospital.  No Alcohol for 24 hours before or after surgery.  No Smoking including e-cigarettes for 24 hours before surgery.  No chewable tobacco products  for at least 6 hours before surgery.  No nicotine patches on the day of surgery.  Do not use any "recreational" drugs for at least a week (preferably 2 weeks) before your surgery.  Please be advised that the combination of cocaine and anesthesia may have negative outcomes, up to and including death. If you test positive for cocaine, your surgery will be cancelled.  On the morning of surgery brush your teeth with toothpaste and water, you may rinse your mouth with mouthwash if you wish. Do not swallow any toothpaste or mouthwash.  Use CHG Soap or wipes as directed on instruction sheet.  Do not wear jewelry, make-up, hairpins, clips or nail polish.  Do not wear lotions, powders, or perfumes.   Do not shave body hair from the neck down 48 hours before surgery.  Contact lenses, hearing aids and dentures may not be worn into surgery.  Do not bring valuables to the hospital. [facility] is not responsible for any missing/lost belongings or valuables.   Notify your doctor if there is any change in your medical condition (cold, fever, infection).  Wear comfortable clothing (specific to your surgery type) to the hospital.  After surg  /., m ery, you can help prevent lung complications by doing breathing exercises.  Take deep  breaths and cough every 1-2 hours. Your doctor may order a device called an Incentive Spirometer to help you take deep breaths. When coughing or sneezing, hold a pillow firmly against your incision with both hands. This is called "splinting."  Doing this helps protect your incision. It also decreases belly discomfort.  If you are being admitted to the hospital overnight, leave your suitcase in the car. After surgery it may be brought to your room.  In case of increased patient census, it may be necessary for you, the patient, to continue your postoperative care in the Same Day Surgery department.  If you are being discharged the day of surgery, you will not be allowed  to drive home. You will need a responsible individual to drive you home and stay with you for 24 hours after surgery.   If you are taking public transportation, you will need to have a responsible individual with you.  Please call the Pre-admissions Testing Dept. at [phone] if you have any questions about these instructions.  Surgery Visitation Policy:  Patients having surgery or a procedure may have two visitors.  Children under the age of 9 must have an adult with them who is not the patient.  Inpatient Visitation:    Visiting hours are 7 a.m. to 8 p.m. Up to four visitors are allowed at one time in a patient room. The visitors may rotate out with other people during the day.  One visitor age 51 or older may stay with the patient overnight and must be in the room by 8 p.m.

## 2023-02-16 NOTE — Progress Notes (Signed)
  Perioperative Services  Abnormal Lab Notification and Treatment Plan of Care  Date: 02/16/23  Name: Heather Beasley MRN:   [id number]  Re: Abnormal labs noted during PAT appointment  Provider Notified: Venetia Night, MD Notification mode: Routed and/or faxed via [facility]  Labs of concern: Lab Results  Component Value Date   STAPHAUREUS POSITIVE (A) 02/16/2023   MRSAPCR POSITIVE (A) 02/16/2023    Notes: Patient is scheduled for a L3-4 LATERAL LUMBAR INTERBODY FUSION; L3-S1 POSTERIOR SPINAL FUSION; APPLICATION OF INTRAOPERATIVE CT SCAN on 03/01/2023. She is scheduled to receive CEFAZOLIN + VANCOMYCIN pre-operatively. Surgical PCR (+) for MRSA; see above.  PLANS:  Review renal function.  BUN 17 mg/dL and creatinine 4.09 mg/dL.  eGFR 60 mL/min  Review allergies. No documented allergy to vancomycin.  Patient is scheduled for interbody fusion.  Per surgeons protocol for this procedure, patient already receiving cefazolin + vancomycin allowing for both MSSA and MRSA coverage.  Medical history in [facility] updated to reflect (+) PCR result indicating nasal MRSA colonization   No further actions required for this patient at this time.  Quentin Mulling, MSN, APRN, FNP-C, CEN [facility]  Peri-operative Services Nurse Practitioner Phone: [phone] 02/16/23 2:43 PM

## 2023-02-17 LAB — NICOTINE SCREEN, URINE: Cotinine Ql Scrn, Ur: NEGATIVE ng/mL

## 2023-02-22 DIAGNOSIS — J301 Allergic rhinitis due to pollen: Secondary | ICD-10-CM | POA: Diagnosis not present

## 2023-02-25 ENCOUNTER — Encounter: Payer: Self-pay | Admitting: Neurosurgery

## 2023-02-25 ENCOUNTER — Ambulatory Visit (INDEPENDENT_AMBULATORY_CARE_PROVIDER_SITE_OTHER): Payer: Medicare PPO

## 2023-02-25 ENCOUNTER — Telehealth: Payer: Self-pay | Admitting: Neurosurgery

## 2023-02-25 VITALS — Ht 65.0 in | Wt 195.0 lb

## 2023-02-25 DIAGNOSIS — Z1231 Encounter for screening mammogram for malignant neoplasm of breast: Secondary | ICD-10-CM | POA: Diagnosis not present

## 2023-02-25 DIAGNOSIS — Z0000 Encounter for general adult medical examination without abnormal findings: Secondary | ICD-10-CM | POA: Diagnosis not present

## 2023-02-25 NOTE — Progress Notes (Signed)
I connected with  Heather Beasley on 02/25/23 by a audio enabled telemedicine application and verified that I am speaking with the correct person using two identifiers.  Patient Location: Home  Provider Location: Home Office  I discussed the limitations of evaluation and management by telemedicine. The patient expressed understanding and agreed to proceed.  Subjective:   Heather Beasley is a 67 y.o. female who presents for Medicare Annual (Subsequent) preventive examination.  Review of Systems      Cardiac Risk Factors include: advanced age (>44men, >24 women);sedentary lifestyle;hypertension     Objective:    Today's Vitals   02/25/23 0919 02/25/23 0920  Weight: 195 lb (88.5 kg)   Height: 5\' 5"  (1.651 m)   PainSc:  7    Body mass index is 32.45 kg/m.     02/25/2023    9:40 AM 02/16/2023   10:35 AM 03/04/2022   12:05 PM 12/31/2021   10:03 PM 06/27/2020    6:48 PM 05/25/2019   10:27 AM 08/23/2018   10:28 AM  Advanced Directives  Does Patient Have a Medical Advance Directive? No No No No No No No  Would patient like information on creating a medical advance directive? No - Patient declined  No - Patient declined  No - Patient declined No - Patient declined     Current Medications (verified) Outpatient Encounter Medications as of 02/25/2023  Medication Sig   acetaminophen (TYLENOL) 650 MG CR tablet Take 650 mg by mouth every 8 (eight) hours as needed for pain.   albuterol (VENTOLIN HFA) 108 (90 Base) MCG/ACT inhaler Inhale 1-2 puffs into the lungs every 6 (six) hours as needed for wheezing or shortness of breath.   amLODipine (NORVASC) 5 MG tablet Take 1 tablet by mouth each morning and one tablet in the evening (total daily dose of 10mg )   atorvastatin (LIPITOR) 20 MG tablet TAKE 1 TABLET BY MOUTH ONCE A DAY   Budeson-Glycopyrrol-Formoterol (BREZTRI AEROSPHERE) 160-9-4.8 MCG/ACT AERO Inhale 2 puffs into the lungs in the morning and at bedtime.   cetirizine (ZYRTEC) 10 MG tablet  Take 10 mg by mouth daily. Allergies.   Cholecalciferol (VITAMIN D) 50 MCG (2000 UT) CAPS Take 2,000 Units by mouth.   cyclobenzaprine (FLEXERIL) 10 MG tablet Take 10 mg by mouth at bedtime. Pt takes 1 tablet at night.   EPINEPHrine 0.3 mg/0.3 mL IJ SOAJ injection Inject 1 mg into the skin as needed.   Famotidine (PEPCID PO) Take 1 tablet by mouth at bedtime.   furosemide (LASIX) 20 MG tablet Take 1 tablet (20 mg total) by mouth as needed for fluid.   losartan (COZAAR) 100 MG tablet Take 1 tablet (100 mg total) by mouth daily.   montelukast (SINGULAIR) 10 MG tablet TAKE ONE TABLET BY MOUTH AT BEDTIME   oxymetazoline (AFRIN) 0.05 % nasal spray Place 1 spray into both nostrils 2 (two) times daily as needed for congestion. Use for nose bleeds   pantoprazole (PROTONIX) 40 MG tablet Take 1 tablet (40 mg total) by mouth 2 (two) times daily. (Patient taking differently: Take 40 mg by mouth daily.)   clopidogrel (PLAVIX) 75 MG tablet TAKE ONE TABLET BY MOUTH ONCE A DAY (Patient not taking: Reported on 02/25/2023)   Multiple Vitamins-Minerals (CENTRUM ADULTS PO) Take 1 tablet by mouth. (Patient not taking: Reported on 02/25/2023)   vitamin B-12 (CYANOCOBALAMIN) 1000 MCG tablet Take 1,000 mcg by mouth daily. (Patient not taking: Reported on 02/25/2023)   No facility-administered encounter medications on file  as of 02/25/2023.    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   radiation   Cervical spondylosis    Colitis    Colon polyps    COPD (chronic obstructive pulmonary disease) (HCC)    Coronary artery disease    1 stent in carotid artery   Depression    Diverticulosis    GERD (gastroesophageal reflux  disease)    Glaucoma    she reports that she does not have glacoma, she has thick corneas   Headaches, cluster    Heart murmur    History of kidney stones    HTN (hypertension)    Hyperlipidemia    IBS (irritable bowel syndrome)    Kidney stones    Melanoma (HCC)    above the L knee    Neuromuscular disorder (HCC)    neuropathy in left leg and left foot   Neuropathy    Nose colonized with MRSA 02/16/2023   a.) surgical PCR (+) 02/16/2023 prior to L3-4 LATERAL LUMBAR INTERBODY FUSION; L3-S1 POSTERIOR SPINAL FUSION   Pneumonia    PONV (postoperative nausea and vomiting)    2012 - during lumbar fusion she developed spinal fluid leak, and had to have 3 units on Blood, BP dropped, and in ICU,   Tobacco abuse    Past Surgical History:  Procedure Laterality Date   ABDOMINAL HYSTERECTOMY  1998   APPENDECTOMY  1998   ARM FX REPAIR  2001   BACK SURGERY     CAROTID PTA/STENT INTERVENTION Left 06/27/2020   Procedure: CAROTID PTA/STENT INTERVENTION;  Surgeon: Annice Needy, MD;  Location: [facility];  Service: Cardiovascular;  Laterality: Left;   CERVICAL FUSION  2010   CHOLECYSTECTOMY  08/2011   Dr Renda Rolls   COLONOSCOPY     Dr Markham Jordan   CYSTOSCOPY     Dr. Lonna Cobb   DILATION AND CURETTAGE OF UTERUS     ESOPHAGOGASTRODUODENOSCOPY  2012   Dr Markham Jordan   EYE SURGERY     cataract   FRACTURE SURGERY  2001   plate in right wrist and arm   IMAGE GUIDED SINUS SURGERY N/A 06/25/2015   Procedure: IMAGE GUIDED SINUS SURGERY;  Surgeon: Linus Salmons, MD;  Location: [facility];  Service: ENT;  Laterality: N/A;   LUMBAR SPINE SURGERY  07/2004   OOPHORECTOMY     POLYPECTOMY N/A 06/25/2015   Procedure: POLYPECTOMY NASAL;  Surgeon: Linus Salmons, MD;  Location: [facility];  Service: ENT;  Laterality: N/A;   TONSILLECTOMY  1968   VAGINAL DELIVERY     x2   Family History  Problem Relation Age of Onset   Hypertension Mother    Hyperlipidemia Mother    Diabetes Mother    Colon  cancer Father    Pancreatic cancer Father    Cancer Father        Colon & Pancreatic   Breast cancer Paternal Grandmother 23   Diabetes Paternal Grandmother    Coronary artery disease Paternal Grandmother    Heart failure Paternal Grandmother    Cancer Paternal Grandmother        breast   Stroke Brother    Social History   Socioeconomic History   Marital status: Married  Spouse name: Dorinda Hill   Number of children: 2   Years of education: Not on file   Highest education level: Not on file  Occupational History   Occupation: Conservation officer, nature   Occupation: disabled  Tobacco Use   Smoking status: Former    Packs/day: 1.00    Years: 39.00    Additional pack years: 0.00    Total pack years: 39.00    Types: Cigarettes    Quit date: 12/19/2022    Years since quitting: 0.1   Smokeless tobacco: Never   Tobacco comments:    Using vape  Vaping Use   Vaping Use: Every day   Substances: Flavoring  Substance and Sexual Activity   Alcohol use: Not Currently   Drug use: No   Sexual activity: Not Currently    Partners: Male  Other Topics Concern   Not on file  Social History Narrative   Lives in [place] with husband. Dog in home. Work - disabled for neck and back pain.   Social Determinants of Health   Financial Resource Strain: Low Risk  (02/25/2023)   Overall Financial Resource Strain (CARDIA)    Difficulty of Paying Living Expenses: Not hard at all  Food Insecurity: No Food Insecurity (02/25/2023)   Hunger Vital Sign    Worried About Running Out of Food in the Last Year: Never true    Ran Out of Food in the Last Year: Never true  Transportation Needs: No Transportation Needs (02/25/2023)   PRAPARE - Administrator, Civil Service (Medical): No    Lack of Transportation (Non-Medical): No  Physical Activity: Inactive (02/25/2023)   Exercise Vital Sign    Days of Exercise per Week: 0 days    Minutes of Exercise per Session: 0 min  Stress: No Stress Concern Present  (02/25/2023)   [employer] of Occupational Health - Occupational Stress Questionnaire    Feeling of Stress : Not at all  Social Connections: Moderately Isolated (02/25/2023)   Social Connection and Isolation Panel [NHANES]    Frequency of Communication with Friends and Family: Twice a week    Frequency of Social Gatherings with Friends and Family: Twice a week    Attends Religious Services: Never    Database administrator or Organizations: No    Attends Engineer, structural: Never    Marital Status: Married    Tobacco Counseling Counseling given: Not Answered Tobacco comments: Using vape   Clinical Intake:  Pre-visit preparation completed: Yes  Pain : 0-10 Pain Score: 7  Pain Type: Chronic pain Pain Location: Generalized Pain Orientation: Lower Pain Descriptors / Indicators: Sharp, Constant, Throbbing Pain Frequency: Constant     Nutritional Risks: None Diabetes: No  How often do you need to have someone help you when you read instructions, pamphlets, or other written materials from your doctor or pharmacy?: 1 - Never  Diabetic? no  Interpreter Needed?: No  Information entered by :: C.Ruey Storer LPN   Activities of Daily Living    02/25/2023    9:43 AM 02/16/2023   10:38 AM  In your present state of health, do you have any difficulty performing the following activities:  Hearing? 0   Vision? 0   Difficulty concentrating or making decisions? 0   Walking or climbing stairs? 1   Comment Back Pain, hip pain   Dressing or bathing? 0   Doing errands, shopping? 0 0  Preparing Food and eating ? N   Using the Toilet? N   In  the past six months, have you accidently leaked urine? Y   Comment Occasionally when waits to long   Do you have problems with loss of bowel control? N   Managing your Medications? N   Managing your Finances? N   Housekeeping or managing your Housekeeping? N     Patient Care Team: Mort Sawyers, FNP as PCP - General (Family  Medicine) Kathleene Hazel, MD as PCP - Cardiology (Cardiology) Carmina Miller, MD as Referring Physician (Radiation Oncology) Linus Salmons, MD (Otolaryngology) Venetia Night, MD as Consulting Physician (Neurosurgery) Georgiana Spinner, NP as Nurse Practitioner (Vascular Surgery)  Indicate any recent Medical Services you may have received from other than [facility] providers in the past year (date may be approximate).     Assessment:   This is a routine wellness examination for Senetra.  Hearing/Vision screen Hearing Screening - Comments:: No aids Vision Screening - Comments:: Readers - [facility]  Dietary issues and exercise activities discussed: Current Exercise Habits: The patient does not participate in regular exercise at present, Exercise limited by: Other - see comments (Back and hip pain)   Goals Addressed             This Visit's Progress    Patient Stated       Lose 35 pounds.       Depression Screen    02/25/2023    9:40 AM 12/08/2022   10:26 AM 11/24/2022    2:27 PM 03/04/2022   12:00 PM 11/17/2021   10:42 AM 03/01/2018   10:57 AM 03/01/2018   10:53 AM  PHQ 2/9 Scores  PHQ - 2 Score 0 1 1 0 0 0 4  PHQ- 9 Score  2 8   0     Fall Risk    02/25/2023    9:41 AM 12/08/2022   10:26 AM 11/24/2022    2:26 PM 11/11/2022    2:31 PM 03/04/2022   12:05 PM  Fall Risk   Falls in the past year? 1 1 1 1 1   Number falls in past yr: 1 1 1  0 0  Comment    september 23 which is what precipitated the pain   Injury with Fall? 1 0 1  0  Comment Back      Risk for fall due to : Impaired mobility;Other (Comment)   History of fall(s) History of fall(s);Impaired balance/gait;Impaired mobility  Risk for fall due to: Comment Neuropathy in foot      Follow up Falls prevention discussed;Falls evaluation completed;Education provided Falls evaluation completed;Education provided Falls evaluation completed;Education provided Falls evaluation completed;Education provided Falls  prevention discussed    FALL RISK PREVENTION PERTAINING TO THE HOME:  Any stairs in or around the home? Yes  If so, are there any without handrails? No  Home free of loose throw rugs in walkways, pet beds, electrical cords, etc? Yes  Adequate lighting in your home to reduce risk of falls? Yes   ASSISTIVE DEVICES UTILIZED TO PREVENT FALLS:  Life alert? No  Use of a cane, walker or w/c? Yes  Grab bars in the bathroom? Yes  Shower chair or bench in shower? Yes  Elevated toilet seat or a handicapped toilet? No    Cognitive Function:    11/14/2015   10:02 AM  MMSE - Mini Mental State Exam  Orientation to time 5  Orientation to Place 5  Registration 3  Attention/ Calculation 5  Recall 3  Language- name 2 objects 2  Language- repeat 1  Language- follow 3 step command 3  Language- read & follow direction 1  Write a sentence 1  Copy design 1  Total score 30        02/25/2023    9:44 AM 03/04/2022   12:10 PM  6CIT Screen  What Year? 0 points 0 points  What month? 0 points 0 points  What time? 0 points 0 points  Count back from 20 0 points 0 points  Months in reverse 0 points 0 points  Repeat phrase 0 points 0 points  Total Score 0 points 0 points    Immunizations Immunization History  Administered Date(s) Administered   Influenza Split 07/27/2011, 08/25/2012   Influenza Whole 09/14/2007   Influenza,inj,Quad PF,6+ Mos 08/10/2013, 08/01/2015, 07/16/2016, 08/02/2018, 07/15/2019   Influenza,inj,quad, With Preservative 08/13/2017   Influenza-Unspecified 07/26/2021, 08/05/2022   Pneumococcal Conjugate-13 07/16/2016   Pneumococcal Polysaccharide-23 11/27/2011, 03/01/2018    TDAP status: Up to date  Flu Vaccine status: Up to date  Pneumococcal vaccine status: Up to date  Covid-19 vaccine status: Declined, Education has been provided regarding the importance of this vaccine but patient still declined. Advised may receive this vaccine at local pharmacy or Health Dept.or  vaccine clinic. Aware to provide a copy of the vaccination record if obtained from local pharmacy or Health Dept. Verbalized acceptance and understanding.  Qualifies for Shingles Vaccine? Yes   Zostavax completed No   Shingrix Completed?: No.    Education has been provided regarding the importance of this vaccine. Patient has been advised to call insurance company to determine out of pocket expense if they have not yet received this vaccine. Advised may also receive vaccine at local pharmacy or Health Dept. Verbalized acceptance and understanding.  Screening Tests Health Maintenance  Topic Date Due   COVID-19 Vaccine (1) Never done   Hepatitis C Screening  03/08/2023 (Originally 06/13/1974)   HIV Screening  03/08/2023 (Originally 06/13/1971)   Zoster Vaccines- Shingrix (1 of 2) 03/08/2023 (Originally 06/13/1975)   INFLUENZA VACCINE  05/27/2023   MAMMOGRAM  01/02/2024   Lung Cancer Screening  01/15/2024   Medicare Annual Wellness (AWV)  02/25/2024   COLONOSCOPY (Pts 45-58yrs Insurance coverage will need to be confirmed)  03/09/2024   HPV VACCINES  Aged Out   DTaP/Tdap/Td  Discontinued    Health Maintenance  Health Maintenance Due  Topic Date Due   COVID-19 Vaccine (1) Never done    Colorectal cancer screening: Type of screening: Colonoscopy. Completed 03/09/2014. Repeat every 10 years  Mammogram status: Ordered 02/25/23. Pt provided with contact info and advised to call to schedule appt.   Bone Density status: Completed 03/02/2022. Results reflect: Bone density results: OSTEOPENIA. Repeat every 2 years.  Lung Cancer Screening: (Low Dose CT Chest recommended if Age 37-80 years, 30 pack-year currently smoking OR have quit w/in 15years.) does qualify.   Lung Cancer Screening Referral: ordered 02/16/23  Additional Screening:  Hepatitis C Screening: does qualify; Completed Due at next visit.  Vision Screening: Recommended annual ophthalmology exams for early detection of glaucoma  and other disorders of the eye. Is the patient up to date with their annual eye exam?  Yes  Who is the provider or what is the name of the office in which the patient attends annual eye exams? [facility] If pt is not established with a provider, would they like to be referred to a provider to establish care? No .   Dental Screening: Recommended annual dental exams for proper oral hygiene  Community Resource Referral /  Chronic Care Management: CRR required this visit?  No   CCM required this visit?  No      Plan:     I have personally reviewed and noted the following in the patient's chart:   Medical and social history Use of alcohol, tobacco or illicit drugs  Current medications and supplements including opioid prescriptions. Patient is not currently taking opioid prescriptions. Functional ability and status Nutritional status Physical activity Advanced directives List of other physicians Hospitalizations, surgeries, and ER visits in previous 12 months Vitals Screenings to include cognitive, depression, and falls Referrals and appointments  In addition, I have reviewed and discussed with patient certain preventive protocols, quality metrics, and best practice recommendations. A written personalized care plan for preventive services as well as general preventive health recommendations were provided to patient.     Maryan Puls, LPN   10/31/1094   Nurse Notes: Pt having lumbar fusion surgery on 03/01/23.

## 2023-02-25 NOTE — Telephone Encounter (Addendum)
The antibiotics will be given to her in the operating room through her IV. I called her and discussed this with her.  She also inquired if she can use Nair hair remover prior to surgery since shaving is not allowed. Per Quentin Mulling, NP, I let her know that this is OK and she can shave up until 48 hours prior to surgery.

## 2023-02-25 NOTE — Telephone Encounter (Signed)
Procedure Component Value Ref Range Date/Time  Surgical pcr screen [[id number]] (Abnormal) Collected: 02/16/23 0959  Order Status: Completed Specimen: Nasal Swab from Nasal Mucosa Updated: 02/16/23 1440   MRSA, PCR POSITIVE Abnormal  NEGATIVE    Comment: RESULT CALLED TO, READ BACK BY AND VERIFIED WITH: BRYAN GRAY 02/16/23 1439 KLW     Staphylococcus aureus POSITIVE Abnormal  NEGATIVE    Comment: (NOTE) The Xpert SA Assay (FDA approved for NASAL specimens in patients 23 years of age and older), is one component of a comprehensive surveillance program. It is not intended to diagnose infection nor to guide or monitor treatment. Performed at [facility], [address], [place], [place] [address]

## 2023-02-25 NOTE — Telephone Encounter (Signed)
Scheduled for surgery on Monday, but says that Pre-op tested you for MRSA and according to results on MyChart, she was positive. Was told by Pre-op if she tested positive she would receive a script for an antibiotic to treat but has not heard anything else about it.  Please advise  CB: [phone]

## 2023-02-25 NOTE — Patient Instructions (Signed)
Heather Beasley , Thank you for taking time to come for your Medicare Wellness Visit. I appreciate your ongoing commitment to your health goals. Please review the following plan we discussed and let me know if I can assist you in the future.   These are the goals we discussed:  Goals      Healthy Lifestyle     Stay hydrated, drink plenty of water Make healthy choices when eating Start the [employer] Program with the [facility]     Patient Stated     Lose 35 pounds.     Weight (lb) < 200 lb (90.7 kg)        This is a list of the screening recommended for you and due dates:  Health Maintenance  Topic Date Due   COVID-19 Vaccine (1) Never done   Hepatitis C Screening: USPSTF Recommendation to screen - Ages 18-79 yo.  03/08/2023*   HIV Screening  03/08/2023*   Zoster (Shingles) Vaccine (1 of 2) 03/08/2023*   Flu Shot  05/27/2023   Mammogram  01/02/2024   Screening for Lung Cancer  01/15/2024   Medicare Annual Wellness Visit  02/25/2024   Colon Cancer Screening  03/09/2024   HPV Vaccine  Aged Out   DTaP/Tdap/Td vaccine  Discontinued  *Topic was postponed. The date shown is not the original due date.    Advanced directives: none  Conditions/risks identified: Aim for 30 minutes of exercise or brisk walking, 6-8 glasses of water, and 5 servings of fruits and vegetables each day.   Next appointment: Follow up in one year for your annual wellness visit. 02/28/24 @ 2:30 televisit.  Preventive Care 40-64 Years, Female Preventive care refers to lifestyle choices and visits with your health care provider that can promote health and wellness. What does preventive care include? A yearly physical exam. This is also called an annual well check. Dental exams once or twice a year. Routine eye exams. Ask your health care provider how often you should have your eyes checked. Personal lifestyle choices, including: Daily care of your teeth and gums. Regular physical activity. Eating a healthy  diet. Avoiding tobacco and drug use. Limiting alcohol use. Practicing safe sex. Taking low-dose aspirin daily starting at age 47. Taking vitamin and mineral supplements as recommended by your health care provider. What happens during an annual well check? The services and screenings done by your health care provider during your annual well check will depend on your age, overall health, lifestyle risk factors, and family history of disease. Counseling  Your health care provider may ask you questions about your: Alcohol use. Tobacco use. Drug use. Emotional well-being. Home and relationship well-being. Sexual activity. Eating habits. Work and work [employer]. Method of birth control. Menstrual cycle. Pregnancy history. Screening  You may have the following tests or measurements: Height, weight, and BMI. Blood pressure. Lipid and cholesterol levels. These may be checked every 5 years, or more frequently if you are over 3 years old. Skin check. Lung cancer screening. You may have this screening every year starting at age 61 if you have a 30-pack-year history of smoking and currently smoke or have quit within the past 15 years. Fecal occult blood test (FOBT) of the stool. You may have this test every year starting at age 21. Flexible sigmoidoscopy or colonoscopy. You may have a sigmoidoscopy every 5 years or a colonoscopy every 10 years starting at age 87. Hepatitis C blood test. Hepatitis B blood test. Sexually transmitted disease (STD) testing. Diabetes screening.  This is done by checking your blood sugar (glucose) after you have not eaten for a while (fasting). You may have this done every 1-3 years. Mammogram. This may be done every 1-2 years. Talk to your health care provider about when you should start having regular mammograms. This may depend on whether you have a family history of breast cancer. BRCA-related cancer screening. This may be done if you have a family history of  breast, ovarian, tubal, or peritoneal cancers. Pelvic exam and Pap test. This may be done every 3 years starting at age 62. Starting at age 6, this may be done every 5 years if you have a Pap test in combination with an HPV test. Bone density scan. This is done to screen for osteoporosis. You may have this scan if you are at high risk for osteoporosis. Discuss your test results, treatment options, and if necessary, the need for more tests with your health care provider. Vaccines  Your health care provider may recommend certain vaccines, such as: Influenza vaccine. This is recommended every year. Tetanus, diphtheria, and acellular pertussis (Tdap, Td) vaccine. You may need a Td booster every 10 years. Zoster vaccine. You may need this after age 4. Pneumococcal 13-valent conjugate (PCV13) vaccine. You may need this if you have certain conditions and were not previously vaccinated. Pneumococcal polysaccharide (PPSV23) vaccine. You may need one or two doses if you smoke cigarettes or if you have certain conditions. Talk to your health care provider about which screenings and vaccines you need and how often you need them. This information is not intended to replace advice given to you by your health care provider. Make sure you discuss any questions you have with your health care provider. Document Released: 11/08/2015 Document Revised: 07/01/2016 Document Reviewed: 08/13/2015 Elsevier Interactive Patient Education  2017 [place] Prevention in the Home Falls can cause injuries. They can happen to people of all ages. There are many things you can do to make your home safe and to help prevent falls. What can I do on the outside of my home? Regularly fix the edges of walkways and driveways and fix any cracks. Remove anything that might make you trip as you walk through a door, such as a raised step or threshold. Trim any bushes or trees on the path to your home. Use bright outdoor  lighting. Clear any walking paths of anything that might make someone trip, such as rocks or tools. Regularly check to see if handrails are loose or broken. Make sure that both sides of any steps have handrails. Any raised decks and porches should have guardrails on the edges. Have any leaves, snow, or ice cleared regularly. Use sand or salt on walking paths during winter. Clean up any spills in your garage right away. This includes oil or grease spills. What can I do in the bathroom? Use night lights. Install grab bars by the toilet and in the tub and shower. Do not use towel bars as grab bars. Use non-skid mats or decals in the tub or shower. If you need to sit down in the shower, use a plastic, non-slip stool. Keep the floor dry. Clean up any water that spills on the floor as soon as it happens. Remove soap buildup in the tub or shower regularly. Attach bath mats securely with double-sided non-slip rug tape. Do not have throw rugs and other things on the floor that can make you trip. What can I do in the bedroom?  Use night lights. Make sure that you have a light by your bed that is easy to reach. Do not use any sheets or blankets that are too big for your bed. They should not hang down onto the floor. Have a firm chair that has side arms. You can use this for support while you get dressed. Do not have throw rugs and other things on the floor that can make you trip. What can I do in the kitchen? Clean up any spills right away. Avoid walking on wet floors. Keep items that you use a lot in easy-to-reach places. If you need to reach something above you, use a strong step stool that has a grab bar. Keep electrical cords out of the way. Do not use floor polish or wax that makes floors slippery. If you must use wax, use non-skid floor wax. Do not have throw rugs and other things on the floor that can make you trip. What can I do with my stairs? Do not leave any items on the stairs. Make  sure that there are handrails on both sides of the stairs and use them. Fix handrails that are broken or loose. Make sure that handrails are as long as the stairways. Check any carpeting to make sure that it is firmly attached to the stairs. Fix any carpet that is loose or worn. Avoid having throw rugs at the top or bottom of the stairs. If you do have throw rugs, attach them to the floor with carpet tape. Make sure that you have a light switch at the top of the stairs and the bottom of the stairs. If you do not have them, ask someone to add them for you. What else can I do to help prevent falls? Wear shoes that: Do not have high heels. Have rubber bottoms. Are comfortable and fit you well. Are closed at the toe. Do not wear sandals. If you use a stepladder: Make sure that it is fully opened. Do not climb a closed stepladder. Make sure that both sides of the stepladder are locked into place. Ask someone to hold it for you, if possible. Clearly mark and make sure that you can see: Any grab bars or handrails. First and last steps. Where the edge of each step is. Use tools that help you move around (mobility aids) if they are needed. These include: Canes. Walkers. Scooters. Crutches. Turn on the lights when you go into a dark area. Replace any light bulbs as soon as they burn out. Set up your furniture so you have a clear path. Avoid moving your furniture around. If any of your floors are uneven, fix them. If there are any pets around you, be aware of where they are. Review your medicines with your doctor. Some medicines can make you feel dizzy. This can increase your chance of falling. Ask your doctor what other things that you can do to help prevent falls. This information is not intended to replace advice given to you by your health care provider. Make sure you discuss any questions you have with your health care provider. Document Released: 08/08/2009 Document Revised: 03/19/2016  Document Reviewed: 11/16/2014 Elsevier Interactive Patient Education  2017 [employer].

## 2023-02-25 NOTE — Progress Notes (Signed)
Perioperative / Anesthesia Services  Pre-Admission Testing Clinical Review / Preoperative Anesthesia Consult  Date: 02/26/23  Patient Demographics:  Name: Heather Beasley DOB:   1956/02/13 MRN:   [id number]  Planned Surgical Procedure(s):    Case: [id number] Date/Time: 03/01/23 1223   Procedures:      L3-4 LATERAL LUMBAR INTERBODY FUSION     L3-S1 POSTERIOR SPINAL FUSION     APPLICATION OF INTRAOPERATIVE CT SCAN   Anesthesia type: General   Pre-op diagnosis: M51.36, M43.16  Lumbar adjacent segment disease with spondylolisthesis   Location: [facility] OR ROOM 03 / [facility]   Surgeons: Venetia Night, MD     NOTE: Available PAT nursing documentation and vital signs have been reviewed. Clinical nursing staff has updated patient's PMH/PSHx, current medication list, and drug allergies/intolerances to ensure comprehensive history available to assist in medical decision making as it pertains to the aforementioned surgical procedure and anticipated anesthetic course. Extensive review of available clinical information personally performed. [place] PMH and PSHx updated with any diagnoses/procedures that  may have been inadvertently omitted during her intake with the pre-admission testing department's nursing staff.  Clinical Discussion:  Heather Beasley is a 67 y.o. female who is submitted for pre-surgical anesthesia review and clearance prior to her undergoing the above procedure. Patient is a Former Smoker (39 pack years; quit 11/2022). Pertinent PMH includes: CAD, diastolic dysfunction, PFO, BILATERAL carotid artery disease, aortic atherosclerosis, cardiac murmur, HTN, HLD, asthma, COPD, GERD (on PPI + H2 blocker), nephrolithiasis, glaucoma, OA, neuropathy, cervical DDD (s/p ACDF C3-C6), lumbar DDD with spondylolisthesis, depression.   Patient is followed by cardiology Sanjuana Kava, MD). She was last seen in the cardiology clinic on 01/12/2023; notes reviewed. At the time of  her clinic visit, patient doing well overall from a cardiovascular perspective. Patient denied any chest pain, shortness of breath, PND, orthopnea, palpitations, significant peripheral edema, weakness, fatigue, or presyncope/syncope.  Patient was experiencing headaches and vertiginous symptoms associated with the use of her diuretic medication (HCTZ).  Patient self discontinued this medication with improvement in symptoms. Patient with a past medical history significant for cardiovascular diagnoses. Documented physical exam was grossly benign, providing no evidence of acute exacerbation and/or decompensation of the patient's known cardiovascular conditions.  Patient with a history of significant BILATERAL carotid artery disease..  Carotid Doppler performed on 06/07/2020 revealed a 1-39% stenosis of the RICA, with a contralateral 80-99% stenosis of the LICA.  Patient underwent PTA of the LICA on 06/27/2020, at which time a 9-7 x 40 x 136 mm XACT bare-metal stent was placed.  Follow-up carotid Doppler studies have demonstrated mild disease.  Most recent carotid Doppler was performed on 05/19/2022 revealing a 1-39% RICA with no significant residual disease/stenosis contralaterally.  Most recent TTE was performed on 01/21/2022 revealing a normal left ventricular systolic function with a hyperdynamic LVEF of 70-75%.  There were no regional wall motion abnormalities.  There was moderate asymmetric LVH of the septal segment. Left ventricular diastolic Doppler parameters consistent with abnormal relaxation (G1DD).  Right ventricular size and function normal.  There was mild mitral valve regurgitation. All transvalvular gradients were noted to be normal providing no evidence suggestive of valvular stenosis.  Coronary CTA performed on 01/26/2022 revealed an elevated coronary calcium score of 80.3.  This was in the 82nd percentile for age, race, and sex matched controls.  Patient with normal coronary origin with RIGHT  dominance.  Minimal plaque in the LAD and LCx territories.  There was an incidental finding  of a PFO with LEFT to RIGHT contrast wound.  Given patient's history of carotid artery disease and subsequent PTA with stent placement, patient remains on daily antithrombotic therapy using clopidogrel.  Patient reported to be compliant with therapy with no evidence or reports of GI bleeding.  Blood pressure well controlled at 124/86 mmHg on currently prescribed CCB (amlodipine), diuretic (furosemide as needed), and ARB (losartan) therapies.  With discontinuation of the HCTZ, patient reporting that systolic blood pressures are running in the 140s in the mornings.  Patient is on atorvastatin for her HLD diagnosis and ASCVD prevention.  Patient is not diabetic. Patient does not have an OSAH diagnosis.  Functional capacity limited by acute vertiginous symptoms, neuropathy, and back pain.  With that being said, patient still felt to be able to achieve at least 4 METS of physical activity without experiencing any degree of significant angina/anginal equivalent symptoms.  Given elevated blood pressures in the morning hours, amlodipine dose changed to 5 mg in the morning and 5 mg in the evening for total of a 10 mg dose daily.  No other changes were made to her medication regimen.  Patient to follow-up with outpatient cardiology in 6 months or sooner if needed.  META PUGLIESE is scheduled for an L3-4 LATERAL LUMBAR INTERBODY FUSION; L3-S1 POSTERIOR SPINAL FUSION on 03/01/2023 with Dr. Venetia Night, MD.  Given patient's past medical history significant for cardiovascular diagnoses, presurgical cardiac clearance was sought by the PAT team. Per cardiology, "Ms. Danese is planning to undergo back surgery in the next 2 months. She has been doing well without any new cardiac symptoms. She is able to achieve 5 METS without cardiac limitations. Therefore, based on ACC/AHA guidelines, the patient would be at ACCEPTABLE risk for  the planned procedure without further cardiovascular testing".     In review of her medication reconciliation, it is noted that patient is currently on prescribed daily antithrombotic therapy. She has been instructed on recommendations for holding her clopidogrel for 7 days prior to her procedure with plans to restart 14 days postoperatively, or soon as postoperative bleeding risk felt to be minimized by her attending surgeon. The patient has been instructed that her last dose of her clopidogrel should be on 02/21/2023.  Patient reports previous perioperative complications with anesthesia in the past. Patient has a PMH (+) for PONV. Symptoms and history of PONV will be discussed with patient by anesthesia team on the day of her procedure. Interventions will be ordered as deemed necessary based on patient's individual care needs as determined by anesthesiologist. In review of the available records, it is noted that patient underwent a general anesthetic course here at [facility] in 05/2015 without documented complications.      02/25/2023    9:19 AM 02/16/2023   10:04 AM 01/28/2023    9:44 AM  Vitals with BMI  Height 5\' 5"  5\' 5"  5\' 5"   Weight 195 lbs 196 lbs 10 oz 192 lbs 6 oz  BMI 32.45 32.72 32.02  Systolic  138 130  Diastolic  76 84  Pulse  86 86    Providers/Specialists:   NOTE: Primary physician provider listed below. Patient may have been seen by APP or partner within same practice.   PROVIDER ROLE / SPECIALTY LAST Donalynn Furlong, MD Neurosurgery (Surgeon)  02/02/2023  Mort Sawyers, FNP Primary Care Provider 12/08/2022  Verne Carrow, MD Cardiology 01/12/2023  Sarina Ser, MD Pulmonary Medicine 01/28/2023   Allergies:  Albumin (human), Amoxicillin-pot clavulanate, Gabapentin, Latex, Betadine [povidone iodine], Sucralfate, Amoxicillin, Chantix [varenicline tartrate], Erythromycin, and Sucralfate  Current Home  Medications:   No current facility-administered medications for this encounter.    acetaminophen (TYLENOL) 650 MG CR tablet   albuterol (VENTOLIN HFA) 108 (90 Base) MCG/ACT inhaler   amLODipine (NORVASC) 5 MG tablet   atorvastatin (LIPITOR) 20 MG tablet   Budeson-Glycopyrrol-Formoterol (BREZTRI AEROSPHERE) 160-9-4.8 MCG/ACT AERO   cetirizine (ZYRTEC) 10 MG tablet   Cholecalciferol (VITAMIN D) 50 MCG (2000 UT) CAPS   clopidogrel (PLAVIX) 75 MG tablet   cyclobenzaprine (FLEXERIL) 10 MG tablet   EPINEPHrine 0.3 mg/0.3 mL IJ SOAJ injection   Famotidine (PEPCID PO)   furosemide (LASIX) 20 MG tablet   losartan (COZAAR) 100 MG tablet   montelukast (SINGULAIR) 10 MG tablet   Multiple Vitamins-Minerals (CENTRUM ADULTS PO)   oxymetazoline (AFRIN) 0.05 % nasal spray   pantoprazole (PROTONIX) 40 MG tablet   vitamin B-12 (CYANOCOBALAMIN) 1000 MCG tablet   History:   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; T1 N0 M0; s/p adjuvant XRT; did not require systemic chemotherapy   Cervical spondylosis    Colitis    Colon polyps    COPD (chronic obstructive pulmonary disease) (HCC)    Coronary artery disease    a.) cCTA 01/26/2022: Ca score 80.3 (82nd percentile for age/sex match control)   DDD (degenerative disc disease), cervical    a.) ACDF C3-C6   Depression    Diastolic dysfunction    a.) TTE 06/30/2013: EF 55-6%, mild LAE, mild MR, G1DD; b.) TTE 01/21/2022: EF 70-75%, mod LVH, mild MR, G1DD   Diverticulosis    GERD (gastroesophageal reflux disease)     Glaucoma    she reports that she does not have glacoma, she has thick corneas   Headaches, cluster    Heart murmur    HTN (hypertension)    Hyperlipidemia    IBS (irritable bowel syndrome)    Kidney stones    Long term current use of antithrombotics/antiplatelets    a.) clopidogrel   Lumbar adjacent segment disease with spondylolisthesis    Melanoma (HCC)    above the L knee    Neuropathy    Neuropathy of left lower extremity    Nose colonized with MRSA 02/16/2023   a.) surgical PCR (+) 02/16/2023 prior to L3-4 LATERAL LUMBAR INTERBODY FUSION; L3-S1 POSTERIOR SPINAL FUSION   PFO (patent foramen ovale)    a.) cCTA 01/26/2022 --> PFO with L to R contrast flow   Pneumonia    PONV (postoperative nausea and vomiting)    2012 - during lumbar fusion she developed spinal fluid leak, and had to have 3 units on Blood, BP dropped, and in ICU,   Tobacco abuse    Past Surgical History:  Procedure Laterality Date   ABDOMINAL HYSTERECTOMY  1998   APPENDECTOMY  1998   BACK SURGERY     CAROTID PTA/STENT INTERVENTION Left 06/27/2020   Procedure: CAROTID PTA/STENT INTERVENTION;  Surgeon: Annice Needy, MD;  Location: [facility] INVASIVE CV [facility];  Service: Cardiovascular;  Laterality: Left;   CATARACT EXTRACTION     CERVICAL FUSION  2010   C3-C6   CHOLECYSTECTOMY  08/2011   Procedure: Cholecystectomy; Location: [facility]; Surgeon: Renda Rolls, MD   COLONOSCOPY N/A    Procedure: COLONOSCOPY; Location: [facility]; Surgeon: Lynnae Prude, MD   CYSTOSCOPY N/A    Procedure: CYSTOSCOPY; Location: [facility]; Surgeon: Irineo Axon, MD   DILATION AND CURETTAGE OF UTERUS     ESOPHAGOGASTRODUODENOSCOPY  2012   Procedure: ESOPHAGOGASTRODUODENOSCOPY; Location: [facility]; Surgeon: Lynnae Prude, MD   FRACTURE SURGERY  2001   plate in right wrist and arm   IMAGE GUIDED SINUS SURGERY N/A 06/25/2015   Procedure: IMAGE GUIDED SINUS SURGERY;  Surgeon: Linus Salmons, MD;  Location: [facility] ORS;  Service: ENT;  Laterality: N/A;    LUMBAR SPINE SURGERY  07/2004   OOPHORECTOMY     POLYPECTOMY N/A 06/25/2015   Procedure: POLYPECTOMY NASAL;  Surgeon: Linus Salmons, MD;  Location: [facility] ORS;  Service: ENT;  Laterality: N/A;   TONSILLECTOMY  1968   VAGINAL DELIVERY     x2   Family History  Problem Relation Age of Onset   Hypertension Mother    Hyperlipidemia Mother    Diabetes Mother    Colon cancer Father    Pancreatic cancer Father    Cancer Father        Colon & Pancreatic   Breast cancer Paternal Grandmother 75   Diabetes Paternal Grandmother    Coronary artery disease Paternal Grandmother    Heart failure Paternal Grandmother    Cancer Paternal Grandmother        breast   Stroke Brother    Social History   Tobacco Use   Smoking status: Former    Packs/day: 1.00    Years: 39.00    Additional pack years: 0.00    Total pack years: 39.00    Types: Cigarettes    Quit date: 12/19/2022    Years since quitting: 0.1   Smokeless tobacco: Never   Tobacco comments:    Using vape  Vaping Use   Vaping Use: Every day   Substances: Flavoring  Substance Use Topics   Alcohol use: Not Currently   Drug use: No    Pertinent Clinical Results:  LABS:   No visits with results within 3 Day(s) from this visit.  Latest known visit with results is:  Hospital Outpatient Visit on 02/16/2023  Component Date Value Ref Range Status   WBC 02/16/2023 5.7  4.0 - 10.5 K/uL Final   RBC 02/16/2023 4.06  3.87 - 5.11 MIL/uL Final   Hemoglobin 02/16/2023 13.0  12.0 - 15.0 g/dL Final   HCT 96/01/5408 40.5  36.0 - 46.0 % Final   MCV 02/16/2023 99.8  80.0 - 100.0 fL Final   MCH 02/16/2023 32.0  26.0 - 34.0 pg Final   MCHC 02/16/2023 32.1  30.0 - 36.0 g/dL Final   RDW 81/19/1478 12.1  11.5 - 15.5 % Final   Platelets 02/16/2023 263  150 - 400 K/uL Final   nRBC 02/16/2023 0.0  0.0 - 0.2 % Final   Performed at [facility], [address] [place], [place] [address]   ABO/RH(D) 02/16/2023 A POS   Final   Antibody  Screen 02/16/2023 NEG   Final   Sample Expiration 02/16/2023 03/02/2023,2359   Final   Extend sample reason 02/16/2023    Final                   Value:NO  TRANSFUSIONS OR PREGNANCY IN THE PAST 3 MONTHS Performed at [facility], [address] [place], [place] [address]    Color, Urine 02/16/2023 COLORLESS (A)  YELLOW Final   APPearance 02/16/2023 CLEAR (A)  CLEAR Final   Specific Gravity, Urine 02/16/2023 1.006  1.005 - 1.030 Final   pH 02/16/2023 7.0  5.0 - 8.0 Final   Glucose, UA 02/16/2023 NEGATIVE  NEGATIVE mg/dL Final   Hgb urine dipstick 02/16/2023 NEGATIVE  NEGATIVE Final   Bilirubin Urine 02/16/2023 NEGATIVE  NEGATIVE Final   Ketones, ur 02/16/2023 NEGATIVE  NEGATIVE mg/dL Final   Protein, ur 28/41/3244 NEGATIVE  NEGATIVE mg/dL Final   Nitrite 10/28/7251 NEGATIVE  NEGATIVE Final   Leukocytes,Ua 02/16/2023 NEGATIVE  NEGATIVE Final   Performed at [facility], [address] [place], [place] [address]   Cotinine Ql Scrn, Ur 02/16/2023 Negative  Cutoff=300 ng/mL Final   Drug Screen Comment: 02/16/2023 Comment   Final   Comment: (NOTE) This analysis is performed by immunoassay. Positive findings are unconfirmed analytical test results; if results do not support expected clinical finding, confirmation by an alternate methodology is recommended. Patient metabolic variables, specific drug chemistry, and specimen characteristics can affect test outcome. Technical consultation is available at [email or web address], or call toll free [phone]. Performed At: [facility] [address] [place], [place] [id number] Jolene Schimke MD JJ:[id number] Performed At: [facility] [address] [place] [address] [place], [place] [id number] Avis Epley PhD FT:[id number]    MRSA, PCR 02/16/2023 POSITIVE (A)  NEGATIVE Final   Comment: RESULT CALLED TO, READ BACK BY AND VERIFIED WITH: Beaumont Austad 02/16/23 1439 KLW    Staphylococcus aureus 02/16/2023 POSITIVE (A)  NEGATIVE  Final   Comment: (NOTE) The Xpert SA Assay (FDA approved for NASAL specimens in patients 41 years of age and older), is one component of a comprehensive surveillance program. It is not intended to diagnose infection nor to guide or monitor treatment. Performed at [facility], [address] [place], [place] [address]     ECG: Date: 12/11/2022 Time ECG obtained: 1121 AM Rate: 86 bpm Rhythm: normal sinus Axis (leads I and aVF): Normal Intervals: PR 136 ms. QRS 72 ms. QTc 442 ms. ST segment and T wave changes: No evidence of acute ST segment elevation or depression.  L lateral ST and T wave changes Comparison: Similar to previous tracing obtained on 12/31/2021   IMAGING / PROCEDURES: CT CHEST LUNG CA SCREEN LOW DOSE W/O CM  performed on 01/15/2023 Lung-RADS 2, benign appearance or behavior. Continue annual screening with low-dose chest CT without contrast in 12 months. Aortic atherosclerosis  Coronary artery calcification. Emphysema  MR LUMBAR SPINE WO CONTRAST performed on 08/18/2022 Status post posterior instrumented fusion and decompression from L4 through S1 without residual spinal canal or neural foraminal stenosis. Adjacent segment disease at L3-L4 has significantly progressed since 2014 with a prominent central protrusion and moderate facet arthropathy with ligamentum flavum thickening resulting in severe spinal canal stenosis with compression of the cauda equina nerve roots and mild left neural foraminal stenosis. Right subarticular zone protrusion at L2-L3 without evidence of impingement Levocurvature centered at L3.  VAS US CAROTID performed on 05/19/2022 Velocities in the right ICA are consistent with a 1-39% stenosis. Non-hemodynamically significant plaque <50% noted in the CCA.  Patent LEFT carotid stent with no evidence of restenosis.  Bilateral vertebral arteries demonstrate antegrade flow. Normal flow hemodynamics were seen in bilateral subclavian  arteries.   PULMONARY FUNCTION TESTING performed on 03/05/2022  Latest Ref Rng & Units 03/05/2022    3:56 PM  PFT Results  FVC-Pre L 2.52   FVC-Predicted Pre % 75   FVC-Post L 2.64   FVC-Predicted Post % 78   Pre FEV1/FVC % % 64   Post FEV1/FCV % % 63   FEV1-Pre L 1.60   FEV1-Predicted Pre % 62   FEV1-Post L 1.65   DLCO uncorrected ml/min/mmHg 11.65   DLCO UNC% % 56   DLVA Predicted % 57   TLC L 4.74   TLC % Predicted % 91   RV % Predicted % 105    CT CORONARY MORPH W/CTA COR W/SCORE W/CA W/CM &/OR WO/CM performed on 01/26/2022 Coronary calcium score of 80.3. This was 82nd percentile for age-, race-, and sex-matched controls. Normal coronary origin with right dominance. There is minimal plaque in the LAD and left circumflex arteries. CAD-RADS1. PFO with left to right contrast flow.  TRANSTHORACIC ECHOCARDIOGRAM performed on 01/21/2022 Left ventricular ejection fraction, by estimation, is 70 to 75%. The left ventricle has hyperdynamic function. The left ventricle has no regional wall motion abnormalities. There is moderate asymmetric left ventricular hypertrophy of the septal segment. Left ventricular diastolic parameters are consistent with Grade I diastolic dysfunction (impaired relaxation).  Right ventricular systolic function is normal. The right ventricular size is normal. Tricuspid regurgitation signal is inadequate for assessing PA pressure.  The mitral valve is normal in structure. Mild mitral valve regurgitation. No evidence of mitral stenosis.  The aortic valve is normal in structure. Aortic valve regurgitation is not visualized. No aortic stenosis is present.  The inferior vena cava is normal in size with greater than 50% respiratory variability, suggesting right atrial pressure of 3 mmHg.   Impression and Plan:  Heather Beasley has been referred for pre-anesthesia review and clearance prior to her undergoing the planned anesthetic and procedural courses. Available  labs, pertinent testing, and imaging results were personally reviewed by me in preparation for upcoming operative/procedural course. [facility] medical record has been updated following extensive record review and patient interview with PAT staff.   This patient has been appropriately cleared by cardiology with an overall ACCEPTABLE risk of significant perioperative cardiovascular complications. Based on clinical review performed today (02/26/23), barring any significant acute changes in the patient's overall condition, it is anticipated that she will be able to proceed with the planned surgical intervention. Any acute changes in clinical condition may necessitate her procedure being postponed and/or cancelled. Patient will meet with anesthesia team (MD and/or CRNA) on the day of her procedure for preoperative evaluation/assessment. Questions regarding anesthetic course will be fielded at that time.   Pre-surgical instructions were reviewed with the patient during her PAT appointment, and questions were fielded to satisfaction by PAT clinical staff. She has been instructed on which medications that she will need to hold prior to surgery, as well as the ones that have been deemed safe/appropriate to take on the day of her procedure. As part of the general education provided by PAT, patient made aware both verbally and in writing, that she would need to abstain from the use of any illegal substances during her perioperative course.  She was advised that failure to follow the provided instructions could necessitate case cancellation or result in serious perioperative complications up to and including death. Patient encouraged to contact PAT and/or her surgeon's office to discuss any questions or concerns that may arise prior to surgery; verbalized understanding.   Quentin Mulling, MSN, APRN, FNP-C, CEN [facility] Nurse Practitioner Phone: [phone] Fax: [phone] 02/26/23 1:21 PM  NOTE: This note has been prepared using Scientist, clinical (histocompatibility and immunogenetics). Despite my best ability to proofread, there is always the potential that unintentional transcriptional errors may still occur from this process.

## 2023-03-01 ENCOUNTER — Encounter: Payer: Self-pay | Admitting: Neurosurgery

## 2023-03-01 ENCOUNTER — Inpatient Hospital Stay: Payer: Medicare PPO | Admitting: Urgent Care

## 2023-03-01 ENCOUNTER — Encounter
Admission: RE | Disposition: A | Discharge status: Home / Self Care | Payer: Self-pay | Source: Home / Self Care | Attending: Neurosurgery

## 2023-03-01 ENCOUNTER — Other Ambulatory Visit: Payer: Self-pay

## 2023-03-01 ENCOUNTER — Inpatient Hospital Stay
Admission: RE | Admit: 2023-03-01 | Discharge: 2023-03-03 | DRG: 455 | Disposition: A | Discharge status: Home / Self Care | Payer: Medicare PPO | Attending: Neurosurgery | Admitting: Neurosurgery

## 2023-03-01 ENCOUNTER — Inpatient Hospital Stay: Payer: Medicare PPO

## 2023-03-01 DIAGNOSIS — Z9049 Acquired absence of other specified parts of digestive tract: Secondary | ICD-10-CM | POA: Diagnosis not present

## 2023-03-01 DIAGNOSIS — Z87442 Personal history of urinary calculi: Secondary | ICD-10-CM | POA: Diagnosis not present

## 2023-03-01 DIAGNOSIS — Z833 Family history of diabetes mellitus: Secondary | ICD-10-CM

## 2023-03-01 DIAGNOSIS — M21372 Foot drop, left foot: Secondary | ICD-10-CM | POA: Diagnosis present

## 2023-03-01 DIAGNOSIS — Z8349 Family history of other endocrine, nutritional and metabolic diseases: Secondary | ICD-10-CM | POA: Diagnosis not present

## 2023-03-01 DIAGNOSIS — Z880 Allergy status to penicillin: Secondary | ICD-10-CM | POA: Diagnosis not present

## 2023-03-01 DIAGNOSIS — Z8719 Personal history of other diseases of the digestive system: Secondary | ICD-10-CM

## 2023-03-01 DIAGNOSIS — H409 Unspecified glaucoma: Secondary | ICD-10-CM | POA: Diagnosis present

## 2023-03-01 DIAGNOSIS — Z803 Family history of malignant neoplasm of breast: Secondary | ICD-10-CM | POA: Diagnosis not present

## 2023-03-01 DIAGNOSIS — I700 Atherosclerosis of aorta: Secondary | ICD-10-CM | POA: Diagnosis not present

## 2023-03-01 DIAGNOSIS — M47816 Spondylosis without myelopathy or radiculopathy, lumbar region: Secondary | ICD-10-CM | POA: Diagnosis present

## 2023-03-01 DIAGNOSIS — K219 Gastro-esophageal reflux disease without esophagitis: Secondary | ICD-10-CM | POA: Diagnosis present

## 2023-03-01 DIAGNOSIS — Z87891 Personal history of nicotine dependence: Secondary | ICD-10-CM

## 2023-03-01 DIAGNOSIS — Z800 Family history of malignant neoplasm of digestive organs: Secondary | ICD-10-CM

## 2023-03-01 DIAGNOSIS — E785 Hyperlipidemia, unspecified: Secondary | ICD-10-CM | POA: Diagnosis present

## 2023-03-01 DIAGNOSIS — M4316 Spondylolisthesis, lumbar region: Secondary | ICD-10-CM | POA: Diagnosis not present

## 2023-03-01 DIAGNOSIS — I10 Essential (primary) hypertension: Secondary | ICD-10-CM | POA: Diagnosis not present

## 2023-03-01 DIAGNOSIS — M51369 Other intervertebral disc degeneration, lumbar region without mention of lumbar back pain or lower extremity pain: Secondary | ICD-10-CM

## 2023-03-01 DIAGNOSIS — I2510 Atherosclerotic heart disease of native coronary artery without angina pectoris: Secondary | ICD-10-CM | POA: Diagnosis not present

## 2023-03-01 DIAGNOSIS — Z90710 Acquired absence of both cervix and uterus: Secondary | ICD-10-CM | POA: Diagnosis not present

## 2023-03-01 DIAGNOSIS — M5136 Other intervertebral disc degeneration, lumbar region: Principal | ICD-10-CM | POA: Diagnosis present

## 2023-03-01 DIAGNOSIS — Z8249 Family history of ischemic heart disease and other diseases of the circulatory system: Secondary | ICD-10-CM

## 2023-03-01 DIAGNOSIS — Z85820 Personal history of malignant melanoma of skin: Secondary | ICD-10-CM

## 2023-03-01 DIAGNOSIS — J4489 Other specified chronic obstructive pulmonary disease: Secondary | ICD-10-CM | POA: Diagnosis present

## 2023-03-01 DIAGNOSIS — Z981 Arthrodesis status: Secondary | ICD-10-CM | POA: Diagnosis not present

## 2023-03-01 DIAGNOSIS — Z823 Family history of stroke: Secondary | ICD-10-CM

## 2023-03-01 DIAGNOSIS — Z7902 Long term (current) use of antithrombotics/antiplatelets: Secondary | ICD-10-CM | POA: Diagnosis not present

## 2023-03-01 DIAGNOSIS — Z881 Allergy status to other antibiotic agents status: Secondary | ICD-10-CM

## 2023-03-01 DIAGNOSIS — Z91040 Latex allergy status: Secondary | ICD-10-CM

## 2023-03-01 DIAGNOSIS — I739 Peripheral vascular disease, unspecified: Secondary | ICD-10-CM | POA: Diagnosis not present

## 2023-03-01 DIAGNOSIS — M4326 Fusion of spine, lumbar region: Secondary | ICD-10-CM | POA: Diagnosis not present

## 2023-03-01 DIAGNOSIS — Z01818 Encounter for other preprocedural examination: Secondary | ICD-10-CM

## 2023-03-01 DIAGNOSIS — Z888 Allergy status to other drugs, medicaments and biological substances status: Secondary | ICD-10-CM

## 2023-03-01 HISTORY — PX: APPLICATION OF INTRAOPERATIVE CT SCAN: SHX6668

## 2023-03-01 HISTORY — DX: Patent foramen ovale: Q21.12

## 2023-03-01 HISTORY — DX: Other cervical disc degeneration, unspecified cervical region: M50.30

## 2023-03-01 HISTORY — DX: Disorder of arteries and arterioles, unspecified: I77.9

## 2023-03-01 HISTORY — DX: Other intervertebral disc degeneration, lumbar region without mention of lumbar back pain or lower extremity pain: M51.369

## 2023-03-01 HISTORY — DX: Other ill-defined heart diseases: I51.89

## 2023-03-01 HISTORY — DX: Spondylolisthesis, lumbar region: M51.36

## 2023-03-01 HISTORY — DX: Atherosclerosis of aorta: I70.0

## 2023-03-01 HISTORY — DX: Unspecified mononeuropathy of left lower limb: G57.92

## 2023-03-01 HISTORY — DX: Spondylolisthesis, lumbar region: M43.16

## 2023-03-01 HISTORY — PX: ANTERIOR LUMBAR FUSION: SHX1170

## 2023-03-01 HISTORY — DX: Long term (current) use of antithrombotics/antiplatelets: Z79.02

## 2023-03-01 SURGERY — ANTERIOR LUMBAR FUSION 1 LEVEL
Anesthesia: General | Site: Spine Lumbar

## 2023-03-01 MED ORDER — FENTANYL CITRATE (PF) 100 MCG/2ML IJ SOLN
INTRAMUSCULAR | Status: DC | PRN
  Administered 2023-03-01: 50 ug via INTRAVENOUS
  Administered 2023-03-01: 25 ug via INTRAVENOUS
  Administered 2023-03-01: 100 ug via INTRAVENOUS
  Administered 2023-03-01: 25 ug via INTRAVENOUS

## 2023-03-01 MED ORDER — PROPOFOL 1000 MG/100ML IV EMUL
INTRAVENOUS | Status: AC
  Filled 2023-03-01: qty 100

## 2023-03-01 MED ORDER — REMIFENTANIL HCL 1 MG IV SOLR
INTRAVENOUS | Status: AC
  Filled 2023-03-01: qty 1000

## 2023-03-01 MED ORDER — PROPOFOL 500 MG/50ML IV EMUL
INTRAVENOUS | Status: DC | PRN
  Administered 2023-03-01: 125 ug/kg/min via INTRAVENOUS

## 2023-03-01 MED ORDER — LIDOCAINE HCL (CARDIAC) PF 100 MG/5ML IV SOSY
PREFILLED_SYRINGE | INTRAVENOUS | Status: DC | PRN
  Administered 2023-03-01: 100 mg via INTRAVENOUS

## 2023-03-01 MED ORDER — PHENOL 1.4 % MT LIQD: 1.0000 | OROMUCOSAL | Status: DC | PRN

## 2023-03-01 MED ORDER — ACETAMINOPHEN 500 MG PO TABS
1000.0000 mg | ORAL_TABLET | Freq: Four times a day (QID) | ORAL | Status: DC
  Administered 2023-03-02 – 2023-03-03 (×6): 1000 mg via ORAL
  Filled 2023-03-01 (×7): qty 2

## 2023-03-01 MED ORDER — 0.9 % SODIUM CHLORIDE (POUR BTL) OPTIME
TOPICAL | Status: DC | PRN
  Administered 2023-03-01: 1000 mL

## 2023-03-01 MED ORDER — FAMOTIDINE 20 MG PO TABS
20.0000 mg | ORAL_TABLET | Freq: Every day | ORAL | Status: DC
  Administered 2023-03-01 – 2023-03-02 (×2): 20 mg via ORAL
  Filled 2023-03-01 (×2): qty 1

## 2023-03-01 MED ORDER — IRRISEPT - 450ML BOTTLE WITH 0.05% CHG IN STERILE WATER, USP 99.95% OPTIME
TOPICAL | Status: DC | PRN
  Administered 2023-03-01: 450 mL

## 2023-03-01 MED ORDER — MENTHOL 3 MG MT LOZG: 1.0000 | LOZENGE | OROMUCOSAL | Status: DC | PRN

## 2023-03-01 MED ORDER — SODIUM CHLORIDE 0.9% FLUSH
3.0000 mL | Freq: Two times a day (BID) | INTRAVENOUS | Status: DC
  Administered 2023-03-01 – 2023-03-02 (×2): 3 mL via INTRAVENOUS

## 2023-03-01 MED ORDER — UMECLIDINIUM BROMIDE 62.5 MCG/ACT IN AEPB
1.0000 | INHALATION_SPRAY | Freq: Every day | RESPIRATORY_TRACT | Status: DC
  Administered 2023-03-02: 1 via RESPIRATORY_TRACT
  Filled 2023-03-01: qty 7

## 2023-03-01 MED ORDER — SODIUM CHLORIDE 0.9 % IV SOLN: INTRAVENOUS | Status: DC

## 2023-03-01 MED ORDER — ALBUTEROL SULFATE (2.5 MG/3ML) 0.083% IN NEBU: 2.5000 mg | INHALATION_SOLUTION | Freq: Four times a day (QID) | RESPIRATORY_TRACT | Status: DC | PRN

## 2023-03-01 MED ORDER — MAGNESIUM CITRATE PO SOLN: 1.0000 | Freq: Once | ORAL | Status: DC | PRN

## 2023-03-01 MED ORDER — ACETAMINOPHEN 10 MG/ML IV SOLN
INTRAVENOUS | Status: DC | PRN
  Administered 2023-03-01: 1000 mg via INTRAVENOUS

## 2023-03-01 MED ORDER — PHENYLEPHRINE 80 MCG/ML (10ML) SYRINGE FOR IV PUSH (FOR BLOOD PRESSURE SUPPORT)
PREFILLED_SYRINGE | INTRAVENOUS | Status: DC | PRN
  Administered 2023-03-01: 80 ug via INTRAVENOUS
  Administered 2023-03-01: 160 ug via INTRAVENOUS

## 2023-03-01 MED ORDER — SURGIFLO WITH THROMBIN (HEMOSTATIC MATRIX KIT) OPTIME
TOPICAL | Status: DC | PRN
  Administered 2023-03-01: 1 via TOPICAL

## 2023-03-01 MED ORDER — FENTANYL CITRATE (PF) 100 MCG/2ML IJ SOLN
INTRAMUSCULAR | Status: AC
  Filled 2023-03-01: qty 2

## 2023-03-01 MED ORDER — KETOROLAC TROMETHAMINE 15 MG/ML IJ SOLN
7.5000 mg | Freq: Four times a day (QID) | INTRAMUSCULAR | Status: AC
  Administered 2023-03-01 – 2023-03-02 (×4): 7.5 mg via INTRAVENOUS
  Filled 2023-03-01 (×4): qty 1

## 2023-03-01 MED ORDER — GLYCOPYRROLATE 0.2 MG/ML IJ SOLN
INTRAMUSCULAR | Status: DC | PRN
  Administered 2023-03-01: .2 mg via INTRAVENOUS

## 2023-03-01 MED ORDER — PHENYLEPHRINE HCL (PRESSORS) 10 MG/ML IV SOLN
INTRAVENOUS | Status: AC
  Filled 2023-03-01: qty 1

## 2023-03-01 MED ORDER — POLYETHYLENE GLYCOL 3350 17 G PO PACK
17.0000 g | PACK | Freq: Every day | ORAL | Status: DC | PRN
  Administered 2023-03-03: 17 g via ORAL
  Filled 2023-03-01: qty 1

## 2023-03-01 MED ORDER — VASOPRESSIN 20 UNIT/ML IV SOLN
INTRAVENOUS | Status: AC
  Filled 2023-03-01: qty 2

## 2023-03-01 MED ORDER — LACTATED RINGERS IV SOLN: INTRAVENOUS | Status: DC | PRN

## 2023-03-01 MED ORDER — REMIFENTANIL HCL 1 MG IV SOLR
INTRAVENOUS | Status: DC | PRN
  Administered 2023-03-01: .1 ug/kg/min via INTRAVENOUS

## 2023-03-01 MED ORDER — KETOROLAC TROMETHAMINE 30 MG/ML IJ SOLN
INTRAMUSCULAR | Status: AC
  Filled 2023-03-01: qty 1

## 2023-03-01 MED ORDER — PROPOFOL 10 MG/ML IV BOLUS
INTRAVENOUS | Status: AC
  Filled 2023-03-01: qty 20

## 2023-03-01 MED ORDER — BUDESON-GLYCOPYRROL-FORMOTEROL 160-9-4.8 MCG/ACT IN AERO: 2.0000 | INHALATION_SPRAY | Freq: Two times a day (BID) | RESPIRATORY_TRACT | Status: DC

## 2023-03-01 MED ORDER — CHLORHEXIDINE GLUCONATE 0.12 % MT SOLN
15.0000 mL | Freq: Once | OROMUCOSAL | Status: AC
  Administered 2023-03-01: 15 mL via OROMUCOSAL

## 2023-03-01 MED ORDER — VASOPRESSIN 20 UNIT/ML IV SOLN
INTRAVENOUS | Status: DC | PRN
  Administered 2023-03-01 (×3): .5 [IU] via INTRAVENOUS
  Administered 2023-03-01: 1 [IU] via INTRAVENOUS
  Administered 2023-03-01 (×3): .5 [IU] via INTRAVENOUS
  Administered 2023-03-01 (×3): 1 [IU] via INTRAVENOUS

## 2023-03-01 MED ORDER — ENOXAPARIN SODIUM 40 MG/0.4ML IJ SOSY
40.0000 mg | PREFILLED_SYRINGE | INTRAMUSCULAR | Status: DC
  Administered 2023-03-02: 40 mg via SUBCUTANEOUS
  Filled 2023-03-01: qty 0.4

## 2023-03-01 MED ORDER — SODIUM CHLORIDE 0.9% FLUSH: 3.0000 mL | INTRAVENOUS | Status: DC | PRN

## 2023-03-01 MED ORDER — FENTANYL CITRATE (PF) 100 MCG/2ML IJ SOLN
25.0000 ug | INTRAMUSCULAR | Status: DC | PRN
  Administered 2023-03-01: 25 ug via INTRAVENOUS
  Administered 2023-03-01: 50 ug via INTRAVENOUS
  Administered 2023-03-01 (×3): 25 ug via INTRAVENOUS

## 2023-03-01 MED ORDER — OXYMETAZOLINE HCL 0.05 % NA SOLN: 1.0000 | Freq: Two times a day (BID) | NASAL | Status: DC | PRN

## 2023-03-01 MED ORDER — EPHEDRINE SULFATE (PRESSORS) 50 MG/ML IJ SOLN
INTRAMUSCULAR | Status: DC | PRN
  Administered 2023-03-01: 5 mg via INTRAVENOUS
  Administered 2023-03-01 (×2): 10 mg via INTRAVENOUS

## 2023-03-01 MED ORDER — SODIUM CHLORIDE FLUSH 0.9 % IV SOLN
INTRAVENOUS | Status: AC
  Filled 2023-03-01: qty 20

## 2023-03-01 MED ORDER — OXYCODONE HCL 5 MG PO TABS: 10.0000 mg | ORAL_TABLET | ORAL | Status: DC | PRN

## 2023-03-01 MED ORDER — MONTELUKAST SODIUM 10 MG PO TABS
10.0000 mg | ORAL_TABLET | Freq: Every day | ORAL | Status: DC
  Administered 2023-03-01 – 2023-03-02 (×2): 10 mg via ORAL
  Filled 2023-03-01 (×2): qty 1

## 2023-03-01 MED ORDER — MORPHINE SULFATE (PF) 2 MG/ML IV SOLN: 1.0000 mg | INTRAVENOUS | Status: DC | PRN

## 2023-03-01 MED ORDER — ONDANSETRON HCL 4 MG PO TABS: 4.0000 mg | ORAL_TABLET | Freq: Four times a day (QID) | ORAL | Status: DC | PRN

## 2023-03-01 MED ORDER — ACETAMINOPHEN 10 MG/ML IV SOLN
INTRAVENOUS | Status: AC
  Filled 2023-03-01: qty 100

## 2023-03-01 MED ORDER — ORAL CARE MOUTH RINSE: 15.0000 mL | Freq: Once | OROMUCOSAL | Status: AC

## 2023-03-01 MED ORDER — MOMETASONE FURO-FORMOTEROL FUM 200-5 MCG/ACT IN AERO
2.0000 | INHALATION_SPRAY | Freq: Two times a day (BID) | RESPIRATORY_TRACT | Status: DC
  Administered 2023-03-02 (×2): 2 via RESPIRATORY_TRACT
  Filled 2023-03-01: qty 8.8

## 2023-03-01 MED ORDER — SODIUM CHLORIDE 0.9 % IV SOLN: 250.0000 mL | INTRAVENOUS | Status: DC

## 2023-03-01 MED ORDER — ONDANSETRON HCL 4 MG/2ML IJ SOLN
INTRAMUSCULAR | Status: DC | PRN
  Administered 2023-03-01: 4 mg via INTRAVENOUS

## 2023-03-01 MED ORDER — DROPERIDOL 2.5 MG/ML IJ SOLN: 0.6250 mg | Freq: Once | INTRAMUSCULAR | Status: DC | PRN

## 2023-03-01 MED ORDER — BISACODYL 10 MG RE SUPP: 10.0000 mg | Freq: Every day | RECTAL | Status: DC | PRN

## 2023-03-01 MED ORDER — CEFAZOLIN SODIUM-DEXTROSE 2-4 GM/100ML-% IV SOLN
INTRAVENOUS | Status: AC
  Filled 2023-03-01: qty 100

## 2023-03-01 MED ORDER — DOCUSATE SODIUM 100 MG PO CAPS
100.0000 mg | ORAL_CAPSULE | Freq: Two times a day (BID) | ORAL | Status: DC
  Administered 2023-03-01 – 2023-03-02 (×3): 100 mg via ORAL
  Filled 2023-03-01 (×3): qty 1

## 2023-03-01 MED ORDER — MIDAZOLAM HCL 2 MG/2ML IJ SOLN
INTRAMUSCULAR | Status: DC | PRN
  Administered 2023-03-01: 2 mg via INTRAVENOUS

## 2023-03-01 MED ORDER — BUPIVACAINE LIPOSOME 1.3 % IJ SUSP
INTRAMUSCULAR | Status: AC
  Filled 2023-03-01: qty 20

## 2023-03-01 MED ORDER — LACTATED RINGERS IV SOLN: INTRAVENOUS | Status: DC

## 2023-03-01 MED ORDER — OXYCODONE HCL 5 MG PO TABS
5.0000 mg | ORAL_TABLET | ORAL | Status: DC | PRN
  Administered 2023-03-02 (×2): 5 mg via ORAL
  Filled 2023-03-01 (×2): qty 1

## 2023-03-01 MED ORDER — SENNA 8.6 MG PO TABS
1.0000 | ORAL_TABLET | Freq: Two times a day (BID) | ORAL | Status: DC
  Administered 2023-03-01 – 2023-03-02 (×3): 8.6 mg via ORAL
  Filled 2023-03-01 (×3): qty 1

## 2023-03-01 MED ORDER — CEFAZOLIN SODIUM-DEXTROSE 2-4 GM/100ML-% IV SOLN
2.0000 g | INTRAVENOUS | Status: AC
  Administered 2023-03-01: 2 g via INTRAVENOUS

## 2023-03-01 MED ORDER — SODIUM CHLORIDE (PF) 0.9 % IJ SOLN
INTRAMUSCULAR | Status: AC
  Filled 2023-03-01: qty 40

## 2023-03-01 MED ORDER — CEFAZOLIN IN SODIUM CHLORIDE 2-0.9 GM/100ML-% IV SOLN
2.0000 g | Freq: Once | INTRAVENOUS | Status: DC
  Filled 2023-03-01: qty 100

## 2023-03-01 MED ORDER — ONDANSETRON HCL 4 MG/2ML IJ SOLN: 4.0000 mg | Freq: Four times a day (QID) | INTRAMUSCULAR | Status: DC | PRN

## 2023-03-01 MED ORDER — DEXAMETHASONE SODIUM PHOSPHATE 10 MG/ML IJ SOLN
INTRAMUSCULAR | Status: DC | PRN
  Administered 2023-03-01: 10 mg via INTRAVENOUS

## 2023-03-01 MED ORDER — BUPIVACAINE HCL (PF) 0.5 % IJ SOLN
INTRAMUSCULAR | Status: AC
  Filled 2023-03-01: qty 60

## 2023-03-01 MED ORDER — EPINEPHRINE PF 1 MG/ML IJ SOLN
INTRAMUSCULAR | Status: AC
  Filled 2023-03-01: qty 1

## 2023-03-01 MED ORDER — KETOROLAC TROMETHAMINE 30 MG/ML IJ SOLN
INTRAMUSCULAR | Status: DC | PRN
  Administered 2023-03-01: 30 mg via INTRAVENOUS

## 2023-03-01 MED ORDER — BUPIVACAINE-EPINEPHRINE (PF) 0.5% -1:200000 IJ SOLN
INTRAMUSCULAR | Status: DC | PRN
  Administered 2023-03-01: 10 mL via PERINEURAL

## 2023-03-01 MED ORDER — ATORVASTATIN CALCIUM 20 MG PO TABS
20.0000 mg | ORAL_TABLET | Freq: Every day | ORAL | Status: DC
  Administered 2023-03-02: 20 mg via ORAL
  Filled 2023-03-01: qty 1

## 2023-03-01 MED ORDER — SUCCINYLCHOLINE CHLORIDE 200 MG/10ML IV SOSY
PREFILLED_SYRINGE | INTRAVENOUS | Status: DC | PRN
  Administered 2023-03-01: 120 mg via INTRAVENOUS

## 2023-03-01 MED ORDER — FUROSEMIDE 20 MG PO TABS: 20.0000 mg | ORAL_TABLET | ORAL | Status: DC | PRN

## 2023-03-01 MED ORDER — VANCOMYCIN HCL IN DEXTROSE 1-5 GM/200ML-% IV SOLN
1000.0000 mg | Freq: Once | INTRAVENOUS | Status: AC
  Administered 2023-03-01: 1000 mg via INTRAVENOUS

## 2023-03-01 MED ORDER — SUCCINYLCHOLINE CHLORIDE 200 MG/10ML IV SOSY
PREFILLED_SYRINGE | INTRAVENOUS | Status: AC
  Filled 2023-03-01: qty 30

## 2023-03-01 MED ORDER — SODIUM CHLORIDE (PF) 0.9 % IJ SOLN
INTRAMUSCULAR | Status: DC | PRN
  Administered 2023-03-01: 60 mL via INTRAMUSCULAR

## 2023-03-01 MED ORDER — LORATADINE 10 MG PO TABS
10.0000 mg | ORAL_TABLET | Freq: Every day | ORAL | Status: DC
  Administered 2023-03-02: 10 mg via ORAL
  Filled 2023-03-01: qty 1

## 2023-03-01 MED ORDER — MIDAZOLAM HCL 2 MG/2ML IJ SOLN
INTRAMUSCULAR | Status: AC
  Filled 2023-03-01: qty 2

## 2023-03-01 MED ORDER — PHENYLEPHRINE HCL-NACL 20-0.9 MG/250ML-% IV SOLN
INTRAVENOUS | Status: DC | PRN
  Administered 2023-03-01: 20 ug/min via INTRAVENOUS

## 2023-03-01 MED ORDER — CYCLOBENZAPRINE HCL 10 MG PO TABS
10.0000 mg | ORAL_TABLET | Freq: Three times a day (TID) | ORAL | Status: DC | PRN
  Administered 2023-03-01 – 2023-03-03 (×3): 10 mg via ORAL
  Filled 2023-03-01 (×3): qty 1

## 2023-03-01 MED ORDER — CHLORHEXIDINE GLUCONATE 0.12 % MT SOLN
OROMUCOSAL | Status: AC
  Filled 2023-03-01: qty 15

## 2023-03-01 MED ORDER — VITAMIN B-12 1000 MCG PO TABS
1000.0000 ug | ORAL_TABLET | Freq: Every day | ORAL | Status: DC
  Administered 2023-03-02: 1000 ug via ORAL
  Filled 2023-03-01: qty 1

## 2023-03-01 MED ORDER — PANTOPRAZOLE SODIUM 40 MG PO TBEC
40.0000 mg | DELAYED_RELEASE_TABLET | Freq: Every day | ORAL | Status: DC
  Administered 2023-03-02: 40 mg via ORAL
  Filled 2023-03-01: qty 1

## 2023-03-01 MED ORDER — PROPOFOL 10 MG/ML IV BOLUS
INTRAVENOUS | Status: DC | PRN
  Administered 2023-03-01: 130 mg via INTRAVENOUS

## 2023-03-01 MED ORDER — AMLODIPINE BESYLATE 5 MG PO TABS
5.0000 mg | ORAL_TABLET | Freq: Two times a day (BID) | ORAL | Status: DC
  Administered 2023-03-02 (×2): 5 mg via ORAL
  Filled 2023-03-01 (×3): qty 1

## 2023-03-01 MED ORDER — LOSARTAN POTASSIUM 50 MG PO TABS
100.0000 mg | ORAL_TABLET | Freq: Every day | ORAL | Status: DC
  Administered 2023-03-02: 100 mg via ORAL
  Filled 2023-03-01 (×2): qty 2

## 2023-03-01 MED ORDER — VANCOMYCIN HCL IN DEXTROSE 1-5 GM/200ML-% IV SOLN
INTRAVENOUS | Status: AC
  Filled 2023-03-01: qty 200

## 2023-03-01 SURGICAL SUPPLY — 84 items
ADH SKN CLS APL DERMABOND .7 (GAUZE/BANDAGES/DRESSINGS) ×3
AGENT HMST KT MTR STRL THRMB (HEMOSTASIS) ×1
ALLOGRAFT BONESTRIP KORE 2.5X5 (Bone Implant) IMPLANT
BASIN KIT SINGLE STR (MISCELLANEOUS) ×1 IMPLANT
BLADE BOVIE TIP EXT 4 (BLADE) IMPLANT
BUR NEURO DRILL SOFT 3.0X3.8M (BURR) ×1 IMPLANT
CNTNR URN SCR LID CUP LEK RST (MISCELLANEOUS) ×1 IMPLANT
CONT SPEC 4OZ STRL OR WHT (MISCELLANEOUS) ×1
COVERAGE SUPP BRAINLAB NG SPNE (MISCELLANEOUS) IMPLANT
COVERAGE SUPPORT SPINE BRAINLB (MISCELLANEOUS)
CUP MEDICINE 2OZ PLAST GRAD ST (MISCELLANEOUS) ×1 IMPLANT
DERMABOND ADVANCED .7 DNX12 (GAUZE/BANDAGES/DRESSINGS) ×1 IMPLANT
DRAPE C ARM PK CFD 31 SPINE (DRAPES) ×1 IMPLANT
DRAPE C-ARMOR (DRAPES) IMPLANT
DRAPE INCISE 23X17 STRL (DRAPES) IMPLANT
DRAPE INCISE IOBAN 23X17 STRL (DRAPES) ×2 IMPLANT
DRAPE LAPAROTOMY 100X77 ABD (DRAPES) ×1 IMPLANT
DRAPE MICROSCOPE SPINE 48X150 (DRAPES) ×1 IMPLANT
DRAPE SCAN PATIENT (DRAPES) ×1 IMPLANT
DRAPE STERI POUCH LG 24X46 STR (DRAPES) IMPLANT
DRSG OPSITE POSTOP 3X4 (GAUZE/BANDAGES/DRESSINGS) IMPLANT
DRSG TEGADERM 4X4.75 (GAUZE/BANDAGES/DRESSINGS) IMPLANT
ELECT CAUTERY BLADE TIP 2.5 (TIP) ×2 IMPLANT
ELECT EMG 20MM DUAL (MISCELLANEOUS) ×1 IMPLANT
ELECT EZSTD 165MM 6.5IN (MISCELLANEOUS) IMPLANT
ELECT REM PT RETURN 9FT ADLT (ELECTROSURGICAL) ×1 IMPLANT
ELECTRODE CAUTERY BLDE TIP 2.5 (TIP) ×2 IMPLANT
ELECTRODE EMG 20MM DUAL (MISCELLANEOUS) IMPLANT
ELECTRODE EZSTD 165MM 6.5IN (MISCELLANEOUS) IMPLANT
ELECTRODE REM PT RTRN 9FT ADLT (ELECTROSURGICAL) ×1 IMPLANT
EMG Needles IMPLANT
EX-PIN ORTHOLOCK NAV 4X150 (PIN) IMPLANT
FEE CVG SUPP BRAINLAB NG SPNE (MISCELLANEOUS) IMPLANT
FORCEP INSUL BAYONET BPLR 6IN (INSTRUMENTS) ×1 IMPLANT
FORCEPS BPLR BAYO 10IN 1.0TIP (ORTHOPEDIC DISPOSABLE SUPPLIES) IMPLANT
FORCEPS INSUL BAYONET BPLR 6IN (INSTRUMENTS) IMPLANT
GAUZE 4X4 16PLY ~~LOC~~+RFID DBL (SPONGE) IMPLANT
GLOVE BIOGEL PI IND STRL 6.5 (GLOVE) ×1 IMPLANT
GLOVE BIOGEL PI IND STRL 8.5 (GLOVE) ×1 IMPLANT
GLOVE SURG SYN 6.5 ES PF (GLOVE) ×2 IMPLANT
GLOVE SURG SYN 6.5 PF PI (GLOVE) ×2 IMPLANT
GLOVE SURG SYN 8.5  E (GLOVE) ×3
GLOVE SURG SYN 8.5 E (GLOVE) ×3 IMPLANT
GLOVE SURG SYN 8.5 PF PI (GLOVE) ×3 IMPLANT
GOWN SRG LRG LVL 4 IMPRV REINF (GOWNS) ×1 IMPLANT
GOWN SRG XL LVL 3 NONREINFORCE (GOWNS) ×1 IMPLANT
GOWN STRL NON-REIN TWL XL LVL3 (GOWNS) ×1
GOWN STRL REIN LRG LVL4 (GOWNS) ×1
GRAFT DURAGEN MATRIX 1WX1L (Tissue) IMPLANT
GUIDEWIRE NITINOL BEVEL TIP (WIRE) IMPLANT
HOLDER FOLEY CATH W/STRAP (MISCELLANEOUS) ×1 IMPLANT
Insulated Bayonet Forceps 10.5" (26.7cm) Smooth 1.0mm Tips IMPLANT
JET LAVAGE IRRISEPT WOUND (IRRIGATION / IRRIGATOR) ×1 IMPLANT
KIT DILATOR XLIF 5 (KITS) IMPLANT
KIT DISP MARS 3V (KITS) IMPLANT
KIT PREVENA INCISION MGT 13 (CANNISTER) ×1 IMPLANT
KIT SPINAL PRONEVIEW (KITS) ×1 IMPLANT
LAVAGE JET IRRISEPT WOUND (IRRIGATION / IRRIGATOR) IMPLANT
MANIFOLD NEPTUNE II (INSTRUMENTS) ×1 IMPLANT
MARKER SKIN DUAL TIP RULER LAB (MISCELLANEOUS) ×1 IMPLANT
MARKER SPHERE PSV REFLC 13MM (MARKER) ×7 IMPLANT
NDL I-PASS III (NEEDLE) IMPLANT
NDL SAFETY ECLIP 18X1.5 (MISCELLANEOUS) ×1 IMPLANT
NEEDLE I-PASS III (NEEDLE) ×1 IMPLANT
NS IRRIG 1000ML POUR BTL (IV SOLUTION) ×1 IMPLANT
PACK LAMINECTOMY NEURO (CUSTOM PROCEDURE TRAY) ×1 IMPLANT
PAD ARMBOARD 7.5X6 YLW CONV (MISCELLANEOUS) ×1 IMPLANT
ROD RELINE MAS LORD 5.5X100MM (Rod) IMPLANT
ROD RELINE MAS LORD 5.5X110MM (Rod) IMPLANT
SCREW LOCK RELINE 5.5 TULIP (Screw) IMPLANT
SCREW RELINE RED 6.5X45MM POLY (Screw) IMPLANT
SCREW RELINE RED 6.5X50MM POLY (Screw) IMPLANT
SOLUTION IRRIG SURGIPHOR (IV SOLUTION) ×1 IMPLANT
SPACER HEDRON 10D 18X45X11 (Spacer) IMPLANT
SURGIFLO W/THROMBIN 8M KIT (HEMOSTASIS) ×1 IMPLANT
SUT DVC VLOC 3-0 CL 6 P-12 (SUTURE) ×1 IMPLANT
SUT VIC AB 0 CT1 27 (SUTURE) ×3
SUT VIC AB 0 CT1 27XCR 8 STRN (SUTURE) ×2 IMPLANT
SUT VIC AB 2-0 CT1 18 (SUTURE) ×2 IMPLANT
SYR 10ML LL (SYRINGE) ×1 IMPLANT
SYR 30ML LL (SYRINGE) ×2 IMPLANT
TOWEL OR 17X26 4PK STRL BLUE (TOWEL DISPOSABLE) ×2 IMPLANT
TRAP FLUID SMOKE EVACUATOR (MISCELLANEOUS) ×1 IMPLANT
WATER STERILE IRR 1000ML POUR (IV SOLUTION) ×2 IMPLANT

## 2023-03-01 NOTE — Anesthesia Procedure Notes (Signed)
Procedure Name: Intubation Date/Time: 03/01/2023 1:01 PM  Performed by: Joanette Gula, Elbert Polyakov, CRNAPre-anesthesia Checklist: Patient identified, Emergency Drugs available, Suction available and Patient being monitored Patient Re-evaluated:Patient Re-evaluated prior to induction Oxygen Delivery Method: Circle system utilized Preoxygenation: Pre-oxygenation with 100% oxygen Induction Type: IV induction Ventilation: Mask ventilation without difficulty Laryngoscope Size: McGraph and 3 Grade View: Grade I Tube type: Oral Tube size: 7.0 mm Number of attempts: 1 Airway Equipment and Method: Stylet Placement Confirmation: ETT inserted through vocal cords under direct vision, positive ETCO2 and breath sounds checked- equal and bilateral Secured at: 20 cm Tube secured with: Tape Dental Injury: Teeth and Oropharynx as per pre-operative assessment

## 2023-03-01 NOTE — Anesthesia Preprocedure Evaluation (Signed)
Anesthesia Evaluation  Patient identified by MRN, date of birth, ID band Patient awake    Reviewed: Allergy & Precautions, NPO status , Patient's Chart, lab work & pertinent test results, reviewed documented beta blocker date and time   History of Anesthesia Complications (+) PONV and history of anesthetic complications  Airway Mallampati: III  TM Distance: >3 FB Neck ROM: Full    Dental  (+) Dental Advidsory Given, Edentulous Upper, Edentulous Lower   Pulmonary shortness of breath and with exertion, asthma , neg sleep apnea, COPD, neg recent URI, former smoker   Pulmonary exam normal        Cardiovascular hypertension, (-) angina + CAD and + Peripheral Vascular Disease  (-) Past MI, (-) Cardiac Stents and (-) CABG Normal cardiovascular exam(-) dysrhythmias + Valvular Problems/Murmurs      Neuro/Psych  Headaches, neg Seizures PSYCHIATRIC DISORDERS Anxiety Depression    Cerebellar ataxia, neuropathy  Neuromuscular disease    GI/Hepatic ,GERD  Medicated and Controlled,,Diverticulosis, colon polyps, IBS   Endo/Other    Renal/GU Kidney stones  negative genitourinary   Musculoskeletal  (+) Arthritis , Osteoarthritis,  Cervical spondylosis   Abdominal Normal abdominal exam  (+)   Peds negative pediatric ROS (+)  Hematology   Anesthesia Other Findings Past Medical History: No date: Allergic rhinitis No date: Aortic atherosclerosis (HCC) No date: Arthritis No date: Asthma No date: Bilateral carotid artery disease (HCC)     Comment:  a.) carotid doppler 06/07/2020 --> 1-39% RICA, 80-99%               LICA; b.) s/p PTA 06/27/2020 --> LICA ([id number] mm XACT              BMS); c.) carotid doppler 07/30/2020, 10/30/2020,               04/30/2021, 05/19/2022 --> 1-39% RICA 07/07/2015: Cancer of nasal cavity and sinus (HCC)     Comment:  a.) 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; T1 N0  M0; s/p adjuvant XRT; did not require               systemic chemotherapy No date: Cervical spondylosis No date: Colitis No date: Colon polyps No date: COPD (chronic obstructive pulmonary disease) (HCC) No date: Coronary artery disease     Comment:  a.) cCTA 01/26/2022: Ca score 80.3 (82nd percentile for               age/sex match control) No date: DDD (degenerative disc disease), cervical     Comment:  a.) ACDF C3-C6 No date: Depression No date: Diastolic dysfunction     Comment:  a.) TTE 06/30/2013: EF 55-6%, mild LAE, mild MR, G1DD;               b.) TTE 01/21/2022: EF 70-75%, mod LVH, mild MR, G1DD No date: Diverticulosis No date: GERD (gastroesophageal reflux disease) No date: Glaucoma     Comment:  she reports that she does not have glacoma, she has               thick corneas No date: Headaches, cluster No date: Heart murmur No date: HTN (hypertension) No date: Hyperlipidemia No date: IBS (irritable bowel syndrome) No date: Kidney stones No date: Long term current use of antithrombotics/antiplatelets     Comment:  a.) clopidogrel No date: Lumbar adjacent segment disease with spondylolisthesis No date: Melanoma (HCC)     Comment:  above the L knee  No date: Neuropathy No date: Neuropathy of left lower extremity 02/16/2023: Nose colonized with MRSA     Comment:  a.) surgical PCR (+) 02/16/2023 prior to L3-4 LATERAL               LUMBAR INTERBODY FUSION; L3-S1 POSTERIOR SPINAL FUSION No date: PFO (patent foramen ovale)     Comment:  a.) cCTA 01/26/2022 --> PFO with L to R contrast flow No date: Pneumonia No date: PONV (postoperative nausea and vomiting)     Comment:  2012 - during lumbar fusion she developed spinal fluid               leak, and had to have 3 units on Blood, BP dropped, and               in ICU, No date: Tobacco abuse   Reproductive/Obstetrics                             Anesthesia Physical Anesthesia Plan  ASA:  3  Anesthesia Plan: General   Post-op Pain Management:    Induction: Intravenous  PONV Risk Score and Plan: 4 or greater and Propofol infusion, TIVA, Treatment may vary due to age or medical condition and Midazolam  Airway Management Planned: Oral ETT  Additional Equipment:   Intra-op Plan:   Post-operative Plan: Extubation in OR  Informed Consent: I have reviewed the patients History and Physical, chart, labs and discussed the procedure including the risks, benefits and alternatives for the proposed anesthesia with the patient or authorized representative who has indicated his/her understanding and acceptance.     Dental advisory given  Plan Discussed with: CRNA and Surgeon  Anesthesia Plan Comments:         Anesthesia Quick Evaluation

## 2023-03-01 NOTE — Op Note (Signed)
Indications: Heather Beasley is a 67 yo female who presented with: M51.36, M43.16 Lumbar adjacent segment disease with spondylolisthesis .  She failed conservative management prompting surgical intervention.  Findings: correction of spondylolisthesis  Preoperative Diagnosis: M51.36, M43.16 Lumbar adjacent segment disease with spondylolisthesis  Postoperative Diagnosis: same   EBL: 100 ml IVF:  see AR ml Drains: none Disposition: Extubated and Stable to PACU Complications: none  A foley catheter was placed.   Preoperative Note:   Risks of surgery discussed include: infection, bleeding, stroke, coma, death, paralysis, CSF leak, nerve/spinal cord injury, numbness, tingling, weakness, complex regional pain syndrome, recurrent stenosis and/or disc herniation, vascular injury, development of instability, neck/back pain, need for further surgery, persistent symptoms, development of deformity, and the risks of anesthesia. The patient understood these risks and agreed to proceed.  NAME OF ANTERIOR PROCEDURE:               1. Anterior lumbar interbody fusion via a right lateral retroperitoneal approach at L3/4 2. Placement of a Lordotic  Globus Hedron interbody cage, filled with Demineralized Bone Matrix  NAME OF POSTERIOR PROCEDURE 1. Posterior instrumentation using Nuvasive Reline Instrumentation, L3-S1 2. Posterolateral fusion, L3/4, utilizing demineralized bone matrix 3. Use of Stereotaxis   PROCEDURE:  Patient was brought to the operating room, intubated, turned to the lateral position.  All pressure points were checked and double-checked.  The patient was prepped and draped in the standard fashion. Prior to prepping, fluoroscopy was brought in and the patient was positioned with a large bump under the contralateral side between the iliac crest and rib cage, allowing the area between the iliac crest and the lateral aspect of the rib cage to open and increase the ability to reach inferiorly, to  facilitate entry into the disc space.  The incision was marked upon the skin both the location of the disc space as well as the superior most aspect of the iliac crest.  Based on the identification of the disc space an incision was prepared, marked upon the skin and eventually was used for our lateral incision.  The fluoroscopy was turned into a cross table A/P image in order to confirm that the patient's spine remained in a perpendicular trajectory to the floor without rotation.  Once confirming that all the pressure points were checked and double-checked and the patient remained in sturdy position strapped down in this slightly jack-knifed lateral position, the patient was prepped and draped in standard fashion.  The skin was injected with local anesthetic, then incised until the abdominal wall fascia was noted.  I bluntly dissected posteriorly until we were able to identify the posterior musculature near petit's triangle.  At this point, using primarily blunt dissection with our finger aided with a metzenbaum scissor, were able to enter the retroperitoneal cavity.  The retroperitoneal potential space was opened further until palpating out the psoas muscle, the medial aspect of the iliac crest, the medial aspect of the last rib and continued to define the retroperitoneal space with blunt dissection in order to facilitate safe placement of our dilators.    While protecting by dissecting directly onto a finger in the retroperitoneum, the retroperitoneal space was entered safely from the lateral incision and the initial dilator placed onto the muscle belly of the psoas.  While directly stimulating the dilator and after radiographically confirming our location relative to the disc space, I placed the dilator through the psoas.  The dilators were stimulated to ensure remaining safely away from any of the lumbar  plexus nerves; the dilators were repositioned until no pathologic stimulation was appreciated.  Once I  had confirmed the location of our initial dilator radiographically, a K-wire secured the dilator into the L4/5 disc space and confirmed position under A/P and lateral fluoroscopy.  At this point, I dilated up with direct stimulation to confirm lack of pathologic stimulation.  Once all the dilators were in position, I placed in the retractor and secured it onto the table, locked into position and confirmed under A/P and lateral fluoroscopy to confirm our approach angle to the disc space as well as location relative to the disc space.  I then placed the muscle stimulator in through the working channel down to the vertebral body, stimulating the entire lateral surface of the vertebral body and any of the visualized psoas muscle that was adjacent to the retractor, confirming again the safe passage to the psoas before we began performing the discectomy.  At this point, we began our discectomy at L4/5.  The disc was incised laterally throughout the extent of our exposure. Using a combination of pituitary rongeurs, Kerrison rongeurs, rasps, curettes of various sorts, we were able to begin to clean out the disc space.  Once we had cleaned out the majority of the disc space, we then cut the lateral annulus with a cob, breaking the lateral annual attachments on the contralateral side by subtly working the cob through the annulus while using flouroscopy.  Care was taken not to extend further than required after cutting the annular attachments.  After this had been performed, we prepared the endplates for placement of our graft, sized a graft to the disc space by serially dilating up in trial sizes until we confirmed that our graft would be well positioned, allowing distraction while maintaining good grip.  This was confirmed under A/P and lateral fluoroscopy in order to ensure its placement as an eventual trial for placement of our final graft.  We irrigated with bacteriostatic saline.  Once confirmed placement, the Hedron  implant filled with allograft was impacted into position at L4/5.   Through a combination of intradiscal distraction and anterior releasing, we were able to correct the anterior deformity during disc preparation and placement of the graft.  At this point, final radiographs were performed, and we began closure.  The wound was closed using 0 Vicryl interrupted suture in the fascia and 2-0 Vicryl inverted suture were placed in the subcutaneous tissue and dermis. 3-0 monocryl was used for final closure. Dermabond was used to close the skin.    After closing the anterior part in layers, the patient was repositioned into prone position.  All pressure points were checked and double-checked.  The posterior operative site was prepped and draped in standard fashion.  The stereotactic array was placed.  Stereotactic images were acquired using intraoperative CT scanning.  This was registered to the patient.  Using stereotaxis, screw trajectories were planned and incisions made.  The pedicles at L3 were cannulated bilaterally and K wires used to secure the tracks.  We then utilized a screwdriver to place pedicle screws at L3.  The prior implants from L4-S1 were removed, then larger sized pedicle screws were placed at each level.  At each level, Nuvasive Reline pedicle screws were placed.  Once the screws were placed, the screw extensions were then linked, a path was formed for the rod and a rod was utilized to connect the screws.  We then compressed, torqued / counter-torqued and removed the screw assembly. Once performed  on each side, the C-arm was brought back and to take confirmatory CT scan showing appropriate placement of all instrumentation and anatomic alignment.    Posterolateral arthrodesis was performed at L3-L4 utilizing demineralized bone matrix.  Again we confirmed radiographically and began our closure.  The wound was closed using 0 Vicryl interrupted suture in the fascia, 2-0 Vicryl inverted  suture were placed in the subcutaneous tissue and dermis. 3-0 monocryl was used for final closure. Dermabond was used to close the skin.    Needle, lap and all counts were correct at the end of the case.    There was no pathologic change in the neuromonitoring during the procedure.   Manning Charity PA assisted in the entire procedure. An assistant was required for this procedure due to the complexity.  The assistant provided assistance in tissue manipulation and suction, and was required for the successful and safe performance of the procedure. I performed the critical portions of the procedure.   Venetia Night MD Neurosurgery

## 2023-03-01 NOTE — Interval H&P Note (Signed)
History and Physical Interval Note:  03/01/2023 11:56 AM  Heather Beasley  has presented today for surgery, with the diagnosis of M51.36, M43.16  Lumbar adjacent segment disease with spondylolisthesis.  The various methods of treatment have been discussed with the patient and family. After consideration of risks, benefits and other options for treatment, the patient has consented to  Procedure(s): L3-4 LATERAL LUMBAR INTERBODY FUSION (N/A) L3-S1 POSTERIOR SPINAL FUSION (N/A) APPLICATION OF INTRAOPERATIVE CT SCAN (N/A) as a surgical intervention.  The patient's history has been reviewed, patient examined, no change in status, stable for surgery.  I have reviewed the patient's chart and labs.  Questions were answered to the patient's satisfaction.    Heart sounds normal no MRG. Chest Clear to Auscultation Bilaterally.    Toniya Rozar

## 2023-03-01 NOTE — Anesthesia Postprocedure Evaluation (Signed)
Anesthesia Post Note  Patient: Heather Beasley  Procedure(s) Performed: L3-4 LATERAL LUMBAR INTERBODY FUSION (Spine Lumbar) L3-S1 POSTERIOR SPINAL FUSION (Spine Lumbar) APPLICATION OF INTRAOPERATIVE CT SCAN (Spine Lumbar)  Patient location during evaluation: PACU Anesthesia Type: General Level of consciousness: awake and alert Pain management: pain level controlled Vital Signs Assessment: post-procedure vital signs reviewed and stable Respiratory status: spontaneous breathing, nonlabored ventilation, respiratory function stable and patient connected to nasal cannula oxygen Cardiovascular status: blood pressure returned to baseline and stable Postop Assessment: no apparent nausea or vomiting Anesthetic complications: no   No notable events documented.   Last Vitals:  Vitals:   03/01/23 1715 03/01/23 1717  BP: (!) 84/48 (!) 100/54  Pulse: 81 79  Resp: 15 13  Temp:    SpO2: 99% 100%    Last Pain:  Vitals:   03/01/23 1727  TempSrc:   PainSc: (P) 10-Worst pain ever                 Louie Boston

## 2023-03-01 NOTE — Transfer of Care (Signed)
Immediate Anesthesia Transfer of Care Note  Patient: Heather Beasley  Procedure(s) Performed: L3-4 LATERAL LUMBAR INTERBODY FUSION (Spine Lumbar) L3-S1 POSTERIOR SPINAL FUSION (Spine Lumbar) APPLICATION OF INTRAOPERATIVE CT SCAN (Spine Lumbar)  Patient Location: PACU  Anesthesia Type:General  Level of Consciousness: drowsy  Airway & Oxygen Therapy: Patient Spontanous Breathing and Patient connected to face mask oxygen  Post-op Assessment: Report given to RN and Post -op Vital signs reviewed and stable  Post vital signs: Reviewed and stable  Last Vitals:  Vitals Value Taken Time  BP 103/47   Temp    Pulse 46 03/01/23 1703  Resp 19 03/01/23 1703  SpO2 83 % 03/01/23 1703  Vitals shown include unvalidated device data.  Last Pain:  Vitals:   03/01/23 1102  TempSrc: Temporal  PainSc: 7          Complications: No notable events documented.

## 2023-03-02 NOTE — Evaluation (Signed)
Physical Therapy Evaluation Patient Details Name: Heather Beasley MRN: [id number] DOB: 1956/01/25 Today's Date: 03/02/2023  History of Present Illness  Patient is a 67 year old female s/p L3-4 XLIF, L3-S1 PSF for spondylolisthesis. History of multiple spine surgeries.  Clinical Impression  Patient is agreeable to PT evaluation. She reports she lives at home with her spouse with a ramped entrance to get into her home. She is ambulatory with a single point cane at baseline and typically sleeps in her recliner chair.  Today, Min guard assistance provided for bed mobility, transfers, and ambulation. Patient walked a lap in the hallway with the rolling walker with no loss of balance and no increased back pain reported with mobility. She wants to try walking with a single point cane during the next session as this is what she uses at baseline. Recommend PT follow up to maximize independence and facilitate return to prior level of function following lumbar spine surgery. Anticipate set-up assistance required at discharge.     Recommendations for follow up therapy are one component of a multi-disciplinary discharge planning process, led by the attending physician.  Recommendations may be updated based on patient status, additional functional criteria and insurance authorization.  Follow Up Recommendations       Assistance Recommended at Discharge Set up Supervision/Assistance  Patient can return home with the following  Assistance with cooking/housework;Help with stairs or ramp for entrance;Assist for transportation    Equipment Recommendations Other (comment) (may need RW, patient wants to decide tomorrow)  Recommendations for Other Services       Functional Status Assessment Patient has had a recent decline in their functional status and demonstrates the ability to make significant improvements in function in a reasonable and predictable amount of time.     Precautions / Restrictions  Precautions Precautions: Fall;Back Precaution Booklet Issued: No Required Braces or Orthoses:  (no brace required) Restrictions Weight Bearing Restrictions: No      Mobility  Bed Mobility Overal bed mobility: Needs Assistance Bed Mobility: Supine to Sit     Supine to sit: Supervision, HOB elevated     General bed mobility comments: cues for logroll technique    Transfers Overall transfer level: Needs assistance Equipment used: Rolling walker (2 wheels) Transfers: Sit to/from Stand Sit to Stand: Min guard           General transfer comment: verbal cues for hand placement for safety with standing    Ambulation/Gait Ambulation/Gait assistance: Min guard Gait Distance (Feet): 200 Feet Assistive device: Rolling walker (2 wheels) Gait Pattern/deviations: Step-through pattern Gait velocity: decreased     General Gait Details: slow but steady cadence with using rolling walker for support. patient requesting to try a single point cane during the next walking session  Stairs            Wheelchair Mobility    Modified Rankin (Stroke Patients Only)       Balance Overall balance assessment: Needs assistance Sitting-balance support: Feet supported Sitting balance-Leahy Scale: Fair     Standing balance support: Bilateral upper extremity supported, During functional activity, Reliant on assistive device for balance Standing balance-Leahy Scale: Poor Standing balance comment: required external support of the rolling walker with no loss of balance noted                             Pertinent Vitals/Pain Pain Assessment Pain Assessment: 0-10 Pain Score: 7  Pain Location: lower back Pain Descriptors /  Indicators: Discomfort Pain Intervention(s): Monitored during session, Limited activity within patient's tolerance, Repositioned    Home Living Family/patient expects to be discharged to:: Private residence Living Arrangements: Spouse/significant  other Available Help at Discharge: Family Type of Home: House Home Access: Ramped entrance       Home Layout: One level Home Equipment: Cane - single point;Shower seat Additional Comments: patient sleeps in a recliner at home typically    Prior Function Prior Level of Function : Independent/Modified Independent             Mobility Comments: using single point cane for ambulation ADLs Comments: using shower chair for bathing     Hand Dominance        Extremity/Trunk Assessment   Upper Extremity Assessment Upper Extremity Assessment: Overall WFL for tasks assessed    Lower Extremity Assessment Lower Extremity Assessment: Overall WFL for tasks assessed       Communication   Communication: No difficulties  Cognition Arousal/Alertness: Awake/alert Behavior During Therapy: WFL for tasks assessed/performed Overall Cognitive Status: Within Functional Limits for tasks assessed                                          General Comments General comments (skin integrity, edema, etc.): Sp02 95% on room air and patient is requesting to leave supplemental oxygen off at this time, alerted nurse    Exercises     Assessment/Plan    PT Assessment Patient needs continued PT services  PT Problem List Decreased strength;Decreased range of motion;Decreased activity tolerance;Decreased balance;Decreased mobility;Decreased safety awareness;Pain       PT Treatment Interventions DME instruction;Gait training;Stair training;Functional mobility training;Therapeutic activities;Therapeutic exercise;Neuromuscular re-education;Balance training;Patient/family education    PT Goals (Current goals can be found in the Care Plan section)  Acute Rehab PT Goals Patient Stated Goal: to return home PT Goal Formulation: With patient Time For Goal Achievement: 03/16/23 Potential to Achieve Goals: Good    Frequency 7X/week     Co-evaluation               AM-PAC PT  "6 Clicks" Mobility  Outcome Measure Help needed turning from your back to your side while in a flat bed without using bedrails?: A Little Help needed moving from lying on your back to sitting on the side of a flat bed without using bedrails?: A Little Help needed moving to and from a bed to a chair (including a wheelchair)?: A Little Help needed standing up from a chair using your arms (e.g., wheelchair or bedside chair)?: A Little Help needed to walk in hospital room?: A Little Help needed climbing 3-5 steps with a railing? : A Little 6 Click Score: 18    End of Session Equipment Utilized During Treatment: Gait belt Activity Tolerance: Patient tolerated treatment well Patient left: in chair;with call bell/phone within reach;with chair alarm set Nurse Communication: Mobility status PT Visit Diagnosis: Other abnormalities of gait and mobility (R26.89);Difficulty in walking, not elsewhere classified (R26.2)    Time: 1610-9604 PT Time Calculation (min) (ACUTE ONLY): 28 min   Charges:   PT Evaluation $PT Eval Low Complexity: 1 Low PT Treatments $Therapeutic Activity: 8-22 mins        Donna Bernard, PT, MPT   Ina Homes 03/02/2023, 11:10 AM

## 2023-03-02 NOTE — Progress Notes (Signed)
Progress Note  History: Heather Beasley is s/p L3-4 XLIF, L3-S1 PSF  POD1: Expected back pain  Physical Exam: Vitals:   03/01/23 2316 03/02/23 0439  BP: 134/70 (!) 142/67  Pulse: 82 84  Resp: 17 16  Temp: 98.6 F (37 C) 97.6 F (36.4 C)  SpO2: 97% 98%    AA Ox3 CNI  Strength:5/5 throughout BLE   Data:  Other tests/results: none  Assessment/Plan:  Heather Beasley is a 67 year old presenting with lumbar spondylolisthesis status post L3-4 XLIF and L3-S1 PSF  - mobilize - pain control - DVT prophylaxis - PTOT; established with [facility] preop.  Final dispo planning pending therapy evaluation  Manning Charity PA-C Department of Neurosurgery

## 2023-03-02 NOTE — Progress Notes (Signed)
OT Cancellation Note  Patient Details Name: Heather Beasley MRN: [id number] DOB: 1956/03/30   Cancelled Treatment:    Reason Eval/Treat Not Completed: OT screened, no needs identified, will sign off. Order received, chart reviewed. Per conversation with PT and pt, pt near baseline functional independence. Good recall of functional application of spinal precautions from prior surgeries. No skilled OT needs identified. Will sign off. Please re-consult if additional needs arise.   Kathie Dike, M.S. OTR/L  03/02/23, 1:26 PM  ascom [phone]

## 2023-03-03 ENCOUNTER — Encounter: Payer: Self-pay | Admitting: Neurosurgery

## 2023-03-03 MED ORDER — CYCLOBENZAPRINE HCL 10 MG PO TABS: 10.0000 mg | ORAL_TABLET | Freq: Three times a day (TID) | ORAL | 0 refills | Status: DC | PRN

## 2023-03-03 MED ORDER — OXYCODONE HCL 5 MG PO TABS: 5.0000 mg | ORAL_TABLET | ORAL | 0 refills | Status: DC | PRN

## 2023-03-03 MED ORDER — SENNA 8.6 MG PO TABS: 1.0000 | ORAL_TABLET | Freq: Two times a day (BID) | ORAL | 0 refills | Status: DC

## 2023-03-03 NOTE — Discharge Summary (Signed)
Discharge Summary  Patient ID: Heather Beasley MRN: [id number] DOB/AGE: 65/17/1957 65 y.o.  Admit date: 03/01/2023 Discharge date: 03/03/2023  Admission Diagnoses:  M51.36, M43.16 Lumbar adjacent segment disease with spondylolisthesis   Discharge Diagnoses:  Principal Problem:   S/P lumbar fusion Active Problems:   Lumbar adjacent segment disease with spondylolisthesis   Discharged Condition: good  Hospital Course:  Heather Beasley is a 67 y.o presenting with symptomatic spondylolisthesis. She underwent L3-4 XLIF and L3-S1 PSF. Her intraoperative course was uncomplicated. She was admitted for pain management and therapy evaluation. She was deemed appropriate for discharge home on POD2. She was given prescriptions for pain medication and muscle relaxer.  Consults: None  Significant Diagnostic Studies: none  Treatments: surgery: as above. Please see separately dictated operative report for further details  Discharge Exam: Blood pressure (!) 106/54, pulse 79, temperature 98.2 F (36.8 C), resp. rate 16, height 5\' 5"  (1.651 m), weight 88.5 kg, SpO2 94 %. A&O 5/5 throughout BLE   Disposition: Discharge disposition: 01-Home or Self Care       Discharge Instructions     Incentive spirometry RT   Complete by: As directed       Allergies as of 03/03/2023       Reactions   Albumin (human) Rash   Amoxicillin-pot Clavulanate Hives   Gabapentin Shortness Of Breath   Latex Dermatitis, Rash   Betadine [povidone Iodine] Dermatitis, Rash, Other (See Comments)   Skin burning Topical betadine and iodine have this reaction with patient.  IV contrast is NOT a problem.  jkl   Sucralfate    Other reaction(s): Other (See Comments) Abdominal Pain   Amoxicillin    REACTION: rash   Chantix [varenicline Tartrate]    Heart racing   Erythromycin    REACTION: rash   Sucralfate Nausea And Vomiting   Heart racing Lightheaded        Medication List     STOP taking these medications     clopidogrel 75 MG tablet Commonly known as: PLAVIX       TAKE these medications    acetaminophen 650 MG CR tablet Commonly known as: TYLENOL Take 650 mg by mouth every 8 (eight) hours as needed for pain.   albuterol 108 (90 Base) MCG/ACT inhaler Commonly known as: VENTOLIN HFA Inhale 1-2 puffs into the lungs every 6 (six) hours as needed for wheezing or shortness of breath.   amLODipine 5 MG tablet Commonly known as: NORVASC Take 1 tablet by mouth each morning and one tablet in the evening (total daily dose of 10mg )   atorvastatin 20 MG tablet Commonly known as: LIPITOR TAKE 1 TABLET BY MOUTH ONCE A DAY   Breztri Aerosphere 160-9-4.8 MCG/ACT Aero Generic drug: Budeson-Glycopyrrol-Formoterol Inhale 2 puffs into the lungs in the morning and at bedtime.   CENTRUM ADULTS PO Take 1 tablet by mouth.   cetirizine 10 MG tablet Commonly known as: ZYRTEC Take 10 mg by mouth daily. Allergies.   cyanocobalamin 1000 MCG tablet Commonly known as: VITAMIN B12 Take 1,000 mcg by mouth daily.   cyclobenzaprine 10 MG tablet Commonly known as: FLEXERIL Take 1 tablet (10 mg total) by mouth 3 (three) times daily as needed for muscle spasms. Pt takes 1 tablet at night. What changed:  when to take this reasons to take this   EPINEPHrine 0.3 mg/0.3 mL Soaj injection Commonly known as: EPI-PEN Inject 1 mg into the skin as needed.   furosemide 20 MG tablet Commonly known as: LASIX Take 1  tablet (20 mg total) by mouth as needed for fluid.   losartan 100 MG tablet Commonly known as: COZAAR Take 1 tablet (100 mg total) by mouth daily.   montelukast 10 MG tablet Commonly known as: SINGULAIR TAKE ONE TABLET BY MOUTH AT BEDTIME   oxyCODONE 5 MG immediate release tablet Commonly known as: Oxy IR/ROXICODONE Take 1 tablet (5 mg total) by mouth every 4 (four) hours as needed for moderate pain ((score 4 to 6)).   oxymetazoline 0.05 % nasal spray Commonly known as: AFRIN Place 1  spray into both nostrils 2 (two) times daily as needed for congestion. Use for nose bleeds   pantoprazole 40 MG tablet Commonly known as: PROTONIX Take 1 tablet (40 mg total) by mouth 2 (two) times daily. What changed: when to take this   PEPCID PO Take 1 tablet by mouth at bedtime.   senna 8.6 MG Tabs tablet Commonly known as: SENOKOT Take 1 tablet (8.6 mg total) by mouth 2 (two) times daily.   Vitamin D 50 MCG (2000 UT) Caps Take 2,000 Units by mouth.        Follow-up Information     Drake Leach, PA-C Follow up on 03/16/2023.   Specialty: Neurosurgery Contact information: [address] [place] [address] [phone]                 Signed: Susanne Borders 03/03/2023, 7:14 AM

## 2023-03-03 NOTE — Progress Notes (Signed)
Physical Therapy Treatment Patient Details Name: Heather Beasley MRN: [id number] DOB: [birth year] Today's Date: 03/03/2023   History of Present Illness Patient is a 67 year old female s/p L3-4 XLIF, L3-S1 PSF for spondylolisthesis. History of multiple spine surgeries.    PT Comments    Pt was supine in bed upon arrival. She is A and O x 4 and eager to go home. PT session focused on assessment of pt with SPC versus RW. She demonstrated safe abilities to exit bed, stand to [facility] and ambulate ~ 200 ft. No physical assistance required or LOB during gait with SPC. Author feels pt does not need RW. She does have slight SOB during activity however sao2 > 94% on rm air. Pt will benefit from continued skilled PT at DC to maximize her safety and independence with all ADLs    Recommendations for follow up therapy are one component of a multi-disciplinary discharge planning process, led by the attending physician.  Recommendations may be updated based on patient status, additional functional criteria and insurance authorization.  Assistance Recommended at Discharge Set up Supervision/Assistance  Patient can return home with the following Assistance with cooking/housework;Help with stairs or ramp for entrance;Assist for transportation   Equipment Recommendations  None recommended by PT       Precautions / Restrictions Precautions Precautions: Fall;Back Precaution Booklet Issued: No Required Braces or Orthoses:  (no brace needed) Restrictions Weight Bearing Restrictions: No     Mobility  Bed Mobility Overal bed mobility: Modified Independent Bed Mobility: Supine to Sit, Rolling Rolling: Supervision Supine to sit: Supervision (flat bed height)  General bed mobility comments: pt demonstrated correct performance of log roll technique to exit bed    Transfers Overall transfer level: Modified independent Equipment used: Straight cane Transfers: Sit to/from Stand Sit to Stand: Supervision        Ambulation/Gait Ambulation/Gait assistance: Supervision Gait Distance (Feet): 200 Feet Assistive device: Rolling walker (2 wheels) Gait Pattern/deviations: Step-through pattern Gait velocity: WNL     General Gait Details: pt demonstrated safe abilities to ambulate with SPC without LOB or safety concern. Author does not feel pt will need RW. she is safe with SPC   Stairs Stairs:  (pt has ramp entry to home. did educate pt on stairs for if sh comes across them out in public)        Balance Overall balance assessment: Needs assistance Sitting-balance support: Feet supported Sitting balance-Leahy Scale: Good     Standing balance support: Bilateral upper extremity supported, During functional activity, Reliant on assistive device for balance Standing balance-Leahy Scale: Good         Cognition Arousal/Alertness: Awake/alert Behavior During Therapy: WFL for tasks assessed/performed Overall Cognitive Status: Within Functional Limits for tasks assessed      General Comments: Pt is A and O x 4           General Comments General comments (skin integrity, edema, etc.): Pt was slightly SOB but sao2 > 94% throughout on rm air      Pertinent Vitals/Pain Pain Assessment Pain Assessment: 0-10 Pain Score: 6  Pain Location: lower back/ ms soreness Pain Descriptors / Indicators: Discomfort Pain Intervention(s): Limited activity within patient's tolerance, Monitored during session, Premedicated before session, Repositioned     PT Goals (current goals can now be found in the care plan section) Acute Rehab PT Goals Patient Stated Goal: to return home Progress towards PT goals: Progressing toward goals    Frequency    7X/week  PT Plan Current plan remains appropriate       AM-PAC PT "6 Clicks" Mobility   Outcome Measure  Help needed turning from your back to your side while in a flat bed without using bedrails?: None Help needed moving from lying on your back  to sitting on the side of a flat bed without using bedrails?: None Help needed moving to and from a bed to a chair (including a wheelchair)?: None Help needed standing up from a chair using your arms (e.g., wheelchair or bedside chair)?: A Little Help needed to walk in hospital room?: A Little Help needed climbing 3-5 steps with a railing? : A Little 6 Click Score: 21    End of Session   Activity Tolerance: Patient tolerated treatment well Patient left: Other (comment) (pt was sitting EOB post session) Nurse Communication: Mobility status PT Visit Diagnosis: Other abnormalities of gait and mobility (R26.89);Difficulty in walking, not elsewhere classified (R26.2)     Time: 4540-9811 PT Time Calculation (min) (ACUTE ONLY): 9 min  Charges:  $Gait Training: 8-22 mins                     Jetta Lout PTA 03/03/23, 8:40 AM

## 2023-03-03 NOTE — Progress Notes (Addendum)
Progress Note  History: Heather Beasley is s/p L3-4 XLIF, L3-S1 PSF  POD2: doing well this morning POD1: Expected back pain  Physical Exam: Vitals:   03/02/23 2118 03/02/23 2332  BP: (!) 112/54 (!) 106/54  Pulse: 86 79  Resp:  16  Temp:  98.2 F (36.8 C)  SpO2: 95% 94%    AA Ox3 CNI  Strength:5/5 throughout BLE   Data:  Other tests/results: none  Assessment/Plan:  Heather Beasley is a 67 year old presenting with lumbar spondylolisthesis status post L3-4 XLIF and L3-S1 PSF  - mobilize - pain control - DVT prophylaxis - PTOT; established with [facility] preop.  Manning Charity PA-C Department of Neurosurgery

## 2023-03-03 NOTE — Discharge Instructions (Signed)
  Your surgeon has performed an operation on your lumbar spine (low back) to relieve pressure on one or more nerves. Many times, patients feel better immediately after surgery and can "overdo it." Even if you feel well, it is important that you follow these activity guidelines. If you do not let your back heal properly from the surgery, you can increase the chance of hardware complications and/or return of your symptoms. The following are instructions to help in your recovery once you have been discharged from the hospital.  Do not use NSAIDs for 3 months after surgery.   Activity    No bending, lifting, or twisting ("BLT"). Avoid lifting objects heavier than 10 pounds (gallon milk jug).  Where possible, avoid household activities that involve lifting, bending, pushing, or pulling such as laundry, vacuuming, grocery shopping, and childcare. Try to arrange for help from friends and family for these activities while your back heals.  Increase physical activity slowly as tolerated.  Taking short walks is encouraged, but avoid strenuous exercise. Do not jog, run, bicycle, lift weights, or participate in any other exercises unless specifically allowed by your doctor. Avoid prolonged sitting, including car rides.  Talk to your doctor before resuming sexual activity.  You should not drive until cleared by your doctor.  Until released by your doctor, you should not return to work or school.  You should rest at home and let your body heal.   You may shower three days after your surgery.  After showering, lightly dab your incision dry. Do not take a tub bath or go swimming for 3 weeks, or until approved by your doctor at your follow-up appointment.  If you smoke, we strongly recommend that you quit.  Smoking has been proven to interfere with normal healing in your back and will dramatically reduce the success rate of your surgery. Please contact [facility] (800-QUIT-NOW) and use the resources at  www.[facility].com for assistance in stopping smoking.  Surgical Incision   If you have a dressing on your incision, you may remove it three days after your surgery. Keep your incision area clean and dry.  Your incision was closed with Dermabond glue. The glue should begin to peel away within about a week.  Diet            You may return to your usual diet. Be sure to stay hydrated.  When to Contact Us  Although your surgery and recovery will likely be uneventful, you may have some residual numbness, aches, and pains in your back and/or legs. This is normal and should improve in the next few weeks.  However, should you experience any of the following, contact us immediately: New numbness or weakness Pain that is progressively getting worse, and is not relieved by your pain medications or rest Bleeding, redness, swelling, pain, or drainage from surgical incision Chills or flu-like symptoms Fever greater than 101.0 F (38.3 C) Problems with bowel or bladder functions Difficulty breathing or shortness of breath Warmth, tenderness, or swelling in your calf  Contact Information During office hours (Monday-Friday 9 am to 5 pm), please call your physician at [phone] and ask for Kendelyn Jean After hours and weekends, please call [phone] and speak with the neurosurgeon on call For a life-threatening emergency, call 911 

## 2023-03-04 ENCOUNTER — Telehealth: Payer: Self-pay | Admitting: Neurosurgery

## 2023-03-04 ENCOUNTER — Telehealth: Payer: Self-pay | Admitting: *Deleted

## 2023-03-04 ENCOUNTER — Other Ambulatory Visit: Payer: Self-pay | Admitting: Neurosurgery

## 2023-03-04 DIAGNOSIS — G609 Hereditary and idiopathic neuropathy, unspecified: Secondary | ICD-10-CM | POA: Diagnosis not present

## 2023-03-04 DIAGNOSIS — J45909 Unspecified asthma, uncomplicated: Secondary | ICD-10-CM | POA: Diagnosis not present

## 2023-03-04 DIAGNOSIS — Z981 Arthrodesis status: Secondary | ICD-10-CM | POA: Diagnosis not present

## 2023-03-04 DIAGNOSIS — E785 Hyperlipidemia, unspecified: Secondary | ICD-10-CM | POA: Diagnosis not present

## 2023-03-04 DIAGNOSIS — K219 Gastro-esophageal reflux disease without esophagitis: Secondary | ICD-10-CM | POA: Diagnosis not present

## 2023-03-04 DIAGNOSIS — K589 Irritable bowel syndrome without diarrhea: Secondary | ICD-10-CM | POA: Diagnosis not present

## 2023-03-04 DIAGNOSIS — I10 Essential (primary) hypertension: Secondary | ICD-10-CM | POA: Diagnosis not present

## 2023-03-04 DIAGNOSIS — Z4789 Encounter for other orthopedic aftercare: Secondary | ICD-10-CM | POA: Diagnosis not present

## 2023-03-04 DIAGNOSIS — F32A Depression, unspecified: Secondary | ICD-10-CM | POA: Diagnosis not present

## 2023-03-04 MED ORDER — CELECOXIB 100 MG PO CAPS: 100.0000 mg | ORAL_CAPSULE | Freq: Two times a day (BID) | ORAL | 2 refills | Status: DC

## 2023-03-04 NOTE — Transitions of Care (Post Inpatient/ED Visit) (Signed)
   03/04/2023  Name: LAKARA SEAVERS MRN: [id number] DOB: 1956-10-24  Today's TOC FU Call Status: Today's TOC FU Call Status:: Successful TOC FU Call Competed TOC FU Call Complete Date: 03/04/23  Attempted to reach the patient regarding the most recent Inpatient/ED visit.  Follow Up Plan: Additional outreach attempts will be made to reach the patient to complete the Transitions of Care (Post Inpatient/ED visit) call.   Gean Maidens BSN RN [facility] Care Management [phone]

## 2023-03-04 NOTE — Telephone Encounter (Signed)
I spoke with the patient and she stated that the tylenol has eased the pain some but that she was still having some pain.  Cristin can you let her know that Danielle sent in Celebrex to her pharmacy to try.

## 2023-03-04 NOTE — Telephone Encounter (Signed)
Elmira from [facility] called in today to report pt pain level in lower back 8/10. Pt states she has been taking the prescribed oxycodone 5mg  as directed with no relief. CB: [phone]

## 2023-03-04 NOTE — Telephone Encounter (Signed)
I spoke with Heather Beasley. Her pain at this time is still an 8. She reports that she took oxycodone 5mg  about an hour ago and she can't tell she took anything.  She is taking oxycodone 5mg  every 4 hours as prescribed She is taking cyclobenzaprine 10mg  three times a day She has not been taking any tylenol since she got home from the hospital.  For her pain, I have advised her to try taking the tylenol 650mg  that is on her medication list and call me back in 1-2 hours with an update.   She is also concerned because she hasn't had a bowel movement since coming home. She is taking senna and miralax. She denies stomach pains or cramping. She is passing gas. We discussed red flags that would warrant evaluation. I also advised her that if the senna and miralax do not help, she can pick up magnesium citrate.

## 2023-03-05 ENCOUNTER — Telehealth: Payer: Self-pay

## 2023-03-05 DIAGNOSIS — J45909 Unspecified asthma, uncomplicated: Secondary | ICD-10-CM | POA: Diagnosis not present

## 2023-03-05 DIAGNOSIS — F32A Depression, unspecified: Secondary | ICD-10-CM | POA: Diagnosis not present

## 2023-03-05 DIAGNOSIS — Z981 Arthrodesis status: Secondary | ICD-10-CM | POA: Diagnosis not present

## 2023-03-05 DIAGNOSIS — K589 Irritable bowel syndrome without diarrhea: Secondary | ICD-10-CM | POA: Diagnosis not present

## 2023-03-05 DIAGNOSIS — G609 Hereditary and idiopathic neuropathy, unspecified: Secondary | ICD-10-CM | POA: Diagnosis not present

## 2023-03-05 DIAGNOSIS — Z4789 Encounter for other orthopedic aftercare: Secondary | ICD-10-CM | POA: Diagnosis not present

## 2023-03-05 DIAGNOSIS — K219 Gastro-esophageal reflux disease without esophagitis: Secondary | ICD-10-CM | POA: Diagnosis not present

## 2023-03-05 DIAGNOSIS — E785 Hyperlipidemia, unspecified: Secondary | ICD-10-CM | POA: Diagnosis not present

## 2023-03-05 DIAGNOSIS — I10 Essential (primary) hypertension: Secondary | ICD-10-CM | POA: Diagnosis not present

## 2023-03-05 NOTE — Transitions of Care (Post Inpatient/ED Visit) (Signed)
03/05/2023  Name: Heather Beasley MRN: [id number] DOB: 05-Sep-1956  Today's TOC FU Call Status: Today's TOC FU Call Status:: Successful TOC FU Call Competed TOC FU Call Complete Date: 03/05/23  Transition Care Management Follow-up Telephone Call Date of Discharge: 03/03/23 Discharge Facility: [facility]) Type of Discharge: Inpatient Admission Primary Inpatient Discharge Diagnosis:: 'sp lumbar fusion" How have you been since you were released from the hospital?: Same (Pt reports she continues to have pain-current rating 6/10-took painmeds last about 3hrs ago. She is alternating pain meds. Appetite fair. She voices she has not had a BM since surgery-but passing gas. She took some Senna&Miralax today.) Any questions or concerns?: No  Items Reviewed: Did you receive and understand the discharge instructions provided?: Yes Medications obtained,verified, and reconciled?: Yes (Medications Reviewed) (has not picked up Celebrex ordered by surgeon yest to help with pain-encouraged her to have family go get med and try taking it for pain relief. Pt wondering about Plavix as d/c instructions say to stop it-She will contact prescribing provider to discuss) Any new allergies since your discharge?: No Dietary orders reviewed?: Yes Type of Diet Ordered:: low salt/heart healthy Do you have support at home?: Yes People in Home: spouse Name of Support/Comfort Primary Source: Dorinda Hill  Medications Reviewed Today: Medications Reviewed Today     Reviewed by Charlyn Minerva, RN (Registered Nurse) on 03/05/23 at 1447  Med List Status: <None>   Medication Order Taking? Sig Documenting Provider Last Dose Status Informant  acetaminophen (TYLENOL) 650 MG CR tablet [id number] Yes Take 650 mg by mouth every 8 (eight) hours as needed for pain. [provider] Taking Active Self  albuterol (VENTOLIN HFA) 108 (90 Base) MCG/ACT inhaler [id number] Yes Inhale 1-2 puffs into  the lungs every 6 (six) hours as needed for wheezing or shortness of breath. [provider] Taking Active Self  amLODipine (NORVASC) 5 MG tablet [id number] Yes Take 1 tablet by mouth each morning and one tablet in the evening (total daily dose of 10mg ) Louanne Skye Devoria Albe., NP Taking Active   atorvastatin (LIPITOR) 20 MG tablet [id number] Yes TAKE 1 TABLET BY MOUTH ONCE A DAY Dugal, Tabitha, FNP Taking Active   Budeson-Glycopyrrol-Formoterol (BREZTRI AEROSPHERE) 160-9-4.8 MCG/ACT AERO [id number] Yes Inhale 2 puffs into the lungs in the morning and at bedtime. Salena Saner, MD Taking Active   celecoxib (CELEBREX) 100 MG capsule [id number]  Take 1 capsule (100 mg total) by mouth 2 (two) times daily. Susanne Borders, PA  Active   cetirizine (ZYRTEC) 10 MG tablet [id number] Yes Take 10 mg by mouth daily. Allergies. [provider] Taking Active Self  Cholecalciferol (VITAMIN D) 50 MCG (2000 UT) CAPS [id number] Yes Take 2,000 Units by mouth. [provider] Taking Active   cyclobenzaprine (FLEXERIL) 10 MG tablet [id number] Yes Take 1 tablet (10 mg total) by mouth 3 (three) times daily as needed for muscle spasms. Pt takes 1 tablet at night. Susanne Borders, PA Taking Active   EPINEPHrine 0.3 mg/0.3 mL IJ SOAJ injection [id number] Yes Inject 1 mg into the skin as needed. [provider] Taking Active   Famotidine (PEPCID PO) [id number] Yes Take 1 tablet by mouth at bedtime. [provider] Taking Active   furosemide (LASIX) 20 MG tablet [id number] Yes Take 1 tablet (20 mg total) by mouth as needed for fluid. Gaston Islam., NP Taking Active   losartan (COZAAR) 100 MG tablet [id number] Yes Take 1 tablet (100 mg total) by mouth  daily. Mort Sawyers, FNP Taking Active   montelukast (SINGULAIR) 10 MG tablet [id number] Yes TAKE ONE TABLET BY MOUTH AT BEDTIME Mort Sawyers, FNP Taking Active   Multiple Vitamins-Minerals (CENTRUM ADULTS PO) [id number] Yes Take 1 tablet  by mouth. [provider] Taking Active Self  oxyCODONE (OXY IR/ROXICODONE) 5 MG immediate release tablet [id number] Yes Take 1 tablet (5 mg total) by mouth every 4 (four) hours as needed for moderate pain ((score 4 to 6)). Susanne Borders, PA Taking Active   oxymetazoline (AFRIN) 0.05 % nasal spray [id number] Yes Place 1 spray into both nostrils 2 (two) times daily as needed for congestion. Use for nose bleeds [provider] Taking Active Self  pantoprazole (PROTONIX) 40 MG tablet [id number] Yes Take 1 tablet (40 mg total) by mouth 2 (two) times daily.  Patient taking differently: Take 40 mg by mouth daily.   Lorre Munroe, NP Taking Active   polyethylene glycol (MIRALAX / GLYCOLAX) 17 g packet [id number] Yes Take 17 g by mouth daily as needed for mild constipation. [provider] Taking Active Self  senna (SENOKOT) 8.6 MG TABS tablet [id number] Yes Take 1 tablet (8.6 mg total) by mouth 2 (two) times daily. Susanne Borders, PA Taking Active   vitamin B-12 (CYANOCOBALAMIN) 1000 MCG tablet [id number] Yes Take 1,000 mcg by mouth daily. [provider] Taking Active   Med List Note Vernie Ammons, RN 11/16/22 1140): Medical clearance to stop plavix x 7 days to Dr Festus Barren. 11/11/22  11/16/22 approved by Dr Wyn Quaker LP.             Home Care and Equipment/Supplies: Were Home Health Services Ordered?: Yes Name of Home Health Agency:: [facility] Has Agency set up a time to come to your home?: Yes First Home Health Visit Date: 03/04/23 Any new equipment or medical supplies ordered?: No  Functional Questionnaire: Do you need assistance with bathing/showering or dressing?: Yes Do you need assistance with eating?: No Do you have difficulty maintaining continence: No Do you need assistance with getting out of bed/getting out of a chair/moving?: Yes Do you have difficulty managing or taking your medications?: No  Follow up appointments reviewed: PCP  Follow-up appointment confirmed?: Yes Date of PCP follow-up appointment?: 03/08/23 Follow-up Provider: Wyatt Mage [facility] Follow-up appointment confirmed?: Yes Date of Specialist follow-up appointment?: 03/16/23 Follow-Up Specialty Provider:: Jacquelyne Balint Do you need transportation to your follow-up appointment?: No (pt confirms family will be able to get her to appts) Do you understand care options if your condition(s) worsen?: Yes-patient verbalized understanding  SDOH Interventions Today    Flowsheet Row Most Recent Value  SDOH Interventions   Food Insecurity Interventions Intervention Not Indicated  Transportation Interventions Intervention Not Indicated      TOC Interventions Today    Flowsheet Row Most Recent Value  TOC Interventions   TOC Interventions Discussed/Reviewed TOC Interventions Discussed, Post discharge activity limitations per provider, S/S of infection, Post op wound/incision care      Interventions Today    Flowsheet Row Most Recent Value  General Interventions   General Interventions Discussed/Reviewed General Interventions Discussed, Doctor Visits  Doctor Visits Discussed/Reviewed Doctor Visits Discussed, PCP, Specialist  PCP/Specialist Visits Compliance with follow-up visit  Education Interventions   Education Provided Provided Education  Provided Verbal Education On Nutrition, When to see the doctor, Medication, Other  [pain mgmt, bowel regimen mgmt-pt has not tried magnesium citrate as recommended by surgeon office-states she did not like it when she took it  yrs ago-she will have family go pickup other OTC laxatives to help her have a BM]  Nutrition Interventions   Nutrition Discussed/Reviewed Nutrition Discussed, Adding fruits and vegetables, Decreasing salt, Increaing proteins, Fluid intake  [pt states she has not eaten any veggies since returning home-discussed importance of high fiber diet to help manage constipation]  Pharmacy  Interventions   Pharmacy Dicussed/Reviewed Pharmacy Topics Discussed, Medications and their functions  Safety Interventions   Safety Discussed/Reviewed Safety Discussed  [pt ambulating with use of cane, HHPT came yesterday and coming again today per pt report]        Antionette Fairy, RN,BSN,CCM [facility] Care Management Care Management Community Coordinator Direct Phone: [phone] Toll Free: [phone] Fax: [phone]

## 2023-03-08 ENCOUNTER — Ambulatory Visit (INDEPENDENT_AMBULATORY_CARE_PROVIDER_SITE_OTHER): Payer: Medicare PPO | Admitting: Family

## 2023-03-08 ENCOUNTER — Encounter: Payer: Self-pay | Admitting: Family

## 2023-03-08 VITALS — BP 136/88 | HR 100 | Temp 97.9°F | Ht 65.0 in | Wt 194.4 lb

## 2023-03-08 DIAGNOSIS — Z981 Arthrodesis status: Secondary | ICD-10-CM | POA: Diagnosis not present

## 2023-03-08 NOTE — Assessment & Plan Note (Signed)
Pt has scheduled appt with surgeon for post op f/u.  Incision healing nicely, removed dressing and then applied tegarderm and paper tape while out and about. Advised otherwise to keep open per surgeon instructions and recommended she apply dressing at night time to prevent from scratching. Can apply light hydrocortisone around hive like rash where tegaderm was, advised do not apply to or near incision site. Pt advised to:  Please monitor site for worsening signs/symptoms of infection to include: increasing redness, increasing tenderness, increase in size, and or pustulant drainage from site. If this is to occur please let me know immediately.

## 2023-03-08 NOTE — Progress Notes (Signed)
Established Patient Hospital follow up Visit Subjective:   Patient ID: Heather Beasley, female    DOB: 1956-05-28  Age: 67 y.o. MRN: [id number]  CC:  Chief Complaint  Patient presents with   Hospitalization Follow-up        HPI  Heather Beasley is a 67 y.o. female presenting on 03/08/2023 for  hospital follow up.    Hospital: [facility]  Admit: 03/01/23  Discharge: 5/8 Discharge dx: was in hospital for lumbar adjacent segment disease with spondylolisthesis /lumbar fusion  Discharge medications: oxycodone 5 mg Q4H prn, sennakot as well as flexeril. Also have sent in celebrex from the surgeon's office as increased pain post op.   She has f/u with surgeon office 5/21 at 0830   Acute concerns:  A bit more pale and increasing fatigue. No sob.  Currently pain is 7/10.  She is currently having PT come to her home three times a week. OT is supposed to start this week.  She is worried because at night time she scratches the incision site while she is asleep. PT did take off the dressing on her right side but the two on her   Prior to pre op was positive in her nares for MRSA and staph. Was given antbx for this.    Allergies  Allergen Reactions   Albumin (Human) Rash   Amoxicillin-Pot Clavulanate Hives   Gabapentin Shortness Of Breath   Latex Dermatitis and Rash   Betadine [Povidone Iodine] Dermatitis, Rash and Other (See Comments)    Skin burning Topical betadine and iodine have this reaction with patient.  IV contrast is NOT a problem.  jkl   Sucralfate     Other reaction(s): Other (See Comments) Abdominal Pain   Amoxicillin     REACTION: rash   Chantix [Varenicline Tartrate]     Heart racing   Erythromycin     REACTION: rash   Sucralfate Nausea And Vomiting    Heart racing Lightheaded    Tegaderm Ag Mesh [Silver] Hives    Mild    ----------------   Social history:  Relevant past medical, surgical, family and social history reviewed and updated as indicated.  Interim medical history since our last visit reviewed.  Allergies and medications reviewed and updated.  DATA REVIEWED: CHART IN EPIC    ROS: Negative unless specifically indicated above in HPI.    Current Outpatient Medications:    acetaminophen (TYLENOL) 650 MG CR tablet, Take 650 mg by mouth every 8 (eight) hours as needed for pain., Disp: , Rfl:    albuterol (VENTOLIN HFA) 108 (90 Base) MCG/ACT inhaler, Inhale 1-2 puffs into the lungs every 6 (six) hours as needed for wheezing or shortness of breath., Disp: , Rfl:    amLODipine (NORVASC) 5 MG tablet, Take 1 tablet by mouth each morning and one tablet in the evening (total daily dose of 10mg ), Disp: 180 tablet, Rfl: 3   atorvastatin (LIPITOR) 20 MG tablet, TAKE 1 TABLET BY MOUTH ONCE A DAY, Disp: 90 tablet, Rfl: 3   Budeson-Glycopyrrol-Formoterol (BREZTRI AEROSPHERE) 160-9-4.8 MCG/ACT AERO, Inhale 2 puffs into the lungs in the morning and at bedtime., Disp: 10.7 g, Rfl: 11   celecoxib (CELEBREX) 100 MG capsule, Take 1 capsule (100 mg total) by mouth 2 (two) times daily., Disp: 60 capsule, Rfl: 2   cetirizine (ZYRTEC) 10 MG tablet, Take 10 mg by mouth daily. Allergies., Disp: , Rfl:    Cholecalciferol (VITAMIN D) 50 MCG (2000 UT) CAPS, Take 2,000 Units by  mouth., Disp: , Rfl:    cyclobenzaprine (FLEXERIL) 10 MG tablet, Take 1 tablet (10 mg total) by mouth 3 (three) times daily as needed for muscle spasms. Pt takes 1 tablet at night., Disp: 90 tablet, Rfl: 0   EPINEPHrine 0.3 mg/0.3 mL IJ SOAJ injection, Inject 1 mg into the skin as needed., Disp: , Rfl:    Famotidine (PEPCID PO), Take 1 tablet by mouth at bedtime., Disp: , Rfl:    furosemide (LASIX) 20 MG tablet, Take 1 tablet (20 mg total) by mouth as needed for fluid., Disp: 30 tablet, Rfl: 0   losartan (COZAAR) 100 MG tablet, Take 1 tablet (100 mg total) by mouth daily., Disp: 90 tablet, Rfl: 0   montelukast (SINGULAIR) 10 MG tablet, TAKE ONE TABLET BY MOUTH AT BEDTIME, Disp: 90 tablet,  Rfl: 3   Multiple Vitamins-Minerals (CENTRUM ADULTS PO), Take 1 tablet by mouth., Disp: , Rfl:    oxyCODONE (OXY IR/ROXICODONE) 5 MG immediate release tablet, Take 1 tablet (5 mg total) by mouth every 4 (four) hours as needed for moderate pain ((score 4 to 6))., Disp: 30 tablet, Rfl: 0   oxymetazoline (AFRIN) 0.05 % nasal spray, Place 1 spray into both nostrils 2 (two) times daily as needed for congestion. Use for nose bleeds, Disp: , Rfl:    pantoprazole (PROTONIX) 40 MG tablet, Take 1 tablet (40 mg total) by mouth 2 (two) times daily. (Patient taking differently: Take 40 mg by mouth daily.), Disp: 60 tablet, Rfl: 11   polyethylene glycol (MIRALAX / GLYCOLAX) 17 g packet, Take 17 g by mouth daily as needed for mild constipation., Disp: , Rfl:    senna (SENOKOT) 8.6 MG TABS tablet, Take 1 tablet (8.6 mg total) by mouth 2 (two) times daily., Disp: 120 tablet, Rfl: 0   vitamin B-12 (CYANOCOBALAMIN) 1000 MCG tablet, Take 1,000 mcg by mouth daily., Disp: , Rfl:       Objective:    BP 136/88 (BP Location: Left Arm)   Pulse 100   Temp 97.9 F (36.6 C) (Temporal)   Ht 5\' 5"  (1.651 m)   Wt 194 lb 6.4 oz (88.2 kg)   SpO2 99%   BMI 32.35 kg/m   Wt Readings from Last 3 Encounters:  03/08/23 194 lb 6.4 oz (88.2 kg)  03/01/23 195 lb (88.5 kg)  02/25/23 195 lb (88.5 kg)    Physical Exam Cardiovascular:     Rate and Rhythm: Normal rate and regular rhythm.  Pulmonary:     Breath sounds: Normal breath sounds. No decreased air movement.  Musculoskeletal:     Right lower leg: No edema.     Left lower leg: No edema.  Skin:    Comments: Bil incisions on the lumbar spine. Dressing removed, no s/s infection to include your drainage, redness and or warmth to site. She does have redness and hives slight surrounding tegaderm dressing. Right lateral side lower abdomen hip area with healing incision no redness warmth and or discharge.   Urticaria slight with some excoriation at multiple points surrounding  edge of tegaderm   Dressing removed, replaced with tegaderm for transfer home and used paper tape as pt with latex allergy.       Assessment & Plan:  Status post lumbar spinal fusion Assessment & Plan: Pt has scheduled appt with surgeon for post op f/u.  Incision healing nicely, removed dressing and then applied tegarderm and paper tape while out and about. Advised otherwise to keep open per surgeon instructions and recommended  she apply dressing at night time to prevent from scratching. Can apply light hydrocortisone around hive like rash where tegaderm was, advised do not apply to or near incision site. Pt advised to:  Please monitor site for worsening signs/symptoms of infection to include: increasing redness, increasing tenderness, increase in size, and or pustulant drainage from site. If this is to occur please let me know immediately.        Return in about 3 months (around 06/08/2023) for f/u blood pressure.  Mort Sawyers, FNP

## 2023-03-09 DIAGNOSIS — E785 Hyperlipidemia, unspecified: Secondary | ICD-10-CM | POA: Diagnosis not present

## 2023-03-09 DIAGNOSIS — I10 Essential (primary) hypertension: Secondary | ICD-10-CM | POA: Diagnosis not present

## 2023-03-09 DIAGNOSIS — G609 Hereditary and idiopathic neuropathy, unspecified: Secondary | ICD-10-CM | POA: Diagnosis not present

## 2023-03-09 DIAGNOSIS — J45909 Unspecified asthma, uncomplicated: Secondary | ICD-10-CM | POA: Diagnosis not present

## 2023-03-09 DIAGNOSIS — K219 Gastro-esophageal reflux disease without esophagitis: Secondary | ICD-10-CM | POA: Diagnosis not present

## 2023-03-09 DIAGNOSIS — Z4789 Encounter for other orthopedic aftercare: Secondary | ICD-10-CM | POA: Diagnosis not present

## 2023-03-09 DIAGNOSIS — Z981 Arthrodesis status: Secondary | ICD-10-CM | POA: Diagnosis not present

## 2023-03-09 DIAGNOSIS — K589 Irritable bowel syndrome without diarrhea: Secondary | ICD-10-CM | POA: Diagnosis not present

## 2023-03-09 DIAGNOSIS — F32A Depression, unspecified: Secondary | ICD-10-CM | POA: Diagnosis not present

## 2023-03-10 ENCOUNTER — Telehealth: Payer: Self-pay

## 2023-03-10 NOTE — Patient Outreach (Signed)
  Care Coordination   Follow Up Visit Note   03/10/2023 Name: Heather Beasley MRN: [id number] DOB: Jan 28, 1956  Transition of Care-Follow Up Call      Outreach all to patient to follow up. Spoke with patient. She is pleased to voice that she is progressing and feeling better. She was "finally able to have a BM" which gave her some relief. She has been taking Senna and Dulcolax daily for bowel mgmt. She reports pain is decreasing. She is only having to take pain med 2x/day. She has still not needed to take Celebrex and has not picked it up yet. She continues to work with HHPT and making progress-states they were out to see her yesterday. She completed PCP appt and surgeon follow up appt on 03/16/23. Denies any other issues or concerns at this time.      Care Coordination Interventions:  Yes, provided   TOC Interventions Today    Flowsheet Row Most Recent Value  TOC Interventions   TOC Interventions Discussed/Reviewed TOC Interventions Reviewed, Post discharge activity limitations per provider, Post op wound/incision care, S/S of infection      Interventions Today    Flowsheet Row Most Recent Value  Education Interventions   Education Provided Provided Education  Provided Verbal Education On When to see the doctor, Nutrition, Medication, Other  [pain mgmt, bowel mgmt]  Nutrition Interventions   Nutrition Discussed/Reviewed Adding fruits and vegetables, Nutrition Reviewed  Pharmacy Interventions   Pharmacy Dicussed/Reviewed Pharmacy Topics Reviewed, Medications and their functions  Safety Interventions   Safety Discussed/Reviewed Safety Reviewed        Follow up plan: No further intervention required.   Encounter Outcome:  Pt. Visit Completed    Alessandra Grout [facility] Care Management Care Management Community Coordinator Direct Phone: [phone] Toll Free: [phone] Fax: [phone]

## 2023-03-10 NOTE — Progress Notes (Signed)
   REFERRING PHYSICIAN:  Mort Sawyers, Fnp [address] [place],  [place] [address]  DOS: 03/01/23  L3-4 XLIF and L3-S1 PSF   HISTORY OF PRESENT ILLNESS: Heather Beasley is approximately 2 weeks status post L3-4 XLIF and L3-S1 PSF . Was given flexeril and oxycodone on discharge from the hospital.   She called after surgery and celebrex was sent to her pharmacy. She has not started as the pharmacist told her not to take with PLAVIX. She restarted her PLAVIX yesterday.   She has expected LBP with intermittent burning in her anterior thighs. She notes sensitivity around the incisions. Has itching above incisions.   She needs a refill of oxycodone. She is good on the flexeril.    PHYSICAL EXAMINATION:  General: Patient is well developed, well nourished, calm, collected, and in no apparent distress.   NEUROLOGICAL:  General: In no acute distress.   Awake, alert, oriented to person, place, and time.  Pupils equal round and reactive to light.  Facial tone is symmetric.     Strength:            Side Iliopsoas Quads Hamstring PF DF EHL  R 5 5 5 5 5 5   L 5 5 5 5 5 5    Incisions are c/d/I  She has some patches of redness just under her bra strap and above the incisions.    ROS (Neurologic):  Negative except as noted above  IMAGING: Nothing new to review.   ASSESSMENT/PLAN:  DIALA FIRMIN is doing well s/p above surgery. Treatment options reviewed with patient and following plan made:   - I have advised the patient to lift up to 10 pounds until 6 weeks after surgery (follow up with Dr. Myer Haff).  - Reviewed wound care.  - No bending, twisting, or lifting.  - Can use itch cream on rash below bra strap. Do not put over incisions.  - Continue on current medications including prn oxycodone and flexeril. Refill of oxycodone sent to pharmacy. PMP is revewed and appropriate.  - Follow up as scheduled in 4 weeks and prn.   Advised to contact the office if any questions or  concerns arise.  Drake Leach PA-C Department of neurosurgery

## 2023-03-11 ENCOUNTER — Telehealth: Payer: Self-pay | Admitting: Neurosurgery

## 2023-03-11 DIAGNOSIS — E785 Hyperlipidemia, unspecified: Secondary | ICD-10-CM | POA: Diagnosis not present

## 2023-03-11 DIAGNOSIS — K589 Irritable bowel syndrome without diarrhea: Secondary | ICD-10-CM | POA: Diagnosis not present

## 2023-03-11 DIAGNOSIS — Z4789 Encounter for other orthopedic aftercare: Secondary | ICD-10-CM | POA: Diagnosis not present

## 2023-03-11 DIAGNOSIS — J45909 Unspecified asthma, uncomplicated: Secondary | ICD-10-CM | POA: Diagnosis not present

## 2023-03-11 DIAGNOSIS — G609 Hereditary and idiopathic neuropathy, unspecified: Secondary | ICD-10-CM | POA: Diagnosis not present

## 2023-03-11 DIAGNOSIS — F32A Depression, unspecified: Secondary | ICD-10-CM | POA: Diagnosis not present

## 2023-03-11 DIAGNOSIS — K219 Gastro-esophageal reflux disease without esophagitis: Secondary | ICD-10-CM | POA: Diagnosis not present

## 2023-03-11 DIAGNOSIS — Z981 Arthrodesis status: Secondary | ICD-10-CM | POA: Diagnosis not present

## 2023-03-11 DIAGNOSIS — I10 Essential (primary) hypertension: Secondary | ICD-10-CM | POA: Diagnosis not present

## 2023-03-11 NOTE — Telephone Encounter (Signed)
Will OT with [facility]  Heather Beasley is refusing the OT evaluation that was ordered  CB: [phone]

## 2023-03-11 NOTE — Telephone Encounter (Signed)
Noted  

## 2023-03-12 DIAGNOSIS — G609 Hereditary and idiopathic neuropathy, unspecified: Secondary | ICD-10-CM | POA: Diagnosis not present

## 2023-03-12 DIAGNOSIS — Z4789 Encounter for other orthopedic aftercare: Secondary | ICD-10-CM | POA: Diagnosis not present

## 2023-03-12 DIAGNOSIS — E785 Hyperlipidemia, unspecified: Secondary | ICD-10-CM | POA: Diagnosis not present

## 2023-03-12 DIAGNOSIS — F32A Depression, unspecified: Secondary | ICD-10-CM | POA: Diagnosis not present

## 2023-03-12 DIAGNOSIS — Z981 Arthrodesis status: Secondary | ICD-10-CM | POA: Diagnosis not present

## 2023-03-12 DIAGNOSIS — I10 Essential (primary) hypertension: Secondary | ICD-10-CM | POA: Diagnosis not present

## 2023-03-12 DIAGNOSIS — K589 Irritable bowel syndrome without diarrhea: Secondary | ICD-10-CM | POA: Diagnosis not present

## 2023-03-12 DIAGNOSIS — K219 Gastro-esophageal reflux disease without esophagitis: Secondary | ICD-10-CM | POA: Diagnosis not present

## 2023-03-12 DIAGNOSIS — J45909 Unspecified asthma, uncomplicated: Secondary | ICD-10-CM | POA: Diagnosis not present

## 2023-03-16 ENCOUNTER — Ambulatory Visit (INDEPENDENT_AMBULATORY_CARE_PROVIDER_SITE_OTHER): Payer: Medicare PPO | Admitting: Orthopedic Surgery

## 2023-03-16 ENCOUNTER — Encounter: Payer: Self-pay | Admitting: Orthopedic Surgery

## 2023-03-16 VITALS — BP 132/82 | Temp 97.7°F | Ht 65.0 in | Wt 194.0 lb

## 2023-03-16 DIAGNOSIS — Z981 Arthrodesis status: Secondary | ICD-10-CM

## 2023-03-16 DIAGNOSIS — M4316 Spondylolisthesis, lumbar region: Secondary | ICD-10-CM

## 2023-03-16 DIAGNOSIS — M5136 Other intervertebral disc degeneration, lumbar region: Secondary | ICD-10-CM

## 2023-03-16 MED ORDER — OXYCODONE HCL 5 MG PO TABS
5.0000 mg | ORAL_TABLET | Freq: Four times a day (QID) | ORAL | 0 refills | Status: DC | PRN
Start: 2023-03-16 — End: 2023-03-31

## 2023-03-17 DIAGNOSIS — K219 Gastro-esophageal reflux disease without esophagitis: Secondary | ICD-10-CM | POA: Diagnosis not present

## 2023-03-17 DIAGNOSIS — G609 Hereditary and idiopathic neuropathy, unspecified: Secondary | ICD-10-CM | POA: Diagnosis not present

## 2023-03-17 DIAGNOSIS — F32A Depression, unspecified: Secondary | ICD-10-CM | POA: Diagnosis not present

## 2023-03-17 DIAGNOSIS — Z4789 Encounter for other orthopedic aftercare: Secondary | ICD-10-CM | POA: Diagnosis not present

## 2023-03-17 DIAGNOSIS — J45909 Unspecified asthma, uncomplicated: Secondary | ICD-10-CM | POA: Diagnosis not present

## 2023-03-17 DIAGNOSIS — E785 Hyperlipidemia, unspecified: Secondary | ICD-10-CM | POA: Diagnosis not present

## 2023-03-17 DIAGNOSIS — K589 Irritable bowel syndrome without diarrhea: Secondary | ICD-10-CM | POA: Diagnosis not present

## 2023-03-17 DIAGNOSIS — Z981 Arthrodesis status: Secondary | ICD-10-CM | POA: Diagnosis not present

## 2023-03-17 DIAGNOSIS — I10 Essential (primary) hypertension: Secondary | ICD-10-CM | POA: Diagnosis not present

## 2023-03-24 ENCOUNTER — Encounter: Payer: Self-pay | Admitting: Dermatology

## 2023-03-24 ENCOUNTER — Ambulatory Visit: Payer: Medicare PPO | Admitting: Dermatology

## 2023-03-24 ENCOUNTER — Other Ambulatory Visit: Payer: Self-pay | Admitting: Family

## 2023-03-24 VITALS — BP 123/68

## 2023-03-24 DIAGNOSIS — Z79899 Other long term (current) drug therapy: Secondary | ICD-10-CM

## 2023-03-24 DIAGNOSIS — D229 Melanocytic nevi, unspecified: Secondary | ICD-10-CM

## 2023-03-24 DIAGNOSIS — Z7189 Other specified counseling: Secondary | ICD-10-CM

## 2023-03-24 DIAGNOSIS — D1801 Hemangioma of skin and subcutaneous tissue: Secondary | ICD-10-CM | POA: Diagnosis not present

## 2023-03-24 DIAGNOSIS — L578 Other skin changes due to chronic exposure to nonionizing radiation: Secondary | ICD-10-CM

## 2023-03-24 DIAGNOSIS — Z1283 Encounter for screening for malignant neoplasm of skin: Secondary | ICD-10-CM

## 2023-03-24 DIAGNOSIS — Z85820 Personal history of malignant melanoma of skin: Secondary | ICD-10-CM

## 2023-03-24 DIAGNOSIS — I10 Essential (primary) hypertension: Secondary | ICD-10-CM

## 2023-03-24 DIAGNOSIS — L304 Erythema intertrigo: Secondary | ICD-10-CM | POA: Diagnosis not present

## 2023-03-24 DIAGNOSIS — D692 Other nonthrombocytopenic purpura: Secondary | ICD-10-CM | POA: Diagnosis not present

## 2023-03-24 DIAGNOSIS — W908XXA Exposure to other nonionizing radiation, initial encounter: Secondary | ICD-10-CM | POA: Diagnosis not present

## 2023-03-24 DIAGNOSIS — L814 Other melanin hyperpigmentation: Secondary | ICD-10-CM

## 2023-03-24 DIAGNOSIS — X32XXXA Exposure to sunlight, initial encounter: Secondary | ICD-10-CM

## 2023-03-24 DIAGNOSIS — L821 Other seborrheic keratosis: Secondary | ICD-10-CM

## 2023-03-24 MED ORDER — KETOCONAZOLE 2 % EX CREA: 1.0000 | TOPICAL_CREAM | Freq: Every day | CUTANEOUS | 11 refills | Status: DC

## 2023-03-24 MED ORDER — HYDROCORTISONE 2.5 % EX CREA: TOPICAL_CREAM | Freq: Every day | CUTANEOUS | 11 refills | Status: DC

## 2023-03-24 NOTE — Patient Instructions (Signed)
Due to recent changes in healthcare laws, you may see results of your pathology and/or laboratory studies on MyChart before the doctors have had a chance to review them. We understand that in some cases there may be results that are confusing or concerning to you. Please understand that not all results are received at the same time and often the doctors may need to interpret multiple results in order to provide you with the best plan of care or course of treatment. Therefore, we ask that you please give us 2 business days to thoroughly review all your results before contacting the office for clarification. Should we see a critical lab result, you will be contacted sooner.   If You Need Anything After Your Visit  If you have any questions or concerns for your doctor, please call our main line at [phone] and press option 4 to reach your doctor's medical assistant. If no one answers, please leave a voicemail as directed and we will return your call as soon as possible. Messages left after 4 pm will be answered the following business day.   You may also send us a message via MyChart. We typically respond to MyChart messages within 1-2 business days.  For prescription refills, please ask your pharmacy to contact our office. Our fax number is [phone].  If you have an urgent issue when the clinic is closed that cannot wait until the next business day, you can page your doctor at the number below.    Please note that while we do our best to be available for urgent issues outside of office hours, we are not available 24/7.   If you have an urgent issue and are unable to reach us, you may choose to seek medical care at your doctor's office, retail clinic, urgent care center, or emergency room.  If you have a medical emergency, please immediately call 911 or go to the emergency department.  Pager Numbers  - Dr. Kowalski: [phone]  - Dr. Moye: [phone]  - Dr. Stewart:  [phone]  In the event of inclement weather, please call our main line at [phone] for an update on the status of any delays or closures.  Dermatology Medication Tips: Please keep the boxes that topical medications come in in order to help keep track of the instructions about where and how to use these. Pharmacies typically print the medication instructions only on the boxes and not directly on the medication tubes.   If your medication is too expensive, please contact our office at [phone] option 4 or send us a message through MyChart.   We are unable to tell what your co-pay for medications will be in advance as this is different depending on your insurance coverage. However, we may be able to find a substitute medication at lower cost or fill out paperwork to get insurance to cover a needed medication.   If a prior authorization is required to get your medication covered by your insurance company, please allow us 1-2 business days to complete this process.  Drug prices often vary depending on where the prescription is filled and some pharmacies may offer cheaper prices.  The website www.goodrx.com contains coupons for medications through different pharmacies. The prices here do not account for what the cost may be with help from insurance (it may be cheaper with your insurance), but the website can give you the price if you did not use any insurance.  - You can print the associated coupon and take it with   your prescription to the pharmacy.  - You may also stop by our office during regular business hours and pick up a GoodRx coupon card.  - If you need your prescription sent electronically to a different pharmacy, notify our office through [facility] MyChart or by phone at [phone] option 4.     Si Usted Necesita Algo Despus de Su Visita  Tambin puede enviarnos un mensaje a travs de MyChart. Por lo general respondemos a los mensajes de MyChart en el transcurso de 1 a 2  das hbiles.  Para renovar recetas, por favor pida a su farmacia que se ponga en contacto con nuestra oficina. Nuestro nmero de fax es el [phone].  Si tiene un asunto urgente cuando la clnica est cerrada y que no puede esperar hasta el siguiente da hbil, puede llamar/localizar a su doctor(a) al nmero que aparece a continuacin.   Por favor, tenga en cuenta que aunque hacemos todo lo posible para estar disponibles para asuntos urgentes fuera del horario de oficina, no estamos disponibles las 24 horas del da, los 7 das de la semana.   Si tiene un problema urgente y no puede comunicarse con nosotros, puede optar por buscar atencin mdica  en el consultorio de su doctor(a), en una clnica privada, en un centro de atencin urgente o en una sala de emergencias.  Si tiene una emergencia mdica, por favor llame inmediatamente al 911 o vaya a la sala de emergencias.  Nmeros de bper  - Dr. Kowalski: [phone]  - Dra. Moye: [phone]  - Dra. Stewart: [phone]  En caso de inclemencias del tiempo, por favor llame a nuestra lnea principal al [phone] para una actualizacin sobre el estado de cualquier retraso o cierre.  Consejos para la medicacin en dermatologa: Por favor, guarde las cajas en las que vienen los medicamentos de uso tpico para ayudarle a seguir las instrucciones sobre dnde y cmo usarlos. Las farmacias generalmente imprimen las instrucciones del medicamento slo en las cajas y no directamente en los tubos del medicamento.   Si su medicamento es muy caro, por favor, pngase en contacto con nuestra oficina llamando al [phone] y presione la opcin 4 o envenos un mensaje a travs de MyChart.   No podemos decirle cul ser su copago por los medicamentos por adelantado ya que esto es diferente dependiendo de la cobertura de su seguro. Sin embargo, es posible que podamos encontrar un medicamento sustituto a menor costo o llenar un formulario para que el  seguro cubra el medicamento que se considera necesario.   Si se requiere una autorizacin previa para que su compaa de seguros cubra su medicamento, por favor permtanos de 1 a 2 das hbiles para completar este proceso.  Los precios de los medicamentos varan con frecuencia dependiendo del lugar de dnde se surte la receta y alguna farmacias pueden ofrecer precios ms baratos.  El sitio web www.goodrx.com tiene cupones para medicamentos de diferentes farmacias. Los precios aqu no tienen en cuenta lo que podra costar con la ayuda del seguro (puede ser ms barato con su seguro), pero el sitio web puede darle el precio si no utiliz ningn seguro.  - Puede imprimir el cupn correspondiente y llevarlo con su receta a la farmacia.  - Tambin puede pasar por nuestra oficina durante el horario de atencin regular y recoger una tarjeta de cupones de GoodRx.  - Si necesita que su receta se enve electrnicamente a una farmacia diferente, informe a nuestra oficina a travs de MyChart de [facility]   o por telfono llamando al [phone] y presione la opcin 4.  

## 2023-03-24 NOTE — Progress Notes (Signed)
   Follow-Up Visit   Subjective  Heather Beasley is a 67 y.o. female who presents for the following: Skin Cancer Screening and Full Body Skin Exam  The patient presents for Total-Body Skin Exam (TBSE) for skin cancer screening and mole check. The patient has spots, moles and lesions to be evaluated, some may be new or changing and the patient has concerns that these could be cancer.    The following portions of the chart were reviewed this encounter and updated as appropriate: medications, allergies, medical history  Review of Systems:  No other skin or systemic complaints except as noted in HPI or Assessment and Plan.  Objective  Well appearing patient in no apparent distress; mood and affect are within normal limits.  A full examination was performed including scalp, head, eyes, ears, nose, lips, neck, chest, axillae, abdomen, back, buttocks, bilateral upper extremities, bilateral lower extremities, hands, feet, fingers, toes, fingernails, and toenails. All findings within normal limits unless otherwise noted below.   Relevant physical exam findings are noted in the Assessment and Plan.    Assessment & Plan   HISTORY OF MELANOMA - above left knee ~1995 - No evidence of recurrence today - No lymphadenopathy - Recommend regular full body skin exams - Recommend daily broad spectrum sunscreen SPF 30+ to sun-exposed areas, reapply every 2 hours as needed.  - Call if any new or changing lesions are noted between office visits  LENTIGINES, SEBORRHEIC KERATOSES, HEMANGIOMAS - Benign normal skin lesions - Benign-appearing - Call for any changes  MELANOCYTIC NEVI - Tan-brown and/or pink-flesh-colored symmetric macules and papules - Benign appearing on exam today - Observation - Call clinic for new or changing moles - Recommend daily use of broad spectrum spf 30+ sunscreen to sun-exposed areas.   ACTINIC DAMAGE - Chronic condition, secondary to cumulative UV/sun exposure - diffuse  scaly erythematous macules with underlying dyspigmentation - Recommend daily broad spectrum sunscreen SPF 30+ to sun-exposed areas, reapply every 2 hours as needed.  - Staying in the shade or wearing long sleeves, sun glasses (UVA+UVB protection) and wide brim hats (4-inch brim around the entire circumference of the hat) are also recommended for sun protection.  - Call for new or changing lesions.  SKIN CANCER SCREENING PERFORMED TODAY.  INTERTRIGO Exam Pink patches of groin areas  Chronic and persistent condition with duration or expected duration over one year. Condition is symptomatic / bothersome to patient. Not to goal.   Intertrigo is a chronic recurrent rash that occurs in skin fold areas that may be associated with friction; heat; moisture; yeast; fungus; and bacteria.  It is exacerbated by increased movement / activity; sweating; and higher atmospheric temperature.  Treatment Plan Ketoconazole 2% cream qd Monday, Wednesday, Friday prn flares Hydrocortisone 2.5% cream qd Tuesday, Thursday, Saturday prn flares  Purpura - Chronic; persistent and recurrent.  Treatable, but not curable. - Violaceous macules and patches - Benign - Related to trauma, age, sun damage and/or use of blood thinners, chronic use of topical and/or oral steroids - Observe - Can use OTC arnica containing moisturizer such as Dermend Bruise Formula if desired - Call for worsening or other concerns   Return in about 1 year (around 03/23/2024) for TBSE History of Melanoma.  I, Joanie Coddington, CMA, am acting as scribe for Armida Sans, MD .   Documentation: I have reviewed the above documentation for accuracy and completeness, and I agree with the above.  Armida Sans, MD

## 2023-03-29 DIAGNOSIS — J301 Allergic rhinitis due to pollen: Secondary | ICD-10-CM | POA: Diagnosis not present

## 2023-03-31 ENCOUNTER — Encounter: Payer: Self-pay | Admitting: Dermatology

## 2023-03-31 ENCOUNTER — Other Ambulatory Visit: Payer: Self-pay | Admitting: Orthopedic Surgery

## 2023-03-31 DIAGNOSIS — M4316 Spondylolisthesis, lumbar region: Secondary | ICD-10-CM

## 2023-03-31 DIAGNOSIS — Z981 Arthrodesis status: Secondary | ICD-10-CM

## 2023-03-31 MED ORDER — OXYCODONE HCL 5 MG PO TABS
5.0000 mg | ORAL_TABLET | Freq: Three times a day (TID) | ORAL | 0 refills | Status: DC | PRN
Start: 2023-03-31 — End: 2023-05-20

## 2023-03-31 NOTE — Telephone Encounter (Signed)
DOS: 03/01/23  L3-4 XLIF and L3-S1 PSF   PMP reviewed and is appropriate. Refill for oxycodone okay, but will change directions to q 8 hours prn pain.   Please let her know.

## 2023-03-31 NOTE — Telephone Encounter (Signed)
Patient notified of refill.

## 2023-03-31 NOTE — Telephone Encounter (Signed)
From: Brenton Grills To: Office of Smiley Houseman, [place] Sent: 03/30/2023 5:24 PM EDT Subject: Medication Renewal Request  Refills have been requested for the following medications:   oxyCODONE (OXY IR/ROXICODONE) 5 MG immediate release tablet Heather Beasley M]  Preferred pharmacy: [facility] - [place] - [address] Delivery method: [employer]

## 2023-04-06 DIAGNOSIS — J301 Allergic rhinitis due to pollen: Secondary | ICD-10-CM | POA: Diagnosis not present

## 2023-04-07 ENCOUNTER — Encounter: Payer: Self-pay | Admitting: Neurosurgery

## 2023-04-12 ENCOUNTER — Other Ambulatory Visit: Payer: Self-pay

## 2023-04-12 DIAGNOSIS — M4316 Spondylolisthesis, lumbar region: Secondary | ICD-10-CM

## 2023-04-12 DIAGNOSIS — M5136 Other intervertebral disc degeneration, lumbar region: Secondary | ICD-10-CM

## 2023-04-12 DIAGNOSIS — J301 Allergic rhinitis due to pollen: Secondary | ICD-10-CM | POA: Diagnosis not present

## 2023-04-13 ENCOUNTER — Ambulatory Visit
Admission: RE | Admit: 2023-04-13 | Discharge: 2023-04-13 | Disposition: A | Discharge status: Home / Self Care | Payer: Medicare PPO | Source: Ambulatory Visit | Attending: Neurosurgery | Admitting: Neurosurgery

## 2023-04-13 ENCOUNTER — Ambulatory Visit
Admission: RE | Admit: 2023-04-13 | Discharge: 2023-04-13 | Disposition: A | Discharge status: Home / Self Care | Payer: Medicare PPO | Attending: Neurosurgery | Admitting: Neurosurgery

## 2023-04-13 ENCOUNTER — Encounter: Payer: Self-pay | Admitting: Neurosurgery

## 2023-04-13 ENCOUNTER — Ambulatory Visit (INDEPENDENT_AMBULATORY_CARE_PROVIDER_SITE_OTHER): Payer: Medicare PPO | Admitting: Neurosurgery

## 2023-04-13 VITALS — BP 130/86 | Temp 97.9°F | Ht 65.0 in | Wt 194.0 lb

## 2023-04-13 DIAGNOSIS — Z981 Arthrodesis status: Secondary | ICD-10-CM

## 2023-04-13 DIAGNOSIS — Z09 Encounter for follow-up examination after completed treatment for conditions other than malignant neoplasm: Secondary | ICD-10-CM

## 2023-04-13 DIAGNOSIS — M4316 Spondylolisthesis, lumbar region: Secondary | ICD-10-CM | POA: Diagnosis not present

## 2023-04-13 DIAGNOSIS — M48061 Spinal stenosis, lumbar region without neurogenic claudication: Secondary | ICD-10-CM | POA: Insufficient documentation

## 2023-04-13 DIAGNOSIS — M5136 Other intervertebral disc degeneration, lumbar region: Secondary | ICD-10-CM

## 2023-04-13 DIAGNOSIS — M47816 Spondylosis without myelopathy or radiculopathy, lumbar region: Secondary | ICD-10-CM | POA: Diagnosis not present

## 2023-04-13 NOTE — Progress Notes (Signed)
   REFERRING PHYSICIAN:  Mort Sawyers, Fnp [address] [place],  [place] [address]  DOS: 03/01/23  L3-4 XLIF and L3-S1 PSF   HISTORY OF PRESENT ILLNESS: Heather Beasley is status post L3-4 XLIF and L3-S1 PSF .   She is doing extremely well.  PHYSICAL EXAMINATION:  General: Patient is well developed, well nourished, calm, collected, and in no apparent distress.   NEUROLOGICAL:  General: In no acute distress.   Awake, alert, oriented to person, place, and time.  Pupils equal round and reactive to light.  Facial tone is symmetric.     Strength:            Side Iliopsoas Quads Hamstring PF DF EHL  R 5 5 5 5 5 5   L 5 5 5 5 5 5    Incisions are c/d/I   ROS (Neurologic):  Negative except as noted above  IMAGING: Nothing new to review.   ASSESSMENT/PLAN:  Heather Beasley is doing well s/p above surgery.  She would like to do some physical therapy.  I will send her for outpatient physical therapy to [facility] where she previously did therapy.  We reviewed her activity limitations.  We will see her back in clinic in approximately 6 weeks.    Venetia Night Department of neurosurgery

## 2023-04-14 ENCOUNTER — Ambulatory Visit
Admission: RE | Admit: 2023-04-14 | Discharge: 2023-04-14 | Disposition: A | Discharge status: Home / Self Care | Payer: Medicare PPO | Source: Ambulatory Visit | Attending: Family | Admitting: Family

## 2023-04-14 DIAGNOSIS — Z1231 Encounter for screening mammogram for malignant neoplasm of breast: Secondary | ICD-10-CM | POA: Diagnosis not present

## 2023-04-16 DIAGNOSIS — M4327 Fusion of spine, lumbosacral region: Secondary | ICD-10-CM | POA: Diagnosis not present

## 2023-04-16 DIAGNOSIS — M6281 Muscle weakness (generalized): Secondary | ICD-10-CM | POA: Diagnosis not present

## 2023-04-19 DIAGNOSIS — M4327 Fusion of spine, lumbosacral region: Secondary | ICD-10-CM | POA: Diagnosis not present

## 2023-04-19 DIAGNOSIS — J301 Allergic rhinitis due to pollen: Secondary | ICD-10-CM | POA: Diagnosis not present

## 2023-04-19 DIAGNOSIS — M6281 Muscle weakness (generalized): Secondary | ICD-10-CM | POA: Diagnosis not present

## 2023-04-22 DIAGNOSIS — M6281 Muscle weakness (generalized): Secondary | ICD-10-CM | POA: Diagnosis not present

## 2023-04-22 DIAGNOSIS — M4327 Fusion of spine, lumbosacral region: Secondary | ICD-10-CM | POA: Diagnosis not present

## 2023-04-26 DIAGNOSIS — J301 Allergic rhinitis due to pollen: Secondary | ICD-10-CM | POA: Diagnosis not present

## 2023-04-27 DIAGNOSIS — M4327 Fusion of spine, lumbosacral region: Secondary | ICD-10-CM | POA: Diagnosis not present

## 2023-04-27 DIAGNOSIS — M6281 Muscle weakness (generalized): Secondary | ICD-10-CM | POA: Diagnosis not present

## 2023-04-30 DIAGNOSIS — M6281 Muscle weakness (generalized): Secondary | ICD-10-CM | POA: Diagnosis not present

## 2023-04-30 DIAGNOSIS — M4327 Fusion of spine, lumbosacral region: Secondary | ICD-10-CM | POA: Diagnosis not present

## 2023-05-03 DIAGNOSIS — J301 Allergic rhinitis due to pollen: Secondary | ICD-10-CM | POA: Diagnosis not present

## 2023-05-04 DIAGNOSIS — M4327 Fusion of spine, lumbosacral region: Secondary | ICD-10-CM | POA: Diagnosis not present

## 2023-05-04 DIAGNOSIS — M6281 Muscle weakness (generalized): Secondary | ICD-10-CM | POA: Diagnosis not present

## 2023-05-05 ENCOUNTER — Encounter: Payer: Self-pay | Admitting: Pulmonary Disease

## 2023-05-05 ENCOUNTER — Telehealth: Payer: Self-pay

## 2023-05-05 ENCOUNTER — Ambulatory Visit: Payer: Medicare PPO | Admitting: Pulmonary Disease

## 2023-05-05 VITALS — BP 124/84 | HR 89 | Temp 97.7°F | Ht 65.0 in | Wt 191.8 lb

## 2023-05-05 DIAGNOSIS — Z7289 Other problems related to lifestyle: Secondary | ICD-10-CM | POA: Diagnosis not present

## 2023-05-05 DIAGNOSIS — R911 Solitary pulmonary nodule: Secondary | ICD-10-CM | POA: Diagnosis not present

## 2023-05-05 DIAGNOSIS — I422 Other hypertrophic cardiomyopathy: Secondary | ICD-10-CM

## 2023-05-05 DIAGNOSIS — J449 Chronic obstructive pulmonary disease, unspecified: Secondary | ICD-10-CM | POA: Diagnosis not present

## 2023-05-05 DIAGNOSIS — Z87891 Personal history of nicotine dependence: Secondary | ICD-10-CM | POA: Diagnosis not present

## 2023-05-05 MED ORDER — BREZTRI AEROSPHERE 160-9-4.8 MCG/ACT IN AERO: 2.0000 | INHALATION_SPRAY | Freq: Two times a day (BID) | RESPIRATORY_TRACT | 0 refills | Status: AC

## 2023-05-05 NOTE — Progress Notes (Signed)
Subjective:    Patient ID: Heather Beasley, female    DOB: [birth year], 67 y.o.   MRN: [id number]  Patient Care Team: Mort Sawyers, FNP as PCP - General (Family Medicine) Kathleene Hazel, MD as PCP - Cardiology (Cardiology) Carmina Miller, MD as Referring Physician (Radiation Oncology) Linus Salmons, MD (Otolaryngology) Venetia Night, MD as Consulting Physician (Neurosurgery) Georgiana Spinner, NP as Nurse Practitioner (Vascular Surgery)  Chief Complaint  Patient presents with   Follow-up    DOE. No wheezing or cough.     HPI Heather Beasley is a 67 year old former cigarette smoker (quit 2/24, 39 PY) with a history as noted below, who presents for follow-up on the issue of dyspnea and COPD.  Patient was last seen on 28 January 2023 at that time she was instructed to continue Marlow.  Since that visit she has not had any exacerbations.  She underwent lumbar spine surgery on 01 Mar 2023, did not have any respiratory issues postop.  She has baseline dyspnea on exertion.  No wheezing.  She has occasional dry cough.  No sputum production no hemoptysis.  She quit smoking on 19 December 2022 but now is vaping a non-nicotine substance.  She notes that Heather Beasley helps with her breathing significantly.  She did run out of Breztri 3 days ago and has not been able to get anymore until her check clears.  She has chronic 3 pillow orthopnea and this is of longstanding.  She has issues with hypertension and is followed by cardiology.     She had issues with hypersomnolence and underwent sleep study in July 2023.  This did not show any sleep disordered breathing i.e. no sleep apnea.  Since that time she has noted that her hypersomnolence has resolved.  It was likely related to medications given for back pain which she is now off of.  She has not had any chest pain.  No paroxysmal nocturnal dyspnea.  As noted she has chronic 3 pillow orthopnea.  This has not changed in character.  She has not had any calf  tenderness.  No lower extremity edema.  DATA 01/14/2022 CT chest lung cancer screening: Lung RADS 2, 8 mm groundglass nodule left lower lobe, emphysema, coronary calcifications noted 01/21/2022 echocardiogram: LVEF 70 to 75%, hyperdynamic LV, no wall motion abnormalities, moderate ASH and grade 1 DD.  Mild mitral valve regurgitation 01/26/2022 coronary CTA: Minimal coronary artery disease, PFO with left-to-right contrast flow 03/05/2022 PFTs: FEV1 1.60 L or 62% predicted, FVC 2.52 L or 75% predicted, FEV1/FVC 64%, no bronchodilator response, lung volumes normal.  Diffusion capacity moderately reduced, consistent with moderately severe obstructive airways disease, moderate diffusion impairment, probable concomitant restriction likely on the basis of obesity 05/01/2022 sleep study (in lab): No indication of sleep disordered breathing.  Consider other etiologies of somnolence. 01/15/2023 CT chest lung cancer screening: Lung RADS 2, 8 mm groundglass nodule on the left lower lobe unchanged, emphysema, coronary calcifications.  Review of Systems A 10 point review of systems was performed and it is as noted above otherwise negative.   Patient Active Problem List   Diagnosis Date Noted   Status post lumbar spinal fusion 03/08/2023   Lumbar adjacent segment disease with spondylolisthesis 03/01/2023   S/P lumbar fusion 03/01/2023   Lumbar spinal stenosis due to adjacent segment disease after fusion procedure (L4-S1 fusion, Left L3/4) 11/11/2022   Lumbar post-laminectomy syndrome 11/11/2022   Chronic pain syndrome 11/11/2022   Sacroiliac joint pain 11/11/2022   Lumbar radiculopathy 07/17/2022  History of left foot drop 07/17/2022   Vitamin B12 deficiency 04/14/2022   Snoring 01/20/2022   DOE (dyspnea on exertion) 01/08/2022   Elevated brain natriuretic peptide (BNP) level 01/08/2022   Pulmonary emphysema (HCC) 01/08/2022   Pulmonary nodule less than 6 mm determined by computed tomography of lung  01/08/2022   Other fatigue 01/08/2022   Family history of breast cancer in female 11/17/2021   Carotid stenosis, left 06/27/2020   Primary osteoarthritis involving multiple joints 03/01/2018   Osteopenia 02/14/2018   HLD (hyperlipidemia) 12/01/2016   Irritable bowel syndrome with diarrhea 12/01/2016   Cancer of nasal cavity and sinus (HCC) 07/07/2015   Essential hypertension, benign 08/08/2013   Mixed urge and stress incontinence 03/27/2013   Allergic rhinitis 12/13/2012   Chronic headaches 02/22/2012   GERD (gastroesophageal reflux disease) 02/22/2012   Vitamin D deficiency 01/04/2012    Social History   Tobacco Use   Smoking status: Former    Packs/day: 1.00    Years: 39.00    Additional pack years: 0.00    Total pack years: 39.00    Types: Cigarettes    Quit date: 12/19/2022    Years since quitting: 0.3   Smokeless tobacco: Never   Tobacco comments:    Using vape with no nicotine   Substance Use Topics   Alcohol use: Not Currently    Allergies  Allergen Reactions   Albumin (Human) Rash   Amoxicillin-Pot Clavulanate Hives   Gabapentin Shortness Of Breath   Latex Dermatitis and Rash   Betadine [Povidone Iodine] Dermatitis, Rash and Other (See Comments)    Skin burning Topical betadine and iodine have this reaction with patient.  IV contrast is NOT a problem.  jkl   Sucralfate     Other reaction(s): Other (See Comments) Abdominal Pain   Amoxicillin     REACTION: rash   Chantix [Varenicline Tartrate]     Heart racing   Erythromycin     REACTION: rash   Sucralfate Nausea And Vomiting    Heart racing Lightheaded    Tegaderm Ag Mesh [Silver] Hives    Mild     Current Meds  Medication Sig   acetaminophen (TYLENOL) 650 MG CR tablet Take 650 mg by mouth every 8 (eight) hours as needed for pain.   albuterol (VENTOLIN HFA) 108 (90 Base) MCG/ACT inhaler Inhale 1-2 puffs into the lungs every 6 (six) hours as needed for wheezing or shortness of breath.   amLODipine  (NORVASC) 5 MG tablet Take 1 tablet by mouth each morning and one tablet in the evening (total daily dose of 10mg )   atorvastatin (LIPITOR) 20 MG tablet TAKE 1 TABLET BY MOUTH ONCE A DAY   Budeson-Glycopyrrol-Formoterol (BREZTRI AEROSPHERE) 160-9-4.8 MCG/ACT AERO Inhale 2 puffs into the lungs in the morning and at bedtime.   cetirizine (ZYRTEC) 10 MG tablet Take 10 mg by mouth daily. Allergies.   Cholecalciferol (VITAMIN D) 50 MCG (2000 UT) CAPS Take 2,000 Units by mouth.   cyclobenzaprine (FLEXERIL) 10 MG tablet Take 1 tablet (10 mg total) by mouth 3 (three) times daily as needed for muscle spasms. Pt takes 1 tablet at night.   EPINEPHrine 0.3 mg/0.3 mL IJ SOAJ injection Inject 1 mg into the skin as needed.   Famotidine (PEPCID PO) Take 1 tablet by mouth at bedtime.   furosemide (LASIX) 20 MG tablet Take 1 tablet (20 mg total) by mouth as needed for fluid.   hydrocortisone 2.5 % cream Apply topically daily. Tuesday, Thursday, Saturday to  groin area prn flares   ketoconazole (NIZORAL) 2 % cream Apply 1 Application topically daily. Monday, Wednesday, Friday to groin area prn flares   losartan (COZAAR) 100 MG tablet TAKE ONE TABLET BY MOUTH ONCE A DAY   montelukast (SINGULAIR) 10 MG tablet TAKE ONE TABLET BY MOUTH AT BEDTIME   Multiple Vitamins-Minerals (CENTRUM ADULTS PO) Take 1 tablet by mouth.   oxymetazoline (AFRIN) 0.05 % nasal spray Place 1 spray into both nostrils 2 (two) times daily as needed for congestion. Use for nose bleeds   pantoprazole (PROTONIX) 40 MG tablet Take 1 tablet (40 mg total) by mouth 2 (two) times daily. (Patient taking differently: Take 40 mg by mouth daily.)   polyethylene glycol (MIRALAX / GLYCOLAX) 17 g packet Take 17 g by mouth daily as needed for mild constipation.    Immunization History  Administered Date(s) Administered   Influenza Split 07/27/2011, 08/25/2012   Influenza Whole 09/14/2007   Influenza,inj,Quad PF,6+ Mos 08/10/2013, 08/01/2015, 07/16/2016,  08/02/2018, 07/15/2019   Influenza,inj,quad, With Preservative 08/13/2017   Influenza-Unspecified 07/26/2021, 08/05/2022   Pneumococcal Conjugate-13 07/16/2016   Pneumococcal Polysaccharide-23 11/27/2011, 03/01/2018        Objective:     BP 124/84 (BP Location: Left Arm, Cuff Size: Normal)   Pulse 89   Temp 97.7 F (36.5 C)   Ht 5\' 5"  (1.651 m)   Wt 191 lb 12.8 oz (87 kg)   SpO2 96%   BMI 31.92 kg/m   SpO2: 96 % O2 Device: None (Room air)  GENERAL: Overweight woman, no acute distress, fully ambulatory.  No conversational dyspnea.  Nasal quality to the speech. HEAD: Normocephalic, atraumatic.  EYES: Pupils equal, round, reactive to light.  No scleral icterus.  MOUTH: Wears dentures upper and lower, class III airway.  No thrush. NECK: Supple. No thyromegaly. Trachea midline. No JVD.  No adenopathy. PULMONARY: Good air entry bilaterally.  Coarse otherwise, no adventitious sounds. CARDIOVASCULAR: S1 and S2. Regular rate and rhythm.  Grade 2/6 systolic ejection murmur which increases with Valsalva.  No gallop. ABDOMEN: Benign. MUSCULOSKELETAL: No joint deformity, no clubbing, no edema.  NEUROLOGIC: Chronic left foot drop.  This affects gait.  No other localizing finding. SKIN: Intact,warm,dry.  Nails are short (patient is a nail biter). PSYCH: Mood and behavior normal.        Assessment & Plan:     ICD-10-CM   1. Stage 2 moderate COPD by GOLD classification (HCC)  J44.9    Well compensated Provided samples of Breztri 2 puffs twice a day Continue as needed albuterol    2. Asymmetric septal hypertrophy (HCC)  I42.2    Issue adds complexity to her management Follows with cardiology    3. Nodule of lower lobe of left lung  R91.1    Subsolid, 8 mm Enrolled in lung cancer screening    4. Vapes non-nicotine containing substance  Z72.89    Curtail use as much as possible Uses perhaps 3 times a week    5. Former heavy cigarette smoker (20-39 per day)  Z87.891    No  evidence of relapse      Meds ordered this encounter  Medications   Budeson-Glycopyrrol-Formoterol (BREZTRI AEROSPHERE) 160-9-4.8 MCG/ACT AERO    Sig: Inhale 2 puffs into the lungs in the morning and at bedtime.    Dispense:  11.8 g    Refill:  0    Order Specific Question:   Lot Number?    Answer:   [id number]    Order Specific Question:  Expiration Date?    Answer:   07/26/2025    Order Specific Question:   Manufacturer?    Answer:   [employer] [[id number]]    Order Specific Question:   Quantity    Answer:   2   We provided the patient with forms for assistance from [employer] for Breztri.  Provided her with Breztri samples.  Will see the patient in follow-up in 4 months time she is to call sooner should any new problems arise.  Gailen Shelter, MD Advanced Bronchoscopy P[facility]    *This note was dictated using voice recognition software/Dragon.  Despite best efforts to proofread, errors can occur which can change the meaning. Any transcriptional errors that result from this process are unintentional and may not be fully corrected at the time of dictation.   Gailen Shelter, MD Advanced Bronchoscopy P[facility]    *This note was dictated using voice recognition software/Dragon.  Despite best efforts to proofread, errors can occur which can change the meaning. Any transcriptional errors that result from this process are unintentional and may not be fully corrected at the time of dictation.

## 2023-05-05 NOTE — Telephone Encounter (Signed)
Patient seen in the office today and was given [employer] assistance for to fill out and return. She will fill out the form and return it to the office.

## 2023-05-05 NOTE — Patient Instructions (Signed)
Continue taking your Breztri 2 puffs twice a day.  We provided you with samples today.  We will see you in follow-up in 4 months time call sooner should any new problems arise.

## 2023-05-06 ENCOUNTER — Ambulatory Visit: Payer: Medicare PPO | Admitting: Pulmonary Disease

## 2023-05-07 DIAGNOSIS — M6281 Muscle weakness (generalized): Secondary | ICD-10-CM | POA: Diagnosis not present

## 2023-05-07 DIAGNOSIS — M4327 Fusion of spine, lumbosacral region: Secondary | ICD-10-CM | POA: Diagnosis not present

## 2023-05-10 DIAGNOSIS — J301 Allergic rhinitis due to pollen: Secondary | ICD-10-CM | POA: Diagnosis not present

## 2023-05-10 DIAGNOSIS — M4327 Fusion of spine, lumbosacral region: Secondary | ICD-10-CM | POA: Diagnosis not present

## 2023-05-10 DIAGNOSIS — M6281 Muscle weakness (generalized): Secondary | ICD-10-CM | POA: Diagnosis not present

## 2023-05-10 NOTE — Telephone Encounter (Signed)
 Lm for update.  

## 2023-05-11 NOTE — Telephone Encounter (Signed)
Spoke to patient. She stated that she is in the process of completing form. She will bring form by once completed.

## 2023-05-11 NOTE — Telephone Encounter (Signed)
Patient dropped off completed form.  Form has been placed in Dr. Georgann Housekeeper folder for signature.

## 2023-05-17 ENCOUNTER — Other Ambulatory Visit: Payer: Self-pay | Admitting: Orthopedic Surgery

## 2023-05-17 DIAGNOSIS — Z981 Arthrodesis status: Secondary | ICD-10-CM

## 2023-05-17 DIAGNOSIS — M4316 Spondylolisthesis, lumbar region: Secondary | ICD-10-CM

## 2023-05-17 DIAGNOSIS — J301 Allergic rhinitis due to pollen: Secondary | ICD-10-CM | POA: Diagnosis not present

## 2023-05-17 NOTE — Progress Notes (Unsigned)
   REFERRING PHYSICIAN:  Mort Sawyers, Fnp [address] [place],  [place] [address]  DOS: 03/01/23  L3-4 XLIF and L3-S1 PSF   HISTORY OF PRESENT ILLNESS: LORELEI HEIKKILA is almost 3 months out from her surgery.   She was doing very well at her last visit.   She is doing great with no significant back or leg pain. She has some intermittent burning in left foot that is likely neuropathy. She continues on prn flexeril at night.    PHYSICAL EXAMINATION:  General: Patient is well developed, well nourished, calm, collected, and in no apparent distress.   NEUROLOGICAL:  General: In no acute distress.   Awake, alert, oriented to person, place, and time.  Pupils equal round and reactive to light.  Facial tone is symmetric.     Strength          Side Iliopsoas Quads Hamstring PF DF EHL  R 5 5 5 5 5 5   L 5 5 5 5 5 5    Incisions are well healed  ROS (Neurologic):  Negative except as noted above  IMAGING: Lumbar imaging dated 05/20/23:  No complications noted.  ASSESSMENT/PLAN:  JACQUELIN KRAJEWSKI is doing well s/p above surgery. Treatment options reviewed with patient and following plan made:   - She can return slowly to activity as tolerated. She will still avoid heavy lifting.  - Continue on prn flexeril q hs. No refills needed.  - Follow up with Dr. Myer Haff in 3 months. Will need xrays prior.   Advised to contact the office if any questions or concerns arise.  Drake Leach PA-C Department of neurosurgery

## 2023-05-17 NOTE — Telephone Encounter (Signed)
Forms faxed to [employer].

## 2023-05-18 ENCOUNTER — Ambulatory Visit (INDEPENDENT_AMBULATORY_CARE_PROVIDER_SITE_OTHER): Payer: Medicare PPO

## 2023-05-18 ENCOUNTER — Ambulatory Visit (INDEPENDENT_AMBULATORY_CARE_PROVIDER_SITE_OTHER): Payer: Medicare PPO | Admitting: Vascular Surgery

## 2023-05-18 DIAGNOSIS — M79604 Pain in right leg: Secondary | ICD-10-CM

## 2023-05-18 DIAGNOSIS — I6522 Occlusion and stenosis of left carotid artery: Secondary | ICD-10-CM

## 2023-05-18 DIAGNOSIS — M79605 Pain in left leg: Secondary | ICD-10-CM | POA: Diagnosis not present

## 2023-05-20 ENCOUNTER — Encounter: Payer: Self-pay | Admitting: Orthopedic Surgery

## 2023-05-20 ENCOUNTER — Ambulatory Visit
Admission: RE | Admit: 2023-05-20 | Discharge: 2023-05-20 | Disposition: A | Discharge status: Home / Self Care | Payer: Medicare PPO | Source: Ambulatory Visit | Attending: Orthopedic Surgery | Admitting: Orthopedic Surgery

## 2023-05-20 ENCOUNTER — Ambulatory Visit (INDEPENDENT_AMBULATORY_CARE_PROVIDER_SITE_OTHER): Payer: Medicare PPO | Admitting: Orthopedic Surgery

## 2023-05-20 ENCOUNTER — Ambulatory Visit
Admission: RE | Admit: 2023-05-20 | Discharge: 2023-05-20 | Disposition: A | Discharge status: Home / Self Care | Payer: Medicare PPO | Attending: Orthopedic Surgery | Admitting: Orthopedic Surgery

## 2023-05-20 VITALS — BP 124/78 | Temp 97.9°F | Ht 65.0 in | Wt 192.8 lb

## 2023-05-20 DIAGNOSIS — Z981 Arthrodesis status: Secondary | ICD-10-CM

## 2023-05-20 DIAGNOSIS — M4316 Spondylolisthesis, lumbar region: Secondary | ICD-10-CM | POA: Insufficient documentation

## 2023-05-20 DIAGNOSIS — Z4789 Encounter for other orthopedic aftercare: Secondary | ICD-10-CM | POA: Diagnosis not present

## 2023-05-20 DIAGNOSIS — M5136 Other intervertebral disc degeneration, lumbar region: Secondary | ICD-10-CM

## 2023-05-20 DIAGNOSIS — Z09 Encounter for follow-up examination after completed treatment for conditions other than malignant neoplasm: Secondary | ICD-10-CM

## 2023-05-28 ENCOUNTER — Encounter (INDEPENDENT_AMBULATORY_CARE_PROVIDER_SITE_OTHER): Payer: Self-pay | Admitting: Vascular Surgery

## 2023-05-28 ENCOUNTER — Ambulatory Visit (INDEPENDENT_AMBULATORY_CARE_PROVIDER_SITE_OTHER): Payer: Medicare PPO | Admitting: Vascular Surgery

## 2023-05-28 VITALS — BP 138/82 | HR 91 | Resp 18 | Ht 65.0 in | Wt 191.8 lb

## 2023-05-28 DIAGNOSIS — M79605 Pain in left leg: Secondary | ICD-10-CM | POA: Diagnosis not present

## 2023-05-28 DIAGNOSIS — I10 Essential (primary) hypertension: Secondary | ICD-10-CM

## 2023-05-28 DIAGNOSIS — I6522 Occlusion and stenosis of left carotid artery: Secondary | ICD-10-CM

## 2023-05-28 DIAGNOSIS — M79604 Pain in right leg: Secondary | ICD-10-CM

## 2023-05-28 DIAGNOSIS — E7800 Pure hypercholesterolemia, unspecified: Secondary | ICD-10-CM | POA: Diagnosis not present

## 2023-05-28 MED ORDER — CLOPIDOGREL BISULFATE 75 MG PO TABS: 75.0000 mg | ORAL_TABLET | Freq: Every day | ORAL | 3 refills | Status: DC

## 2023-05-28 NOTE — Assessment & Plan Note (Signed)
ABIs were normal.  Not likely a vascular cause of her lower extremity symptoms.

## 2023-05-28 NOTE — Progress Notes (Signed)
MRN : [id number]  Heather Beasley is a 67 y.o. (12-Dec-1955) female who presents with chief complaint of  Chief Complaint  Patient presents with   Follow-up    follow up with JD following  .  History of Present Illness: Patient returns today in follow up of carotid disease and leg pain.  She underwent studies recently demonstrating normal ABIs with multiphasic waveforms bilaterally and evaluation of leg pain.  Her leg pains actually some better.  She has had back surgery that seem to have helped. She is almost 3 years status post left carotid stent placement for high-grade stenosis.  She has done well from this.  She denies any focal neurologic symptoms. Specifically, the patient denies amaurosis fugax, speech or swallowing difficulties, or arm or leg weakness or numbness.  Carotid duplex demonstrates 1 to 39% right ICA stenosis with a patent left carotid stent with velocities in the 1 to 39% range.   Current Outpatient Medications  Medication Sig Dispense Refill   acetaminophen (TYLENOL) 650 MG CR tablet Take 650 mg by mouth every 8 (eight) hours as needed for pain.     albuterol (VENTOLIN HFA) 108 (90 Base) MCG/ACT inhaler Inhale 1-2 puffs into the lungs every 6 (six) hours as needed for wheezing or shortness of breath.     amLODipine (NORVASC) 5 MG tablet Take 1 tablet by mouth each morning and one tablet in the evening (total daily dose of 10mg ) 180 tablet 3   atorvastatin (LIPITOR) 20 MG tablet TAKE 1 TABLET BY MOUTH ONCE A DAY 90 tablet 3   Budeson-Glycopyrrol-Formoterol (BREZTRI AEROSPHERE) 160-9-4.8 MCG/ACT AERO Inhale 2 puffs into the lungs in the morning and at bedtime. 11.8 g 0   cetirizine (ZYRTEC) 10 MG tablet Take 10 mg by mouth daily. Allergies.     Cholecalciferol (VITAMIN D) 50 MCG (2000 UT) CAPS Take 2,000 Units by mouth.     clopidogrel (PLAVIX) 75 MG tablet Take 1 tablet (75 mg total) by mouth daily. 90 tablet 3   cyclobenzaprine (FLEXERIL) 10 MG tablet Take 1 tablet (10  mg total) by mouth 3 (three) times daily as needed for muscle spasms. Pt takes 1 tablet at night. 90 tablet 0   EPINEPHrine 0.3 mg/0.3 mL IJ SOAJ injection Inject 1 mg into the skin as needed.     Famotidine (PEPCID PO) Take 1 tablet by mouth at bedtime.     furosemide (LASIX) 20 MG tablet Take 1 tablet (20 mg total) by mouth as needed for fluid. 30 tablet 0   hydrocortisone 2.5 % cream Apply topically daily. Tuesday, Thursday, Saturday to groin area prn flares 30 g 11   ketoconazole (NIZORAL) 2 % cream Apply 1 Application topically daily. Monday, Wednesday, Friday to groin area prn flares 60 g 11   losartan (COZAAR) 100 MG tablet TAKE ONE TABLET BY MOUTH ONCE A DAY 90 tablet 3   montelukast (SINGULAIR) 10 MG tablet TAKE ONE TABLET BY MOUTH AT BEDTIME 90 tablet 3   Multiple Vitamins-Minerals (CENTRUM ADULTS PO) Take 1 tablet by mouth.     oxymetazoline (AFRIN) 0.05 % nasal spray Place 1 spray into both nostrils 2 (two) times daily as needed for congestion. Use for nose bleeds     pantoprazole (PROTONIX) 40 MG tablet Take 1 tablet (40 mg total) by mouth 2 (two) times daily. (Patient taking differently: Take 40 mg by mouth daily.) 60 tablet 11   No current facility-administered medications for this visit.    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; T1 N0 M0; s/p adjuvant XRT; did not require systemic chemotherapy   Cervical spondylosis    Colitis    Colon polyps    COPD (chronic obstructive pulmonary disease) (HCC)    Coronary artery disease    a.) cCTA  01/26/2022: Ca score 80.3 (82nd percentile for age/sex match control)   DDD (degenerative disc disease), cervical    a.) ACDF C3-C6   Depression    Diastolic dysfunction    a.) TTE 06/30/2013: EF 55-6%, mild LAE, mild MR, G1DD; b.) TTE 01/21/2022: EF 70-75%, mod LVH, mild MR, G1DD   Diverticulosis    GERD (gastroesophageal reflux disease)    Glaucoma    she reports that she does not have glacoma, she has thick corneas   Headaches, cluster    Heart murmur    HTN (hypertension)    Hyperlipidemia    IBS (irritable bowel syndrome)    Kidney stones    Long term current use of antithrombotics/antiplatelets    a.) clopidogrel   Lumbar adjacent segment disease with spondylolisthesis    Melanoma (HCC) 1995   above the L knee    Neuropathy    Neuropathy of left lower extremity    Nose colonized with MRSA 02/16/2023   a.) surgical PCR (+) 02/16/2023 prior to L3-4 LATERAL LUMBAR INTERBODY FUSION; L3-S1 POSTERIOR SPINAL FUSION   PFO (patent foramen ovale)    a.) cCTA 01/26/2022 --> PFO with L to R contrast flow   Pneumonia    PONV (postoperative nausea and vomiting)    2012 - during lumbar fusion she developed spinal fluid leak, and had to have 3 units on Blood, BP dropped, and in ICU,   Tobacco abuse     Past Surgical History:  Procedure Laterality Date   ABDOMINAL HYSTERECTOMY  1998   ANTERIOR LUMBAR FUSION N/A 03/01/2023   Procedure: L3-4 LATERAL LUMBAR INTERBODY FUSION;  Surgeon: Venetia Night, MD;  Location: [facility];  Service: Neurosurgery;  Laterality: N/A;   APPENDECTOMY  1998   APPLICATION OF INTRAOPERATIVE CT SCAN N/A 03/01/2023   Procedure: APPLICATION OF INTRAOPERATIVE CT SCAN;  Surgeon: Venetia Night, MD;  Location: [facility];  Service: Neurosurgery;  Laterality: N/A;   BACK SURGERY     CAROTID PTA/STENT INTERVENTION Left 06/27/2020   Procedure: CAROTID PTA/STENT INTERVENTION;  Surgeon: Annice Needy, MD;  Location: [facility];  Service: Cardiovascular;   Laterality: Left;   CATARACT EXTRACTION     CERVICAL FUSION  2010   C3-C6   CHOLECYSTECTOMY  08/2011   Procedure: Cholecystectomy; Location: [facility]; Surgeon: Renda Rolls, MD   COLONOSCOPY N/A    Procedure: COLONOSCOPY; Location: [facility]; Surgeon: Lynnae Prude, MD   CYSTOSCOPY N/A    Procedure: CYSTOSCOPY; Location: [facility]; Surgeon: Irineo Axon, MD   DILATION AND CURETTAGE OF UTERUS     ESOPHAGOGASTRODUODENOSCOPY  2012   Procedure: ESOPHAGOGASTRODUODENOSCOPY; Location: [facility]; Surgeon: Lynnae Prude, MD   FRACTURE SURGERY  2001   plate in right wrist and arm   IMAGE GUIDED SINUS SURGERY N/A  06/25/2015   Procedure: IMAGE GUIDED SINUS SURGERY;  Surgeon: Linus Salmons, MD;  Location: [facility];  Service: ENT;  Laterality: N/A;   LUMBAR SPINE SURGERY  07/2004   OOPHORECTOMY     POLYPECTOMY N/A 06/25/2015   Procedure: POLYPECTOMY NASAL;  Surgeon: Linus Salmons, MD;  Location: [facility];  Service: ENT;  Laterality: N/A;   TONSILLECTOMY  1968   VAGINAL DELIVERY     x2     Social History   Tobacco Use   Smoking status: Former    Current packs/day: 0.00    Average packs/day: 1 pack/day for 39.0 years (39.0 ttl pk-yrs)    Types: Cigarettes    Start date: 12/20/1983    Quit date: 12/19/2022    Years since quitting: 0.4   Smokeless tobacco: Never   Tobacco comments:    Using vape with no nicotine   Vaping Use   Vaping status: Every Day   Substances: Flavoring  Substance Use Topics   Alcohol use: Not Currently   Drug use: No       Family History  Problem Relation Age of Onset   Hypertension Mother    Hyperlipidemia Mother    Diabetes Mother    Colon cancer Father    Pancreatic cancer Father    Cancer Father        Colon & Pancreatic   Breast cancer Paternal Grandmother 36   Diabetes Paternal Grandmother    Coronary artery disease Paternal Grandmother    Heart failure Paternal Grandmother    Cancer Paternal Grandmother        breast   Stroke Brother      Allergies   Allergen Reactions   Albumin (Human) Rash   Amoxicillin-Pot Clavulanate Hives   Gabapentin Shortness Of Breath   Latex Dermatitis and Rash   Betadine [Povidone Iodine] Dermatitis, Rash and Other (See Comments)    Skin burning Topical betadine and iodine have this reaction with patient.  IV contrast is NOT a problem.  jkl   Sucralfate     Other reaction(s): Other (See Comments) Abdominal Pain   Amoxicillin     REACTION: rash   Chantix [Varenicline Tartrate]     Heart racing   Erythromycin     REACTION: rash   Sucralfate Nausea And Vomiting    Heart racing Lightheaded    Tegaderm Ag Mesh [Silver] Hives    Mild      REVIEW OF SYSTEMS (Negative unless checked)  Constitutional: [] Weight loss  [] Fever  [] Chills Cardiac: [] Chest pain   [] Chest pressure   [] Palpitations   [] Shortness of breath when laying flat   [] Shortness of breath at rest   [] Shortness of breath with exertion. Vascular:  [x] Pain in legs with walking   [x] Pain in legs at rest   [] Pain in legs when laying flat   [] Claudication   [] Pain in feet when walking  [] Pain in feet at rest  [] Pain in feet when laying flat   [] History of DVT   [] Phlebitis   [] Swelling in legs   [] Varicose veins   [] Non-healing ulcers Pulmonary:   [] Uses home oxygen   [] Productive cough   [] Hemoptysis   [] Wheeze  [x] COPD   [] Asthma Neurologic:  [] Dizziness  [] Blackouts   [] Seizures   [] History of stroke   [] History of TIA  [] Aphasia   [] Temporary blindness   [] Dysphagia   [] Weakness or numbness in arms   [] Weakness or numbness in legs Musculoskeletal:  [x] Arthritis   [] Joint swelling   [] Joint  pain   [] Low back pain Hematologic:  [] Easy bruising  [] Easy bleeding   [] Hypercoagulable state   [] Anemic   Gastrointestinal:  [] Blood in stool   [] Vomiting blood  [x] Gastroesophageal reflux/heartburn   [] Abdominal pain Genitourinary:  [] Chronic kidney disease   [] Difficult urination  [] Frequent urination  [] Burning with urination   [] Hematuria Skin:   [] Rashes   [] Ulcers   [] Wounds Psychological:  [] History of anxiety   []  History of major depression.  Physical Examination  BP 138/82 (BP Location: Right Arm)   Pulse 91   Resp 18   Ht 5\' 5"  (1.651 m)   Wt 191 lb 12.8 oz (87 kg)   BMI 31.92 kg/m  Gen:  WD/WN, NAD Head: [employer]/AT, No temporalis wasting. Ear/Nose/Throat: Hearing grossly intact, nares w/o erythema or drainage Eyes: Conjunctiva clear. Sclera non-icteric Neck: Supple.  Trachea midline Pulmonary:  Good air movement, no use of accessory muscles.  Cardiac: RRR, no JVD Vascular:  Vessel Right Left  Radial Palpable Palpable                          PT Palpable Palpable  DP Palpable Palpable   Gastrointestinal: soft, non-tender/non-distended. No guarding/reflex.  Musculoskeletal: M/S 5/5 throughout.  No deformity or atrophy. No edema. Neurologic: Sensation grossly intact in extremities.  Symmetrical.  Speech is fluent.  Psychiatric: Judgment intact, Mood & affect appropriate for pt's clinical situation. Dermatologic: No rashes or ulcers noted.  No cellulitis or open wounds.      Labs No results found for this or any previous visit (from the past 2160 hour(s)).  Radiology DG Lumbar Spine 2-3 Views  Result Date: 05/26/2023 CLINICAL DATA:  Postop fusion. EXAM: LUMBAR SPINE - 2-3 VIEW COMPARISON:  04/13/2023 FINDINGS: Patient has had posterior fusion from L3 through S1 with interbody fusion devices at the disc spaces. The hardware is intact. The appearance is stable. Normal alignment. No interval fracture. IMPRESSION: Stable appearance of the lumbar spine. Electronically Signed   By: Norva Pavlov M.D.   On: 05/26/2023 15:15   VAS [place] ABI WITH/WO TBI  Result Date: 05/19/2023  LOWER EXTREMITY DOPPLER STUDY Patient Name:  JOSEPHENE MARRONE  Date of Exam:   05/18/2023 Medical Rec #: [id number]        Accession #:    [id number] Date of Birth: 13-Sep-1956        Patient Gender: F Patient Age:   54 years Exam Location:   [facility] Procedure:      VAS [place] ABI WITH/WO TBI Referring Phys: Festus Barren --------------------------------------------------------------------------------  Indications: Rest pain.  Performing Technologist: Salvadore Farber RVT  Examination Guidelines: A complete evaluation includes at minimum, Doppler waveform signals and systolic blood pressure reading at the level of bilateral brachial, anterior tibial, and posterior tibial arteries, when vessel segments are accessible. Bilateral testing is considered an integral part of a complete examination. Photoelectric Plethysmograph (PPG) waveforms and toe systolic pressure readings are included as required and additional duplex testing as needed. Limited examinations for reoccurring indications may be performed as noted.  ABI Findings: +---------+------------------+-----+---------+--------+ Right    Rt Pressure (mmHg)IndexWaveform Comment  +---------+------------------+-----+---------+--------+ Brachial 136                                      +---------+------------------+-----+---------+--------+ ATA      160  1.16 triphasic         +---------+------------------+-----+---------+--------+ PTA      142               1.03 triphasic         +---------+------------------+-----+---------+--------+ Great Toe132               0.96 Normal            +---------+------------------+-----+---------+--------+ +---------+------------------+-----+---------+-------+ Left     Lt Pressure (mmHg)IndexWaveform Comment +---------+------------------+-----+---------+-------+ Brachial 138                                     +---------+------------------+-----+---------+-------+ ATA      147               1.07 triphasic        +---------+------------------+-----+---------+-------+ PTA      153               1.11 triphasic        +---------+------------------+-----+---------+-------+ Great Toe96                0.70  Abnormal         +---------+------------------+-----+---------+-------+  Summary: Right: Resting right ankle-brachial index is within normal range. The right toe-brachial index is normal. Left: Resting left ankle-brachial index is within normal range. The left toe-brachial index is normal. *See table(s) above for measurements and observations.  Electronically signed by Festus Barren MD on 05/19/2023 at 1:42:02 PM.    Final    VAS US CAROTID  Result Date: 05/19/2023 Carotid Arterial Duplex Study Patient Name:  CAITRIN PENDERGRAPH  Date of Exam:   05/18/2023 Medical Rec #: [id number]        Accession #:    [id number] Date of Birth: [birth year]        Patient Gender: F Patient Age:   57 years Exam Location:  [facility] Procedure:      VAS US CAROTID Referring Phys: Festus Barren --------------------------------------------------------------------------------  Indications:       Carotid artery disease and Left stent. Comparison Study:  04/2022 Performing Technologist: Salvadore Farber RVT  Examination Guidelines: A complete evaluation includes B-mode imaging, spectral Doppler, color Doppler, and power Doppler as needed of all accessible portions of each vessel. Bilateral testing is considered an integral part of a complete examination. Limited examinations for reoccurring indications may be performed as noted.  Right Carotid Findings: +----------+--------+--------+--------+-----------------------+--------+           PSV cm/sEDV cm/sStenosisPlaque Description     Comments +----------+--------+--------+--------+-----------------------+--------+ CCA Prox  58      12                                              +----------+--------+--------+--------+-----------------------+--------+ CCA Mid   75      16                                              +----------+--------+--------+--------+-----------------------+--------+ CCA Distal63      20                                               +----------+--------+--------+--------+-----------------------+--------+  ICA Prox  81      29      1-39%   heterogenous and smooth         +----------+--------+--------+--------+-----------------------+--------+ ICA Mid   56      22                                              +----------+--------+--------+--------+-----------------------+--------+ ICA Distal62      23                                              +----------+--------+--------+--------+-----------------------+--------+ ECA       74      13                                              +----------+--------+--------+--------+-----------------------+--------+ +----------+--------+-------+----------------+-------------------+           PSV cm/sEDV cmsDescribe        Arm Pressure (mmHG) +----------+--------+-------+----------------+-------------------+ Subclavian80             Multiphasic, WNL                    +----------+--------+-------+----------------+-------------------+ +---------+--------+--+--------+--+---------+ VertebralPSV cm/s40EDV cm/s13Antegrade +---------+--------+--+--------+--+---------+  Left Carotid Findings: +----------+--------+--------+--------+------------------+--------+           PSV cm/sEDV cm/sStenosisPlaque DescriptionComments +----------+--------+--------+--------+------------------+--------+ CCA Prox  52      17                                         +----------+--------+--------+--------+------------------+--------+ CCA Mid   60      17                                         +----------+--------+--------+--------+------------------+--------+ CCA Distal49      15                                         +----------+--------+--------+--------+------------------+--------+ ICA Prox  61      20                                stent    +----------+--------+--------+--------+------------------+--------+ ICA Mid   64      16                                 stent    +----------+--------+--------+--------+------------------+--------+ ICA Distal70      27                                         +----------+--------+--------+--------+------------------+--------+ ECA       98      20                                         +----------+--------+--------+--------+------------------+--------+ +----------+--------+--------+----------------+-------------------+  PSV cm/sEDV cm/sDescribe        Arm Pressure (mmHG) +----------+--------+--------+----------------+-------------------+ [id number]              Multiphasic, WNL                    +----------+--------+--------+----------------+-------------------+ +---------+--------+--+--------+--+---------+ VertebralPSV cm/s76EDV cm/s28Antegrade +---------+--------+--+--------+--+---------+   Summary: Right Carotid: Velocities in the right ICA are consistent with a 1-39% stenosis.                Non-hemodynamically significant plaque <50% noted in the CCA. The                ECA appears <50% stenosed. Left Carotid: Velocities in the left ICA are consistent with a 1-39% stenosis.               Non-hemodynamically significant plaque <50% noted in the CCA. The               ECA appears <50% stenosed. Widely patent ICA stent. Vertebrals:  Bilateral vertebral arteries demonstrate antegrade flow. Subclavians: Normal flow hemodynamics were seen in bilateral subclavian              arteries. *See table(s) above for measurements and observations.  Electronically signed by Festus Barren MD on 05/19/2023 at 1:41:28 PM.    Final     Assessment/Plan  Pain in limb ABIs were normal.  Not likely a vascular cause of her lower extremity symptoms.  Carotid stenosis, left Carotid duplex demonstrates 1 to 39% right ICA stenosis with a patent left carotid stent with velocities in the 1 to 39% range.  We discussed whether to stay on Plavix or come off.  She prefers to stay on it at this time.  Will  send in a new prescription for Plavix.  We will continue to follow this on an annual basis going forward.  Essential hypertension, benign blood pressure control important in reducing the progression of atherosclerotic disease. On appropriate oral medications.     HLD (hyperlipidemia) lipid control important in reducing the progression of atherosclerotic disease. Continue statin therapy  Festus Barren, MD  05/28/2023 12:22 PM    This note was created with Dragon medical transcription system.  Any errors from dictation are purely unintentional

## 2023-05-28 NOTE — Addendum Note (Signed)
Addended by: Annice Needy on: 05/28/2023 12:33 PM   Modules accepted: Level of Service

## 2023-05-28 NOTE — Assessment & Plan Note (Signed)
Carotid duplex demonstrates 1 to 39% right ICA stenosis with a patent left carotid stent with velocities in the 1 to 39% range.  We discussed whether to stay on Plavix or come off.  She prefers to stay on it at this time.  Will send in a new prescription for Plavix.  We will continue to follow this on an annual basis going forward.

## 2023-05-31 DIAGNOSIS — J301 Allergic rhinitis due to pollen: Secondary | ICD-10-CM | POA: Diagnosis not present

## 2023-06-07 DIAGNOSIS — J301 Allergic rhinitis due to pollen: Secondary | ICD-10-CM | POA: Diagnosis not present

## 2023-06-10 ENCOUNTER — Ambulatory Visit: Payer: Medicare PPO | Admitting: Cardiovascular Disease

## 2023-06-14 DIAGNOSIS — J301 Allergic rhinitis due to pollen: Secondary | ICD-10-CM | POA: Diagnosis not present

## 2023-06-15 ENCOUNTER — Encounter: Payer: Self-pay | Admitting: Family Medicine

## 2023-06-15 ENCOUNTER — Ambulatory Visit (INDEPENDENT_AMBULATORY_CARE_PROVIDER_SITE_OTHER): Payer: Medicare PPO | Admitting: Family Medicine

## 2023-06-15 VITALS — BP 118/80 | HR 90 | Temp 98.2°F | Ht 65.0 in | Wt 193.0 lb

## 2023-06-15 DIAGNOSIS — J0100 Acute maxillary sinusitis, unspecified: Secondary | ICD-10-CM

## 2023-06-15 DIAGNOSIS — Z760 Encounter for issue of repeat prescription: Secondary | ICD-10-CM

## 2023-06-15 DIAGNOSIS — J301 Allergic rhinitis due to pollen: Secondary | ICD-10-CM | POA: Diagnosis not present

## 2023-06-15 MED ORDER — ALBUTEROL SULFATE HFA 108 (90 BASE) MCG/ACT IN AERS: 1.0000 | INHALATION_SPRAY | Freq: Four times a day (QID) | RESPIRATORY_TRACT | 2 refills | Status: AC | PRN

## 2023-06-15 MED ORDER — DOXYCYCLINE HYCLATE 100 MG PO TABS: 100.0000 mg | ORAL_TABLET | Freq: Two times a day (BID) | ORAL | 0 refills | Status: DC

## 2023-06-15 MED ORDER — BENZONATATE 200 MG PO CAPS: 200.0000 mg | ORAL_CAPSULE | Freq: Three times a day (TID) | ORAL | 1 refills | Status: DC | PRN

## 2023-06-15 MED ORDER — PANTOPRAZOLE SODIUM 40 MG PO TBEC
40.0000 mg | DELAYED_RELEASE_TABLET | Freq: Every day | ORAL | Status: DC
Start: 2023-06-15 — End: 2024-03-16

## 2023-06-15 NOTE — Progress Notes (Unsigned)
Nighttime cough for the last ~10 days.  Taking mucinex at baseline.  Using tesslon prn at night.    duration of symptoms: ~10 days.   rhinorrhea: yes, anterior and posterior.   congestion: some, occ.   Frequently sneezing.   ear pain: L ear feels stuffy this AM.   sore throat: no cough: yes, no sputum.  myalgias: no No fevers.   Now with L maxillary pain.    Per HPI unless specifically indicated in ROS section   Meds, vitals, and allergies reviewed.   GEN: nad, alert and oriented HEENT: mucous membranes moist, TM w/o erythema, nasal epithelium injected, OP with cobblestoning, L max sinus ttp NECK: supple w/o LA CV: rrr. PULM: ctab, no inc wob ABD: soft, +bs EXT: no edema L max sinus ttp.

## 2023-06-15 NOTE — Patient Instructions (Addendum)
Start doxycycline and add on albuterol if needed for the cough.  Tessalon if needed.   Rest and fluids.  Take care.  Glad to see you.

## 2023-06-16 NOTE — Assessment & Plan Note (Signed)
Presumed.  Nontoxic.  Okay for outpatient follow-up. Start doxycycline and add on albuterol if needed for the cough.  Tessalon if needed.   Rest and fluids.  Update [place] as needed.

## 2023-07-05 ENCOUNTER — Other Ambulatory Visit: Payer: Self-pay | Admitting: Neurosurgery

## 2023-07-05 DIAGNOSIS — J301 Allergic rhinitis due to pollen: Secondary | ICD-10-CM | POA: Diagnosis not present

## 2023-07-07 DIAGNOSIS — J340 Abscess, furuncle and carbuncle of nose: Secondary | ICD-10-CM | POA: Diagnosis not present

## 2023-07-07 DIAGNOSIS — Z8522 Personal history of malignant neoplasm of nasal cavities, middle ear, and accessory sinuses: Secondary | ICD-10-CM | POA: Diagnosis not present

## 2023-07-07 DIAGNOSIS — J309 Allergic rhinitis, unspecified: Secondary | ICD-10-CM | POA: Diagnosis not present

## 2023-07-12 DIAGNOSIS — J301 Allergic rhinitis due to pollen: Secondary | ICD-10-CM | POA: Diagnosis not present

## 2023-07-13 ENCOUNTER — Other Ambulatory Visit: Payer: Self-pay | Admitting: Family

## 2023-07-13 DIAGNOSIS — E7800 Pure hypercholesterolemia, unspecified: Secondary | ICD-10-CM

## 2023-07-19 DIAGNOSIS — J301 Allergic rhinitis due to pollen: Secondary | ICD-10-CM | POA: Diagnosis not present

## 2023-07-26 DIAGNOSIS — J301 Allergic rhinitis due to pollen: Secondary | ICD-10-CM | POA: Diagnosis not present

## 2023-07-29 DIAGNOSIS — Z08 Encounter for follow-up examination after completed treatment for malignant neoplasm: Secondary | ICD-10-CM | POA: Diagnosis not present

## 2023-07-29 DIAGNOSIS — J340 Abscess, furuncle and carbuncle of nose: Secondary | ICD-10-CM | POA: Diagnosis not present

## 2023-07-29 DIAGNOSIS — Z8522 Personal history of malignant neoplasm of nasal cavities, middle ear, and accessory sinuses: Secondary | ICD-10-CM | POA: Diagnosis not present

## 2023-08-02 DIAGNOSIS — J301 Allergic rhinitis due to pollen: Secondary | ICD-10-CM | POA: Diagnosis not present

## 2023-08-02 NOTE — Progress Notes (Unsigned)
No chief complaint on file.  History of Present Illness: 67 yo female with history of carotid artery disease s/p carotid artery stenting, arthritis, depression, GERD, hyperlipidemia, HTN, IBS and prior tobacco abuse here today for cardiac follow up. I saw her as a new patient for the evaluation of shortness of breath and palpitations in March 2023.  She was seen in the ED on 12/31/21 with c/o dyspnea. Chest CTA with no PE but coronary artery calcifications noted. She is known to have carotid artery disease felt to be due to prior radiation therapy and has undergone carotid artery stenting at [facility] in 2021. Echo 01/21/22 with LVEF=70-75%. Moderate asymmetric septal hypertrophy. Mild mitral regurgitation. Coronary CTA 01/26/22 with mild CAD. *** She continues to smoke. Sleep study in July 2023 without evidence of sleep apnea.   She is here today for follow up. The patient denies any chest pain, dyspnea, palpitations, lower extremity edema, orthopnea, PND, dizziness, near syncope or syncope.   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; T1 N0 M0; s/p adjuvant XRT; did not require systemic chemotherapy   Cervical spondylosis    Colitis    Colon polyps    COPD (chronic obstructive pulmonary disease) (HCC)    Coronary artery disease    a.) cCTA 01/26/2022: Ca score 80.3 (82nd percentile for age/sex match control)   DDD (degenerative disc disease), cervical    a.) ACDF C3-C6   Depression    Diastolic dysfunction    a.) TTE  06/30/2013: EF 55-6%, mild LAE, mild MR, G1DD; b.) TTE 01/21/2022: EF 70-75%, mod LVH, mild MR, G1DD   Diverticulosis    GERD (gastroesophageal reflux disease)    Glaucoma    she reports that she does not have glacoma, she has thick corneas   Headaches, cluster    Heart murmur    HTN (hypertension)    Hyperlipidemia    IBS (irritable bowel syndrome)    Kidney stones    Long term current use of antithrombotics/antiplatelets    a.) clopidogrel   Lumbar adjacent segment disease with spondylolisthesis    Melanoma (HCC) 1995   above the L knee    Neuropathy    Neuropathy of left lower extremity    Nose colonized with MRSA 02/16/2023   a.) surgical PCR (+) 02/16/2023 prior to L3-4 LATERAL LUMBAR INTERBODY FUSION; L3-S1 POSTERIOR SPINAL FUSION   PFO (patent foramen ovale)    a.) cCTA 01/26/2022 --> PFO with L to R contrast flow   Pneumonia    PONV (postoperative nausea and vomiting)    2012 - during lumbar fusion she developed spinal fluid leak, and had to have 3 units on Blood, BP dropped, and in ICU,   Tobacco abuse     Past Surgical History:  Procedure Laterality Date   ABDOMINAL HYSTERECTOMY  1998   ANTERIOR LUMBAR FUSION N/A 03/01/2023   Procedure: L3-4 LATERAL LUMBAR INTERBODY FUSION;  Surgeon: Venetia Night, MD;  Location: [facility] OR[facility];  Service: Neurosurgery;  Laterality: N/A;   APPENDECTOMY  1998   APPLICATION OF INTRAOPERATIVE CT SCAN N/A 03/01/2023   Procedure: APPLICATION OF INTRAOPERATIVE CT SCAN;  Surgeon: Venetia Night, MD;  Location: [facility];  Service: Neurosurgery;  Laterality: N/A;   BACK SURGERY     CAROTID PTA/STENT INTERVENTION Left 06/27/2020   Procedure: CAROTID PTA/STENT INTERVENTION;  Surgeon: Annice Needy, MD;  Location: [facility];  Service: Cardiovascular;  Laterality: Left;   CATARACT EXTRACTION     CERVICAL FUSION  2010   C3-C6   CHOLECYSTECTOMY  08/2011   Procedure: Cholecystectomy; Location: [facility]; Surgeon: Renda Rolls, MD    COLONOSCOPY N/A    Procedure: COLONOSCOPY; Location: [facility]; Surgeon: Lynnae Prude, MD   CYSTOSCOPY N/A    Procedure: CYSTOSCOPY; Location: [facility]; Surgeon: Irineo Axon, MD   DILATION AND CURETTAGE OF UTERUS     ESOPHAGOGASTRODUODENOSCOPY  2012   Procedure: ESOPHAGOGASTRODUODENOSCOPY; Location: [facility]; Surgeon: Lynnae Prude, MD   FRACTURE SURGERY  2001   plate in right wrist and arm   IMAGE GUIDED SINUS SURGERY N/A 06/25/2015   Procedure: IMAGE GUIDED SINUS SURGERY;  Surgeon: Linus Salmons, MD;  Location: [facility];  Service: ENT;  Laterality: N/A;   LUMBAR SPINE SURGERY  07/2004   OOPHORECTOMY     POLYPECTOMY N/A 06/25/2015   Procedure: POLYPECTOMY NASAL;  Surgeon: Linus Salmons, MD;  Location: [facility];  Service: ENT;  Laterality: N/A;   TONSILLECTOMY  1968   VAGINAL DELIVERY     x2    Current Outpatient Medications  Medication Sig Dispense Refill   acetaminophen (TYLENOL) 650 MG CR tablet Take 650 mg by mouth every 8 (eight) hours as needed for pain.     albuterol (VENTOLIN HFA) 108 (90 Base) MCG/ACT inhaler Inhale 1-2 puffs into the lungs every 6 (six) hours as needed (for cough). 18 g 2   amLODipine (NORVASC) 5 MG tablet Take 1 tablet by mouth each morning and one tablet in the evening (total daily dose of 10mg ) 180 tablet 3   atorvastatin (LIPITOR) 20 MG tablet TAKE ONE TABLET BY MOUTH ONCE A DAY 90 tablet 3   benzonatate (TESSALON) 200 MG capsule Take 1 capsule (200 mg total) by mouth 3 (three) times daily as needed. 30 capsule 1   Budeson-Glycopyrrol-Formoterol (BREZTRI AEROSPHERE) 160-9-4.8 MCG/ACT AERO Inhale 2 puffs into the lungs in the morning and at bedtime. 11.8 g 0   cetirizine (ZYRTEC) 10 MG tablet Take 10 mg by mouth daily. Allergies.     Cholecalciferol (VITAMIN D) 50 MCG (2000 UT) CAPS Take 2,000 Units by mouth.     clopidogrel (PLAVIX) 75 MG tablet Take 1 tablet (75 mg total) by mouth daily. 90 tablet 3   cyclobenzaprine (FLEXERIL) 10 MG tablet Take 1 tablet (10  mg total) by mouth 3 (three) times daily as needed for muscle spasms. Pt takes 1 tablet at night. 90 tablet 0   doxycycline (VIBRA-TABS) 100 MG tablet Take 1 tablet (100 mg total) by mouth 2 (two) times daily. 14 tablet 0   EPINEPHrine 0.3 mg/0.3 mL IJ SOAJ injection Inject 1 mg into the skin as needed.     Famotidine (PEPCID PO) Take 1 tablet by mouth at bedtime.     furosemide (LASIX) 20 MG tablet Take 1 tablet (20 mg total) by mouth as needed for fluid. 30 tablet 0   hydrocortisone 2.5 % cream Apply topically daily. Tuesday, Thursday, Saturday to groin area prn flares 30 g 11   ketoconazole (NIZORAL) 2 % cream Apply 1 Application topically daily. Monday,  Wednesday, Friday to groin area prn flares 60 g 11   losartan (COZAAR) 100 MG tablet TAKE ONE TABLET BY MOUTH ONCE A DAY 90 tablet 3   montelukast (SINGULAIR) 10 MG tablet TAKE ONE TABLET BY MOUTH AT BEDTIME 90 tablet 3   Multiple Vitamins-Minerals (CENTRUM ADULTS PO) Take 1 tablet by mouth.     oxymetazoline (AFRIN) 0.05 % nasal spray Place 1 spray into both nostrils 2 (two) times daily as needed for congestion. Use for nose bleeds     pantoprazole (PROTONIX) 40 MG tablet Take 1 tablet (40 mg total) by mouth daily.     No current facility-administered medications for this visit.    Allergies  Allergen Reactions   Albumin (Human) Rash   Amoxicillin-Pot Clavulanate Hives   Gabapentin Shortness Of Breath   Latex Dermatitis and Rash   Betadine [Povidone Iodine] Dermatitis, Rash and Other (See Comments)    Skin burning Topical betadine and iodine have this reaction with patient.  IV contrast is NOT a problem.  jkl   Sucralfate     Other reaction(s): Other (See Comments) Abdominal Pain   Amoxicillin     REACTION: rash   Chantix [Varenicline Tartrate]     Heart racing   Erythromycin     REACTION: rash   Sucralfate Nausea And Vomiting    Heart racing Lightheaded    Tegaderm Ag Mesh [Silver] Hives    Mild     Social History    Socioeconomic History   Marital status: Married    Spouse name: Dorinda Hill   Number of children: 2   Years of education: Not on file   Highest education level: Not on file  Occupational History   Occupation: Conservation officer, nature   Occupation: disabled  Tobacco Use   Smoking status: Former    Current packs/day: 0.00    Average packs/day: 1 pack/day for 39.0 years (39.0 ttl pk-yrs)    Types: Cigarettes    Start date: 12/20/1983    Quit date: 12/19/2022    Years since quitting: 0.6   Smokeless tobacco: Never   Tobacco comments:    Using vape with no nicotine   Vaping Use   Vaping status: Every Day   Substances: Flavoring  Substance and Sexual Activity   Alcohol use: Not Currently   Drug use: No   Sexual activity: Not Currently    Partners: Male  Other Topics Concern   Not on file  Social History Narrative   Lives in [place] with husband. Dog in home. Work - disabled for neck and back pain.   Social Determinants of Health   Financial Resource Strain: Low Risk  (02/25/2023)   Overall Financial Resource Strain (CARDIA)    Difficulty of Paying Living Expenses: Not hard at all  Food Insecurity: No Food Insecurity (03/05/2023)   Hunger Vital Sign    Worried About Running Out of Food in the Last Year: Never true    Ran Out of Food in the Last Year: Never true  Transportation Needs: No Transportation Needs (03/05/2023)   PRAPARE - Administrator, Civil Service (Medical): No    Lack of Transportation (Non-Medical): No  Physical Activity: Inactive (02/25/2023)   Exercise Vital Sign    Days of Exercise per Week: 0 days    Minutes of Exercise per Session: 0 min  Stress: No Stress Concern Present (02/25/2023)   [employer] of Occupational Health - Occupational Stress Questionnaire    Feeling of Stress : Not at all  Social Connections: Moderately Isolated (02/25/2023)   Social Connection and Isolation Panel [NHANES]    Frequency of Communication with Friends and Family: Twice a week     Frequency of Social Gatherings with Friends and Family: Twice a week    Attends Religious Services: Never    Database administrator or Organizations: No    Attends Banker Meetings: Never    Marital Status: Married  Catering manager Violence: Not At Risk (02/25/2023)   Humiliation, Afraid, Rape, and Kick questionnaire    Fear of Current or Ex-Partner: No    Emotionally Abused: No    Physically Abused: No    Sexually Abused: No    Family History  Problem Relation Age of Onset   Hypertension Mother    Hyperlipidemia Mother    Diabetes Mother    Colon cancer Father    Pancreatic cancer Father    Cancer Father        Colon & Pancreatic   Breast cancer Paternal Grandmother 46   Diabetes Paternal Grandmother    Coronary artery disease Paternal Grandmother    Heart failure Paternal Grandmother    Cancer Paternal Grandmother        breast   Stroke Brother     Review of Systems:  As stated in the HPI and otherwise negative.   There were no vitals taken for this visit.  Physical Examination: General: Well developed, well nourished, NAD  HEENT: OP clear, mucus membranes moist  SKIN: warm, dry. No rashes. Neuro: No focal deficits  Musculoskeletal: Muscle strength 5/5 all ext  Psychiatric: Mood and affect normal  Neck: No JVD, no carotid bruits, no thyromegaly, no lymphadenopathy.  Lungs:Clear bilaterally, no wheezes, rhonci, crackles Cardiovascular: Regular rate and rhythm. No murmurs, gallops or rubs. Abdomen:Soft. Bowel sounds present. Non-tender.  Extremities: No lower extremity edema. Pulses are 2 + in the bilateral DP/PT.  EKG:  EKG is not *** ordered today. The ekg ordered today demonstrates   Echo 01/21/22:  1. Left ventricular ejection fraction, by estimation, is 70 to 75%. The  left ventricle has hyperdynamic function. The left ventricle has no  regional wall motion abnormalities. There is moderate asymmetric left  ventricular hypertrophy of the  septal  segment. Left ventricular diastolic parameters are consistent with Grade I  diastolic dysfunction (impaired relaxation).   2. Right ventricular systolic function is normal. The right ventricular  size is normal. Tricuspid regurgitation signal is inadequate for assessing  PA pressure.   3. The mitral valve is normal in structure. Mild mitral valve  regurgitation. No evidence of mitral stenosis.   4. The aortic valve is normal in structure. Aortic valve regurgitation is  not visualized. No aortic stenosis is present.   5. The inferior vena cava is normal in size with greater than 50%  respiratory variability, suggesting right atrial pressure of 3 mmHg.   Recent Labs: 11/24/2022: Pro B Natriuretic peptide (BNP) 242.0; TSH 2.70 01/12/2023: BUN 17; Creatinine, Ser 1.04; Potassium 5.1; Sodium 143 02/16/2023: Hemoglobin 13.0; Platelets 263   Lipid Panel    Component Value Date/Time   CHOL 138 11/17/2021 1143   TRIG 97.0 11/17/2021 1143   HDL 52.50 11/17/2021 1143   CHOLHDL 3 11/17/2021 1143   VLDL 19.4 11/17/2021 1143   LDLCALC 66 11/17/2021 1143     Wt Readings from Last 3 Encounters:  06/15/23 87.5 kg  05/28/23 87 kg  05/20/23 87.5 kg   Assessment and Plan:   1. CAD without angina:  Mild CAD on coronary CTA April 2023. Echo in 2023 with normal LV function and no significant valve disease. Her dyspnea is not felt to be cardiac related.  Will start ASA 81 mg daily. Continues statin.   2. Mitral regurgitation: Mild by echo in 2023. Repeat in 2026.    3. Dyspnea: *** benefit from lasix. No sleep apnea on sleep study  Labs/ tests ordered today include:   No orders of the defined types were placed in this encounter.   Disposition:   F/U with me in one year  Signed, Verne Carrow, MD 08/02/2023 2:30 PM    [facility] [address] [place], [place], [place]  [address] Phone: [phone]; Fax: [phone]

## 2023-08-03 ENCOUNTER — Ambulatory Visit: Payer: Medicare PPO | Attending: Cardiovascular Disease | Admitting: Cardiovascular Disease

## 2023-08-03 ENCOUNTER — Encounter: Payer: Self-pay | Admitting: Cardiovascular Disease

## 2023-08-03 VITALS — BP 120/92 | HR 89 | Ht 65.0 in | Wt 190.4 lb

## 2023-08-03 DIAGNOSIS — I340 Nonrheumatic mitral (valve) insufficiency: Secondary | ICD-10-CM

## 2023-08-03 DIAGNOSIS — I2510 Atherosclerotic heart disease of native coronary artery without angina pectoris: Secondary | ICD-10-CM | POA: Diagnosis not present

## 2023-08-03 NOTE — Patient Instructions (Signed)

## 2023-08-17 ENCOUNTER — Ambulatory Visit (INDEPENDENT_AMBULATORY_CARE_PROVIDER_SITE_OTHER): Payer: Medicare PPO | Admitting: Neurosurgery

## 2023-08-17 ENCOUNTER — Other Ambulatory Visit: Payer: Self-pay

## 2023-08-17 ENCOUNTER — Ambulatory Visit
Admission: RE | Admit: 2023-08-17 | Discharge: 2023-08-17 | Disposition: A | Discharge status: Home / Self Care | Payer: Medicare PPO | Attending: Neurosurgery | Admitting: Neurosurgery

## 2023-08-17 ENCOUNTER — Ambulatory Visit
Admission: RE | Admit: 2023-08-17 | Discharge: 2023-08-17 | Disposition: A | Discharge status: Home / Self Care | Payer: Medicare PPO | Source: Ambulatory Visit | Attending: Neurosurgery | Admitting: Neurosurgery

## 2023-08-17 ENCOUNTER — Encounter: Payer: Self-pay | Admitting: Neurosurgery

## 2023-08-17 VITALS — BP 136/86 | Ht 65.0 in | Wt 190.0 lb

## 2023-08-17 DIAGNOSIS — M4316 Spondylolisthesis, lumbar region: Secondary | ICD-10-CM

## 2023-08-17 DIAGNOSIS — M5412 Radiculopathy, cervical region: Secondary | ICD-10-CM | POA: Diagnosis not present

## 2023-08-17 DIAGNOSIS — M51369 Other intervertebral disc degeneration, lumbar region without mention of lumbar back pain or lower extremity pain: Secondary | ICD-10-CM | POA: Diagnosis not present

## 2023-08-17 DIAGNOSIS — Z981 Arthrodesis status: Secondary | ICD-10-CM | POA: Diagnosis not present

## 2023-08-17 MED ORDER — CYCLOBENZAPRINE HCL 10 MG PO TABS: 10.0000 mg | ORAL_TABLET | Freq: Three times a day (TID) | ORAL | 0 refills | Status: DC | PRN

## 2023-08-17 MED ORDER — METHYLPREDNISOLONE 4 MG PO TBPK: ORAL_TABLET | ORAL | 0 refills | Status: DC

## 2023-08-17 NOTE — Progress Notes (Signed)
   REFERRING PHYSICIAN:  Mort Sawyers, Fnp [address] [place],  [place] [address]  DOS: 03/01/23  L3-4 XLIF and L3-S1 PSF   HISTORY OF PRESENT ILLNESS: Heather Beasley is status post L3-4 XLIF and L3-S1 PSF .  She is doing very well from her back surgery.  Over the past 3 weeks, she has had pain in between her shoulder blades extending up her neck.  She also has discomfort down her right arm.  She has numbness in her right forearm as well as her for second and third fingers.  This is all new over the past 3 weeks.  She denies weakness or any other symptoms of myelopathy.  She is doing extremely well.  PHYSICAL EXAMINATION:  General: Patient is well developed, well nourished, calm, collected, and in no apparent distress.   NEUROLOGICAL:  General: In no acute distress.   Awake, alert, oriented to person, place, and time.  Pupils equal round and reactive to light.  Facial tone is symmetric.     Strength:            Side Iliopsoas Quads Hamstring PF DF EHL  R 5 5 5 5 5 5   L 5 5 5 5 5 5    Incisions are c/d/I   ROS (Neurologic):  Negative except as noted above  IMAGING: No complications noted on x-rays of her lumbar spine  ASSESSMENT/PLAN:  Heather Beasley is doing well s/p above surgery.    She has some new symptoms concerning for right sided cervical radiculopathy.  This is most consistent with a C6 distribution, but could be C7.  She has history of C3-6 anterior cervical discectomy and fusion many years ago.  She has no warning signs currently.  I will start her on physical therapy for her neck.  I will give her a Medrol Dosepak.  After she finishes this, she could use low-dose ibuprofen judiciously as she is on Plavix.  I will refill her muscle relaxants.  I would like to have a telephone visit with her in approximately 2 months.  If she is not improved at that time, we will proceed with cervical spine imaging.    I spent a total of 10 minutes in this patient's care  today. This time was spent reviewing pertinent records including imaging studies, obtaining and confirming history, performing a directed evaluation, formulating and discussing my recommendations, and documenting the visit within the medical record.    Venetia Night Department of neurosurgery

## 2023-08-23 DIAGNOSIS — J301 Allergic rhinitis due to pollen: Secondary | ICD-10-CM | POA: Diagnosis not present

## 2023-08-23 NOTE — Telephone Encounter (Signed)
Received a fax from [employer] saying the patient is conditionally approved for [facility] for 2025.  Nothing further needed.

## 2023-08-25 ENCOUNTER — Telehealth: Payer: Self-pay | Admitting: Family

## 2023-08-25 ENCOUNTER — Encounter: Payer: Self-pay | Admitting: Family

## 2023-08-25 ENCOUNTER — Telehealth: Payer: Medicare PPO | Admitting: Family

## 2023-08-25 ENCOUNTER — Telehealth: Payer: Self-pay

## 2023-08-25 VITALS — BP 120/86 | HR 80 | Temp 98.0°F | Ht 65.0 in | Wt 189.0 lb

## 2023-08-25 DIAGNOSIS — I340 Nonrheumatic mitral (valve) insufficiency: Secondary | ICD-10-CM | POA: Diagnosis not present

## 2023-08-25 DIAGNOSIS — R739 Hyperglycemia, unspecified: Secondary | ICD-10-CM

## 2023-08-25 DIAGNOSIS — I10 Essential (primary) hypertension: Secondary | ICD-10-CM

## 2023-08-25 DIAGNOSIS — E669 Obesity, unspecified: Secondary | ICD-10-CM | POA: Diagnosis not present

## 2023-08-25 DIAGNOSIS — G629 Polyneuropathy, unspecified: Secondary | ICD-10-CM | POA: Diagnosis not present

## 2023-08-25 DIAGNOSIS — I2510 Atherosclerotic heart disease of native coronary artery without angina pectoris: Secondary | ICD-10-CM | POA: Diagnosis not present

## 2023-08-25 DIAGNOSIS — E559 Vitamin D deficiency, unspecified: Secondary | ICD-10-CM | POA: Diagnosis not present

## 2023-08-25 DIAGNOSIS — R2232 Localized swelling, mass and lump, left upper limb: Secondary | ICD-10-CM | POA: Diagnosis not present

## 2023-08-25 DIAGNOSIS — E7800 Pure hypercholesterolemia, unspecified: Secondary | ICD-10-CM

## 2023-08-25 LAB — TSH: TSH: 2.76 u[IU]/mL (ref 0.35–5.50)

## 2023-08-25 LAB — VITAMIN D 25 HYDROXY (VIT D DEFICIENCY, FRACTURES): VITD: 43.63 ng/mL (ref 30.00–100.00)

## 2023-08-25 LAB — LIPID PANEL
Cholesterol: 125 mg/dL (ref 0–200)
HDL: 52.8 mg/dL (ref >39.00)
LDL Cholesterol: 54 mg/dL (ref 0–99)
NonHDL: 71.98
Total CHOL/HDL Ratio: 2
Triglycerides: 92 mg/dL (ref 0.0–149.0)
VLDL: 18.4 mg/dL (ref 0.0–40.0)

## 2023-08-25 LAB — VITAMIN B12: Vitamin B-12: >1537 pg/mL — ABNORMAL HIGH (ref 211–911)

## 2023-08-25 LAB — HEMOGLOBIN A1C: Hgb A1c MFr Bld: 6.1 % (ref 4.6–6.5)

## 2023-08-25 LAB — T4, FREE: Free T4: 0.66 ng/dL (ref 0.60–1.60)

## 2023-08-25 MED ORDER — WEGOVY 0.25 MG/0.5ML ~~LOC~~ SOAJ: SUBCUTANEOUS | 0 refills | Status: DC

## 2023-08-25 MED ORDER — WEGOVY 0.5 MG/0.5ML ~~LOC~~ SOAJ: SUBCUTANEOUS | 0 refills | Status: DC

## 2023-08-25 MED ORDER — WEGOVY 1 MG/0.5ML ~~LOC~~ SOAJ: 1.0000 mg | SUBCUTANEOUS | 0 refills | Status: DC

## 2023-08-25 NOTE — Telephone Encounter (Signed)
Called lvm for pt to call back. Need to collect another sample of blood or do a finger stick for A1c that was added after.

## 2023-08-25 NOTE — Assessment & Plan Note (Signed)
Continue amlodipine 5 mg and losartan 100 mg

## 2023-08-25 NOTE — Progress Notes (Deleted)
Established Patient Office Visit  Subjective:   Patient ID: Heather Beasley, female    DOB: 09/27/1956  Age: 67 y.o. MRN: [id number]  CC:  Chief Complaint  Patient presents with   Knot on left hand    HPI: Heather Beasley is a 67 y.o. female presenting on 08/25/2023 for Knot on left hand  Six months to one year ago with a 'knot' on her palmar aspect of her left hand. In the last few weeks has been causing her pain. She does have issues with opening a jar at times as she is left handed.   The bump is palpable on the left inner lower palmar aspect.  She states the lump has increased in size just a slight bit.  Currently on a steroid pack for her upper back.   Wt Readings from Last 3 Encounters:  08/25/23 189 lb (85.7 kg)  08/17/23 190 lb (86.2 kg)  08/03/23 190 lb 6.4 oz (86.4 kg)   Obesity: pt frustrated with her weight, she is limited in what she can do for exercises because of chronic back pain, COPD and heart diease, however she knows if she could lose weight she would feel overall better. She has tried various diets she states without success.   She is asking specifically about if she could try a GLP 1   ROS: Negative unless specifically indicated above in HPI.   Relevant past medical history reviewed and updated as indicated.   Allergies and medications reviewed and updated.   Current Outpatient Medications:    acetaminophen (TYLENOL) 650 MG CR tablet, Take 650 mg by mouth every 8 (eight) hours as needed for pain., Disp: , Rfl:    albuterol (VENTOLIN HFA) 108 (90 Base) MCG/ACT inhaler, Inhale 1-2 puffs into the lungs every 6 (six) hours as needed (for cough)., Disp: 18 g, Rfl: 2   atorvastatin (LIPITOR) 20 MG tablet, TAKE ONE TABLET BY MOUTH ONCE A DAY, Disp: 90 tablet, Rfl: 3   Budeson-Glycopyrrol-Formoterol (BREZTRI AEROSPHERE) 160-9-4.8 MCG/ACT AERO, Inhale 2 puffs into the lungs in the morning and at bedtime., Disp: 11.8 g, Rfl: 0   cetirizine (ZYRTEC) 10 MG  tablet, Take 10 mg by mouth daily. Allergies., Disp: , Rfl:    Cholecalciferol (VITAMIN D) 50 MCG (2000 UT) CAPS, Take 2,000 Units by mouth., Disp: , Rfl:    clopidogrel (PLAVIX) 75 MG tablet, Take 1 tablet (75 mg total) by mouth daily., Disp: 90 tablet, Rfl: 3   cyclobenzaprine (FLEXERIL) 10 MG tablet, Take 1 tablet (10 mg total) by mouth 3 (three) times daily as needed for muscle spasms., Disp: 90 tablet, Rfl: 0   EPINEPHrine 0.3 mg/0.3 mL IJ SOAJ injection, Inject 1 mg into the skin as needed., Disp: , Rfl:    Famotidine (PEPCID PO), Take 1 tablet by mouth at bedtime., Disp: , Rfl:    furosemide (LASIX) 20 MG tablet, Take 1 tablet (20 mg total) by mouth as needed for fluid., Disp: 30 tablet, Rfl: 0   losartan (COZAAR) 100 MG tablet, TAKE ONE TABLET BY MOUTH ONCE A DAY (Patient taking differently: Take 100 mg by mouth daily. Per patient taking 1/2 tablet (50 mg) daily), Disp: 90 tablet, Rfl: 3   methylPREDNISolone (MEDROL DOSEPAK) 4 MG TBPK tablet, Take as directed on box, Disp: 21 tablet, Rfl: 0   montelukast (SINGULAIR) 10 MG tablet, TAKE ONE TABLET BY MOUTH AT BEDTIME, Disp: 90 tablet, Rfl: 3   Multiple Vitamins-Minerals (CENTRUM ADULTS PO), Take 1 tablet by  mouth., Disp: , Rfl:    oxymetazoline (AFRIN) 0.05 % nasal spray, Place 1 spray into both nostrils 2 (two) times daily as needed for congestion. Use for nose bleeds, Disp: , Rfl:    pantoprazole (PROTONIX) 40 MG tablet, Take 1 tablet (40 mg total) by mouth daily., Disp: , Rfl:    Semaglutide-Weight Management (WEGOVY) 0.25 MG/0.5ML SOAJ, Start 0.25 mg qweek for four weeks, then increase to 0.5 mg dose qweek, Disp: 2 mL, Rfl: 0   [START ON 09/15/2023] Semaglutide-Weight Management (WEGOVY) 0.5 MG/0.5ML SOAJ, After four weeks of 0.25 mg qweek, increase to 0.5 mg qweek, Disp: 2 mL, Rfl: 0   [START ON 10/06/2023] Semaglutide-Weight Management (WEGOVY) 1 MG/0.5ML SOAJ, Inject 1 mg into the skin once a week., Disp: 2 mL, Rfl: 0  Allergies   Allergen Reactions   Albumin (Human) Rash   Amoxicillin-Pot Clavulanate Hives   Gabapentin Shortness Of Breath   Latex Dermatitis and Rash   Betadine [Povidone Iodine] Dermatitis, Rash and Other (See Comments)    Skin burning Topical betadine and iodine have this reaction with patient.  IV contrast is NOT a problem.  jkl   Sucralfate     Other reaction(s): Other (See Comments) Abdominal Pain   Amoxicillin     REACTION: rash   Chantix [Varenicline Tartrate]     Heart racing   Erythromycin     REACTION: rash   Sucralfate Nausea And Vomiting    Heart racing Lightheaded    Tegaderm Ag Mesh [Silver] Hives    Mild     Objective:   BP 120/86   Pulse 80   Temp 98 F (36.7 C)   Ht 5\' 5"  (1.651 m)   Wt 189 lb (85.7 kg)   SpO2 95%   BMI 31.45 kg/m    Physical Exam Vitals reviewed.  Skin:    Comments: Small 1.5 cm annular raised skin pigmented nodule lower to middle palmar aspect left hand     Assessment & Plan:  Nodule of left palm Assessment & Plan: Suspected ganglion cyst.  Referral placed for hand surgery for eval/treat due to being symptomatic  Tylenol prn, heat to site.   Orders: -     Ambulatory referral to Hand Surgery  Obesity (BMI 30-39.9) Assessment & Plan: Pt has CAD with HTN and hyperlipidemia.  FDA approval for CAD with wegovy, will attempt to start pt on this for management as well as weight improvement as pt obese which significantly affects these other risk facts. The beneficiary does not have any FDA labeled contraindications to the requested agent including pregnancy, lactation, h/o medullary thyroid cancer or multiple endocrine neoplasia type II.    Orders: -     TSH -     T4, free -     Amb Ref to Medical Weight Management -     GUYQIH; After four weeks of 0.25 mg qweek, increase to 0.5 mg qweek  Dispense: 2 mL; Refill: 0 -     Wegovy; Start 0.25 mg qweek for four weeks, then increase to 0.5 mg dose qweek  Dispense: 2 mL; Refill: 0 -      KVQQVZ; Inject 1 mg into the skin once a week.  Dispense: 2 mL; Refill: 0  Hyperglycemia Assessment & Plan: Ordering A1c pending results.   Orders: -     TSH -     T4, free -     Hemoglobin A1c; Future  Neuropathy Assessment & Plan: B12 ordered pending results.  Orders: -     Vitamin B12  Vitamin D deficiency -     VITAMIN D 25 Hydroxy (Vit-D Deficiency, Fractures)  Pure hypercholesterolemia Assessment & Plan: Continue atorvastatin 20 mg nightly. Ordered lipid panel, pending results. Work on low cholesterol diet and exercise as tolerated   Orders: -     Lipid panel -     [id number]; After four weeks of 0.25 mg qweek, increase to 0.5 mg qweek  Dispense: 2 mL; Refill: 0 -     Wegovy; Start 0.25 mg qweek for four weeks, then increase to 0.5 mg dose qweek  Dispense: 2 mL; Refill: 0 -     MWNUUV; Inject 1 mg into the skin once a week.  Dispense: 2 mL; Refill: 0  CVD (cardiovascular disease) -     OZDGUY; After four weeks of 0.25 mg qweek, increase to 0.5 mg qweek  Dispense: 2 mL; Refill: 0 -     Wegovy; Start 0.25 mg qweek for four weeks, then increase to 0.5 mg dose qweek  Dispense: 2 mL; Refill: 0 -     QIHKVQ; Inject 1 mg into the skin once a week.  Dispense: 2 mL; Refill: 0  Coronary artery disease involving native coronary artery of native heart without angina pectoris -     QVZDGL; After four weeks of 0.25 mg qweek, increase to 0.5 mg qweek  Dispense: 2 mL; Refill: 0 -     Wegovy; Start 0.25 mg qweek for four weeks, then increase to 0.5 mg dose qweek  Dispense: 2 mL; Refill: 0 -     OVFIEP; Inject 1 mg into the skin once a week.  Dispense: 2 mL; Refill: 0  Nonrheumatic mitral valve regurgitation -     PIRJJO; After four weeks of 0.25 mg qweek, increase to 0.5 mg qweek  Dispense: 2 mL; Refill: 0 -     Wegovy; Start 0.25 mg qweek for four weeks, then increase to 0.5 mg dose qweek  Dispense: 2 mL; Refill: 0 -     ACZYSA; Inject 1 mg into the skin once a week.  Dispense: 2  mL; Refill: 0  Essential hypertension, benign Assessment & Plan: Continue amlodipine 5 mg and losartan 100 mg     Orders: -     YTKZSW; After four weeks of 0.25 mg qweek, increase to 0.5 mg qweek  Dispense: 2 mL; Refill: 0 -     Wegovy; Start 0.25 mg qweek for four weeks, then increase to 0.5 mg dose qweek  Dispense: 2 mL; Refill: 0 -     FUXNAT; Inject 1 mg into the skin once a week.  Dispense: 2 mL; Refill: 0     Follow up plan: No follow-ups on file.  Mort Sawyers, FNP

## 2023-08-25 NOTE — Assessment & Plan Note (Signed)
Continue atorvastatin 20 mg nightly  Ordered lipid panel, pending results. Work on low cholesterol diet and exercise as tolerated

## 2023-08-25 NOTE — Assessment & Plan Note (Signed)
Suspected ganglion cyst.  Referral placed for hand surgery for eval/treat due to being symptomatic  Tylenol prn, heat to site.

## 2023-08-25 NOTE — Telephone Encounter (Signed)
Pharmacy Patient Advocate Encounter   Received notification from Patient Advice Request messages that prior authorization for [facility] 0.25MG /0.5ML auto-injectors is required/requested.   Insurance verification completed.   The patient is insured through [employer] .   Per test claim: PA required; PA submitted to above mentioned insurance via CoverMyMeds Key/confirmation #/EOC [id number] Status is pending

## 2023-08-25 NOTE — Assessment & Plan Note (Signed)
Will see if GLP 1 can be approved

## 2023-08-25 NOTE — Assessment & Plan Note (Addendum)
Pt has CAD with HTN and hyperlipidemia.  FDA approval for CAD with wegovy, will attempt to start pt on this for management as well as weight improvement as pt obese which significantly affects these other risk facts. The beneficiary does not have any FDA labeled contraindications to the requested agent including pregnancy, lactation, h/o medullary thyroid cancer or multiple endocrine neoplasia type II.

## 2023-08-25 NOTE — Assessment & Plan Note (Signed)
Ordering A1c pending results

## 2023-08-25 NOTE — Assessment & Plan Note (Signed)
B12 ordered pending results °

## 2023-08-26 ENCOUNTER — Other Ambulatory Visit (HOSPITAL_COMMUNITY): Payer: Self-pay

## 2023-08-26 ENCOUNTER — Telehealth: Payer: Self-pay

## 2023-08-26 NOTE — Telephone Encounter (Signed)
Authorization is good until 10/25/24.

## 2023-08-26 NOTE — Telephone Encounter (Signed)
Pharmacy Patient Advocate Encounter  Received notification from [facility] that Prior Authorization for Wegovy 0.25MG /0.5ML auto-injectors has been APPROVED from 01/01/23 to 10/25/24. Ran test claim, Copay is $260. This test claim was processed through [facility]- copay amounts may vary at other pharmacies due to pharmacy/plan contracts, or as the patient moves through the different stages of their insurance plan.   PA #/Case ID/Reference #: [id number]

## 2023-08-30 DIAGNOSIS — J301 Allergic rhinitis due to pollen: Secondary | ICD-10-CM | POA: Diagnosis not present

## 2023-08-30 NOTE — Telephone Encounter (Signed)
Can we make sure that patient received this message and if she is wanting to move forward with the medication?

## 2023-09-13 DIAGNOSIS — J301 Allergic rhinitis due to pollen: Secondary | ICD-10-CM | POA: Diagnosis not present

## 2023-09-13 NOTE — Addendum Note (Signed)
Addended by: Mort Sawyers on: 09/13/2023 09:51 AM   Modules accepted: Level of Service

## 2023-09-13 NOTE — Progress Notes (Addendum)
Virtual Visit via Video note  I connected with Heather Beasley on 09/13/23 at office by video and verified that I am speaking with the correct person using two identifiers.The provider, Mort Sawyers, FNP is located in their home at time of visit.  I discussed the limitations, risks, security and privacy concerns of performing an evaluation and management service by video and the availability of in person appointments. I also discussed with the patient that there may be a patient responsible charge related to this service. The patient expressed understanding and agreed to proceed.  Subjective: PCP: Mort Sawyers, FNP  Chief Complaint  Patient presents with   Knot on left hand    HPI  Six months to one year ago with a 'knot' on her palmar aspect of her left hand. In the last few weeks has been causing her pain. She does have issues with opening a jar at times as she is left handed.    The bump is palpable on the left inner lower palmar aspect.  She states the lump has increased in size just a slight bit.   Currently on a steroid pack for her upper back.       Wt Readings from Last 3 Encounters:  08/25/23 189 lb (85.7 kg)  08/17/23 190 lb (86.2 kg)  08/03/23 190 lb 6.4 oz (86.4 kg)    Obesity: pt frustrated with her weight, she is limited in what she can do for exercises because of chronic back pain, COPD and heart diease, however she knows if she could lose weight she would feel overall better. She has tried various diets she states without success.   She is asking specifically about if she could try a GLP 1  ROS: Per HPI  Current Outpatient Medications:    acetaminophen (TYLENOL) 650 MG CR tablet, Take 650 mg by mouth every 8 (eight) hours as needed for pain., Disp: , Rfl:    albuterol (VENTOLIN HFA) 108 (90 Base) MCG/ACT inhaler, Inhale 1-2 puffs into the lungs every 6 (six) hours as needed (for cough)., Disp: 18 g, Rfl: 2   atorvastatin (LIPITOR) 20 MG tablet, TAKE ONE  TABLET BY MOUTH ONCE A DAY, Disp: 90 tablet, Rfl: 3   Budeson-Glycopyrrol-Formoterol (BREZTRI AEROSPHERE) 160-9-4.8 MCG/ACT AERO, Inhale 2 puffs into the lungs in the morning and at bedtime., Disp: 11.8 g, Rfl: 0   cetirizine (ZYRTEC) 10 MG tablet, Take 10 mg by mouth daily. Allergies., Disp: , Rfl:    Cholecalciferol (VITAMIN D) 50 MCG (2000 UT) CAPS, Take 2,000 Units by mouth., Disp: , Rfl:    clopidogrel (PLAVIX) 75 MG tablet, Take 1 tablet (75 mg total) by mouth daily., Disp: 90 tablet, Rfl: 3   cyclobenzaprine (FLEXERIL) 10 MG tablet, Take 1 tablet (10 mg total) by mouth 3 (three) times daily as needed for muscle spasms., Disp: 90 tablet, Rfl: 0   EPINEPHrine 0.3 mg/0.3 mL IJ SOAJ injection, Inject 1 mg into the skin as needed., Disp: , Rfl:    Famotidine (PEPCID PO), Take 1 tablet by mouth at bedtime., Disp: , Rfl:    furosemide (LASIX) 20 MG tablet, Take 1 tablet (20 mg total) by mouth as needed for fluid., Disp: 30 tablet, Rfl: 0   losartan (COZAAR) 100 MG tablet, TAKE ONE TABLET BY MOUTH ONCE A DAY (Patient taking differently: Take 100 mg by mouth daily. Per patient taking 1/2 tablet (50 mg) daily), Disp: 90 tablet, Rfl: 3   methylPREDNISolone (MEDROL DOSEPAK) 4 MG TBPK tablet,  Take as directed on box, Disp: 21 tablet, Rfl: 0   montelukast (SINGULAIR) 10 MG tablet, TAKE ONE TABLET BY MOUTH AT BEDTIME, Disp: 90 tablet, Rfl: 3   Multiple Vitamins-Minerals (CENTRUM ADULTS PO), Take 1 tablet by mouth., Disp: , Rfl:    oxymetazoline (AFRIN) 0.05 % nasal spray, Place 1 spray into both nostrils 2 (two) times daily as needed for congestion. Use for nose bleeds, Disp: , Rfl:    pantoprazole (PROTONIX) 40 MG tablet, Take 1 tablet (40 mg total) by mouth daily., Disp: , Rfl:    Semaglutide-Weight Management (WEGOVY) 0.25 MG/0.5ML SOAJ, Start 0.25 mg qweek for four weeks, then increase to 0.5 mg dose qweek, Disp: 2 mL, Rfl: 0   [START ON 09/15/2023] Semaglutide-Weight Management (WEGOVY) 0.5 MG/0.5ML  SOAJ, After four weeks of 0.25 mg qweek, increase to 0.5 mg qweek, Disp: 2 mL, Rfl: 0   [START ON 10/06/2023] Semaglutide-Weight Management (WEGOVY) 1 MG/0.5ML SOAJ, Inject 1 mg into the skin once a week., Disp: 2 mL, Rfl: 0  Observations/Objective: Physical Exam Constitutional:      General: She is not in acute distress.    Appearance: Normal appearance. She is not ill-appearing.  Pulmonary:     Effort: Pulmonary effort is normal.  Skin:    Comments: Small 1.5 cm annular raised skin pigmented nodule lower to middle palmar aspect left hand  Neurological:     General: No focal deficit present.     Mental Status: She is alert and oriented to person, place, and time.  Psychiatric:        Mood and Affect: Mood normal.        Behavior: Behavior normal.        Thought Content: Thought content normal.     Assessment and Plan: Nodule of left palm Assessment & Plan: Suspected ganglion cyst.  Referral placed for hand surgery for eval/treat due to being symptomatic  Tylenol prn, heat to site.   Orders: -     Ambulatory referral to Hand Surgery  Obesity (BMI 30-39.9) Assessment & Plan: Pt has CAD with HTN and hyperlipidemia.  FDA approval for CAD with wegovy, will attempt to start pt on this for management as well as weight improvement as pt obese which significantly affects these other risk facts. The beneficiary does not have any FDA labeled contraindications to the requested agent including pregnancy, lactation, h/o medullary thyroid cancer or multiple endocrine neoplasia type II.    Orders: -     TSH -     T4, free -     Amb Ref to Medical Weight Management -     MVHQIO; After four weeks of 0.25 mg qweek, increase to 0.5 mg qweek  Dispense: 2 mL; Refill: 0 -     Wegovy; Start 0.25 mg qweek for four weeks, then increase to 0.5 mg dose qweek  Dispense: 2 mL; Refill: 0 -     NGEXBM; Inject 1 mg into the skin once a week.  Dispense: 2 mL; Refill: 0  Hyperglycemia Assessment &  Plan: Ordering A1c pending results.   Orders: -     TSH -     T4, free -     Hemoglobin A1c; Future  Neuropathy Assessment & Plan: B12 ordered pending results.    Orders: -     Vitamin B12  Vitamin D deficiency -     VITAMIN D 25 Hydroxy (Vit-D Deficiency, Fractures)  Pure hypercholesterolemia Assessment & Plan: Continue atorvastatin 20 mg nightly. Ordered lipid panel,  pending results. Work on low cholesterol diet and exercise as tolerated   Orders: -     Lipid panel -     [id number]; After four weeks of 0.25 mg qweek, increase to 0.5 mg qweek  Dispense: 2 mL; Refill: 0 -     Wegovy; Start 0.25 mg qweek for four weeks, then increase to 0.5 mg dose qweek  Dispense: 2 mL; Refill: 0 -     GNFAOZ; Inject 1 mg into the skin once a week.  Dispense: 2 mL; Refill: 0  CVD (cardiovascular disease) -     HYQMVH; After four weeks of 0.25 mg qweek, increase to 0.5 mg qweek  Dispense: 2 mL; Refill: 0 -     Wegovy; Start 0.25 mg qweek for four weeks, then increase to 0.5 mg dose qweek  Dispense: 2 mL; Refill: 0 -     QIONGE; Inject 1 mg into the skin once a week.  Dispense: 2 mL; Refill: 0  Coronary artery disease involving native coronary artery of native heart without angina pectoris Assessment & Plan: Will see if GLP 1 can be approved   Orders: -     XBMWUX; After four weeks of 0.25 mg qweek, increase to 0.5 mg qweek  Dispense: 2 mL; Refill: 0 -     Wegovy; Start 0.25 mg qweek for four weeks, then increase to 0.5 mg dose qweek  Dispense: 2 mL; Refill: 0 -     LKGMWN; Inject 1 mg into the skin once a week.  Dispense: 2 mL; Refill: 0  Nonrheumatic mitral valve regurgitation -     UUVOZD; After four weeks of 0.25 mg qweek, increase to 0.5 mg qweek  Dispense: 2 mL; Refill: 0 -     Wegovy; Start 0.25 mg qweek for four weeks, then increase to 0.5 mg dose qweek  Dispense: 2 mL; Refill: 0 -     GUYQIH; Inject 1 mg into the skin once a week.  Dispense: 2 mL; Refill: 0  Essential  hypertension, benign Assessment & Plan: Continue amlodipine 5 mg and losartan 100 mg     Orders: -     KVQQVZ; After four weeks of 0.25 mg qweek, increase to 0.5 mg qweek  Dispense: 2 mL; Refill: 0 -     Wegovy; Start 0.25 mg qweek for four weeks, then increase to 0.5 mg dose qweek  Dispense: 2 mL; Refill: 0 -     DGLOVF; Inject 1 mg into the skin once a week.  Dispense: 2 mL; Refill: 0    Follow Up Instructions: Return in about 3 months (around 11/25/2023) for f/u weight loss medication.   I discussed the assessment and treatment plan with the patient. The patient was provided an opportunity to ask questions and all were answered. The patient agreed with the plan and demonstrated an understanding of the instructions.   The patient was advised to call back or seek an in-person evaluation if the symptoms worsen or if the condition fails to improve as anticipated.  The above assessment and management plan was discussed with the patient. The patient verbalized understanding of and has agreed to the management plan. Patient is aware to call the clinic if symptoms persist or worsen. Patient is aware when to return to the clinic for a follow-up visit. Patient educated on when it is appropriate to go to the emergency department.     Mort Sawyers, MSN, APRN, FNP-C [place] [facility]

## 2023-09-20 DIAGNOSIS — J301 Allergic rhinitis due to pollen: Secondary | ICD-10-CM | POA: Diagnosis not present

## 2023-09-27 DIAGNOSIS — J301 Allergic rhinitis due to pollen: Secondary | ICD-10-CM | POA: Diagnosis not present

## 2023-09-29 ENCOUNTER — Other Ambulatory Visit (INDEPENDENT_AMBULATORY_CARE_PROVIDER_SITE_OTHER): Payer: Medicare PPO

## 2023-09-29 ENCOUNTER — Ambulatory Visit: Payer: Medicare PPO | Admitting: Orthopedic Surgery

## 2023-09-29 DIAGNOSIS — M79642 Pain in left hand: Secondary | ICD-10-CM

## 2023-09-29 DIAGNOSIS — R2232 Localized swelling, mass and lump, left upper limb: Secondary | ICD-10-CM

## 2023-09-29 NOTE — Progress Notes (Signed)
Heather Beasley - 67 y.o. female MRN [id number]  Date of birth: 14-Dec-1955  Office Visit Note: Visit Date: 09/29/2023 PCP: Mort Sawyers, FNP Referred by: Mort Sawyers, FNP  Subjective: No chief complaint on file.  HPI: Heather Beasley is a pleasant 67 y.o. female who presents today for evaluation of a palmar mass to the left hand.  She states she first noted this mass approximately 1 year prior.  It has grown in size since that time with associated increasing pain.  No other prior history of masses or nodules in the hands.  Has not undergone prior intervention or treatment for this.  Pertinent ROS were reviewed with the patient and found to be negative unless otherwise specified above in HPI.   Visit Reason left hand palmar mass Duration of symptoms: 1 year Hand dominance: left Occupation: Diabetic: No Smoking: Yes Heart/Lung History: CAD Blood Thinners: Plavix  Prior Testing/EMG: none Injections (Date): none Treatments: none Prior Surgery: none  2 months of increased pain The nodule has grown in size Nodule on palmar side of hand   Assessment & Plan: Visit Diagnoses:  1. Pain in left hand     Plan: Discussion was had with the patient today regarding the left hand palmar mass.  Based on the history, given that it has increased in size and has become more painful, this should be further investigated with an MRI study of the left hand to better delineate pathology and diagnosis.  Based on this workup, we can then better determine further steps.   We discussed the differential diagnosis for the mass, as well as potential treatment options ranging from conservative to surgical.  She expressed understanding, will return after the MRI study is complete to review the results and discuss appropriate treatment.  Follow-up: No follow-ups on file.   Meds & Orders: No orders of the defined types were placed in this encounter.   Orders Placed This Encounter  Procedures   XR  Hand Complete Left   MR HAND LEFT W WO CONTRAST     Procedures: No procedures performed      Clinical History: No specialty comments available.  She reports that she quit smoking about 9 months ago. Her smoking use included cigarettes. She started smoking about 39 years ago. She has a 39 pack-year smoking history. She has never used smokeless tobacco.  Recent Labs    08/25/23 1055  HGBA1C 6.1    Objective:   Vital Signs: There were no vitals taken for this visit.  Physical Exam  Gen: Well-appearing, in no acute distress; non-toxic CV: Regular Rate. Well-perfused. Warm.  Resp: Breathing unlabored on room air; no wheezing. Psych: Fluid speech in conversation; appropriate affect; normal thought process  Ortho Exam Left hand: - Palmar mass notable, centralized, distal to the wrist crease, measures approximate 1 cm x 1 cm, skin is blanchable over this area, soft, compressible, minimally mobile - Sensation is intact in all distributions, Allen's test demonstrates appropriate refill to both radial and ulnar collateral flow at the wrist level - Full digital range of motion, composite fist without restriction, wrist range of motion is well-preserved  Imaging: XR Hand Complete Left  Result Date: 09/29/2023 X-rays of the left hand, multiple views were performed today X-rays demonstrate stable appearance of the radiocarpal and midcarpal articulations without significant abnormalities.  Small area of calcification notable distal to the ulna at the level of the wrist joint.  Mild/moderate narrowing notable at the distal radial ulnar joint as  well.   Past Medical/Family/Surgical/Social History: Medications & Allergies reviewed per EMR, new medications updated. Patient Active Problem List   Diagnosis Date Noted   Obesity (BMI 30-39.9) 08/25/2023   Nodule of left palm 08/25/2023   CVD (cardiovascular disease) 08/25/2023   Coronary artery disease involving native coronary artery of native  heart without angina pectoris 08/25/2023   Nonrheumatic mitral valve regurgitation 08/25/2023   Status post lumbar spinal fusion 03/08/2023   Lumbar adjacent segment disease with spondylolisthesis 03/01/2023   S/P lumbar fusion 03/01/2023   Lumbar spinal stenosis due to adjacent segment disease after fusion procedure (L4-S1 fusion, Left L3/4) 11/11/2022   Lumbar post-laminectomy syndrome 11/11/2022   Chronic pain syndrome 11/11/2022   Sacroiliac joint pain 11/11/2022   Lumbar radiculopathy 07/17/2022   History of left foot drop 07/17/2022   Vitamin B12 deficiency 04/14/2022   Snoring 01/20/2022   DOE (dyspnea on exertion) 01/08/2022   Elevated brain natriuretic peptide (BNP) level 01/08/2022   Pulmonary emphysema (HCC) 01/08/2022   Pulmonary nodule less than 6 mm determined by computed tomography of lung 01/08/2022   Family history of breast cancer in female 11/17/2021   Carotid stenosis, left 06/27/2020   Primary osteoarthritis involving multiple joints 03/01/2018   Osteopenia 02/14/2018   HLD (hyperlipidemia) 12/01/2016   Irritable bowel syndrome with diarrhea 12/01/2016   Cancer of nasal cavity and sinus (HCC) 07/07/2015   Essential hypertension, benign 08/08/2013   Mixed urge and stress incontinence 03/27/2013   Allergic rhinitis 12/13/2012   Chronic headaches 02/22/2012   GERD (gastroesophageal reflux disease) 02/22/2012   Vitamin D deficiency 01/04/2012   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; T1 N0 M0; s/p adjuvant XRT; did not require  systemic chemotherapy   Cervical spondylosis    Colitis    Colon polyps    COPD (chronic obstructive pulmonary disease) (HCC)    Coronary artery disease    a.) cCTA 01/26/2022: Ca score 80.3 (82nd percentile for age/sex match control)   DDD (degenerative disc disease), cervical    a.) ACDF C3-C6   Depression    Diastolic dysfunction    a.) TTE 06/30/2013: EF 55-6%, mild LAE, mild MR, G1DD; b.) TTE 01/21/2022: EF 70-75%, mod LVH, mild MR, G1DD   Diverticulosis    GERD (gastroesophageal reflux disease)    Glaucoma    she reports that she does not have glacoma, she has thick corneas   Headaches, cluster    Heart murmur    HTN (hypertension)    Hyperlipidemia    IBS (irritable bowel syndrome)    Kidney stones    Long term current use of antithrombotics/antiplatelets    a.) clopidogrel   Lumbar adjacent segment disease with spondylolisthesis    Melanoma (HCC) 1995   above the L knee    Neuropathy    Neuropathy of left lower extremity    Nose colonized with MRSA 02/16/2023   a.) surgical PCR (+) 02/16/2023 prior to L3-4 LATERAL LUMBAR INTERBODY FUSION; L3-S1 POSTERIOR SPINAL FUSION   PFO (patent foramen ovale)    a.)  cCTA 01/26/2022 --> PFO with L to R contrast flow   Pneumonia    PONV (postoperative nausea and vomiting)    2012 - during lumbar fusion she developed spinal fluid leak, and had to have 3 units on Blood, BP dropped, and in ICU,   Tobacco abuse    Family History  Problem Relation Age of Onset   Hypertension Mother    Hyperlipidemia Mother    Diabetes Mother    Colon cancer Father    Pancreatic cancer Father    Cancer Father        Colon & Pancreatic   Breast cancer Paternal Grandmother 59   Diabetes Paternal Grandmother    Coronary artery disease Paternal Grandmother    Heart failure Paternal Grandmother    Cancer Paternal Grandmother        breast   Stroke Brother    Past Surgical History:  Procedure Laterality Date   ABDOMINAL HYSTERECTOMY  1998    ANTERIOR LUMBAR FUSION N/A 03/01/2023   Procedure: L3-4 LATERAL LUMBAR INTERBODY FUSION;  Surgeon: Venetia Night, MD;  Location: [facility];  Service: Neurosurgery;  Laterality: N/A;   APPENDECTOMY  1998   APPLICATION OF INTRAOPERATIVE CT SCAN N/A 03/01/2023   Procedure: APPLICATION OF INTRAOPERATIVE CT SCAN;  Surgeon: Venetia Night, MD;  Location: [facility];  Service: Neurosurgery;  Laterality: N/A;   BACK SURGERY     CAROTID PTA/STENT INTERVENTION Left 06/27/2020   Procedure: CAROTID PTA/STENT INTERVENTION;  Surgeon: Annice Needy, MD;  Location: [facility];  Service: Cardiovascular;  Laterality: Left;   CATARACT EXTRACTION     CERVICAL FUSION  2010   C3-C6   CHOLECYSTECTOMY  08/2011   Procedure: Cholecystectomy; Location: [facility]; Surgeon: Renda Rolls, MD   COLONOSCOPY N/A    Procedure: COLONOSCOPY; Location: [facility]; Surgeon: Lynnae Prude, MD   CYSTOSCOPY N/A    Procedure: CYSTOSCOPY; Location: [facility]; Surgeon: Irineo Axon, MD   DILATION AND CURETTAGE OF UTERUS     ESOPHAGOGASTRODUODENOSCOPY  2012   Procedure: ESOPHAGOGASTRODUODENOSCOPY; Location: [facility]; Surgeon: Lynnae Prude, MD   FRACTURE SURGERY  2001   plate in right wrist and arm   IMAGE GUIDED SINUS SURGERY N/A 06/25/2015   Procedure: IMAGE GUIDED SINUS SURGERY;  Surgeon: Linus Salmons, MD;  Location: [facility];  Service: ENT;  Laterality: N/A;   LUMBAR SPINE SURGERY  07/2004   OOPHORECTOMY     POLYPECTOMY N/A 06/25/2015   Procedure: POLYPECTOMY NASAL;  Surgeon: Linus Salmons, MD;  Location: [facility];  Service: ENT;  Laterality: N/A;   TONSILLECTOMY  1968   VAGINAL DELIVERY     x2   Social History   Occupational History   Occupation: Conservation officer, nature   Occupation: disabled  Tobacco Use   Smoking status: Former    Current packs/day: 0.00    Average packs/day: 1 pack/day for 39.0 years (39.0 ttl pk-yrs)    Types: Cigarettes    Start date: 12/20/1983    Quit date: 12/19/2022    Years since quitting: 0.7    Smokeless tobacco: Never   Tobacco comments:    Using vape with no nicotine   Vaping Use   Vaping status: Every Day   Substances: Flavoring  Substance and Sexual Activity   Alcohol use: Not Currently   Drug use: No   Sexual activity: Not Currently    Partners: Male    Derak Schurman Fara Boros) Denese Killings, M.D. [facility] 6:38 PM

## 2023-10-04 DIAGNOSIS — J301 Allergic rhinitis due to pollen: Secondary | ICD-10-CM | POA: Diagnosis not present

## 2023-10-05 ENCOUNTER — Ambulatory Visit: Payer: Medicare PPO | Admitting: Pulmonary Disease

## 2023-10-05 ENCOUNTER — Encounter: Payer: Self-pay | Admitting: Pulmonary Disease

## 2023-10-05 VITALS — BP 138/86 | HR 89 | Temp 97.3°F | Ht 65.0 in | Wt 189.4 lb

## 2023-10-05 DIAGNOSIS — I517 Cardiomegaly: Secondary | ICD-10-CM

## 2023-10-05 DIAGNOSIS — R052 Subacute cough: Secondary | ICD-10-CM

## 2023-10-05 DIAGNOSIS — Z7289 Other problems related to lifestyle: Secondary | ICD-10-CM | POA: Diagnosis not present

## 2023-10-05 DIAGNOSIS — Z87891 Personal history of nicotine dependence: Secondary | ICD-10-CM

## 2023-10-05 DIAGNOSIS — R0602 Shortness of breath: Secondary | ICD-10-CM

## 2023-10-05 DIAGNOSIS — J449 Chronic obstructive pulmonary disease, unspecified: Secondary | ICD-10-CM

## 2023-10-05 DIAGNOSIS — K219 Gastro-esophageal reflux disease without esophagitis: Secondary | ICD-10-CM | POA: Diagnosis not present

## 2023-10-05 LAB — NITRIC OXIDE: Nitric Oxide: 9

## 2023-10-05 MED ORDER — DOXYCYCLINE HYCLATE 100 MG PO TABS: 100.0000 mg | ORAL_TABLET | Freq: Two times a day (BID) | ORAL | 0 refills | Status: AC

## 2023-10-05 NOTE — Progress Notes (Unsigned)
Subjective:    Patient ID: Heather Beasley, female    DOB: 17-Feb-1956, 67 y.o.   MRN: [id number]  Patient Care Team: Mort Sawyers, FNP as PCP - General (Family Medicine) Kathleene Hazel, MD as PCP - Cardiology (Cardiology) Carmina Miller, MD as Referring Physician (Radiation Oncology) Linus Salmons, MD (Otolaryngology) Venetia Night, MD as Consulting Physician (Neurosurgery) Georgiana Spinner, NP as Nurse Practitioner (Vascular Surgery)  Chief Complaint  Patient presents with   Follow-up    Cough with clear/white sputum. Going for 1 month. No wheezing. No SOB.    BACKGROUND/INTERVAL:Heather Beasley is a 67 year old former cigarette smoker (quit 2/24, 39 PY) with a history as noted below, who presents for follow-up on the issue of dyspnea and COPD.  Patient was last seen on 05 May 2023 at that time she was instructed to continue [place].  Since that visit she has not had any exacerbations or hospital admissions.  Patient however does have issues with cough productive of clear/white sputum for the last month.  HPI Discussed the use of AI scribe software for clinical note transcription with the patient, who gave verbal consent to proceed.  History of Present Illness   The patient, a 67 year old with a significant smoking history and a diagnosis of stage 2 COPD, presents with a persistent cough and wheezing that began approximately a month and a half ago following a head cold. The cough was initially severe and accompanied by nasal congestion. The patient has been using albuterol and Breztri to manage these symptoms, with some improvement noted. However, the patient continues to experience coughing, particularly at night, and occasional clear or white sputum production. The patient completed a course of prednisone three to four weeks ago, which provided some relief.  The patient also has a history of heart disease, specifically asymmetric septal hypertrophy, and is under the care of  a cardiologist. She is currently on medication for this condition. Additionally, the patient has been managing reflux with Protonix and Pepcid at bedtime.  She follows with GI for this issue.  The patient has been approved for a monthly supply of Breztri and has been receiving it regularly. She is due for a lung cancer screening in the upcoming months. The patient has a known allergy to amoxicillin and erythromycin, but can tolerate doxycycline. She had a sinus infection earlier in the year that was treated with doxycycline. The patient also has high blood pressure.     DATA 01/14/2022 CT chest lung cancer screening: Lung RADS 2, 8 mm groundglass nodule left lower lobe, emphysema, coronary calcifications noted 01/21/2022 echocardiogram: LVEF 70 to 75%, hyperdynamic LV, no wall motion abnormalities, moderate ASH and grade 1 DD.  Mild mitral valve regurgitation 01/26/2022 coronary CTA: Minimal coronary artery disease, PFO with left-to-right contrast flow 03/05/2022 PFTs: FEV1 1.60 L or 62% predicted, FVC 2.52 L or 75% predicted, FEV1/FVC 64%, no bronchodilator response, lung volumes normal.  Diffusion capacity moderately reduced, consistent with moderately severe obstructive airways disease, moderate diffusion impairment, probable concomitant restriction likely on the basis of obesity 05/01/2022 sleep study (in lab): No indication of sleep disordered breathing.  Consider other etiologies of somnolence. 01/15/2023 CT chest lung cancer screening: Lung RADS 2, 8 mm groundglass nodule on the left lower lobe unchanged, emphysema, coronary calcifications.  Review of Systems A 10 point review of systems was performed and it is as noted above otherwise negative.   Patient Active Problem List   Diagnosis Date Noted   Obesity (BMI 30-39.9) 08/25/2023  Nodule of left palm 08/25/2023   CVD (cardiovascular disease) 08/25/2023   Coronary artery disease involving native coronary artery of native heart without  angina pectoris 08/25/2023   Nonrheumatic mitral valve regurgitation 08/25/2023   Status post lumbar spinal fusion 03/08/2023   Lumbar adjacent segment disease with spondylolisthesis 03/01/2023   S/P lumbar fusion 03/01/2023   Lumbar spinal stenosis due to adjacent segment disease after fusion procedure (L4-S1 fusion, Left L3/4) 11/11/2022   Lumbar post-laminectomy syndrome 11/11/2022   Chronic pain syndrome 11/11/2022   Sacroiliac joint pain 11/11/2022   Lumbar radiculopathy 07/17/2022   History of left foot drop 07/17/2022   Vitamin B12 deficiency 04/14/2022   Snoring 01/20/2022   DOE (dyspnea on exertion) 01/08/2022   Elevated brain natriuretic peptide (BNP) level 01/08/2022   Pulmonary emphysema (HCC) 01/08/2022   Pulmonary nodule less than 6 mm determined by computed tomography of lung 01/08/2022   Family history of breast cancer in female 11/17/2021   Carotid stenosis, left 06/27/2020   Primary osteoarthritis involving multiple joints 03/01/2018   Osteopenia 02/14/2018   HLD (hyperlipidemia) 12/01/2016   Irritable bowel syndrome with diarrhea 12/01/2016   Cancer of nasal cavity and sinus (HCC) 07/07/2015   Essential hypertension, benign 08/08/2013   Mixed urge and stress incontinence 03/27/2013   Allergic rhinitis 12/13/2012   Chronic headaches 02/22/2012   GERD (gastroesophageal reflux disease) 02/22/2012   Vitamin D deficiency 01/04/2012    Social History   Tobacco Use   Smoking status: Former    Current packs/day: 0.00    Average packs/day: 1 pack/day for 39.0 years (39.0 ttl pk-yrs)    Types: Cigarettes    Start date: 12/20/1983    Quit date: 12/19/2022    Years since quitting: 0.7   Smokeless tobacco: Never   Tobacco comments:    Using vape with no nicotine   Substance Use Topics   Alcohol use: Not Currently    Allergies  Allergen Reactions   Albumin (Human) Rash   Amoxicillin-Pot Clavulanate Hives   Gabapentin Shortness Of Breath   Latex Dermatitis and  Rash   Betadine [Povidone Iodine] Dermatitis, Rash and Other (See Comments)    Skin burning Topical betadine and iodine have this reaction with patient.  IV contrast is NOT a problem.  jkl   Sucralfate     Other reaction(s): Other (See Comments) Abdominal Pain   Amoxicillin     REACTION: rash   Chantix [Varenicline Tartrate]     Heart racing   Erythromycin     REACTION: rash   Sucralfate Nausea And Vomiting    Heart racing Lightheaded    Tegaderm Ag Mesh [Silver] Hives    Mild     Current Meds  Medication Sig   acetaminophen (TYLENOL) 650 MG CR tablet Take 650 mg by mouth every 8 (eight) hours as needed for pain.   albuterol (VENTOLIN HFA) 108 (90 Base) MCG/ACT inhaler Inhale 1-2 puffs into the lungs every 6 (six) hours as needed (for cough).   atorvastatin (LIPITOR) 20 MG tablet TAKE ONE TABLET BY MOUTH ONCE A DAY   benzonatate (TESSALON) 200 MG capsule Take 200 mg by mouth 3 (three) times daily as needed for cough.   Budeson-Glycopyrrol-Formoterol (BREZTRI AEROSPHERE) 160-9-4.8 MCG/ACT AERO Inhale 2 puffs into the lungs in the morning and at bedtime.   cetirizine (ZYRTEC) 10 MG tablet Take 10 mg by mouth daily. Allergies.   Cholecalciferol (VITAMIN D) 50 MCG (2000 UT) CAPS Take 2,000 Units by mouth.   clopidogrel (PLAVIX)  75 MG tablet Take 1 tablet (75 mg total) by mouth daily.   cyclobenzaprine (FLEXERIL) 10 MG tablet Take 1 tablet (10 mg total) by mouth 3 (three) times daily as needed for muscle spasms.   doxycycline (VIBRA-TABS) 100 MG tablet Take 1 tablet (100 mg total) by mouth 2 (two) times daily for 7 days.   EPINEPHrine 0.3 mg/0.3 mL IJ SOAJ injection Inject 1 mg into the skin as needed.   Famotidine (PEPCID PO) Take 1 tablet by mouth at bedtime.   furosemide (LASIX) 20 MG tablet Take 1 tablet (20 mg total) by mouth as needed for fluid.   losartan (COZAAR) 100 MG tablet TAKE ONE TABLET BY MOUTH ONCE A DAY (Patient taking differently: Take 100 mg by mouth daily. Per  patient taking 1/2 tablet (50 mg) daily)   montelukast (SINGULAIR) 10 MG tablet TAKE ONE TABLET BY MOUTH AT BEDTIME   Multiple Vitamins-Minerals (CENTRUM ADULTS PO) Take 1 tablet by mouth.   oxymetazoline (AFRIN) 0.05 % nasal spray Place 1 spray into both nostrils 2 (two) times daily as needed for congestion. Use for nose bleeds   pantoprazole (PROTONIX) 40 MG tablet Take 1 tablet (40 mg total) by mouth daily.    Immunization History  Administered Date(s) Administered   Influenza Split 07/27/2011, 08/25/2012   Influenza Whole 09/14/2007   Influenza,inj,Quad PF,6+ Mos 08/10/2013, 08/01/2015, 07/16/2016, 08/02/2018, 07/15/2019   Influenza,inj,quad, With Preservative 08/13/2017   Influenza-Unspecified 07/26/2021, 08/05/2022, 07/29/2023   Pneumococcal Conjugate-13 07/16/2016   Pneumococcal Polysaccharide-23 11/27/2011, 03/01/2018   Respiratory Syncytial Virus Vaccine,Recomb Aduvanted(Arexvy) 08/26/2022        Objective:     BP 138/86 (BP Location: Right Arm, Cuff Size: Normal)   Pulse 89   Temp (!) 97.3 F (36.3 C)   Ht 5\' 5"  (1.651 m)   Wt 189 lb 6.4 oz (85.9 kg)   SpO2 96%   BMI 31.52 kg/m   SpO2: 96 % O2 Device: None (Room air)  GENERAL: Overweight woman, no acute distress, fully ambulatory.  No conversational dyspnea.  Nasal quality to the speech. HEAD: Normocephalic, atraumatic.  EYES: Pupils equal, round, reactive to light.  No scleral icterus.  MOUTH: Wears dentures upper and lower, class III airway.  No thrush. NECK: Supple. No thyromegaly. Trachea midline. No JVD.  No adenopathy. PULMONARY: Good air entry bilaterally.  Coarse otherwise, no adventitious sounds. CARDIOVASCULAR: S1 and S2. Regular rate and rhythm.  Grade 2/6 systolic ejection murmur which increases with Valsalva.  No gallop. ABDOMEN: Benign. MUSCULOSKELETAL: No joint deformity, no clubbing, no edema.  NEUROLOGIC: Chronic left foot drop.  This affects gait.  No other localizing finding. SKIN:  Intact,warm,dry.  Nails are short (patient is a nail biter). PSYCH: Mood and behavior normal.    Lab Results  Component Value Date   NITRICOXIDE 9 10/05/2023    Assessment & Plan:     ICD-10-CM   1. Subacute cough  R05.2 Nitric oxide    2. Stage 2 moderate COPD by GOLD classification (HCC)  J44.9     3. Asymmetric septal hypertrophy  I51.7     4. Gastroesophageal reflux disease, unspecified whether esophagitis present  K21.9     5. Vapes non-nicotine containing substance  Z72.89     6. Former heavy cigarette smoker (20-39 per day)  Z87.891       Orders Placed This Encounter  Procedures   Nitric oxide   Meds ordered this encounter  Medications   doxycycline (VIBRA-TABS) 100 MG tablet    Sig:  Take 1 tablet (100 mg total) by mouth 2 (two) times daily for 7 days.    Dispense:  14 tablet    Refill:  0   Discussion:    Chronic Obstructive Pulmonary Disease (COPD) Stage II COPD with recent exacerbation likely due to a viral upper respiratory infection. Symptoms include persistent cough, wheezing, and occasional sputum production. Recent prednisone use provided relief. Current medications: albuterol and Breztri. No major airway inflammation noted. Discussed that the cough may last 8-12 weeks regardless of treatment. Explained doxycycline's anti-inflammatory effects. Patient prefers to avoid additional steroids. - Prescribe doxycycline - Recommend using Breztri 15-20 minutes before bedtime - Consider using albuterol before bed if not using Breztri - Recommend Mucinex DM for cough control - Follow up in 3 months  Gastroesophageal Reflux Disease (GERD) GERD managed with Protonix in the morning and Pepcid at bedtime. No current issues with reflux. - Continue Protonix in the morning - Continue Pepcid at bedtime  Asymmetric Septal Hypertrophy (ASH) Continues to follow up with cardiology. No new symptoms reported. - Continue current cardiology follow-up and  medications  General Health Maintenance Due for lung cancer screening in April 2025. - Schedule lung cancer screening for April 2025  Follow-up - Follow up in 3 months - Advise to call if symptoms worsen.      Advised if symptoms do not improve or worsen, to please contact office for sooner follow up or seek emergency care.    I spent 30 minutes of dedicated to the care of this patient on the date of this encounter to include pre-visit review of records, face-to-face time with the patient discussing conditions above, post visit ordering of testing, clinical documentation with the electronic health record, making appropriate referrals as documented, and communicating necessary findings to members of the patients care team.     C. Danice Goltz, MD Advanced Bronchoscopy PCCM [facility]    *This note was generated using voice recognition software/Dragon and/or AI transcription program.  Despite best efforts to proofread, errors can occur which can change the meaning. Any transcriptional errors that result from this process are unintentional and may not be fully corrected at the time of dictation.

## 2023-10-05 NOTE — Patient Instructions (Addendum)
VISIT SUMMARY:  During today's visit, we discussed your ongoing symptoms related to COPD, including your persistent cough and wheezing. We also reviewed your management plan for GERD and your heart condition. Additionally, we talked about your upcoming lung cancer screening and general health maintenance.  YOUR PLAN:  -CHRONIC OBSTRUCTIVE PULMONARY DISEASE (COPD): COPD is a chronic lung condition that makes it hard to breathe. Your recent symptoms are likely due to a viral infection. We will start you on doxycycline for 7 days to help with inflammation.Make sure to be careful with sun exposure while on doxycycline. Continue using Breztri 15-20 minutes before bedtime and consider using albuterol before bed if not using Breztri. Mucinex DM is recommended for cough control. We will follow up in 3 months.  -GASTROESOPHAGEAL REFLUX DISEASE (GERD): GERD is a condition where stomach acid frequently flows back into the tube connecting your mouth and stomach. Continue taking Protonix in the morning and add Pepcid at bedtime to manage your symptoms.  -ASYMMETRIC SEPTAL HYPERTROPHY (ASH): ASH is a condition where the wall between the heart's chambers is thicker than normal. Continue your current medications and follow up with your cardiologist as planned.  -GENERAL HEALTH MAINTENANCE: You are due for a lung cancer screening in April 2025. Please make sure to schedule this important screening.  INSTRUCTIONS:  Please follow up in 3 months. If your symptoms worsen, call our office.

## 2023-10-06 ENCOUNTER — Encounter: Payer: Self-pay | Admitting: Pulmonary Disease

## 2023-10-11 DIAGNOSIS — J301 Allergic rhinitis due to pollen: Secondary | ICD-10-CM | POA: Diagnosis not present

## 2023-10-18 ENCOUNTER — Encounter: Payer: Self-pay | Admitting: Orthopedic Surgery

## 2023-10-18 DIAGNOSIS — J301 Allergic rhinitis due to pollen: Secondary | ICD-10-CM | POA: Diagnosis not present

## 2023-10-22 ENCOUNTER — Other Ambulatory Visit: Payer: Self-pay | Admitting: Family

## 2023-10-22 DIAGNOSIS — I10 Essential (primary) hypertension: Secondary | ICD-10-CM

## 2023-10-22 NOTE — Telephone Encounter (Signed)
Spoke with Heather Beasley.  She states the Losartan 100 mg was dropping her BP too low and her Cardiologist Dr. Clifton James had her to start cutting it in half back in October.  She is requesting a 50 mg tablet so she doesn't have the cut the 100 mg in half.  Her blood pressures at home has been running good.  She states around 116/70-80.  Okay to send in losartan 50 mg?

## 2023-10-22 NOTE — Telephone Encounter (Signed)
Please ask pt why she has decreased to half pill daily?  Last visit was on 100 mg What are her blood pressure averages at home?

## 2023-10-24 NOTE — Telephone Encounter (Signed)
Per Dr. Elmyra Ricks last note he advise dher to call her pharmacy to request refill from him if refills needed. Since he has changed dose it would be best for her to request refill from his office (I did not see the change reflected in his note)

## 2023-10-25 ENCOUNTER — Ambulatory Visit
Admission: RE | Admit: 2023-10-25 | Discharge: 2023-10-25 | Disposition: A | Discharge status: Home / Self Care | Payer: Medicare PPO | Source: Ambulatory Visit | Attending: Orthopedic Surgery

## 2023-10-25 DIAGNOSIS — R2232 Localized swelling, mass and lump, left upper limb: Secondary | ICD-10-CM | POA: Diagnosis not present

## 2023-10-25 DIAGNOSIS — M79642 Pain in left hand: Secondary | ICD-10-CM

## 2023-10-25 MED ORDER — GADOPICLENOL 0.5 MMOL/ML IV SOLN
7.5000 mL | Freq: Once | INTRAVENOUS | Status: AC | PRN
  Administered 2023-10-25: 7.5 mL via INTRAVENOUS

## 2023-10-26 ENCOUNTER — Ambulatory Visit (INDEPENDENT_AMBULATORY_CARE_PROVIDER_SITE_OTHER): Payer: Medicare PPO | Admitting: Neurosurgery

## 2023-10-26 DIAGNOSIS — M5412 Radiculopathy, cervical region: Secondary | ICD-10-CM

## 2023-10-26 MED ORDER — CYCLOBENZAPRINE HCL 10 MG PO TABS: 10.0000 mg | ORAL_TABLET | Freq: Three times a day (TID) | ORAL | 0 refills | Status: DC | PRN

## 2023-10-26 NOTE — Progress Notes (Signed)
   REFERRING PHYSICIAN:  Corwin Antu, Fnp [address] [place],  [place] [address]  DOS: 03/01/23  L3-4 XLIF and L3-S1 PSF   HISTORY OF PRESENT ILLNESS: 10/26/2023 She improved with prednisone  though she is hurting currently due to activity. She has not started PT.   08/17/2023  Heather Beasley is status post L3-4 XLIF and L3-S1 PSF .  She is doing very well from her back surgery.  Over the past 3 weeks, she has had pain in between her shoulder blades extending up her neck.  She also has discomfort down her right arm.  She has numbness in her right forearm as well as her for second and third fingers.  This is all new over the past 3 weeks.  She denies weakness or any other symptoms of myelopathy.  She is doing extremely well.  PHYSICAL EXAMINATION:  Telephone visit today.  IMAGING: No complications noted on x-rays of her lumbar spine  ASSESSMENT/PLAN:  Heather Beasley is doing well s/p above surgery.    She has some new symptoms concerning for right sided cervical radiculopathy.  This is most consistent with a C6 distribution, but could be C7.  She has history of C3-6 anterior cervical discectomy and fusion many years ago.  She has had continued symptoms since I last saw her 2 months ago.  I would like to refer her back to physical therapy.  I will also pursue an MRI scan as she has now had symptoms for several months.  This visit was performed via telephone.  Patient location: home Provider location: office  I spent a total of 4 minutes non-face-to-face activities for this visit on the date of this encounter including review of current clinical condition and response to treatment.  The patient is aware of and accepts the limits of this telehealth visit. SABRA Reeves [facility] Department of neurosurgery

## 2023-11-06 ENCOUNTER — Other Ambulatory Visit: Payer: Medicare PPO

## 2023-11-08 DIAGNOSIS — J301 Allergic rhinitis due to pollen: Secondary | ICD-10-CM | POA: Diagnosis not present

## 2023-11-10 ENCOUNTER — Encounter: Payer: Self-pay | Admitting: Family

## 2023-11-10 ENCOUNTER — Ambulatory Visit (INDEPENDENT_AMBULATORY_CARE_PROVIDER_SITE_OTHER)
Admission: RE | Admit: 2023-11-10 | Discharge: 2023-11-10 | Disposition: A | Discharge status: Home / Self Care | Payer: Medicare PPO | Source: Ambulatory Visit | Attending: Family

## 2023-11-10 ENCOUNTER — Ambulatory Visit: Payer: Medicare PPO | Admitting: Family

## 2023-11-10 VITALS — BP 138/88 | HR 84 | Temp 97.7°F | Ht 65.0 in | Wt 187.4 lb

## 2023-11-10 DIAGNOSIS — J22 Unspecified acute lower respiratory infection: Secondary | ICD-10-CM | POA: Diagnosis not present

## 2023-11-10 DIAGNOSIS — J439 Emphysema, unspecified: Secondary | ICD-10-CM | POA: Diagnosis not present

## 2023-11-10 DIAGNOSIS — R062 Wheezing: Secondary | ICD-10-CM

## 2023-11-10 MED ORDER — DOXYCYCLINE HYCLATE 100 MG PO TABS: 100.0000 mg | ORAL_TABLET | Freq: Two times a day (BID) | ORAL | 0 refills | Status: AC

## 2023-11-10 MED ORDER — PREDNISONE 20 MG PO TABS: ORAL_TABLET | ORAL | 0 refills | Status: DC

## 2023-11-10 MED ORDER — CEFTRIAXONE SODIUM 1 G IJ SOLR
1.0000 g | Freq: Once | INTRAMUSCULAR | Status: AC
  Administered 2023-11-10: 1 g via INTRAMUSCULAR

## 2023-11-10 NOTE — Assessment & Plan Note (Signed)
 Concern for pneumonia CXR today  Could be COPD exacerbation  Prednisone  40 mg once daily for five days  Rocephin  1 g in office , pt ok with cephalosporins rash only with amox and has had keflex  in past without concern  RX doxycycline  100 mg po bid x 10 days  Take antibiotic as prescribed. Increase oral fluids. Pt to f/u if sx worsen and or fail to improve in 2-3 days.

## 2023-11-10 NOTE — Progress Notes (Signed)
 Established Patient Office Visit  Subjective:   Patient ID: PASHANCE WILTSE, female    DOB: 01-26-1956  Age: 68 y.o. MRN: [id number]  CC:  Chief Complaint  Patient presents with   Acute Visit    Possible sinus infection. Reports sinus pressure, sinus congestion, dry eyes, coughing. Denies body aches, fever, sore throat.    HPI: SUEANNA WHEELDON is a 68 y.o. female presenting on 11/10/2023 for Acute Visit (Possible sinus infection. Reports sinus pressure, sinus congestion, dry eyes, coughing. Denies body aches, fever, sore throat.)  Started with symptoms six days ago with sinus pressure and nasal congestion, dry eyes, body aches, fever and sore throat. She also reports a cough occasionally productive. Has a tinge of yellow.        ROS: Negative unless specifically indicated above in HPI.   Relevant past medical history reviewed and updated as indicated.   Allergies and medications reviewed and updated.   Current Outpatient Medications:    acetaminophen  (TYLENOL ) 650 MG CR tablet, Take 650 mg by mouth every 8 (eight) hours as needed for pain., Disp: , Rfl:    albuterol  (VENTOLIN  HFA) 108 (90 Base) MCG/ACT inhaler, Inhale 1-2 puffs into the lungs every 6 (six) hours as needed (for cough)., Disp: 18 g, Rfl: 2   atorvastatin  (LIPITOR) 20 MG tablet, TAKE ONE TABLET BY MOUTH ONCE A DAY, Disp: 90 tablet, Rfl: 3   benzonatate  (TESSALON ) 200 MG capsule, Take 200 mg by mouth 3 (three) times daily as needed for cough., Disp: , Rfl:    Budeson-Glycopyrrol-Formoterol  (BREZTRI  AEROSPHERE) 160-9-4.8 MCG/ACT AERO, Inhale 2 puffs into the lungs in the morning and at bedtime., Disp: 11.8 g, Rfl: 0   cetirizine (ZYRTEC) 10 MG tablet, Take 10 mg by mouth daily. Allergies., Disp: , Rfl:    Cholecalciferol (VITAMIN D ) 50 MCG (2000 UT) CAPS, Take 2,000 Units by mouth., Disp: , Rfl:    clopidogrel  (PLAVIX ) 75 MG tablet, Take 1 tablet (75 mg total) by mouth daily., Disp: 90 tablet, Rfl: 3    cyclobenzaprine  (FLEXERIL ) 10 MG tablet, Take 1 tablet (10 mg total) by mouth 3 (three) times daily as needed for muscle spasms., Disp: 90 tablet, Rfl: 0   doxycycline  (VIBRA -TABS) 100 MG tablet, Take 1 tablet (100 mg total) by mouth 2 (two) times daily for 10 days., Disp: 20 tablet, Rfl: 0   EPINEPHrine  0.3 mg/0.3 mL IJ SOAJ injection, Inject 1 mg into the skin as needed., Disp: , Rfl:    Famotidine  (PEPCID  PO), Take 1 tablet by mouth at bedtime., Disp: , Rfl:    furosemide  (LASIX ) 20 MG tablet, Take 1 tablet (20 mg total) by mouth as needed for fluid., Disp: 30 tablet, Rfl: 0   losartan  (COZAAR ) 50 MG tablet, Take 1 tablet (50 mg total) by mouth daily., Disp: 90 tablet, Rfl: 1   montelukast  (SINGULAIR ) 10 MG tablet, TAKE ONE TABLET BY MOUTH AT BEDTIME, Disp: 90 tablet, Rfl: 3   Multiple Vitamins-Minerals (CENTRUM ADULTS PO), Take 1 tablet by mouth., Disp: , Rfl:    oxymetazoline  (AFRIN) 0.05 % nasal spray, Place 1 spray into both nostrils 2 (two) times daily as needed for congestion. Use for nose bleeds, Disp: , Rfl:    pantoprazole  (PROTONIX ) 40 MG tablet, Take 1 tablet (40 mg total) by mouth daily., Disp: , Rfl:    predniSONE  (DELTASONE ) 20 MG tablet, Take two tablets once daily for five days, Disp: 10 tablet, Rfl: 0  Allergies  Allergen Reactions   Albumin (  Human) Rash   Amoxicillin-Pot Clavulanate Hives   Gabapentin Shortness Of Breath   Latex Dermatitis and Rash   Betadine [Povidone Iodine ] Dermatitis, Rash and Other (See Comments)    Skin burning Topical betadine and iodine  have this reaction with patient.  IV contrast is NOT a problem.  jkl   Sucralfate     Other reaction(s): Other (See Comments) Abdominal Pain   Amoxicillin     REACTION: rash   Chantix [Varenicline Tartrate]     Heart racing   Erythromycin     REACTION: rash   Sucralfate Nausea And Vomiting    Heart racing Lightheaded    Tegaderm Ag Mesh [Silver] Hives    Mild     Objective:   BP 138/88 (BP Location:  Right Arm, Patient Position: Sitting, Cuff Size: Normal)   Pulse 84   Temp 97.7 F (36.5 C) (Temporal)   Ht 5\' 5"  (1.651 m)   Wt 187 lb 6.4 oz (85 kg)   SpO2 97%   BMI 31.18 kg/m    Physical Exam Constitutional:      General: She is not in acute distress.    Appearance: Normal appearance. She is normal weight. She is not ill-appearing, toxic-appearing or diaphoretic.  HENT:     Head: Normocephalic.     Right Ear: Tympanic membrane normal.     Left Ear: Tympanic membrane normal.     Nose:     Right Sinus: Frontal sinus tenderness present.     Left Sinus: Frontal sinus tenderness present.     Mouth/Throat:     Mouth: Mucous membranes are dry.     Pharynx: No oropharyngeal exudate or posterior oropharyngeal erythema.  Eyes:     Extraocular Movements: Extraocular movements intact.     Pupils: Pupils are equal, round, and reactive to light.  Cardiovascular:     Rate and Rhythm: Normal rate and regular rhythm.     Pulses: Normal pulses.     Heart sounds: Normal heart sounds.  Pulmonary:     Effort: Pulmonary effort is normal.     Breath sounds: No decreased air movement. Examination of the left-upper field reveals wheezing. Examination of the right-lower field reveals wheezing and rales. Wheezing and rales present.  Musculoskeletal:     Cervical back: Normal range of motion.  Neurological:     General: No focal deficit present.     Mental Status: She is alert and oriented to person, place, and time. Mental status is at baseline.  Psychiatric:        Mood and Affect: Mood normal.        Behavior: Behavior normal.        Thought Content: Thought content normal.        Judgment: Judgment normal.     Assessment & Plan:  Wheezing -     DG Chest 2 View; Future -     predniSONE ; Take two tablets once daily for five days  Dispense: 10 tablet; Refill: 0  Pulmonary emphysema, unspecified emphysema type (HCC) -     predniSONE ; Take two tablets once daily for five days  Dispense:  10 tablet; Refill: 0  Lower respiratory infection Assessment & Plan: Concern for pneumonia CXR today  Could be COPD exacerbation  Prednisone  40 mg once daily for five days  Rocephin  1 g in office , pt ok with cephalosporins rash only with amox and has had keflex  in past without concern  RX doxycycline  100 mg po bid x 10 days  Take antibiotic as prescribed. Increase oral fluids. Pt to f/u if sx worsen and or fail to improve in 2-3 days.   Orders: -     DG Chest 2 View; Future -     cefTRIAXone  Sodium -     Doxycycline  Hyclate; Take 1 tablet (100 mg total) by mouth 2 (two) times daily for 10 days.  Dispense: 20 tablet; Refill: 0     Follow up plan: Return if symptoms worsen or fail to improve.  Felicita Horns, FNP

## 2023-11-12 ENCOUNTER — Ambulatory Visit
Admission: RE | Admit: 2023-11-12 | Discharge: 2023-11-12 | Disposition: A | Discharge status: Home / Self Care | Payer: Medicare PPO | Source: Ambulatory Visit | Attending: Neurosurgery | Admitting: Neurosurgery

## 2023-11-12 DIAGNOSIS — M5412 Radiculopathy, cervical region: Secondary | ICD-10-CM

## 2023-11-12 DIAGNOSIS — Z981 Arthrodesis status: Secondary | ICD-10-CM | POA: Diagnosis not present

## 2023-11-17 DIAGNOSIS — J301 Allergic rhinitis due to pollen: Secondary | ICD-10-CM | POA: Diagnosis not present

## 2023-11-22 DIAGNOSIS — J301 Allergic rhinitis due to pollen: Secondary | ICD-10-CM | POA: Diagnosis not present

## 2023-11-24 DIAGNOSIS — M542 Cervicalgia: Secondary | ICD-10-CM | POA: Diagnosis not present

## 2023-11-25 ENCOUNTER — Encounter: Payer: Self-pay | Admitting: Neurosurgery

## 2023-11-29 DIAGNOSIS — J301 Allergic rhinitis due to pollen: Secondary | ICD-10-CM | POA: Diagnosis not present

## 2023-12-06 DIAGNOSIS — J301 Allergic rhinitis due to pollen: Secondary | ICD-10-CM | POA: Diagnosis not present

## 2023-12-09 ENCOUNTER — Ambulatory Visit (INDEPENDENT_AMBULATORY_CARE_PROVIDER_SITE_OTHER): Payer: Medicare PPO | Admitting: Neurosurgery

## 2023-12-09 DIAGNOSIS — M5412 Radiculopathy, cervical region: Secondary | ICD-10-CM

## 2023-12-09 NOTE — Progress Notes (Signed)
   REFERRING PHYSICIAN:  Mort Sawyers, Fnp [address] [place],  [place] [address]  DOS: 03/01/23  L3-4 XLIF and L3-S1 PSF   HISTORY OF PRESENT ILLNESS: 12/09/2023 Ms. Boyko is doing physical therapy.  It is not significantly helping her currently.  She continues to have pain into both arms but also into her chest wall.  Her cardiologist has worked this up and not found a cause.   10/26/2023 She improved with prednisone though she is hurting currently due to activity. She has not started PT.   08/17/2023  NOVALYNN BRANAMAN is status post L3-4 XLIF and L3-S1 PSF .  She is doing very well from her back surgery.  Over the past 3 weeks, she has had pain in between her shoulder blades extending up her neck.  She also has discomfort down her right arm.  She has numbness in her right forearm as well as her for second and third fingers.  This is all new over the past 3 weeks.  She denies weakness or any other symptoms of myelopathy.  She is doing extremely well.  PHYSICAL EXAMINATION:  Telephone visit today.  IMAGING: No complications noted on x-rays of her lumbar spine 08/17/2023  MRI C spine 11/10/2023 IMPRESSION: 1. Prior C3-C6 ACDF with solid arthrodesis. No residual canal stenosis at the operative levels. 2. Mild residual bilateral foraminal stenosis at C5-6. 3. Mild left foraminal stenosis at C6-7. 4. Moderate left and mild right foraminal stenosis at C7-T1.     Electronically Signed   By: Duanne Guess D.O.   On: 11/21/2023 11:17  ASSESSMENT/PLAN:  JACQUE BYRON is doing well s/p above surgery.    Her MRI shows some possible compression on the left side at C7-T1.  This could cause some of her symptoms.  I have recommended a C7-T1 epidural steroid injection to see if this helps her symptoms.  We will discuss her results from this injection after surgery was performed.  I will reach out to Dr. Cherylann Ratel.  This visit was performed via telephone.  Patient location:  home Provider location: office  I spent a total of 4 minutes non-face-to-face activities for this visit on the date of this encounter including review of current clinical condition and response to treatment.  The patient is aware of and accepts the limits of this telehealth visit. Venetia Night Department of neurosurgery

## 2023-12-10 DIAGNOSIS — M542 Cervicalgia: Secondary | ICD-10-CM | POA: Diagnosis not present

## 2023-12-13 DIAGNOSIS — J301 Allergic rhinitis due to pollen: Secondary | ICD-10-CM | POA: Diagnosis not present

## 2023-12-14 ENCOUNTER — Ambulatory Visit
Payer: Medicare PPO | Attending: Student in an Organized Health Care Education/Training Program | Admitting: Student in an Organized Health Care Education/Training Program

## 2023-12-14 ENCOUNTER — Encounter: Payer: Self-pay | Admitting: Student in an Organized Health Care Education/Training Program

## 2023-12-14 VITALS — BP 158/81 | HR 89 | Temp 97.3°F | Resp 16 | Ht 65.0 in | Wt 188.0 lb

## 2023-12-14 DIAGNOSIS — Z981 Arthrodesis status: Secondary | ICD-10-CM | POA: Diagnosis not present

## 2023-12-14 DIAGNOSIS — M792 Neuralgia and neuritis, unspecified: Secondary | ICD-10-CM | POA: Diagnosis not present

## 2023-12-14 DIAGNOSIS — M5412 Radiculopathy, cervical region: Secondary | ICD-10-CM | POA: Insufficient documentation

## 2023-12-14 NOTE — Progress Notes (Signed)
PROVIDER NOTE: Information contained herein reflects review and annotations entered in association with encounter. Interpretation of such information and data should be left to medically-trained personnel. Information provided to patient can be located elsewhere in the medical record under "Patient Instructions". Document created using STT-dictation technology, any transcriptional errors that may result from process are unintentional.    Patient: Heather Beasley  Service Category: E/M  Provider: Edward Jolly, MD  DOB: [birth year]  DOS: 12/14/2023  Referring Provider: Mort Sawyers, FNP  MRN: [id number]  Specialty: Interventional Pain Management  PCP: Mort Sawyers, FNP  Type: Established Patient  Setting: Ambulatory outpatient    Location: Office  Delivery: Face-to-face     HPI  [name], a 68 y.o. year old female, is here today because of her Cervical radicular pain [M54.12]. Ms. Zellman primary complain today is Back Pain (Upper ) and Arm Pain (Right )  Pain Assessment: Severity of Chronic pain is reported as a 3 /10. Location: Back Upper, Medial/Pain in between shoulder blades and numbness and tingling going down right arm into the last 3 fingers (middle, ring, and pinky). When pain is bad can radaites from upper back into chest.. Onset: More than a month ago. Quality: Numbness, Dull, Sharp, Tingling. Timing: Constant. Modifying factor(s): Physical therapy, TENS Unit, heat, and ice. Vitals:  height is 5\' 5"  (1.651 m) and weight is 188 lb (85.3 kg). Her temporal temperature is 97.3 F (36.3 C) (abnormal). Her blood pressure is 158/81 (abnormal) and her pulse is 89. Her respiration is 16 and oxygen saturation is 98%.  BMI: Estimated body mass index is 31.28 kg/m as calculated from the following:   Height as of this encounter: 5\' 5"  (1.651 m).   Weight as of this encounter: 188 lb (85.3 kg). Last encounter: Visit date not found. Last procedure: Visit date not found.  Reason for  encounter: patient-requested evaluation.  Discussed the use of AI scribe software for clinical note transcription with the patient, who gave verbal consent to proceed.  History of Present Illness   SAARAH Beasley is a 68 year old female who presents with upper cervical thoracic pain. She was referred by Dr. Myer Haff for evaluation of C-ESI for cervical radicular pain going down right arm.  She experiences persistent pain in the upper cervical thoracic area, which began after undergoing low back surgery last year. This pain has been affecting her daily activities.  An MRI revealed a pinched nerve at the C6-C7 level, just below her previous fusion from C3 to C6. She describes numbness and tingling in three fingers of her arm, indicating radiation of symptoms down her arm.  She is currently attending physical therapy to manage her symptoms, which has provided some relief but not complete resolution of her pain.  She is taking Plavix but has received clearance to stop it prior to any procedures. She inquires about the need for a driver post-procedure and confirms she will need someone to drive her home.  Her low back, post-surgery, is reportedly doing well with no issues since the fusion in May of last year.        ROS  Constitutional: Denies any fever or chills Gastrointestinal: No reported hemesis, hematochezia, vomiting, or acute GI distress Musculoskeletal:  neck and right arm pain Neurological:  numbness of right hand  Medication Review  Budeson-Glycopyrrol-Formoterol, EPINEPHrine, Famotidine, Multiple Vitamins-Minerals, Vitamin D, acetaminophen, albuterol, atorvastatin, benzonatate, cetirizine, clopidogrel, cyclobenzaprine, furosemide, losartan, montelukast, oxymetazoline, pantoprazole, and predniSONE  History Review  Allergy: Ms. Kesling  is allergic to albumin (human), amoxicillin-pot clavulanate, gabapentin, latex, betadine [povidone iodine], sucralfate, amoxicillin, chantix  [varenicline tartrate], erythromycin, sucralfate, and tegaderm ag mesh [silver]. Drug: Ms. Malone  reports no history of drug use. Alcohol:  reports that she does not currently use alcohol. Tobacco:  reports that she quit smoking about a year ago. Her smoking use included cigarettes. She started smoking about 40 years ago. She has a 39 pack-year smoking history. She has never used smokeless tobacco. Social: Ms. Broaden  reports that she quit smoking about a year ago. Her smoking use included cigarettes. She started smoking about 40 years ago. She has a 39 pack-year smoking history. She has never used smokeless tobacco. She reports that she does not currently use alcohol. She reports that she does not use drugs. Medical:  has a past medical history of Allergic rhinitis, Aortic atherosclerosis (HCC), Arthritis, Asthma, Bilateral carotid artery disease (HCC), Cancer of nasal cavity and sinus (HCC) (07/07/2015), Cervical spondylosis, Colitis, Colon polyps, COPD (chronic obstructive pulmonary disease) (HCC), Coronary artery disease, DDD (degenerative disc disease), cervical, Depression, Diastolic dysfunction, Diverticulosis, GERD (gastroesophageal reflux disease), Glaucoma, Headaches, cluster, Heart murmur, HTN (hypertension), Hyperlipidemia, IBS (irritable bowel syndrome), Kidney stones, Long term current use of antithrombotics/antiplatelets, Lumbar adjacent segment disease with spondylolisthesis, Melanoma (HCC) (1995), Neuropathy, Neuropathy of left lower extremity, Nose colonized with MRSA (02/16/2023), PFO (patent foramen ovale), Pneumonia, PONV (postoperative nausea and vomiting), and Tobacco abuse. Surgical: Heather Beasley  has a past surgical history that includes Appendectomy (1998); Abdominal hysterectomy (1998); Lumbar spine surgery (07/2004); Vaginal delivery; Cervical fusion (2010); Cholecystectomy (08/2011); Esophagogastroduodenoscopy (2012); Colonoscopy (N/A); Tonsillectomy (1968); Cystoscopy (N/A); Back  surgery; Dilation and curettage of uterus; Fracture surgery (2001); Image guided sinus surgery (N/A, 06/25/2015); Polypectomy (N/A, 06/25/2015); Oophorectomy; CAROTID PTA/STENT INTERVENTION (Left, 06/27/2020); Cataract extraction; Anterior lumbar fusion (N/A, 03/01/2023); and Application of intraoperative CT scan (N/A, 03/01/2023). Family: family history includes Breast cancer (age of onset: 30) in her paternal grandmother; Cancer in her father and paternal grandmother; Colon cancer in her father; Coronary artery disease in her paternal grandmother; Diabetes in her mother and paternal grandmother; Heart failure in her paternal grandmother; Hyperlipidemia in her mother; Hypertension in her mother; Pancreatic cancer in her father; Stroke in her brother.  Laboratory Chemistry Profile   Renal Lab Results  Component Value Date   BUN 17 01/12/2023   CREATININE 1.04 (H) 01/12/2023   BCR 16 01/12/2023   GFR 74.49 11/24/2022   GFRAA >60 06/28/2020   GFRNONAA >60 12/31/2021    Hepatic Lab Results  Component Value Date   AST 17 04/14/2022   ALT 11 04/14/2022   ALBUMIN 4.1 04/14/2022   ALKPHOS 94 04/14/2022   LIPASE 16 01/04/2012    Electrolytes Lab Results  Component Value Date   NA 143 01/12/2023   K 5.1 01/12/2023   CL 103 01/12/2023   CALCIUM 9.7 01/12/2023    Bone Lab Results  Component Value Date   VD25OH 43.63 08/25/2023    Inflammation (CRP: Acute Phase) (ESR: Chronic Phase) Lab Results  Component Value Date   ESRSEDRATE 2 11/17/2012   LATICACIDVEN 1.0 06/11/2011         Note: Above Lab results reviewed.  Recent Imaging Review  MR CERVICAL SPINE WO CONTRAST CLINICAL DATA:  Posterior neck pain and right upper extremity radiculopathy  EXAM: MRI CERVICAL SPINE WITHOUT CONTRAST  TECHNIQUE: Multiplanar, multisequence MR imaging of the cervical spine was performed. No intravenous contrast was administered.  COMPARISON:  MRI 11/18/2012  FINDINGS: Alignment: Minimal  grade  1 anterolisthesis of C7 on T1.  Vertebrae: Prior C3-C6 ACDF with solid arthrodesis. No fracture. No evidence of discitis. No marrow replacing bone lesion.  Cord: Normal signal and morphology.  Posterior Fossa, vertebral arteries, paraspinal tissues: Negative.  Disc levels:  C2-C3: Shallow disc protrusion. Moderate left facet hypertrophy, progressed. No significant foraminal or canal stenosis.  C3-C4: Prior ACDF.  No residual foraminal or canal stenosis.  C4-C5: Prior ACDF.  No residual foraminal or canal stenosis.  C5-C6: Prior ACDF. Endplate and uncovertebral spurring contributes to mild residual bilateral foraminal stenosis. No canal stenosis. Findings are similar to prior.  C6-C7: Minimal disc osteophyte complex with bilateral facet arthropathy. No canal stenosis. Mild left foraminal stenosis. Findings have progressed from prior.  C7-T1: Minimal disc bulge with left worse than right facet arthropathy. Moderate left and mild right foraminal stenosis. No canal stenosis. Findings have progressed from prior.  IMPRESSION: 1. Prior C3-C6 ACDF with solid arthrodesis. No residual canal stenosis at the operative levels. 2. Mild residual bilateral foraminal stenosis at C5-6. 3. Mild left foraminal stenosis at C6-7. 4. Moderate left and mild right foraminal stenosis at C7-T1.  Electronically Signed   By: Duanne Guess D.O.   On: 11/21/2023 11:17 Note: Reviewed        Physical Exam  General appearance: Well nourished, well developed, and well hydrated. In no apparent acute distress Mental status: Alert, oriented x 3 (person, place, & time)       Respiratory: No evidence of acute respiratory distress Eyes: PERLA Vitals: BP (!) 158/81 (Cuff Size: Normal)   Pulse 89   Temp (!) 97.3 F (36.3 C) (Temporal)   Resp 16   Ht 5\' 5"  (1.651 m)   Wt 188 lb (85.3 kg)   SpO2 98%   BMI 31.28 kg/m  BMI: Estimated body mass index is 31.28 kg/m as calculated from the following:    Height as of this encounter: 5\' 5"  (1.651 m).   Weight as of this encounter: 188 lb (85.3 kg). Ideal: Ideal body weight: 57 kg (125 lb 10.6 oz) Adjusted ideal body weight: 68.3 kg (150 lb 9.6 oz)  Cervical Spine Area Exam  Skin & Axial Inspection: Well healed scar from previous spine surgery detected Alignment: Symmetrical Functional ROM: Decreased ROM      Stability: No instability detected Muscle Tone/Strength: Functionally intact. No obvious neuro-muscular anomalies detected. Sensory (Neurological): Dermatomal pain pattern right c6 Palpation: No palpable anomalies             Upper Extremity (UE) Exam    Side: Right upper extremity  Side: Left upper extremity  Skin & Extremity Inspection: Skin color, temperature, and hair growth are WNL. No peripheral edema or cyanosis. No masses, redness, swelling, asymmetry, or associated skin lesions. No contractures.  Skin & Extremity Inspection: Skin color, temperature, and hair growth are WNL. No peripheral edema or cyanosis. No masses, redness, swelling, asymmetry, or associated skin lesions. No contractures.  Functional ROM: Pain restricted ROM          Functional ROM: Unrestricted ROM          Muscle Tone/Strength: Functionally intact. No obvious neuro-muscular anomalies detected.  Muscle Tone/Strength: Functionally intact. No obvious neuro-muscular anomalies detected.  Sensory (Neurological): Dermatomal pain pattern          Sensory (Neurological): Unimpaired          Palpation: No palpable anomalies              Palpation: No palpable anomalies  Provocative Test(s):  Phalen's test: deferred Tinel's test: deferred Apley's scratch test (touch opposite shoulder):  Action 1 (Across chest): deferred Action 2 (Overhead): deferred Action 3 (LB reach): deferred   Provocative Test(s):  Phalen's test: deferred Tinel's test: deferred Apley's scratch test (touch opposite shoulder):  Action 1 (Across chest): deferred Action 2 (Overhead):  deferred Action 3 (LB reach): deferred     Assessment   Diagnosis Status  1. Cervical radicular pain   2. S/P cervical spinal fusion (c3-6)   3. Radicular pain in right arm    Having a Flare-up Controlled Having a Flare-up   Updated Problems: Problem  Radicular Pain in Right Arm  S/P cervical spinal fusion (c3-6)    Plan of Care   Cervical Radiculopathy Presents with upper cervical thoracic pain radiating down the arm, accompanied by numbness and tingling in three fingers. MRI reveals nerve compression at C6-C7, below a previous fusion from C3 to C6. Symptoms align with cervical radiculopathy. Discussed cervical epidural injection. Instructed to stop Plavix 7 days prior, substitute with 81 mg aspirin, use Valium for relaxation, and arrange for a driver post-procedure. Schedule the cervical epidural injection for next month, pending approval.    Ms. MERRIT WAUGH has a current medication list which includes the following long-term medication(s): albuterol, atorvastatin, furosemide, losartan, montelukast, and pantoprazole.  Pharmacotherapy (Medications Ordered): No orders of the defined types were placed in this encounter.  Orders:  Orders Placed This Encounter  Procedures   Cervical Epidural Injection    Sedation: Valium Purpose: Diagnostic/Therapeutic Indication(s): Radiculitis and cervicalgia associater with cervical degenerative disc disease.    Standing Status:   Future    Expected Date:   01/05/2024    Expiration Date:   03/12/2024    Scheduling Instructions:     Procedure: Cervical Epidural Steroid Injection/Block     Level(s): C7-T1     Laterality: TBD     Timeframe: As soon as schedule allows     Stop Plavix 7 days prior    Where will this procedure be performed?:   [facility]             Latasha Puskas   Follow-up plan:   Return in about 3 weeks (around 01/04/2024) for Right C-ESI, in clinic (PO Valium 5mg ) stop Plavix 7 days prior.      Recent  Visits No visits were found meeting these conditions. Showing recent visits within past 90 days and meeting all other requirements Today's Visits Date Type Provider Dept  12/14/23 Office Visit Edward Jolly, MD [facility]  Showing today's visits and meeting all other requirements Future Appointments No visits were found meeting these conditions. Showing future appointments within next 90 days and meeting all other requirements  I discussed the assessment and treatment plan with the patient. The patient was provided an opportunity to ask questions and all were answered. The patient agreed with the plan and demonstrated an understanding of the instructions.  Patient advised to call back or seek an in-person evaluation if the symptoms or condition worsens.  Duration of encounter: .  Total time on encounter, as per AMA guidelines included both the face-to-face and non-face-to-face time personally spent by the physician and/or other qualified health care professional(s) on the day of the encounter (includes time in activities that require the physician or other qualified health care professional and does not include time in activities normally performed by clinical staff). Physician's time may include the following activities when performed: Preparing to see the  patient (e.g., pre-charting review of records, searching for previously ordered imaging, lab work, and nerve conduction tests) Review of prior analgesic pharmacotherapies. Reviewing PMP Interpreting ordered tests (e.g., lab work, imaging, nerve conduction tests) Performing post-procedure evaluations, including interpretation of diagnostic procedures Obtaining and/or reviewing separately obtained history Performing a medically appropriate examination and/or evaluation Counseling and educating the patient/family/caregiver Ordering medications, tests, or procedures Referring and communicating with other health care  professionals (when not separately reported) Documenting clinical information in the electronic or other health record Independently interpreting results (not separately reported) and communicating results to the patient/ family/caregiver Care coordination (not separately reported)  Note by: Edward Jolly, MD Date: 12/14/2023; Time: 9:55 AM

## 2023-12-14 NOTE — Progress Notes (Signed)
 Safety precautions to be maintained throughout the outpatient stay will include: orient to surroundings, keep bed in low position, maintain call bell within reach at all times, provide assistance with transfer out of bed and ambulation.

## 2023-12-14 NOTE — Patient Instructions (Signed)
Stop Plavix 7 days prior to procedure.   GENERAL RISKS AND COMPLICATIONS  What are the risk, side effects and possible complications? Generally speaking, most procedures are safe.  However, with any procedure there are risks, side effects, and the possibility of complications.  The risks and complications are dependent upon the sites that are lesioned, or the type of nerve block to be performed.  The closer the procedure is to the spine, the more serious the risks are.  Great care is taken when placing the radio frequency needles, block needles or lesioning probes, but sometimes complications can occur. Infection: Any time there is an injection through the skin, there is a risk of infection.  This is why sterile conditions are used for these blocks.  There are four possible types of infection. Localized skin infection. Central Nervous System Infection-This can be in the form of Meningitis, which can be deadly. Epidural Infections-This can be in the form of an epidural abscess, which can cause pressure inside of the spine, causing compression of the spinal cord with subsequent paralysis. This would require an emergency surgery to decompress, and there are no guarantees that the patient would recover from the paralysis. Discitis-This is an infection of the intervertebral discs.  It occurs in about 1% of discography procedures.  It is difficult to treat and it may lead to surgery.        2. Pain: the needles have to go through skin and soft tissues, will cause soreness.       3. Damage to internal structures:  The nerves to be lesioned may be near blood vessels or    other nerves which can be potentially damaged.       4. Bleeding: Bleeding is more common if the patient is taking blood thinners such as  aspirin, Coumadin, Ticiid, Plavix, etc., or if he/she have some genetic predisposition  such as hemophilia. Bleeding into the spinal canal can cause compression of the spinal  cord with subsequent  paralysis.  This would require an emergency surgery to  decompress and there are no guarantees that the patient would recover from the  paralysis.       5. Pneumothorax:  Puncturing of a lung is a possibility, every time a needle is introduced in  the area of the chest or upper back.  Pneumothorax refers to free air around the  collapsed lung(s), inside of the thoracic cavity (chest cavity).  Another two possible  complications related to a similar event would include: Hemothorax and Chylothorax.   These are variations of the Pneumothorax, where instead of air around the collapsed  lung(s), you may have blood or chyle, respectively.       6. Spinal headaches: They may occur with any procedures in the area of the spine.       7. Persistent CSF (Cerebro-Spinal Fluid) leakage: This is a rare problem, but may occur  with prolonged intrathecal or epidural catheters either due to the formation of a fistulous  track or a dural tear.       8. Nerve damage: By working so close to the spinal cord, there is always a possibility of  nerve damage, which could be as serious as a permanent spinal cord injury with  paralysis.       9. Death:  Although rare, severe deadly allergic reactions known as "Anaphylactic  reaction" can occur to any of the medications used.      10. Worsening of the symptoms:  We can  always make thing worse.  What are the chances of something like this happening? Chances of any of this occuring are extremely low.  By statistics, you have more of a chance of getting killed in a motor vehicle accident: while driving to the hospital than any of the above occurring .  Nevertheless, you should be aware that they are possibilities.  In general, it is similar to taking a shower.  Everybody knows that you can slip, hit your head and get killed.  Does that mean that you should not shower again?  Nevertheless always keep in mind that statistics do not mean anything if you happen to be on the wrong side of  them.  Even if a procedure has a 1 (one) in a 1,000,000 (million) chance of going wrong, it you happen to be that one..Also, keep in mind that by statistics, you have more of a chance of having something go wrong when taking medications.  Who should not have this procedure? If you are on a blood thinning medication (e.g. Coumadin, Plavix, see list of "Blood Thinners"), or if you have an active infection going on, you should not have the procedure.  If you are taking any blood thinners, please inform your physician.  How should I prepare for this procedure? Do not eat or drink anything at least six hours prior to the procedure. Bring a driver with you .  It cannot be a taxi. Come accompanied by an adult that can drive you back, and that is strong enough to help you if your legs get weak or numb from the local anesthetic. Take all of your medicines the morning of the procedure with just enough water to swallow them. If you have diabetes, make sure that you are scheduled to have your procedure done first thing in the morning, whenever possible. If you have diabetes, take only half of your insulin dose and notify our nurse that you have done so as soon as you arrive at the clinic. If you are diabetic, but only take blood sugar pills (oral hypoglycemic), then do not take them on the morning of your procedure.  You may take them after you have had the procedure. Do not take aspirin or any aspirin-containing medications, at least eleven (11) days prior to the procedure.  They may prolong bleeding. Wear loose fitting clothing that may be easy to take off and that you would not mind if it got stained with Betadine or blood. Do not wear any jewelry or perfume Remove any nail coloring.  It will interfere with some of our monitoring equipment.  NOTE: Remember that this is not meant to be interpreted as a complete list of all possible complications.  Unforeseen problems may occur.  BLOOD THINNERS The  following drugs contain aspirin or other products, which can cause increased bleeding during surgery and should not be taken for 2 weeks prior to and 1 week after surgery.  If you should need take something for relief of minor pain, you may take acetaminophen which is found in Tylenol,m Datril, Anacin-3 and Panadol. It is not blood thinner. The products listed below are.  Do not take any of the products listed below in addition to any listed on your instruction sheet.  A.P.C or A.P.C with Codeine Codeine Phosphate Capsules #3 Ibuprofen Ridaura  ABC compound Congesprin Imuran rimadil  Advil Cope Indocin Robaxisal  Alka-Seltzer Effervescent Pain Reliever and Antacid Coricidin or Coricidin-D  Indomethacin Rufen  Alka-Seltzer plus Cold Medicine Cosprin Ketoprofen  S-A-C Tablets  Anacin Analgesic Tablets or Capsules Coumadin Korlgesic Salflex  Anacin Extra Strength Analgesic tablets or capsules CP-2 Tablets Lanoril Salicylate  Anaprox Cuprimine Capsules Levenox Salocol  Anexsia-D Dalteparin Magan Salsalate  Anodynos Darvon compound Magnesium Salicylate Sine-off  Ansaid Dasin Capsules Magsal Sodium Salicylate  Anturane Depen Capsules Marnal Soma  APF Arthritis pain formula Dewitt's Pills Measurin Stanback  Argesic Dia-Gesic Meclofenamic Sulfinpyrazone  Arthritis Bayer Timed Release Aspirin Diclofenac Meclomen Sulindac  Arthritis pain formula Anacin Dicumarol Medipren Supac  Analgesic (Safety coated) Arthralgen Diffunasal Mefanamic Suprofen  Arthritis Strength Bufferin Dihydrocodeine Mepro Compound Suprol  Arthropan liquid Dopirydamole Methcarbomol with Aspirin Synalgos  ASA tablets/Enseals Disalcid Micrainin Tagament  Ascriptin Doan's Midol Talwin  Ascriptin A/D Dolene Mobidin Tanderil  Ascriptin Extra Strength Dolobid Moblgesic Ticlid  Ascriptin with Codeine Doloprin or Doloprin with Codeine Momentum Tolectin  Asperbuf Duoprin Mono-gesic Trendar  Aspergum Duradyne Motrin or Motrin IB Triminicin   Aspirin plain, buffered or enteric coated Durasal Myochrisine Trigesic  Aspirin Suppositories Easprin Nalfon Trillsate  Aspirin with Codeine Ecotrin Regular or Extra Strength Naprosyn Uracel  Atromid-S Efficin Naproxen Ursinus  Auranofin Capsules Elmiron Neocylate Vanquish  Axotal Emagrin Norgesic Verin  Azathioprine Empirin or Empirin with Codeine Normiflo Vitamin E  Azolid Emprazil Nuprin Voltaren  Bayer Aspirin plain, buffered or children's or timed BC Tablets or powders Encaprin Orgaran Warfarin Sodium  Buff-a-Comp Enoxaparin Orudis Zorpin  Buff-a-Comp with Codeine Equegesic Os-Cal-Gesic   Buffaprin Excedrin plain, buffered or Extra Strength Oxalid   Bufferin Arthritis Strength Feldene Oxphenbutazone   Bufferin plain or Extra Strength Feldene Capsules Oxycodone with Aspirin   Bufferin with Codeine Fenoprofen Fenoprofen Pabalate or Pabalate-SF   Buffets II Flogesic Panagesic   Buffinol plain or Extra Strength Florinal or Florinal with Codeine Panwarfarin   Buf-Tabs Flurbiprofen Penicillamine   Butalbital Compound Four-way cold tablets Penicillin   Butazolidin Fragmin Pepto-Bismol   Carbenicillin Geminisyn Percodan   Carna Arthritis Reliever Geopen Persantine   Carprofen Gold's salt Persistin   Chloramphenicol Goody's Phenylbutazone   Chloromycetin Haltrain Piroxlcam   Clmetidine heparin Plaquenil   Cllnoril Hyco-pap Ponstel   Clofibrate Hydroxy chloroquine Propoxyphen         Before stopping any of these medications, be sure to consult the physician who ordered them.  Some, such as Coumadin (Warfarin) are ordered to prevent or treat serious conditions such as "deep thrombosis", "pumonary embolisms", and other heart problems.  The amount of time that you may need off of the medication may also vary with the medication and the reason for which you were taking it.  If you are taking any of these medications, please make sure you notify your pain physician before you undergo any  procedures.         Moderate Conscious Sedation, Adult Sedation is the use of medicines to help you relax and not feel pain. Moderate conscious sedation is a type of sedation that makes you less alert than normal. You are still able to respond to instructions, touch, or both. This type of sedation is used during short medical and dental procedures. It is milder than deep sedation, which is a type of sedation you cannot be easily woken up from. It is also milder than general anesthesia, which is the use of medicines to make you fall asleep. Moderate conscious sedation lets you return to your normal activities sooner. Tell a health care provider about: Any allergies you have. All medicines you are taking, including vitamins, herbs, steroids, eye drops, creams, and over-the-counter medicines. Any  problems you or family members have had with anesthesia. Any bleeding problems you have. Any surgeries you have had. Any medical conditions you have. Whether you are pregnant or may be pregnant. Any recent alcohol, tobacco, or drug use. What are the risks? Your health care provider will talk with you about risks. These may include: Oversedation. This is when you get too much medicine. Nausea or vomiting. Allergic reaction to medicines. Trouble breathing. If this happens, a breathing tube may be used. It will be removed when you can breathe better on your own. Heart trouble. Lung trouble. Emergence delirium. This is when you feel confused while the sedation wears off. This gets better with time. What happens before the procedure? When to stop eating and drinking Follow instructions from your health care provider about what you may eat and drink. These may include: 8 hours before your procedure Stop eating most foods. Do not eat meat, fried foods, or fatty foods. Eat only light foods, such as toast or crackers. All liquids are okay except energy drinks and alcohol. 6 hours before your  procedure Stop eating. Drink only clear liquids, such as water, clear fruit juice, black coffee, plain tea, and sports drinks. Do not drink energy drinks or alcohol. 2 hours before your procedure Stop drinking all liquids. You may be allowed to take medicines with small sips of water. If you do not follow your health care provider's instructions, your procedure may be delayed or canceled. Medicines Ask your health care provider about: Changing or stopping your regular medicines. These include any diabetes medicines or blood thinners you take. Taking medicines such as aspirin and ibuprofen. These medicines can thin your blood. Do not take them unless your health care provider tells you to. Taking over-the-counter medicines, vitamins, herbs, and supplements. Tests and exams You may have an exam or testing. You may have a blood or urine sample taken. General instructions Do not use any products that contain nicotine or tobacco for at least 4 weeks before the procedure. These products include cigarettes, chewing tobacco, and vaping devices, such as e-cigarettes. If you need help quitting, ask your health care provider. If you will be going home right after the procedure, plan to have a responsible adult: Take you home from the hospital or clinic. You will not be allowed to drive. Care for you for the time you are told. What happens during the procedure?  You will be given the sedative. It may be given: As a pill you can take by mouth. It can also be put into the rectum. As a spray through the nose. As an injection into muscle. As an injection into a vein through an IV. You may be given oxygen as needed. Your blood pressure, heart rate, breathing rate, and blood oxygen level will be monitored during the procedure. The medical or dental procedure will be done. The procedure may vary among health care providers and hospitals. What happens after the procedure? Your blood pressure, heart  rate, breathing rate, and blood oxygen level will be monitored until you leave the hospital or clinic. You will get fluids through an IV as needed. Do not drive or operate machinery until your health care provider says that it is safe. This information is not intended to replace advice given to you by your health care provider. Make sure you discuss any questions you have with your health care provider. Document Revised: 04/27/2022 Document Reviewed: 04/27/2022 Elsevier Patient Education  2024 [employer]Epidural Steroid Injection Patient Information  Description: The epidural space surrounds the nerves as they exit the spinal cord.  In some patients, the nerves can be compressed and inflamed by a bulging disc or a tight spinal canal (spinal stenosis).  By injecting steroids into the epidural space, we can bring irritated nerves into direct contact with a potentially helpful medication.  These steroids act directly on the irritated nerves and can reduce swelling and inflammation which often leads to decreased pain.  Epidural steroids may be injected anywhere along the spine and from the neck to the low back depending upon the location of your pain.   After numbing the skin with local anesthetic (like Novocaine), a small needle is passed into the epidural space slowly.  You may experience a sensation of pressure while this is being done.  The entire block usually last less than 10 minutes.  Conditions which may be treated by epidural steroids:  Low back and leg pain Neck and arm pain Spinal stenosis Post-laminectomy syndrome Herpes zoster (shingles) pain Pain from compression fractures  Preparation for the injection:  Do not eat any solid food or dairy products within 8 hours of your appointment.  You may drink clear liquids up to 3 hours before appointment.  Clear liquids include water, black coffee, juice or soda.  No milk or cream please. You may take your regular medication, including  pain medications, with a sip of water before your appointment  Diabetics should hold regular insulin (if taken separately) and take 1/2 normal NPH dos the morning of the procedure.  Carry some sugar containing items with you to your appointment. A driver must accompany you and be prepared to drive you home after your procedure.  Bring all your current medications with your. An IV may be inserted and sedation may be given at the discretion of the physician.   A blood pressure cuff, EKG and other monitors will often be applied during the procedure.  Some patients may need to have extra oxygen administered for a short period. You will be asked to provide medical information, including your allergies, prior to the procedure.  We must know immediately if you are taking blood thinners (like Coumadin/Warfarin)  Or if you are allergic to IV iodine contrast (dye). We must know if you could possible be pregnant.  Possible side-effects: Bleeding from needle site Infection (rare, may require surgery) Nerve injury (rare) Numbness & tingling (temporary) Difficulty urinating (rare, temporary) Spinal headache ( a headache worse with upright posture) Light -headedness (temporary) Pain at injection site (several days) Decreased blood pressure (temporary) Weakness in arm/leg (temporary) Pressure sensation in back/neck (temporary)  Call if you experience: Fever/chills associated with headache or increased back/neck pain. Headache worsened by an upright position. New onset weakness or numbness of an extremity below the injection site Hives or difficulty breathing (go to the emergency room) Inflammation or drainage at the infection site Severe back/neck pain Any new symptoms which are concerning to you  Please note:  Although the local anesthetic injected can often make your back or neck feel good for several hours after the injection, the pain will likely return.  It takes 3-7 days for steroids to work in  the epidural space.  You may not notice any pain relief for at least that one week.  If effective, we will often do a series of three injections spaced 3-6 weeks apart to maximally decrease your pain.  After the initial series, we generally will wait several months before considering a repeat injection of  the same type.  If you have any questions, please call [phone] [facility]

## 2023-12-20 ENCOUNTER — Ambulatory Visit (INDEPENDENT_AMBULATORY_CARE_PROVIDER_SITE_OTHER): Payer: Medicare PPO

## 2023-12-20 ENCOUNTER — Encounter: Payer: Self-pay | Admitting: Podiatry

## 2023-12-20 ENCOUNTER — Ambulatory Visit: Payer: Medicare PPO | Admitting: Podiatry

## 2023-12-20 DIAGNOSIS — M722 Plantar fascial fibromatosis: Secondary | ICD-10-CM | POA: Diagnosis not present

## 2023-12-20 DIAGNOSIS — L603 Nail dystrophy: Secondary | ICD-10-CM | POA: Diagnosis not present

## 2023-12-20 DIAGNOSIS — B351 Tinea unguium: Secondary | ICD-10-CM | POA: Diagnosis not present

## 2023-12-20 DIAGNOSIS — J301 Allergic rhinitis due to pollen: Secondary | ICD-10-CM | POA: Diagnosis not present

## 2023-12-20 MED ORDER — TRIAMCINOLONE ACETONIDE 40 MG/ML IJ SUSP
20.0000 mg | Freq: Once | INTRAMUSCULAR | Status: AC
  Administered 2023-12-20: 20 mg

## 2023-12-20 NOTE — Progress Notes (Signed)
 Subjective:  Patient ID: Heather Beasley, female    DOB: Jun 03, 1956,  MRN: [id number] HPI Chief Complaint  Patient presents with   Foot Pain    Plantar heel left - aching x 2-3 months, AM pain, worsened the past 1-2 weeks, feels a knot   Nail Problem    Hallux right - toenail thick and white, tried OTC meds-no help   New Patient (Initial Visit)    Est pt 20 years ago    68 y.o. female presents with the above complaint.   ROS: Denies fever chills nausea vomiting muscle aches pains calf pain back pain chest pain shortness of breath.  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; T1 N0 M0; s/p adjuvant XRT; did not require systemic chemotherapy   Cervical spondylosis    Colitis    Colon polyps    COPD (chronic obstructive pulmonary disease) (HCC)    Coronary artery disease    a.) cCTA 01/26/2022: Ca score 80.3 (82nd percentile for age/sex match control)   DDD (degenerative disc disease), cervical    a.) ACDF C3-C6   Depression    Diastolic dysfunction    a.) TTE 06/30/2013: EF 55-6%, mild LAE, mild MR, G1DD; b.) TTE 01/21/2022: EF 70-75%, mod LVH, mild MR, G1DD   Diverticulosis    GERD (gastroesophageal reflux disease)    Glaucoma    she reports that she does not have glacoma, she has thick corneas   Headaches, cluster    Heart murmur    HTN (hypertension)    Hyperlipidemia    IBS (irritable bowel syndrome)    Kidney stones    Long term current use of antithrombotics/antiplatelets    a.) clopidogrel   Lumbar adjacent segment disease with  spondylolisthesis    Melanoma (HCC) 1995   above the L knee    Neuropathy    Neuropathy of left lower extremity    Nose colonized with MRSA 02/16/2023   a.) surgical PCR (+) 02/16/2023 prior to L3-4 LATERAL LUMBAR INTERBODY FUSION; L3-S1 POSTERIOR SPINAL FUSION   PFO (patent foramen ovale)    a.) cCTA 01/26/2022 --> PFO with L to R contrast flow   Pneumonia    PONV (postoperative nausea and vomiting)    2012 - during lumbar fusion she developed spinal fluid leak, and had to have 3 units on Blood, BP dropped, and in ICU,   Tobacco abuse    Past Surgical History:  Procedure Laterality Date   ABDOMINAL HYSTERECTOMY  1998   ANTERIOR LUMBAR FUSION N/A 03/01/2023   Procedure: L3-4 LATERAL LUMBAR INTERBODY FUSION;  Surgeon: Venetia Night, MD;  Location: [facility];  Service: Neurosurgery;  Laterality: N/A;   APPENDECTOMY  1998   APPLICATION OF INTRAOPERATIVE CT SCAN N/A 03/01/2023   Procedure: APPLICATION OF INTRAOPERATIVE CT SCAN;  Surgeon: Venetia Night, MD;  Location: [facility];  Service: Neurosurgery;  Laterality: N/A;   BACK SURGERY     CAROTID PTA/STENT INTERVENTION Left 06/27/2020   Procedure: CAROTID PTA/STENT INTERVENTION;  Surgeon: Annice Needy, MD;  Location: [facility];  Service: Cardiovascular;  Laterality: Left;   CATARACT EXTRACTION     CERVICAL FUSION  2010   C3-C6   CHOLECYSTECTOMY  08/2011   Procedure: Cholecystectomy; Location: [facility]; Surgeon: Renda Rolls, MD   COLONOSCOPY N/A    Procedure: COLONOSCOPY; Location: [facility]; Surgeon: Lynnae Prude, MD   CYSTOSCOPY N/A    Procedure: CYSTOSCOPY; Location: [facility]; Surgeon: Irineo Axon, MD   DILATION AND CURETTAGE OF UTERUS     ESOPHAGOGASTRODUODENOSCOPY  2012   Procedure: ESOPHAGOGASTRODUODENOSCOPY; Location: [facility]; Surgeon: Lynnae Prude, MD   FRACTURE SURGERY  2001   plate in right wrist and arm   IMAGE GUIDED SINUS SURGERY N/A 06/25/2015   Procedure: IMAGE GUIDED SINUS SURGERY;  Surgeon: Linus Salmons,  MD;  Location: [facility];  Service: ENT;  Laterality: N/A;   LUMBAR SPINE SURGERY  07/2004   OOPHORECTOMY     POLYPECTOMY N/A 06/25/2015   Procedure: POLYPECTOMY NASAL;  Surgeon: Linus Salmons, MD;  Location: [facility];  Service: ENT;  Laterality: N/A;   TONSILLECTOMY  1968   VAGINAL DELIVERY     x2    Current Outpatient Medications:    acetaminophen (TYLENOL) 650 MG CR tablet, Take 650 mg by mouth every 8 (eight) hours as needed for pain., Disp: , Rfl:    albuterol (VENTOLIN HFA) 108 (90 Base) MCG/ACT inhaler, Inhale 1-2 puffs into the lungs every 6 (six) hours as needed (for cough)., Disp: 18 g, Rfl: 2   atorvastatin (LIPITOR) 20 MG tablet, TAKE ONE TABLET BY MOUTH ONCE A DAY, Disp: 90 tablet, Rfl: 3   benzonatate (TESSALON) 200 MG capsule, Take 200 mg by mouth 3 (three) times daily as needed for cough., Disp: , Rfl:    Budeson-Glycopyrrol-Formoterol (BREZTRI AEROSPHERE) 160-9-4.8 MCG/ACT AERO, Inhale 2 puffs into the lungs in the morning and at bedtime., Disp: 11.8 g, Rfl: 0   cetirizine (ZYRTEC) 10 MG tablet, Take 10 mg by mouth daily. Allergies., Disp: , Rfl:    Cholecalciferol (VITAMIN D) 50 MCG (2000 UT) CAPS, Take 2,000 Units by mouth., Disp: , Rfl:    clopidogrel (PLAVIX) 75 MG tablet, Take 1 tablet (75 mg total) by mouth daily., Disp: 90 tablet, Rfl: 3   cyclobenzaprine (FLEXERIL) 10 MG tablet, Take 1 tablet (10 mg total) by mouth 3 (three) times daily as needed for muscle spasms., Disp: 90 tablet, Rfl: 0   EPINEPHrine 0.3 mg/0.3 mL IJ SOAJ injection, Inject 1 mg into the skin as needed., Disp: , Rfl:    Famotidine (PEPCID PO), Take 1 tablet by mouth at bedtime., Disp: , Rfl:    furosemide (LASIX) 20 MG tablet, Take 1 tablet (20 mg total) by mouth as needed for fluid., Disp: 30 tablet, Rfl: 0   losartan (COZAAR) 50 MG tablet, Take 1 tablet (50 mg total) by mouth daily., Disp: 90 tablet, Rfl: 1   montelukast (SINGULAIR) 10 MG tablet, TAKE ONE TABLET BY MOUTH AT BEDTIME, Disp: 90  tablet, Rfl: 3   Multiple Vitamins-Minerals (CENTRUM ADULTS PO), Take 1 tablet by mouth., Disp: , Rfl:    oxymetazoline (AFRIN) 0.05 % nasal spray, Place 1 spray into both nostrils 2 (two) times daily as needed for congestion. Use for nose bleeds, Disp: , Rfl:    pantoprazole (PROTONIX) 40 MG tablet, Take 1 tablet (40 mg total) by mouth daily., Disp: , Rfl:    predniSONE (DELTASONE) 20 MG tablet, Take two tablets once daily for five days, Disp: 10 tablet, Rfl: 0  Allergies  Allergen Reactions   Albumin (  Human) Rash   Amoxicillin-Pot Clavulanate Hives   Gabapentin Shortness Of Breath   Latex Dermatitis and Rash   Betadine [Povidone Iodine] Dermatitis, Rash and Other (See Comments)    Skin burning Topical betadine and iodine have this reaction with patient.  IV contrast is NOT a problem.  jkl   Sucralfate     Other reaction(s): Other (See Comments) Abdominal Pain   Amoxicillin     REACTION: rash   Chantix [Varenicline Tartrate]     Heart racing   Erythromycin     REACTION: rash   Sucralfate Nausea And Vomiting    Heart racing Lightheaded    Tegaderm Ag Mesh [Silver] Hives    Mild    Review of Systems Objective:  There were no vitals filed for this visit.  General: Well developed, nourished, in no acute distress, alert and oriented x3   Dermatological: Skin is warm, dry and supple bilateral. Nails x 10 are well maintained; remaining integument appears unremarkable at this time. There are no open sores, no preulcerative lesions, no rash or signs of infection present.  Thick hallux nail plate right with subungual debris brittle and friable with powder type debris  Vascular: Dorsalis Pedis artery and Posterior Tibial artery pedal pulses are 2/4 bilateral with immedate capillary fill time. Pedal hair growth present. No varicosities and no lower extremity edema present bilateral.   Neruologic: Grossly intact via light touch bilateral. Vibratory intact via tuning fork bilateral.  Protective threshold with Semmes Wienstein monofilament intact to all pedal sites bilateral. Patellar and Achilles deep tendon reflexes 2+ bilateral. No Babinski or clonus noted bilateral.   Musculoskeletal: No gross boney pedal deformities bilateral. No pain, crepitus, or limitation noted with foot and ankle range of motion bilateral. Muscular strength 5/5 in all groups tested bilateral.  Pain on palpation medial calcaneal tubercle of the left heel.  Gait: Unassisted, Nonantalgic.    Radiographs:  Radiographs taken today demonstrate area in question soft tissue increase in density of plantar fascial kidney insertion site large retrocalcaneal heel spur noncomplicated no acute findings identified.  Assessment & Plan:   Assessment: Planter fasciitis left with nail dystrophy hallux right.  Plan: Discussed etiology pathology conservative surgical therapies I injected the left heel today 20 mg Kenalog 5 mg Marcaine point of maximal tenderness.  I took samples of the skin and nail to be sent for pathologic evaluation from the right hallux I will follow-up with her in 1 month for recheck on both     Lennon Boutwell T. [place], [place]

## 2023-12-20 NOTE — Patient Instructions (Signed)

## 2023-12-21 DIAGNOSIS — M542 Cervicalgia: Secondary | ICD-10-CM | POA: Diagnosis not present

## 2023-12-27 ENCOUNTER — Telehealth: Payer: Self-pay

## 2023-12-27 ENCOUNTER — Encounter: Payer: Self-pay | Admitting: Family

## 2023-12-27 DIAGNOSIS — Z20828 Contact with and (suspected) exposure to other viral communicable diseases: Secondary | ICD-10-CM

## 2023-12-27 NOTE — Telephone Encounter (Signed)
 I scheduled her cesi for 01/24/24. It says stop plavix x 7days. Does she need clearance?

## 2023-12-27 NOTE — Telephone Encounter (Signed)
 No, I don't see that Dr. Cherylann Ratel ordered US to get clearance.

## 2023-12-28 MED ORDER — OSELTAMIVIR PHOSPHATE 75 MG PO CAPS: 75.0000 mg | ORAL_CAPSULE | Freq: Two times a day (BID) | ORAL | 0 refills | Status: AC

## 2023-12-31 ENCOUNTER — Ambulatory Visit (INDEPENDENT_AMBULATORY_CARE_PROVIDER_SITE_OTHER)
Admission: RE | Admit: 2023-12-31 | Discharge: 2023-12-31 | Disposition: A | Discharge status: Home / Self Care | Source: Ambulatory Visit | Attending: Family | Admitting: Family

## 2023-12-31 ENCOUNTER — Encounter: Payer: Self-pay | Admitting: Family

## 2023-12-31 ENCOUNTER — Encounter: Payer: Self-pay | Admitting: Neurosurgery

## 2023-12-31 ENCOUNTER — Other Ambulatory Visit: Payer: Self-pay | Admitting: Family

## 2023-12-31 ENCOUNTER — Ambulatory Visit: Admitting: Family

## 2023-12-31 VITALS — BP 130/86 | Temp 98.3°F | Ht 65.0 in | Wt 187.8 lb

## 2023-12-31 DIAGNOSIS — R062 Wheezing: Secondary | ICD-10-CM | POA: Diagnosis not present

## 2023-12-31 DIAGNOSIS — J22 Unspecified acute lower respiratory infection: Secondary | ICD-10-CM | POA: Diagnosis not present

## 2023-12-31 DIAGNOSIS — B9689 Other specified bacterial agents as the cause of diseases classified elsewhere: Secondary | ICD-10-CM

## 2023-12-31 DIAGNOSIS — J441 Chronic obstructive pulmonary disease with (acute) exacerbation: Secondary | ICD-10-CM | POA: Diagnosis not present

## 2023-12-31 DIAGNOSIS — R0609 Other forms of dyspnea: Secondary | ICD-10-CM

## 2023-12-31 DIAGNOSIS — B370 Candidal stomatitis: Secondary | ICD-10-CM | POA: Diagnosis not present

## 2023-12-31 MED ORDER — FLUCONAZOLE 150 MG PO TABS: 150.0000 mg | ORAL_TABLET | Freq: Once | ORAL | 0 refills | Status: DC

## 2023-12-31 MED ORDER — CLOTRIMAZOLE 10 MG MT TROC: 10.0000 mg | Freq: Every day | OROMUCOSAL | 0 refills | Status: AC

## 2023-12-31 MED ORDER — NYSTATIN 100000 UNIT/ML MT SUSP: 5.0000 mL | Freq: Four times a day (QID) | OROMUCOSAL | 0 refills | Status: DC

## 2023-12-31 MED ORDER — NYSTATIN 100000 UNIT/ML MT SUSP
5.0000 mL | Freq: Four times a day (QID) | OROMUCOSAL | 0 refills | Status: DC
Start: 2023-12-31 — End: 2023-12-31

## 2023-12-31 MED ORDER — PREDNISONE 10 MG (21) PO TBPK: ORAL_TABLET | ORAL | 0 refills | Status: DC

## 2023-12-31 MED ORDER — DOXYCYCLINE HYCLATE 100 MG PO TABS: 100.0000 mg | ORAL_TABLET | Freq: Two times a day (BID) | ORAL | 0 refills | Status: AC

## 2023-12-31 NOTE — Assessment & Plan Note (Signed)
 Albuterol prn  Stat cxr r/o pneumonia If + rx doxycycline and cefdinir and prednisone If - rx doxycycline and prednisone  Take antibiotic as prescribed. Increase oral fluids. Pt to f/u if sx worsen and or fail to improve in 2-3 days.

## 2023-12-31 NOTE — Assessment & Plan Note (Signed)
 Rx clotrimazole troche 5 x daily for 7 days Wash after every use of inhaler

## 2023-12-31 NOTE — Progress Notes (Signed)
 Established Patient Office Visit  Subjective:   Patient ID: Heather Beasley, female    DOB: 01/19/1956  Age: 68 y.o. MRN: [id number]  CC:  Chief Complaint  Patient presents with   Acute Visit    Flu positive - currently on Tamiflu but it's not helping. Coughing is her biggest complaint.    HPI: Heather Beasley is a 68 y.o. female presenting on 12/31/2023 for Acute Visit (Flu positive - currently on Tamiflu but it's not helping. Coughing is her biggest complaint.)  Five days ago started with fever that went up to 102.2 F, cough productive, and congestion. Last fever was two days ago yesterday with temp of 99.6 F. She states the cough has productive yellow thick sputum, she is more sob than usual. She is having worsening DOE. Her husband was diagnosed positive for flu,she was given tamiflu due to exposure and is on the fifth day.   She has emphysema  She is using her albuterol about three times daily at current.      ROS: Negative unless specifically indicated above in HPI.   Relevant past medical history reviewed and updated as indicated.   Allergies and medications reviewed and updated.   Current Outpatient Medications:    acetaminophen (TYLENOL) 650 MG CR tablet, Take 650 mg by mouth every 8 (eight) hours as needed for pain., Disp: , Rfl:    albuterol (VENTOLIN HFA) 108 (90 Base) MCG/ACT inhaler, Inhale 1-2 puffs into the lungs every 6 (six) hours as needed (for cough)., Disp: 18 g, Rfl: 2   atorvastatin (LIPITOR) 20 MG tablet, TAKE ONE TABLET BY MOUTH ONCE A DAY, Disp: 90 tablet, Rfl: 3   benzonatate (TESSALON) 200 MG capsule, Take 200 mg by mouth 3 (three) times daily as needed for cough., Disp: , Rfl:    Budeson-Glycopyrrol-Formoterol (BREZTRI AEROSPHERE) 160-9-4.8 MCG/ACT AERO, Inhale 2 puffs into the lungs in the morning and at bedtime., Disp: 11.8 g, Rfl: 0   cetirizine (ZYRTEC) 10 MG tablet, Take 10 mg by mouth daily. Allergies., Disp: , Rfl:    Cholecalciferol (VITAMIN  D) 50 MCG (2000 UT) CAPS, Take 2,000 Units by mouth., Disp: , Rfl:    clopidogrel (PLAVIX) 75 MG tablet, Take 1 tablet (75 mg total) by mouth daily., Disp: 90 tablet, Rfl: 3   clotrimazole (MYCELEX) 10 MG troche, Take 1 tablet (10 mg total) by mouth 5 (five) times daily for 7 days., Disp: 35 tablet, Rfl: 0   cyclobenzaprine (FLEXERIL) 10 MG tablet, Take 1 tablet (10 mg total) by mouth 3 (three) times daily as needed for muscle spasms., Disp: 90 tablet, Rfl: 0   EPINEPHrine 0.3 mg/0.3 mL IJ SOAJ injection, Inject 1 mg into the skin as needed., Disp: , Rfl:    Famotidine (PEPCID PO), Take 1 tablet by mouth at bedtime., Disp: , Rfl:    furosemide (LASIX) 20 MG tablet, Take 1 tablet (20 mg total) by mouth as needed for fluid., Disp: 30 tablet, Rfl: 0   losartan (COZAAR) 50 MG tablet, Take 1 tablet (50 mg total) by mouth daily., Disp: 90 tablet, Rfl: 1   montelukast (SINGULAIR) 10 MG tablet, TAKE ONE TABLET BY MOUTH AT BEDTIME, Disp: 90 tablet, Rfl: 3   Multiple Vitamins-Minerals (CENTRUM ADULTS PO), Take 1 tablet by mouth., Disp: , Rfl:    oseltamivir (TAMIFLU) 75 MG capsule, Take 1 capsule (75 mg total) by mouth 2 (two) times daily for 5 days., Disp: 10 capsule, Rfl: 0   oxymetazoline (AFRIN) 0.05 %  nasal spray, Place 1 spray into both nostrils 2 (two) times daily as needed for congestion. Use for nose bleeds, Disp: , Rfl:    pantoprazole (PROTONIX) 40 MG tablet, Take 1 tablet (40 mg total) by mouth daily., Disp: , Rfl:   Allergies  Allergen Reactions   Albumin (Human) Rash   Amoxicillin-Pot Clavulanate Hives   Gabapentin Shortness Of Breath   Latex Dermatitis and Rash   Betadine [Povidone Iodine] Dermatitis, Rash and Other (See Comments)    Skin burning Topical betadine and iodine have this reaction with patient.  IV contrast is NOT a problem.  jkl   Sucralfate     Other reaction(s): Other (See Comments) Abdominal Pain   Amoxicillin     REACTION: rash   Chantix [Varenicline Tartrate]      Heart racing   Erythromycin     REACTION: rash   Sucralfate Nausea And Vomiting    Heart racing Lightheaded    Tegaderm Ag Mesh [Silver] Hives    Mild     Objective:   BP 130/86 (BP Location: Left Arm, Patient Position: Sitting, Cuff Size: Normal)   Temp 98.3 F (36.8 C) (Temporal)   Ht 5\' 5"  (1.651 m)   Wt 187 lb 12.8 oz (85.2 kg)   BMI 31.25 kg/m    Physical Exam Constitutional:      General: She is not in acute distress.    Appearance: Normal appearance. She is normal weight. She is not ill-appearing, toxic-appearing or diaphoretic.  HENT:     Head: Normocephalic.     Right Ear: A middle ear effusion is present.     Left Ear: A middle ear effusion is present.     Nose: Nose normal.     Mouth/Throat:     Mouth: Mucous membranes are dry.     Tongue: Lesions (white budding on base of erythema) present.     Pharynx: Posterior oropharyngeal erythema present. No oropharyngeal exudate.  Eyes:     Extraocular Movements: Extraocular movements intact.     Pupils: Pupils are equal, round, and reactive to light.  Cardiovascular:     Rate and Rhythm: Normal rate and regular rhythm.     Pulses: Normal pulses.     Heart sounds: Normal heart sounds.  Pulmonary:     Effort: Pulmonary effort is normal.     Breath sounds: Normal breath sounds.  Musculoskeletal:     Cervical back: Normal range of motion.  Neurological:     General: No focal deficit present.     Mental Status: She is alert and oriented to person, place, and time. Mental status is at baseline.  Psychiatric:        Mood and Affect: Mood normal.        Behavior: Behavior normal.        Thought Content: Thought content normal.        Judgment: Judgment normal.     Assessment & Plan:  DOE (dyspnea on exertion) -     DG Chest 2 View; Future  Oral thrush Assessment & Plan: Rx clotrimazole troche 5 x daily for 7 days Wash after every use of inhaler  Orders: -     Clotrimazole; Take 1 tablet (10 mg total) by  mouth 5 (five) times daily for 7 days.  Dispense: 35 tablet; Refill: 0  COPD with acute exacerbation (HCC)  Acute lower respiratory infection Assessment & Plan: Albuterol prn  Stat cxr r/o pneumonia If + rx doxycycline and cefdinir and prednisone  If - rx doxycycline and prednisone  Take antibiotic as prescribed. Increase oral fluids. Pt to f/u if sx worsen and or fail to improve in 2-3 days.       Follow up plan: Return if symptoms worsen or fail to improve.  Mort Sawyers, FNP

## 2024-01-03 ENCOUNTER — Other Ambulatory Visit: Payer: Self-pay | Admitting: Podiatry

## 2024-01-03 DIAGNOSIS — J301 Allergic rhinitis due to pollen: Secondary | ICD-10-CM | POA: Diagnosis not present

## 2024-01-05 ENCOUNTER — Encounter: Payer: Self-pay | Admitting: Pulmonary Disease

## 2024-01-05 ENCOUNTER — Ambulatory Visit: Payer: Medicare PPO | Admitting: Pulmonary Disease

## 2024-01-05 VITALS — BP 138/82 | HR 88 | Temp 97.1°F | Ht 65.0 in | Wt 186.2 lb

## 2024-01-05 DIAGNOSIS — Z7289 Other problems related to lifestyle: Secondary | ICD-10-CM | POA: Diagnosis not present

## 2024-01-05 DIAGNOSIS — Z87891 Personal history of nicotine dependence: Secondary | ICD-10-CM | POA: Diagnosis not present

## 2024-01-05 DIAGNOSIS — J449 Chronic obstructive pulmonary disease, unspecified: Secondary | ICD-10-CM

## 2024-01-05 DIAGNOSIS — I517 Cardiomegaly: Secondary | ICD-10-CM | POA: Diagnosis not present

## 2024-01-05 MED ORDER — PREDNISONE 20 MG PO TABS
20.0000 mg | ORAL_TABLET | Freq: Every day | ORAL | 0 refills | Status: AC
Start: 2024-01-07 — End: 2024-01-12

## 2024-01-05 NOTE — Patient Instructions (Signed)
 VISIT SUMMARY:  During today's visit, we discussed your recent exacerbation of COPD, which was likely triggered by a viral infection. We also reviewed your ongoing flu-like symptoms, sinus pain, and your upcoming lung cancer screening.  YOUR PLAN:  -CHRONIC OBSTRUCTIVE PULMONARY DISEASE (COPD) EXACERBATION: COPD exacerbation means a worsening of your chronic lung condition, often triggered by infections. You have been taking prednisone and doxycycline, which have helped improve your symptoms. We will extend your prednisone treatment with an additional five tablets of 20 mg to ensure complete recovery. Continue using Breztri daily and albuterol as needed for acute symptoms. Please call if your symptoms do not improve.  -FLU-LIKE ILLNESS: You have been experiencing flu-like symptoms, which coincided with your COPD exacerbation. Your husband also had a similar illness and was hospitalized. Continue taking doxycycline as prescribed to cover any potential bacterial infection.  -ALLERGIC RHINITIS: Allergic rhinitis is inflammation of the nasal passages due to allergies, causing sinus pain and pressure. Continue taking Zyrtec daily to manage these symptoms.  -LUNG CANCER SCREENING: You have a lung cancer screening CT scheduled for January 13, 2024. This scan is important for early detection of lung cancer. Your recent infection should be resolved by the time of the scan, so please proceed as planned.  INSTRUCTIONS:  Please follow up if your symptoms do not improve or if you have any concerns. Your lung cancer screening CT is scheduled for January 13, 2024. Continue your current medications as discussed.

## 2024-01-05 NOTE — Progress Notes (Signed)
 Subjective:    Patient ID: Heather Beasley, female    DOB: 08-13-1956, 68 y.o.   MRN: [id number]  Patient Care Team: Mort Sawyers, FNP as PCP - General (Family Medicine) Kathleene Hazel, MD as PCP - Cardiology (Cardiology) Carmina Miller, MD as Referring Physician (Radiation Oncology) Linus Salmons, MD (Otolaryngology) Venetia Night, MD as Consulting Physician (Neurosurgery) Georgiana Spinner, NP as Nurse Practitioner (Vascular Surgery)  Chief Complaint  Patient presents with   Follow-up    No SOB. Little wheezing. Cough with pale yellow. She was sick last week.    BACKGROUND/INTERVAL:Lolah is a 67 year old former cigarette smoker (quit 2/24, 39 PY) with a history as noted below, who presents for follow-up on the issue of dyspnea and COPD.  Patient was last seen on 05 October 2023 at that time she was instructed to continue Lyles.  Since that visit she has had an episode of respiratory illness last week, did not require hospitalization.    HPI Discussed the use of AI scribe software for clinical note transcription with the patient, who gave verbal consent to proceed.  History of Present Illness   The patient, with COPD, presents with exacerbation and recent flu-like illness. Her husband recently experienced a flu-like illness and was hospitalized for four days due to a drop in oxygen levels to 82%, despite having no history of respiratory issues.  She is experiencing an exacerbation of COPD, characterized by severe wheezing and shortness of breath that began last week. She was prescribed prednisone and doxycycline, which she is currently taking, and will complete her prednisone course tomorrow. Her use of the albuterol rescue inhaler has decreased from three times a day last week to her baseline usage, only using it last night before bed. She continues to use [place] daily.  She notes benefit of the prednisone and doxycycline.  She has been taking Mucinex DM for  sinus congestion and reports significant sinus pain, particularly behind her eyes and in her face.  This is also getting better with the prednisone and doxycycline.  She takes Zyrtec daily for allergies.  She has a lung cancer screening CT scheduled for next week.     DATA 01/14/2022 CT chest lung cancer screening: Lung RADS 2, 8 mm groundglass nodule left lower lobe, emphysema, coronary calcifications noted 01/21/2022 echocardiogram: LVEF 70 to 75%, hyperdynamic LV, no wall motion abnormalities, moderate ASH and grade 1 DD.  Mild mitral valve regurgitation 01/26/2022 coronary CTA: Minimal coronary artery disease, PFO with left-to-right contrast flow 03/05/2022 PFTs: FEV1 1.60 L or 62% predicted, FVC 2.52 L or 75% predicted, FEV1/FVC 64%, no bronchodilator response, lung volumes normal.  Diffusion capacity moderately reduced, consistent with moderately severe obstructive airways disease, moderate diffusion impairment, probable concomitant restriction likely on the basis of obesity 05/01/2022 sleep study (in lab): No indication of sleep disordered breathing.  Consider other etiologies of somnolence. 01/15/2023 CT chest lung cancer screening: Lung RADS 2, 8 mm groundglass nodule on the left lower lobe unchanged, emphysema, coronary calcifications.  Review of Systems A 10 point review of systems was performed and it is as noted above otherwise negative.   Patient Active Problem List   Diagnosis Date Noted   COPD with acute exacerbation (HCC) 12/31/2023   Radicular pain in right arm 12/14/2023   S/P cervical spinal fusion (c3-6) 12/14/2023   Obesity (BMI 30-39.9) 08/25/2023   Nodule of left palm 08/25/2023   CVD (cardiovascular disease) 08/25/2023   Coronary artery disease involving native coronary artery of native  heart without angina pectoris 08/25/2023   Nonrheumatic mitral valve regurgitation 08/25/2023   Status post lumbar spinal fusion 03/08/2023   Lumbar adjacent segment disease with  spondylolisthesis 03/01/2023   S/P lumbar fusion 03/01/2023   Lumbar spinal stenosis due to adjacent segment disease after fusion procedure (L4-S1 fusion, Left L3/4) 11/11/2022   Lumbar post-laminectomy syndrome 11/11/2022   Chronic pain syndrome 11/11/2022   Sacroiliac joint pain 11/11/2022   Lumbar radiculopathy 07/17/2022   History of left foot drop 07/17/2022   Vitamin B12 deficiency 04/14/2022   Snoring 01/20/2022   DOE (dyspnea on exertion) 01/08/2022   Elevated brain natriuretic peptide (BNP) level 01/08/2022   Pulmonary emphysema (HCC) 01/08/2022   Pulmonary nodule less than 6 mm determined by computed tomography of lung 01/08/2022   Family history of breast cancer in female 11/17/2021   Carotid stenosis, left 06/27/2020   Primary osteoarthritis involving multiple joints 03/01/2018   Osteopenia 02/14/2018   HLD (hyperlipidemia) 12/01/2016   Irritable bowel syndrome with diarrhea 12/01/2016   Cancer of nasal cavity and sinus (HCC) 07/07/2015   Essential hypertension, benign 08/08/2013   Mixed urge and stress incontinence 03/27/2013   Allergic rhinitis 12/13/2012   Chronic headaches 02/22/2012   GERD (gastroesophageal reflux disease) 02/22/2012   Vitamin D deficiency 01/04/2012    Social History   Tobacco Use   Smoking status: Former    Current packs/day: 0.00    Average packs/day: 1 pack/day for 39.0 years (39.0 ttl pk-yrs)    Types: Cigarettes    Start date: 12/20/1983    Quit date: 12/19/2022    Years since quitting: 1.0   Smokeless tobacco: Never   Tobacco comments:    Using vape with no nicotine   Substance Use Topics   Alcohol use: Not Currently    Allergies  Allergen Reactions   Albumin (Human) Rash   Amoxicillin-Pot Clavulanate Hives   Gabapentin Shortness Of Breath   Latex Dermatitis and Rash   Betadine [Povidone Iodine] Dermatitis, Rash and Other (See Comments)    Skin burning Topical betadine and iodine have this reaction with patient.  IV contrast  is NOT a problem.  jkl   Sucralfate     Other reaction(s): Other (See Comments) Abdominal Pain   Amoxicillin     REACTION: rash   Chantix [Varenicline Tartrate]     Heart racing   Erythromycin     REACTION: rash   Sucralfate Nausea And Vomiting    Heart racing Lightheaded    Tegaderm Ag Mesh [Silver] Hives    Mild     Current Meds  Medication Sig   acetaminophen (TYLENOL) 650 MG CR tablet Take 650 mg by mouth every 8 (eight) hours as needed for pain.   albuterol (VENTOLIN HFA) 108 (90 Base) MCG/ACT inhaler Inhale 1-2 puffs into the lungs every 6 (six) hours as needed (for cough).   atorvastatin (LIPITOR) 20 MG tablet TAKE ONE TABLET BY MOUTH ONCE A DAY   benzonatate (TESSALON) 200 MG capsule Take 200 mg by mouth 3 (three) times daily as needed for cough.   Budeson-Glycopyrrol-Formoterol (BREZTRI AEROSPHERE) 160-9-4.8 MCG/ACT AERO Inhale 2 puffs into the lungs in the morning and at bedtime.   cetirizine (ZYRTEC) 10 MG tablet Take 10 mg by mouth daily. Allergies.   Cholecalciferol (VITAMIN D) 50 MCG (2000 UT) CAPS Take 2,000 Units by mouth.   clopidogrel (PLAVIX) 75 MG tablet Take 1 tablet (75 mg total) by mouth daily.   clotrimazole (MYCELEX) 10 MG troche Take 1 tablet (  10 mg total) by mouth 5 (five) times daily for 7 days.   cyclobenzaprine (FLEXERIL) 10 MG tablet Take 1 tablet (10 mg total) by mouth 3 (three) times daily as needed for muscle spasms.   doxycycline (VIBRA-TABS) 100 MG tablet Take 1 tablet (100 mg total) by mouth 2 (two) times daily for 10 days.   EPINEPHrine 0.3 mg/0.3 mL IJ SOAJ injection Inject 1 mg into the skin as needed.   Famotidine (PEPCID PO) Take 1 tablet by mouth at bedtime.   furosemide (LASIX) 20 MG tablet Take 1 tablet (20 mg total) by mouth as needed for fluid.   losartan (COZAAR) 50 MG tablet Take 1 tablet (50 mg total) by mouth daily.   montelukast (SINGULAIR) 10 MG tablet TAKE ONE TABLET BY MOUTH AT BEDTIME   Multiple Vitamins-Minerals (CENTRUM  ADULTS PO) Take 1 tablet by mouth.   oxymetazoline (AFRIN) 0.05 % nasal spray Place 1 spray into both nostrils 2 (two) times daily as needed for congestion. Use for nose bleeds   pantoprazole (PROTONIX) 40 MG tablet Take 1 tablet (40 mg total) by mouth daily.   [START ON 01/07/2024] predniSONE (DELTASONE) 20 MG tablet Take 1 tablet (20 mg total) by mouth daily with breakfast for 5 days.   predniSONE (STERAPRED UNI-PAK 21 TAB) 10 MG (21) TBPK tablet Take as directed    Immunization History  Administered Date(s) Administered   Influenza Split 07/27/2011, 08/25/2012   Influenza Whole 09/14/2007   Influenza,inj,Quad PF,6+ Mos 08/10/2013, 08/01/2015, 07/16/2016, 08/02/2018, 07/15/2019   Influenza,inj,quad, With Preservative 08/13/2017   Influenza-Unspecified 07/26/2021, 08/05/2022, 07/29/2023   Pneumococcal Conjugate-13 07/16/2016   Pneumococcal Polysaccharide-23 11/27/2011, 03/01/2018   Respiratory Syncytial Virus Vaccine,Recomb Aduvanted(Arexvy) 08/26/2022        Objective:     BP 138/82 (BP Location: Right Arm, Cuff Size: Normal)   Pulse 88   Temp (!) 97.1 F (36.2 C)   Ht 5\' 5"  (1.651 m)   Wt 186 lb 3.2 oz (84.5 kg)   SpO2 98%   BMI 30.99 kg/m   SpO2: 98 % O2 Device: None (Room air)  GENERAL: Overweight woman, no acute distress, fully ambulatory.  No conversational dyspnea.  Nasal quality to the speech. HEAD: Normocephalic, atraumatic.  EYES: Pupils equal, round, reactive to light.  No scleral icterus.  MOUTH: Wears dentures upper and lower, class III airway.  No thrush. NECK: Supple. No thyromegaly. Trachea midline. No JVD.  No adenopathy. PULMONARY: Good air entry bilaterally.  Very faint end expiratory wheezes. CARDIOVASCULAR: S1 and S2. Regular rate and rhythm.  Grade 2/6 systolic ejection murmur which increases with Valsalva.  No gallop. ABDOMEN: Benign. MUSCULOSKELETAL: No joint deformity, no clubbing, no edema.  NEUROLOGIC: Chronic left foot drop.  This affects  gait.  No other localizing finding. SKIN: Intact,warm,dry.  Nails are short (patient is a nail biter). PSYCH: Mood and behavior normal.     Assessment & Plan:     ICD-10-CM   1. Stage 2 moderate COPD by GOLD classification (HCC)  J44.9     2. Asymmetric septal hypertrophy  I51.7     3. Vapes non-nicotine containing substance  Z72.89     4. Former heavy cigarette smoker (20-39 per day)  Z87.891      Meds ordered this encounter  Medications   predniSONE (DELTASONE) 20 MG tablet    Sig: Take 1 tablet (20 mg total) by mouth daily with breakfast for 5 days.    Dispense:  5 tablet    Refill:  0  Discussion:    Chronic Obstructive Pulmonary Disease (COPD) exacerbation Recent COPD exacerbation likely triggered by a viral infection, with significant wheezing requiring increased albuterol use. Treated with prednisone and doxycycline, resulting in symptom improvement, though mild wheezing persists. Additional prednisone prescribed to ensure complete resolution and prevent further exacerbation. - Continue Breztri as maintenance therapy. - Extend prednisone with an additional five tablets of 20 mg after current course. - Monitor albuterol use; continue as needed for acute symptoms. - Advise to call if symptoms do not improve.  Flu-like illness Flu-like illness coincided with COPD exacerbation. Husband's hospitalization with low oxygen levels suggests possible viral infection. Doxycycline prescribed to cover potential bacterial superinfection. - Continue current course of doxycycline as prescribed.  Allergic rhinitis Sinus pain and pressure likely due to allergic rhinitis. Currently managed with daily Zyrtec. - Continue Zyrtec daily.  Lung cancer screening Lung cancer screening CT scheduled for January 13, 2024. Recent infection may affect results, but treatment is expected to resolve infection by scan date. - Proceed with lung cancer screening CT on January 13, 2024.      Advised if  symptoms do not improve or worsen, to please contact office for sooner follow up or seek emergency care.    I spent 30 minutes of dedicated to the care of this patient on the date of this encounter to include pre-visit review of records, face-to-face time with the patient discussing conditions above, post visit ordering of testing, clinical documentation with the electronic health record, making appropriate referrals as documented, and communicating necessary findings to members of the patients care team.     C. Danice Goltz, MD Advanced Bronchoscopy PCCM [facility]    *This note was generated using voice recognition software/Dragon and/or AI transcription program.  Despite best efforts to proofread, errors can occur which can change the meaning. Any transcriptional errors that result from this process are unintentional and may not be fully corrected at the time of dictation.

## 2024-01-10 DIAGNOSIS — J301 Allergic rhinitis due to pollen: Secondary | ICD-10-CM | POA: Diagnosis not present

## 2024-01-13 ENCOUNTER — Ambulatory Visit
Admission: RE | Admit: 2024-01-13 | Discharge: 2024-01-13 | Disposition: A | Discharge status: Home / Self Care | Source: Ambulatory Visit | Attending: Family | Admitting: Family

## 2024-01-13 DIAGNOSIS — Z87891 Personal history of nicotine dependence: Secondary | ICD-10-CM | POA: Insufficient documentation

## 2024-01-13 DIAGNOSIS — Z122 Encounter for screening for malignant neoplasm of respiratory organs: Secondary | ICD-10-CM | POA: Insufficient documentation

## 2024-01-13 DIAGNOSIS — F17210 Nicotine dependence, cigarettes, uncomplicated: Secondary | ICD-10-CM | POA: Insufficient documentation

## 2024-01-14 ENCOUNTER — Encounter: Payer: Self-pay | Admitting: Neurosurgery

## 2024-01-17 ENCOUNTER — Ambulatory Visit: Payer: Medicare PPO | Admitting: Podiatry

## 2024-01-17 ENCOUNTER — Encounter: Payer: Self-pay | Admitting: Podiatry

## 2024-01-17 DIAGNOSIS — L603 Nail dystrophy: Secondary | ICD-10-CM | POA: Diagnosis not present

## 2024-01-17 DIAGNOSIS — M722 Plantar fascial fibromatosis: Secondary | ICD-10-CM | POA: Diagnosis not present

## 2024-01-17 DIAGNOSIS — J301 Allergic rhinitis due to pollen: Secondary | ICD-10-CM | POA: Diagnosis not present

## 2024-01-17 MED ORDER — TRIAMCINOLONE ACETONIDE 40 MG/ML IJ SUSP
20.0000 mg | Freq: Once | INTRAMUSCULAR | Status: AC
  Administered 2024-01-17: 20 mg

## 2024-01-17 MED ORDER — TERBINAFINE HCL 250 MG PO TABS: 250.0000 mg | ORAL_TABLET | Freq: Every day | ORAL | 0 refills | Status: DC

## 2024-01-17 NOTE — Progress Notes (Signed)
 She presents today for follow-up of her Planter fasciitis left states is doing little better but have had to do a lot of walking with my sister in the hospital with renal cancer.  They are moving her to [facility] today.  She states that shot did help a lot last time she was then.  She is also here today for pathology results regarding her toenails.  Objective: Vital signs stable alert oriented x 3.  Pulses are palpable.  There is no erythema edema salines drainage or odor though she does have pain on palpation medial calcaneal tubercle of the left heel.  Pathology results do demonstrate Trichophyton rubrum and onychomycosis.  Assessment: Trichophyton rubrum onychomycosis.  Chronic intractable plantar fasciitis left.  Plan: Injected the left heel today 20 mg Kenalog 5 mg Marcaine point maximal tenderness.  Tolerated procedure well without complications.  And started her on oral Lamisil which she has had before she knows the possible side effects complication associated with that we did discuss that once again today I will follow-up with her in the near future for reevaluation see her in 1 month.

## 2024-01-24 ENCOUNTER — Ambulatory Visit
Attending: Student in an Organized Health Care Education/Training Program | Admitting: Student in an Organized Health Care Education/Training Program

## 2024-01-24 ENCOUNTER — Encounter: Payer: Self-pay | Admitting: Student in an Organized Health Care Education/Training Program

## 2024-01-24 ENCOUNTER — Ambulatory Visit
Admission: RE | Admit: 2024-01-24 | Discharge: 2024-01-24 | Disposition: A | Discharge status: Home / Self Care | Source: Ambulatory Visit | Attending: Student in an Organized Health Care Education/Training Program | Admitting: Student in an Organized Health Care Education/Training Program

## 2024-01-24 VITALS — BP 125/67 | HR 102 | Temp 98.1°F | Resp 16 | Ht 65.0 in | Wt 187.0 lb

## 2024-01-24 DIAGNOSIS — M5412 Radiculopathy, cervical region: Secondary | ICD-10-CM | POA: Insufficient documentation

## 2024-01-24 DIAGNOSIS — M792 Neuralgia and neuritis, unspecified: Secondary | ICD-10-CM | POA: Diagnosis not present

## 2024-01-24 DIAGNOSIS — M65331 Trigger finger, right middle finger: Secondary | ICD-10-CM | POA: Insufficient documentation

## 2024-01-24 DIAGNOSIS — M65332 Trigger finger, left middle finger: Secondary | ICD-10-CM | POA: Insufficient documentation

## 2024-01-24 DIAGNOSIS — Z981 Arthrodesis status: Secondary | ICD-10-CM | POA: Insufficient documentation

## 2024-01-24 MED ORDER — ROPIVACAINE HCL 2 MG/ML IJ SOLN
1.0000 mL | Freq: Once | INTRAMUSCULAR | Status: AC
  Administered 2024-01-24: 1 mL via EPIDURAL
  Filled 2024-01-24: qty 20

## 2024-01-24 MED ORDER — DEXAMETHASONE SODIUM PHOSPHATE 10 MG/ML IJ SOLN
10.0000 mg | Freq: Once | INTRAMUSCULAR | Status: AC
  Administered 2024-01-24: 10 mg
  Filled 2024-01-24: qty 1

## 2024-01-24 MED ORDER — SODIUM CHLORIDE 0.9% FLUSH
1.0000 mL | Freq: Once | INTRAVENOUS | Status: AC
  Administered 2024-01-24: 1 mL

## 2024-01-24 MED ORDER — IOHEXOL 180 MG/ML  SOLN
10.0000 mL | Freq: Once | INTRAMUSCULAR | Status: AC
  Administered 2024-01-24: 10 mL via EPIDURAL
  Filled 2024-01-24: qty 20

## 2024-01-24 MED ORDER — SODIUM CHLORIDE (PF) 0.9 % IJ SOLN
INTRAMUSCULAR | Status: AC
  Filled 2024-01-24: qty 10

## 2024-01-24 MED ORDER — DIAZEPAM 5 MG PO TABS
5.0000 mg | ORAL_TABLET | ORAL | Status: AC
Start: 2024-01-24 — End: 2024-01-24
  Administered 2024-01-24: 5 mg via ORAL

## 2024-01-24 MED ORDER — LIDOCAINE HCL 2 % IJ SOLN
20.0000 mL | Freq: Once | INTRAMUSCULAR | Status: AC
  Administered 2024-01-24: 200 mg
  Filled 2024-01-24: qty 20

## 2024-01-24 MED ORDER — DIAZEPAM 5 MG PO TABS
ORAL_TABLET | ORAL | Status: AC
  Filled 2024-01-24: qty 1

## 2024-01-24 NOTE — Patient Instructions (Signed)

## 2024-01-24 NOTE — Progress Notes (Signed)
 PROVIDER NOTE: Interpretation of information contained herein should be left to medically-trained personnel. Specific patient instructions are provided elsewhere under "Patient Instructions" section of medical record. This document was created in part using STT-dictation technology, any transcriptional errors that may result from this process are unintentional.  Patient: Heather Beasley Type: Established DOB: June 18, 1956 MRN: [id number] PCP: Mort Sawyers, FNP  Service: Procedure DOS: 01/24/2024 Setting: Ambulatory Location: Ambulatory outpatient facility Delivery: Face-to-face Provider: Edward Jolly, MD Specialty: Interventional Pain Management Specialty designation: 09 Location: Outpatient facility Ref. Prov.: Mort Sawyers, FNP       Interventional Therapy   Procedure: Cervical Epidural Steroid injection (CESI) (Interlaminar) #1  Laterality: Right  Level: T1-2 Imaging: Fluoroscopy-assisted DOS: 01/24/2024  Performed by: Edward Jolly, MD Anesthesia: Local anesthesia (1-2% Lidocaine) Sedation: Minimal Sedation                         Purpose: Diagnostic/Therapeutic Indications: Cervicalgia, cervical radicular pain, degenerative disc disease, severe enough to impact quality of life or function. 1. Acquired trigger finger of both middle fingers   2. Cervical radicular pain   3. S/P cervical spinal fusion (c3-6)   4. Radicular pain in right arm    NAS-11 score:   Pre-procedure: 3 /10   Post-procedure: 2 /10      Position  Prep  Materials:  Location setting: Procedure suite Position: Prone, on modified reverse trendelenburg to facilitate breathing, with head in head-cradle. Pillows positioned under chest (below chin-level) with cervical spine flexed. Safety Precautions: Patient was assessed for positional comfort and pressure points before starting the procedure. Prepping solution: DuraPrep (Iodine Povacrylex [0.7% available iodine] and Isopropyl Alcohol, 74% w/w) Prep Area:  Entire  cervicothoracic region Approach: percutaneous, paramedial Intended target: Posterior cervical epidural space Materials Procedure:  Tray: Epidural Needle(s): Epidural (Tuohy) Qty: 1 Length: (90mm) 3.5-inch Gauge: 22G   H&P (Pre-op Assessment):  Heather Beasley is a 68 y.o. (year old), female patient, seen today for interventional treatment. She  has a past surgical history that includes Appendectomy (1998); Abdominal hysterectomy (1998); Lumbar spine surgery (07/2004); Vaginal delivery; Cervical fusion (2010); Cholecystectomy (08/2011); Esophagogastroduodenoscopy (2012); Colonoscopy (N/A); Tonsillectomy (1968); Cystoscopy (N/A); Back surgery; Dilation and curettage of uterus; Fracture surgery (2001); Image guided sinus surgery (N/A, 06/25/2015); Polypectomy (N/A, 06/25/2015); Oophorectomy; CAROTID PTA/STENT INTERVENTION (Left, 06/27/2020); Cataract extraction; Anterior lumbar fusion (N/A, 03/01/2023); and Application of intraoperative CT scan (N/A, 03/01/2023). Heather Beasley has a current medication list which includes the following prescription(s): acetaminophen, albuterol, atorvastatin, benzonatate, breztri aerosphere, cetirizine, vitamin d, clopidogrel, cyclobenzaprine, epinephrine, famotidine, furosemide, losartan, montelukast, multiple vitamins-minerals, oxymetazoline, pantoprazole, prednisone, and terbinafine. Her primarily concern today is the Neck Pain  Initial Vital Signs:  Pulse/HCG Rate: (!) 102ECG Heart Rate: 94 Temp: 98.1 F (36.7 C) Resp: 16 BP: 130/82 SpO2: 97 %  BMI: Estimated body mass index is 31.12 kg/m as calculated from the following:   Height as of this encounter: 5\' 5"  (1.651 m).   Weight as of this encounter: 187 lb (84.8 kg).  Risk Assessment: Allergies: Reviewed. She is allergic to albumin (human), amoxicillin-pot clavulanate, gabapentin, latex, betadine [povidone iodine], sucralfate, amoxicillin, chantix [varenicline tartrate], erythromycin, sucralfate, and tegaderm  ag mesh [silver].  Allergy Precautions: None required Coagulopathies: Reviewed. None identified.  Blood-thinner therapy: None at this time Active Infection(s): Reviewed. None identified. Heather Beasley is afebrile  Site Confirmation: [name] was asked to confirm the procedure and laterality before marking the site Procedure checklist: Completed Consent: Before the procedure and under the  influence of no sedative(s), amnesic(s), or anxiolytics, the patient was informed of the treatment options, risks and possible complications. To fulfill our ethical and legal obligations, as recommended by the American Medical Association's Code of Ethics, I have informed the patient of my clinical impression; the nature and purpose of the treatment or procedure; the risks, benefits, and possible complications of the intervention; the alternatives, including doing nothing; the risk(s) and benefit(s) of the alternative treatment(s) or procedure(s); and the risk(s) and benefit(s) of doing nothing. The patient was provided information about the general risks and possible complications associated with the procedure. These may include, but are not limited to: failure to achieve desired goals, infection, bleeding, organ or nerve damage, allergic reactions, paralysis, and death. In addition, the patient was informed of those risks and complications associated to Spine-related procedures, such as failure to decrease pain; infection (i.e.: Meningitis, epidural or intraspinal abscess); bleeding (i.e.: epidural hematoma, subarachnoid hemorrhage, or any other type of intraspinal or peri-dural bleeding); organ or nerve damage (i.e.: Any type of peripheral nerve, nerve root, or spinal cord injury) with subsequent damage to sensory, motor, and/or autonomic systems, resulting in permanent pain, numbness, and/or weakness of one or several areas of the body; allergic reactions; (i.e.: anaphylactic reaction); and/or death. Furthermore, the  patient was informed of those risks and complications associated with the medications. These include, but are not limited to: allergic reactions (i.e.: anaphylactic or anaphylactoid reaction(s)); adrenal axis suppression; blood sugar elevation that in diabetics may result in ketoacidosis or comma; water retention that in patients with history of congestive heart failure may result in shortness of breath, pulmonary edema, and decompensation with resultant heart failure; weight gain; swelling or edema; medication-induced neural toxicity; particulate matter embolism and blood vessel occlusion with resultant organ, and/or nervous system infarction; and/or aseptic necrosis of one or more joints. Finally, the patient was informed that Medicine is not an exact science; therefore, there is also the possibility of unforeseen or unpredictable risks and/or possible complications that may result in a catastrophic outcome. The patient indicated having understood very clearly. We have given the patient no guarantees and we have made no promises. Enough time was given to the patient to ask questions, all of which were answered to the patient's satisfaction. Ms. Hutto has indicated that she wanted to continue with the procedure. Attestation: I, the ordering provider, attest that I have discussed with the patient the benefits, risks, side-effects, alternatives, likelihood of achieving goals, and potential problems during recovery for the procedure that I have provided informed consent. Date  Time: 01/24/2024 10:20 AM   Pre-Procedure Preparation:  Monitoring: As per clinic protocol. Respiration, ETCO2, SpO2, BP, heart rate and rhythm monitor placed and checked for adequate function Safety Precautions: Patient was assessed for positional comfort and pressure points before starting the procedure. Time-out: I initiated and conducted the "Time-out" before starting the procedure, as per protocol. The patient was asked to  participate by confirming the accuracy of the "Time Out" information. Verification of the correct person, site, and procedure were performed and confirmed by me, the nursing staff, and the patient. "Time-out" conducted as per Joint Commission's Universal Protocol (UP.01.01.01). Time: 1117 Start Time: 1117 hrs.  Description  Narrative of Procedure:          Rationale (medical necessity): procedure needed and proper for the diagnosis and/or treatment of the patient's medical symptoms and needs. Start Time: 1117 hrs. Safety Precautions: Aspiration looking for blood return was conducted prior to all injections. At no point  did we inject any substances, as a needle was being advanced. No attempts were made at seeking any paresthesias. Safe injection practices and needle disposal techniques used. Medications properly checked for expiration dates. SDV (single dose vial) medications used. Description of procedure: Protocol guidelines were followed. The patient was assisted into a comfortable position. The target area was identified and the area prepped in the usual manner. Skin & deeper tissues infiltrated with local anesthetic. Appropriate amount of time allowed to pass for local anesthetics to take effect. Using fluoroscopic guidance, the epidural needle was introduced through the skin, ipsilateral to the reported pain, and advanced to the target area. Posterior laminar os was contacted and the needle walked caudad, until the lamina was cleared. The ligamentum flavum was engaged and the epidural space identified using "loss-of-resistance technique" with 2-3 ml of PF-NaCl (0.9% NSS), in a 5cc dedicated LOR syringe. (See "Imaging guidance" below for use of contrast details.) Once proper needle placement was secured, and negative aspiration confirmed, the solution was injected in intermittent fashion, asking for systemic symptoms every 0.5cc. The needles were then removed and the area cleansed, making sure to leave  some of the prepping solution back to take advantage of its long term bactericidal properties.  Vitals:   01/24/24 1112 01/24/24 1115 01/24/24 1119 01/24/24 1121  BP: 136/83 (!) 142/76 (!) 145/77 125/67  Pulse:      Resp: (!) 24 (!) 27 (!) 22 16  Temp:      TempSrc:      SpO2: 98% 98% 100% 95%  Weight:      Height:         End Time: 1120 hrs.  Imaging Guidance (Spinal):          Type of Imaging Technique: Fluoroscopy Guidance (Spinal) Indication(s): Fluoroscopy guidance for needle placement to enhance accuracy in procedures requiring precise needle localization for targeted delivery of medication in or near specific anatomical locations not easily accessible without such real-time imaging assistance. Exposure Time: Please see nurses notes. Contrast: Before injecting any contrast, we confirmed that the patient did not have an allergy to iodine, shellfish, or radiological contrast. Once satisfactory needle placement was completed at the desired level, radiological contrast was injected. Contrast injected under live fluoroscopy. No contrast complications. See chart for type and volume of contrast used. Fluoroscopic Guidance: I was personally present during the use of fluoroscopy. "Tunnel Vision Technique" used to obtain the best possible view of the target area. Parallax error corrected before commencing the procedure. "Direction-depth-direction" technique used to introduce the needle under continuous pulsed fluoroscopy. Once target was reached, antero-posterior, oblique, and lateral fluoroscopic projection used confirm needle placement in all planes. Images permanently stored in EMR. Interpretation: I personally interpreted the imaging intraoperatively. Adequate needle placement confirmed in multiple planes. Appropriate spread of contrast into desired area was observed. No evidence of afferent or efferent intravascular uptake. No intrathecal or subarachnoid spread observed. Permanent images saved  into the patient's record.  Post-operative Assessment:  Post-procedure Vital Signs:  Pulse/HCG Rate: (!) 10296 Temp: 98.1 F (36.7 C) Resp: 16 BP: 125/67 SpO2: 95 %  EBL: None  Complications: No immediate post-treatment complications observed by team, or reported by patient.  Note: The patient tolerated the entire procedure well. A repeat set of vitals were taken after the procedure and the patient was kept under observation following institutional policy, for this type of procedure. Post-procedural neurological assessment was performed, showing return to baseline, prior to discharge. The patient was provided with post-procedure discharge  instructions, including a section on how to identify potential problems. Should any problems arise concerning this procedure, the patient was given instructions to immediately contact us, at any time, without hesitation. In any case, we plan to contact the patient by telephone for a follow-up status report regarding this interventional procedure.  Comments:  No additional relevant information.  Plan of Care (POC)  Orders:   Patient is also complaining of bilateral trigger fingers of both hands specifically of her middle finger and her ring finger.  She states that she has spoke with orthopedic surgery who discussed a trigger finger release.  She has not had a trigger finger injection and would like to try that before considering surgery.  Orders Placed This Encounter  Procedures   Injection tendon or ligament    Standing Status:   Future    Expected Date:   02/07/2024    Expiration Date:   04/24/2024    Scheduling Instructions:     Trigger finger injection both hands   [facility] C-ARM 1-60 MIN NO REPORT    Intraoperative interpretation by procedural physician at [facility].    Standing Status:   Standing    Number of Occurrences:   1    Reason for exam::   Assistance in needle guidance and placement for procedures requiring needle  placement in or near specific anatomical locations not easily accessible without such assistance.     Medications ordered for procedure: Meds ordered this encounter  Medications   iohexol (OMNIPAQUE) 180 MG/ML injection 10 mL    Must be Myelogram-compatible. If not available, you may substitute with a water-soluble, non-ionic, hypoallergenic, myelogram-compatible radiological contrast medium.   lidocaine (XYLOCAINE) 2 % (with pres) injection 400 mg   ropivacaine (PF) 2 mg/mL (0.2%) (NAROPIN) injection 1 mL   sodium chloride flush (NS) 0.9 % injection 1 mL   dexamethasone (DECADRON) injection 10 mg   diazepam (VALIUM) tablet 5 mg    Make sure Flumazenil is available in the pyxis when using this medication. If oversedation occurs, administer 0.2 mg IV over 15 sec. If after 45 sec no response, administer 0.2 mg again over 1 min; may repeat at 1 min intervals; not to exceed 4 doses (1 mg)   Medications administered: We administered iohexol, lidocaine, ropivacaine (PF) 2 mg/mL (0.2%), sodium chloride flush, dexamethasone, and diazepam.  See the medical record for exact dosing, route, and time of administration.  Follow-up plan:   Return in about 3 weeks (around 02/14/2024) for trigger finger injection (B/L).       Right T1-2 ESI    Recent Visits Date Type Provider Dept  12/14/23 Office Visit Edward Jolly, MD [facility]  Showing recent visits within past 90 days and meeting all other requirements Today's Visits Date Type Provider Dept  01/24/24 Procedure visit Edward Jolly, MD [facility]  Showing today's visits and meeting all other requirements Future Appointments Date Type Provider Dept  02/14/24 Appointment Edward Jolly, MD [facility]  Showing future appointments within next 90 days and meeting all other requirements  Disposition: Discharge home  Discharge (Date  Time): 01/24/2024; 1126 hrs.   Primary Care Physician: Mort Sawyers,  FNP Location: [facility] Note by: Edward Jolly, MD (TTS technology used. I apologize for any typographical errors that were not detected and corrected.) Date: 01/24/2024; Time: 11:31 AM  Disclaimer:  Medicine is not an Visual merchandiser. The only guarantee in medicine is that nothing is guaranteed. It  is important to note that the decision to proceed with this intervention was based on the information collected from the patient. The Data and conclusions were drawn from the patient's questionnaire, the interview, and the physical examination. Because the information was provided in large part by the patient, it cannot be guaranteed that it has not been purposely or unconsciously manipulated. Every effort has been made to obtain as much relevant data as possible for this evaluation. It is important to note that the conclusions that lead to this procedure are derived in large part from the available data. Always take into account that the treatment will also be dependent on availability of resources and existing treatment guidelines, considered by other Pain Management Practitioners as being common knowledge and practice, at the time of the intervention. For Medico-Legal purposes, it is also important to point out that variation in procedural techniques and pharmacological choices are the acceptable norm. The indications, contraindications, technique, and results of the above procedure should only be interpreted and judged by a Board-Certified Interventional Pain Specialist with extensive familiarity and expertise in the same exact procedure and technique.

## 2024-01-24 NOTE — Progress Notes (Signed)
 Safety precautions to be maintained throughout the outpatient stay will include: orient to surroundings, keep bed in low position, maintain call bell within reach at all times, provide assistance with transfer out of bed and ambulation.

## 2024-01-25 ENCOUNTER — Telehealth: Payer: Self-pay | Admitting: *Deleted

## 2024-01-25 NOTE — Telephone Encounter (Signed)
 No problems post procedure.

## 2024-01-27 DIAGNOSIS — J340 Abscess, furuncle and carbuncle of nose: Secondary | ICD-10-CM | POA: Diagnosis not present

## 2024-01-27 DIAGNOSIS — Z8522 Personal history of malignant neoplasm of nasal cavities, middle ear, and accessory sinuses: Secondary | ICD-10-CM | POA: Diagnosis not present

## 2024-01-27 DIAGNOSIS — Z08 Encounter for follow-up examination after completed treatment for malignant neoplasm: Secondary | ICD-10-CM | POA: Diagnosis not present

## 2024-01-31 DIAGNOSIS — J301 Allergic rhinitis due to pollen: Secondary | ICD-10-CM | POA: Diagnosis not present

## 2024-02-07 DIAGNOSIS — J301 Allergic rhinitis due to pollen: Secondary | ICD-10-CM | POA: Diagnosis not present

## 2024-02-09 DIAGNOSIS — J301 Allergic rhinitis due to pollen: Secondary | ICD-10-CM | POA: Diagnosis not present

## 2024-02-10 ENCOUNTER — Other Ambulatory Visit: Payer: Self-pay | Admitting: Neurosurgery

## 2024-02-14 ENCOUNTER — Ambulatory Visit
Attending: Student in an Organized Health Care Education/Training Program | Admitting: Student in an Organized Health Care Education/Training Program

## 2024-02-14 ENCOUNTER — Encounter: Payer: Self-pay | Admitting: Student in an Organized Health Care Education/Training Program

## 2024-02-14 VITALS — BP 159/75 | HR 91 | Temp 97.6°F | Resp 16 | Ht 65.0 in | Wt 187.0 lb

## 2024-02-14 DIAGNOSIS — M65332 Trigger finger, left middle finger: Secondary | ICD-10-CM | POA: Diagnosis not present

## 2024-02-14 DIAGNOSIS — J301 Allergic rhinitis due to pollen: Secondary | ICD-10-CM | POA: Diagnosis not present

## 2024-02-14 DIAGNOSIS — G894 Chronic pain syndrome: Secondary | ICD-10-CM | POA: Diagnosis not present

## 2024-02-14 DIAGNOSIS — M65331 Trigger finger, right middle finger: Secondary | ICD-10-CM | POA: Insufficient documentation

## 2024-02-14 MED ORDER — ROPIVACAINE HCL 2 MG/ML IJ SOLN
4.0000 mL | Freq: Once | INTRAMUSCULAR | Status: AC
  Administered 2024-02-14: 4 mL via INTRA_ARTICULAR
  Filled 2024-02-14: qty 20

## 2024-02-14 MED ORDER — DEXAMETHASONE SODIUM PHOSPHATE 10 MG/ML IJ SOLN
10.0000 mg | Freq: Once | INTRAMUSCULAR | Status: AC
  Administered 2024-02-14: 10 mg
  Filled 2024-02-14: qty 1

## 2024-02-14 NOTE — Patient Instructions (Signed)

## 2024-02-14 NOTE — Progress Notes (Signed)
 PROVIDER NOTE: Interpretation of information contained herein should be left to medically-trained personnel. Specific patient instructions are provided elsewhere under "Patient Instructions" section of medical record. This document was created in part using STT-dictation technology, any transcriptional errors that may result from this process are unintentional.  Patient: Heather Beasley Type: Established DOB: 10-14-1956 MRN: [id number] PCP: Felicita Horns, FNP  Service: Procedure DOS: 02/14/2024 Setting: Ambulatory Location: Ambulatory outpatient facility Delivery: Face-to-face Provider: Cephus Collin, MD Specialty: Interventional Pain Management Specialty designation: 09 Location: Outpatient facility Ref. Prov.: Felicita Horns, FNP       Interventional Therapy   Primary Reason for Visit: Interventional Pain Management Treatment. CC: Hand Pain (Left trigger finger )    Procedure:          Anesthesia, Analgesia, Anxiolysis:  Type: Trigger Finger Ligament/Tendon sheath (20550) Injection.  #1  Purpose: Diagnostic Target Area: Flexor Digitorum Tendon sheath nodule Region: A-1(proximal) pulley of the metacarpal area Approach: Percutaneous Digit: No:3(Middle) Finger Laterality: Left-hand  Type: Local Anesthesia Local Anesthetic: Lidocaine  1-2% Sedation: None  Indication(s):  Analgesia Route: Infiltration ([place]/IM) IV Access: N/A   Position: Sitting   1. Acquired trigger finger of both middle fingers   2. Chronic pain syndrome    NAS-11 Pain score:   Pre-procedure: 2/10   Post-procedure: 0-No pain/10     H&P (Pre-op Assessment):  Heather Beasley is a 68 y.o. (year old), female patient, seen today for interventional treatment. She  has a past surgical history that includes Appendectomy (1998); Abdominal hysterectomy (1998); Lumbar spine surgery (07/2004); Vaginal delivery; Cervical fusion (2010); Cholecystectomy (08/2011); Esophagogastroduodenoscopy (2012); Colonoscopy (N/A);  Tonsillectomy (1968); Cystoscopy (N/A); Back surgery; Dilation and curettage of uterus; Fracture surgery (2001); Image guided sinus surgery (N/A, 06/25/2015); Polypectomy (N/A, 06/25/2015); Oophorectomy; CAROTID PTA/STENT INTERVENTION (Left, 06/27/2020); Cataract extraction; Anterior lumbar fusion (N/A, 03/01/2023); and Application of intraoperative CT scan (N/A, 03/01/2023). Heather Beasley has a current medication list which includes the following prescription(s): acetaminophen , albuterol , atorvastatin , benzonatate , breztri  aerosphere, cetirizine, vitamin d , clopidogrel , cyclobenzaprine , epinephrine , famotidine , furosemide , losartan , montelukast , multiple vitamins-minerals, oxymetazoline , pantoprazole , terbinafine , and prednisone . Her primarily concern today is the Hand Pain (Left trigger finger )  Initial Vital Signs:  Pulse/HCG Rate: (!) 112  Temp: 97.6 F (36.4 C) Resp: 16 BP: (!) 150/103 SpO2: 94 %  BMI: Estimated body mass index is 31.12 kg/m as calculated from the following:   Height as of this encounter: 5\' 5"  (1.651 m).   Weight as of this encounter: 187 lb (84.8 kg).  Risk Assessment: Allergies: Reviewed. She is allergic to albumin (human), amoxicillin-pot clavulanate, gabapentin, latex, betadine [povidone iodine ], sucralfate, amoxicillin, chantix [varenicline tartrate], erythromycin, sucralfate, and tegaderm ag mesh [silver].  Allergy Precautions: None required Coagulopathies: Reviewed. None identified.  Blood-thinner therapy: None at this time Active Infection(s): Reviewed. None identified. Heather Beasley is afebrile  Site Confirmation: Heather Beasley was asked to confirm the procedure and laterality before marking the site Procedure checklist: Completed Consent: Before the procedure and under the influence of no sedative(s), amnesic(s), or anxiolytics, the patient was informed of the treatment options, risks and possible complications. To fulfill our ethical and legal obligations, as recommended  by the American Medical Association's Code of Ethics, I have informed the patient of my clinical impression; the nature and purpose of the treatment or procedure; the risks, benefits, and possible complications of the intervention; the alternatives, including doing nothing; the risk(s) and benefit(s) of the alternative treatment(s) or procedure(s); and the risk(s) and benefit(s) of doing nothing. The patient was provided information  about the general risks and possible complications associated with the procedure. These may include, but are not limited to: failure to achieve desired goals, infection, bleeding, organ or nerve damage, allergic reactions, paralysis, and death. In addition, the patient was informed of those risks and complications associated to the procedure, such as failure to decrease pain; infection; bleeding; organ or nerve damage with subsequent damage to sensory, motor, and/or autonomic systems, resulting in permanent pain, numbness, and/or weakness of one or several areas of the body; allergic reactions; (i.e.: anaphylactic reaction); and/or death. Furthermore, the patient was informed of those risks and complications associated with the medications. These include, but are not limited to: allergic reactions (i.e.: anaphylactic or anaphylactoid reaction(s)); adrenal axis suppression; blood sugar elevation that in diabetics may result in ketoacidosis or comma; water  retention that in patients with history of congestive heart failure may result in shortness of breath, pulmonary edema, and decompensation with resultant heart failure; weight gain; swelling or edema; medication-induced neural toxicity; particulate matter embolism and blood vessel occlusion with resultant organ, and/or nervous system infarction; and/or aseptic necrosis of one or more joints. Finally, the patient was informed that Medicine is not an exact science; therefore, there is also the possibility of unforeseen or unpredictable  risks and/or possible complications that may result in a catastrophic outcome. The patient indicated having understood very clearly. We have given the patient no guarantees and we have made no promises. Enough time was given to the patient to ask questions, all of which were answered to the patient's satisfaction. Ms. Burnsworth has indicated that she wanted to continue with the procedure. Attestation: I, the ordering provider, attest that I have discussed with the patient the benefits, risks, side-effects, alternatives, likelihood of achieving goals, and potential problems during recovery for the procedure that I have provided informed consent. Date  Time: 02/14/2024 11:04 AM  Pre-Procedure Preparation:  Monitoring: As per clinic protocol. Respiration, ETCO2, SpO2, BP, heart rate and rhythm monitor placed and checked for adequate function Safety Precautions: Patient was assessed for positional comfort and pressure points before starting the procedure. Time-out: I initiated and conducted the "Time-out" before starting the procedure, as per protocol. The patient was asked to participate by confirming the accuracy of the "Time Out" information. Verification of the correct person, site, and procedure were performed and confirmed by me, the nursing staff, and the patient. "Time-out" conducted as per Joint Commission's Universal Protocol (UP.01.01.01). Time: 1200 Start Time: 1200 hrs.  Description of Procedure:          Area Prepped: Entire palmar and dorsal aspect of hand, up to forearm area. ChloraPrep (2% chlorhexidine  gluconate and 70% isopropyl alcohol) Safety Precautions: Aspiration looking for blood return was conducted prior to all injections. At no point did we inject any substances, as a needle was being advanced. No attempts were made at seeking any paresthesias. Safe injection practices and needle disposal techniques used. Medications properly checked for expiration dates. SDV (single dose vial)  medications used. Description of the Procedure: Protocol guidelines were followed. The patient was placed in position. The target area was identified and prepped in the usual manner. Skin & deeper tissues infiltrated with local anesthetic. Appropriate time provided for local anesthetics to take effect. The procedure needle was slowly advanced to target area. Proper needle placement secured. Negative aspiration confirmed. Solution injected in intermittent fashion, asking for systemic symptoms every 0.5cc. Needle(s) removed and area cleaned, making sure to leave some prepping solution back to take advantage of its long term  bactericidal properties.  Vitals:   02/14/24 1106 02/14/24 1117  BP: (!) 150/103 (!) 159/75  Pulse: (!) 112 91  Resp: 16   Temp: 97.6 F (36.4 C)   TempSrc: Temporal   SpO2: 94%   Weight: 187 lb (84.8 kg)   Height: 5\' 5"  (1.651 m)     Start Time: 1200 hrs. End Time: 1207 hrs. Materials:   25-gauge 1.5 inch needle  5 cc solution made of 4 cc of 0.2% ropivacaine , 1 cc of Decadron  10 mg/cc.  This was injected along the A1 pulley at the point of maximal tenderness and obstruction during flexion. Patient tolerated procedure well without any complications.  Medication(s): Please see orders for medications and dosing details.    Type of Imaging Technique: None used Indication(s): N/A Exposure Time: No patient exposure Contrast: None used. Fluoroscopic Guidance: N/A Ultrasound Guidance: N/A Interpretation: N/A  Post-operative Assessment:  Post-procedure Vital Signs:  Pulse/HCG Rate: 91  Temp: 97.6 F (36.4 C) Resp: 16 BP: (!) 159/75 SpO2: 94 %  EBL: None  Complications: No immediate post-treatment complications observed by team, or reported by patient.  Note: The patient tolerated the entire procedure well. A repeat set of vitals were taken after the procedure and the patient was kept under observation following institutional policy, for this type of  procedure. Post-procedural neurological assessment was performed, showing return to baseline, prior to discharge. The patient was provided with post-procedure discharge instructions, including a section on how to identify potential problems. Should any problems arise concerning this procedure, the patient was given instructions to immediately contact us , at any time, without hesitation. In any case, we plan to contact the patient by telephone for a follow-up status report regarding this interventional procedure.  Comments:  No additional relevant information.  Plan of Care (POC)  Orders:  No orders of the defined types were placed in this encounter.   Medications ordered for procedure: Meds ordered this encounter  Medications   dexamethasone  (DECADRON ) injection 10 mg   ropivacaine  (PF) 2 mg/mL (0.2%) (NAROPIN ) injection 4 mL   Medications administered: We administered dexamethasone  and ropivacaine  (PF) 2 mg/mL (0.2%).  See the medical record for exact dosing, route, and time of administration.  Follow-up plan:   Return in about 4 weeks (around 03/13/2024), or PPE VV.       Right T1-2 ESI; left middle finger trigger finger injection 02/14/2024     Recent Visits Date Type Provider Dept  01/24/24 Procedure visit Cephus Collin, MD [facility]  12/14/23 Office Visit Cephus Collin, MD [facility]  Showing recent visits within past 90 days and meeting all other requirements Today's Visits Date Type Provider Dept  02/14/24 Procedure visit Cephus Collin, MD [facility]  Showing today's visits and meeting all other requirements Future Appointments Date Type Provider Dept  03/13/24 Appointment Cephus Collin, MD [facility]  Showing future appointments within next 90 days and meeting all other requirements  Disposition: Discharge home  Discharge (Date  Time): 02/14/2024; 1207 hrs.   Primary Care Physician: Felicita Horns, FNP Location: [facility]  Outpatient Pain Management Facility Note by: Cephus Collin, MD (TTS technology used. I apologize for any typographical errors that were not detected and corrected.) Date: 02/14/2024; Time: 12:19 PM  Disclaimer:  Medicine is not an Visual merchandiser. The only guarantee in medicine is that nothing is guaranteed. It is important to note that the decision to proceed with this intervention was based on the information collected from the patient. The  Data and conclusions were drawn from the patient's questionnaire, the interview, and the physical examination. Because the information was provided in large part by the patient, it cannot be guaranteed that it has not been purposely or unconsciously manipulated. Every effort has been made to obtain as much relevant data as possible for this evaluation. It is important to note that the conclusions that lead to this procedure are derived in large part from the available data. Always take into account that the treatment will also be dependent on availability of resources and existing treatment guidelines, considered by other Pain Management Practitioners as being common knowledge and practice, at the time of the intervention. For Medico-Legal purposes, it is also important to point out that variation in procedural techniques and pharmacological choices are the acceptable norm. The indications, contraindications, technique, and results of the above procedure should only be interpreted and judged by a Board-Certified Interventional Pain Specialist with extensive familiarity and expertise in the same exact procedure and technique.

## 2024-02-14 NOTE — Progress Notes (Signed)
 Safety precautions to be maintained throughout the outpatient stay will include: orient to surroundings, keep bed in low position, maintain call bell within reach at all times, provide assistance with transfer out of bed and ambulation.

## 2024-02-15 ENCOUNTER — Telehealth: Payer: Self-pay

## 2024-02-15 NOTE — Telephone Encounter (Signed)
 No issues post-procedure.

## 2024-02-16 ENCOUNTER — Ambulatory Visit: Admitting: Podiatry

## 2024-02-16 ENCOUNTER — Encounter: Payer: Self-pay | Admitting: Podiatry

## 2024-02-16 DIAGNOSIS — M722 Plantar fascial fibromatosis: Secondary | ICD-10-CM | POA: Diagnosis not present

## 2024-02-16 DIAGNOSIS — Z79899 Other long term (current) drug therapy: Secondary | ICD-10-CM

## 2024-02-16 DIAGNOSIS — L603 Nail dystrophy: Secondary | ICD-10-CM | POA: Diagnosis not present

## 2024-02-16 MED ORDER — TERBINAFINE HCL 250 MG PO TABS: 250.0000 mg | ORAL_TABLET | Freq: Every day | ORAL | 2 refills | Status: AC

## 2024-02-16 NOTE — Progress Notes (Signed)
 She presents today for follow-up of her plantar fasciitis and Lamisil  therapy.  She states that she took the first 30 days of Lamisil  denies fever chills nausea mobic muscle aches pains calf pain back pain chest pain shortness of breath.  States that her plantar fasciitis is approximately 50% improved left foot.  Objective: Vital signs are stable alert oriented x 3 no change in nail status as of yet.  She does have tenderness on palpation medial calcaneal tubercle of the left heel.  Assessment: Long-term therapy with Lamisil  for onychomycosis.  Plantar fasciitis left foot.  Plan: Reinjected the left foot today 20 mg Kenalog  5 mg Marcaine  for maximal tenderness.  Tolerated procedure well.  Also requested blood work consisting of a comprehensive metabolic panel to evaluate kidney and liver function.  Should this come back abnormal I will notify her.  Otherwise I did write her another prescription for 90 days of Lamisil .  I will follow-up with her in 1 month for the plantar fasciitis in 4 months for the Lamisil .

## 2024-02-17 ENCOUNTER — Telehealth: Payer: Self-pay | Admitting: *Deleted

## 2024-02-17 DIAGNOSIS — M722 Plantar fascial fibromatosis: Secondary | ICD-10-CM | POA: Diagnosis not present

## 2024-02-17 DIAGNOSIS — L603 Nail dystrophy: Secondary | ICD-10-CM | POA: Diagnosis not present

## 2024-02-17 DIAGNOSIS — Z79899 Other long term (current) drug therapy: Secondary | ICD-10-CM | POA: Diagnosis not present

## 2024-02-17 LAB — HEPATIC FUNCTION PANEL
ALT: 17 IU/L (ref 0–32)
AST: 22 IU/L (ref 0–40)
Albumin: 4.3 g/dL (ref 3.9–4.9)
Alkaline Phosphatase: 101 IU/L (ref 44–121)
Bilirubin Total: 0.5 mg/dL (ref 0.0–1.2)
Bilirubin, Direct: 0.22 mg/dL (ref 0.00–0.40)
Total Protein: 6.7 g/dL (ref 6.0–8.5)

## 2024-02-17 MED ORDER — TRIAMCINOLONE ACETONIDE 40 MG/ML IJ SUSP
20.0000 mg | Freq: Once | INTRAMUSCULAR | Status: AC
  Administered 2024-02-17: 20 mg

## 2024-02-17 NOTE — Addendum Note (Signed)
 Addended by: Sanda Crome on: 02/17/2024 08:28 AM   Modules accepted: Orders, Level of Service

## 2024-02-17 NOTE — Telephone Encounter (Signed)
-----   Message from Clemetine Cypher sent at 02/17/2024 11:51 AM EDT ----- I blood work looks good.  Continue current medication.

## 2024-02-18 ENCOUNTER — Telehealth: Payer: Self-pay | Admitting: Acute Care

## 2024-02-18 DIAGNOSIS — Z122 Encounter for screening for malignant neoplasm of respiratory organs: Secondary | ICD-10-CM

## 2024-02-18 DIAGNOSIS — Z87891 Personal history of nicotine dependence: Secondary | ICD-10-CM

## 2024-02-18 NOTE — Telephone Encounter (Signed)
 I have called the patient with the results of her low-dose screening CT.  Her scan was read as a lung RADS 48-month follow-up. She has a left lower lobe nonsolid nodule that has had slow growth over the last 3 years.  In 2023 this nodule measured 8.3 mm, in 2024 the nodule measures 8.6 mm, and in 2025 the nodule measures 9.1 mm. I did discuss with the patient doing a 71-month follow-up to see if the nodule continues to grow.  At that interval if there was continued growth I would  have most likely considered a PET scan.  Patient states that she would prefer not to do that as she understands scans that are not the annual scan oftentimes result in some payment from the patient.  She would prefer not to do an earlier scan due to cost, which I understand.  Patient is followed by Dr. Viva Grise at the [facility] office.  She has an appointment with her GYN in 2025.  I have encouraged her to talk to Dr. Viva Grise at the time of that appointment regarding the slow growth of this left lower lobe partially solid nodule to see if Dr. Viva Grise feels there needs to be earlier imaging than 12 months.  Patient verbalized understanding of the above and had no further questions at completion of the call.  Arlean Bellow, Margretta Shi and [place] please fax results to PCP and let them know plan is for 21-month follow-up however that we are watching a slowly growing left lower lobe nonsolid nodule.  Dr. Viva Grise, the slow growth concerns me.  Once it gets big enough I think we should probably pet it.  Please let me know your thoughts once you see her in the office in June

## 2024-02-19 NOTE — Telephone Encounter (Signed)
 I spoke with patient on 19 February 2024 at 10 AM.  She is in agreement with reconvening during follow-up visit on 12 June and discussing next steps.

## 2024-02-21 DIAGNOSIS — J301 Allergic rhinitis due to pollen: Secondary | ICD-10-CM | POA: Diagnosis not present

## 2024-02-21 NOTE — Telephone Encounter (Signed)
 Results/ plans faxed to PCP. Order placed for 12 month lung screening CT.

## 2024-02-24 ENCOUNTER — Telehealth: Payer: Self-pay

## 2024-02-24 NOTE — Telephone Encounter (Signed)
 Her insurance denied auth for the procedure she had done on 3/31 because it was charged as self administered. Who can help me with this? It must have been on the coding/billing side.

## 2024-02-28 DIAGNOSIS — J301 Allergic rhinitis due to pollen: Secondary | ICD-10-CM | POA: Diagnosis not present

## 2024-02-29 ENCOUNTER — Other Ambulatory Visit: Payer: Self-pay | Admitting: Family

## 2024-02-29 ENCOUNTER — Encounter: Payer: Self-pay | Admitting: Family

## 2024-02-29 DIAGNOSIS — J439 Emphysema, unspecified: Secondary | ICD-10-CM

## 2024-03-06 DIAGNOSIS — J301 Allergic rhinitis due to pollen: Secondary | ICD-10-CM | POA: Diagnosis not present

## 2024-03-09 ENCOUNTER — Telehealth: Payer: Self-pay | Admitting: *Deleted

## 2024-03-09 ENCOUNTER — Encounter: Payer: Self-pay | Admitting: Student in an Organized Health Care Education/Training Program

## 2024-03-09 NOTE — Telephone Encounter (Signed)
 Concerned because one of the charges on her bill is not covered by insurance because it is a "self administered drug". Can you help address this?

## 2024-03-13 ENCOUNTER — Ambulatory Visit
Attending: Student in an Organized Health Care Education/Training Program | Admitting: Student in an Organized Health Care Education/Training Program

## 2024-03-13 DIAGNOSIS — M5412 Radiculopathy, cervical region: Secondary | ICD-10-CM

## 2024-03-13 DIAGNOSIS — M65331 Trigger finger, right middle finger: Secondary | ICD-10-CM | POA: Diagnosis not present

## 2024-03-13 DIAGNOSIS — Z981 Arthrodesis status: Secondary | ICD-10-CM | POA: Diagnosis not present

## 2024-03-13 DIAGNOSIS — M65332 Trigger finger, left middle finger: Secondary | ICD-10-CM | POA: Diagnosis not present

## 2024-03-13 DIAGNOSIS — G894 Chronic pain syndrome: Secondary | ICD-10-CM | POA: Diagnosis not present

## 2024-03-13 DIAGNOSIS — J301 Allergic rhinitis due to pollen: Secondary | ICD-10-CM | POA: Diagnosis not present

## 2024-03-13 NOTE — Progress Notes (Signed)
 PROVIDER NOTE: Interpretation of information contained herein should be left to medically-trained personnel. Specific patient instructions are provided elsewhere under "Patient Instructions" section of medical record. This document was created in part using AI and STT-dictation technology, any transcriptional errors that may result from this process are unintentional.  Patient: Heather Beasley  Service: E/M   PCP: Felicita Horns, FNP  DOB: [birth year]  DOS: 03/13/2024  Provider: Cephus Collin, MD  MRN: [id number]  Delivery: Virtual Visit  Specialty: Interventional Pain Management  Type: Established Patient  Setting: Ambulatory outpatient facility  Specialty designation: 09  Referring Prov.: Felicita Horns, FNP  Location: Remote location       Virtual Encounter - Pain Management PROVIDER NOTE: Information contained herein reflects review and annotations entered in association with encounter. Interpretation of such information and data should be left to medically-trained personnel. Information provided to patient can be located elsewhere in the medical record under "Patient Instructions". Document created using STT-dictation technology, any transcriptional errors that may result from process are unintentional.    Contact & Pharmacy Preferred: [phone] Home: [phone] (home) Mobile: [phone] (mobile) E-mail: [email or web address]  [facility] - [place], [place] - [address] [place] [address] Phone: [phone] Fax: [phone]   Pre-screening  Heather Beasley offered "in-person" vs "virtual" encounter. She indicated preferring virtual for this encounter.   Reason COVID-19*  Social distancing based on CDC and AMA recommendations.   I contacted Heather Beasley on 03/13/2024 via telephone.      I clearly identified myself as Cephus Collin, MD. I verified that I was speaking with the correct person using two identifiers (Name: Heather Beasley, and date of  birth: [birth year]).  Consent I sought verbal advanced consent from Heather Beasley for virtual visit interactions. I informed Heather Beasley of possible security and privacy concerns, risks, and limitations associated with providing "not-in-person" medical evaluation and management services. I also informed Heather Beasley of the availability of "in-person" appointments. Finally, I informed her that there would be a charge for the virtual visit and that she could be  personally, fully or partially, financially responsible for it. Heather Beasley expressed understanding and agreed to proceed.   Historic Elements   Heather Beasley is a 68 y.o. year old, female patient evaluated today after our last contact on 02/14/2024. 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), Cervical spondylosis, Colitis, Colon polyps, COPD (chronic obstructive pulmonary disease) (HCC), Coronary artery disease, DDD (degenerative disc disease), cervical, Depression, Diastolic dysfunction, Diverticulosis, GERD (gastroesophageal reflux disease), Glaucoma, Headaches, cluster, Heart murmur, HTN (hypertension), Hyperlipidemia, IBS (irritable bowel syndrome), Kidney stones, Long term current use of antithrombotics/antiplatelets, Lumbar adjacent segment disease with spondylolisthesis, Melanoma (HCC) (1995), Neuropathy, Neuropathy of left lower extremity, Nose colonized with MRSA (02/16/2023), PFO (patent foramen ovale), Pneumonia, PONV (postoperative nausea and vomiting), and Tobacco abuse. She also  has a past surgical history that includes Appendectomy (1998); Abdominal hysterectomy (1998); Lumbar spine surgery (07/2004); Vaginal delivery; Cervical fusion (2010); Cholecystectomy (08/2011); Esophagogastroduodenoscopy (2012); Colonoscopy (N/A); Tonsillectomy (1968); Cystoscopy (N/A); Back surgery;  Dilation and curettage of uterus; Fracture surgery (2001); Image guided sinus surgery (N/A, 06/25/2015); Polypectomy (N/A, 06/25/2015); Oophorectomy; CAROTID PTA/STENT INTERVENTION (Left, 06/27/2020); Cataract extraction; Anterior lumbar fusion (N/A, 03/01/2023); and Application of intraoperative CT scan (N/A, 03/01/2023). Heather Beasley has a current medication list which includes the following prescription(s): acetaminophen , albuterol , atorvastatin , benzonatate , breztri   aerosphere, cetirizine, vitamin d , clopidogrel , cyanocobalamin , cyclobenzaprine , epinephrine , famotidine , furosemide , losartan , montelukast , multiple vitamins-minerals, oxymetazoline , pantoprazole , terbinafine , terbinafine , and prednisone . She  reports that she has been smoking e-cigarettes and cigarettes. She started smoking about 40 years ago. She has a 39 pack-year smoking history. She has never used smokeless tobacco. She reports that she does not currently use alcohol. She reports that she does not use drugs. Heather Beasley is allergic to albumin (human), amoxicillin-pot clavulanate, gabapentin, latex, betadine [povidone iodine ], sucralfate, amoxicillin, chantix [varenicline tartrate], erythromycin, sucralfate, and tegaderm ag mesh [silver].  BMI: Estimated body mass index is 31.12 kg/m as calculated from the following:   Height as of 02/14/24: 5\' 5"  (1.651 m).   Weight as of 02/14/24: 187 lb (84.8 kg). Last encounter: 12/14/2023. Last procedure: 02/14/2024.  HPI  Today, she is being contacted for a post-procedure assessment.   Post-procedure evaluation     Procedure:          Anesthesia, Analgesia, Anxiolysis:  Type: Trigger Finger Ligament/Tendon sheath (20550) Injection.  #1  Purpose: Diagnostic Target Area: Flexor Digitorum Tendon sheath nodule Region: A-1(proximal) pulley of the metacarpal area Approach: Percutaneous Digit: No:3(Middle) Finger Laterality: Left-hand  Type: Local Anesthesia Local Anesthetic: Lidocaine  1-2% Sedation:  None  Indication(s):  Analgesia Route: Infiltration ([place]/IM) IV Access: N/A   Position: Sitting   1. Acquired trigger finger of both middle fingers   2. Chronic pain syndrome    NAS-11 Pain score:   Pre-procedure: 2/10   Post-procedure: 0-No pain/10    Effectiveness:  Initial hour after procedure: 100 %  Subsequent 4-6 hours post-procedure: 100 %  Analgesia past initial 6 hours: 50 %  Ongoing improvement:  Analgesic:  50% Function: Somewhat improved    Laboratory Chemistry Profile   Renal Lab Results  Component Value Date   BUN 17 01/12/2023   CREATININE 1.04 (H) 01/12/2023   BCR 16 01/12/2023   GFR 74.49 11/24/2022   GFRAA >60 06/28/2020   GFRNONAA >60 12/31/2021    Hepatic Lab Results  Component Value Date   AST 22 02/16/2024   ALT 17 02/16/2024   ALBUMIN 4.3 02/16/2024   ALKPHOS 101 02/16/2024   LIPASE 16 01/04/2012    Electrolytes Lab Results  Component Value Date   NA 143 01/12/2023   K 5.1 01/12/2023   CL 103 01/12/2023   CALCIUM  9.7 01/12/2023    Bone Lab Results  Component Value Date   VD25OH 43.63 08/25/2023    Inflammation (CRP: Acute Phase) (ESR: Chronic Phase) Lab Results  Component Value Date   ESRSEDRATE 2 11/17/2012   LATICACIDVEN 1.0 06/11/2011         Note: Above Lab results reviewed.  Imaging   CT CHEST LUNG CA SCREEN LOW DOSE W/O CM CLINICAL DATA:  68 year old female former smoker with 45.5 pack-year smoking history, quit smoking February 2024. History of melanoma and nasal cavity cancer. * Tracking Code: BO *  EXAM: CT CHEST WITHOUT CONTRAST LOW-DOSE FOR LUNG CANCER SCREENING  TECHNIQUE: Multidetector CT imaging of the chest was performed following the standard protocol without IV contrast.  RADIATION DOSE REDUCTION: This exam was performed according to the departmental dose-optimization program which includes automated exposure control, adjustment of the mA and/or kV according to patient size and/or use of  iterative reconstruction technique.  COMPARISON:  12/31/2023 chest radiograph. 01/15/2023 screening chest CT  FINDINGS: Cardiovascular: Normal heart size. No significant pericardial effusion/thickening. Left anterior descending and left circumflex coronary atherosclerosis. Atherosclerotic nonaneurysmal thoracic aorta. Normal caliber pulmonary arteries.  Mediastinum/Nodes: No significant thyroid  nodules. Unremarkable esophagus. No pathologically enlarged axillary, mediastinal or hilar lymph nodes, noting limited sensitivity for the detection of hilar adenopathy on this noncontrast study.  Lungs/Pleura: No pneumothorax. No pleural effusion. Mild centrilobular emphysema with diffuse bronchial wall thickening. No acute consolidative airspace disease or lung masses. No significant growth of previously visualized pulmonary nodules, largest a ground-glass 9.1 mm left lower lobe nodule in series 3/image 205. No new significant pulmonary nodules.  Upper abdomen: Cholecystectomy.  Musculoskeletal: No aggressive appearing focal osseous lesions. Moderate thoracic spondylosis. Partially visualized surgical hardware from ACDF.  IMPRESSION: 1. Lung-RADS 2, benign appearance or behavior. Continue annual screening with low-dose chest CT without contrast in 12 months. 2. Two-vessel coronary atherosclerosis. 3. Aortic Atherosclerosis (ICD10-I70.0) and Emphysema (ICD10-J43.9).  Electronically Signed   By: Levell Reach M.D.   On: 02/07/2024 17:58  Assessment  The primary encounter diagnosis was Acquired trigger finger of both middle fingers. Diagnoses of Chronic pain syndrome, S/P cervical spinal fusion, and Cervical radicular pain were also pertinent to this visit.  Plan of Care  Patient is experiencing benefit after her trigger finger injection as detailed above.  She is however complaining of increased right arm pain related to cervical radicular exacerbation.  She previously had a right  T1-T2 ESI on 01/24/2024 that provided her with 75% pain relief for approximately 6 weeks and now she is gradually having return of pain and difficulty performing ADLs.  We discussed repeating T1-T2 ESI in 4 to 6 weeks.  I recommend that we monitor her trigger fingers and repeat trigger finger injection if they start to bother her more.  Orders:  Orders Placed This Encounter  Procedures   Cervical Epidural Injection    Sedation: Patient's choice. Purpose: Diagnostic/Therapeutic Indication(s): Radiculitis and cervicalgia associater with cervical degenerative disc disease.    Standing Status:   Future    Expected Date:   04/19/2024    Expiration Date:   06/13/2024    Scheduling Instructions:     Procedure: Cervical Epidural Steroid Injection/Block     Level(s): T1-T2 ESI     Laterality: TBD     Timeframe: As soon as schedule allows.    Where will this procedure be performed?:   [facility]             [facility]   Follow-up plan:   Return in about 4 weeks (around 04/10/2024) for C-ESI's, in clinic NS.      Right T1-2 ESI; left middle finger trigger finger injection 02/14/2024     Recent Visits Date Type Provider Dept  02/14/24 Procedure visit Cephus Collin, MD [facility]  01/24/24 Procedure visit Cephus Collin, MD [facility]  12/14/23 Office Visit Cephus Collin, MD [facility]  Showing recent visits within past 90 days and meeting all other requirements Today's Visits Date Type Provider Dept  03/13/24 Office Visit Cephus Collin, MD [facility]  Showing today's visits and meeting all other requirements Future Appointments No visits were found meeting these conditions. Showing future appointments within next 90 days and meeting all other requirements  I discussed the assessment and treatment plan with the patient. The patient was provided an opportunity to ask questions and all were answered. The patient agreed with the plan and  demonstrated an understanding of the instructions.  Patient advised to call back or seek an in-person evaluation if the symptoms or condition worsens.  Duration of encounter: 15 minutes.  Note by: Cephus Collin, MD Date: 03/13/2024;  Time: 11:22 AM

## 2024-03-14 ENCOUNTER — Telehealth: Payer: Self-pay

## 2024-03-14 ENCOUNTER — Encounter (INDEPENDENT_AMBULATORY_CARE_PROVIDER_SITE_OTHER): Payer: Self-pay

## 2024-03-14 NOTE — Telephone Encounter (Signed)
 Scheduled Cesi for 6/16. She has questions about her blood thinners

## 2024-03-14 NOTE — Telephone Encounter (Signed)
 Attempted to call patient back regarding blood thinners.  Call could not be completed at this time. Unable to leave a message.

## 2024-03-15 ENCOUNTER — Encounter: Payer: Self-pay | Admitting: Podiatry

## 2024-03-15 ENCOUNTER — Ambulatory Visit (INDEPENDENT_AMBULATORY_CARE_PROVIDER_SITE_OTHER)

## 2024-03-15 ENCOUNTER — Telehealth: Payer: Self-pay

## 2024-03-15 ENCOUNTER — Ambulatory Visit: Admitting: Podiatry

## 2024-03-15 VITALS — Ht 65.0 in | Wt 187.0 lb

## 2024-03-15 DIAGNOSIS — Z1231 Encounter for screening mammogram for malignant neoplasm of breast: Secondary | ICD-10-CM

## 2024-03-15 DIAGNOSIS — M722 Plantar fascial fibromatosis: Secondary | ICD-10-CM | POA: Diagnosis not present

## 2024-03-15 DIAGNOSIS — Z0000 Encounter for general adult medical examination without abnormal findings: Secondary | ICD-10-CM | POA: Diagnosis not present

## 2024-03-15 NOTE — Patient Instructions (Signed)
 [name] , Thank you for taking time out of your busy schedule to complete your Annual Wellness Visit with me. I enjoyed our conversation and look forward to speaking with you again next year. I, as well as your care team,  appreciate your ongoing commitment to your health goals. Please review the following plan we discussed and let me know if I can assist you in the future. Your Game plan/ To Do List    Referrals: If you haven't heard from the office you've been referred to, please reach out to them at the phone provided.  You have an order for:  []   2D Mammogram  [x]   3D Mammogram  []   Bone Density     Please call for appointment:  [facility]  [address] [place] [address] [phone]    Make sure to wear two-piece clothing.  No lotions, powders, or deodorants the day of the appointment. Make sure to bring picture ID and insurance card.  Bring list of medications you are currently taking including any supplements.   Follow up Visits: Next Medicare AWV with our clinical staff: 03/16/25 @ 8:10am televisit   Have you seen your provider in the last 6 months (3 months if uncontrolled diabetes)? No Next Office Visit with your provider: 03/16/24 OV- 06/14/24 PE  Clinician Recommendations:  Aim for 30 minutes of exercise or brisk walking, 6-8 glasses of water , and 5 servings of fruits and vegetables each day.       This is a list of the screening recommended for you and due dates:  Health Maintenance  Topic Date Due   COVID-19 Vaccine (1) Never done   HIV Screening  Never done   Hepatitis C Screening  Never done   Zoster (Shingles) Vaccine (1 of 2) Never done   Pneumonia Vaccine (3 of 3 - PCV20 or PCV21) 03/02/2023   Flu Shot  05/26/2024   Screening for Lung Cancer  01/12/2025   Medicare Annual Wellness Visit  03/15/2025   Mammogram  04/13/2025   Colon Cancer Screening  05/02/2030   DEXA scan (bone density measurement)   Completed   HPV Vaccine  Aged Out   Meningitis B Vaccine  Aged Out   DTaP/Tdap/Td vaccine  Discontinued    Advanced directives: (Declined) Advance directive discussed with you today. Even though you declined this today, please call our office should you change your mind, and we can give you the proper paperwork for you to fill out. Advance Care Planning is important because it:  [x]  Makes sure you receive the medical care that is consistent with your values, goals, and preferences [x]  It provides guidance to your family and loved ones and reduces their decisional burden about whether or not they are making the right decisions based on your wishes.  Follow the link provided in your after visit summary or read over the paperwork we have mailed to you to help you started getting your Advance Directives in place. If you need assistance in completing these, please reach out to us  so that we can help you!

## 2024-03-15 NOTE — Progress Notes (Signed)
 Please attest and cosign this visit due to patients primary care provider not being in the office at the time the visit was completed.    Subjective:   Heather Beasley is a 68 y.o. who presents for a Medicare Wellness preventive visit.  As a reminder, Annual Wellness Visits don't include a physical exam, and some assessments may be limited, especially if this visit is performed virtually. We may recommend an in-person follow-up visit with your provider if needed.  Visit Complete: Virtual I connected with  FABLE HUISMAN on 03/15/24 by a audio enabled telemedicine application and verified that I am speaking with the correct person using two identifiers.  Patient Location: Home  Provider Location: Office/Clinic  I discussed the limitations of evaluation and management by telemedicine. The patient expressed understanding and agreed to proceed.  Vital Signs: Because this visit was a virtual/telehealth visit, some criteria may be missing or patient reported. Any vitals not documented were not able to be obtained and vitals that have been documented are patient reported.  VideoDeclined- This patient declined Librarian, academic. Therefore the visit was completed with audio only.  Persons Participating in Visit: Patient.  AWV Questionnaire: No: Patient Medicare AWV questionnaire was not completed prior to this visit.  Cardiac Risk Factors include: advanced age (>13men, >17 women);dyslipidemia;hypertension;obesity (BMI >30kg/m2);sedentary lifestyle;Other (see comment)     Objective:     Today's Vitals   03/15/24 0809 03/15/24 0812  Weight: 187 lb (84.8 kg)   Height: 5\' 5"  (1.651 m)   PainSc:  4    Body mass index is 31.12 kg/m.     03/15/2024    8:38 AM 02/14/2024   11:11 AM 01/24/2024   10:30 AM 12/14/2023    9:04 AM 03/01/2023   11:20 AM 02/25/2023    9:40 AM 02/16/2023   10:35 AM  Advanced Directives  Does Patient Have a Medical Advance Directive? No  No No No No No No  Would patient like information on creating a medical advance directive?   No - Patient declined No - Patient declined No - Patient declined No - Patient declined     Current Medications (verified) Outpatient Encounter Medications as of 03/15/2024  Medication Sig   acetaminophen  (TYLENOL ) 650 MG CR tablet Take 650 mg by mouth every 8 (eight) hours as needed for pain.   albuterol  (VENTOLIN  HFA) 108 (90 Base) MCG/ACT inhaler Inhale 1-2 puffs into the lungs every 6 (six) hours as needed (for cough).   atorvastatin  (LIPITOR) 20 MG tablet TAKE ONE TABLET BY MOUTH ONCE A DAY   Budeson-Glycopyrrol-Formoterol  (BREZTRI  AEROSPHERE) 160-9-4.8 MCG/ACT AERO Inhale 2 puffs into the lungs in the morning and at bedtime.   cetirizine (ZYRTEC) 10 MG tablet Take 10 mg by mouth daily. Allergies.   Cholecalciferol (VITAMIN D ) 50 MCG (2000 UT) CAPS Take 2,000 Units by mouth.   clopidogrel  (PLAVIX ) 75 MG tablet Take 1 tablet (75 mg total) by mouth daily.   cyanocobalamin  (VITAMIN B12) 1000 MCG tablet Take 1,000 mcg by mouth every other day.   cyclobenzaprine  (FLEXERIL ) 10 MG tablet TAKE ONE TABLET (10 MG TOTAL) BY MOUTH THREE TIMES DAILY AS NEEDED FOR MUSCLE SPASMS.   EPINEPHrine  0.3 mg/0.3 mL IJ SOAJ injection Inject 1 mg into the skin as needed.   Famotidine  (PEPCID  PO) Take 1 tablet by mouth at bedtime.   furosemide  (LASIX ) 20 MG tablet Take 1 tablet (20 mg total) by mouth as needed for fluid.   losartan  (COZAAR ) 50  MG tablet Take 1 tablet (50 mg total) by mouth daily.   montelukast  (SINGULAIR ) 10 MG tablet TAKE ONE TABLET BY MOUTH AT BEDTIME   Multiple Vitamins-Minerals (CENTRUM ADULTS PO) Take 1 tablet by mouth.   oxymetazoline  (AFRIN) 0.05 % nasal spray Place 1 spray into both nostrils 2 (two) times daily as needed for congestion. Use for nose bleeds   pantoprazole  (PROTONIX ) 40 MG tablet Take 1 tablet (40 mg total) by mouth daily.   terbinafine  (LAMISIL ) 250 MG tablet Take 1 tablet (250 mg  total) by mouth daily.   terbinafine  (LAMISIL ) 250 MG tablet Take 1 tablet (250 mg total) by mouth daily.   benzonatate  (TESSALON ) 200 MG capsule Take 200 mg by mouth 3 (three) times daily as needed for cough. (Patient not taking: Reported on 03/15/2024)   predniSONE  (STERAPRED UNI-PAK 21 TAB) 10 MG (21) TBPK tablet Take as directed (Patient not taking: Reported on 02/16/2024)   No facility-administered encounter medications on file as of 03/15/2024.    Allergies (verified) Albumin (human), Amoxicillin-pot clavulanate, Gabapentin, Latex, Betadine [povidone iodine ], Sucralfate, Amoxicillin, Chantix [varenicline tartrate], Erythromycin, Sucralfate, and Tegaderm ag mesh [silver]   History: 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; T1 N0 M0; s/p adjuvant XRT; did not require systemic chemotherapy   Cervical spondylosis    Colitis    Colon polyps    COPD (chronic obstructive pulmonary disease) (HCC)    Coronary artery disease    a.) cCTA 01/26/2022: Ca score 80.3 (82nd percentile for age/sex match control)   DDD (degenerative disc disease), cervical    a.) ACDF C3-C6   Depression    Diastolic dysfunction    a.) TTE 06/30/2013: EF 55-6%, mild LAE, mild MR, G1DD; b.) TTE 01/21/2022: EF 70-75%, mod LVH, mild MR, G1DD   Diverticulosis    GERD (gastroesophageal reflux disease)    Glaucoma    she reports that she does not have glacoma, she has thick corneas   Headaches, cluster    Heart murmur    HTN (hypertension)    Hyperlipidemia    IBS (irritable bowel  syndrome)    Kidney stones    Long term current use of antithrombotics/antiplatelets    a.) clopidogrel    Lumbar adjacent segment disease with spondylolisthesis    Melanoma (HCC) 1995   above the L knee    Neuropathy    Neuropathy of left lower extremity    Nose colonized with MRSA 02/16/2023   a.) surgical PCR (+) 02/16/2023 prior to L3-4 LATERAL LUMBAR INTERBODY FUSION; L3-S1 POSTERIOR SPINAL FUSION   PFO (patent foramen ovale)    a.) cCTA 01/26/2022 --> PFO with L to R contrast flow   Pneumonia    PONV (postoperative nausea and vomiting)    2012 - during lumbar fusion she developed spinal fluid leak, and had to have 3 units on Blood, BP dropped, and in ICU,   Tobacco abuse    Past Surgical History:  Procedure Laterality Date   ABDOMINAL HYSTERECTOMY  1998   ANTERIOR LUMBAR FUSION N/A 03/01/2023   Procedure:  L3-4 LATERAL LUMBAR INTERBODY FUSION;  Surgeon: Jodeen Munch, MD;  Location: [facility];  Service: Neurosurgery;  Laterality: N/A;   APPENDECTOMY  1998   APPLICATION OF INTRAOPERATIVE CT SCAN N/A 03/01/2023   Procedure: APPLICATION OF INTRAOPERATIVE CT SCAN;  Surgeon: Jodeen Munch, MD;  Location: [facility];  Service: Neurosurgery;  Laterality: N/A;   BACK SURGERY     CAROTID PTA/STENT INTERVENTION Left 06/27/2020   Procedure: CAROTID PTA/STENT INTERVENTION;  Surgeon: Celso College, MD;  Location: [facility];  Service: Cardiovascular;  Laterality: Left;   CATARACT EXTRACTION     CERVICAL FUSION  2010   C3-C6   CHOLECYSTECTOMY  08/2011   Procedure: Cholecystectomy; Location: [facility]; Surgeon: Hortensia Ma, MD   COLONOSCOPY N/A    Procedure: COLONOSCOPY; Location: [facility]; Surgeon: Jace Martinet, MD   CYSTOSCOPY N/A    Procedure: CYSTOSCOPY; Location: [facility]; Surgeon: Darlynn Elam, MD   DILATION AND CURETTAGE OF UTERUS     ESOPHAGOGASTRODUODENOSCOPY  2012   Procedure: ESOPHAGOGASTRODUODENOSCOPY; Location: [facility]; Surgeon: Jace Martinet, MD   FRACTURE SURGERY  2001    plate in right wrist and arm   IMAGE GUIDED SINUS SURGERY N/A 06/25/2015   Procedure: IMAGE GUIDED SINUS SURGERY;  Surgeon: Lesly Raspberry, MD;  Location: [facility];  Service: ENT;  Laterality: N/A;   LUMBAR SPINE SURGERY  07/2004   OOPHORECTOMY     POLYPECTOMY N/A 06/25/2015   Procedure: POLYPECTOMY NASAL;  Surgeon: Lesly Raspberry, MD;  Location: [facility];  Service: ENT;  Laterality: N/A;   TONSILLECTOMY  1968   VAGINAL DELIVERY     x2   Family History  Problem Relation Age of Onset   Hypertension Mother    Hyperlipidemia Mother    Diabetes Mother    Colon cancer Father    Pancreatic cancer Father    Cancer Father        Colon & Pancreatic   Breast cancer Paternal Grandmother 85   Diabetes Paternal Grandmother    Coronary artery disease Paternal Grandmother    Heart failure Paternal Grandmother    Cancer Paternal Grandmother        breast   Stroke Brother    Social History   Socioeconomic History   Marital status: Married    Spouse name: Gwinda Leopard   Number of children: 2   Years of education: Not on file   Highest education level: Not on file  Occupational History   Occupation: Conservation officer, nature   Occupation: disabled  Tobacco Use   Smoking status: Every Day    Current packs/day: 0.00    Average packs/day: 1 pack/day for 39.0 years (39.0 ttl pk-yrs)    Types: E-cigarettes, Cigarettes    Start date: 12/20/1983    Last attempt to quit: 12/19/2022    Years since quitting: 1.2   Smokeless tobacco: Never   Tobacco comments:    Using vape with no nicotine    Vaping Use   Vaping status: Every Day   Substances: Flavoring  Substance and Sexual Activity   Alcohol use: Not Currently   Drug use: No   Sexual activity: Not Currently    Partners: Male  Other Topics Concern   Not on file  Social History Narrative   Lives in [place] with husband. Dog in home. Work - disabled for neck and back pain.   Social Drivers of Corporate investment banker Strain: Low Risk  (03/15/2024)    Overall Financial Resource Strain (CARDIA)    Difficulty of Paying Living Expenses: Not hard  at all  Food Insecurity: No Food Insecurity (03/15/2024)   Hunger Vital Sign    Worried About Running Out of Food in the Last Year: Never true    Ran Out of Food in the Last Year: Never true  Transportation Needs: No Transportation Needs (03/15/2024)   PRAPARE - Administrator, Civil Service (Medical): No    Lack of Transportation (Non-Medical): No  Physical Activity: Inactive (03/15/2024)   Exercise Vital Sign    Days of Exercise per Week: 0 days    Minutes of Exercise per Session: 0 min  Stress: No Stress Concern Present (03/15/2024)   [employer] of Occupational Health - Occupational Stress Questionnaire    Feeling of Stress : Not at all  Social Connections: Moderately Isolated (03/15/2024)   Social Connection and Isolation Panel [NHANES]    Frequency of Communication with Friends and Family: More than three times a week    Frequency of Social Gatherings with Friends and Family: Twice a week    Attends Religious Services: Never    Database administrator or Organizations: No    Attends Engineer, structural: Never    Marital Status: Married    Tobacco Counseling Ready to quit: Not Answered Counseling given: Not Answered Tobacco comments: Using vape with no nicotine      Clinical Intake:  Pre-visit preparation completed: Yes  Pain : 0-10 Pain Score: 4  Pain Type: Chronic pain Pain Location: Back Pain Orientation: Mid Pain Descriptors / Indicators: Aching, Dull Pain Onset: More than a month ago Pain Frequency: Constant Pain Relieving Factors: rest, ice, flexeril , XS Tylenol  Effect of Pain on Daily Activities: limits activities  Pain Relieving Factors: rest, ice, flexeril , XS Tylenol   BMI - recorded: 31.1 Nutritional Status: BMI > 30  Obese Nutritional Risks: None Diabetes: No  Lab Results  Component Value Date   HGBA1C 6.1 08/25/2023     How  often do you need to have someone help you when you read instructions, pamphlets, or other written materials from your doctor or pharmacy?: 1 - Never  Interpreter Needed?: No  Comments: lives with husband Information entered by :: B.Alder Murri,LPN   Activities of Daily Living     03/15/2024    8:38 AM  In your present state of health, do you have any difficulty performing the following activities:  Hearing? 0  Vision? 0  Difficulty concentrating or making decisions? 0  Walking or climbing stairs? 0  Dressing or bathing? 0  Doing errands, shopping? 0  Preparing Food and eating ? N  Using the Toilet? N  In the past six months, have you accidently leaked urine? Y  Do you have problems with loss of bowel control? Y  Managing your Medications? N  Managing your Finances? N  Housekeeping or managing your Housekeeping? N    Patient Care Team: Felicita Horns, FNP as PCP - General (Family Medicine) Odie Benne, MD as PCP - Cardiology (Cardiology) Glenis Langdon, MD as Referring Physician (Radiation Oncology) Lesly Raspberry, MD (Otolaryngology) Jodeen Munch, MD as Consulting Physician (Neurosurgery) Valene Gash, NP as Nurse Practitioner (Vascular Surgery) Nadia Aurora, MD (Inactive) as Consulting Physician (Pulmonary Disease) Pa, [facility] Jacinta Martinis, MD as Consulting Physician (Ophthalmology)  Indicate any recent Medical Services you may have received from other than [facility] providers in the past year (date may be approximate).     Assessment:    This is a routine wellness examination for Deirdra.  Hearing/Vision screen Hearing Screening -  Comments:: Pt says her hearing is good Vision Screening - Comments:: Pt says her vision is alright:had cataract surgery;readers only Dr Demetrios Finders    Goals Addressed             This Visit's Progress    Healthy Lifestyle   On track    03/15/24 Stay hydrated, drink plenty of water  Make healthy  choices when eating Start the [facility] with the [facility] not do this-no longer a goal       Depression Screen     03/15/2024    8:32 AM 02/14/2024   11:11 AM 01/24/2024   10:30 AM 12/14/2023    9:04 AM 08/25/2023   10:00 AM 06/15/2023   11:07 AM 03/08/2023    9:05 AM  PHQ 2/9 Scores  PHQ - 2 Score 0 1 1 1 1 2 3   PHQ- 9 Score     6 8 8     Fall Risk     03/15/2024    8:20 AM 02/14/2024   11:10 AM 01/24/2024   10:30 AM 12/14/2023    9:04 AM 08/25/2023   10:00 AM  Fall Risk   Falls in the past year? 1 1 1 1  0  Number falls in past yr: 0 1 1 1  0  Injury with Fall? 0 0 0 0 0  Risk for fall due to : No Fall Risks;Impaired balance/gait History of fall(s);Impaired balance/gait History of fall(s)  No Fall Risks  Risk for fall due to: Comment pt says has neuropathy in feet      Follow up Education provided;Falls prevention discussed Falls evaluation completed;Education provided   Falls evaluation completed    MEDICARE RISK AT HOME:  Medicare Risk at Home Any stairs in or around the home?: Yes (ramp) If so, are there any without handrails?: Yes Home free of loose throw rugs in walkways, pet beds, electrical cords, etc?: Yes Adequate lighting in your home to reduce risk of falls?: Yes Life alert?: No Use of a cane, walker or w/c?: No Grab bars in the bathroom?: Yes Shower chair or bench in shower?: Yes Elevated toilet seat or a handicapped toilet?: Yes  TIMED UP AND GO:  Was the test performed?  No  Cognitive Function: 6CIT completed    11/14/2015   10:02 AM  MMSE - Mini Mental State Exam  Orientation to time 5  Orientation to Place 5  Registration 3  Attention/ Calculation 5  Recall 3  Language- name 2 objects 2  Language- repeat 1  Language- follow 3 step command 3  Language- read & follow direction 1  Write a sentence 1  Copy design 1  Total score 30        03/15/2024    8:41 AM 02/25/2023    9:44 AM 03/04/2022   12:10 PM  6CIT Screen  What Year? 0  points 0 points 0 points  What month? 0 points 0 points 0 points  What time? 0 points 0 points 0 points  Count back from 20 0 points 0 points 0 points  Months in reverse 0 points 0 points 0 points  Repeat phrase 0 points 0 points 0 points  Total Score 0 points 0 points 0 points    Immunizations Immunization History  Administered Date(s) Administered   Influenza Split 07/27/2011, 08/25/2012   Influenza Whole 09/14/2007   Influenza,inj,Quad PF,6+ Mos 08/10/2013, 08/01/2015, 07/16/2016, 08/02/2018, 07/15/2019   Influenza,inj,quad, With Preservative 08/13/2017   Influenza-Unspecified 07/26/2021, 08/05/2022, 07/29/2023   Pneumococcal Conjugate-13  07/16/2016   Pneumococcal Polysaccharide-23 11/27/2011, 03/01/2018   Respiratory Syncytial Virus Vaccine,Recomb Aduvanted(Arexvy) 08/26/2022    Screening Tests Health Maintenance  Topic Date Due   COVID-19 Vaccine (1) Never done   HIV Screening  Never done   Hepatitis C Screening  Never done   Zoster Vaccines- Shingrix (1 of 2) Never done   Pneumonia Vaccine 43+ Years old (3 of 3 - PCV20 or PCV21) 03/02/2023   INFLUENZA VACCINE  05/26/2024   Lung Cancer Screening  01/12/2025   Medicare Annual Wellness (AWV)  03/15/2025   MAMMOGRAM  04/13/2025   Colonoscopy  05/02/2030   DEXA SCAN  Completed   HPV VACCINES  Aged Out   Meningococcal B Vaccine  Aged Out   DTaP/Tdap/Td  Discontinued    Health Maintenance  Health Maintenance Due  Topic Date Due   COVID-19 Vaccine (1) Never done   HIV Screening  Never done   Hepatitis C Screening  Never done   Zoster Vaccines- Shingrix (1 of 2) Never done   Pneumonia Vaccine 5+ Years old (3 of 3 - PCV20 or PCV21) 03/02/2023   Health Maintenance Items Addressed: Mammogram ordered Vaccinations: pt declines: Shingles vaccine: recommend Shingrix which is 2 doses 2-6 months apart and over 90% effective     Covid-19: recommend 2 doses one month apart with a booster 6 months later   Additional  Screening:  Vision Screening: Recommended annual ophthalmology exams for early detection of glaucoma and other disorders of the eye.  Dental Screening: Recommended annual dental exams for proper oral hygiene  Community Resource Referral / Chronic Care Management: CRR required this visit?  No   CCM required this visit?  No   Plan:    I have personally reviewed and noted the following in the patient's chart:   Medical and social history Use of alcohol, tobacco or illicit drugs  Current medications and supplements including opioid prescriptions. Patient is not currently taking opioid prescriptions. Functional ability and status Nutritional status Physical activity Advanced directives List of other physicians Hospitalizations, surgeries, and ER visits in previous 12 months Vitals Screenings to include cognitive, depression, and falls Referrals and appointments  In addition, I have reviewed and discussed with patient certain preventive protocols, quality metrics, and best practice recommendations. A written personalized care plan for preventive services as well as general preventive health recommendations were provided to patient.   Nerissa Bannister, LPN   04/15/3085   After Visit Summary: (MyChart) Due to this being a telephonic visit, the after visit summary with patients personalized plan was offered to patient via MyChart   Notes: Please refer to Routing Comments.

## 2024-03-15 NOTE — Telephone Encounter (Signed)
 Clarified with patient that she needed to stop the Plavix  7 days prior to procedure.

## 2024-03-15 NOTE — Progress Notes (Signed)
 She presents today for follow-up of her plantar fasciitis she states that she is doing much better.  Objective: Vital signs are stable oriented x 3.  Pulses palpable.  No reproducible pain on palpation of the plantar fascia.  Assessment: Well-healing plantar fasciitis.  Plan: Follow-up with us  on an as-needed basis continue conservative shoe gear.

## 2024-03-16 ENCOUNTER — Encounter: Payer: Self-pay | Admitting: Family

## 2024-03-16 ENCOUNTER — Ambulatory Visit: Admitting: Family

## 2024-03-16 VITALS — BP 142/82 | HR 83 | Temp 98.1°F | Ht 65.0 in | Wt 181.2 lb

## 2024-03-16 DIAGNOSIS — E538 Deficiency of other specified B group vitamins: Secondary | ICD-10-CM | POA: Diagnosis not present

## 2024-03-16 DIAGNOSIS — R040 Epistaxis: Secondary | ICD-10-CM | POA: Diagnosis not present

## 2024-03-16 DIAGNOSIS — I10 Essential (primary) hypertension: Secondary | ICD-10-CM

## 2024-03-16 DIAGNOSIS — Z760 Encounter for issue of repeat prescription: Secondary | ICD-10-CM | POA: Insufficient documentation

## 2024-03-16 DIAGNOSIS — R7303 Prediabetes: Secondary | ICD-10-CM | POA: Insufficient documentation

## 2024-03-16 DIAGNOSIS — K2100 Gastro-esophageal reflux disease with esophagitis, without bleeding: Secondary | ICD-10-CM | POA: Diagnosis not present

## 2024-03-16 DIAGNOSIS — E7800 Pure hypercholesterolemia, unspecified: Secondary | ICD-10-CM | POA: Diagnosis not present

## 2024-03-16 LAB — LIPID PANEL
Cholesterol: 129 mg/dL (ref 0–200)
HDL: 64.4 mg/dL (ref >39.00)
LDL Cholesterol: 50 mg/dL (ref 0–99)
NonHDL: 64.23
Total CHOL/HDL Ratio: 2
Triglycerides: 70 mg/dL (ref 0.0–149.0)
VLDL: 14 mg/dL (ref 0.0–40.0)

## 2024-03-16 LAB — HEMOGLOBIN A1C: Hgb A1c MFr Bld: 6 % (ref 4.6–6.5)

## 2024-03-16 LAB — COMPREHENSIVE METABOLIC PANEL WITH GFR
ALT: 13 U/L (ref 0–35)
AST: 22 U/L (ref 0–37)
Albumin: 4.4 g/dL (ref 3.5–5.2)
Alkaline Phosphatase: 89 U/L (ref 39–117)
BUN: 17 mg/dL (ref 6–23)
CO2: 34 meq/L — ABNORMAL HIGH (ref 19–32)
Calcium: 9.9 mg/dL (ref 8.4–10.5)
Chloride: 104 meq/L (ref 96–112)
Creatinine, Ser: 1.03 mg/dL (ref 0.40–1.20)
GFR: 56.96 mL/min — ABNORMAL LOW (ref >60.00)
Glucose, Bld: 87 mg/dL (ref 70–99)
Potassium: 5.4 meq/L — ABNORMAL HIGH (ref 3.5–5.1)
Sodium: 142 meq/L (ref 135–145)
Total Bilirubin: 0.7 mg/dL (ref 0.2–1.2)
Total Protein: 6.9 g/dL (ref 6.0–8.3)

## 2024-03-16 LAB — MICROALBUMIN / CREATININE URINE RATIO
Creatinine,U: 52 mg/dL
Microalb Creat Ratio: UNDETERMINED mg/g (ref 0.0–30.0)
Microalb, Ur: <0.7 mg/dL

## 2024-03-16 LAB — VITAMIN B12: Vitamin B-12: >1500 pg/mL — ABNORMAL HIGH (ref 211–911)

## 2024-03-16 MED ORDER — PANTOPRAZOLE SODIUM 40 MG PO TBEC: 40.0000 mg | DELAYED_RELEASE_TABLET | Freq: Every day | ORAL | 3 refills | Status: AC

## 2024-03-16 NOTE — Assessment & Plan Note (Signed)
Pt advised of the following: Work on a diabetic diet, try to incorporate exercise at least 20-30 a day for 3 days a week or more.  A1c ordered today pending results.

## 2024-03-16 NOTE — Assessment & Plan Note (Signed)
 Discussed long term PPI therapy  Continue rx pantoprazole  40 mg once daily.  Try to decrease and or avoid spicy foods, fried fatty foods, and also caffeine and chocolate as these can increase heartburn symptoms.  Reviewed EGD and colonoscopy

## 2024-03-16 NOTE — Assessment & Plan Note (Signed)
 Continue atorvastatin 20 mg nightly  Ordered lipid panel, pending results. Work on low cholesterol diet and exercise as tolerated

## 2024-03-16 NOTE — Progress Notes (Signed)
 Established Patient Office Visit  Subjective:   Patient ID: Heather Beasley, female    DOB: October 09, 1956  Age: 68 y.o. MRN: [id number]  CC:  Chief Complaint  Patient presents with   Medical Management of Chronic Issues    HPI: Heather Beasley is a 68 y.o. female presenting on 03/16/2024 for Medical Management of Chronic Issues  Colonoscopy last 05/02/20, diverticulosis, 2-4 mm polyps, pathology with chronic gastritis, path with hyperplastic polyp  Q5 years will be due in 2026.  GERD, she has between seeing GI but has not seen them since 08/2022. She is doing well and stable on pantoprazole  40 mg once daily. 7/21 EGD normal no barrets.   HTN: notices on average at home 140/150 systolic. She is on losartan  50 mg once daily.  Does see cardiology annually, last visit 07/2023. asymptomatic    ROS: Negative unless specifically indicated above in HPI.   Relevant past medical history reviewed and updated as indicated.   Allergies and medications reviewed and updated.   Current Outpatient Medications:    acetaminophen  (TYLENOL ) 650 MG CR tablet, Take 650 mg by mouth every 8 (eight) hours as needed for pain., Disp: , Rfl:    albuterol  (VENTOLIN  HFA) 108 (90 Base) MCG/ACT inhaler, Inhale 1-2 puffs into the lungs every 6 (six) hours as needed (for cough)., Disp: 18 g, Rfl: 2   atorvastatin  (LIPITOR) 20 MG tablet, TAKE ONE TABLET BY MOUTH ONCE A DAY, Disp: 90 tablet, Rfl: 3   Budeson-Glycopyrrol-Formoterol  (BREZTRI  AEROSPHERE) 160-9-4.8 MCG/ACT AERO, Inhale 2 puffs into the lungs in the morning and at bedtime., Disp: 11.8 g, Rfl: 0   cetirizine (ZYRTEC) 10 MG tablet, Take 10 mg by mouth daily. Allergies., Disp: , Rfl:    Cholecalciferol (VITAMIN D ) 50 MCG (2000 UT) CAPS, Take 2,000 Units by mouth., Disp: , Rfl:    clopidogrel  (PLAVIX ) 75 MG tablet, Take 1 tablet (75 mg total) by mouth daily., Disp: 90 tablet, Rfl: 3   cyanocobalamin  (VITAMIN B12) 1000 MCG tablet, Take 1,000 mcg by mouth every  other day., Disp: , Rfl:    cyclobenzaprine  (FLEXERIL ) 10 MG tablet, TAKE ONE TABLET (10 MG TOTAL) BY MOUTH THREE TIMES DAILY AS NEEDED FOR MUSCLE SPASMS., Disp: 90 tablet, Rfl: 0   EPINEPHrine  0.3 mg/0.3 mL IJ SOAJ injection, Inject 1 mg into the skin as needed., Disp: , Rfl:    furosemide  (LASIX ) 20 MG tablet, Take 1 tablet (20 mg total) by mouth as needed for fluid., Disp: 30 tablet, Rfl: 0   losartan  (COZAAR ) 50 MG tablet, Take 1 tablet (50 mg total) by mouth daily., Disp: 90 tablet, Rfl: 1   montelukast  (SINGULAIR ) 10 MG tablet, TAKE ONE TABLET BY MOUTH AT BEDTIME, Disp: 90 tablet, Rfl: 3   Multiple Vitamins-Minerals (CENTRUM ADULTS PO), Take 1 tablet by mouth., Disp: , Rfl:    nystatin  (MYCOSTATIN ) 100000 UNIT/ML suspension, Take by mouth., Disp: , Rfl:    oxymetazoline  (AFRIN) 0.05 % nasal spray, Place 1 spray into both nostrils 2 (two) times daily as needed for congestion. Use for nose bleeds, Disp: , Rfl:    terbinafine  (LAMISIL ) 250 MG tablet, Take 1 tablet (250 mg total) by mouth daily., Disp: 30 tablet, Rfl: 2   pantoprazole  (PROTONIX ) 40 MG tablet, Take 1 tablet (40 mg total) by mouth daily., Disp: 90 tablet, Rfl: 3  Allergies  Allergen Reactions   Albumin (Human) Rash   Amoxicillin-Pot Clavulanate Hives   Gabapentin Shortness Of Breath   Latex Dermatitis and  Rash   Betadine [Povidone Iodine ] Dermatitis, Rash and Other (See Comments)    Skin burning Topical betadine and iodine  have this reaction with patient.  IV contrast is NOT a problem.  jkl   Sucralfate     Other reaction(s): Other (See Comments) Abdominal Pain   Amoxicillin     REACTION: rash   Chantix [Varenicline Tartrate]     Heart racing   Erythromycin     REACTION: rash   Sucralfate Nausea And Vomiting    Heart racing Lightheaded    Tegaderm Ag Mesh [Silver] Hives    Mild     Objective:   BP (!) 142/82 (BP Location: Left Arm, Patient Position: Sitting, Cuff Size: Large)   Pulse 83   Temp 98.1 F (36.7  C) (Temporal)   Ht 5\' 5"  (1.651 m)   Wt 181 lb 3.2 oz (82.2 kg)   SpO2 96%   BMI 30.15 kg/m    Physical Exam Constitutional:      General: She is not in acute distress.    Appearance: Normal appearance. She is normal weight. She is not ill-appearing, toxic-appearing or diaphoretic.  HENT:     Head: Normocephalic.  Cardiovascular:     Rate and Rhythm: Normal rate and regular rhythm.  Pulmonary:     Effort: Pulmonary effort is normal.  Musculoskeletal:        General: Normal range of motion.  Neurological:     General: No focal deficit present.     Mental Status: She is alert and oriented to person, place, and time. Mental status is at baseline.  Psychiatric:        Mood and Affect: Mood normal.        Behavior: Behavior normal.        Thought Content: Thought content normal.        Judgment: Judgment normal.    Wt Readings from Last 3 Encounters:  03/16/24 181 lb 3.2 oz (82.2 kg)  03/15/24 187 lb (84.8 kg)  02/14/24 187 lb (84.8 kg)      Assessment & Plan:  Gastroesophageal reflux disease with esophagitis without hemorrhage Assessment & Plan: Discussed long term PPI therapy  Continue rx pantoprazole  40 mg once daily.  Try to decrease and or avoid spicy foods, fried fatty foods, and also caffeine and chocolate as these can increase heartburn symptoms.  Reviewed EGD and colonoscopy   Orders: -     Pantoprazole  Sodium; Take 1 tablet (40 mg total) by mouth daily.  Dispense: 90 tablet; Refill: 3  Essential hypertension, benign Assessment & Plan: Continue amlodipine  5 mg and losartan  50 mg Had been on 100 mg losartan  but blood pressure was dropping too low.  Pt advised of the following:  Continue medication as prescribed. Monitor blood pressure periodically and/or when you feel symptomatic. Goal is <130/90 on average. Ensure that you have rested for 30 minutes prior to checking your blood pressure. Record your readings and bring them to your next visit if necessary.work  on a low sodium diet. Ordering urine m/a  Orders: -     Microalbumin / creatinine urine ratio  Pure hypercholesterolemia Assessment & Plan: Continue atorvastatin  20 mg nightly. Ordered lipid panel, pending results. Work on low cholesterol diet and exercise as tolerated   Orders: -     Lipid panel  Vitamin B12 deficiency -     Vitamin B12  Prediabetes Assessment & Plan: Pt advised of the following: Work on a diabetic diet, try to incorporate exercise at  least 20-30 a day for 3 days a week or more.  A1c ordered today pending results.   Orders: -     Hemoglobin A1c -     Comprehensive metabolic panel with GFR  Epistaxis     Follow up plan: Return in about 6 months (around 09/16/2024) for f/u CPE.  Felicita Horns, FNP

## 2024-03-16 NOTE — Assessment & Plan Note (Addendum)
 Continue amlodipine  5 mg and losartan  50 mg Had been on 100 mg losartan  but blood pressure was dropping too low.  Pt advised of the following:  Continue medication as prescribed. Monitor blood pressure periodically and/or when you feel symptomatic. Goal is <130/90 on average. Ensure that you have rested for 30 minutes prior to checking your blood pressure. Record your readings and bring them to your next visit if necessary.work on a low sodium diet. Ordering urine m/a

## 2024-03-17 ENCOUNTER — Ambulatory Visit: Payer: Self-pay | Admitting: Family

## 2024-03-17 ENCOUNTER — Other Ambulatory Visit (INDEPENDENT_AMBULATORY_CARE_PROVIDER_SITE_OTHER)

## 2024-03-17 DIAGNOSIS — E875 Hyperkalemia: Secondary | ICD-10-CM

## 2024-03-17 LAB — POTASSIUM: Potassium: 4.4 meq/L (ref 3.5–5.1)

## 2024-03-20 ENCOUNTER — Ambulatory Visit: Payer: Self-pay | Admitting: Family

## 2024-03-23 DIAGNOSIS — Z8522 Personal history of malignant neoplasm of nasal cavities, middle ear, and accessory sinuses: Secondary | ICD-10-CM | POA: Diagnosis not present

## 2024-03-23 DIAGNOSIS — J340 Abscess, furuncle and carbuncle of nose: Secondary | ICD-10-CM | POA: Diagnosis not present

## 2024-03-23 MED ORDER — LOSARTAN POTASSIUM 100 MG PO TABS: 100.0000 mg | ORAL_TABLET | Freq: Every day | ORAL | 0 refills | Status: DC

## 2024-03-27 DIAGNOSIS — J301 Allergic rhinitis due to pollen: Secondary | ICD-10-CM | POA: Diagnosis not present

## 2024-04-03 DIAGNOSIS — J301 Allergic rhinitis due to pollen: Secondary | ICD-10-CM | POA: Diagnosis not present

## 2024-04-03 NOTE — Telephone Encounter (Signed)
 Spoke with pt and she has been scheduled to see Dr. Cherlyn Cornet on 04/04/24 at 2pm. Pt does not remember when her last Tdap was.

## 2024-04-03 NOTE — Telephone Encounter (Signed)
 When was pt last tetanus? If she has not had in over 5 y eras she needs to come in and be seen either here or urgent care to get this tetanus on board since recent cat bite.   If she is up to date with tetanus let me know and I will send a treatment plan without an office visit.

## 2024-04-04 ENCOUNTER — Ambulatory Visit: Admitting: Family Medicine

## 2024-04-04 ENCOUNTER — Encounter: Payer: Self-pay | Admitting: Family Medicine

## 2024-04-04 VITALS — BP 136/73 | HR 85 | Temp 97.3°F | Ht 65.0 in | Wt 179.5 lb

## 2024-04-04 DIAGNOSIS — Z23 Encounter for immunization: Secondary | ICD-10-CM

## 2024-04-04 DIAGNOSIS — W5503XA Scratched by cat, initial encounter: Secondary | ICD-10-CM | POA: Diagnosis not present

## 2024-04-04 DIAGNOSIS — S50811A Abrasion of right forearm, initial encounter: Secondary | ICD-10-CM | POA: Diagnosis not present

## 2024-04-04 MED ORDER — CEPHALEXIN 500 MG PO CAPS
500.0000 mg | ORAL_CAPSULE | Freq: Three times a day (TID) | ORAL | 0 refills | Status: DC
Start: 2024-04-04 — End: 2024-06-21

## 2024-04-04 NOTE — Assessment & Plan Note (Signed)
 Acute, will treat with antibiotic prophylaxis.  PCN allergy.  Treat with keflex  500 mg TID x 5 days.  Td given.  Return and ER precautions given.

## 2024-04-04 NOTE — Progress Notes (Signed)
 Patient ID: Heather Beasley, female    DOB: July 25, 1956, 68 y.o.   MRN: [id number]  This visit was conducted in person.  BP (!) 140/86   Pulse 96   Temp (!) 97.3 F (36.3 C) (Temporal)   Ht 5\' 5"  (1.651 m)   Wt 179 lb 8 oz (81.4 kg)   SpO2 96%   BMI 29.87 kg/m    CC:  Chief Complaint  Patient presents with   Cat Scratch    Right Lower Arm-Thursday of last week-Needs Td    Subjective:   HPI: Heather Beasley is a 68 y.o. female presenting on 04/04/2024 for Cat Scratch (Right Lower Arm-Thursday of last week-Needs Td)   She reports 1 week ago.. cat scratch on  right forearm 5 days ago  While playing, cat scratched her arm.  Cleaning with peroxide and applying neosporin.   No flu like symptoms.  No fever, no redness spreading.    No past Td/Tdap documented.      Relevant past medical, surgical, family and social history reviewed and updated as indicated. Interim medical history since our last visit reviewed. Allergies and medications reviewed and updated. Outpatient Medications Prior to Visit  Medication Sig Dispense Refill   acetaminophen  (TYLENOL ) 650 MG CR tablet Take 650 mg by mouth every 8 (eight) hours as needed for pain.     albuterol  (VENTOLIN  HFA) 108 (90 Base) MCG/ACT inhaler Inhale 1-2 puffs into the lungs every 6 (six) hours as needed (for cough). 18 g 2   atorvastatin  (LIPITOR) 20 MG tablet TAKE ONE TABLET BY MOUTH ONCE A DAY 90 tablet 3   Budeson-Glycopyrrol-Formoterol  (BREZTRI  AEROSPHERE) 160-9-4.8 MCG/ACT AERO Inhale 2 puffs into the lungs in the morning and at bedtime. 11.8 g 0   cetirizine (ZYRTEC) 10 MG tablet Take 10 mg by mouth daily. Allergies.     Cholecalciferol (VITAMIN D ) 50 MCG (2000 UT) CAPS Take 2,000 Units by mouth.     clopidogrel  (PLAVIX ) 75 MG tablet Take 1 tablet (75 mg total) by mouth daily. 90 tablet 3   cyclobenzaprine  (FLEXERIL ) 10 MG tablet TAKE ONE TABLET (10 MG TOTAL) BY MOUTH THREE TIMES DAILY AS NEEDED FOR MUSCLE SPASMS. 90 tablet  0   EPINEPHrine  0.3 mg/0.3 mL IJ SOAJ injection Inject 1 mg into the skin as needed.     furosemide  (LASIX ) 20 MG tablet Take 1 tablet (20 mg total) by mouth as needed for fluid. 30 tablet 0   losartan  (COZAAR ) 100 MG tablet Take 1 tablet (100 mg total) by mouth daily. 90 tablet 0   montelukast  (SINGULAIR ) 10 MG tablet TAKE ONE TABLET BY MOUTH AT BEDTIME 90 tablet 3   Multiple Vitamins-Minerals (CENTRUM ADULTS PO) Take 1 tablet by mouth.     oxymetazoline  (AFRIN) 0.05 % nasal spray Place 1 spray into both nostrils 2 (two) times daily as needed for congestion. Use for nose bleeds     pantoprazole  (PROTONIX ) 40 MG tablet Take 1 tablet (40 mg total) by mouth daily. 90 tablet 3   terbinafine  (LAMISIL ) 250 MG tablet Take 1 tablet (250 mg total) by mouth daily. 30 tablet 2   cyanocobalamin  (VITAMIN B12) 1000 MCG tablet Take 1,000 mcg by mouth every other day.     nystatin  (MYCOSTATIN ) 100000 UNIT/ML suspension Take by mouth.     No facility-administered medications prior to visit.     Per HPI unless specifically indicated in ROS section below Review of Systems  Constitutional:  Negative for fatigue and  fever.  HENT:  Negative for congestion.   Eyes:  Negative for pain.  Respiratory:  Negative for cough and shortness of breath.   Cardiovascular:  Negative for chest pain, palpitations and leg swelling.  Gastrointestinal:  Negative for abdominal pain.  Genitourinary:  Negative for dysuria and vaginal bleeding.  Musculoskeletal:  Negative for back pain.  Neurological:  Negative for syncope, light-headedness and headaches.  Psychiatric/Behavioral:  Negative for dysphoric mood.    Objective:  BP (!) 140/86   Pulse 96   Temp (!) 97.3 F (36.3 C) (Temporal)   Ht 5\' 5"  (1.651 m)   Wt 179 lb 8 oz (81.4 kg)   SpO2 96%   BMI 29.87 kg/m   Wt Readings from Last 3 Encounters:  04/04/24 179 lb 8 oz (81.4 kg)  03/16/24 181 lb 3.2 oz (82.2 kg)  03/15/24 187 lb (84.8 kg)      Physical  Exam Constitutional:      General: She is not in acute distress.    Appearance: Normal appearance. She is well-developed. She is not ill-appearing or toxic-appearing.  HENT:     Head: Normocephalic.     Right Ear: Hearing, tympanic membrane, ear canal and external ear normal. Tympanic membrane is not erythematous, retracted or bulging.     Left Ear: Hearing, tympanic membrane, ear canal and external ear normal. Tympanic membrane is not erythematous, retracted or bulging.     Nose: No mucosal edema or rhinorrhea.     Right Sinus: No maxillary sinus tenderness or frontal sinus tenderness.     Left Sinus: No maxillary sinus tenderness or frontal sinus tenderness.     Mouth/Throat:     Pharynx: Uvula midline.  Eyes:     General: Lids are normal. Lids are everted, no foreign bodies appreciated.     Conjunctiva/sclera: Conjunctivae normal.     Pupils: Pupils are equal, round, and reactive to light.  Neck:     Thyroid : No thyroid  mass or thyromegaly.     Vascular: No carotid bruit.     Trachea: Trachea normal.  Cardiovascular:     Rate and Rhythm: Normal rate and regular rhythm.     Pulses: Normal pulses.     Heart sounds: Normal heart sounds, S1 normal and S2 normal. No murmur heard.    No friction rub. No gallop.  Pulmonary:     Effort: Pulmonary effort is normal. No tachypnea or respiratory distress.     Breath sounds: Normal breath sounds. No decreased breath sounds, wheezing, rhonchi or rales.  Abdominal:     General: Bowel sounds are normal.     Palpations: Abdomen is soft.     Tenderness: There is no abdominal tenderness.  Musculoskeletal:     Cervical back: Normal range of motion and neck supple.  Skin:    General: Skin is warm and dry.     Findings: Wound present. No rash.  Neurological:     Mental Status: She is alert.  Psychiatric:        Mood and Affect: Mood is not anxious or depressed.        Speech: Speech normal.        Behavior: Behavior normal. Behavior is  cooperative.        Thought Content: Thought content normal.        Judgment: Judgment normal.       Results for orders placed or performed in visit on 03/17/24  Potassium   Collection Time: 03/17/24  9:37 AM  Result Value Ref Range   Potassium 4.4 3.5 - 5.1 mEq/L    Assessment and Plan  Cat scratch Assessment & Plan:  Acute, will treat with antibiotic prophylaxis.  PCN allergy.  Treat with keflex  500 mg TID x 5 days.  Td given.  Return and ER precautions given.   Other orders -     Cephalexin ; Take 1 capsule (500 mg total) by mouth 3 (three) times daily.  Dispense: 15 capsule; Refill: 0    No follow-ups on file.   Herby Lolling, MD

## 2024-04-04 NOTE — Addendum Note (Signed)
 Addended by: Wyn Heater on: 04/04/2024 02:33 PM   Modules accepted: Orders

## 2024-04-06 ENCOUNTER — Encounter: Payer: Self-pay | Admitting: Pulmonary Disease

## 2024-04-06 ENCOUNTER — Ambulatory Visit: Payer: Medicare PPO | Admitting: Dermatology

## 2024-04-06 ENCOUNTER — Other Ambulatory Visit: Payer: Self-pay | Admitting: Emergency Medicine

## 2024-04-06 ENCOUNTER — Ambulatory Visit: Admitting: Pulmonary Disease

## 2024-04-06 VITALS — BP 150/90 | HR 88 | Temp 98.2°F | Ht 65.0 in | Wt 180.0 lb

## 2024-04-06 DIAGNOSIS — J449 Chronic obstructive pulmonary disease, unspecified: Secondary | ICD-10-CM

## 2024-04-06 DIAGNOSIS — R918 Other nonspecific abnormal finding of lung field: Secondary | ICD-10-CM

## 2024-04-06 DIAGNOSIS — F17290 Nicotine dependence, other tobacco product, uncomplicated: Secondary | ICD-10-CM

## 2024-04-06 DIAGNOSIS — J4489 Other specified chronic obstructive pulmonary disease: Secondary | ICD-10-CM

## 2024-04-06 DIAGNOSIS — Z87891 Personal history of nicotine dependence: Secondary | ICD-10-CM

## 2024-04-06 DIAGNOSIS — Z7289 Other problems related to lifestyle: Secondary | ICD-10-CM | POA: Diagnosis not present

## 2024-04-06 DIAGNOSIS — I10 Essential (primary) hypertension: Secondary | ICD-10-CM | POA: Diagnosis not present

## 2024-04-06 DIAGNOSIS — I517 Cardiomegaly: Secondary | ICD-10-CM

## 2024-04-06 DIAGNOSIS — R911 Solitary pulmonary nodule: Secondary | ICD-10-CM

## 2024-04-06 NOTE — Patient Instructions (Signed)
 VISIT SUMMARY:  Today, we discussed your ongoing issues with COPD, ground glass nodules in your lungs, hypertension, and allergic rhinitis. We reviewed your current treatments and made some recommendations to help manage your symptoms more effectively.  YOUR PLAN:  -CHRONIC OBSTRUCTIVE PULMONARY DISEASE (COPD): COPD is a chronic lung condition that makes it hard to breathe. Your COPD is well-controlled with Breztri , and you rarely need albuterol . Humidity can make your symptoms worse, so try to avoid it when possible. We also discussed the potential benefits of quitting vaping to improve your lung health. Continue using Breztri  as prescribed, and consider switching to a FUM device to replace vaping.  -GROUND GLASS NODULE IN LUNG: Ground glass nodules are small areas in the lungs that appear hazy on a CT scan. They are usually inflammatory and not immediately concerning for cancer. We will monitor these nodules with a follow-up CT scan in six months to ensure they remain stable. It's important to keep up with these follow-up scans to check for any changes.  -HYPERTENSION: Hypertension, or high blood pressure, is a condition where the force of the blood against your artery walls is too high. Your blood pressure is occasionally elevated, especially when you are active. Continue with your current medication and monitor your blood pressure regularly, especially after resting.  -ALLERGIC RHINITIS: Allergic rhinitis is an allergic reaction that causes sneezing, congestion, and a runny nose. Your symptoms are generally controlled with weekly allergy shots, although you still experience occasional sneezing spells. Continue with your weekly allergy shots and monitor for any changes in your symptoms.  INSTRUCTIONS:  Please schedule a follow-up CT scan in six months to monitor the ground glass nodules in your lungs. Continue to monitor your blood pressure regularly, especially after resting, and keep taking your  current medications as prescribed.

## 2024-04-06 NOTE — Progress Notes (Signed)
 Subjective:    Patient ID: Heather Beasley, female    DOB: 1956-07-30, 68 y.o.   MRN: [id number]  Patient Care Team: Felicita Horns, FNP as PCP - General (Family Medicine) Odie Benne, MD as PCP - Cardiology (Cardiology) Glenis Langdon, MD as Referring Physician (Radiation Oncology) Lesly Raspberry, MD (Otolaryngology) Jodeen Munch, MD as Consulting Physician (Neurosurgery) Valene Gash, NP as Nurse Practitioner (Vascular Surgery) [facility], [facility] Jacinta Martinis, MD as Consulting Physician (Ophthalmology) Marc Senior, MD as Consulting Physician (Pulmonary Disease)  Chief Complaint  Patient presents with   Follow-up    BACKGROUND/INTERVAL:Heather Beasley is a 68 year old former cigarette smoker (quit 2/24, 39 PY) with a history as noted below, who presents for follow-up on the issue of dyspnea and COPD.  Patient was last seen on 05 January 2024 at that time she was instructed to continue Breztri .  No exacerbations since her last visit.  HPI Discussed the use of AI scribe software for clinical note transcription with the patient, who gave verbal consent to proceed.  History of Present Illness   Heather Beasley is a 68 year old female with COPD who presents with multifactorial dyspnea and cough.  She experiences dyspnea and cough, exacerbated by humidity, making breathing uncomfortable. Her blood pressure remains elevated despite medication adjustments.  She uses Breztri  for COPD, which she finds effective, as she rarely needs albuterol . She has an occasional morning cough attributed to postnasal drip. Weekly allergy shots are perceived as beneficial, though she experiences sneezing spells.  She vapes approximately ten times a day using a non-nicotine  product called 'frozen strawberries'. She finds quitting challenging due to stress from her grandchildren and dogs.  Lung cancer screening CT chest from 13 January 2024 was reviewed with the patient.  This  shows no significant growth of her previously visualized pulmonary nodules the largest is a groundglass nodule at 9.1 mm without solid component.  This has had less than a millimeter in growth from prior scans.  She also has asymmetric septal hypertrophy and hypertension, follows with cardiology.  DATA 01/14/2022 CT chest lung cancer screening: Lung RADS 2, 8 mm groundglass nodule left lower lobe, emphysema, coronary calcifications noted 01/21/2022 echocardiogram: LVEF 70 to 75%, hyperdynamic LV, no wall motion abnormalities, moderate ASH and grade 1 DD.  Mild mitral valve regurgitation 01/26/2022 coronary CTA: Minimal coronary artery disease, PFO with left-to-right contrast flow 03/05/2022 PFTs: FEV1 1.60 L or 62% predicted, FVC 2.52 L or 75% predicted, FEV1/FVC 64%, no bronchodilator response, lung volumes normal.  Diffusion capacity moderately reduced, consistent with moderately severe obstructive airways disease, moderate diffusion impairment, probable concomitant restriction likely on the basis of obesity 05/01/2022 sleep study (in lab): No indication of sleep disordered breathing.  Consider other etiologies of somnolence. 01/15/2023 CT chest lung cancer screening: Lung RADS 2, 8 mm groundglass nodule on the left lower lobe unchanged, emphysema, coronary calcifications.  Review of Systems A 10 point review of systems was performed and it is as noted above otherwise negative.   Patient Active Problem List   Diagnosis Date Noted   Cat scratch 04/04/2024   Prediabetes 03/16/2024   Epistaxis 03/16/2024   COPD with acute exacerbation (HCC) 12/31/2023   Radicular pain in right arm 12/14/2023   S/P cervical spinal fusion (c3-6) 12/14/2023   Obesity (BMI 30-39.9) 08/25/2023   Nodule of left palm 08/25/2023   CVD (cardiovascular disease) 08/25/2023   Coronary artery disease involving native coronary artery of native heart without angina pectoris 08/25/2023  Nonrheumatic mitral valve  regurgitation 08/25/2023   Status post lumbar spinal fusion 03/08/2023   Lumbar adjacent segment disease with spondylolisthesis 03/01/2023   S/P lumbar fusion 03/01/2023   Lumbar spinal stenosis due to adjacent segment disease after fusion procedure (L4-S1 fusion, Left L3/4) 11/11/2022   Lumbar post-laminectomy syndrome 11/11/2022   Chronic pain syndrome 11/11/2022   Sacroiliac joint pain 11/11/2022   Lumbar radiculopathy 07/17/2022   History of left foot drop 07/17/2022   Vitamin B12 deficiency 04/14/2022   Snoring 01/20/2022   DOE (dyspnea on exertion) 01/08/2022   Elevated brain natriuretic peptide (BNP) level 01/08/2022   Pulmonary emphysema (HCC) 01/08/2022   Pulmonary nodule less than 6 mm determined by computed tomography of lung 01/08/2022   Family history of breast cancer in female 11/17/2021   Carotid stenosis, left 06/27/2020   Primary osteoarthritis involving multiple joints 03/01/2018   Osteopenia 02/14/2018   HLD (hyperlipidemia) 12/01/2016   Irritable bowel syndrome with diarrhea 12/01/2016   Cancer of nasal cavity and sinus (HCC) 07/07/2015   Essential hypertension, benign 08/08/2013   Mixed urge and stress incontinence 03/27/2013   Allergic rhinitis 12/13/2012   Chronic headaches 02/22/2012   GERD (gastroesophageal reflux disease) 02/22/2012   Vitamin D  deficiency 01/04/2012    Social History   Tobacco Use   Smoking status: Every Day    Types: E-cigarettes   Smokeless tobacco: Never   Tobacco comments:    Using vape with no nicotine    Substance Use Topics   Alcohol use: Not Currently    Allergies  Allergen Reactions   Albumin (Human) Rash   Amoxicillin-Pot Clavulanate Hives   Gabapentin Shortness Of Breath   Latex Dermatitis and Rash   Betadine [Povidone Iodine ] Dermatitis, Rash and Other (See Comments)    Skin burning Topical betadine and iodine  have this reaction with patient.  IV contrast is NOT a problem.  jkl   Sucralfate     Other  reaction(s): Other (See Comments) Abdominal Pain   Amoxicillin     REACTION: rash   Chantix [Varenicline Tartrate]     Heart racing   Erythromycin     REACTION: rash   Sucralfate Nausea And Vomiting    Heart racing Lightheaded    Tegaderm Ag Mesh [Silver] Hives    Mild     Current Meds  Medication Sig   acetaminophen  (TYLENOL ) 650 MG CR tablet Take 650 mg by mouth every 8 (eight) hours as needed for pain.   albuterol  (VENTOLIN  HFA) 108 (90 Base) MCG/ACT inhaler Inhale 1-2 puffs into the lungs every 6 (six) hours as needed (for cough).   atorvastatin  (LIPITOR) 20 MG tablet TAKE ONE TABLET BY MOUTH ONCE A DAY   Budeson-Glycopyrrol-Formoterol  (BREZTRI  AEROSPHERE) 160-9-4.8 MCG/ACT AERO Inhale 2 puffs into the lungs in the morning and at bedtime.   cephALEXin  (KEFLEX ) 500 MG capsule Take 1 capsule (500 mg total) by mouth 3 (three) times daily.   cetirizine (ZYRTEC) 10 MG tablet Take 10 mg by mouth daily. Allergies.   Cholecalciferol (VITAMIN D ) 50 MCG (2000 UT) CAPS Take 2,000 Units by mouth.   clopidogrel  (PLAVIX ) 75 MG tablet Take 1 tablet (75 mg total) by mouth daily.   cyclobenzaprine  (FLEXERIL ) 10 MG tablet TAKE ONE TABLET (10 MG TOTAL) BY MOUTH THREE TIMES DAILY AS NEEDED FOR MUSCLE SPASMS.   EPINEPHrine  0.3 mg/0.3 mL IJ SOAJ injection Inject 1 mg into the skin as needed.   furosemide  (LASIX ) 20 MG tablet Take 1 tablet (20 mg total) by mouth  as needed for fluid.   losartan  (COZAAR ) 100 MG tablet Take 1 tablet (100 mg total) by mouth daily.   montelukast  (SINGULAIR ) 10 MG tablet TAKE ONE TABLET BY MOUTH AT BEDTIME   Multiple Vitamins-Minerals (CENTRUM ADULTS PO) Take 1 tablet by mouth.   oxymetazoline  (AFRIN) 0.05 % nasal spray Place 1 spray into both nostrils 2 (two) times daily as needed for congestion. Use for nose bleeds   pantoprazole  (PROTONIX ) 40 MG tablet Take 1 tablet (40 mg total) by mouth daily.   terbinafine  (LAMISIL ) 250 MG tablet Take 1 tablet (250 mg total) by mouth  daily.    Immunization History  Administered Date(s) Administered   Influenza Split 07/27/2011, 08/25/2012   Influenza Whole 09/14/2007   Influenza,inj,Quad PF,6+ Mos 08/10/2013, 08/01/2015, 07/16/2016, 08/02/2018, 07/15/2019   Influenza,inj,quad, With Preservative 08/13/2017   Influenza-Unspecified 07/26/2021, 08/05/2022, 07/29/2023   Pneumococcal Conjugate-13 07/16/2016   Pneumococcal Polysaccharide-23 11/27/2011, 03/01/2018   Respiratory Syncytial Virus Vaccine,Recomb Aduvanted(Arexvy) 08/26/2022   Td 04/04/2024        Objective:     BP (!) 150/90 (BP Location: Left Arm, Patient Position: Sitting, Cuff Size: Normal)   Pulse 88   Temp 98.2 F (36.8 C) (Oral)   Ht 5' 5 (1.651 m)   Wt 180 lb (81.6 kg)   SpO2 96%   BMI 29.95 kg/m   SpO2: 96 %  GENERAL: Overweight woman, no acute distress, fully ambulatory.  No conversational dyspnea.  Nasal quality to the speech. HEAD: Normocephalic, atraumatic.  EYES: Pupils equal, round, reactive to light.  No scleral icterus.  MOUTH: Wears dentures upper and lower, class III airway.  No thrush. NECK: Supple. No thyromegaly. Trachea midline. No JVD.  No adenopathy. PULMONARY: Good air entry bilaterally.  Very faint end expiratory wheezes. CARDIOVASCULAR: S1 and S2. Regular rate and rhythm.  Grade 2/6 systolic ejection murmur which increases with Valsalva.  No gallop. ABDOMEN: Benign. MUSCULOSKELETAL: No joint deformity, no clubbing, no edema.  NEUROLOGIC: Chronic left foot drop.  This affects gait.  No other localizing finding. SKIN: Intact,warm,dry.  Nails are short (patient is a nail biter). PSYCH: Mood and behavior normal.   Representative images from CT performed 13 January 2024 showing the 9 mm groundglass nodule (arrow):       Assessment & Plan:     ICD-10-CM   1. Stage 2 moderate COPD by GOLD classification (HCC)  J44.9     2. Asymmetric septal hypertrophy  I51.7     3. Vapes non-nicotine  containing substance  Z72.89      4. Personal history of tobacco use, presenting hazards to health  Z87.891      Discussion:    Chronic Obstructive Pulmonary Disease (COPD) COPD is well-controlled with Breztri . No recent use of albuterol , indicating stable symptoms. Humidity exacerbates symptoms, but overall breathing is stable. Cough is occasionally present, likely due to postnasal drip. Discussed potential benefits of quitting vaping to improve lung health, as vaping may contribute to lung inflammation. - Continue Breztri  as prescribed - Educate on avoiding humidity to prevent exacerbation - Consider switching to FUM device to replace vaping  Ground glass nodule in lung Ground glass nodules present in the lungs, likely inflammatory. No solid components observed, reducing immediate concern for malignancy. Follow-up imaging is planned to monitor for changes. Discussed that nodules without solid components are typically observed rather than biopsied. - Schedule follow-up CT scan in six months to monitor nodules - Educate on the importance of follow-up imaging to assess nodule stability  Hypertension  Blood pressure is occasionally elevated, especially when active. She is on medication with instructions to monitor blood pressure after resting. Hypertension management is crucial due to cardiac hypertrophy. - Continue current antihypertensive regimen - Monitor blood pressure regularly, especially after resting  Allergic rhinitis Allergic rhinitis is managed with weekly allergy shots. Occasional sneezing spells occur, but symptoms are generally controlled. High pollen levels may contribute to symptoms. - Continue weekly allergy shots - Monitor for changes in symptoms and adjust treatment as necessary    Advised if symptoms do not improve or worsen, to please contact office for sooner follow up or seek emergency care.    I spent 31 minutes of dedicated to the care of this patient on the date of this encounter to include  pre-visit review of records, face-to-face time with the patient discussing conditions above, post visit ordering of testing, clinical documentation with the electronic health record, making appropriate referrals as documented, and communicating necessary findings to members of the patients care team.     C. Chloe Counter, MD Advanced Bronchoscopy PCCM [facility]    *This note was generated using voice recognition software/Dragon and/or AI transcription program.  Despite best efforts to proofread, errors can occur which can change the meaning. Any transcriptional errors that result from this process are unintentional and may not be fully corrected at the time of dictation.

## 2024-04-10 ENCOUNTER — Ambulatory Visit (HOSPITAL_BASED_OUTPATIENT_CLINIC_OR_DEPARTMENT_OTHER): Admitting: Student in an Organized Health Care Education/Training Program

## 2024-04-10 ENCOUNTER — Ambulatory Visit
Admission: RE | Admit: 2024-04-10 | Discharge: 2024-04-10 | Disposition: A | Discharge status: Home / Self Care | Source: Ambulatory Visit | Attending: Student in an Organized Health Care Education/Training Program | Admitting: Student in an Organized Health Care Education/Training Program

## 2024-04-10 DIAGNOSIS — Z981 Arthrodesis status: Secondary | ICD-10-CM | POA: Insufficient documentation

## 2024-04-10 DIAGNOSIS — G894 Chronic pain syndrome: Secondary | ICD-10-CM | POA: Diagnosis not present

## 2024-04-10 DIAGNOSIS — M5412 Radiculopathy, cervical region: Secondary | ICD-10-CM

## 2024-04-10 MED ORDER — IOHEXOL 180 MG/ML  SOLN
INTRAMUSCULAR | Status: AC
  Filled 2024-04-10: qty 20

## 2024-04-10 MED ORDER — ROPIVACAINE HCL 2 MG/ML IJ SOLN
INTRAMUSCULAR | Status: AC
  Filled 2024-04-10: qty 20

## 2024-04-10 MED ORDER — DEXAMETHASONE SODIUM PHOSPHATE 10 MG/ML IJ SOLN
10.0000 mg | Freq: Once | INTRAMUSCULAR | Status: AC
  Administered 2024-04-10: 10 mg

## 2024-04-10 MED ORDER — DEXAMETHASONE SODIUM PHOSPHATE 10 MG/ML IJ SOLN
INTRAMUSCULAR | Status: AC
Start: 2024-04-10 — End: 2024-04-10
  Filled 2024-04-10: qty 1

## 2024-04-10 MED ORDER — LIDOCAINE HCL 2 % IJ SOLN
INTRAMUSCULAR | Status: AC
  Filled 2024-04-10: qty 20

## 2024-04-10 MED ORDER — ROPIVACAINE HCL 2 MG/ML IJ SOLN
1.0000 mL | Freq: Once | INTRAMUSCULAR | Status: AC
  Administered 2024-04-10: 1 mL via EPIDURAL

## 2024-04-10 MED ORDER — SODIUM CHLORIDE 0.9% FLUSH: 1.0000 mL | Freq: Once | INTRAVENOUS | Status: AC

## 2024-04-10 MED ORDER — SODIUM CHLORIDE (PF) 0.9 % IJ SOLN
INTRAMUSCULAR | Status: AC
Start: 2024-04-10 — End: 2024-04-10
  Filled 2024-04-10: qty 10

## 2024-04-10 MED ORDER — LIDOCAINE HCL 2 % IJ SOLN
20.0000 mL | Freq: Once | INTRAMUSCULAR | Status: AC
  Administered 2024-04-10: 400 mg

## 2024-04-10 MED ORDER — IOHEXOL 180 MG/ML  SOLN
10.0000 mL | Freq: Once | INTRAMUSCULAR | Status: AC
  Administered 2024-04-10: 10 mL via EPIDURAL

## 2024-04-10 NOTE — Progress Notes (Signed)
 Safety precautions to be maintained throughout the outpatient stay will include: orient to surroundings, keep bed in low position, maintain call bell within reach at all times, provide assistance with transfer out of bed and ambulation.

## 2024-04-10 NOTE — Patient Instructions (Signed)

## 2024-04-10 NOTE — Progress Notes (Signed)
 PROVIDER NOTE: Interpretation of information contained herein should be left to medically-trained personnel. Specific patient instructions are provided elsewhere under Patient Instructions section of medical record. This document was created in part using STT-dictation technology, any transcriptional errors that may result from this process are unintentional.  Patient: Heather Beasley Type: Established DOB: 06/25/1956 MRN: [id number] PCP: Felicita Horns, FNP  Service: Procedure DOS: 04/10/2024 Setting: Ambulatory Location: Ambulatory outpatient facility Delivery: Face-to-face Provider: Cephus Collin, MD Specialty: Interventional Pain Management Specialty designation: 09 Location: Outpatient facility Ref. Prov.: Cephus Collin, MD       Interventional Therapy   Procedure: Cervical Epidural Steroid injection (CESI) (Interlaminar) #2  Laterality: Right  Level: T1-2 Imaging: Fluoroscopy-assisted DOS: 04/10/2024  Performed by: Cephus Collin, MD Anesthesia: Local anesthesia (1-2% Lidocaine )   Purpose: Diagnostic/Therapeutic Indications: Cervicalgia, cervical radicular pain, degenerative disc disease, severe enough to impact quality of life or function. 1. Chronic pain syndrome   2. S/P cervical spinal fusion   3. Cervical radicular pain    NAS-11 score:   Pre-procedure: 5 /10   Post-procedure: 0-No pain/10      Position  Prep  Materials:  Location setting: Procedure suite Position: Prone, on modified reverse trendelenburg to facilitate breathing, with head in head-cradle. Pillows positioned under chest (below chin-level) with cervical spine flexed. Safety Precautions: Patient was assessed for positional comfort and pressure points before starting the procedure. Prepping solution: DuraPrep (Iodine  Povacrylex [0.7% available iodine ] and Isopropyl Alcohol, 74% w/w) Prep Area: Entire  cervicothoracic region Approach: percutaneous, paramedial Intended target: Posterior cervical  epidural space Materials Procedure:  Tray: Epidural Needle(s): Epidural (Tuohy) Qty: 1 Length: (90mm) 3.5-inch Gauge: 22G   H&P (Pre-op Assessment):  Heather Beasley is a 68 y.o. (year old), female patient, seen today for interventional treatment. She  has a past surgical history that includes Appendectomy (1998); Abdominal hysterectomy (1998); Lumbar spine surgery (07/2004); Vaginal delivery; Cervical fusion (2010); Cholecystectomy (08/2011); Esophagogastroduodenoscopy (2012); Colonoscopy (N/A); Tonsillectomy (1968); Cystoscopy (N/A); Back surgery; Dilation and curettage of uterus; Fracture surgery (2001); Image guided sinus surgery (N/A, 06/25/2015); Polypectomy (N/A, 06/25/2015); Oophorectomy; CAROTID PTA/STENT INTERVENTION (Left, 06/27/2020); Cataract extraction; Anterior lumbar fusion (N/A, 03/01/2023); and Application of intraoperative CT scan (N/A, 03/01/2023). Heather Beasley has a current medication list which includes the following prescription(s): acetaminophen , albuterol , atorvastatin , breztri  aerosphere, cephalexin , cetirizine, vitamin d , clopidogrel , cyclobenzaprine , epinephrine , furosemide , losartan , montelukast , multiple vitamins-minerals, oxymetazoline , pantoprazole , and terbinafine , and the following Facility-Administered Medications: lidocaine . Her primarily concern today is the Back Pain  Initial Vital Signs:  Pulse/HCG Rate: 90ECG Heart Rate: 93 Temp: (!) 97.2 F (36.2 C) Resp: 16 BP: (!) 140/60 SpO2: 98 %  BMI: Estimated body mass index is 29.62 kg/m as calculated from the following:   Height as of this encounter: 5' 5 (1.651 m).   Weight as of this encounter: 178 lb (80.7 kg).  Risk Assessment: Allergies: Reviewed. She is allergic to albumin (human), amoxicillin-pot clavulanate, gabapentin, latex, betadine [povidone iodine ], sucralfate, amoxicillin, chantix [varenicline tartrate], erythromycin, sucralfate, and tegaderm ag mesh [silver].  Allergy Precautions: None  required Coagulopathies: Reviewed. None identified.  Blood-thinner therapy: None at this time Active Infection(s): Reviewed. None identified. Heather Beasley is afebrile  Site Confirmation: Heather Beasley was asked to confirm the procedure and laterality before marking the site Procedure checklist: Completed Consent: Before the procedure and under the influence of no sedative(s), amnesic(s), or anxiolytics, the patient was informed of the treatment options, risks and possible complications. To fulfill our ethical and legal obligations, as recommended by the American Medical Association's  Code of Ethics, I have informed the patient of my clinical impression; the nature and purpose of the treatment or procedure; the risks, benefits, and possible complications of the intervention; the alternatives, including doing nothing; the risk(s) and benefit(s) of the alternative treatment(s) or procedure(s); and the risk(s) and benefit(s) of doing nothing. The patient was provided information about the general risks and possible complications associated with the procedure. These may include, but are not limited to: failure to achieve desired goals, infection, bleeding, organ or nerve damage, allergic reactions, paralysis, and death. In addition, the patient was informed of those risks and complications associated to Spine-related procedures, such as failure to decrease pain; infection (i.e.: Meningitis, epidural or intraspinal abscess); bleeding (i.e.: epidural hematoma, subarachnoid hemorrhage, or any other type of intraspinal or peri-dural bleeding); organ or nerve damage (i.e.: Any type of peripheral nerve, nerve root, or spinal cord injury) with subsequent damage to sensory, motor, and/or autonomic systems, resulting in permanent pain, numbness, and/or weakness of one or several areas of the body; allergic reactions; (i.e.: anaphylactic reaction); and/or death. Furthermore, the patient was informed of those risks and  complications associated with the medications. These include, but are not limited to: allergic reactions (i.e.: anaphylactic or anaphylactoid reaction(s)); adrenal axis suppression; blood sugar elevation that in diabetics may result in ketoacidosis or comma; water  retention that in patients with history of congestive heart failure may result in shortness of breath, pulmonary edema, and decompensation with resultant heart failure; weight gain; swelling or edema; medication-induced neural toxicity; particulate matter embolism and blood vessel occlusion with resultant organ, and/or nervous system infarction; and/or aseptic necrosis of one or more joints. Finally, the patient was informed that Medicine is not an exact science; therefore, there is also the possibility of unforeseen or unpredictable risks and/or possible complications that may result in a catastrophic outcome. The patient indicated having understood very clearly. We have given the patient no guarantees and we have made no promises. Enough time was given to the patient to ask questions, all of which were answered to the patient's satisfaction. Ms. Marte has indicated that she wanted to continue with the procedure. Attestation: I, the ordering provider, attest that I have discussed with the patient the benefits, risks, side-effects, alternatives, likelihood of achieving goals, and potential problems during recovery for the procedure that I have provided informed consent. Date  Time: 04/10/2024 10:32 AM   Pre-Procedure Preparation:  Monitoring: As per clinic protocol. Respiration, ETCO2, SpO2, BP, heart rate and rhythm monitor placed and checked for adequate function Safety Precautions: Patient was assessed for positional comfort and pressure points before starting the procedure. Time-out: I initiated and conducted the Time-out before starting the procedure, as per protocol. The patient was asked to participate by confirming the accuracy of the  Time Out information. Verification of the correct person, site, and procedure were performed and confirmed by me, the nursing staff, and the patient. Time-out conducted as per Joint Commission's Universal Protocol (UP.01.01.01). Time: 1114 Start Time: 1114 hrs.  Description  Narrative of Procedure:          Rationale (medical necessity): procedure needed and proper for the diagnosis and/or treatment of the patient's medical symptoms and needs. Start Time: 1114 hrs. Safety Precautions: Aspiration looking for blood return was conducted prior to all injections. At no point did we inject any substances, as a needle was being advanced. No attempts were made at seeking any paresthesias. Safe injection practices and needle disposal techniques used. Medications properly checked for expiration dates.  SDV (single dose vial) medications used. Description of procedure: Protocol guidelines were followed. The patient was assisted into a comfortable position. The target area was identified and the area prepped in the usual manner. Skin & deeper tissues infiltrated with local anesthetic. Appropriate amount of time allowed to pass for local anesthetics to take effect. Using fluoroscopic guidance, the epidural needle was introduced through the skin, ipsilateral to the reported pain, and advanced to the target area. Posterior laminar os was contacted and the needle walked caudad, until the lamina was cleared. The ligamentum flavum was engaged and the epidural space identified using "loss-of-resistance technique" with 2-3 ml of PF-NaCl (0.9% NSS), in a 5cc dedicated LOR syringe. (See Imaging guidance below for use of contrast details.) Once proper needle placement was secured, and negative aspiration confirmed, the solution was injected in intermittent fashion, asking for systemic symptoms every 0.5cc. The needles were then removed and the area cleansed, making sure to leave some of the prepping solution back to take  advantage of its long term bactericidal properties.  Vitals:   04/10/24 1042 04/10/24 1110 04/10/24 1115 04/10/24 1120  BP: (!) 140/60 (!) 157/66 (!) 140/72 (!) 140/61  Pulse: 90     Resp: 16 15 20 18   Temp: (!) 97.2 F (36.2 C)     SpO2: 98% 97% 97% 99%  Weight: 178 lb (80.7 kg)     Height: 5' 5 (1.651 m)        End Time: 1119 hrs.  Imaging Guidance (Spinal):          Type of Imaging Technique: Fluoroscopy Guidance (Spinal) Indication(s): Fluoroscopy guidance for needle placement to enhance accuracy in procedures requiring precise needle localization for targeted delivery of medication in or near specific anatomical locations not easily accessible without such real-time imaging assistance. Exposure Time: Please see nurses notes. Contrast: Before injecting any contrast, we confirmed that the patient did not have an allergy to iodine , shellfish, or radiological contrast. Once satisfactory needle placement was completed at the desired level, radiological contrast was injected. Contrast injected under live fluoroscopy. No contrast complications. See chart for type and volume of contrast used. Fluoroscopic Guidance: I was personally present during the use of fluoroscopy. Tunnel Vision Technique used to obtain the best possible view of the target area. Parallax error corrected before commencing the procedure. Direction-depth-direction technique used to introduce the needle under continuous pulsed fluoroscopy. Once target was reached, antero-posterior, oblique, and lateral fluoroscopic projection used confirm needle placement in all planes. Images permanently stored in EMR. Interpretation: I personally interpreted the imaging intraoperatively. Adequate needle placement confirmed in multiple planes. Appropriate spread of contrast into desired area was observed. No evidence of afferent or efferent intravascular uptake. No intrathecal or subarachnoid spread observed. Permanent images saved into the  patient's record.  Post-operative Assessment:  Post-procedure Vital Signs:  Pulse/HCG Rate: 9086 Temp: (!) 97.2 F (36.2 C) Resp: 18 BP: (!) 140/61 SpO2: 99 %  EBL: None  Complications: No immediate post-treatment complications observed by team, or reported by patient.  Note: The patient tolerated the entire procedure well. A repeat set of vitals were taken after the procedure and the patient was kept under observation following institutional policy, for this type of procedure. Post-procedural neurological assessment was performed, showing return to baseline, prior to discharge. The patient was provided with post-procedure discharge instructions, including a section on how to identify potential problems. Should any problems arise concerning this procedure, the patient was given instructions to immediately contact us , at any time,  without hesitation. In any case, we plan to contact the patient by telephone for a follow-up status report regarding this interventional procedure.  Comments:  No additional relevant information.  Plan of Care (POC)  Orders:    Orders Placed This Encounter  Procedures   DG PAIN CLINIC C-ARM 1-60 MIN NO REPORT    Intraoperative interpretation by procedural physician at [facility].    Standing Status:   Standing    Number of Occurrences:   1    Reason for exam::   Assistance in needle guidance and placement for procedures requiring needle placement in or near specific anatomical locations not easily accessible without such assistance.     Medications ordered for procedure: Meds ordered this encounter  Medications   iohexol  (OMNIPAQUE ) 180 MG/ML injection 10 mL    Must be Myelogram-compatible. If not available, you may substitute with a water -soluble, non-ionic, hypoallergenic, myelogram-compatible radiological contrast medium.   dexamethasone  (DECADRON ) injection 10 mg   ropivacaine  (PF) 2 mg/mL (0.2%) (NAROPIN ) injection 1 mL   sodium  chloride flush (NS) 0.9 % injection 1 mL   lidocaine  (XYLOCAINE ) 2 % (with pres) injection 400 mg   Medications administered: We administered iohexol , dexamethasone , ropivacaine  (PF) 2 mg/mL (0.2%), and sodium chloride  flush.  See the medical record for exact dosing, route, and time of administration.  Follow-up plan:   Return in about 4 weeks (around 05/08/2024) for VV for PPE.       Right T1-2 ESI #2 04/10/24    Recent Visits Date Type Provider Dept  03/13/24 Office Visit Cephus Collin, MD [facility]  02/14/24 Procedure visit Cephus Collin, MD [facility]  01/24/24 Procedure visit Cephus Collin, MD [facility]  Showing recent visits within past 90 days and meeting all other requirements Today's Visits Date Type Provider Dept  04/10/24 Procedure visit Cephus Collin, MD [facility]  Showing today's visits and meeting all other requirements Future Appointments No visits were found meeting these conditions. Showing future appointments within next 90 days and meeting all other requirements  Disposition: Discharge home  Discharge (Date  Time): 04/10/2024; 1121 hrs.   Primary Care Physician: Felicita Horns, FNP Location: [facility] Note by: Cephus Collin, MD (TTS technology used. I apologize for any typographical errors that were not detected and corrected.) Date: 04/10/2024; Time: 11:22 AM  Disclaimer:  Medicine is not an Visual merchandiser. The only guarantee in medicine is that nothing is guaranteed. It is important to note that the decision to proceed with this intervention was based on the information collected from the patient. The Data and conclusions were drawn from the patient's questionnaire, the interview, and the physical examination. Because the information was provided in large part by the patient, it cannot be guaranteed that it has not been purposely or unconsciously manipulated. Every effort has been made  to obtain as much relevant data as possible for this evaluation. It is important to note that the conclusions that lead to this procedure are derived in large part from the available data. Always take into account that the treatment will also be dependent on availability of resources and existing treatment guidelines, considered by other Pain Management Practitioners as being common knowledge and practice, at the time of the intervention. For Medico-Legal purposes, it is also important to point out that variation in procedural techniques and pharmacological choices are the acceptable norm. The indications, contraindications, technique, and results of the above procedure should only be interpreted and  judged by a Board-Certified Interventional Pain Specialist with extensive familiarity and expertise in the same exact procedure and technique.

## 2024-04-11 ENCOUNTER — Telehealth: Payer: Self-pay

## 2024-04-11 NOTE — Telephone Encounter (Signed)
 No issues post-procedure.

## 2024-04-17 DIAGNOSIS — J301 Allergic rhinitis due to pollen: Secondary | ICD-10-CM | POA: Diagnosis not present

## 2024-04-24 DIAGNOSIS — J301 Allergic rhinitis due to pollen: Secondary | ICD-10-CM | POA: Diagnosis not present

## 2024-05-01 DIAGNOSIS — J301 Allergic rhinitis due to pollen: Secondary | ICD-10-CM | POA: Diagnosis not present

## 2024-05-03 DIAGNOSIS — J301 Allergic rhinitis due to pollen: Secondary | ICD-10-CM | POA: Diagnosis not present

## 2024-05-09 ENCOUNTER — Telehealth: Payer: Self-pay | Admitting: *Deleted

## 2024-05-09 ENCOUNTER — Ambulatory Visit
Attending: Student in an Organized Health Care Education/Training Program | Admitting: Student in an Organized Health Care Education/Training Program

## 2024-05-09 ENCOUNTER — Telehealth: Payer: Self-pay | Admitting: Student in an Organized Health Care Education/Training Program

## 2024-05-09 DIAGNOSIS — M5416 Radiculopathy, lumbar region: Secondary | ICD-10-CM | POA: Diagnosis not present

## 2024-05-09 DIAGNOSIS — G894 Chronic pain syndrome: Secondary | ICD-10-CM

## 2024-05-09 DIAGNOSIS — M48061 Spinal stenosis, lumbar region without neurogenic claudication: Secondary | ICD-10-CM | POA: Diagnosis not present

## 2024-05-09 DIAGNOSIS — M65331 Trigger finger, right middle finger: Secondary | ICD-10-CM

## 2024-05-09 DIAGNOSIS — M792 Neuralgia and neuritis, unspecified: Secondary | ICD-10-CM

## 2024-05-09 DIAGNOSIS — G8929 Other chronic pain: Secondary | ICD-10-CM

## 2024-05-09 DIAGNOSIS — M51369 Other intervertebral disc degeneration, lumbar region without mention of lumbar back pain or lower extremity pain: Secondary | ICD-10-CM | POA: Diagnosis not present

## 2024-05-09 DIAGNOSIS — Z981 Arthrodesis status: Secondary | ICD-10-CM

## 2024-05-09 DIAGNOSIS — M5412 Radiculopathy, cervical region: Secondary | ICD-10-CM | POA: Diagnosis not present

## 2024-05-09 DIAGNOSIS — M65332 Trigger finger, left middle finger: Secondary | ICD-10-CM

## 2024-05-09 NOTE — Telephone Encounter (Signed)
 Attempted to call for ore /vv nursing questions. No answer at home/mobile number, unable to leave a message.  Message left at work number.

## 2024-05-09 NOTE — Progress Notes (Signed)
 PROVIDER NOTE: Interpretation of information contained herein should be left to medically-trained personnel. Specific patient instructions are provided elsewhere under Patient Instructions section of medical record. This document was created in part using AI and STT-dictation technology, any transcriptional errors that may result from this process are unintentional.  Patient: Heather Beasley  Service: E/M   PCP: Corwin Antu, FNP  DOB: 1956/05/18  DOS: 05/09/2024  Provider: Wallie Sherry, MD  MRN: [id number]  Delivery: Virtual Visit  Specialty: Interventional Pain Management  Type: Established Patient  Setting: Ambulatory outpatient facility  Specialty designation: 09  Referring Prov.: Corwin Antu, FNP  Location: Remote location       Virtual Encounter - Pain Management PROVIDER NOTE: Information contained herein reflects review and annotations entered in association with encounter. Interpretation of such information and data should be left to medically-trained personnel. Information provided to patient can be located elsewhere in the medical record under Patient Instructions. Document created using STT-dictation technology, any transcriptional errors that may result from process are unintentional.    Contact & Pharmacy Preferred: [phone] Home: [phone] (home) Mobile: [phone] (mobile) E-mail: [email or web address]  [facility] - [place], [place] - [address] [place] [address] Phone: [phone] Fax: [phone]   Pre-screening  [name] offered in-person vs virtual encounter. She indicated preferring virtual for this encounter.   Reason COVID-19*  Social distancing based on CDC and AMA recommendations.   I contacted CHERLYNN POPIEL on 05/09/2024 via telephone.      I clearly identified myself as Wallie Sherry, MD. I verified that I was speaking with the correct person using two identifiers (Name: Heather Beasley, and date of  birth: 1956/01/23).  Consent I sought verbal advanced consent from Heather Beasley for virtual visit interactions. I informed Ms. Degrazia of possible security and privacy concerns, risks, and limitations associated with providing not-in-person medical evaluation and management services. I also informed Ms. Gullickson of the availability of in-person appointments. Finally, I informed her that there would be a charge for the virtual visit and that she could be  personally, fully or partially, financially responsible for it. Ms. Kildow expressed understanding and agreed to proceed.   Historic Elements   [name] is a 68 y.o. year old, female patient evaluated today after our last contact on 05/09/2024. Ms. Ozanich  has a past medical history of Allergic rhinitis, Aortic atherosclerosis (HCC), Arthritis, Asthma, Bilateral carotid artery disease (HCC), Cancer of nasal cavity and sinus (HCC) (07/07/2015), Cervical spondylosis, Colitis, Colon polyps, COPD (chronic obstructive pulmonary disease) (HCC), Coronary artery disease, DDD (degenerative disc disease), cervical, Depression, Diastolic dysfunction, Diverticulosis, GERD (gastroesophageal reflux disease), Glaucoma, Headaches, cluster, Heart murmur, HTN (hypertension), Hyperlipidemia, IBS (irritable bowel syndrome), Kidney stones, Long term current use of antithrombotics/antiplatelets, Lumbar adjacent segment disease with spondylolisthesis, Melanoma (HCC) (1995), Neuropathy, Neuropathy of left lower extremity, Nose colonized with MRSA (02/16/2023), PFO (patent foramen ovale), Pneumonia, PONV (postoperative nausea and vomiting), and Tobacco abuse. She also  has a past surgical history that includes Appendectomy (1998); Abdominal hysterectomy (1998); Lumbar spine surgery (07/2004); Vaginal delivery; Cervical fusion (2010); Cholecystectomy (08/2011); Esophagogastroduodenoscopy (2012); Colonoscopy (N/A); Tonsillectomy (1968); Cystoscopy (N/A); Back surgery;  Dilation and curettage of uterus; Fracture surgery (2001); Image guided sinus surgery (N/A, 06/25/2015); Polypectomy (N/A, 06/25/2015); Oophorectomy; CAROTID PTA/STENT INTERVENTION (Left, 06/27/2020); Cataract extraction; Anterior lumbar fusion (N/A, 03/01/2023); and Application of intraoperative CT scan (N/A, 03/01/2023). Ms. Locatelli has a current medication list which includes the following prescription(s): acetaminophen , albuterol , atorvastatin , breztri  aerosphere,  cephalexin , cetirizine, vitamin d , clopidogrel , cyclobenzaprine , epinephrine , furosemide , losartan , montelukast , multiple vitamins-minerals, oxymetazoline , pantoprazole , and terbinafine . She  reports that she has been smoking e-cigarettes. She has never used smokeless tobacco. She reports that she does not currently use alcohol. She reports that she does not use drugs. Ms. Morel is allergic to albumin (human), amoxicillin-pot clavulanate, gabapentin, latex, betadine [povidone iodine ], sucralfate, amoxicillin, chantix [varenicline tartrate], erythromycin, sucralfate, and tegaderm ag mesh [silver].  BMI: Estimated body mass index is 29.62 kg/m as calculated from the following:   Height as of 04/10/24: 5' 5 (1.651 m).   Weight as of 04/10/24: 178 lb (80.7 kg). Last encounter: 03/13/2024. Last procedure: 04/10/2024.  HPI  Today, she is being contacted for a post-procedure assessment. Post-Procedure Evaluation   Procedure: Cervical Epidural Steroid injection (CESI) (Interlaminar) #2  Laterality: Right  Level: T1-2 Imaging: Fluoroscopy-assisted DOS: 04/10/2024  Performed by: Wallie Sherry, MD Anesthesia: Local anesthesia (1-2% Lidocaine )   Purpose: Diagnostic/Therapeutic Indications: Cervicalgia, cervical radicular pain, degenerative disc disease, severe enough to impact quality of life or function. 1. Chronic pain syndrome   2. S/P cervical spinal fusion   3. Cervical radicular pain    NAS-11 score:   Pre-procedure: 5 /10    Post-procedure: 0-No pain/10     Effectiveness:  Initial hour after procedure: 70 % (pain is located between shoulder blades and up towards cervical area)  Subsequent 4-6 hours post-procedure: 20 % (numbness had worn off)  Analgesia past initial 6 hours: 50 % (patient's pain is better in the daytime but becomes much worse in the afternoons and evening after activity which is much like pre-procedure.)  Ongoing improvement:  Analgesic:  50% Function: Somewhat improved ROM: Somewhat improved     Renal Lab Results  Component Value Date   BUN 17 03/16/2024   CREATININE 1.03 03/16/2024   BCR 16 01/12/2023   GFR 56.96 (L) 03/16/2024   GFRAA >60 06/28/2020   GFRNONAA >60 12/31/2021    Hepatic Lab Results  Component Value Date   AST 22 03/16/2024   ALT 13 03/16/2024   ALBUMIN 4.4 03/16/2024   ALKPHOS 89 03/16/2024   LIPASE 16 01/04/2012    Electrolytes Lab Results  Component Value Date   NA 142 03/16/2024   K 4.4 03/17/2024   CL 104 03/16/2024   CALCIUM  9.9 03/16/2024    Bone Lab Results  Component Value Date   VD25OH 43.63 08/25/2023    Inflammation (CRP: Acute Phase) (ESR: Chronic Phase) Lab Results  Component Value Date   ESRSEDRATE 2 11/17/2012   LATICACIDVEN 1.0 06/11/2011         Note: Above Lab results reviewed.    Assessment  The primary encounter diagnosis was Chronic pain syndrome. Diagnoses of Cervical radicular pain, Acquired trigger finger of both middle fingers, Radicular pain in right arm, Chronic radicular lumbar pain, and Lumbar spinal stenosis due to adjacent segment disease after fusion procedure (L4-S1 fusion, RIGHT L3/4) were also pertinent to this visit.  Plan of Care  Patient states that she is responding to her previous thoracic epidural steroid injection.  Results are detailed above.  She is requesting help with medication management.  She has tried various nonopioid-based analgesics and is currently on Tylenol  650 mg 3 times daily as  needed, Flexeril  10 mg twice daily as needed.  She is unable to tolerate NSAIDs given that she is on Plavix .  We will obtain a urine toxicology screen and I will see her after that to have her sign her controlled  substance agreement and discuss medication regimen that would be suitable and safe for her.  Patient endorsed understanding.  Repeat cervical ESI in the future.  Orders:  Orders Placed This Encounter  Procedures   Compliance Drug Analysis, Ur    Volume: 30 ml(s). Minimum 3 ml of urine is needed. Document temperature of fresh sample. Indications: Long term (current) use of opiate analgesic (S20.108) Test#: [id number] (Comprehensive Profile)    Release to patient:   Immediate   Follow-up plan:   Return in about 3 weeks (around 05/30/2024) for MM F2F (discuss initiation COT).      Right T1-2 ESI #2 04/10/24    Recent Visits Date Type Provider Dept  04/10/24 Procedure visit Marcelino Nurse, MD [facility]  03/13/24 Office Visit Marcelino Nurse, MD [facility]  02/14/24 Procedure visit Marcelino Nurse, MD [facility]  Showing recent visits within past 90 days and meeting all other requirements Today's Visits Date Type Provider Dept  05/09/24 Office Visit Marcelino Nurse, MD [facility]  Showing today's visits and meeting all other requirements Future Appointments No visits were found meeting these conditions. Showing future appointments within next 90 days and meeting all other requirements  I discussed the assessment and treatment plan with the patient. The patient was provided an opportunity to ask questions and all were answered. The patient agreed with the plan and demonstrated an understanding of the instructions.  Patient advised to call back or seek an in-person evaluation if the symptoms or condition worsens.  Duration of encounter: 30 minutes.  Note by: Nurse Marcelino, MD Date: 05/09/2024; Time: 3:52 PM

## 2024-05-09 NOTE — Telephone Encounter (Signed)
 PT called stated that she missed a called. Please give patient a call back. PT has VV, with Lateef

## 2024-05-15 ENCOUNTER — Other Ambulatory Visit: Payer: Self-pay | Admitting: Physician Assistant

## 2024-05-15 DIAGNOSIS — J301 Allergic rhinitis due to pollen: Secondary | ICD-10-CM | POA: Diagnosis not present

## 2024-05-15 DIAGNOSIS — G894 Chronic pain syndrome: Secondary | ICD-10-CM | POA: Diagnosis not present

## 2024-05-17 ENCOUNTER — Ambulatory Visit: Admitting: Dermatology

## 2024-05-17 DIAGNOSIS — L304 Erythema intertrigo: Secondary | ICD-10-CM | POA: Diagnosis not present

## 2024-05-17 DIAGNOSIS — L821 Other seborrheic keratosis: Secondary | ICD-10-CM

## 2024-05-17 DIAGNOSIS — D692 Other nonthrombocytopenic purpura: Secondary | ICD-10-CM | POA: Diagnosis not present

## 2024-05-17 DIAGNOSIS — W908XXA Exposure to other nonionizing radiation, initial encounter: Secondary | ICD-10-CM | POA: Diagnosis not present

## 2024-05-17 DIAGNOSIS — L578 Other skin changes due to chronic exposure to nonionizing radiation: Secondary | ICD-10-CM | POA: Diagnosis not present

## 2024-05-17 DIAGNOSIS — L814 Other melanin hyperpigmentation: Secondary | ICD-10-CM | POA: Diagnosis not present

## 2024-05-17 DIAGNOSIS — Z1283 Encounter for screening for malignant neoplasm of skin: Secondary | ICD-10-CM

## 2024-05-17 DIAGNOSIS — D229 Melanocytic nevi, unspecified: Secondary | ICD-10-CM

## 2024-05-17 DIAGNOSIS — Z85820 Personal history of malignant melanoma of skin: Secondary | ICD-10-CM

## 2024-05-17 DIAGNOSIS — L918 Other hypertrophic disorders of the skin: Secondary | ICD-10-CM | POA: Diagnosis not present

## 2024-05-17 DIAGNOSIS — D1801 Hemangioma of skin and subcutaneous tissue: Secondary | ICD-10-CM

## 2024-05-17 NOTE — Patient Instructions (Addendum)
 Seborrheic Keratosis  What causes seborrheic keratoses? Seborrheic keratoses are harmless, common skin growths that first appear during adult life.  As time goes by, more growths appear.  Some people may develop a large number of them.  Seborrheic keratoses appear on both covered and uncovered body parts.  They are not caused by sunlight.  The tendency to develop seborrheic keratoses can be inherited.  They vary in color from skin-colored to gray, brown, or even black.  They can be either smooth or have a rough, warty surface.   Seborrheic keratoses are superficial and look as if they were stuck on the skin.  Under the microscope this type of keratosis looks like layers upon layers of skin.  That is why at times the top layer may seem to fall off, but the rest of the growth remains and re-grows.    Treatment Seborrheic keratoses do not need to be treated, but can easily be removed in the office.  Seborrheic keratoses often cause symptoms when they rub on clothing or jewelry.  Lesions can be in the way of shaving.  If they become inflamed, they can cause itching, soreness, or burning.  Removal of a seborrheic keratosis can be accomplished by freezing, burning, or surgery. If any spot bleeds, scabs, or grows rapidly, please return to have it checked, as these can be an indication of a skin cancer.   Melanoma ABCDEs  Melanoma is the most dangerous type of skin cancer, and is the leading cause of death from skin disease.  You are more likely to develop melanoma if you: Have light-colored skin, light-colored eyes, or red or blond hair Spend a lot of time in the sun Tan regularly, either outdoors or in a tanning bed Have had blistering sunburns, especially during childhood Have a close family member who has had a melanoma Have atypical moles or large birthmarks  Early detection of melanoma is key since treatment is typically straightforward and cure rates are extremely high if we catch it early.    The first sign of melanoma is often a change in a mole or a new dark spot.  The ABCDE system is a way of remembering the signs of melanoma.  A for asymmetry:  The two halves do not match. B for border:  The edges of the growth are irregular. C for color:  A mixture of colors are present instead of an even brown color. D for diameter:  Melanomas are usually (but not always) greater than 6mm - the size of a pencil eraser. E for evolution:  The spot keeps changing in size, shape, and color.  Please check your skin once per month between visits. You can use a small mirror in front and a large mirror behind you to keep an eye on the back side or your body.   If you see any new or changing lesions before your next follow-up, please call to schedule a visit.  Please continue daily skin protection including broad spectrum sunscreen SPF 30+ to sun-exposed areas, reapplying every 2 hours as needed when you're outdoors.   Staying in the shade or wearing long sleeves, sun glasses (UVA+UVB protection) and wide brim hats (4-inch brim around the entire circumference of the hat) are also recommended for sun protection.    Due to recent changes in healthcare laws, you may see results of your pathology and/or laboratory studies on MyChart before the doctors have had a chance to review them. We understand that in some cases there may  be results that are confusing or concerning to you. Please understand that not all results are received at the same time and often the doctors may need to interpret multiple results in order to provide you with the best plan of care or course of treatment. Therefore, we ask that you please give [place] 2 business days to thoroughly review all your results before contacting the office for clarification. Should we see a critical lab result, you will be contacted sooner.   If You Need Anything After Your Visit  If you have any questions or concerns for your doctor, please call our main  line at [phone] and press option 4 to reach your doctor's medical assistant. If no one answers, please leave a voicemail as directed and we will return your call as soon as possible. Messages left after 4 pm will be answered the following business day.   You may also send [place] a message via MyChart. We typically respond to MyChart messages within 1-2 business days.  For prescription refills, please ask your pharmacy to contact our office. Our fax number is [phone].  If you have an urgent issue when the clinic is closed that cannot wait until the next business day, you can page your doctor at the number below.    Please note that while we do our best to be available for urgent issues outside of office hours, we are not available 24/7.   If you have an urgent issue and are unable to reach [place], you may choose to seek medical care at your doctor's office, retail clinic, urgent care center, or emergency room.  If you have a medical emergency, please immediately call 911 or go to the emergency department.  Pager Numbers  - Dr. Gwen Pounds: [phone]  - Dr. Roseanne Reno: [phone]  - Dr. Katrinka Blazing: [phone]   In the event of inclement weather, please call our main line at [phone] for an update on the status of any delays or closures.  Dermatology Medication Tips: Please keep the boxes that topical medications come in in order to help keep track of the instructions about where and how to use these. Pharmacies typically print the medication instructions only on the boxes and not directly on the medication tubes.   If your medication is too expensive, please contact our office at [phone] option 4 or send [place] a message through MyChart.   We are unable to tell what your co-pay for medications will be in advance as this is different depending on your insurance coverage. However, we may be able to find a substitute medication at lower cost or fill out paperwork to get insurance to cover  a needed medication.   If a prior authorization is required to get your medication covered by your insurance company, please allow [place] 1-2 business days to complete this process.  Drug prices often vary depending on where the prescription is filled and some pharmacies may offer cheaper prices.  The website www.goodrx.com contains coupons for medications through different pharmacies. The prices here do not account for what the cost may be with help from insurance (it may be cheaper with your insurance), but the website can give you the price if you did not use any insurance.  - You can print the associated coupon and take it with your prescription to the pharmacy.  - You may also stop by our office during regular business hours and pick up a GoodRx coupon card.  - If you need your prescription sent electronically to  a different pharmacy, notify our office through [facility] or by phone at [phone] option 4.     Si Usted Necesita Algo Despus de Su Visita  Tambin puede enviarnos un mensaje a travs de Clinical cytogeneticist. Por lo general respondemos a los mensajes de MyChart en el transcurso de 1 a 2 das hbiles.  Para renovar recetas, por favor pida a su farmacia que se ponga en contacto con nuestra oficina. Annie Sable de fax es [place] [phone].  Si tiene un asunto urgente cuando la clnica est cerrada y que no puede esperar hasta el siguiente da hbil, puede llamar/localizar a su doctor(a) al nmero que aparece a continuacin.   Por favor, tenga en cuenta que aunque hacemos todo lo posible para estar disponibles para asuntos urgentes fuera del horario de [place], no estamos disponibles las 24 horas del da, los 7 [address]  [address] de la [place].   Si tiene un problema urgente y no puede comunicarse con nosotros, puede optar por buscar atencin mdica  en el consultorio de su doctor(a), en una clnica privada, en un centro de atencin urgente o en una sala de emergencias.  Si tiene Psychologist, clinical, por favor llame inmediatamente al 911 o vaya a la sala de emergencias.  Nmeros de bper  - Dr. Gwen Pounds: [phone]  - Dra. Roseanne Reno: [phone]  - Dr. Katrinka Blazing: [phone]   En caso de inclemencias del tiempo, por favor llame a Lacy Duverney principal al [phone] para una actualizacin sobre el [place] de cualquier retraso o cierre.  Consejos para la medicacin en dermatologa: Por favor, guarde las cajas en las que vienen los medicamentos de uso tpico para ayudarle a seguir las instrucciones sobre dnde y cmo usarlos. Las farmacias generalmente imprimen las instrucciones del medicamento slo en las cajas y no directamente en los tubos del [place].   Si su medicamento es muy caro, por favor, pngase en contacto con Rolm Gala llamando al [phone] y presione la opcin 4 o envenos un mensaje a travs de Clinical cytogeneticist.   No podemos decirle cul ser su copago por los medicamentos por adelantado ya que esto es diferente dependiendo de la cobertura de su seguro. Sin embargo, es posible que podamos encontrar un medicamento sustituto a Audiological scientist un formulario para que el seguro cubra el medicamento que se considera necesario.   Si se requiere una autorizacin previa para que su compaa de seguros [place] su medicamento, por favor permtanos de 1 a 2 das hbiles para completar [address]  Los precios de los medicamentos varan con frecuencia dependiendo del Environmental consultant de dnde se surte la receta y alguna farmacias pueden ofrecer precios ms baratos.  El sitio web www.goodrx.com tiene cupones para medicamentos de Health and safety inspector. Los precios aqu no tienen en cuenta lo que podra costar con la ayuda del seguro (puede ser ms barato con su seguro), pero el sitio web puede darle el precio si no utiliz Tourist information centre manager.  - Puede imprimir el cupn correspondiente y llevarlo con su receta a la farmacia.  - Tambin puede pasar por nuestra oficina durante el horario de  atencin regular y Education officer, museum una tarjeta de cupones de GoodRx.  - Si necesita que su receta se enve electrnicamente a una farmacia diferente, informe a nuestra oficina a travs de MyChart de [place] o por telfono llamando al [phone] y presione la opcin 4.

## 2024-05-17 NOTE — Progress Notes (Signed)
 Follow-Up Visit   Subjective  Heather Beasley is a 68 y.o. female who presents for the following: Skin Cancer Screening and Full Body Skin Exam Hx of aks, hx of melanoma, hx of intertrigo  Noticed some spots at right side face.   Patient reports that neither cream help with rash in groin, has been using Dr, Vernon diaper rash ointment   The patient presents for Total-Body Skin Exam (TBSE) for skin cancer screening and mole check. The patient has spots, moles and lesions to be evaluated, some may be new or changing and the patient may have concern these could be cancer.  The following portions of the chart were reviewed this encounter and updated as appropriate: medications, allergies, medical history  Review of Systems:  No other skin or systemic complaints except as noted in HPI or Assessment and Plan.  Objective  Well appearing patient in no apparent distress; mood and affect are within normal limits.  A full examination was performed including scalp, head, eyes, ears, nose, lips, neck, chest, axillae, abdomen, back, buttocks, bilateral upper extremities, bilateral lower extremities, hands, feet, fingers, toes, fingernails, and toenails. All findings within normal limits unless otherwise noted below.   Relevant physical exam findings are noted in the Assessment and Plan.    Assessment & Plan   SKIN CANCER SCREENING PERFORMED TODAY.  ACTINIC DAMAGE - Chronic condition, secondary to cumulative UV/sun exposure - diffuse scaly erythematous macules with underlying dyspigmentation - Recommend daily broad spectrum sunscreen SPF 30+ to sun-exposed areas, reapply every 2 hours as needed.  - Staying in the shade or wearing long sleeves, sun glasses (UVA+UVB protection) and wide brim hats (4-inch brim around the entire circumference of the hat) are also recommended for sun protection.  - Call for new or changing lesions.  LENTIGINES, SEBORRHEIC KERATOSES, HEMANGIOMAS - Benign normal  skin lesions - Benign-appearing - Call for any changes SKs at right cheek area - discussed if cosmetic removal 60 for 1 and 15 for each additional one-  If bothersome insurance would cover to have treated   MELANOCYTIC NEVI - Tan-brown and/or pink-flesh-colored symmetric macules and papules - Benign appearing on exam today - Observation - Call clinic for new or changing moles - Recommend daily use of broad spectrum spf 30+ sunscreen to sun-exposed areas.   Purpura - Chronic; persistent and recurrent.  Treatable, but not curable. - Violaceous macules and patches - Benign - Related to trauma, age, sun damage and/or use of blood thinners, chronic use of topical and/or oral steroids - Observe - Can use OTC arnica containing moisturizer such as Dermend Bruise Formula if desired - Call for worsening or other concerns  Acrochordons (Skin Tags) neck - Fleshy, skin-colored pedunculated papules - Benign appearing.  - Observe. - If desired, they can be removed with an in office procedure that is not covered by insurance. - Please call the clinic if you notice any new or changing lesions.   INTERTRIGO Exam Pink patches of groin areas and inframammary Chronic and persistent condition with duration or expected duration over one year. Condition is symptomatic / bothersome to patient. Not to goal. Intertrigo is a chronic recurrent rash that occurs in skin fold areas that may be associated with friction; heat; moisture; yeast; fungus; and bacteria.  It is exacerbated by increased movement / activity; sweating; and higher atmospheric temperature.   Treatment Plan  Patient is using Dr. Vernon diaper rash cream and feels is helping with rash  Defers refills of prescription treatment  at this time Ketoconazole  2% cream qd Monday, Wednesday, Friday prn flares Hydrocortisone  2.5% cream qd Tuesday, Thursday, Saturday prn flares   HISTORY OF MELANOMA -1995 above the left knee Dr. Jakie - No  evidence of recurrence today - No lymphadenopathy - Recommend regular full body skin exams - Recommend daily broad spectrum sunscreen SPF 30+ to sun-exposed areas, reapply every 2 hours as needed.  - Call if any new or changing lesions are noted between office visits  SKIN CANCER SCREENING   ACTINIC SKIN DAMAGE   LENTIGO   MELANOCYTIC NEVUS, UNSPECIFIED LOCATION   PURPURA (HCC)   SKIN TAGS, MULTIPLE ACQUIRED   INTERTRIGO   HISTORY OF MALIGNANT MELANOMA   Return in about 1 year (around 05/17/2025) for TBSE.  IEleanor Blush, CMA, am acting as scribe for Alm Rhyme, MD.   Documentation: I have reviewed the above documentation for accuracy and completeness, and I agree with the above.  Alm Rhyme, MD

## 2024-05-18 LAB — COMPLIANCE DRUG ANALYSIS, UR

## 2024-05-22 DIAGNOSIS — J301 Allergic rhinitis due to pollen: Secondary | ICD-10-CM | POA: Diagnosis not present

## 2024-05-23 ENCOUNTER — Encounter: Payer: Self-pay | Admitting: Dermatology

## 2024-05-29 DIAGNOSIS — J301 Allergic rhinitis due to pollen: Secondary | ICD-10-CM | POA: Diagnosis not present

## 2024-05-30 ENCOUNTER — Ambulatory Visit
Attending: Student in an Organized Health Care Education/Training Program | Admitting: Student in an Organized Health Care Education/Training Program

## 2024-05-30 ENCOUNTER — Encounter: Payer: Self-pay | Admitting: Student in an Organized Health Care Education/Training Program

## 2024-05-30 VITALS — BP 149/77 | HR 79 | Temp 97.3°F | Resp 17 | Ht 65.0 in | Wt 188.0 lb

## 2024-05-30 DIAGNOSIS — M792 Neuralgia and neuritis, unspecified: Secondary | ICD-10-CM | POA: Diagnosis not present

## 2024-05-30 DIAGNOSIS — M65332 Trigger finger, left middle finger: Secondary | ICD-10-CM | POA: Insufficient documentation

## 2024-05-30 DIAGNOSIS — M5416 Radiculopathy, lumbar region: Secondary | ICD-10-CM | POA: Insufficient documentation

## 2024-05-30 DIAGNOSIS — Z0289 Encounter for other administrative examinations: Secondary | ICD-10-CM | POA: Diagnosis present

## 2024-05-30 DIAGNOSIS — M65331 Trigger finger, right middle finger: Secondary | ICD-10-CM | POA: Diagnosis not present

## 2024-05-30 DIAGNOSIS — M5412 Radiculopathy, cervical region: Secondary | ICD-10-CM | POA: Diagnosis not present

## 2024-05-30 DIAGNOSIS — Z981 Arthrodesis status: Secondary | ICD-10-CM | POA: Insufficient documentation

## 2024-05-30 DIAGNOSIS — G8929 Other chronic pain: Secondary | ICD-10-CM | POA: Insufficient documentation

## 2024-05-30 DIAGNOSIS — M51369 Other intervertebral disc degeneration, lumbar region without mention of lumbar back pain or lower extremity pain: Secondary | ICD-10-CM | POA: Diagnosis not present

## 2024-05-30 DIAGNOSIS — M48061 Spinal stenosis, lumbar region without neurogenic claudication: Secondary | ICD-10-CM | POA: Insufficient documentation

## 2024-05-30 DIAGNOSIS — G894 Chronic pain syndrome: Secondary | ICD-10-CM | POA: Insufficient documentation

## 2024-05-30 MED ORDER — BUPRENORPHINE 5 MCG/HR TD PTWK: 1.0000 | MEDICATED_PATCH | TRANSDERMAL | 0 refills | Status: AC

## 2024-05-30 MED ORDER — BUPRENORPHINE 7.5 MCG/HR TD PTWK: 1.0000 | MEDICATED_PATCH | TRANSDERMAL | 0 refills | Status: DC

## 2024-05-30 NOTE — Patient Instructions (Addendum)
 Please complete prior auth for patient Have pt sign controlled substance agreemtn

## 2024-05-30 NOTE — Progress Notes (Signed)
 Safety precautions to be maintained throughout the outpatient stay will include: orient to surroundings, keep bed in low position, maintain call bell within reach at all times, provide assistance with transfer out of bed and ambulation.

## 2024-05-30 NOTE — Progress Notes (Signed)
 PROVIDER NOTE: Interpretation of information contained herein should be left to medically-trained personnel. Specific patient instructions are provided elsewhere under Patient Instructions section of medical record. This document was created in part using AI and STT-dictation technology, any transcriptional errors that may result from this process are unintentional.  Patient: Heather Beasley  Service: E/M   PCP: Corwin Antu, FNP  DOB: [birth year]  DOS: 05/30/2024  Provider: Wallie Sherry, MD  MRN: [id number]  Delivery: Face-to-face  Specialty: Interventional Pain Management  Type: Established Patient  Setting: Ambulatory outpatient facility  Specialty designation: 09  Referring Prov.: Corwin Antu, FNP  Location: Outpatient office facility       History of present illness (HPI) [name], a 68 y.o. year old female, is here today because of her Chronic pain syndrome [G89.4]. Heather Beasley primary complain today is Back Pain (upper)  Pertinent problems: Heather Beasley does not have any pertinent problems on file.  Pain Assessment: Severity of   is reported as a 3 /10. Location: Back Upper/to front under rib cage. Onset: More than a month ago. Quality: Aching, Constant, Sore, Dull, Stabbing. Timing: Constant. Modifying factor(s): laying downm, rest. Vitals:  height is 5' 5 (1.651 m) and weight is 188 lb (85.3 kg). Her temperature is 97.3 F (36.3 C) (abnormal). Her blood pressure is 149/77 (abnormal) and her pulse is 79. Her respiration is 17 and oxygen saturation is 96%.  BMI: Estimated body mass index is 31.28 kg/m as calculated from the following:   Height as of this encounter: 5' 5 (1.651 m).   Weight as of this encounter: 188 lb (85.3 kg).  Last encounter: 05/09/2024. Last procedure: 04/10/2024.  Reason for encounter: 2nd pt visit- discuss MM  History of Present Illness   Heather Beasley is a 68 year old female who presents for discussion of pain management medications.  She has  a history of low back surgery in May of the previous year, after which she was prescribed oxycodone . She did not take the full prescription and only used some of it. Currently, she manages her pain with Flexeril , a muscle relaxer, and Tylenol . She has previously tried oxycodone  and is uncertain about the use of tramadol or hydrocodone .  She has a known latex allergy, which is relevant to her treatment considerations, as she experienced an allergic reaction to a dressing applied after her surgery.  She mentions a family history of cardiovascular issues, which has led to her being prescribed Wegovy  for weight management, although she finds the cost prohibitive even after insurance coverage.       Pharmacotherapy Assessment   Start Butrans  patch as below Monitoring: [place] PMP: PDMP reviewed during this encounter.       Pharmacotherapy: No side-effects or adverse reactions reported. Compliance: No problems identified. Effectiveness: Clinically acceptable.  Dayna Pulling, RN  05/30/2024 10:41 AM  Sign when Signing Visit Safety precautions to be maintained throughout the outpatient stay will include: orient to surroundings, keep bed in low position, maintain call bell within reach at all times, provide assistance with transfer out of bed and ambulation.   UDS:  Summary  Date Value Ref Range Status  05/15/2024 FINAL  Final    Comment:    ==================================================================== Compliance Drug Analysis, Ur ==================================================================== Test                             Result       Flag  Units  Drug Present and Declared for Prescription Verification   Desmethylcyclobenzaprine       PRESENT      EXPECTED    Desmethylcyclobenzaprine is an expected metabolite of    cyclobenzaprine .    Acetaminophen                   PRESENT      EXPECTED ==================================================================== Test                       Result    Flag   Units      Ref Range   Creatinine              65               mg/dL      >=79 ==================================================================== Declared Medications:  The flagging and interpretation on this report are based on the  following declared medications.  Unexpected results may arise from  inaccuracies in the declared medications.   **Note: The testing scope of this panel includes these medications:   Cyclobenzaprine  (Flexeril )   **Note: The testing scope of this panel does not include small to  moderate amounts of these reported medications:   Acetaminophen  (Tylenol )   **Note: The testing scope of this panel does not include the  following reported medications:   Albuterol  (Ventolin  HFA)  Atorvastatin  (Lipitor)  Budesonide (Breztri  Aerosphere)  Cephalexin  (Keflex )  Cetirizine (Zyrtec)  Clopidogrel  (Plavix )  Epinephrine  (EpiPen )  Formoterol  (Breztri  Aerosphere)  Furosemide  (Lasix )  Glycopyrrolate  (Breztri  Aerosphere)  Losartan  (Cozaar )  Montelukast  (Singulair )  Multivitamin  Oxymetazoline  (Afrin)  Pantoprazole  (Protonix )  Terbinafine  (Lamisil )  Vitamin D  ==================================================================== For clinical consultation, please call [phone]. ====================================================================     No results found for: CBDTHCR No results found for: D8THCCBX No results found for: D9THCCBX  ROS  Constitutional: Denies any fever or chills Gastrointestinal: No reported hemesis, hematochezia, vomiting, or acute GI distress Musculoskeletal: Low back pain, right leg pain Neurological: No reported episodes of acute onset apraxia, aphasia, dysarthria, agnosia, amnesia, paralysis, loss of coordination, or loss of consciousness  Medication Review  EPINEPHrine , Multiple Vitamins-Minerals, Vitamin D , acetaminophen , albuterol , atorvastatin , budesonide-glycopyrrolate -formoterol ,  buprenorphine , cephALEXin , cetirizine, clopidogrel , cyclobenzaprine , furosemide , losartan , montelukast , oxymetazoline , and pantoprazole   History Review  Allergy: Heather Beasley is allergic to albumin (human), amoxicillin-pot clavulanate, gabapentin, latex, betadine [povidone iodine ], sucralfate, amoxicillin, chantix [varenicline tartrate], erythromycin, sucralfate, and tegaderm ag mesh [silver]. Drug: Heather Beasley  reports no history of drug use. Alcohol:  reports that she does not currently use alcohol. Tobacco:  reports that she has been smoking e-cigarettes. She has never used smokeless tobacco. Social: Ms. Ask  reports that she has been smoking e-cigarettes. She has never used smokeless tobacco. She reports that she does not currently use alcohol. She reports that she does not use drugs. Medical:  has a past medical history of Allergic rhinitis, Aortic atherosclerosis (HCC), Arthritis, Asthma, Bilateral carotid artery disease (HCC), Cancer of nasal cavity and sinus (HCC) (07/07/2015), Cervical spondylosis, Colitis, Colon polyps, COPD (chronic obstructive pulmonary disease) (HCC), Coronary artery disease, DDD (degenerative disc disease), cervical, Depression, Diastolic dysfunction, Diverticulosis, GERD (gastroesophageal reflux disease), Glaucoma, Headaches, cluster, Heart murmur, HTN (hypertension), Hyperlipidemia, IBS (irritable bowel syndrome), Kidney stones, Long term current use of antithrombotics/antiplatelets, Lumbar adjacent segment disease with spondylolisthesis, Melanoma (HCC) (1995), Neuropathy, Neuropathy of left lower extremity, Nose colonized with MRSA (02/16/2023), PFO (patent foramen ovale), Pneumonia, PONV (postoperative nausea and vomiting), and Tobacco abuse. Surgical: Heather Beasley  has a  past surgical history that includes Appendectomy (1998); Abdominal hysterectomy (1998); Lumbar spine surgery (07/2004); Vaginal delivery; Cervical fusion (2010); Cholecystectomy (08/2011);  Esophagogastroduodenoscopy (2012); Colonoscopy (N/A); Tonsillectomy (1968); Cystoscopy (N/A); Back surgery; Dilation and curettage of uterus; Fracture surgery (2001); Image guided sinus surgery (N/A, 06/25/2015); Polypectomy (N/A, 06/25/2015); Oophorectomy; CAROTID PTA/STENT INTERVENTION (Left, 06/27/2020); Cataract extraction; Anterior lumbar fusion (N/A, 03/01/2023); and Application of intraoperative CT scan (N/A, 03/01/2023). Family: family history includes Breast cancer (age of onset: 69) in her paternal grandmother; Cancer in her father and paternal grandmother; Colon cancer in her father; Coronary artery disease in her paternal grandmother; Diabetes in her mother and paternal grandmother; Heart failure in her paternal grandmother; Hyperlipidemia in her mother; Hypertension in her mother; Pancreatic cancer in her father; Stroke in her brother.  Laboratory Chemistry Profile   Renal Lab Results  Component Value Date   BUN 17 03/16/2024   CREATININE 1.03 03/16/2024   BCR 16 01/12/2023   GFR 56.96 (L) 03/16/2024   GFRAA >60 06/28/2020   GFRNONAA >60 12/31/2021    Hepatic Lab Results  Component Value Date   AST 22 03/16/2024   ALT 13 03/16/2024   ALBUMIN 4.4 03/16/2024   ALKPHOS 89 03/16/2024   LIPASE 16 01/04/2012    Electrolytes Lab Results  Component Value Date   NA 142 03/16/2024   K 4.4 03/17/2024   CL 104 03/16/2024   CALCIUM  9.9 03/16/2024    Bone Lab Results  Component Value Date   VD25OH 43.63 08/25/2023    Inflammation (CRP: Acute Phase) (ESR: Chronic Phase) Lab Results  Component Value Date   ESRSEDRATE 2 11/17/2012   LATICACIDVEN 1.0 06/11/2011         Note: Above Lab results reviewed.  Recent Imaging Review  DG PAIN CLINIC C-ARM 1-60 MIN NO REPORT Fluoro was used, but no Radiologist interpretation will be provided.  Please refer to NOTES tab for provider progress note. Note: Reviewed        Physical Exam  Vitals: BP (!) 149/77   Pulse 79   Temp (!)  97.3 F (36.3 C)   Resp 17   Ht 5' 5 (1.651 m)   Wt 188 lb (85.3 kg)   SpO2 96%   BMI 31.28 kg/m  BMI: Estimated body mass index is 31.28 kg/m as calculated from the following:   Height as of this encounter: 5' 5 (1.651 m).   Weight as of this encounter: 188 lb (85.3 kg). Ideal: Ideal body weight: 57 kg (125 lb 10.6 oz) Adjusted ideal body weight: 68.3 kg (150 lb 9.6 oz) General appearance: Well nourished, well developed, and well hydrated. In no apparent acute distress Mental status: Alert, oriented x 3 (person, place, & time)       Respiratory: No evidence of acute respiratory distress Eyes: PERLA  Low back pain with radiation into right leg  Assessment   Diagnosis  1. Chronic pain syndrome   2. Cervical radicular pain   3. Acquired trigger finger of both middle fingers   4. Radicular pain in right arm   5. Chronic radicular lumbar pain   6. Lumbar spinal stenosis due to adjacent segment disease after fusion procedure (L4-S1 fusion, RIGHT L3/4)   7. Pain management contract signed      Updated Problems: No problems updated.  Plan of Care  Problem-specific:  Assessment and Plan    Chronic pain syndrome   Chronic pain syndrome is managed with Flexeril  and Tylenol . Oxycodone  post-surgery was ineffective for chronic pain. A  buprenorphine  (Butrans ) patch is recommended as a safer long-term management option. Prescribe the buprenorphine  patch starting at 5 mcg/hr, increasing to 7.5 mcg/hr in the second month. Instruct her to apply the patch weekly, alternating sites. Advise monitoring for side effects such as cognitive and mood changes, nausea, and constipation. She should stop the patch and call if side effects persist beyond three days. Discussed cost and insurance coverage issues. Consider tramadol if buprenorphine  is ineffective or unaffordable. Send the prescription to [facility] pharmacy and initiate prior authorization for the buprenorphine  patch.  Latex allergy    Confirmed latex allergy. Ensure the buprenorphine  patch is latex-free to prevent allergic reactions.       Heather Beasley has a current medication list which includes the following long-term medication(s): albuterol , atorvastatin , furosemide , losartan , montelukast , and pantoprazole .  Pharmacotherapy (Medications Ordered): Meds ordered this encounter  Medications   buprenorphine  (BUTRANS ) 5 MCG/HR PTWK    Sig: Place 1 patch onto the skin once a week for 28 days.    Dispense:  4 patch    Refill:  0   buprenorphine  (BUTRANS ) 7.5 MCG/HR    Sig: Place 1 patch onto the skin once a week for 28 days.    Dispense:  4 patch    Refill:  0   Orders:  No orders of the defined types were placed in this encounter.    Right T1-2 ESI #2 04/10/24    Return in about 8 weeks (around 07/25/2024) for MM, F2F.    Recent Visits Date Type Provider Dept  05/09/24 Office Visit Marcelino Nurse, MD [facility]  04/10/24 Procedure visit Marcelino Nurse, MD [facility]  03/13/24 Office Visit Marcelino Nurse, MD [facility]  Showing recent visits within past 90 days and meeting all other requirements Today's Visits Date Type Provider Dept  05/30/24 Office Visit Marcelino Nurse, MD [facility]  Showing today's visits and meeting all other requirements Future Appointments Date Type Provider Dept  07/20/24 Appointment Marcelino Nurse, MD [facility]  Showing future appointments within next 90 days and meeting all other requirements  I discussed the assessment and treatment plan with the patient. The patient was provided an opportunity to ask questions and all were answered. The patient agreed with the plan and demonstrated an understanding of the instructions.  Patient advised to call back or seek an in-person evaluation if the symptoms or condition worsens.  Duration of encounter: .  Total time on encounter, as per AMA guidelines included both the  face-to-face and non-face-to-face time personally spent by the physician and/or other qualified health care professional(s) on the day of the encounter (includes time in activities that require the physician or other qualified health care professional and does not include time in activities normally performed by clinical staff). Physician's time may include the following activities when performed: Preparing to see the patient (e.g., pre-charting review of records, searching for previously ordered imaging, lab work, and nerve conduction tests) Review of prior analgesic pharmacotherapies. Reviewing PMP Interpreting ordered tests (e.g., lab work, imaging, nerve conduction tests) Performing post-procedure evaluations, including interpretation of diagnostic procedures Obtaining and/or reviewing separately obtained history Performing a medically appropriate examination and/or evaluation Counseling and educating the patient/family/caregiver Ordering medications, tests, or procedures Referring and communicating with other health care professionals (when not separately reported) Documenting clinical information in the electronic or other health record Independently interpreting results (not separately reported) and communicating results to the patient/ family/caregiver Care coordination (not separately reported)  Note  by: Wallie Sherry, MD (TTS and AI technology used. I apologize for any typographical errors that were not detected and corrected.) Date: 05/30/2024; Time: 11:52 AM

## 2024-06-01 ENCOUNTER — Other Ambulatory Visit (INDEPENDENT_AMBULATORY_CARE_PROVIDER_SITE_OTHER): Payer: Self-pay | Admitting: Vascular Surgery

## 2024-06-01 DIAGNOSIS — I6522 Occlusion and stenosis of left carotid artery: Secondary | ICD-10-CM

## 2024-06-02 ENCOUNTER — Ambulatory Visit (INDEPENDENT_AMBULATORY_CARE_PROVIDER_SITE_OTHER): Payer: Medicare PPO

## 2024-06-02 ENCOUNTER — Ambulatory Visit (INDEPENDENT_AMBULATORY_CARE_PROVIDER_SITE_OTHER): Payer: Medicare PPO | Admitting: Vascular Surgery

## 2024-06-02 VITALS — BP 134/83 | HR 81 | Ht 65.0 in | Wt 179.4 lb

## 2024-06-02 DIAGNOSIS — I6522 Occlusion and stenosis of left carotid artery: Secondary | ICD-10-CM

## 2024-06-02 DIAGNOSIS — E7800 Pure hypercholesterolemia, unspecified: Secondary | ICD-10-CM

## 2024-06-02 NOTE — Progress Notes (Signed)
 MRN : [id number]  Heather Beasley is a 68 y.o. (08-15-1956) female who presents with chief complaint of No chief complaint on file. SABRA  History of Present Illness: Patient returns today in follow up of her carotid disease.  She is about 4 years status post left carotid stent placement.  She is doing well.  No focal neurologic symptoms since her last visit. Specifically, the patient denies amaurosis fugax, speech or swallowing difficulties, or arm or leg weakness or numbness.  Carotid duplex today shows 1 to 39% right ICA stenosis and a widely patent left carotid stent.  Current Outpatient Medications  Medication Sig Dispense Refill   acetaminophen  (TYLENOL ) 650 MG CR tablet Take 650 mg by mouth every 8 (eight) hours as needed for pain.     albuterol  (VENTOLIN  HFA) 108 (90 Base) MCG/ACT inhaler Inhale 1-2 puffs into the lungs every 6 (six) hours as needed (for cough). 18 g 2   atorvastatin  (LIPITOR) 20 MG tablet TAKE ONE TABLET BY MOUTH ONCE A DAY 90 tablet 3   Budeson-Glycopyrrol-Formoterol  (BREZTRI  AEROSPHERE) 160-9-4.8 MCG/ACT AERO Inhale 2 puffs into the lungs in the morning and at bedtime. 11.8 g 0   buprenorphine  (BUTRANS ) 5 MCG/HR PTWK Place 1 patch onto the skin once a week for 28 days. 4 patch 0   [START ON 06/27/2024] buprenorphine  (BUTRANS ) 7.5 MCG/HR Place 1 patch onto the skin once a week for 28 days. 4 patch 0   cephALEXin  (KEFLEX ) 500 MG capsule Take 1 capsule (500 mg total) by mouth 3 (three) times daily. (Patient not taking: Reported on 05/30/2024) 15 capsule 0   cetirizine (ZYRTEC) 10 MG tablet Take 10 mg by mouth daily. Allergies.     Cholecalciferol (VITAMIN D ) 50 MCG (2000 UT) CAPS Take 2,000 Units by mouth.     clopidogrel  (PLAVIX ) 75 MG tablet Take 1 tablet (75 mg total) by mouth daily. 90 tablet 3   cyclobenzaprine  (FLEXERIL ) 10 MG tablet TAKE ONE TABLET (10 MG TOTAL) BY MOUTH THREE TIMES DAILY AS NEEDED FOR MUSCLE SPASMS. 90 tablet 0   EPINEPHrine  0.3 mg/0.3 mL IJ SOAJ  injection Inject 1 mg into the skin as needed.     furosemide  (LASIX ) 20 MG tablet Take 1 tablet (20 mg total) by mouth as needed for fluid. 30 tablet 0   losartan  (COZAAR ) 100 MG tablet Take 1 tablet (100 mg total) by mouth daily. 90 tablet 0   montelukast  (SINGULAIR ) 10 MG tablet TAKE ONE TABLET BY MOUTH AT BEDTIME 90 tablet 3   Multiple Vitamins-Minerals (CENTRUM ADULTS PO) Take 1 tablet by mouth.     oxymetazoline  (AFRIN) 0.05 % nasal spray Place 1 spray into both nostrils 2 (two) times daily as needed for congestion. Use for nose bleeds     pantoprazole  (PROTONIX ) 40 MG tablet Take 1 tablet (40 mg total) by mouth daily. 90 tablet 3   No current facility-administered medications for this visit.    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; T1 N0 M0; s/p adjuvant XRT; did not require systemic  chemotherapy   Cervical spondylosis    Colitis    Colon polyps    COPD (chronic obstructive pulmonary disease) (HCC)    Coronary artery disease    a.) cCTA 01/26/2022: Ca score 80.3 (82nd percentile for age/sex match control)   DDD (degenerative disc disease), cervical    a.) ACDF C3-C6   Depression    Diastolic dysfunction    a.) TTE 06/30/2013: EF 55-6%, mild LAE, mild MR, G1DD; b.) TTE 01/21/2022: EF 70-75%, mod LVH, mild MR, G1DD   Diverticulosis    GERD (gastroesophageal reflux disease)    Glaucoma    she reports that she does not have glacoma, she has thick corneas   Headaches, cluster    Heart murmur    HTN (hypertension)    Hyperlipidemia    IBS  (irritable bowel syndrome)    Kidney stones    Long term current use of antithrombotics/antiplatelets    a.) clopidogrel    Lumbar adjacent segment disease with spondylolisthesis    Melanoma (HCC) 1995   above the L knee    Neuropathy    Neuropathy of left lower extremity    Nose colonized with MRSA 02/16/2023   a.) surgical PCR (+) 02/16/2023 prior to L3-4 LATERAL LUMBAR INTERBODY FUSION; L3-S1 POSTERIOR SPINAL FUSION   PFO (patent foramen ovale)    a.) cCTA 01/26/2022 --> PFO with L to R contrast flow   Pneumonia    PONV (postoperative nausea and vomiting)    2012 - during lumbar fusion she developed spinal fluid leak, and had to have 3 units on Blood, BP dropped, and in ICU,   Tobacco abuse     Past Surgical History:  Procedure Laterality Date   ABDOMINAL HYSTERECTOMY  1998   ANTERIOR LUMBAR FUSION N/A 03/01/2023   Procedure: L3-4 LATERAL LUMBAR INTERBODY FUSION;  Surgeon: Clois Fret, MD;  Location: [facility];  Service: Neurosurgery;  Laterality: N/A;   APPENDECTOMY  1998   APPLICATION OF INTRAOPERATIVE CT SCAN N/A 03/01/2023   Procedure: APPLICATION OF INTRAOPERATIVE CT SCAN;  Surgeon: Clois Fret, MD;  Location: [facility];  Service: Neurosurgery;  Laterality: N/A;   BACK SURGERY     CAROTID PTA/STENT INTERVENTION Left 06/27/2020   Procedure: CAROTID PTA/STENT INTERVENTION;  Surgeon: Marea Selinda RAMAN, MD;  Location: [facility];  Service: Cardiovascular;  Laterality: Left;   CATARACT EXTRACTION     CERVICAL FUSION  2010   C3-C6   CHOLECYSTECTOMY  08/2011   Procedure: Cholecystectomy; Location: [facility]; Surgeon: Unknown Sharps, MD   COLONOSCOPY N/A    Procedure: COLONOSCOPY; Location: [facility]; Surgeon: Lamar Holmes, MD   CYSTOSCOPY N/A    Procedure: CYSTOSCOPY; Location: [facility]; Surgeon: Glendia Barba, MD   DILATION AND CURETTAGE OF UTERUS     ESOPHAGOGASTRODUODENOSCOPY  2012   Procedure: ESOPHAGOGASTRODUODENOSCOPY; Location: [facility]; Surgeon: Lamar Holmes, MD    FRACTURE SURGERY  2001   plate in right wrist and arm   IMAGE GUIDED SINUS SURGERY N/A 06/25/2015   Procedure: IMAGE GUIDED SINUS SURGERY;  Surgeon: Chinita Hasten, MD;  Location: [facility];  Service: ENT;  Laterality: N/A;   LUMBAR SPINE SURGERY  07/2004   OOPHORECTOMY     POLYPECTOMY N/A 06/25/2015   Procedure: POLYPECTOMY NASAL;  Surgeon: Chinita Hasten, MD;  Location: [facility];  Service: ENT;  Laterality: N/A;   TONSILLECTOMY  1968   VAGINAL DELIVERY     x2     Social History   Tobacco Use   Smoking status: Every Day  Types: E-cigarettes   Smokeless tobacco: Never   Tobacco comments:    Using vape with no nicotine    Vaping Use   Vaping status: Every Day   Substances: Flavoring  Substance Use Topics   Alcohol use: Not Currently   Drug use: No       Family History  Problem Relation Age of Onset   Hypertension Mother    Hyperlipidemia Mother    Diabetes Mother    Colon cancer Father    Pancreatic cancer Father    Cancer Father        Colon & Pancreatic   Breast cancer Paternal Grandmother 36   Diabetes Paternal Grandmother    Coronary artery disease Paternal Grandmother    Heart failure Paternal Grandmother    Cancer Paternal Grandmother        breast   Stroke Brother      Allergies  Allergen Reactions   Albumin (Human) Rash   Amoxicillin-Pot Clavulanate Hives   Gabapentin Shortness Of Breath   Latex Dermatitis and Rash   Betadine [Povidone Iodine ] Dermatitis, Rash and Other (See Comments)    Skin burning Topical betadine and iodine  have this reaction with patient.  IV contrast is NOT a problem.  jkl   Sucralfate     Other reaction(s): Other (See Comments) Abdominal Pain   Amoxicillin     REACTION: rash   Chantix [Varenicline Tartrate]     Heart racing   Erythromycin     REACTION: rash   Sucralfate Nausea And Vomiting    Heart racing Lightheaded    Tegaderm Ag Mesh [Silver] Hives    Mild      REVIEW OF SYSTEMS (Negative unless  checked)   Constitutional: [] Weight loss  [] Fever  [] Chills Cardiac: [] Chest pain   [] Chest pressure   [] Palpitations   [] Shortness of breath when laying flat   [] Shortness of breath at rest   [] Shortness of breath with exertion. Vascular:  [x] Pain in legs with walking   [x] Pain in legs at rest   [] Pain in legs when laying flat   [] Claudication   [] Pain in feet when walking  [] Pain in feet at rest  [] Pain in feet when laying flat   [] History of DVT   [] Phlebitis   [] Swelling in legs   [] Varicose veins   [] Non-healing ulcers Pulmonary:   [] Uses home oxygen   [] Productive cough   [] Hemoptysis   [] Wheeze  [x] COPD   [] Asthma Neurologic:  [] Dizziness  [] Blackouts   [] Seizures   [] History of stroke   [] History of TIA  [] Aphasia   [] Temporary blindness   [] Dysphagia   [] Weakness or numbness in arms   [] Weakness or numbness in legs Musculoskeletal:  [x] Arthritis   [] Joint swelling   [] Joint pain   [] Low back pain Hematologic:  [] Easy bruising  [] Easy bleeding   [] Hypercoagulable state   [] Anemic   Gastrointestinal:  [] Blood in stool   [] Vomiting blood  [x] Gastroesophageal reflux/heartburn   [] Abdominal pain Genitourinary:  [] Chronic kidney disease   [] Difficult urination  [] Frequent urination  [] Burning with urination   [] Hematuria Skin:  [] Rashes   [] Ulcers   [] Wounds Psychological:  [] History of anxiety   []  History of major depression.  Physical Examination  There were no vitals taken for this visit. Gen:  WD/WN, NAD Head: [place]/AT, No temporalis wasting. Ear/Nose/Throat: Hearing grossly intact, nares w/o erythema or drainage Eyes: Conjunctiva clear. Sclera non-icteric Neck: Supple.  Trachea midline. No carotid bruit. Pulmonary:  Good air movement, no use of  accessory muscles.  Cardiac: RRR, no JVD.  Vascular:  Vessel Right Left  Radial Palpable Palpable                          PT Palpable Palpable  DP Palpable Palpable   Gastrointestinal: soft, non-tender/non-distended. No  guarding/reflex.  Musculoskeletal: M/S 5/5 throughout.  No deformity or atrophy. No edema. Neurologic: Sensation grossly intact in extremities.  Symmetrical.  Speech is fluent.  Psychiatric: Judgment intact, Mood & affect appropriate for pt's clinical situation. Dermatologic: No rashes or ulcers noted.  No cellulitis or open wounds.      Labs Recent Results (from the past 2160 hours)  Hemoglobin A1c     Status: None   Collection Time: 03/16/24 10:29 AM  Result Value Ref Range   Hgb A1c MFr Bld 6.0 4.6 - 6.5 %    Comment: Glycemic Control Guidelines for People with Diabetes:Non Diabetic:  <6%Goal of Therapy: <7%Additional Action Suggested:  >8%   Vitamin B12     Status: Abnormal   Collection Time: 03/16/24 10:29 AM  Result Value Ref Range   Vitamin B-12 >1500 (H) 211 - 911 pg/mL  Lipid panel     Status: None   Collection Time: 03/16/24 10:29 AM  Result Value Ref Range   Cholesterol 129 0 - 200 mg/dL    Comment: ATP III Classification       Desirable:  < 200 mg/dL               Borderline High:  200 - 239 mg/dL          High:  > = 759 mg/dL   Triglycerides 29.9 0.0 - 149.0 mg/dL    Comment: Normal:  <849 mg/dLBorderline High:  150 - 199 mg/dL   HDL 35.59 >60.99 mg/dL   VLDL 85.9 0.0 - 59.9 mg/dL   LDL Cholesterol 50 0 - 99 mg/dL   Total CHOL/HDL Ratio 2     Comment:                Men          Women1/2 Average Risk     3.4          3.3Average Risk          5.0          4.42X Average Risk          9.6          7.13X Average Risk          15.0          11.0                       NonHDL 64.23     Comment: NOTE:  Non-HDL goal should be 30 mg/dL higher than patient's LDL goal (i.e. LDL goal of < 70 mg/dL, would have non-HDL goal of < 100 mg/dL)  Comprehensive metabolic panel with GFR     Status: Abnormal   Collection Time: 03/16/24 10:29 AM  Result Value Ref Range   Sodium 142 135 - 145 mEq/L   Potassium 5.4 No hemolysis seen (H) 3.5 - 5.1 mEq/L   Chloride 104 96 - 112 mEq/L   CO2  34 (H) 19 - 32 mEq/L   Glucose, Bld 87 70 - 99 mg/dL   BUN 17 6 - 23 mg/dL   Creatinine, Ser 8.96 0.40 - 1.20 mg/dL   Total Bilirubin 0.7  0.2 - 1.2 mg/dL   Alkaline Phosphatase 89 39 - 117 U/L   AST 22 0 - 37 U/L   ALT 13 0 - 35 U/L   Total Protein 6.9 6.0 - 8.3 g/dL   Albumin 4.4 3.5 - 5.2 g/dL   GFR 43.03 (L) >39.99 mL/min    Comment: Calculated using the CKD-EPI Creatinine Equation (2021)   Calcium  9.9 8.4 - 10.5 mg/dL  Microalbumin / creatinine urine ratio     Status: None   Collection Time: 03/16/24 10:29 AM  Result Value Ref Range   Microalb, Ur <0.7 mg/dL   Creatinine,U 47.9 mg/dL   Microalb Creat Ratio Unable to calculate 0.0 - 30.0 mg/g    Comment: Unable to Calculate due to Microalbumin Result of <0.7 mg/dL  Potassium     Status: None   Collection Time: 03/17/24  9:37 AM  Result Value Ref Range   Potassium 4.4 3.5 - 5.1 mEq/L  Compliance Drug Analysis, Ur     Status: None   Collection Time: 05/15/24  1:47 AM  Result Value Ref Range   Summary FINAL     Comment: ==================================================================== Compliance Drug Analysis, Ur ==================================================================== Test                             Result       Flag       Units  Drug Present and Declared for Prescription Verification   Desmethylcyclobenzaprine       PRESENT      EXPECTED    Desmethylcyclobenzaprine is an expected metabolite of    cyclobenzaprine .    Acetaminophen                   PRESENT      EXPECTED ==================================================================== Test                      Result    Flag   Units      Ref Range   Creatinine              65               mg/dL      >=79 ==================================================================== Declared Medications:  The flagging and interpretation on this report are based on the  following declared medications.  Unexpected results may arise from  inaccuracies in the  declared medications.   **Note: The testing scope of this panel incl udes these medications:   Cyclobenzaprine  (Flexeril )   **Note: The testing scope of this panel does not include small to  moderate amounts of these reported medications:   Acetaminophen  (Tylenol )   **Note: The testing scope of this panel does not include the  following reported medications:   Albuterol  (Ventolin  HFA)  Atorvastatin  (Lipitor)  Budesonide (Breztri  Aerosphere)  Cephalexin  (Keflex )  Cetirizine (Zyrtec)  Clopidogrel  (Plavix )  Epinephrine  (EpiPen )  Formoterol  (Breztri  Aerosphere)  Furosemide  (Lasix )  Glycopyrrolate  (Breztri  Aerosphere)  Losartan  (Cozaar )  Montelukast  (Singulair )  Multivitamin  Oxymetazoline  (Afrin)  Pantoprazole  (Protonix )  Terbinafine  (Lamisil )  Vitamin D  ==================================================================== For clinical consultation, please call [phone]. ====================================================================     Radiology No results found.  Assessment/Plan   Carotid stenosis, left Carotid duplex demonstrates 1 to 39% right ICA stenosis with a patent left carotid stent.  We discussed whether to stay on Plavix  or come off.  She prefers to stay on it at this time.  Will send in a new prescription for  Plavix . RTC 2 years   Essential hypertension, benign blood pressure control important in reducing the progression of atherosclerotic disease. On appropriate oral medications.     HLD (hyperlipidemia) lipid control important in reducing the progression of atherosclerotic disease. Continue statin therapy  Selinda Gu, MD  06/02/2024 9:38 AM    This note was created with Dragon medical transcription system.  Any errors from dictation are purely unintentional

## 2024-06-14 ENCOUNTER — Other Ambulatory Visit

## 2024-06-14 ENCOUNTER — Ambulatory Visit: Admitting: Podiatry

## 2024-06-19 ENCOUNTER — Ambulatory Visit (INDEPENDENT_AMBULATORY_CARE_PROVIDER_SITE_OTHER): Admitting: Podiatry

## 2024-06-19 DIAGNOSIS — M722 Plantar fascial fibromatosis: Secondary | ICD-10-CM | POA: Diagnosis not present

## 2024-06-19 DIAGNOSIS — Z79899 Other long term (current) drug therapy: Secondary | ICD-10-CM | POA: Diagnosis not present

## 2024-06-19 DIAGNOSIS — J301 Allergic rhinitis due to pollen: Secondary | ICD-10-CM | POA: Diagnosis not present

## 2024-06-19 MED ORDER — TERBINAFINE HCL 250 MG PO TABS: 250.0000 mg | ORAL_TABLET | Freq: Every day | ORAL | 0 refills | Status: AC

## 2024-06-19 NOTE — Progress Notes (Signed)
 She presents today said the left heel is doing okay no problems with it.  She states that occasionally it will hurt maybe every other day or so.  Goes on to say that she is very happy with outcome of her right great toenail and is growing out nicely.  States that she has been off of her Lamisil  for the last month.  Objective: Vital signs stable alert oriented x 3 there is no erythema edema cellulitis drainage or odor.  Hallux nail right has grown out by 80 to 85%.  There is still some margin left.  She has no reproducible pain on palpation medial calcaneal tubercle left heel.  Assessment: Plantar fasciitis left heel resolving.  Onychomycosis resolving with about 15% remaining.  Plan: At this point I am going to offer her 30 days of Lamisil  1 tablet every other day.

## 2024-06-20 DIAGNOSIS — Z961 Presence of intraocular lens: Secondary | ICD-10-CM | POA: Diagnosis not present

## 2024-06-20 DIAGNOSIS — H18413 Arcus senilis, bilateral: Secondary | ICD-10-CM | POA: Diagnosis not present

## 2024-06-20 DIAGNOSIS — H26493 Other secondary cataract, bilateral: Secondary | ICD-10-CM | POA: Diagnosis not present

## 2024-06-20 DIAGNOSIS — H35371 Puckering of macula, right eye: Secondary | ICD-10-CM | POA: Diagnosis not present

## 2024-06-20 DIAGNOSIS — H26492 Other secondary cataract, left eye: Secondary | ICD-10-CM | POA: Diagnosis not present

## 2024-06-20 DIAGNOSIS — H353131 Nonexudative age-related macular degeneration, bilateral, early dry stage: Secondary | ICD-10-CM | POA: Diagnosis not present

## 2024-06-21 ENCOUNTER — Encounter: Payer: Self-pay | Admitting: Family

## 2024-06-21 ENCOUNTER — Encounter: Payer: Self-pay | Admitting: Acute Care

## 2024-06-21 ENCOUNTER — Ambulatory Visit (INDEPENDENT_AMBULATORY_CARE_PROVIDER_SITE_OTHER): Admitting: Family

## 2024-06-21 VITALS — BP 138/86 | HR 87 | Temp 98.4°F | Ht 65.0 in | Wt 179.0 lb

## 2024-06-21 DIAGNOSIS — M8580 Other specified disorders of bone density and structure, unspecified site: Secondary | ICD-10-CM

## 2024-06-21 DIAGNOSIS — R0602 Shortness of breath: Secondary | ICD-10-CM | POA: Diagnosis not present

## 2024-06-21 DIAGNOSIS — B027 Disseminated zoster: Secondary | ICD-10-CM

## 2024-06-21 DIAGNOSIS — Z7289 Other problems related to lifestyle: Secondary | ICD-10-CM

## 2024-06-21 DIAGNOSIS — Z0001 Encounter for general adult medical examination with abnormal findings: Secondary | ICD-10-CM | POA: Insufficient documentation

## 2024-06-21 DIAGNOSIS — E875 Hyperkalemia: Secondary | ICD-10-CM | POA: Diagnosis not present

## 2024-06-21 DIAGNOSIS — Z780 Asymptomatic menopausal state: Secondary | ICD-10-CM | POA: Diagnosis not present

## 2024-06-21 DIAGNOSIS — E7800 Pure hypercholesterolemia, unspecified: Secondary | ICD-10-CM | POA: Diagnosis not present

## 2024-06-21 DIAGNOSIS — M62838 Other muscle spasm: Secondary | ICD-10-CM | POA: Diagnosis not present

## 2024-06-21 DIAGNOSIS — J441 Chronic obstructive pulmonary disease with (acute) exacerbation: Secondary | ICD-10-CM | POA: Diagnosis not present

## 2024-06-21 DIAGNOSIS — R7303 Prediabetes: Secondary | ICD-10-CM

## 2024-06-21 DIAGNOSIS — Z6829 Body mass index (BMI) 29.0-29.9, adult: Secondary | ICD-10-CM

## 2024-06-21 LAB — COMPREHENSIVE METABOLIC PANEL WITH GFR
ALT: 13 U/L (ref 0–35)
AST: 20 U/L (ref 0–37)
Albumin: 4.2 g/dL (ref 3.5–5.2)
Alkaline Phosphatase: 83 U/L (ref 39–117)
BUN: 13 mg/dL (ref 6–23)
CO2: 32 meq/L (ref 19–32)
Calcium: 9.4 mg/dL (ref 8.4–10.5)
Chloride: 103 meq/L (ref 96–112)
Creatinine, Ser: 0.95 mg/dL (ref 0.40–1.20)
GFR: 62.65 mL/min (ref >60.00)
Glucose, Bld: 84 mg/dL (ref 70–99)
Potassium: 4.6 meq/L (ref 3.5–5.1)
Sodium: 143 meq/L (ref 135–145)
Total Bilirubin: 0.7 mg/dL (ref 0.2–1.2)
Total Protein: 6.8 g/dL (ref 6.0–8.3)

## 2024-06-21 LAB — CBC WITH DIFFERENTIAL/PLATELET
Basophils Absolute: 0 K/uL (ref 0.0–0.1)
Basophils Relative: 0.5 % (ref 0.0–3.0)
Eosinophils Absolute: 0.1 K/uL (ref 0.0–0.7)
Eosinophils Relative: 2.9 % (ref 0.0–5.0)
HCT: 42 % (ref 36.0–46.0)
Hemoglobin: 13.6 g/dL (ref 12.0–15.0)
Lymphocytes Relative: 19.9 % (ref 12.0–46.0)
Lymphs Abs: 0.9 K/uL (ref 0.7–4.0)
MCHC: 32.3 g/dL (ref 30.0–36.0)
MCV: 97.4 fl (ref 78.0–100.0)
Monocytes Absolute: 0.3 K/uL (ref 0.1–1.0)
Monocytes Relative: 7.6 % (ref 3.0–12.0)
Neutro Abs: 3.2 K/uL (ref 1.4–7.7)
Neutrophils Relative %: 69.1 % (ref 43.0–77.0)
Platelets: 237 K/uL (ref 150.0–400.0)
RBC: 4.31 Mil/uL (ref 3.87–5.11)
RDW: 13.5 % (ref 11.5–15.5)
WBC: 4.6 K/uL (ref 4.0–10.5)

## 2024-06-21 LAB — TSH: TSH: 2.09 u[IU]/mL (ref 0.35–5.50)

## 2024-06-21 LAB — MAGNESIUM: Magnesium: 1.8 mg/dL (ref 1.5–2.5)

## 2024-06-21 LAB — BRAIN NATRIURETIC PEPTIDE: Pro B Natriuretic peptide (BNP): 260 pg/mL — ABNORMAL HIGH (ref 0.0–100.0)

## 2024-06-21 MED ORDER — VALACYCLOVIR HCL 1 G PO TABS: 1000.0000 mg | ORAL_TABLET | Freq: Three times a day (TID) | ORAL | 0 refills | Status: AC

## 2024-06-21 MED ORDER — PREDNISONE 20 MG PO TABS
ORAL_TABLET | ORAL | 0 refills | Status: DC
Start: 2024-06-21 — End: 2024-07-11

## 2024-06-21 NOTE — Patient Instructions (Signed)
  Change to allegra from zyrtec  Prednisone  to help with breathing Call pulmonary to schedule sooner appt let them know what is going on.  Consider cardiology if ongoing shortness of breath

## 2024-06-21 NOTE — Progress Notes (Signed)
 Subjective:  Patient ID: Heather Beasley, female    DOB: December 02, 1955  Age: 68 y.o. MRN: [id number]  Patient Care Team: Corwin Antu, FNP as PCP - General (Family Medicine) Verlin Lonni BIRCH, MD as PCP - Cardiology (Cardiology) Lenn Aran, MD as Referring Physician (Radiation Oncology) Herminio Miu, MD (Otolaryngology) Clois Fret, MD as Consulting Physician (Neurosurgery) Delores Orvin BRAVO, NP as Nurse Practitioner (Vascular Surgery) [facility], [facility] [place] Lavonia Lye, MD as Consulting Physician (Ophthalmology) Tamea Dedra CROME, MD as Consulting Physician (Pulmonary Disease)   CC:  Chief Complaint  Patient presents with   Annual Exam    HPI Heather Beasley is a 68 y.o. female who presents today for an annual physical exam. She reports consuming a general diet. The patient does not participate in regular exercise at present.  Limited by breathing exacerbation She generally feels well. She reports sleeping well. She does have additional problems to discuss today.   Vision:Within last year Dental:Receives regular dental care  Lung Cancer Screening with low-dose Chest CT: due for 6 month repeat per pulmonary   Mammogram: 04/14/23 scheduled already for annual  Last pap: > 5 y/o  Colonoscopy:05/02/20 next due 2031 Bone density scan: 2023 will order  Pt is with acute concerns.   Discussed the use of AI scribe software for clinical note transcription with the patient, who gave verbal consent to proceed.  History of Present Illness Heather Beasley is a 68 year old female with COPD, asthma, and emphysema who presents with recent asthma attacks and shortness of breath.  Over the past month to month and a half, she has experienced asthma attacks with shortness of breath occurring at rest and during minimal activity. These episodes are not triggered by typical environmental factors like smoke or perfume. She uses Breztri  twice daily and has increased her  albuterol  inhaler use to twice daily, which she previously used as needed. Despite this, she continues to experience shortness of breath and describes a sensation of tightness around her throat during these episodes.  She has a history of COPD, asthma, and emphysema, and is currently on Breztri  and Singulair  at night. She also takes Zyrtec daily for allergies. She experiences wheezing, particularly later in the day, and reports palpitations. She has a history of carotid stenosis with a left carotid stent placed four years ago, and recent carotid duplex showed 1-39% right ICA stenosis with a widely patent left carotid stent. No dizziness, lightheadedness, or stroke-like symptoms are present.  She mentions a recent rash on her left side, which is sore to touch but not tingly or burning. She has not had shingles before. She also reports experiencing leg cramps at night, which she manages with mustard. No recent weight changes or significant swelling, but she notes diarrhea attributed to IBS.  She has a history of smoking but has switched to vaping with zero nicotine . She denies current smoking. She has a history of using Lasix  as needed for swelling but has not used it recently due to low blood pressure when combined with her blood pressure medication. She reports good sleep but experiences shortness of breath when lying flat, which has been ongoing for over two years. A sleep study last year ruled out sleep apnea.     Advanced Directives Patient does not have advanced directives    DEPRESSION SCREENING    06/21/2024    9:32 AM 05/30/2024   10:49 AM 03/16/2024    9:59 AM 03/15/2024    8:32 AM  02/14/2024   11:11 AM 01/24/2024   10:30 AM 12/14/2023    9:04 AM  PHQ 2/9 Scores  PHQ - 2 Score 0 0 1 0 1 1 1   PHQ- 9 Score 1  3         ROS: Negative unless specifically indicated above in HPI.    Current Outpatient Medications:    acetaminophen  (TYLENOL ) 650 MG CR tablet, Take 650 mg by mouth every 8  (eight) hours as needed for pain., Disp: , Rfl:    albuterol  (VENTOLIN  HFA) 108 (90 Base) MCG/ACT inhaler, Inhale 1-2 puffs into the lungs every 6 (six) hours as needed (for cough)., Disp: 18 g, Rfl: 2   atorvastatin  (LIPITOR) 20 MG tablet, TAKE ONE TABLET BY MOUTH ONCE A DAY, Disp: 90 tablet, Rfl: 3   Budeson-Glycopyrrol-Formoterol  (BREZTRI  AEROSPHERE) 160-9-4.8 MCG/ACT AERO, Inhale 2 puffs into the lungs in the morning and at bedtime., Disp: 11.8 g, Rfl: 0   cetirizine (ZYRTEC) 10 MG tablet, Take 10 mg by mouth daily. Allergies., Disp: , Rfl:    Cholecalciferol (VITAMIN D ) 50 MCG (2000 UT) CAPS, Take 2,000 Units by mouth., Disp: , Rfl:    clopidogrel  (PLAVIX ) 75 MG tablet, Take 1 tablet (75 mg total) by mouth daily., Disp: 90 tablet, Rfl: 3   cyclobenzaprine  (FLEXERIL ) 10 MG tablet, TAKE ONE TABLET (10 MG TOTAL) BY MOUTH THREE TIMES DAILY AS NEEDED FOR MUSCLE SPASMS., Disp: 90 tablet, Rfl: 0   EPINEPHrine  0.3 mg/0.3 mL IJ SOAJ injection, Inject 1 mg into the skin as needed., Disp: , Rfl:    furosemide  (LASIX ) 20 MG tablet, Take 1 tablet (20 mg total) by mouth as needed for fluid., Disp: 30 tablet, Rfl: 0   losartan  (COZAAR ) 100 MG tablet, Take 1 tablet (100 mg total) by mouth daily., Disp: 90 tablet, Rfl: 0   montelukast  (SINGULAIR ) 10 MG tablet, TAKE ONE TABLET BY MOUTH AT BEDTIME, Disp: 90 tablet, Rfl: 3   Multiple Vitamins-Minerals (CENTRUM ADULTS PO), Take 1 tablet by mouth., Disp: , Rfl:    oxymetazoline  (AFRIN) 0.05 % nasal spray, Place 1 spray into both nostrils 2 (two) times daily as needed for congestion. Use for nose bleeds, Disp: , Rfl:    pantoprazole  (PROTONIX ) 40 MG tablet, Take 1 tablet (40 mg total) by mouth daily., Disp: 90 tablet, Rfl: 3   predniSONE  (DELTASONE ) 20 MG tablet, Take two tablets once daily for five days, Disp: 10 tablet, Rfl: 0   terbinafine  (LAMISIL ) 250 MG tablet, Take 1 tablet (250 mg total) by mouth daily., Disp: 30 tablet, Rfl: 0   valACYclovir  (VALTREX ) 1000  MG tablet, Take 1 tablet (1,000 mg total) by mouth 3 (three) times daily for 7 days., Disp: 21 tablet, Rfl: 0   buprenorphine  (BUTRANS ) 5 MCG/HR PTWK, Place 1 patch onto the skin once a week for 28 days. (Patient not taking: Reported on 06/21/2024), Disp: 4 patch, Rfl: 0   [START ON 06/27/2024] buprenorphine  (BUTRANS ) 7.5 MCG/HR, Place 1 patch onto the skin once a week for 28 days. (Patient not taking: Reported on 06/21/2024), Disp: 4 patch, Rfl: 0    Objective:    BP 138/86 (BP Location: Left Arm, Patient Position: Sitting, Cuff Size: Normal)   Pulse 87   Temp 98.4 F (36.9 C) (Temporal)   Ht 5' 5 (1.651 m)   Wt 179 lb (81.2 kg)   SpO2 98%   BMI 29.79 kg/m   BP Readings from Last 3 Encounters:  06/21/24 138/86  06/02/24 134/83  05/30/24 (!) 149/77      Physical Exam Vitals reviewed.  Constitutional:      General: She is not in acute distress.    Appearance: Normal appearance. She is normal weight. She is not ill-appearing.  HENT:     Head: Normocephalic.     Right Ear: Tympanic membrane normal.     Left Ear: Tympanic membrane normal.     Nose: Nose normal.     Mouth/Throat:     Mouth: Mucous membranes are moist.  Eyes:     Extraocular Movements: Extraocular movements intact.     Pupils: Pupils are equal, round, and reactive to light.  Cardiovascular:     Rate and Rhythm: Normal rate and regular rhythm.  Pulmonary:     Effort: Pulmonary effort is normal.     Breath sounds: Normal breath sounds.  Abdominal:     General: Abdomen is flat. Bowel sounds are normal.     Palpations: Abdomen is soft.     Tenderness: There is no guarding or rebound.  Musculoskeletal:        General: Normal range of motion.     Cervical back: Normal range of motion.  Skin:    General: Skin is warm.     Capillary Refill: Capillary refill takes less than 2 seconds.     Comments: Small papular lesions localized to left lateral aspect of trunk with slightly scaly texture and some erythema    Neurological:     General: No focal deficit present.     Mental Status: She is alert.  Psychiatric:        Mood and Affect: Mood normal.        Behavior: Behavior normal.        Thought Content: Thought content normal.        Judgment: Judgment normal.       Results DIAGNOSTIC Carotid duplex ultrasound: 1-39% right internal carotid artery (ICA) stenosis, widely patent left carotid stent (06/02/2024)      Assessment & Plan:                                                                                                                                Assessment & Plan Chronic obstructive pulmonary disease with acute exacerbation and asthma Recent increase in asthma attacks over the past month and a half, with episodes occurring at rest and during minimal activity. Increased use of albuterol  inhaler to twice daily indicates worsening control. No chest pain, but reports shortness of breath and palpitations. Wheezing noted on examination. Differential includes exacerbation of COPD and asthma, possibly influenced by allergies. Concerns about the use of Butrans  patches due to potential respiratory depression were discussed, and it was advised to avoid them until asthma is better controlled. - Continue Breztri  inhaler twice daily. - Switch from Zyrtec to Allegra for allergy management. - Prescribe prednisone  to help with breathing. - Call pulmonologist to schedule an earlier appointment. - Consider  cardiology evaluation if shortness of breath persists. - Encourage cessation of vaping, especially glycerin-based products.  Suspected herpes zoster (shingles) New rash on the left side, sore to touch, with small red dots. No burning or tingling, but itching present. Differential includes shingles, given the location and presentation. Early treatment with antivirals is advised to reduce the duration if it is shingles. - Prescribe Valtrex  (valacyclovir ) one pill three times a day for seven  days. - Apply calamine lotion to reduce itching. - Monitor for progression to blisters and report if no improvement.  Essential hypertension Blood pressure readings indicate some elevation, with systolic pressure slightly high. Reports low blood pressure when taking Lasix  with antihypertensive medication. Advised to adjust Lasix  dosage based on swelling and blood pressure readings. - Consider taking half a tablet of Lasix  if swelling is significant. - Monitor blood pressure and adjust medications as needed.  Irritable bowel syndrome with diarrhea Reports chronic diarrhea, attributed to IBS.  Muscle cramps Experiencing nighttime cramps in the foot and leg. Possible magnesium  deficiency considered. Advised to ensure adequate hydration and consider magnesium  supplementation. - Check magnesium  levels. - Start magnesium  supplementation at 300 mg daily. - Ensure adequate hydration with 80 ounces of water  daily.  Discussed recent medical history and current health status.  Hyperlipidema Ordered lipid panel, pending results. Work on low cholesterol diet and exercise as tolerated  Vaping Currently vaping with zero nicotine  but using glycerin-based products. Discussed potential respiratory risks. Consideration of switching to a recommended alternative vape without glycerin, such as Fume, was discussed. - Encourage cessation of vaping. - Consider switching to a recommended alternative vape without glycerin, such as Fume.  Encounter general exam adult with abn findings Patient Counseling(The following topics were reviewed):  Preventative care handout given to pt  Health maintenance and immunizations reviewed. Please refer to Health maintenance section. Pt advised on safe sex, wearing seatbelts in car, and proper nutrition labwork ordered today for annual Dental health: Discussed importance of regular tooth brushing, flossing, and dental visits.     Recording duration: 27  minutes              Follow-up: Return in about 1 month (around 07/22/2024) for f/u sob .   Ginger Patrick, FNP

## 2024-06-22 ENCOUNTER — Ambulatory Visit: Payer: Self-pay | Admitting: Family

## 2024-06-22 DIAGNOSIS — R7989 Other specified abnormal findings of blood chemistry: Secondary | ICD-10-CM

## 2024-06-22 DIAGNOSIS — R0602 Shortness of breath: Secondary | ICD-10-CM

## 2024-06-23 ENCOUNTER — Telehealth: Payer: Self-pay

## 2024-06-23 NOTE — Telephone Encounter (Signed)
 Copied from CRM [id number]. Topic: Clinical - Medical Advice >> Jun 22, 2024  4:32 PM [place] E wrote: Reason for CRM: Patient is wanting to get a message to Manuelita, patient spoke with her Cardiologist and they will not be able to get her in until mid-September. Patient just wanted to make Manuelita aware of this, callback number for patient is [phone].

## 2024-06-23 NOTE — Telephone Encounter (Signed)
 NOTED She has appt with me 9/4 so we will re evaluate then.

## 2024-06-27 DIAGNOSIS — H26492 Other secondary cataract, left eye: Secondary | ICD-10-CM | POA: Diagnosis not present

## 2024-06-28 ENCOUNTER — Ambulatory Visit
Admission: RE | Admit: 2024-06-28 | Discharge: 2024-06-28 | Disposition: A | Discharge status: Home / Self Care | Source: Ambulatory Visit | Attending: Primary Care | Admitting: Primary Care

## 2024-06-28 DIAGNOSIS — Z1231 Encounter for screening mammogram for malignant neoplasm of breast: Secondary | ICD-10-CM | POA: Insufficient documentation

## 2024-06-29 ENCOUNTER — Ambulatory Visit: Admitting: Family

## 2024-06-29 ENCOUNTER — Ambulatory Visit: Payer: Self-pay | Admitting: Family

## 2024-06-29 ENCOUNTER — Encounter: Payer: Self-pay | Admitting: Family

## 2024-06-29 ENCOUNTER — Ambulatory Visit (INDEPENDENT_AMBULATORY_CARE_PROVIDER_SITE_OTHER): Admitting: Family

## 2024-06-29 ENCOUNTER — Ambulatory Visit (INDEPENDENT_AMBULATORY_CARE_PROVIDER_SITE_OTHER)
Admission: RE | Admit: 2024-06-29 | Discharge: 2024-06-29 | Disposition: A | Discharge status: Home / Self Care | Source: Ambulatory Visit | Attending: Family | Admitting: Family

## 2024-06-29 VITALS — BP 134/90 | HR 103 | Temp 98.0°F | Ht 65.0 in | Wt 178.0 lb

## 2024-06-29 DIAGNOSIS — Z981 Arthrodesis status: Secondary | ICD-10-CM | POA: Diagnosis not present

## 2024-06-29 DIAGNOSIS — J4489 Other specified chronic obstructive pulmonary disease: Secondary | ICD-10-CM

## 2024-06-29 DIAGNOSIS — R7989 Other specified abnormal findings of blood chemistry: Secondary | ICD-10-CM | POA: Diagnosis not present

## 2024-06-29 LAB — CBC WITH DIFFERENTIAL/PLATELET
Basophils Absolute: 0 K/uL (ref 0.0–0.1)
Basophils Relative: 0.4 % (ref 0.0–3.0)
Eosinophils Absolute: 0.2 K/uL (ref 0.0–0.7)
Eosinophils Relative: 4.6 % (ref 0.0–5.0)
HCT: 40.9 % (ref 36.0–46.0)
Hemoglobin: 13.4 g/dL (ref 12.0–15.0)
Lymphocytes Relative: 19.1 % (ref 12.0–46.0)
Lymphs Abs: 0.8 K/uL (ref 0.7–4.0)
MCHC: 32.7 g/dL (ref 30.0–36.0)
MCV: 97.9 fl (ref 78.0–100.0)
Monocytes Absolute: 0.3 K/uL (ref 0.1–1.0)
Monocytes Relative: 7 % (ref 3.0–12.0)
Neutro Abs: 3 K/uL (ref 1.4–7.7)
Neutrophils Relative %: 68.9 % (ref 43.0–77.0)
Platelets: 231 K/uL (ref 150.0–400.0)
RBC: 4.18 Mil/uL (ref 3.87–5.11)
RDW: 13.2 % (ref 11.5–15.5)
WBC: 4.4 K/uL (ref 4.0–10.5)

## 2024-06-29 LAB — BRAIN NATRIURETIC PEPTIDE: Pro B Natriuretic peptide (BNP): 260 pg/mL — ABNORMAL HIGH (ref 0.0–100.0)

## 2024-06-29 NOTE — Progress Notes (Signed)
 Acute Office Visit  Subjective:     Patient ID: Heather Beasley, female    DOB: [birth year], 68 y.o.   MRN: [id number]  Chief Complaint  Patient presents with   Follow-up    1 week follow up    HPI Patient is in today for a follow-up from 1 week ago where she was found to have an elevated BNP. She is supposed to take Lasix  daily but admits to not taking it the last 3 days because she has had appointments and it make her urinate a lot. She also has not had her CXR performed. She denies and chest pain or new SOB. No swelling. Has a long history of COPD.   Review of Systems  Constitutional: Negative.   HENT: Negative.    Respiratory:  Positive for shortness of breath.   Cardiovascular: Negative.  Negative for palpitations.  Musculoskeletal: Negative.   Skin: Negative.   Neurological: Negative.   Endo/Heme/Allergies: Negative.   Psychiatric/Behavioral: Negative.    All other systems reviewed and are negative.   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; T1 N0 M0; s/p adjuvant XRT; did not require systemic chemotherapy   Cervical spondylosis    Colitis    Colon polyps    COPD (chronic obstructive pulmonary disease) (HCC)    Coronary artery disease    a.) cCTA 01/26/2022: Ca score 80.3 (82nd percentile for age/sex match control)   DDD (degenerative disc disease), cervical    a.) ACDF C3-C6   Depression    Diastolic dysfunction    a.) TTE 06/30/2013: EF 55-6%, mild LAE, mild MR, G1DD; b.) TTE 01/21/2022: EF 70-75%, mod LVH, mild MR, G1DD    Diverticulosis    GERD (gastroesophageal reflux disease)    Glaucoma    she reports that she does not have glacoma, she has thick corneas   Headaches, cluster    Heart murmur    HTN (hypertension)    Hyperlipidemia    IBS (irritable bowel syndrome)    Kidney stones    Long term current use of antithrombotics/antiplatelets    a.) clopidogrel    Lumbar adjacent segment disease with spondylolisthesis    Melanoma (HCC) 1995   above the L knee    Neuropathy    Neuropathy of left lower extremity    Nose colonized with MRSA 02/16/2023   a.) surgical PCR (+) 02/16/2023 prior to L3-4 LATERAL LUMBAR INTERBODY FUSION; L3-S1 POSTERIOR SPINAL FUSION   PFO (patent foramen ovale)    a.) cCTA 01/26/2022 --> PFO with L to R contrast flow   Pneumonia    PONV (postoperative nausea and vomiting)    2012 - during lumbar fusion she developed spinal fluid leak, and had to have 3 units on Blood, BP dropped, and in ICU,   Tobacco abuse     Social History   Socioeconomic History   Marital status: Married    Spouse name: Nancyann   Number of children: 2   Years of education: Not on file   Highest education level: Not on file  Occupational History   Occupation: Conservation officer, nature   Occupation: disabled  Tobacco Use   Smoking status: Every Day    Types: E-cigarettes   Smokeless tobacco: Never   Tobacco comments:    Using vape with no nicotine    Vaping Use   Vaping status: Every Day   Substances: Flavoring  Substance and Sexual Activity   Alcohol use: Not Currently   Drug use: No   Sexual activity: Not Currently    Partners: Male  Other Topics Concern   Not on file  Social History Narrative   Lives in [place] with husband. Dog in home. Work - disabled for neck and back pain.   Social Drivers of Corporate investment banker Strain: Low Risk  (03/15/2024)   Overall Financial Resource Strain (CARDIA)    Difficulty of Paying Living Expenses: Not hard at all  Food Insecurity: No Food Insecurity  (03/15/2024)   Hunger Vital Sign    Worried About Running Out of Food in the Last Year: Never true    Ran Out of Food in the Last Year: Never true  Transportation Needs: No Transportation Needs (03/15/2024)   PRAPARE - Administrator, Civil Service (Medical): No    Lack of Transportation (Non-Medical): No  Physical Activity: Inactive (03/15/2024)   Exercise Vital Sign    Days of Exercise per Week: 0 days    Minutes of Exercise per Session: 0 min  Stress: No Stress Concern Present (03/15/2024)   [employer] of Occupational Health - Occupational Stress Questionnaire    Feeling of Stress : Not at all  Social Connections: Moderately Isolated (03/15/2024)   Social Connection and Isolation Panel    Frequency of Communication with Friends and Family: More than three times a week    Frequency of Social Gatherings with Friends and Family: Twice a week    Attends Religious Services: Never    Database administrator or Organizations: No    Attends Banker Meetings: Never    Marital Status: Married  Catering manager Violence: Not At Risk (03/15/2024)   Humiliation, Afraid, Rape, and Kick questionnaire    Fear of Current or Ex-Partner: No    Emotionally Abused: No    Physically Abused: No    Sexually Abused: No    Past Surgical History:  Procedure Laterality Date   ABDOMINAL HYSTERECTOMY  1998   ANTERIOR LUMBAR FUSION N/A 03/01/2023   Procedure: L3-4 LATERAL LUMBAR INTERBODY FUSION;  Surgeon: Clois Fret, MD;  Location: [facility];  Service: Neurosurgery;  Laterality: N/A;   APPENDECTOMY  1998   APPLICATION OF INTRAOPERATIVE CT SCAN N/A 03/01/2023   Procedure: APPLICATION OF INTRAOPERATIVE CT SCAN;  Surgeon: Clois Fret, MD;  Location: [facility];  Service: Neurosurgery;  Laterality: N/A;   BACK SURGERY     CAROTID PTA/STENT INTERVENTION Left 06/27/2020   Procedure: CAROTID PTA/STENT INTERVENTION;  Surgeon: Marea Selinda RAMAN, MD;  Location: [facility];   Service: Cardiovascular;  Laterality: Left;   CATARACT EXTRACTION     CERVICAL FUSION  2010   C3-C6   CHOLECYSTECTOMY  08/2011   Procedure: Cholecystectomy; Location: [facility]; Surgeon: Unknown Sharps, MD   COLONOSCOPY N/A    Procedure: COLONOSCOPY; Location: [facility]; Surgeon: Lamar Holmes, MD   CYSTOSCOPY N/A    Procedure: CYSTOSCOPY; Location: [facility]; Surgeon: Glendia Barba, MD   DILATION AND CURETTAGE OF UTERUS     ESOPHAGOGASTRODUODENOSCOPY  2012   Procedure: ESOPHAGOGASTRODUODENOSCOPY; Location: [facility]; Surgeon: Lamar Holmes, MD   FRACTURE  SURGERY  2001   plate in right wrist and arm   IMAGE GUIDED SINUS SURGERY N/A 06/25/2015   Procedure: IMAGE GUIDED SINUS SURGERY;  Surgeon: Chinita Hasten, MD;  Location: [facility];  Service: ENT;  Laterality: N/A;   LUMBAR SPINE SURGERY  07/2004   OOPHORECTOMY     POLYPECTOMY N/A 06/25/2015   Procedure: POLYPECTOMY NASAL;  Surgeon: Chinita Hasten, MD;  Location: [facility];  Service: ENT;  Laterality: N/A;   TONSILLECTOMY  1968   VAGINAL DELIVERY     x2    Family History  Problem Relation Age of Onset   Hypertension Mother    Hyperlipidemia Mother    Diabetes Mother    Colon cancer Father    Pancreatic cancer Father    Cancer Father        Colon & Pancreatic   Breast cancer Paternal Grandmother 40   Diabetes Paternal Grandmother    Coronary artery disease Paternal Grandmother    Heart failure Paternal Grandmother    Cancer Paternal Grandmother        breast   Stroke Brother     Allergies  Allergen Reactions   Albumin (Human) Rash   Amoxicillin-Pot Clavulanate Hives   Gabapentin Shortness Of Breath   Latex Dermatitis and Rash   Betadine [Povidone Iodine ] Dermatitis, Rash and Other (See Comments)    Skin burning Topical betadine and iodine  have this reaction with patient.  IV contrast is NOT a problem.  jkl   Sucralfate     Other reaction(s): Other (See Comments) Abdominal Pain   Amoxicillin     REACTION: rash   Chantix  [Varenicline Tartrate]     Heart racing   Erythromycin     REACTION: rash   Sucralfate Nausea And Vomiting    Heart racing Lightheaded    Tegaderm Ag Mesh [Silver] Hives    Mild     Current Outpatient Medications on File Prior to Visit  Medication Sig Dispense Refill   acetaminophen  (TYLENOL ) 650 MG CR tablet Take 650 mg by mouth every 8 (eight) hours as needed for pain.     albuterol  (VENTOLIN  HFA) 108 (90 Base) MCG/ACT inhaler Inhale 1-2 puffs into the lungs every 6 (six) hours as needed (for cough). 18 g 2   atorvastatin  (LIPITOR) 20 MG tablet TAKE ONE TABLET BY MOUTH ONCE A DAY 90 tablet 3   Budeson-Glycopyrrol-Formoterol  (BREZTRI  AEROSPHERE) 160-9-4.8 MCG/ACT AERO Inhale 2 puffs into the lungs in the morning and at bedtime. 11.8 g 0   cetirizine (ZYRTEC) 10 MG tablet Take 10 mg by mouth daily. Allergies.     Cholecalciferol (VITAMIN D ) 50 MCG (2000 UT) CAPS Take 2,000 Units by mouth.     clopidogrel  (PLAVIX ) 75 MG tablet Take 1 tablet (75 mg total) by mouth daily. 90 tablet 3   cyclobenzaprine  (FLEXERIL ) 10 MG tablet TAKE ONE TABLET (10 MG TOTAL) BY MOUTH THREE TIMES DAILY AS NEEDED FOR MUSCLE SPASMS. 90 tablet 0   EPINEPHrine  0.3 mg/0.3 mL IJ SOAJ injection Inject 1 mg into the skin as needed.     furosemide  (LASIX ) 20 MG tablet Take 1 tablet (20 mg total) by mouth as needed for fluid. 30 tablet 0   losartan  (COZAAR ) 100 MG tablet Take 1 tablet (100 mg total) by mouth daily. 90 tablet 0   montelukast  (SINGULAIR ) 10 MG tablet TAKE ONE TABLET BY MOUTH AT BEDTIME 90 tablet 3   Multiple Vitamins-Minerals (CENTRUM ADULTS PO) Take 1 tablet by mouth.  oxymetazoline  (AFRIN) 0.05 % nasal spray Place 1 spray into both nostrils 2 (two) times daily as needed for congestion. Use for nose bleeds     pantoprazole  (PROTONIX ) 40 MG tablet Take 1 tablet (40 mg total) by mouth daily. 90 tablet 3   predniSONE  (DELTASONE ) 20 MG tablet Take two tablets once daily for five days 10 tablet 0    terbinafine  (LAMISIL ) 250 MG tablet Take 1 tablet (250 mg total) by mouth daily. 30 tablet 0   No current facility-administered medications on file prior to visit.    BP (!) 144/96 (BP Location: Left Arm, Patient Position: Sitting, Cuff Size: Large)   Pulse (!) 103   Temp 98 F (36.7 C) (Temporal)   Ht 5' 5 (1.651 m)   Wt 178 lb (80.7 kg)   SpO2 98%   BMI 29.62 kg/m chart     Objective:    BP (!) 144/96 (BP Location: Left Arm, Patient Position: Sitting, Cuff Size: Large)   Pulse (!) 103   Temp 98 F (36.7 C) (Temporal)   Ht 5' 5 (1.651 m)   Wt 178 lb (80.7 kg)   SpO2 98%   BMI 29.62 kg/m    Physical Exam Vitals and nursing note reviewed.  Constitutional:      Appearance: Normal appearance. She is normal weight.  HENT:     Right Ear: Tympanic membrane, ear canal and external ear normal.     Left Ear: Tympanic membrane, ear canal and external ear normal.  Cardiovascular:     Rate and Rhythm: Normal rate and regular rhythm.     Pulses: Normal pulses.     Heart sounds: Normal heart sounds.  Pulmonary:     Effort: Pulmonary effort is normal.     Breath sounds: Normal breath sounds.  Musculoskeletal:        General: Normal range of motion.  Skin:    General: Skin is warm and dry.  Neurological:     General: No focal deficit present.     Mental Status: She is alert and oriented to person, place, and time. Mental status is at baseline.  Psychiatric:        Mood and Affect: Mood normal.        Behavior: Behavior normal.     No results found for any visits on 06/29/24.      Assessment & Plan:   Problem List Items Addressed This Visit     Elevated brain natriuretic peptide (BNP) level - Primary   Relevant Orders   DG Chest 2 View   Brain natriuretic peptide   CBC with Differential   Other Visit Diagnoses       Chronic bronchitis with COPD (chronic obstructive pulmonary disease) (HCC)          Resume Lasix  as soon as possible. We will obtain chest  xray and labs today. Call the office if symptoms worsen or persist. Recheck as scheduled and sooner as needed.    No orders of the defined types were placed in this encounter.   No follow-ups on file.  Nickalos Petersen B Madilynne Mullan, FNP

## 2024-07-03 ENCOUNTER — Ambulatory Visit: Payer: Self-pay | Admitting: Primary Care

## 2024-07-03 DIAGNOSIS — J301 Allergic rhinitis due to pollen: Secondary | ICD-10-CM | POA: Diagnosis not present

## 2024-07-07 DIAGNOSIS — H26491 Other secondary cataract, right eye: Secondary | ICD-10-CM | POA: Diagnosis not present

## 2024-07-09 NOTE — Progress Notes (Signed)
 Cardiology Office Note   Date:  07/10/2024  ID:  Heather, Beasley [birth year], MRN [id number] PCP: Heather Antu, FNP  [facility] Providers Cardiologist:  Heather Cash, MD   History of Present Illness Heather Beasley is a 68 y.o. female with a past medical history of carotid artery disease status post carotid artery stenting, arthritis, depression, GERD, hyperlipidemia, HTN, IBS and prior tobacco abuse here for cardiac follow-up.  Was seen originally in consultation for shortness of breath and palpitations back in March 2023.  Was seen in the ED 12/31/2021 with complaint of dyspnea.  Chest CTA with no PE but coronary artery calcifications noted.  Known carotid artery disease and felt to be due to prior radiation therapy and had undergone carotid artery stenting at [facility] in 2021.  Echocardiogram 01/21/2022 with LVEF 70 to 75%.  Moderate asymmetric septal hypertrophy.  Mild mitral regurgitation.  Coronary CTA 02/22/2022 with mild CAD.  Sleep study in July 2023 without evidence of sleep apnea.  She stopped her amlodipine  and cut her losartan  in half due to soft blood pressure.  Stopped smoking March 2024.  Noted improvement in her dyspnea after starting Lasix  and after stopping smoking.  When she was last seen in the office October 2024 she denied chest pain, dyspnea, palpitations, lower extremity edema, orthopnea, PND, dizziness, syncope/near syncope.  Today, she presents with a history of COPD, asthma, and emphysema with concerns about fluid retention and shortness of breath.  She takes Lasix  daily for fluid retention, although it was initially prescribed 'as needed.' A recent chest X-ray showed no fluid around the heart or lungs, but her BNP is elevated at 260. She questions the necessity of daily Lasix .  She experiences shortness of breath, particularly after exertion, such as walking from the parking deck to the office. She attributes this primarily to her COPD, asthma, and  emphysema. No chest pain is present.  Her medical history includes a mild mitral valve leak noted two years ago and coronary artery disease identified on a chest CT scan. She has a history of a 97% blocked carotid artery, which was stented in 2019 due to scar tissue from radiation treatment for nasal polyp cancer.  She quit smoking and switched to vaping without nicotine  after her last cigarette on February 28 of the previous year, in preparation for back surgery.  Reports no chest pain, pressure, or tightness. No edema, orthopnea, PND. Reports no palpitations.   Discussed the use of AI scribe software for clinical note transcription with the patient, who gave verbal consent to proceed.   ROS: pertinent ROS in HPI  Studies Reviewed      Echo 01/21/22:  1. Left ventricular ejection fraction, by estimation, is 70 to 75%. The  left ventricle has hyperdynamic function. The left ventricle has no  regional wall motion abnormalities. There is moderate asymmetric left  ventricular hypertrophy of the septal  segment. Left ventricular diastolic parameters are consistent with Grade I  diastolic dysfunction (impaired relaxation).   2. Right ventricular systolic function is normal. The right ventricular  size is normal. Tricuspid regurgitation signal is inadequate for assessing  PA pressure.   3. The mitral valve is normal in structure. Mild mitral valve  regurgitation. No evidence of mitral stenosis.   4. The aortic valve is normal in structure. Aortic valve regurgitation is  not visualized. No aortic stenosis is present.   5. The inferior vena cava is normal in size with greater than 50%  respiratory variability, suggesting  right atrial pressure of 3 mmHg.      Physical Exam VS:  BP 136/82   Pulse 81   Ht 5' 5 (1.651 m)   Wt 176 lb (79.8 kg)   SpO2 93%   BMI 29.29 kg/m        Wt Readings from Last 3 Encounters:  07/10/24 176 lb (79.8 kg)  06/29/24 178 lb (80.7 kg)  06/21/24 179 lb  (81.2 kg)    GEN: Well nourished, well developed in no acute distress NECK: No JVD; No carotid bruits CARDIAC: RRR, no murmurs, rubs, gallops RESPIRATORY:  Clear to auscultation without rales, wheezing or rhonchi  ABDOMEN: Soft, non-tender, non-distended EXTREMITIES:  No edema; No deformity   ASSESSMENT AND PLAN  Heart failure with preserved ejection fraction (diastolic dysfunction) BNP elevated at 260 (9/4), no effusion or edema, weight decreased by two pounds. Echocardiogram showed normal systolic function, impaired diastolic dysfunction Shortness of breath likely due to COPD, asthma, and emphysema. - Order echocardiogram to assess diastolic dysfunction. - Revert to as-needed use of Lasix . - Monitor daily weight; if weight increases by 2 pounds overnight or 5 pounds in a week, resume daily Lasix . - Order BNP test today to reassess fluid levels.  Nonrheumatic mitral valve insufficiency Previous echocardiogram indicated mild mitral valve leak. - Reassess mitral valve function with echocardiogram.  Atherosclerotic heart disease of native coronary artery without angina Coronary artery disease noted on chest CT scan. No current angina.   Chronic obstructive pulmonary disease with asthma and emphysema Shortness of breath likely due to COPD, asthma, and emphysema. Vaping without nicotine  since February last year.  Status post carotid artery stent placement Carotid artery stent placed for 97% blockage due to radiation-induced scar tissue. No further radiation anticipated, reducing recurrence risk. -monitored annually by vascular out in [place]   Nasal polyp cancer, post-radiation Stage 3 nasal polyp cancer treated with 48 radiation sessions. Complications include carotid artery blockage, cataracts, and dental issues (all teeth needed pulled)      Dispo: She can follow-up in a year, sooner if needed  Signed, Orren LOISE Fabry, PA-C

## 2024-07-10 ENCOUNTER — Telehealth: Payer: Self-pay

## 2024-07-10 ENCOUNTER — Telehealth: Payer: Self-pay | Admitting: Cardiovascular Disease

## 2024-07-10 ENCOUNTER — Ambulatory Visit: Attending: Physician Assistant | Admitting: Physician Assistant

## 2024-07-10 VITALS — BP 136/82 | HR 81 | Ht 65.0 in | Wt 176.0 lb

## 2024-07-10 DIAGNOSIS — I2510 Atherosclerotic heart disease of native coronary artery without angina pectoris: Secondary | ICD-10-CM

## 2024-07-10 DIAGNOSIS — I340 Nonrheumatic mitral (valve) insufficiency: Secondary | ICD-10-CM

## 2024-07-10 DIAGNOSIS — R0602 Shortness of breath: Secondary | ICD-10-CM

## 2024-07-10 DIAGNOSIS — I10 Essential (primary) hypertension: Secondary | ICD-10-CM

## 2024-07-10 MED ORDER — FUROSEMIDE 20 MG PO TABS: 20.0000 mg | ORAL_TABLET | ORAL | Status: DC | PRN

## 2024-07-10 MED ORDER — FUROSEMIDE 20 MG PO TABS: 20.0000 mg | ORAL_TABLET | ORAL | 3 refills | Status: AC | PRN

## 2024-07-10 NOTE — Telephone Encounter (Signed)
 Left message for [place] at [facility].  Instructions for Lasix  20 mg: take 1 tablet daily as needed, for weight gain of 3-5 lbs may take 20 mg daily for 3 days.  Provided office number for callback if any other questions.

## 2024-07-10 NOTE — Addendum Note (Signed)
 Addended by: Mattie Nordell L on: 07/10/2024 09:26 AM   Modules accepted: Orders

## 2024-07-10 NOTE — Telephone Encounter (Signed)
 Attempted to schedule 6 month follow scan. No answer, call dropped to busy signal. [employer].

## 2024-07-10 NOTE — Telephone Encounter (Signed)
 Pt c/o medication issue:  1. Name of Medication:   furosemide  (LASIX ) 20 MG tablet   2. How are you currently taking this medication (dosage and times per day)?   3. Are you having a reaction (difficulty breathing--STAT)?   4. What is your medication issue?   Caller Lynden) wants to clarify the instructions for this medication.

## 2024-07-10 NOTE — Patient Instructions (Signed)
 Thank you for choosing [facility]!     Medication Instructions:  Take the Lasix  as need for fluid retention.  Monitor weight gain.  If you gain 3 to 5lbs in a week, take lasix  for 3 consecutive days  *If you need a refill on your cardiac medications before your next appointment, please call your pharmacy*   Lab Work: Labs will be drawn today.................... BNP If you have labs (blood work) drawn today and your tests are completely normal, you will receive your results only by: MyChart Message (if you have MyChart) OR A paper copy in the mail If you have any lab test that is abnormal or we need to change your treatment, we will call you to review the results.   Testing/Procedures: Your physician has requested that you have an echocardiogram. Echocardiography is a painless test that uses sound waves to create images of your heart. It provides your doctor with information about the size and shape of your heart and how well your heart's chambers and valves are working. This procedure takes approximately one hour. There are no restrictions for this procedure. Please do NOT wear cologne, perfume, aftershave, or lotions (deodorant is allowed). Please arrive 15 minutes prior to your appointment time.  Please note: We ask at that you not bring children with you during ultrasound (echo/ vascular) testing. Due to room size and safety concerns, children are not allowed in the ultrasound rooms during exams. Our front office staff cannot provide observation of children in our lobby area while testing is being conducted. An adult accompanying a patient to their appointment will only be allowed in the ultrasound room at the discretion of the ultrasound technician under special circumstances. We apologize for any inconvenience.   Your next appointment:   1 year(s)   Provider:   Lonni Cash, MD  OR Orren Fabry     Follow-Up: At [facility], you and your health  needs are our priority.  As part of our continuing mission to provide you with exceptional heart care, we have created designated Provider Care Teams.  These Care Teams include your primary Cardiologist (physician) and Advanced Practice Providers (APPs -  Physician Assistants and Nurse Practitioners) who all work together to provide you with the care you need, when you need it. We recommend signing up for the patient portal called MyChart.  Sign up information is provided on this After Visit Summary.  MyChart is used to connect with patients for Virtual Visits (Telemedicine).  Patients are able to view lab/test results, encounter notes, upcoming appointments, etc.  Non-urgent messages can be sent to your provider as well.   To learn more about what you can do with MyChart, go to [email or web address].

## 2024-07-10 NOTE — Telephone Encounter (Signed)
 Caller Lynden) called again to follow-up on the status of patient's medication instructions.

## 2024-07-11 ENCOUNTER — Ambulatory Visit: Admitting: Family Medicine

## 2024-07-11 ENCOUNTER — Encounter: Payer: Self-pay | Admitting: Family Medicine

## 2024-07-11 VITALS — BP 124/76 | HR 86 | Temp 98.2°F | Ht 64.0 in | Wt 177.7 lb

## 2024-07-11 DIAGNOSIS — Z7289 Other problems related to lifestyle: Secondary | ICD-10-CM | POA: Diagnosis not present

## 2024-07-11 DIAGNOSIS — J301 Allergic rhinitis due to pollen: Secondary | ICD-10-CM

## 2024-07-11 DIAGNOSIS — I2510 Atherosclerotic heart disease of native coronary artery without angina pectoris: Secondary | ICD-10-CM

## 2024-07-11 DIAGNOSIS — E559 Vitamin D deficiency, unspecified: Secondary | ICD-10-CM

## 2024-07-11 DIAGNOSIS — K219 Gastro-esophageal reflux disease without esophagitis: Secondary | ICD-10-CM

## 2024-07-11 DIAGNOSIS — J439 Emphysema, unspecified: Secondary | ICD-10-CM

## 2024-07-11 DIAGNOSIS — J449 Chronic obstructive pulmonary disease, unspecified: Secondary | ICD-10-CM

## 2024-07-11 DIAGNOSIS — I10 Essential (primary) hypertension: Secondary | ICD-10-CM | POA: Diagnosis not present

## 2024-07-11 DIAGNOSIS — G894 Chronic pain syndrome: Secondary | ICD-10-CM | POA: Diagnosis not present

## 2024-07-11 DIAGNOSIS — K580 Irritable bowel syndrome with diarrhea: Secondary | ICD-10-CM

## 2024-07-11 DIAGNOSIS — E538 Deficiency of other specified B group vitamins: Secondary | ICD-10-CM

## 2024-07-11 DIAGNOSIS — R7989 Other specified abnormal findings of blood chemistry: Secondary | ICD-10-CM

## 2024-07-11 DIAGNOSIS — E7800 Pure hypercholesterolemia, unspecified: Secondary | ICD-10-CM

## 2024-07-11 LAB — BRAIN NATRIURETIC PEPTIDE: BNP: 110 pg/mL — ABNORMAL HIGH (ref 0.0–100.0)

## 2024-07-11 NOTE — Patient Instructions (Signed)
  VISIT SUMMARY: Today, we reviewed your medical history and current medications, and discussed management plans for your coronary artery disease, COPD, hypertension, and other health concerns. We also addressed your smoking habits and provided recommendations for your chronic pain and irritable bowel syndrome.  YOUR PLAN: CORONARY ARTERY DISEASE AND HEART FAILURE WITH PRESERVED EJECTION FRACTION: Your coronary artery disease is being managed with Plavix , and your heart function is being monitored. Recent tests show no signs of congestive heart failure. -Continue taking Plavix  75 mg daily. -Await the results of your echocardiogram scheduled for August 21, 2024.  HYPERTENSION: Your blood pressure is slightly elevated, and we have adjusted your medication to manage it. -Continue taking Losartan  100 mg daily, but adjust to 50 mg on days when you take Lasix . -Recheck your blood pressure in November.  CHRONIC OBSTRUCTIVE PULMONARY DISEASE AND ASTHMA: Your COPD and asthma are being managed with inhalers and medication. We discussed switching to a different vape to avoid glycerin exposure. -Continue using albuterol  as needed. -Continue taking Breztri  160/9/4.8 mcg, two puffs in the morning and at bedtime. -Continue taking Singulair  10 mg at bedtime. -Switch to FUM vape as recommended by your pulmonologist.  TOBACCO USE DISORDER, CURRENT DAILY SMOKER: You are a current daily smoker and use a vape without nicotine . We recommend switching to a different vape to avoid glycerin exposure. -Switch to FUM vape as recommended by your pulmonologist.  CAROTID ARTERY DISEASE, STATUS POST LEFT CAROTID STENT: You had a stent placed in your carotid artery due to a blockage. Recent follow-ups show no issues. -Continue routine follow-up with your vascular specialist every two years.  SINONASAL ADENOCARCINOMA, STATUS POST RADIATION: You were treated for sinonasal adenocarcinoma with radiation in 2016. -No specific  plan needed at this time.  MELANOMA OF LEFT KNEE: You have a history of melanoma on your left knee and continue to follow up with dermatology. -Continue follow-up with dermatology as scheduled.  IRRITABLE BOWEL SYNDROME WITH DIARRHEA: You experience chronic diarrhea, often after eating, and manage it with Imodium. -Continue taking Imodium as needed.  DEGENERATIVE DISC DISEASE OF CERVICAL SPINE, STATUS POST C3-C4 FUSION: You have degenerative disc disease and had a C3-C4 fusion. You manage muscle spasms with Flexeril . -Continue taking Flexeril  10 mg as needed for muscle spasms.  CHRONIC PAIN SYNDROME: You have chronic pain and previously used a Butrans  patch, which was discontinued due to respiratory concerns. -Consider alternative pain management options if needed.                      Contains text generated by [employer].                                 Contains text generated by [employer].

## 2024-07-11 NOTE — Progress Notes (Unsigned)
 New patient visit   Patient: Heather Beasley   DOB: Aug 16, 1956   68 y.o. Female  MRN: [id number] Visit Date: 07/11/2024  Today's healthcare provider: Rockie Agent, MD   Chief Complaint  Patient presents with   Establish Care    Patient presents to establish care with new pcp. Currently sees cardio for coronary artery disease, recently evaluated for CHF which she does not have.  Patient has no acute issues to discuss today.    Subjective    Heather Beasley is a 68 y.o. female who presents today as a new patient to establish care.   HPI     Establish Care    Additional comments: Patient presents to establish care with new pcp. Currently sees cardio for coronary artery disease, recently evaluated for CHF which she does not have.  Patient has no acute issues to discuss today.       Last edited by Cherry Chiquita HERO, CMA on 07/11/2024 10:53 AM.       Discussed the use of AI scribe software for clinical note transcription with the patient, who gave verbal consent to proceed.  History of Present Illness [name] is a 68 year old female with coronary artery disease and COPD who presents for establishment of care and medication management.  She has a history of coronary artery disease and COPD. Her cardiologist recently adjusted her Lasix  prescription to as needed based on weight gain of three to five pounds. She is concerned about a previous diagnosis of congestive heart failure. Her BNP levels have been elevated for the past three years, but recent chest X-rays showed no fluid around her heart or lungs. She is on Plavix  75 mg daily for coronary artery disease and Lipitor 20 mg daily for hyperlipidemia. She had a stent placed in her carotid artery due to a 97% blockage from scar tissue caused by previous radiation treatment for sinonasal adenocarcinoma. She reports no issues since the stent placement and follows up regularly with her vascular specialist.  Her COPD is  managed with albuterol  and Singulair . She uses albuterol  one or two puffs every six hours as needed, and Singulair  10 mg at bedtime. She experiences occasional asthma attacks, managed with her inhalers, and follows up with her pulmonologist regularly. She is a current smoker and vapes daily, though she uses a non-nicotine  vape.  She has a history of IBS and experiences diarrhea most days, particularly after eating, managed with Imodium as needed. Her last colonoscopy was in July 2021, and she is due for her next one in 2031. She also has a history of GERD, managed with Protonix  40 mg daily.  She has a history of chronic pain due to degenerative disc disease and cervical spondylosis. She underwent an ACDF between C3 and C4 and uses Flexeril  10 mg as needed for muscle spasms. She was previously prescribed a Butrans  patch for pain management but discontinued its use due to respiratory concerns related to her COPD.  She has a history of sinonasal adenocarcinoma treated with radiation in 2016, resulting in the loss of her natural teeth due to radiation damage. She currently uses dentures but finds the bottom set uncomfortable and does not wear them regularly.      Past Medical History:  Diagnosis Date   Allergic rhinitis    Aortic atherosclerosis (HCC)    Arthritis    Asthma    Bilateral carotid artery disease (HCC)    a.) carotid doppler 06/07/2020 --> 1-39% RICA,  80-99% LICA; b.) s/p PTA 06/27/2020 --> LICA ([id number] mm XACT BMS); c.) carotid doppler 07/30/2020, 10/30/2020, 04/30/2021, 05/19/2022 --> 1-39% RICA   Cancer (HCC)    Cancer of nasal cavity and sinus (HCC) 07/07/2015   a.) 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; T1 N0 M0; s/p adjuvant XRT; did not require systemic chemotherapy   Cervical spondylosis    Colitis    Colon polyps    COPD (chronic obstructive pulmonary disease) (HCC)    Coronary artery disease    a.) cCTA 01/26/2022: Ca score 80.3 (82nd  percentile for age/sex match control)   DDD (degenerative disc disease), cervical    a.) ACDF C3-C6   Depression    Diastolic dysfunction    a.) TTE 06/30/2013: EF 55-6%, mild LAE, mild MR, G1DD; b.) TTE 01/21/2022: EF 70-75%, mod LVH, mild MR, G1DD   Diverticulosis    GERD (gastroesophageal reflux disease)    Glaucoma    she reports that she does not have glacoma, she has thick corneas   Headaches, cluster    Heart murmur    HTN (hypertension)    Hyperlipidemia    IBS (irritable bowel syndrome)    Kidney stones    Long term current use of antithrombotics/antiplatelets    a.) clopidogrel    Lumbar adjacent segment disease with spondylolisthesis    Melanoma (HCC) 1995   above the L knee    Neuropathy    Neuropathy of left lower extremity    Nose colonized with MRSA 02/16/2023   a.) surgical PCR (+) 02/16/2023 prior to L3-4 LATERAL LUMBAR INTERBODY FUSION; L3-S1 POSTERIOR SPINAL FUSION   PFO (patent foramen ovale)    a.) cCTA 01/26/2022 --> PFO with L to R contrast flow   Pneumonia    PONV (postoperative nausea and vomiting)    2012 - during lumbar fusion she developed spinal fluid leak, and had to have 3 units on Blood, BP dropped, and in ICU,   Tobacco abuse     Outpatient Medications Prior to Visit  Medication Sig   acetaminophen  (TYLENOL ) 650 MG CR tablet Take 650 mg by mouth every 8 (eight) hours as needed for pain.   albuterol  (VENTOLIN  HFA) 108 (90 Base) MCG/ACT inhaler Inhale 1-2 puffs into the lungs every 6 (six) hours as needed (for cough).   atorvastatin  (LIPITOR) 20 MG tablet TAKE ONE TABLET BY MOUTH ONCE A DAY   benzonatate  (TESSALON ) 200 MG capsule Take 200 mg by mouth. nightly   Budeson-Glycopyrrol-Formoterol  (BREZTRI  AEROSPHERE) 160-9-4.8 MCG/ACT AERO Inhale 2 puffs into the lungs in the morning and at bedtime.   cetirizine (ZYRTEC) 10 MG tablet Take 10 mg by mouth daily. Allergies.   Cholecalciferol (VITAMIN D ) 50 MCG (2000 UT) CAPS Take 2,000 Units by mouth.    clopidogrel  (PLAVIX ) 75 MG tablet Take 1 tablet (75 mg total) by mouth daily.   cyclobenzaprine  (FLEXERIL ) 10 MG tablet TAKE ONE TABLET (10 MG TOTAL) BY MOUTH THREE TIMES DAILY AS NEEDED FOR MUSCLE SPASMS.   EPINEPHrine  0.3 mg/0.3 mL IJ SOAJ injection Inject 1 mg into the skin as needed.   furosemide  (LASIX ) 20 MG tablet Take 1 tablet (20 mg total) by mouth as needed for fluid. For weight gain of 3 to 5 pounds a week, take 1 tablet for 3 consecutive days.   losartan  (COZAAR ) 100 MG tablet Take 1 tablet (100 mg total) by mouth daily.   montelukast  (SINGULAIR ) 10 MG tablet TAKE ONE TABLET BY MOUTH AT BEDTIME   Multiple Vitamins-Minerals (CENTRUM  ADULTS PO) Take 1 tablet by mouth.   oxymetazoline  (AFRIN) 0.05 % nasal spray Place 1 spray into both nostrils 2 (two) times daily as needed for congestion. Use for nose bleeds   pantoprazole  (PROTONIX ) 40 MG tablet Take 1 tablet (40 mg total) by mouth daily.   terbinafine  (LAMISIL ) 250 MG tablet Take 1 tablet (250 mg total) by mouth daily.   [DISCONTINUED] predniSONE  (DELTASONE ) 20 MG tablet Take two tablets once daily for five days   No facility-administered medications prior to visit.    Past Surgical History:  Procedure Laterality Date   ABDOMINAL HYSTERECTOMY  1998   ANTERIOR LUMBAR FUSION N/A 03/01/2023   Procedure: L3-4 LATERAL LUMBAR INTERBODY FUSION;  Surgeon: Clois Fret, MD;  Location: [facility];  Service: Neurosurgery;  Laterality: N/A;   APPENDECTOMY  1998   APPLICATION OF INTRAOPERATIVE CT SCAN N/A 03/01/2023   Procedure: APPLICATION OF INTRAOPERATIVE CT SCAN;  Surgeon: Clois Fret, MD;  Location: [facility];  Service: Neurosurgery;  Laterality: N/A;   BACK SURGERY     CAROTID PTA/STENT INTERVENTION Left 06/27/2020   Procedure: CAROTID PTA/STENT INTERVENTION;  Surgeon: Marea Selinda RAMAN, MD;  Location: [facility];  Service: Cardiovascular;  Laterality: Left;   CATARACT EXTRACTION     CERVICAL FUSION  2010   C3-C6    CHOLECYSTECTOMY  08/2011   Procedure: Cholecystectomy; Location: [facility]; Surgeon: Unknown Sharps, MD   COLONOSCOPY N/A    Procedure: COLONOSCOPY; Location: [facility]; Surgeon: Lamar Holmes, MD   CYSTOSCOPY N/A    Procedure: CYSTOSCOPY; Location: [facility]; Surgeon: Glendia Barba, MD   DILATION AND CURETTAGE OF UTERUS     ESOPHAGOGASTRODUODENOSCOPY  2012   Procedure: ESOPHAGOGASTRODUODENOSCOPY; Location: [facility]; Surgeon: Lamar Holmes, MD   FRACTURE SURGERY  2001   plate in right wrist and arm   IMAGE GUIDED SINUS SURGERY N/A 06/25/2015   Procedure: IMAGE GUIDED SINUS SURGERY;  Surgeon: Chinita Hasten, MD;  Location: [facility];  Service: ENT;  Laterality: N/A;   LUMBAR SPINE SURGERY  07/2004   OOPHORECTOMY     POLYPECTOMY N/A 06/25/2015   Procedure: POLYPECTOMY NASAL;  Surgeon: Chinita Hasten, MD;  Location: [facility];  Service: ENT;  Laterality: N/A;   TONSILLECTOMY  1968   VAGINAL DELIVERY     x2   Family Status  Relation Name Status   Mother Colon cancer Alive   Father Pancreatic, colon cancer Deceased   PGM Breast cancer Deceased   Brother Koren Pizza Alive   Sister  Alive   Brother  Alive  No partnership data on file   Family History  Problem Relation Age of Onset   Hypertension Mother    Hyperlipidemia Mother    Diabetes Mother    Cancer Mother    Colon cancer Father    Pancreatic cancer Father    Cancer Father        Colon & Pancreatic   Breast cancer Paternal Grandmother 38   Diabetes Paternal Grandmother    Coronary artery disease Paternal Grandmother    Heart failure Paternal Grandmother    Cancer Paternal Grandmother        breast   Stroke Brother    Social History   Socioeconomic History   Marital status: Married    Spouse name: Nancyann   Number of children: 2   Years of education: Not on file   Highest education level: Not on file  Occupational History   Occupation: Conservation officer, nature   Occupation: disabled  Tobacco Use   Smoking status: Every  Day    Types:  E-cigarettes   Smokeless tobacco: Never   Tobacco comments:    Using vape with no nicotine    Vaping Use   Vaping status: Every Day   Substances: Flavoring  Substance and Sexual Activity   Alcohol use: Not Currently   Drug use: No   Sexual activity: Not Currently    Partners: Male  Other Topics Concern   Not on file  Social History Narrative   Lives in [place] with husband. Dog in home. Work - disabled for neck and back pain.   Social Drivers of Corporate investment banker Strain: Low Risk  (03/15/2024)   Overall Financial Resource Strain (CARDIA)    Difficulty of Paying Living Expenses: Not hard at all  Food Insecurity: No Food Insecurity (03/15/2024)   Hunger Vital Sign    Worried About Running Out of Food in the Last Year: Never true    Ran Out of Food in the Last Year: Never true  Transportation Needs: No Transportation Needs (03/15/2024)   PRAPARE - Administrator, Civil Service (Medical): No    Lack of Transportation (Non-Medical): No  Physical Activity: Inactive (03/15/2024)   Exercise Vital Sign    Days of Exercise per Week: 0 days    Minutes of Exercise per Session: 0 min  Stress: No Stress Concern Present (03/15/2024)   [employer] of Occupational Health - Occupational Stress Questionnaire    Feeling of Stress : Not at all  Social Connections: Moderately Isolated (03/15/2024)   Social Connection and Isolation Panel    Frequency of Communication with Friends and Family: More than three times a week    Frequency of Social Gatherings with Friends and Family: Twice a week    Attends Religious Services: Never    Database administrator or Organizations: No    Attends Engineer, structural: Never    Marital Status: Married     Allergies  Allergen Reactions   Albumin (Human) Rash   Amoxicillin-Pot Clavulanate Hives   Gabapentin Shortness Of Breath   Latex Dermatitis and Rash   Betadine [Povidone Iodine ] Dermatitis, Rash and Other (See  Comments)    Skin burning Topical betadine and iodine  have this reaction with patient.  IV contrast is NOT a problem.  jkl   Sucralfate     Other reaction(s): Other (See Comments) Abdominal Pain   Amoxicillin     REACTION: rash   Chantix [Varenicline Tartrate]     Heart racing   Erythromycin     REACTION: rash   Sucralfate Nausea And Vomiting    Heart racing Lightheaded    Tegaderm Ag Mesh [Silver] Hives    Mild     Immunization History  Administered Date(s) Administered   Influenza Split 07/27/2011, 08/25/2012   Influenza Whole 09/14/2007   Influenza,inj,Quad PF,6+ Mos 08/10/2013, 08/01/2015, 07/16/2016, 08/02/2018, 07/15/2019   Influenza,inj,quad, With Preservative 08/13/2017   Influenza-Unspecified 07/26/2021, 08/05/2022, 07/29/2023   PNEUMOCOCCAL CONJUGATE-20 08/18/2023   Pneumococcal Conjugate-13 07/16/2016   Pneumococcal Polysaccharide-23 11/27/2011, 03/01/2018   Respiratory Syncytial Virus Vaccine,Recomb Aduvanted(Arexvy) 08/26/2022   Td 04/04/2024    Health Maintenance  Topic Date Due   COVID-19 Vaccine (1) 07/27/2024 (Originally 06/14/1961)   Zoster Vaccines- Shingrix (1 of 2) 09/21/2024 (Originally 06/13/1977)   Influenza Vaccine  01/23/2025 (Originally 05/26/2024)   Hepatitis C Screening  06/21/2025 (Originally 06/13/1976)   Medicare Annual Wellness (AWV)  03/15/2025   Mammogram  06/28/2026   Colonoscopy  05/02/2030   Pneumococcal  Vaccine: 50+ Years  Completed   DEXA SCAN  Completed   HPV VACCINES  Aged Out   Meningococcal B Vaccine  Aged Out   Lung Cancer Screening  Discontinued   DTaP/Tdap/Td  Discontinued    Patient Care Team: Sharma Coyer, MD as PCP - General (Family Medicine) Verlin Lonni BIRCH, MD as PCP - Cardiology (Cardiology) Lenn Aran, MD as Referring Physician (Radiation Oncology) Herminio Miu, MD (Otolaryngology) Clois Fret, MD as Consulting Physician (Neurosurgery) Delores Orvin BRAVO, NP as Nurse  Practitioner (Vascular Surgery) Pa, [facility] Lavonia Lye, MD as Consulting Physician (Ophthalmology) Tamea Dedra CROME, MD as Consulting Physician (Pulmonary Disease)  Review of Systems      Objective    BP 124/76 (BP Location: Left Arm, Patient Position: Sitting, Cuff Size: Normal)   Pulse 86   Temp 98.2 F (36.8 C) (Oral)   Ht 5' 4 (1.626 m)   Wt 177 lb 11.2 oz (80.6 kg)   SpO2 96%   BMI 30.50 kg/m      Depression Screen    07/11/2024   10:48 AM 06/21/2024    9:32 AM 05/30/2024   10:49 AM 03/16/2024    9:59 AM  PHQ 2/9 Scores  PHQ - 2 Score 0 0 0 1  PHQ- 9 Score 2 1  3    No results found for any visits on 07/11/24.   Physical Exam Vitals reviewed.  Constitutional:      General: She is not in acute distress.    Appearance: Normal appearance. She is not ill-appearing.  Cardiovascular:     Rate and Rhythm: Normal rate and regular rhythm.  Pulmonary:     Effort: Pulmonary effort is normal. No respiratory distress.     Breath sounds: No wheezing, rhonchi or rales.  Neurological:     Mental Status: She is alert and oriented to person, place, and time.  Psychiatric:        Mood and Affect: Mood normal.        Behavior: Behavior normal.        Assessment & Plan      Problem List Items Addressed This Visit     Allergic rhinitis   Chronic pain syndrome   Coronary artery disease involving native coronary artery of native heart without angina pectoris   Current every day vaping   Elevated brain natriuretic peptide (BNP) level   Essential hypertension, benign - Primary   GERD (gastroesophageal reflux disease)   HLD (hyperlipidemia)   Irritable bowel syndrome with diarrhea   Pulmonary emphysema (HCC)   Vitamin B12 deficiency   Vitamin D  deficiency    Assessment and Plan Assessment & Plan Coronary artery disease and heart failure with preserved ejection fraction Chronic Coronary artery disease managed with Plavix . Heart failure with  preserved ejection fraction, ejection fraction previously at 70%. No signs of congestive heart failure on recent chest X-ray. Awaiting updated echocardiogram to assess valve function and heart pumping. - Continue Plavix  75 mg daily - Await echocardiogram results scheduled for August 21, 2024 - f.u with cardiology as scheduled   Hypertension Chronic  Blood pressure slightly elevated at 146/75 mmHg. Losartan  100 mg daily prescribed, with adjustment to 50 mg on days when Lasix  is taken to manage potential blood pressure drop. - Continue losartan  100 mg daily, adjust to 50 mg on days when Lasix  is taken - Recheck blood pressure in November - continue f/u with cardiology   Chronic obstructive pulmonary disease and asthma COPD and asthma managed  with albuterol , Breztri , and Singulair . Recent asthma exacerbation managed with inhalers. Pulmonologist recommended switching to FUM vape to avoid glycerin exposure. - Continue albuterol  as needed - Continue Breztri  160/9/4.8 mcg two puffs in the morning and at bedtime - Continue Singulair  10 mg at bedtime - f/u with pulmonology as scheduled   Tobacco use disorder, current daily smoker Current daily smoker, uses vape without nicotine . Pulmonologist recommended switching to FUM vape to avoid glycerin exposure. - Switch to FUM vape as recommended by pulmonologist  Carotid artery disease, status post left carotid stent Status post left carotid stent placement due to 97% blockage from scar tissue, likely secondary to radiation. Recent follow-up showed no issues with the stent. - Continue routine follow-up with vascular specialist every two years  Sinonasal adenocarcinoma, status post radiation Sinonasal adenocarcinoma treated with radiation in 2016. - continue follow up with oncology  Melanoma of left knee Melanoma on the left knee. Continues follow-up with dermatology. - Continue follow-up with dermatology as scheduled  Irritable bowel syndrome  with diarrhea Chronic diarrhea, often postprandial. Managed with Imodium as needed. Previous colonoscopy in 2021, next colonoscopy due in 10 years. - Continue OTC Imodium as needed  Degenerative disc disease of cervical spine, status post C3-C4 fusion Degenerative disc disease with history of C3-C4 fusion. Managed with Flexeril  for muscle spasms. - Continue Flexeril  10 mg as needed for muscle spasms  Chronic pain syndrome Chronic pain managed with previous Butrans  patch, discontinued due to respiratory concerns. Considering canceling pain management follow-up. - Consider alternative pain management options if needed      Return in about 2 months (around 09/10/2024) for HTN.      Rockie Agent, MD  [facility] [phone] (phone) [phone] (fax)  [facility]

## 2024-07-13 ENCOUNTER — Ambulatory Visit: Payer: Self-pay | Admitting: Physician Assistant

## 2024-07-14 DIAGNOSIS — H26491 Other secondary cataract, right eye: Secondary | ICD-10-CM | POA: Diagnosis not present

## 2024-07-17 ENCOUNTER — Ambulatory Visit
Admission: RE | Admit: 2024-07-17 | Discharge: 2024-07-17 | Disposition: A | Discharge status: Home / Self Care | Source: Ambulatory Visit | Attending: Acute Care | Admitting: Acute Care

## 2024-07-17 DIAGNOSIS — R911 Solitary pulmonary nodule: Secondary | ICD-10-CM | POA: Insufficient documentation

## 2024-07-17 DIAGNOSIS — J301 Allergic rhinitis due to pollen: Secondary | ICD-10-CM | POA: Diagnosis not present

## 2024-07-17 DIAGNOSIS — Z122 Encounter for screening for malignant neoplasm of respiratory organs: Secondary | ICD-10-CM | POA: Diagnosis not present

## 2024-07-18 ENCOUNTER — Ambulatory Visit: Attending: Student in an Organized Health Care Education/Training Program | Admitting: Nurse Practitioner

## 2024-07-18 ENCOUNTER — Encounter: Payer: Self-pay | Admitting: Nurse Practitioner

## 2024-07-18 VITALS — BP 134/60 | HR 99 | Temp 98.0°F | Resp 16 | Ht 64.0 in | Wt 177.0 lb

## 2024-07-18 DIAGNOSIS — M792 Neuralgia and neuritis, unspecified: Secondary | ICD-10-CM | POA: Diagnosis not present

## 2024-07-18 DIAGNOSIS — M5416 Radiculopathy, lumbar region: Secondary | ICD-10-CM | POA: Insufficient documentation

## 2024-07-18 DIAGNOSIS — M5412 Radiculopathy, cervical region: Secondary | ICD-10-CM | POA: Insufficient documentation

## 2024-07-18 DIAGNOSIS — G8929 Other chronic pain: Secondary | ICD-10-CM | POA: Diagnosis not present

## 2024-07-18 DIAGNOSIS — G894 Chronic pain syndrome: Secondary | ICD-10-CM | POA: Diagnosis not present

## 2024-07-18 DIAGNOSIS — Z79899 Other long term (current) drug therapy: Secondary | ICD-10-CM | POA: Diagnosis not present

## 2024-07-18 DIAGNOSIS — M961 Postlaminectomy syndrome, not elsewhere classified: Secondary | ICD-10-CM | POA: Diagnosis not present

## 2024-07-18 MED ORDER — TRAMADOL HCL 50 MG PO TABS: 50.0000 mg | ORAL_TABLET | Freq: Two times a day (BID) | ORAL | 0 refills | Status: DC | PRN

## 2024-07-18 NOTE — Progress Notes (Signed)
 Safety precautions to be maintained throughout the outpatient stay will include: orient to surroundings, keep bed in low position, maintain call bell within reach at all times, provide assistance with transfer out of bed and ambulation.   Patient did not bring patches today. She has three at home and is not wearing them due to having Breathing issues, COPD. Heart issues, tried one for 5 days. PCP and pulm. Instructed not to use.  Patient will take a total 1300mg  of Tylenol  per day.

## 2024-07-18 NOTE — Patient Instructions (Signed)

## 2024-07-18 NOTE — Progress Notes (Signed)
 PROVIDER NOTE: Interpretation of information contained herein should be left to medically-trained personnel. Specific patient instructions are provided elsewhere under Patient Instructions section of medical record. This document was created in part using AI and STT-dictation technology, any transcriptional errors that may result from this process are unintentional.  Patient: Heather Beasley  Service: E/M   PCP: Sharma Coyer, MD  DOB: Feb 27, 1956  DOS: 07/18/2024  Provider: Emmy MARLA Blanch, NP  MRN: [id number]  Delivery: Face-to-face  Specialty: Interventional Pain Management  Type: Established Patient  Setting: Ambulatory outpatient facility  Specialty designation: 09  Referring Prov.: Corwin Antu, FNP  Location: Outpatient office facility       History of present illness (HPI) Heather Beasley, a 68 y.o. year old female, is here today because of her Chronic pain syndrome [G89.4]. Heather Beasley primary complain today is Back Pain (mid)  Pertinent problems: Heather Beasley has Vitamin D  deficiency; Chronic headaches; Osteopenia; Primary osteoarthritis involving multiple joints; Carotid stenosis, left; Pulmonary emphysema (HCC); Pulmonary nodule less than 6 mm determined by computed tomography of lung; Pain in limb; Lumbar radiculopathy; History of left foot drop; Accidental fall; Lumbar spinal stenosis due to adjacent segment disease after fusion procedure (L4-S1 fusion, Left L3/4); Lumbar post-laminectomy syndrome; Chronic pain syndrome; and Sacroiliac joint pain on their pertinent problem list.   Pain Assessment: Severity of Chronic pain is reported as a 4 /10. Location: Back Mid/around to the fron under rib cage. Onset: More than a month ago. Quality: Aching, Constant, Crushing, Dull, Discomfort, Stabbing. Timing: Constant. Modifying factor(s): rest, lying down. Vitals:  height is 5' 4 (1.626 m) and weight is 177 lb (80.3 kg). Her temperature is 98 F (36.7 C). Her blood pressure is  134/60 and her pulse is 99. Her respiration is 16 and oxygen saturation is 98%.  BMI: Estimated body mass index is 30.38 kg/m as calculated from the following:   Height as of this encounter: 5' 4 (1.626 m).   Weight as of this encounter: 177 lb (80.3 kg).  Last encounter: Visit date not found. Last procedure: Visit date not found.  Reason for encounter: medication management. No change in medical history since last visit.  Patient's pain is at baseline.  Patient continues multimodal pain regimen as prescribed.  States that it provides pain relief and improvement in functional status.  The patient has a history of COPD and pulmonary emphysema.  She continues to experience low back pain that interferes with her daily activities. Dr. Marcelino initiated Butrans  5 mcg/h patch; however due to her COPD, she developed her breathing difficulties with the patch.  Discontinue Butrans  patch.  We discussed alternative pain management options, and she was started on low-dose tramadol  50 mg every 12 hours as needed for pain.  Discontinue Butrans  patch. Pharmacotherapy Assessment   Started tramadol  (Ultram ) 50 mg every 12 hours as needed for pain. Monitoring: [place] PMP: PDMP reviewed during this encounter.       Pharmacotherapy: No side-effects or adverse reactions reported. Compliance: No problems identified. Effectiveness: Clinically acceptable.  Dorlene Montie FALCON, RN  07/18/2024  9:45 AM  Sign when Signing Visit Safety precautions to be maintained throughout the outpatient stay will include: orient to surroundings, keep bed in low position, maintain call bell within reach at all times, provide assistance with transfer out of bed and ambulation.   Patient did not bring patches today. She has three at home and is not wearing them due to having Breathing issues, COPD. Heart issues, tried one for  5 days. PCP and pulm. Instructed not to use.  Patient will take a total 1300mg  of Tylenol  per day.    UDS:  Summary   Date Value Ref Range Status  05/15/2024 FINAL  Final    Comment:    ==================================================================== Compliance Drug Analysis, Ur ==================================================================== Test                             Result       Flag       Units  Drug Present and Declared for Prescription Verification   Desmethylcyclobenzaprine       PRESENT      EXPECTED    Desmethylcyclobenzaprine is an expected metabolite of    cyclobenzaprine .    Acetaminophen                   PRESENT      EXPECTED ==================================================================== Test                      Result    Flag   Units      Ref Range   Creatinine              65               mg/dL      >=79 ==================================================================== Declared Medications:  The flagging and interpretation on this report are based on the  following declared medications.  Unexpected results may arise from  inaccuracies in the declared medications.   **Note: The testing scope of this panel includes these medications:   Cyclobenzaprine  (Flexeril )   **Note: The testing scope of this panel does not include small to  moderate amounts of these reported medications:   Acetaminophen  (Tylenol )   **Note: The testing scope of this panel does not include the  following reported medications:   Albuterol  (Ventolin  HFA)  Atorvastatin  (Lipitor)  Budesonide (Breztri  Aerosphere)  Cephalexin  (Keflex )  Cetirizine (Zyrtec)  Clopidogrel  (Plavix )  Epinephrine  (EpiPen )  Formoterol  (Breztri  Aerosphere)  Furosemide  (Lasix )  Glycopyrrolate  (Breztri  Aerosphere)  Losartan  (Cozaar )  Montelukast  (Singulair )  Multivitamin  Oxymetazoline  (Afrin)  Pantoprazole  (Protonix )  Terbinafine  (Lamisil )  Vitamin D  ==================================================================== For clinical consultation, please call [phone]. ====================================================================     No results found for: CBDTHCR No results found for: D8THCCBX No results found for: D9THCCBX  ROS  Constitutional: Denies any fever or chills Gastrointestinal: No reported hemesis, hematochezia, vomiting, or acute GI distress Musculoskeletal: Low back pain Neurological: No reported episodes of acute onset apraxia, aphasia, dysarthria, agnosia, amnesia, paralysis, loss of coordination, or loss of consciousness  Medication Review  EPINEPHrine , Multiple Vitamins-Minerals, Vitamin D , acetaminophen , albuterol , atorvastatin , benzonatate , budesonide-glycopyrrolate -formoterol , cetirizine, clopidogrel , cyclobenzaprine , furosemide , losartan , montelukast , oxymetazoline , pantoprazole , terbinafine , and traMADol   History Review  Allergy: Heather Beasley is allergic to albumin (human), amoxicillin-pot clavulanate, gabapentin, latex, betadine [povidone iodine ], sucralfate, amoxicillin, chantix [varenicline tartrate], erythromycin, sucralfate, and tegaderm ag mesh [silver]. Drug: Heather Beasley  reports no history of drug use. Alcohol:  reports that she does not currently use alcohol. Tobacco:  reports that she has been smoking e-cigarettes. She has never used smokeless tobacco. Social: Heather Beasley  reports that she has been smoking e-cigarettes. She has never used smokeless tobacco. She reports that she does not currently use alcohol. She reports that she does not use drugs. Medical:  has a past medical history of Allergic rhinitis, Aortic atherosclerosis, Arthritis, Asthma, Bilateral carotid artery disease, Cancer (HCC), Cancer of nasal  cavity and sinus (HCC) (07/07/2015), Cervical spondylosis, Colitis, Colon polyps, COPD (chronic obstructive pulmonary disease) (HCC), Coronary artery disease, DDD (degenerative disc disease), cervical, Depression, Diastolic dysfunction, Diverticulosis, GERD (gastroesophageal reflux disease),  Glaucoma, Headaches, cluster, Heart murmur, HTN (hypertension), Hyperlipidemia, IBS (irritable bowel syndrome), Kidney stones, Long term current use of antithrombotics/antiplatelets, Lumbar adjacent segment disease with spondylolisthesis, Melanoma (HCC) (1995), Neuropathy, Neuropathy of left lower extremity, Nose colonized with MRSA (02/16/2023), PFO (patent foramen ovale), Pneumonia, PONV (postoperative nausea and vomiting), and Tobacco abuse. Surgical: Heather Beasley  has a past surgical history that includes Appendectomy (1998); Abdominal hysterectomy (1998); Lumbar spine surgery (07/2004); Vaginal delivery; Cervical fusion (2010); Cholecystectomy (08/2011); Esophagogastroduodenoscopy (2012); Colonoscopy (N/A); Tonsillectomy (1968); Cystoscopy (N/A); Back surgery; Dilation and curettage of uterus; Fracture surgery (2001); Image guided sinus surgery (N/A, 06/25/2015); Polypectomy (N/A, 06/25/2015); Oophorectomy; CAROTID PTA/STENT INTERVENTION (Left, 06/27/2020); Cataract extraction; Anterior lumbar fusion (N/A, 03/01/2023); and Application of intraoperative CT scan (N/A, 03/01/2023). Family: family history includes Breast cancer (age of onset: 60) in her paternal grandmother; Cancer in her father, mother, and paternal grandmother; Colon cancer in her father; Coronary artery disease in her paternal grandmother; Diabetes in her mother and paternal grandmother; Heart failure in her paternal grandmother; Hyperlipidemia in her mother; Hypertension in her mother; Pancreatic cancer in her father; Stroke in her brother.  Laboratory Chemistry Profile   Renal Lab Results  Component Value Date   BUN 13 06/21/2024   CREATININE 0.95 06/21/2024   BCR 16 01/12/2023   GFR 62.65 06/21/2024   GFRAA >60 06/28/2020   GFRNONAA >60 12/31/2021    Hepatic Lab Results  Component Value Date   AST 20 06/21/2024   ALT 13 06/21/2024   ALBUMIN 4.2 06/21/2024   ALKPHOS 83 06/21/2024   LIPASE 16 01/04/2012    Electrolytes Lab  Results  Component Value Date   NA 143 06/21/2024   K 4.6 06/21/2024   CL 103 06/21/2024   CALCIUM  9.4 06/21/2024   MG 1.8 06/21/2024    Bone Lab Results  Component Value Date   VD25OH 43.63 08/25/2023    Inflammation (CRP: Acute Phase) (ESR: Chronic Phase) Lab Results  Component Value Date   ESRSEDRATE 2 11/17/2012   LATICACIDVEN 1.0 06/11/2011         Note: Above Lab results reviewed.  Recent Imaging Review  DG Chest 2 View EXAM: 2 VIEW(S) XRAY OF THE CHEST 06/29/2024 10:09:25 AM  COMPARISON: 12/31/2023  CLINICAL HISTORY: Elevated BNP.  FINDINGS:  LUNGS AND PLEURA: No focal pulmonary opacity. No pulmonary edema. No pleural effusion. No pneumothorax.  HEART AND MEDIASTINUM: No acute abnormality of the cardiac and mediastinal silhouettes.  BONES AND SOFT TISSUES: No acute fracture or destructive lesion. Multilevel thoracic osteophytosis. Partially visualized cervical fusion hardware.  IMPRESSION: 1. No acute process.  Electronically signed by: Norman Gatlin MD 07/07/2024 02:45 AM EDT RP Workstation: [id number] Note: Reviewed        Physical Exam  Vitals: BP 134/60   Pulse 99   Temp 98 F (36.7 C)   Resp 16   Ht 5' 4 (1.626 m)   Wt 177 lb (80.3 kg)   SpO2 98%   BMI 30.38 kg/m  BMI: Estimated body mass index is 30.38 kg/m as calculated from the following:   Height as of this encounter: 5' 4 (1.626 m).   Weight as of this encounter: 177 lb (80.3 kg). Ideal: Ideal body weight: 54.7 kg (120 lb 9.5 oz) Adjusted ideal body weight: 64.9 kg (143 lb 2.5 oz) General  appearance: Well nourished, well developed, and well hydrated. In no apparent acute distress Mental status: Alert, oriented x 3 (person, place, & time)       Respiratory: No evidence of acute respiratory distress Eyes: PERLA   Assessment   Diagnosis Status  1. Chronic pain syndrome   2. Cervical radicular pain   3. Chronic radicular lumbar pain   4. Radicular pain in right arm    5. Lumbar post-laminectomy syndrome   6. Medication management    Controlled Controlled Controlled   Updated Problems: No problems updated.  Plan of Care  Problem-specific:  Assessment and Plan Butrans  patch discontinued by her pulmonary provider.  Started tramadol  (Ultram ) 50 mg tablet every 12 hours as needed for pain for up to 30 days.  Prescribing drug monitoring (PDMP) reviewed; findings consistent with the use of medication.  Urine drug screening (UDS) up-to-date.  The patient was advised to closely monitor for any COPD or asthma related symptoms with tramadol  and to discontinue the medication if symptoms worsen.  Schedule follow-up in 30 days for medication management evaluation.   Heather Beasley has a current medication list which includes the following long-term medication(s): albuterol , atorvastatin , furosemide , losartan , montelukast , and pantoprazole .  Pharmacotherapy (Medications Ordered): Meds ordered this encounter  Medications   traMADol  (ULTRAM ) 50 MG tablet    Sig: Take 1 tablet (50 mg total) by mouth every 12 (twelve) hours as needed for severe pain (pain score 7-10).    Dispense:  60 tablet    Refill:  0    Fill one day early if pharmacy is closed on scheduled refill date. Do not fill until: To last until:   Orders:  No orders of the defined types were placed in this encounter.     Return in about 1 month (around 08/17/2024) for (F2F), (MM), Emmy Blanch NP.    Recent Visits Date Type Provider Dept  05/30/24 Office Visit Marcelino Nurse, MD [facility]  05/09/24 Office Visit Marcelino Nurse, MD [facility]  Showing recent visits within past 90 days and meeting all other requirements Today's Visits Date Type Provider Dept  07/18/24 Office Visit Shanica Castellanos K, NP [facility]  Showing today's visits and meeting all other requirements Future Appointments Date Type Provider Dept  08/17/24 Appointment Deontra Pereyra K, NP  [facility]  Showing future appointments within next 90 days and meeting all other requirements  I discussed the assessment and treatment plan with the patient. The patient was provided an opportunity to ask questions and all were answered. The patient agreed with the plan and demonstrated an understanding of the instructions.  Patient advised to call back or seek an in-person evaluation if the symptoms or condition worsens. I personally spent a total of 30 minutes in the care of the patient today including preparing to see the patient, getting/reviewing separately obtained history, performing a medically appropriate exam/evaluation, counseling and educating, placing orders, documenting clinical information in the EHR, independently interpreting results, communicating results, and coordinating care.   Duration of encounter:  minutes.  Total time on encounter, as per AMA guidelines included both the face-to-face and non-face-to-face time personally spent by the physician and/or other qualified health care professional(s) on the day of the encounter (includes time in activities that require the physician or other qualified health care professional and does not include time in activities normally performed by clinical staff). Physician's time may include the following activities when performed: Preparing to see the patient (e.g.,  pre-charting review of records, searching for previously ordered imaging, lab work, and nerve conduction tests) Review of prior analgesic pharmacotherapies. Reviewing PMP Interpreting ordered tests (e.g., lab work, imaging, nerve conduction tests) Performing post-procedure evaluations, including interpretation of diagnostic procedures Obtaining and/or reviewing separately obtained history Performing a medically appropriate examination and/or evaluation Counseling and educating the patient/family/caregiver Ordering medications, tests, or procedures Referring and  communicating with other health care professionals (when not separately reported) Documenting clinical information in the electronic or other health record Independently interpreting results (not separately reported) and communicating results to the patient/ family/caregiver Care coordination (not separately reported)  Note by: Emmy MARLA Blanch, NP  Date: 07/18/2024; Time: 10:37 AM

## 2024-07-20 ENCOUNTER — Encounter: Admitting: Student in an Organized Health Care Education/Training Program

## 2024-07-21 ENCOUNTER — Ambulatory Visit: Admitting: Family

## 2024-07-21 VITALS — BP 144/96 | HR 85 | Temp 98.2°F | Ht 65.0 in | Wt 178.4 lb

## 2024-07-21 DIAGNOSIS — G894 Chronic pain syndrome: Secondary | ICD-10-CM

## 2024-07-21 DIAGNOSIS — M5412 Radiculopathy, cervical region: Secondary | ICD-10-CM | POA: Diagnosis not present

## 2024-07-21 DIAGNOSIS — J301 Allergic rhinitis due to pollen: Secondary | ICD-10-CM | POA: Diagnosis not present

## 2024-07-21 MED ORDER — DULOXETINE HCL 20 MG PO CPEP: 20.0000 mg | ORAL_CAPSULE | Freq: Every day | ORAL | 2 refills | Status: AC

## 2024-07-21 NOTE — Progress Notes (Signed)
 Established Patient Office Visit  Subjective:      CC:  Chief Complaint  Patient presents with   Medical Management of Chronic Issues    HPI: Heather Beasley is a 68 y.o. female presenting on 07/21/2024 for Medical Management of Chronic Issues .  Discussed the use of AI scribe software for clinical note transcription with the patient, who gave verbal consent to proceed.  History of Present Illness Heather Beasley is a 68 year old female with asthma and congestive heart failure who presents for follow up   She has been experiencing ongoing shortness of breath, which was persistent but has recently improved. Prednisone  was previously prescribed to help with breathing but was discontinued after a phone call from the office. She was prescribed Lasix  (furosemide ) for five days, and her BNP levels decreased from 260 to 110.  She has a history of smoking and uses Lasix  as needed for swelling, but has not needed it recently due to low blood pressure. She switched from Zyrtec to Allegra for allergy management. She experienced a shingles outbreak and was treated with Valtrex .  She has been experiencing upper back pain, for which she was prescribed tramadol . However, tramadol  makes her feel at risk to drive and does not provide adequate relief during the day. She has had two epidural shots for her back pain, which she feels were ineffective. She has a history of taking gabapentin, which caused significant side effects, including shortness of breath and low oxygen levels, leading to an ER visit.         Social history:  Relevant past medical, surgical, family and social history reviewed and updated as indicated. Interim medical history since our last visit reviewed.  Allergies and medications reviewed and updated.  DATA REVIEWED: CHART IN EPIC     ROS: Negative unless specifically indicated above in HPI.    Current Outpatient Medications:    acetaminophen  (TYLENOL ) 650 MG CR  tablet, Take 650 mg by mouth every 8 (eight) hours as needed for pain., Disp: , Rfl:    albuterol  (VENTOLIN  HFA) 108 (90 Base) MCG/ACT inhaler, Inhale 1-2 puffs into the lungs every 6 (six) hours as needed (for cough)., Disp: 18 g, Rfl: 2   atorvastatin  (LIPITOR) 20 MG tablet, TAKE ONE TABLET BY MOUTH ONCE A DAY, Disp: 90 tablet, Rfl: 3   benzonatate  (TESSALON ) 200 MG capsule, Take 200 mg by mouth. nightly, Disp: , Rfl:    Budeson-Glycopyrrol-Formoterol  (BREZTRI  AEROSPHERE) 160-9-4.8 MCG/ACT AERO, Inhale 2 puffs into the lungs in the morning and at bedtime., Disp: 11.8 g, Rfl: 0   cetirizine (ZYRTEC) 10 MG tablet, Take 10 mg by mouth daily. Allergies., Disp: , Rfl:    Cholecalciferol (VITAMIN D ) 50 MCG (2000 UT) CAPS, Take 2,000 Units by mouth., Disp: , Rfl:    clopidogrel  (PLAVIX ) 75 MG tablet, Take 1 tablet (75 mg total) by mouth daily., Disp: 90 tablet, Rfl: 3   cyclobenzaprine  (FLEXERIL ) 10 MG tablet, TAKE ONE TABLET (10 MG TOTAL) BY MOUTH THREE TIMES DAILY AS NEEDED FOR MUSCLE SPASMS., Disp: 90 tablet, Rfl: 0   DULoxetine  (CYMBALTA ) 20 MG capsule, Take 1 capsule (20 mg total) by mouth daily., Disp: 30 capsule, Rfl: 2   EPINEPHrine  0.3 mg/0.3 mL IJ SOAJ injection, Inject 1 mg into the skin as needed., Disp: , Rfl:    furosemide  (LASIX ) 20 MG tablet, Take 1 tablet (20 mg total) by mouth as needed for fluid. For weight gain of 3 to 5 pounds a  week, take 1 tablet for 3 consecutive days., Disp: 60 tablet, Rfl: 3   losartan  (COZAAR ) 100 MG tablet, Take 1 tablet (100 mg total) by mouth daily., Disp: 90 tablet, Rfl: 0   montelukast  (SINGULAIR ) 10 MG tablet, TAKE ONE TABLET BY MOUTH AT BEDTIME, Disp: 90 tablet, Rfl: 3   Multiple Vitamins-Minerals (CENTRUM ADULTS PO), Take 1 tablet by mouth., Disp: , Rfl:    oxymetazoline  (AFRIN) 0.05 % nasal spray, Place 1 spray into both nostrils 2 (two) times daily as needed for congestion. Use for nose bleeds, Disp: , Rfl:    pantoprazole  (PROTONIX ) 40 MG tablet, Take  1 tablet (40 mg total) by mouth daily., Disp: 90 tablet, Rfl: 3   traMADol  (ULTRAM ) 50 MG tablet, Take 1 tablet (50 mg total) by mouth every 12 (twelve) hours as needed for severe pain (pain score 7-10)., Disp: 60 tablet, Rfl: 0        Objective:        BP (!) 144/96 (BP Location: Left Arm, Patient Position: Sitting, Cuff Size: Normal)   Pulse 85   Temp 98.2 F (36.8 C) (Temporal)   Ht 5' 5 (1.651 m)   Wt 178 lb 6.4 oz (80.9 kg)   SpO2 98%   BMI 29.69 kg/m   Physical Exam   Wt Readings from Last 3 Encounters:  07/21/24 178 lb 6.4 oz (80.9 kg)  07/18/24 177 lb (80.3 kg)  07/11/24 177 lb 11.2 oz (80.6 kg)    Physical Exam Constitutional:      General: She is not in acute distress.    Appearance: Normal appearance. She is normal weight. She is not ill-appearing, toxic-appearing or diaphoretic.  HENT:     Head: Normocephalic.  Cardiovascular:     Rate and Rhythm: Normal rate and regular rhythm.  Pulmonary:     Effort: Pulmonary effort is normal.     Breath sounds: Normal breath sounds.  Musculoskeletal:        General: Normal range of motion.  Neurological:     General: No focal deficit present.     Mental Status: She is alert and oriented to person, place, and time. Mental status is at baseline.  Psychiatric:        Mood and Affect: Mood normal.        Behavior: Behavior normal.        Thought Content: Thought content normal.        Judgment: Judgment normal.          Results LABS BNP: 110 (07/10/2024) BNP: 260  RADIOLOGY Chest X-ray: No pleural effusion  Assessment & Plan:   Assessment and Plan Assessment & Plan Congestive heart failure with recent exacerbation Recent exacerbation with elevated BNP levels. Prednisone  was discontinued due to risk of fluid retention exacerbating heart failure. Furosemide  was prescribed to manage fluid overload, which improved symptoms and BNP levels. Cardiologist follow-up confirmed improvement. - Continue  furosemide  as needed for fluid management. - monitor for symptoms of heart failure. - Follow up with cardiology as scheduled  Chronic obstructive pulmonary disease with asthma Ongoing management of COPD with asthma. Previous exacerbation managed with prednisone , which was discontinued due to heart failure concerns. Current symptoms are not well-managed. - Continue current inhaler regimen for COPD and asthma management. - Monitor for any exacerbations or changes in symptoms.  Chronic pain syndrome involving upper back Chronic pain in the upper back, not responsive to epidural injections. Tramadol  prescribed but causes concerns about driving safety. Gabapentin previously caused severe  side effects, including shortness of breath. Considering alternative treatments such as Cymbalta , pending cardiology approval. Discussed potential benefits of Cymbalta  for chronic pain and its non-addictive nature. She is considering referral back to neurosurgeon Dr. Katrina for further evaluation and potential surgical options. - discussed with cardiology and they are agreeable to starting cymbalta . Will start cymbalta  20 mg once daily.  - Communicate with pain management about the issues with tramadol  and explore alternative pain management strategies. - Consider referral back to neurosurgeon Dr. Katrina for further evaluation and potential surgical options.        Return in about 6 months (around 01/18/2025) for f/u CPE.     Ginger Patrick, MSN, APRN, FNP-C [facility]

## 2024-07-24 DIAGNOSIS — J301 Allergic rhinitis due to pollen: Secondary | ICD-10-CM | POA: Diagnosis not present

## 2024-07-25 ENCOUNTER — Telehealth: Payer: Self-pay

## 2024-07-25 ENCOUNTER — Other Ambulatory Visit: Payer: Self-pay

## 2024-07-25 DIAGNOSIS — Z87891 Personal history of nicotine dependence: Secondary | ICD-10-CM

## 2024-07-25 DIAGNOSIS — Z122 Encounter for screening for malignant neoplasm of respiratory organs: Secondary | ICD-10-CM

## 2024-07-25 DIAGNOSIS — R911 Solitary pulmonary nodule: Secondary | ICD-10-CM

## 2024-07-25 NOTE — Telephone Encounter (Signed)
 Called patient to review recent lung CT results. Advised results have been reviewed by Ruthell, NP. PET ordered for slow nodule growth. Pt will f/u with Dr. Tamea in [place] 1-2 weeks later to review results. Appt already scheduled for 10/15, pt aware this may need to be adjusted. She is in agreement to complete a PET. Results and plan to PCP.   IMPRESSION: 1. Lung-RADS 2, benign appearance or behavior. Continue annual screening with low-dose chest CT without contrast in 12 months. Left lower lobe ground-glass nodule is unchanged from most recent exam, but slowly growing, 10 mm mean diameter. No solid components. Attention at follow-up recommended. 2. Coronary artery calcifications. 3. Aortic Atherosclerosis (ICD10-I70.0) and Emphysema (ICD10-J43.9).

## 2024-07-31 DIAGNOSIS — J301 Allergic rhinitis due to pollen: Secondary | ICD-10-CM | POA: Diagnosis not present

## 2024-08-02 ENCOUNTER — Ambulatory Visit
Admission: RE | Admit: 2024-08-02 | Discharge: 2024-08-02 | Disposition: A | Discharge status: Home / Self Care | Source: Ambulatory Visit | Attending: Family | Admitting: Family

## 2024-08-02 DIAGNOSIS — M85851 Other specified disorders of bone density and structure, right thigh: Secondary | ICD-10-CM | POA: Diagnosis not present

## 2024-08-02 DIAGNOSIS — Z780 Asymptomatic menopausal state: Secondary | ICD-10-CM | POA: Insufficient documentation

## 2024-08-02 DIAGNOSIS — M85852 Other specified disorders of bone density and structure, left thigh: Secondary | ICD-10-CM | POA: Diagnosis not present

## 2024-08-07 ENCOUNTER — Ambulatory Visit
Admission: RE | Admit: 2024-08-07 | Discharge: 2024-08-07 | Disposition: A | Discharge status: Home / Self Care | Source: Ambulatory Visit | Attending: Acute Care | Admitting: Acute Care

## 2024-08-07 DIAGNOSIS — I700 Atherosclerosis of aorta: Secondary | ICD-10-CM | POA: Insufficient documentation

## 2024-08-07 DIAGNOSIS — Z87891 Personal history of nicotine dependence: Secondary | ICD-10-CM | POA: Diagnosis not present

## 2024-08-07 DIAGNOSIS — Z923 Personal history of irradiation: Secondary | ICD-10-CM | POA: Diagnosis not present

## 2024-08-07 DIAGNOSIS — Z122 Encounter for screening for malignant neoplasm of respiratory organs: Secondary | ICD-10-CM | POA: Diagnosis not present

## 2024-08-07 DIAGNOSIS — J439 Emphysema, unspecified: Secondary | ICD-10-CM | POA: Diagnosis not present

## 2024-08-07 DIAGNOSIS — I2510 Atherosclerotic heart disease of native coronary artery without angina pectoris: Secondary | ICD-10-CM | POA: Insufficient documentation

## 2024-08-07 DIAGNOSIS — R911 Solitary pulmonary nodule: Secondary | ICD-10-CM | POA: Insufficient documentation

## 2024-08-07 LAB — GLUCOSE, CAPILLARY: Glucose-Capillary: 94 mg/dL (ref 70–99)

## 2024-08-07 MED ORDER — FLUDEOXYGLUCOSE F - 18 (FDG) INJECTION
9.5600 | Freq: Once | INTRAVENOUS | Status: AC | PRN
  Administered 2024-08-07: 9.56 via INTRAVENOUS

## 2024-08-08 ENCOUNTER — Other Ambulatory Visit: Payer: Self-pay

## 2024-08-08 ENCOUNTER — Encounter: Payer: Self-pay | Admitting: Pulmonary Disease

## 2024-08-08 ENCOUNTER — Ambulatory Visit: Admitting: Pulmonary Disease

## 2024-08-08 VITALS — BP 136/82 | HR 80 | Temp 97.6°F | Ht 65.0 in | Wt 177.0 lb

## 2024-08-08 DIAGNOSIS — Z87891 Personal history of nicotine dependence: Secondary | ICD-10-CM

## 2024-08-08 DIAGNOSIS — Z7289 Other problems related to lifestyle: Secondary | ICD-10-CM

## 2024-08-08 DIAGNOSIS — R911 Solitary pulmonary nodule: Secondary | ICD-10-CM | POA: Diagnosis not present

## 2024-08-08 DIAGNOSIS — J449 Chronic obstructive pulmonary disease, unspecified: Secondary | ICD-10-CM | POA: Diagnosis not present

## 2024-08-08 DIAGNOSIS — C300 Malignant neoplasm of nasal cavity: Secondary | ICD-10-CM | POA: Diagnosis not present

## 2024-08-08 NOTE — Patient Instructions (Signed)
 VISIT SUMMARY:  During your visit, we discussed your recent episodes of difficulty breathing and wheezing, likely triggered by hot and humid weather conditions during your trip to [facility]. We also reviewed your ongoing treatment for COPD, the surveillance of a solitary pulmonary nodule in your left lung, and your history of nasal adenocarcinoma.  YOUR PLAN:  -CHRONIC OBSTRUCTIVE PULMONARY DISEASE (COPD): COPD is a chronic lung condition that makes it hard to breathe. You experienced episodes of difficulty breathing and wheezing during your trip, which were relieved with albuterol . Continue using Breztri  as prescribed and use albuterol  as needed for acute symptoms.  -SOLITARY PULMONARY NODULE, LEFT LUNG, UNDER SURVEILLANCE: A solitary pulmonary nodule is a small growth in the lung. Your recent PET scan showed no significant activity, but we will monitor it closely due to its slow growth potential. A follow-up CT scan is planned in 6 months, and we will consider a biopsy if future scans show changes.  -HISTORY OF NASAL ADENOCARCINOMA, STATUS POST RADIATION: Nasal adenocarcinoma is a type of cancer that was treated with surgery and radiation in 2016. Your regular follow-ups with ENT have shown no recurrence, and the current pulmonary nodule surveillance is not directly related to your past adenocarcinoma.  INSTRUCTIONS:  Please continue using Breztri  as prescribed and albuterol  as needed for acute symptoms. We will schedule a follow-up CT scan in 6 months to monitor the pulmonary nodule in your left lung. If there are any changes in future scans, we may consider a biopsy. Keep up with your regular ENT follow-ups for nasal adenocarcinoma surveillance.

## 2024-08-08 NOTE — Progress Notes (Signed)
 Subjective:    Patient ID: Heather Beasley, female    DOB: 24-Apr-1956, 68 y.o.   MRN: [id number]  Patient Care Team: Corwin Antu, FNP as PCP - General (Family Medicine) Verlin Lonni BIRCH, MD as PCP - Cardiology (Cardiology) Lenn Aran, MD as Referring Physician (Radiation Oncology) Herminio Miu, MD (Otolaryngology) Clois Fret, MD as Consulting Physician (Neurosurgery) Delores Orvin BRAVO, NP as Nurse Practitioner (Vascular Surgery) [place], [facility] Lavonia Lye, MD as Consulting Physician (Ophthalmology) Tamea Dedra CROME, MD as Consulting Physician (Pulmonary Disease)  Chief Complaint  Patient presents with   COPD    Cough with phlegm, first thing in the morning. Had two asthma attacks, during the summer.     BACKGROUND/INTERVAL:Heather Beasley is a 68 year old former cigarette smoker (quit 2/24, 39 PY) with a history as noted below, who presents for follow-up on the issue of dyspnea and COPD.  Patient was last seen on 06 April 2024 at that time she was instructed to continue Breztri .  No exacerbations since her last visit.   HPI Discussed the use of AI scribe software for clinical note transcription with the patient, who gave verbal consent to proceed.  History of Present Illness   Heather Beasley is a 68 year old female with COPD who presents for follow-up.  She experienced two episodes of dyspnea and wheezing, resembling asthma attacks, while on a trip to [facility] in August. The first episode occurred after shopping at [employer], and the second while standing on a deck near the beach. Both episodes were characterized by difficulty breathing, which she managed with two puffs of albuterol  each time, providing relief. She attributes these episodes to the hot and humid weather conditions at the time. No similar symptoms have occurred since returning home.  She reports using Breztri  in the morning and at night, and albuterol  as needed. She did not  require albuterol  on days without symptoms. Her albuterol  prescription was recently refilled as it was nearing expiration.  In 2016, she was diagnosed with adenocarcinoma of a nasal polyp, which was treated with surgical removal and over forty radiation treatments. She continues to have regular follow-ups every six months with nasal scopes to ensure there is no recurrence, and reports that everything has been clear.  She mentions a recent cardiac scan conducted about a month ago to evaluate for congestive heart failure, which was performed at an outpatient facility in [place]. She recalls being advised to take medication as needed, but does not specify the medication name or dosage.   She had lung cancer screening CT on 17 July 2024 showing slow growth in the left lower lobe ground glass nodule exhibiting no solid components.  She underwent PET/CT yesterday that shows no hypermetabolic correlate on PET/CT.  No evidence of hypermetabolic metastatic disease from remote head and neck primary.    DATA 01/14/2022 CT chest lung cancer screening: Lung RADS 2, 8 mm groundglass nodule left lower lobe, emphysema, coronary calcifications noted 01/21/2022 echocardiogram: LVEF 70 to 75%, hyperdynamic LV, no wall motion abnormalities, moderate ASH and grade 1 DD.  Mild mitral valve regurgitation 01/26/2022 coronary CTA: Minimal coronary artery disease, PFO with left-to-right contrast flow 03/05/2022 PFTs: FEV1 1.60 L or 62% predicted, FVC 2.52 L or 75% predicted, FEV1/FVC 64%, no bronchodilator response, lung volumes normal.  Diffusion capacity moderately reduced, consistent with moderately severe obstructive airways disease, moderate diffusion impairment, probable concomitant restriction likely on the basis of obesity 05/01/2022 sleep study (in lab): No indication of sleep  disordered breathing.  Consider other etiologies of somnolence. 01/15/2023 CT chest lung cancer screening: Lung RADS 2, 8 mm groundglass  nodule on the left lower lobe unchanged, emphysema, coronary calcifications. 07/17/2024 LDCT chest: Left lower lobe ground glass nodule slowly growing with 10 mm mean diameter noted no solid components. 08/07/2024 PET/CT: No hypermetabolic activity on left lower lobe lung nodule, low-grade adenocarcinoma remains a consideration, no evidence of hypermetabolic metastatic disease from the patient's remote head and neck primary.  Review of Systems A 10 point review of systems was performed and it is as noted above otherwise negative.   Patient Active Problem List   Diagnosis Date Noted   Hyperkalemia 06/21/2024   Shortness of breath at rest 06/21/2024   Disseminated herpes zoster 06/21/2024   Current every day vaping 06/21/2024   BMI 29.0-29.9,adult 06/21/2024   Prediabetes 03/16/2024   Radicular pain in right arm 12/14/2023   S/P cervical spinal fusion (c3-6) 12/14/2023   Obesity (BMI 30-39.9) 08/25/2023   Nodule of left palm 08/25/2023   CVD (cardiovascular disease) 08/25/2023   Coronary artery disease involving native coronary artery of native heart without angina pectoris 08/25/2023   Nonrheumatic mitral valve regurgitation 08/25/2023   Status post lumbar spinal fusion 03/08/2023   Lumbar adjacent segment disease with spondylolisthesis 03/01/2023   S/P lumbar fusion 03/01/2023   Lumbar spinal stenosis due to adjacent segment disease after fusion procedure (L4-S1 fusion, Left L3/4) 11/11/2022   Lumbar post-laminectomy syndrome 11/11/2022   Chronic pain syndrome 11/11/2022   Sacroiliac joint pain 11/11/2022   Lumbar radiculopathy 07/17/2022   History of left foot drop 07/17/2022   Vitamin B12 deficiency 04/14/2022   Snoring 01/20/2022   Elevated brain natriuretic peptide (BNP) level 01/08/2022   Pulmonary emphysema (HCC) 01/08/2022   Pulmonary nodule less than 6 mm determined by computed tomography of lung 01/08/2022   Family history of breast cancer in female 11/17/2021   Carotid  stenosis, left 06/27/2020   Primary osteoarthritis involving multiple joints 03/01/2018   Osteopenia 02/14/2018   HLD (hyperlipidemia) 12/01/2016   Irritable bowel syndrome with diarrhea 12/01/2016   History of cancer of nasal cavity 07/07/2015   Essential hypertension, benign 08/08/2013   Mixed urge and stress incontinence 03/27/2013   Allergic rhinitis 12/13/2012   Chronic headaches 02/22/2012   GERD (gastroesophageal reflux disease) 02/22/2012   Vitamin D  deficiency 01/04/2012    Social History   Tobacco Use   Smoking status: Every Day    Types: E-cigarettes   Smokeless tobacco: Never   Tobacco comments:    Using vape with no nicotine    Substance Use Topics   Alcohol use: Not Currently    Allergies  Allergen Reactions   Albumin (Human) Rash   Amoxicillin-Pot Clavulanate Hives   Gabapentin Shortness Of Breath   Latex Dermatitis and Rash   Betadine [Povidone Iodine ] Dermatitis, Rash and Other (See Comments)    Skin burning Topical betadine and iodine  have this reaction with patient.  IV contrast is NOT a problem.  jkl   Sucralfate     Other reaction(s): Other (See Comments) Abdominal Pain   Amoxicillin     REACTION: rash   Chantix [Varenicline Tartrate]     Heart racing   Erythromycin     REACTION: rash   Sucralfate Nausea And Vomiting    Heart racing Lightheaded    Tegaderm Ag Mesh [Silver] Hives    Mild     Current Meds  Medication Sig   acetaminophen  (TYLENOL ) 650 MG CR tablet Take  650 mg by mouth every 8 (eight) hours as needed for pain.   albuterol  (VENTOLIN  HFA) 108 (90 Base) MCG/ACT inhaler Inhale 1-2 puffs into the lungs every 6 (six) hours as needed (for cough).   atorvastatin  (LIPITOR) 20 MG tablet TAKE ONE TABLET BY MOUTH ONCE A DAY   benzonatate  (TESSALON ) 200 MG capsule Take 200 mg by mouth. nightly   Budeson-Glycopyrrol-Formoterol  (BREZTRI  AEROSPHERE) 160-9-4.8 MCG/ACT AERO Inhale 2 puffs into the lungs in the morning and at bedtime.    cetirizine (ZYRTEC) 10 MG tablet Take 10 mg by mouth daily. Allergies.   Cholecalciferol (VITAMIN D ) 50 MCG (2000 UT) CAPS Take 2,000 Units by mouth.   clopidogrel  (PLAVIX ) 75 MG tablet Take 1 tablet (75 mg total) by mouth daily.   cyclobenzaprine  (FLEXERIL ) 10 MG tablet TAKE ONE TABLET (10 MG TOTAL) BY MOUTH THREE TIMES DAILY AS NEEDED FOR MUSCLE SPASMS.   DULoxetine  (CYMBALTA ) 20 MG capsule Take 1 capsule (20 mg total) by mouth daily.   EPINEPHrine  0.3 mg/0.3 mL IJ SOAJ injection Inject 1 mg into the skin as needed.   furosemide  (LASIX ) 20 MG tablet Take 1 tablet (20 mg total) by mouth as needed for fluid. For weight gain of 3 to 5 pounds a week, take 1 tablet for 3 consecutive days.   losartan  (COZAAR ) 100 MG tablet Take 1 tablet (100 mg total) by mouth daily.   montelukast  (SINGULAIR ) 10 MG tablet TAKE ONE TABLET BY MOUTH AT BEDTIME   Multiple Vitamins-Minerals (CENTRUM ADULTS PO) Take 1 tablet by mouth.   oxymetazoline  (AFRIN) 0.05 % nasal spray Place 1 spray into both nostrils 2 (two) times daily as needed for congestion. Use for nose bleeds   pantoprazole  (PROTONIX ) 40 MG tablet Take 1 tablet (40 mg total) by mouth daily.   traMADol  (ULTRAM ) 50 MG tablet Take 1 tablet (50 mg total) by mouth every 12 (twelve) hours as needed for severe pain (pain score 7-10).    Immunization History  Administered Date(s) Administered   INFLUENZA, HIGH DOSE SEASONAL PF 07/13/2024   Influenza Split 07/27/2011, 08/25/2012   Influenza Whole 09/14/2007   Influenza,inj,Quad PF,6+ Mos 08/10/2013, 08/01/2015, 07/16/2016, 08/02/2018, 07/15/2019   Influenza,inj,quad, With Preservative 08/13/2017   Influenza-Unspecified 07/26/2021, 08/05/2022, 07/29/2023   PNEUMOCOCCAL CONJUGATE-20 08/18/2023   Pneumococcal Conjugate-13 07/16/2016   Pneumococcal Polysaccharide-23 11/27/2011, 03/01/2018   Respiratory Syncytial Virus Vaccine,Recomb Aduvanted(Arexvy) 08/26/2022   Td 04/04/2024        Objective:     BP  136/82   Pulse 80   Temp 97.6 F (36.4 C) (Temporal)   Ht 5' 5 (1.651 m)   Wt 177 lb (80.3 kg)   SpO2 96%   BMI 29.45 kg/m   SpO2: 96 %  GENERAL: Overweight woman, no acute distress, fully ambulatory.  No conversational dyspnea.  Nasal quality to the speech. HEAD: Normocephalic, atraumatic.  EYES: Pupils equal, round, reactive to light.  No scleral icterus.  MOUTH: Wears dentures upper and lower, class III airway.  No thrush. NECK: Supple. No thyromegaly. Trachea midline. No JVD.  No adenopathy. PULMONARY: Good air entry bilaterally.  Very faint end expiratory wheezes. CARDIOVASCULAR: S1 and S2. Regular rate and rhythm.  Grade 2/6 systolic ejection murmur. No gallop. ABDOMEN: Benign. MUSCULOSKELETAL: No joint deformity, no clubbing, no edema.  NEUROLOGIC: Chronic left foot drop.  This affects gait.  No other localizing finding. SKIN: Intact,warm,dry.  Nails are short (patient is a nail biter). PSYCH: Mood and behavior normal.    Representative image from CT performed  17 July 2024 showing the left lower lobe nodule in question now 10 mm (arrow):       Assessment & Plan:     ICD-10-CM   1. Stage 2 moderate COPD by GOLD classification (HCC)  J44.9     2. Lung nodule  R91.1     3. Primary adenocarcinoma of nasal cavity (HCC)  C30.0     4. Vapes non-nicotine  containing substance  Z72.89     5. Personal history of tobacco use, presenting hazards to health  Z87.891      Discussion:    Chronic obstructive pulmonary disease (COPD) Recent episodes of dyspnea and wheezing during a trip to the beach, likely triggered by hot and humid conditions. Symptoms were relieved with albuterol  use. No further episodes since returning home. Continues to use Breztri  regularly. - Continue Breztri  as prescribed - Use albuterol  as needed for acute symptoms  Solitary pulmonary nodule, left lung, under surveillance Small nodule in the left lung with minimal solid component. Recent PET  scan shows no significant activity. Plan to monitor due to slow growth potential.  A 82-month follow-up CT scan is planned for closer monitoring. The nodule's slow growth suggests it may not pose an immediate threat. - Order follow-up CT scan in 6 months through the lung cancer screening program - Consider biopsy if future scans show changes  History of nasal adenocarcinoma, status post radiation Treated with radiation in 2016. Regular follow-ups with ENT show no recurrence.  Recent PET/CT did not show any evidence of recurrence.   Will see the patient in 6 months time after CT scan of the chest.    Advised if symptoms do not improve or worsen, to please contact office for sooner follow up or seek emergency care.    I spent 35 minutes of dedicated to the care of this patient on the date of this encounter to include pre-visit review of records, face-to-face time with the patient discussing conditions above, post visit ordering of testing, clinical documentation with the electronic health record, making appropriate referrals as documented, and communicating necessary findings to members of the patients care team.     C. Leita Sanders, MD Advanced Bronchoscopy PCCM [facility]    *This note was generated using voice recognition software/Dragon and/or AI transcription program.  Despite best efforts to proofread, errors can occur which can change the meaning. Any transcriptional errors that result from this process are unintentional and may not be fully corrected at the time of dictation.

## 2024-08-09 ENCOUNTER — Ambulatory Visit: Admitting: Pulmonary Disease

## 2024-08-14 ENCOUNTER — Other Ambulatory Visit: Payer: Self-pay | Admitting: Physician Assistant

## 2024-08-14 DIAGNOSIS — J301 Allergic rhinitis due to pollen: Secondary | ICD-10-CM | POA: Diagnosis not present

## 2024-08-17 ENCOUNTER — Encounter: Payer: Self-pay | Admitting: Nurse Practitioner

## 2024-08-17 ENCOUNTER — Ambulatory Visit: Attending: Nurse Practitioner | Admitting: Nurse Practitioner

## 2024-08-17 VITALS — BP 155/86 | HR 83 | Temp 97.7°F | Resp 16 | Ht 64.0 in | Wt 177.0 lb

## 2024-08-17 DIAGNOSIS — M5416 Radiculopathy, lumbar region: Secondary | ICD-10-CM | POA: Insufficient documentation

## 2024-08-17 DIAGNOSIS — G894 Chronic pain syndrome: Secondary | ICD-10-CM | POA: Diagnosis not present

## 2024-08-17 DIAGNOSIS — M792 Neuralgia and neuritis, unspecified: Secondary | ICD-10-CM | POA: Insufficient documentation

## 2024-08-17 DIAGNOSIS — M5412 Radiculopathy, cervical region: Secondary | ICD-10-CM | POA: Insufficient documentation

## 2024-08-17 DIAGNOSIS — M961 Postlaminectomy syndrome, not elsewhere classified: Secondary | ICD-10-CM | POA: Insufficient documentation

## 2024-08-17 DIAGNOSIS — Z79899 Other long term (current) drug therapy: Secondary | ICD-10-CM | POA: Diagnosis not present

## 2024-08-17 DIAGNOSIS — G8929 Other chronic pain: Secondary | ICD-10-CM | POA: Diagnosis not present

## 2024-08-17 MED ORDER — TRAMADOL HCL 50 MG PO TABS: 50.0000 mg | ORAL_TABLET | Freq: Every day | ORAL | 0 refills | Status: AC | PRN

## 2024-08-17 NOTE — Progress Notes (Signed)
 PROVIDER NOTE: Interpretation of information contained herein should be left to medically-trained personnel. Specific patient instructions are provided elsewhere under Patient Instructions section of medical record. This document was created in part using AI and STT-dictation technology, any transcriptional errors that may result from this process are unintentional.  Patient: Heather Beasley  Service: E/M   PCP: Corwin Antu, FNP  DOB: 1956/07/23  DOS: 08/17/2024  Provider: Emmy MARLA Blanch, NP  MRN: [id number]  Delivery: Face-to-face  Specialty: Interventional Pain Management  Type: Established Patient  Setting: Ambulatory outpatient facility  Specialty designation: 09  Referring Prov.: Simmons-Robinson, Makie*  Location: Outpatient office facility       History of present illness (HPI) Heather Beasley, a 68 y.o. year old female, is here today because of her back pain (thoracic medial). Heather Beasley primary complain today is Back Pain (Thoracic medial )  Pertinent problems: Heather Beasley has  Vitamin D  deficiency; Chronic headaches; Osteopenia; Primary osteoarthritis involving multiple joints; Carotid stenosis, left; Pulmonary emphysema (HCC); Pulmonary nodule less than 6 mm determined by computed tomography of lung; Pain in limb; Lumbar radiculopathy; History of left foot drop; Accidental fall; Lumbar spinal stenosis due to adjacent segment disease after fusion procedure (L4-S1 fusion, Left L3/4); Lumbar post-laminectomy syndrome; Chronic pain syndrome; and Sacroiliac joint pain on their pertinent problem list.   Pain Assessment: Severity of Chronic pain is reported as a 3 /10. Location: Back Mid/Wraps around to bilateral rib cage into sternum. Onset: More than a month ago. Quality: Aching, Constant, Dull, Discomfort, Stabbing. Timing: Constant. Modifying factor(s): Tramadol . rest, and laying down. Vitals:  height is 5' 4 (1.626 m) and weight is 177 lb (80.3 kg). Her temporal temperature is 97.7 F  (36.5 C). Her blood pressure is 155/86 (abnormal) and her pulse is 83. Her respiration is 16 and oxygen saturation is 98%.  BMI: Estimated body mass index is 30.38 kg/m as calculated from the following:   Height as of this encounter: 5' 4 (1.626 m).   Weight as of this encounter: 177 lb (80.3 kg).  Last encounter: 07/18/2024. Last procedure: Visit date not found.  Reason for encounter: medication management. No change in medical history since last visit.  Patient's pain is at baseline.  Patient continues multimodal pain regimen as prescribed.  States that it provides pain relief and improvement in functional status.   Discussed the use of AI scribe software for clinical note transcription with the patient, who gave verbal consent to proceed.  History of Present Illness   Heather Beasley is a 68 year old female who presents for pain management follow-up. She has been managing her chronic pain with tramadol . Initially, she took a full 50 mg pill twice daily but experienced difficulty waking up in the morning due to drowsiness. Consequently, she adjusted her dosage to half a pill (25 mg) at night only, which has effectively managed her pain without causing morning drowsiness. She has been on this adjusted regimen for the past two weeks. She mentions having enough medication to last for about a month when cutting the pills in half. Although she did not bring her pill bottle to the appointment. She takes her grandchildren to school, which is about two miles from her house.     Pharmacotherapy Assessment   Started tramadol  (Ultram ) 50 mg every daily at bedtime as needed for pain. MME=20 Monitoring: [place] PMP: PDMP reviewed during this encounter.       Pharmacotherapy: No side-effects or adverse reactions reported. Compliance: No  problems identified. Effectiveness: Clinically acceptable.  Bonner Norris, RN  08/17/2024 10:11 AM  Sign when Signing Visit Nursing Pain Medication Assessment:  Safety  precautions to be maintained throughout the outpatient stay will include: orient to surroundings, keep bed in low position, maintain call bell within reach at all times, provide assistance with transfer out of bed and ambulation.  Medication Inspection Compliance: Heather Beasley did not comply with our request to bring her pills to be counted. She was reminded that bringing the medication bottles, even when empty, is a requirement.  Medication: None brought in. Pill/Patch Count: None available to be counted. Bottle Appearance: No container available. Did not bring bottle(s) to appointment. Filled Date: N/A Last Medication intake:  Yesterday        UDS:  Summary  Date Value Ref Range Status  05/15/2024 FINAL  Final    Comment:    ==================================================================== Compliance Drug Analysis, Ur ==================================================================== Test                             Result       Flag       Units  Drug Present and Declared for Prescription Verification   Desmethylcyclobenzaprine       PRESENT      EXPECTED    Desmethylcyclobenzaprine is an expected metabolite of    cyclobenzaprine .    Acetaminophen                   PRESENT      EXPECTED ==================================================================== Test                      Result    Flag   Units      Ref Range   Creatinine              65               mg/dL      >=79 ==================================================================== Declared Medications:  The flagging and interpretation on this report are based on the  following declared medications.  Unexpected results may arise from  inaccuracies in the declared medications.   **Note: The testing scope of this panel includes these medications:   Cyclobenzaprine  (Flexeril )   **Note: The testing scope of this panel does not include small to  moderate amounts of these reported medications:   Acetaminophen   (Tylenol )   **Note: The testing scope of this panel does not include the  following reported medications:   Albuterol  (Ventolin  HFA)  Atorvastatin  (Lipitor)  Budesonide (Breztri  Aerosphere)  Cephalexin  (Keflex )  Cetirizine (Zyrtec)  Clopidogrel  (Plavix )  Epinephrine  (EpiPen )  Formoterol  (Breztri  Aerosphere)  Furosemide  (Lasix )  Glycopyrrolate  (Breztri  Aerosphere)  Losartan  (Cozaar )  Montelukast  (Singulair )  Multivitamin  Oxymetazoline  (Afrin)  Pantoprazole  (Protonix )  Terbinafine  (Lamisil )  Vitamin D  ==================================================================== For clinical consultation, please call [phone]. ====================================================================     No results found for: CBDTHCR No results found for: D8THCCBX No results found for: D9THCCBX  ROS  Constitutional: Denies any fever or chills Gastrointestinal: No reported hemesis, hematochezia, vomiting, or acute GI distress Musculoskeletal: Back pain (thoracic medial) Neurological: No reported episodes of acute onset apraxia, aphasia, dysarthria, agnosia, amnesia, paralysis, loss of coordination, or loss of consciousness  Medication Review  DULoxetine , EPINEPHrine , Multiple Vitamins-Minerals, Vitamin D , acetaminophen , albuterol , atorvastatin , benzonatate , budesonide-glycopyrrolate -formoterol , cetirizine, clopidogrel , cyclobenzaprine , furosemide , losartan , montelukast , oxymetazoline , pantoprazole , and traMADol   History Review  Allergy: Heather Beasley is allergic to albumin (human), amoxicillin-pot clavulanate, gabapentin,  latex, betadine [povidone iodine ], sucralfate, amoxicillin, chantix [varenicline tartrate], erythromycin, sucralfate, and tegaderm ag mesh [silver]. Drug: Heather Beasley  reports no history of drug use. Alcohol:  reports that she does not currently use alcohol. Tobacco:  reports that she has been smoking e-cigarettes. She has never used smokeless tobacco. Social:  Heather Beasley  reports that she has been smoking e-cigarettes. She has never used smokeless tobacco. She reports that she does not currently use alcohol. She reports that she does not use drugs. Medical:  has a past medical history of Allergic rhinitis, Aortic atherosclerosis, Arthritis, Asthma, Bilateral carotid artery disease, Cancer (HCC), Cancer of nasal cavity and sinus (HCC) (07/07/2015), Cervical spondylosis, Colitis, Colon polyps, COPD (chronic obstructive pulmonary disease) (HCC), Coronary artery disease, DDD (degenerative disc disease), cervical, Depression, Diastolic dysfunction, Diverticulosis, GERD (gastroesophageal reflux disease), Glaucoma, Headaches, cluster, Heart murmur, HTN (hypertension), Hyperlipidemia, IBS (irritable bowel syndrome), Kidney stones, Long term current use of antithrombotics/antiplatelets, Lumbar adjacent segment disease with spondylolisthesis, Melanoma (HCC) (1995), Neuropathy, Neuropathy of left lower extremity, Nose colonized with MRSA (02/16/2023), PFO (patent foramen ovale), Pneumonia, PONV (postoperative nausea and vomiting), and Tobacco abuse. Surgical: Heather Beasley  has a past surgical history that includes Appendectomy (1998); Abdominal hysterectomy (1998); Lumbar spine surgery (07/2004); Vaginal delivery; Cervical fusion (2010); Cholecystectomy (08/2011); Esophagogastroduodenoscopy (2012); Colonoscopy (N/A); Tonsillectomy (1968); Cystoscopy (N/A); Back surgery; Dilation and curettage of uterus; Fracture surgery (2001); Image guided sinus surgery (N/A, 06/25/2015); Polypectomy (N/A, 06/25/2015); Oophorectomy; CAROTID PTA/STENT INTERVENTION (Left, 06/27/2020); Cataract extraction; Anterior lumbar fusion (N/A, 03/01/2023); and Application of intraoperative CT scan (N/A, 03/01/2023). Family: family history includes Breast cancer (age of onset: 32) in her paternal grandmother; Cancer in her father, mother, and paternal grandmother; Colon cancer in her father; Coronary artery  disease in her paternal grandmother; Diabetes in her mother and paternal grandmother; Heart failure in her paternal grandmother; Hyperlipidemia in her mother; Hypertension in her mother; Pancreatic cancer in her father; Stroke in her brother.  Laboratory Chemistry Profile   Renal Lab Results  Component Value Date   BUN 13 06/21/2024   CREATININE 0.95 06/21/2024   BCR 16 01/12/2023   GFR 62.65 06/21/2024   GFRAA >60 06/28/2020   GFRNONAA >60 12/31/2021    Hepatic Lab Results  Component Value Date   AST 20 06/21/2024   ALT 13 06/21/2024   ALBUMIN 4.2 06/21/2024   ALKPHOS 83 06/21/2024   LIPASE 16 01/04/2012    Electrolytes Lab Results  Component Value Date   NA 143 06/21/2024   K 4.6 06/21/2024   CL 103 06/21/2024   CALCIUM  9.4 06/21/2024   MG 1.8 06/21/2024    Bone Lab Results  Component Value Date   VD25OH 43.63 08/25/2023    Inflammation (CRP: Acute Phase) (ESR: Chronic Phase) Lab Results  Component Value Date   ESRSEDRATE 2 11/17/2012   LATICACIDVEN 1.0 06/11/2011         Note: Above Lab results reviewed.  Recent Imaging Review  NM PET Image Initial (PI) Skull Base To Thigh CLINICAL DATA:  Initial treatment strategy for left lower lobe ground-glass nodule on lung cancer screening CTs. History of adenocarcinoma of right nasal cavity, status post radiation therapy in 2016.  EXAM: NUCLEAR MEDICINE PET SKULL BASE TO THIGH  TECHNIQUE: 9.6 mCi F-18 FDG was injected intravenously. Full-ring PET imaging was performed from the skull base to thigh after the radiotracer. CT data was obtained and used for attenuation correction and anatomic localization.  Fasting blood glucose: 94 mg/dl  COMPARISON:  PET of  01/12/2017.  Chest CTs most recent 07/17/2024.  FINDINGS: Mediastinal blood pool activity: SUV max 2.4  Liver activity: SUV max NA  NECK: No cervical nodal hypermetabolism. No evidence of nasopharyngeal recurrence.  Incidental CT findings: Mucosal  thickening of sphenoid sinus is new. No cervical adenopathy. Left carotid stent.  CHEST: The left lower lobe ground-glass nodule is not hypermetabolic, including at 9 mm on 69/6. No thoracic nodal hypermetabolism.  Incidental CT findings: Aortic atherosclerosis. Lad coronary artery calcification. Mild centrilobular emphysema.  ABDOMEN/PELVIS: No abdominopelvic parenchymal or nodal hypermetabolism.  Incidental CT findings: Cholecystectomy. Upper pole right renal 1.0 cm low-density lesion is likely a cyst . In the absence of clinically indicated signs/symptoms require(s) no independent follow-up. Abdominal aortic atherosclerosis. Scattered colonic diverticula. Hysterectomy. Mild pelvic floor laxity.  SKELETON: No abnormal marrow activity.  Incidental CT findings: Lumbosacral spine fixation. Cervical spine fixation.  IMPRESSION: 1. The left lower lobe ground-glass nodule is without hypermetabolic correlate. Given ground-glass morphology and lesion size, this would not be expected to be hypermetabolic. Therefore, low-grade adenocarcinoma remains a consideration and CT surveillance recommended. 2. No evidence of hypermetabolic metastatic disease from patient's remote head and neck primary. 3. Incidental findings, including: Aortic atherosclerosis (ICD10-I70.0), coronary artery atherosclerosis and emphysema (ICD10-J43.9). Sinus disease.  Electronically Signed   By: Rockey Kilts M.D.   On: 08/08/2024 10:48 Note: Reviewed        Physical Exam  Vitals: BP (!) 155/86 (Patient Position: Sitting, Cuff Size: Normal)   Pulse 83   Temp 97.7 F (36.5 C) (Temporal)   Resp 16   Ht 5' 4 (1.626 m)   Wt 177 lb (80.3 kg)   SpO2 98%   BMI 30.38 kg/m  BMI: Estimated body mass index is 30.38 kg/m as calculated from the following:   Height as of this encounter: 5' 4 (1.626 m).   Weight as of this encounter: 177 lb (80.3 kg). Ideal: Ideal body weight: 54.7 kg (120 lb 9.5 oz) Adjusted  ideal body weight: 64.9 kg (143 lb 2.5 oz) General appearance: Well nourished, well developed, and well hydrated. In no apparent acute distress Mental status: Alert, oriented x 3 (person, place, & time)       Respiratory: No evidence of acute respiratory distress Eyes: PERLA  Musculoskeletal: + Back pain (thoracic medial) Assessment   Diagnosis Status  1. Chronic pain syndrome   2. Cervical radicular pain   3. Chronic radicular lumbar pain   4. Radicular pain in right arm   5. Lumbar post-laminectomy syndrome   6. Medication management    Controlled Controlled Controlled   Updated Problems: No problems updated.  Plan of Care  Problem-specific:  Assessment and Plan    Chronic pain requiring tramadol  Chronic pain managed with tramadol  25 mg at night, effective without causing drowsiness. - Prescribe tramadol  50 mg, take 25 mg at bedtime. - Provide 30-day supply, lasting 60 days. - Schedule follow-up in 2-3 months to reassess.  Patient's pain is well-controlled with tramadol , will continue on current medication regimen.  Prescribing drug monitoring (PMP) reviewed, findings consistent with the use of prescribed medication and no evidence of narcotic misuse or abuse.  Urine drug screening (UDS) up to date.  The patient was advised to bring pill bottle at each visit for pill count in order to continue with pain management.  The patient was also advised to drink more water  to prevent opioid induced constipation.  Schedule follow-up in 60 days for medication management.  Cervical radicular pain: Patient continuously manage  her cervical radicular component with medication management.  She does not want any interventional therapy at this time. Medications   traMADol  (ULTRAM ) 50 MG tablet    Sig: Take 1 tablet (50 mg total) by mouth daily as needed for severe pain (pain score 7-10).    Dispense:  30 tablet    Refill:  0    Fill one day early if pharmacy is closed on scheduled refill  date. Do not fill until: To last until:   Chronic radicular lumbar pain: Medications   traMADol  (ULTRAM ) 50 MG tablet    Sig: Take 1 tablet (50 mg total) by mouth daily as needed for severe pain (pain score 7-10).    Dispense:  30 tablet    Refill:  0    Fill one day early if pharmacy is closed on scheduled refill date. Do not fill until: To last until:        Heather Beasley has a current medication list which includes the following long-term medication(s): albuterol , atorvastatin , duloxetine , furosemide , losartan , montelukast , and pantoprazole .  Pharmacotherapy (Medications Ordered): Meds ordered this encounter  Medications   traMADol  (ULTRAM ) 50 MG tablet    Sig: Take 1 tablet (50 mg total) by mouth daily as needed for severe pain (pain score 7-10).    Dispense:  30 tablet    Refill:  0    Fill one day early if pharmacy is closed on scheduled refill date. Do not fill until: To last until:   Orders:  No orders of the defined types were placed in this encounter.       Return in about 2 months (around 10/17/2024) for (F2F), (MM), Emmy Blanch NP.    Recent Visits Date Type Provider Dept  07/18/24 Office Visit Lynnea Vandervoort K, NP [facility]  05/30/24 Office Visit Marcelino Nurse, MD [facility]  Showing recent visits within past 90 days and meeting all other requirements Today's Visits Date Type Provider Dept  08/17/24 Office Visit Courtlynn Holloman K, NP [facility]  Showing today's visits and meeting all other requirements Future Appointments Date Type Provider Dept  10/10/24 Appointment Sharayah Renfrow K, NP [facility]  Showing future appointments within next 90 days and meeting all other requirements  I discussed the assessment and treatment plan with the patient. The patient was provided an opportunity to ask questions and all were answered. The patient agreed with the plan and demonstrated an understanding of the  instructions.  Patient advised to call back or seek an in-person evaluation if the symptoms or condition worsens.  I personally spent a total of 30 minutes in the care of the patient today including preparing to see the patient, getting/reviewing separately obtained history, performing a medically appropriate exam/evaluation, counseling and educating, placing orders, referring and communicating with other health care professionals, documenting clinical information in the EHR, independently interpreting results, communicating results, and coordinating care.   Note by: Aanshi Batchelder K Ryshawn Sanzone, NP (TTS and AI technology used. I apologize for any typographical errors that were not detected and corrected.) Date: 08/17/2024; Time: 12:44 PM

## 2024-08-17 NOTE — Progress Notes (Signed)
 Nursing Pain Medication Assessment:  Safety precautions to be maintained throughout the outpatient stay will include: orient to surroundings, keep bed in low position, maintain call bell within reach at all times, provide assistance with transfer out of bed and ambulation.  Medication Inspection Compliance: Heather Beasley did not comply with our request to bring her pills to be counted. She was reminded that bringing the medication bottles, even when empty, is a requirement.  Medication: None brought in. Pill/Patch Count: None available to be counted. Bottle Appearance: No container available. Did not bring bottle(s) to appointment. Filled Date: N/A Last Medication intake:  Yesterday

## 2024-08-21 ENCOUNTER — Ambulatory Visit: Attending: Physician Assistant

## 2024-08-21 DIAGNOSIS — I340 Nonrheumatic mitral (valve) insufficiency: Secondary | ICD-10-CM

## 2024-08-21 DIAGNOSIS — J301 Allergic rhinitis due to pollen: Secondary | ICD-10-CM | POA: Diagnosis not present

## 2024-08-21 LAB — ECHOCARDIOGRAM COMPLETE
AR max vel: 2.86 cm2
AV Area VTI: 3.02 cm2
AV Area mean vel: 2.95 cm2
AV Mean grad: 5 mmHg
AV Peak grad: 9.6 mmHg
Ao pk vel: 1.55 m/s
Area-P 1/2: 3.23 cm2
S' Lateral: 2.31 cm

## 2024-08-21 MED ORDER — PERFLUTREN LIPID MICROSPHERE
1.0000 mL | INTRAVENOUS | Status: AC | PRN
  Administered 2024-08-21: 3 mL via INTRAVENOUS

## 2024-08-28 ENCOUNTER — Encounter: Payer: Self-pay | Admitting: Radiology

## 2024-08-28 DIAGNOSIS — J301 Allergic rhinitis due to pollen: Secondary | ICD-10-CM | POA: Diagnosis not present

## 2024-08-30 ENCOUNTER — Telehealth: Payer: Self-pay | Admitting: Cardiovascular Disease

## 2024-08-30 NOTE — Telephone Encounter (Signed)
 Patient is returning call for results. Please advise

## 2024-08-31 NOTE — Telephone Encounter (Signed)
 Patient is following up.

## 2024-09-04 ENCOUNTER — Other Ambulatory Visit: Payer: Self-pay | Admitting: Family

## 2024-09-04 DIAGNOSIS — E7800 Pure hypercholesterolemia, unspecified: Secondary | ICD-10-CM

## 2024-09-04 DIAGNOSIS — J301 Allergic rhinitis due to pollen: Secondary | ICD-10-CM | POA: Diagnosis not present

## 2024-09-05 ENCOUNTER — Ambulatory Visit: Admitting: Family Medicine

## 2024-09-08 ENCOUNTER — Encounter: Payer: Self-pay | Admitting: Pharmacist

## 2024-09-08 NOTE — Progress Notes (Signed)
 Pharmacy Quality Measure Review  This patient is appearing on a report for being at risk of failing the Controlling Blood Pressure measure this calendar year.   Last documented BP  BP Readings from Last 1 Encounters:  08/17/24 (!) 155/86   2025 follow up: [facility] Future Appointments  Date Time Provider Department Center  09/18/2024  2:45 PM Lucien Orren [facility], [place] [facility]  09/25/2024  9:15 AM Brimson, Max T, DPM [facility]  10/10/2024  9:20 AM Tobie Emmy POUR, NP [facility] None   Reminder set to review BP readings in Dec for possible measure closure.

## 2024-09-11 ENCOUNTER — Other Ambulatory Visit: Payer: Self-pay | Admitting: Family

## 2024-09-11 DIAGNOSIS — I10 Essential (primary) hypertension: Secondary | ICD-10-CM

## 2024-09-12 DIAGNOSIS — J209 Acute bronchitis, unspecified: Secondary | ICD-10-CM | POA: Diagnosis not present

## 2024-09-12 DIAGNOSIS — B379 Candidiasis, unspecified: Secondary | ICD-10-CM | POA: Diagnosis not present

## 2024-09-12 DIAGNOSIS — Z8522 Personal history of malignant neoplasm of nasal cavities, middle ear, and accessory sinuses: Secondary | ICD-10-CM | POA: Diagnosis not present

## 2024-09-12 DIAGNOSIS — R059 Cough, unspecified: Secondary | ICD-10-CM | POA: Diagnosis not present

## 2024-09-14 NOTE — Telephone Encounter (Signed)
 Spoke with the patient who states she has an appointment with Orren next week on Monday to discuss test results and a plan for treatment going forward. She thanked me for calling.

## 2024-09-17 NOTE — Progress Notes (Unsigned)
 Cardiology Office Note   Date:  09/18/2024  ID:  Heather Beasley, Heather Beasley 07-14-1956, MRN [id number] PCP: Corwin Antu, FNP  [facility] Providers Cardiologist:  Lonni Cash, MD   History of Present Illness Heather Beasley is a 68 y.o. female with a past medical history of carotid artery disease status post carotid artery stenting, arthritis, depression, GERD, hyperlipidemia, HTN, IBS and prior tobacco abuse here for cardiac follow-up.  Was seen originally in consultation for shortness of breath and palpitations back in March 2023.  Was seen in the ED 12/31/2021 with complaint of dyspnea.  Chest CTA with no PE but coronary artery calcifications noted.  Known carotid artery disease and felt to be due to prior radiation therapy and had undergone carotid artery stenting at [facility] in 2021.  Echocardiogram 01/21/2022 with LVEF 70 to 75%.  Moderate asymmetric septal hypertrophy.  Mild mitral regurgitation.  Coronary CTA 02/22/2022 with mild CAD.  Sleep study in July 2023 without evidence of sleep apnea.  She stopped her amlodipine  and cut her losartan  in half due to soft blood pressure.  Stopped smoking March 2024.  Noted improvement in her dyspnea after starting Lasix  and after stopping smoking.  When she was last seen in the office October 2024 she denied chest pain, dyspnea, palpitations, lower extremity edema, orthopnea, PND, dizziness, syncope/near syncope.  Today, she presents with a history of COPD, asthma, and emphysema with concerns about fluid retention and shortness of breath.  She takes Lasix  daily for fluid retention, although it was initially prescribed 'as needed.' A recent chest X-ray showed no fluid around the heart or lungs, but her BNP is elevated at 260. She questions the necessity of daily Lasix .  She experiences shortness of breath, particularly after exertion, such as walking from the parking deck to the office. She attributes this primarily to her COPD, asthma, and  emphysema. No chest pain is present.  Her medical history includes a mild mitral valve leak noted two years ago and coronary artery disease identified on a chest CT scan. She has a history of a 97% blocked carotid artery, which was stented in 2019 due to scar tissue from radiation treatment for nasal polyp cancer.  She quit smoking and switched to vaping without nicotine  after her last cigarette on February 28 of the previous year, in preparation for back surgery.  Reports no chest pain, pressure, or tightness. No edema, orthopnea, PND. Reports no palpitations.   Discussed the use of AI scribe software for clinical note transcription with the patient, who gave verbal consent to proceed.   Today, she presents with a new diagnosis of hypertrophic cardiomyopathy who presents for management of her condition.  She has shortness of breath, chest discomfort, ankle and foot swelling, and reduced exercise tolerance. Walking to the mailbox is now difficult due to increased dyspnea.  She also has COPD, asthma, and emphysema, which may contribute to her respiratory symptoms.  Her current medication is losartan  100 mg each morning. She has episodic low blood pressure, with readings to 90/58 mmHg, and skips doses on some days based on her home readings.  She had a carotid artery stent placed in 2019 and has surveillance ultrasounds, with the most recent showing minimal thickening or plaque.  Two sons need genetic testing for hypertrophic cardiomyopathy or echo surveillance every 3-5 years which was discussed with the patient.   No orthopnea, PND. Reports no palpitations.     ROS: pertinent ROS in HPI  Studies Reviewed  Sonographer Comments: Apical HCM is present. Increased gradient with  valsalve through AV   IMPRESSIONS     1. Left ventricular ejection fraction, by estimation, is 60 to 65%. The  left ventricle has normal function. The left ventricle has no regional  wall motion  abnormalities. There is moderate left ventricular hypertrophy.  LVOT gradient 11 mm Hg at rest, up  to 52 mm Hg with valsalva. Left ventricular diastolic parameters are  consistent with Grade I diastolic dysfunction (impaired relaxation).   2. Right ventricular systolic function is normal. The right ventricular  size is normal.   3. The mitral valve is normal in structure. Mild mitral valve  regurgitation. No evidence of mitral stenosis.   4. The aortic valve is tricuspid. Aortic valve regurgitation is not  visualized. No aortic stenosis is present.   5. The inferior vena cava is normal in size with greater than 50%  respiratory variability, suggesting right atrial pressure of 3 mmHg.   FINDINGS   Left Ventricle: Left ventricular ejection fraction, by estimation, is 60  to 65%. The left ventricle has normal function. The left ventricle has no  regional wall motion abnormalities. Definity  contrast agent was given IV  to delineate the left ventricular   endocardial borders. Strain was performed and the global longitudinal  strain is indeterminate. The left ventricular internal cavity size was  normal in size. There is moderate left ventricular hypertrophy. Left  ventricular diastolic parameters are  consistent with Grade I diastolic dysfunction (impaired relaxation).   Right Ventricle: The right ventricular size is normal. No increase in  right ventricular wall thickness. Right ventricular systolic function is  normal.   Left Atrium: Left atrial size was normal in size.   Right Atrium: Right atrial size was normal in size.   Pericardium: There is no evidence of pericardial effusion.   Mitral Valve: The mitral valve is normal in structure. There is mild  calcification of the mitral valve leaflet(s). Mild mitral valve  regurgitation. No evidence of mitral valve stenosis.   Tricuspid Valve: The tricuspid valve is normal in structure. Tricuspid  valve regurgitation is not  demonstrated. No evidence of tricuspid  stenosis.   Aortic Valve: The aortic valve is tricuspid. Aortic valve regurgitation is  not visualized. No aortic stenosis is present. Aortic valve mean gradient  measures 5.0 mmHg. Aortic valve peak gradient measures 9.6 mmHg. Aortic  valve area, by VTI measures 3.02  cm.   Pulmonic Valve: The pulmonic valve was normal in structure. Pulmonic valve  regurgitation is not visualized. No evidence of pulmonic stenosis.   Aorta: The aortic root is normal in size and structure.   Venous: The inferior vena cava is normal in size with greater than 50%  respiratory variability, suggesting right atrial pressure of 3 mmHg.   IAS/Shunts: No atrial level shunt detected by color flow Doppler.   Additional Comments: 3D was performed not requiring image post processing  on an independent workstation and was indeterminate.     LEFT VENTRICLE  PLAX 2D  LVIDd:         3.88 cm   Diastology  LVIDs:         2.31 cm   LV e' medial:    3.59 cm/s  LV PW:         1.10 cm   LV E/e' medial:  21.8  LV IVS:        1.16 cm   LV e' lateral:   4.68 cm/s  LVOT  diam:     2.00 cm   LV E/e' lateral: 16.7  LV SV:         90  LV SV Index:   48  LVOT Area:     3.14 cm     RIGHT VENTRICLE  RV S prime:     17.10 cm/s   LEFT ATRIUM             Index  LA diam:        3.50 cm 1.88 cm/m  LA Vol (A2C):   68.6 ml 36.93 ml/m  LA Vol (A4C):   54.8 ml 29.50 ml/m  LA Biplane Vol: 62.5 ml 33.65 ml/m   AORTIC VALVE  AV Area (Vmax):    2.86 cm  AV Area (Vmean):   2.95 cm  AV Area (VTI):     3.02 cm  AV Vmax:           155.00 cm/s  AV Vmean:          101.000 cm/s  AV VTI:            0.298 m  AV Peak Grad:      9.6 mmHg  AV Mean Grad:      5.0 mmHg  LVOT Vmax:         141.00 cm/s  LVOT Vmean:        95.000 cm/s  LVOT VTI:          0.286 m  LVOT/AV VTI ratio: 0.96    AORTA  Ao Sinus diam: 2.83 cm  Ao Asc diam:   3.10 cm   MITRAL VALVE  MV Area (PHT): 3.23 cm      SHUNTS  MV Decel Time: 235 msec     Systemic VTI:  0.29 m  MV E velocity: 78.10 cm/s   Systemic Diam: 2.00 cm  MV A velocity: 127.00 cm/s  MV E/A ratio:  0.61   Evalene Lunger MD  Electronically signed by Evalene Lunger MD  Signature Date/Time: 08/21/2024/1:28:29 PM     Echo 01/21/22:  1. Left ventricular ejection fraction, by estimation, is 70 to 75%. The  left ventricle has hyperdynamic function. The left ventricle has no  regional wall motion abnormalities. There is moderate asymmetric left  ventricular hypertrophy of the septal  segment. Left ventricular diastolic parameters are consistent with Grade I  diastolic dysfunction (impaired relaxation).   2. Right ventricular systolic function is normal. The right ventricular  size is normal. Tricuspid regurgitation signal is inadequate for assessing  PA pressure.   3. The mitral valve is normal in structure. Mild mitral valve  regurgitation. No evidence of mitral stenosis.   4. The aortic valve is normal in structure. Aortic valve regurgitation is  not visualized. No aortic stenosis is present.   5. The inferior vena cava is normal in size with greater than 50%  respiratory variability, suggesting right atrial pressure of 3 mmHg.      Physical Exam VS:  BP (!) 130/58 (BP Location: Left Arm, Patient Position: Sitting, Cuff Size: Large)   Pulse 88   Ht 5' 4 (1.626 m)   Wt 175 lb 12.8 oz (79.7 kg)   SpO2 96%   BMI 30.18 kg/m        Wt Readings from Last 3 Encounters:  09/18/24 175 lb 12.8 oz (79.7 kg)  08/17/24 177 lb (80.3 kg)  08/08/24 177 lb (80.3 kg)    GEN: Well nourished, well developed in no acute distress NECK:  No JVD; No carotid bruits CARDIAC: RRR, SEM heard best on the LSB  , rubs, gallops RESPIRATORY:  Clear to auscultation without rales, wheezing or rhonchi  ABDOMEN: Soft, non-tender, non-distended EXTREMITIES:  No edema; No deformity   ASSESSMENT AND PLAN   Hypertrophic cardiomyopathy Genetic condition  with thickened septum confirmed by echocardiogram. Calcium  channel blockers preferred over beta blockers due to lung issues. Mavacamden considered if symptoms persist after trial of calcium  channel blocker. Discussed potential side effects of Mavacamden, including risk of atrial fibrillation and high cost, but noted patient assistance programs available. Discussed procedural options like alcohol septal ablation and septal myectomy, but not preferred at this time. - Started verapamil  120 mg extended release in the morning with plans to titrate as tolerated - Reduced losartan  to 50 mg daily, to be taken at night with plans to wean off at next visit  - Monitor blood pressure twice daily, one hour after morning and evening medications. - Order echocardiogram in three months to assess septum and symptoms. - Order CMP in eight weeks to check liver function. - Discussed genetic testing with first-degree relatives. The patient has two sons for which she will discuss options with - Consider Mavacamten if symptoms persist after trial of verapamil .  Essential hypertension Blood pressure management complicated by hypertrophic cardiomyopathy and lung issues. Verapamil  added to manage both hypertension and hypertrophic cardiomyopathy symptoms. Discussed potential for low blood pressure and need to monitor closely. - Reduced losartan  to 50 mg daily, to be taken at night with plans to wean off. - Started verapamil  120 mg extended release in the morning. - Monitor blood pressure twice daily, one hour after morning and evening medications. - Adjust medications based on blood pressure readings.  Occlusion and stenosis of left carotid artery, status post stent Status post stent placement in 2019 due to scar tissue from radiation. Recent ultrasound in August showed minimal thickening or plaque on the right and no stenosis on the left. - Continue routine follow-up with vascular specialist every two years unless symptoms  arise.    Of note, patient plans to transition to [place] out of convenience.      Dispo: She can follow-up in 3 months with me  Signed, Orren LOISE Fabry, PA-C

## 2024-09-18 ENCOUNTER — Ambulatory Visit: Attending: Physician Assistant | Admitting: Physician Assistant

## 2024-09-18 ENCOUNTER — Encounter: Payer: Self-pay | Admitting: Physician Assistant

## 2024-09-18 ENCOUNTER — Other Ambulatory Visit: Payer: Self-pay

## 2024-09-18 VITALS — BP 130/58 | HR 88 | Ht 64.0 in | Wt 175.8 lb

## 2024-09-18 DIAGNOSIS — I340 Nonrheumatic mitral (valve) insufficiency: Secondary | ICD-10-CM

## 2024-09-18 DIAGNOSIS — E7800 Pure hypercholesterolemia, unspecified: Secondary | ICD-10-CM | POA: Diagnosis not present

## 2024-09-18 DIAGNOSIS — K2100 Gastro-esophageal reflux disease with esophagitis, without bleeding: Secondary | ICD-10-CM

## 2024-09-18 DIAGNOSIS — I6522 Occlusion and stenosis of left carotid artery: Secondary | ICD-10-CM | POA: Diagnosis not present

## 2024-09-18 DIAGNOSIS — I10 Essential (primary) hypertension: Secondary | ICD-10-CM

## 2024-09-18 DIAGNOSIS — I422 Other hypertrophic cardiomyopathy: Secondary | ICD-10-CM | POA: Diagnosis not present

## 2024-09-18 DIAGNOSIS — R0602 Shortness of breath: Secondary | ICD-10-CM

## 2024-09-18 DIAGNOSIS — J301 Allergic rhinitis due to pollen: Secondary | ICD-10-CM | POA: Diagnosis not present

## 2024-09-18 DIAGNOSIS — I2510 Atherosclerotic heart disease of native coronary artery without angina pectoris: Secondary | ICD-10-CM | POA: Diagnosis not present

## 2024-09-18 MED ORDER — VERAPAMIL HCL ER 120 MG PO TBCR: 120.0000 mg | EXTENDED_RELEASE_TABLET | Freq: Every day | ORAL | 3 refills | Status: AC

## 2024-09-18 MED ORDER — LOSARTAN POTASSIUM 50 MG PO TABS: 50.0000 mg | ORAL_TABLET | Freq: Every day | ORAL | 3 refills | Status: DC

## 2024-09-18 NOTE — Patient Instructions (Addendum)
 Medication Instructions:  START Verapamil  120mg  Take 1 tablet once day  DECREASE Losartan  to 50mg  Take 1 tablet once day *If you need a refill on your cardiac medications before your next appointment, please call your pharmacy*  Lab Work: COMPLETE A CMET (11/13/2024) If you have labs (blood work) drawn today and your tests are completely normal, you will receive your results only by: MyChart Message (if you have MyChart) OR A paper copy in the mail If you have any lab test that is abnormal or we need to change your treatment, we will call you to review the results.  Testing/Procedures: PLEASE SCHEDULE ECHO IN 3 MONTHS  Your physician has requested that you have an echocardiogram. Echocardiography is a painless test that uses sound waves to create images of your heart. It provides your doctor with information about the size and shape of your heart and how well your heart's chambers and valves are working. This procedure takes approximately one hour. There are no restrictions for this procedure. Please do NOT wear cologne, perfume, aftershave, or lotions (deodorant is allowed). Please arrive 15 minutes prior to your appointment time.  Please note: We ask at that you not bring children with you during ultrasound (echo/ vascular) testing. Due to room size and safety concerns, children are not allowed in the ultrasound rooms during exams. Our front office staff cannot provide observation of children in our lobby area while testing is being conducted. An adult accompanying a patient to their appointment will only be allowed in the ultrasound room at the discretion of the ultrasound technician under special circumstances. We apologize for any inconvenience.  Follow-Up: At [facility], you and your health needs are our priority.  As part of our continuing mission to provide you with exceptional heart care, our providers are all part of one team.  This team includes your primary Cardiologist  (physician) and Advanced Practice Providers or APPs (Physician Assistants and Nurse Practitioners) who all work together to provide you with the care you need, when you need it.  Your next appointment:   3 month(s) (OR AFTER ECHO)  Provider:   Caron Poser, MD (PATIENT WANTS TO TRANSITION TO THIS OFFICE)   We recommend signing up for the patient portal called MyChart.  Sign up information is provided on this After Visit Summary.  MyChart is used to connect with patients for Virtual Visits (Telemedicine).  Patients are able to view lab/test results, encounter notes, upcoming appointments, etc.  Non-urgent messages can be sent to your provider as well.   To learn more about what you can do with MyChart, go to [email or web address].   Other Instructions Please check your blood pressure daily for two weeks, then contact the office with your readings  Please contact the office with your readings either by phone, by dropping it off in person, or by sending it through MyChart.   Be sure to check your blood pressure one to two hours after taking your medications.  Avoid the following for 30 minutes before checking your blood pressure: No caffeine No alcohol No eating No smoking  No exercise  Five minutes before checking your blood pressure: Use the restroom Sit up straight in a chair with your back supported and feet flat on the floor Remain quiet and do not talk

## 2024-09-20 ENCOUNTER — Other Ambulatory Visit (INDEPENDENT_AMBULATORY_CARE_PROVIDER_SITE_OTHER): Payer: Self-pay | Admitting: Vascular Surgery

## 2024-09-25 ENCOUNTER — Ambulatory Visit: Admitting: Podiatry

## 2024-09-25 DIAGNOSIS — Z79899 Other long term (current) drug therapy: Secondary | ICD-10-CM

## 2024-09-25 DIAGNOSIS — J301 Allergic rhinitis due to pollen: Secondary | ICD-10-CM | POA: Diagnosis not present

## 2024-09-25 DIAGNOSIS — M722 Plantar fascial fibromatosis: Secondary | ICD-10-CM | POA: Diagnosis not present

## 2024-09-25 MED ORDER — TRIAMCINOLONE ACETONIDE 40 MG/ML IJ SUSP
20.0000 mg | Freq: Once | INTRAMUSCULAR | Status: AC
  Administered 2024-09-25: 20 mg

## 2024-09-25 NOTE — Progress Notes (Signed)
 She presents today for a follow-up of her Lamisil .  States that it seems to be doing pretty well she says I think is just about all grown out.  But she is complaining of pain to her left heel.  Objective: Vital signs are stable alert and oriented x 3.  Pulses are palpable.  She has pain on palpation medial calcaneal tubercle of the left heel.  Nail plates appear to be nearly 100% grown out.  Assessment well-healing onychomycosis with long-term therapy.  Also plantar fasciitis left.  Plan: Injected her left heel today 20 mg Kenalog  5 mg of Marcaine .  Follow-up with her as needed

## 2024-09-26 NOTE — Progress Notes (Signed)
 Pharmacy Quality Measure Review  This patient is appearing on a report for being at risk of failing the adherence measure for hypertension (ACEi/ARB) medications this calendar year.   Medication: losartan  100 mg  Last fill date: 05/29/24 for 90 day supply  Per chart review, the patient is in the process of titrating off of lisinopril. She is currently taking 50 mg, half of 100 mg tablet.  Woodie Jock, PharmD PGY1 Pharmacy Resident  09/26/2024

## 2024-10-10 ENCOUNTER — Encounter: Payer: Self-pay | Admitting: Nurse Practitioner

## 2024-10-10 ENCOUNTER — Ambulatory Visit: Admitting: Nurse Practitioner

## 2024-10-10 VITALS — BP 157/83 | HR 86 | Temp 97.2°F | Resp 16 | Ht 64.0 in | Wt 177.0 lb

## 2024-10-10 DIAGNOSIS — G8929 Other chronic pain: Secondary | ICD-10-CM | POA: Diagnosis present

## 2024-10-10 DIAGNOSIS — Z79899 Other long term (current) drug therapy: Secondary | ICD-10-CM | POA: Insufficient documentation

## 2024-10-10 DIAGNOSIS — G894 Chronic pain syndrome: Secondary | ICD-10-CM

## 2024-10-10 DIAGNOSIS — M5412 Radiculopathy, cervical region: Secondary | ICD-10-CM | POA: Insufficient documentation

## 2024-10-10 DIAGNOSIS — M5416 Radiculopathy, lumbar region: Secondary | ICD-10-CM | POA: Insufficient documentation

## 2024-10-10 DIAGNOSIS — M961 Postlaminectomy syndrome, not elsewhere classified: Secondary | ICD-10-CM | POA: Insufficient documentation

## 2024-10-10 DIAGNOSIS — M792 Neuralgia and neuritis, unspecified: Secondary | ICD-10-CM

## 2024-10-10 MED ORDER — TRAMADOL HCL 50 MG PO TABS: 50.0000 mg | ORAL_TABLET | Freq: Every day | ORAL | 0 refills | Status: AC | PRN

## 2024-10-10 NOTE — Progress Notes (Signed)
 Nursing Pain Medication Assessment:  Safety precautions to be maintained throughout the outpatient stay will include: orient to surroundings, keep bed in low position, maintain call bell within reach at all times, provide assistance with transfer out of bed and ambulation.  Medication Inspection Compliance: Pill count conducted under aseptic conditions, in front of the patient. Neither the pills nor the bottle was removed from the patient's sight at any time. Once count was completed pills were immediately returned to the patient in their original bottle.  Medication: Tramadol  (Ultram ) Pill/Patch Count: 29.5 of 60 pills/patches remain Pill/Patch Appearance: Markings consistent with prescribed medication Bottle Appearance: Standard pharmacy container. Clearly labeled. Filled Date:  09 / 23 / 2025 Last Medication intake:  YesterdaySafety precautions to be maintained throughout the outpatient stay will include: orient to surroundings, keep bed in low position, maintain call bell within reach at all times, provide assistance with transfer out of bed and ambulation.

## 2024-10-10 NOTE — Progress Notes (Signed)
 PROVIDER NOTE: Interpretation of information contained herein should be left to medically-trained personnel. Specific patient instructions are provided elsewhere under Patient Instructions section of medical record. This document was created in part using AI and STT-dictation technology, any transcriptional errors that may result from this process are unintentional.  Patient: Heather Beasley  Service: E/M   PCP: Corwin Antu, FNP  DOB: 07-25-1956  DOS: 10/10/2024  Provider: Emmy MARLA Blanch, NP  MRN: [id number]  Delivery: Face-to-face  Specialty: Interventional Pain Management  Type: Established Patient  Setting: Ambulatory outpatient facility  Specialty designation: 09  Referring Prov.: Corwin Antu, FNP  Location: Outpatient office facility       History of present illness (HPI) Heather Beasley, a 68 y.o. year old female, is here today because of her Chronic pain syndrome [G89.4]. Heather Beasley primary complain today is Back Pain (upper)  Pertinent problems: Heather Beasley has Vitamin D  deficiency; GERD (gastroesophageal reflux disease); History of cancer of nasal cavity; Osteopenia; Primary osteoarthritis involving multiple joints; Lumbar radiculopathy; History of left foot drop; Lumbar spinal stenosis due to adjacent segment disease after fusion procedure (L4-S1 fusion, Left L3/4); Lumbar post-laminectomy syndrome; Sacroiliac joint pain; Lumbar adjacent segment disease with spondylolisthesis; and S/P lumbar fusion on their pertinent problem list.  Pain Assessment: Severity of Chronic pain is reported as a 3 /10. Location: Back Upper/denies. Onset: More than a month ago. Quality: Heather Beasley. Timing: Constant. Modifying factor(s): medications, lying down, rest. Vitals:  height is 5' 4 (1.626 m) and weight is 177 lb (80.3 kg). Her temporal temperature is 97.2 F (36.2 C) (abnormal). Her blood pressure is 157/83 (abnormal) and her pulse is 86. Her respiration is 16 and oxygen saturation is 98%.   BMI: Estimated body mass index is 30.38 kg/m as calculated from the following:   Height as of this encounter: 5' 4 (1.626 m).   Weight as of this encounter: 177 lb (80.3 kg).  Last encounter: 08/17/2024. Last procedure: 08/17/2024.  Reason for encounter: medication management. No change in medical history since last visit.  Patient's pain is at baseline.  Patient continues multimodal pain regimen as prescribed.  States that it provides pain relief and improvement in functional status.   Discussed the use of AI scribe software for clinical note transcription with the patient, who gave verbal consent to proceed.  History of Present Illness   Heather Beasley is a 68 year old female who presents with upper back pain.  She experiences persistent upper back pain without radiation or specific location details beyond the upper back. No other issues are reported aside from the upper back pain.  She experiences constipation, which she attributes to her new blood pressure medication, verapamil . She manages this with Miralax . No side effects from tramadol  other than constipation.     Pharmacotherapy Assessment   Tramadol  (Ultram ) 50 mg every daily at bedtime as needed for pain. MME=20 Monitoring: [place] PMP: PDMP reviewed during this encounter.       Pharmacotherapy: No side-effects or adverse reactions reported. Compliance: No problems identified. Effectiveness: Clinically acceptable.  Heather Reda CROME, RN  10/10/2024  9:11 AM  Sign when Signing Visit Nursing Pain Medication Assessment:  Safety precautions to be maintained throughout the outpatient stay will include: orient to surroundings, keep bed in low position, maintain call bell within reach at all times, provide assistance with transfer out of bed and ambulation.  Medication Inspection Compliance: Pill count conducted under aseptic conditions, in front of the patient. Neither the pills  nor the bottle was removed from the patient's sight at  any time. Once count was completed pills were immediately returned to the patient in their original bottle.  Medication: Tramadol  (Ultram ) Pill/Patch Count: 29.5 of 60 pills/patches remain Pill/Patch Appearance: Markings consistent with prescribed medication Bottle Appearance: Standard pharmacy container. Clearly labeled. Filled Date:  09 / 23 / 2025 Last Medication intake:  YesterdaySafety precautions to be maintained throughout the outpatient stay will include: orient to surroundings, keep bed in low position, maintain call bell within reach at all times, provide assistance with transfer out of bed and ambulation.     UDS:  Summary  Date Value Ref Range Status  05/15/2024 FINAL  Final    Comment:    ==================================================================== Compliance Drug Analysis, Ur ==================================================================== Test                             Result       Flag       Units  Drug Present and Declared for Prescription Verification   Desmethylcyclobenzaprine       PRESENT      EXPECTED    Desmethylcyclobenzaprine is an expected metabolite of    cyclobenzaprine .    Acetaminophen                   PRESENT      EXPECTED ==================================================================== Test                      Result    Flag   Units      Ref Range   Creatinine              65               mg/dL      >=79 ==================================================================== Declared Medications:  The flagging and interpretation on this report are based on the  following declared medications.  Unexpected results may arise from  inaccuracies in the declared medications.   **Note: The testing scope of this panel includes these medications:   Cyclobenzaprine  (Flexeril )   **Note: The testing scope of this panel does not include small to  moderate amounts of these reported medications:   Acetaminophen  (Tylenol )   **Note: The  testing scope of this panel does not include the  following reported medications:   Albuterol  (Ventolin  HFA)  Atorvastatin  (Lipitor)  Budesonide (Breztri  Aerosphere)  Cephalexin  (Keflex )  Cetirizine (Zyrtec)  Clopidogrel  (Plavix )  Epinephrine  (EpiPen )  Formoterol  (Breztri  Aerosphere)  Furosemide  (Lasix )  Glycopyrrolate  (Breztri  Aerosphere)  Losartan  (Cozaar )  Montelukast  (Singulair )  Multivitamin  Oxymetazoline  (Afrin)  Pantoprazole  (Protonix )  Terbinafine  (Lamisil )  Vitamin D  ==================================================================== For clinical consultation, please call [phone]. ====================================================================     No results found for: CBDTHCR No results found for: D8THCCBX No results found for: D9THCCBX  ROS  Constitutional: Denies any fever or chills Gastrointestinal: No reported hemesis, hematochezia, vomiting, or acute GI distress Musculoskeletal: Upper back pain Neurological: No reported episodes of acute onset apraxia, aphasia, dysarthria, agnosia, amnesia, paralysis, loss of coordination, or loss of consciousness  Medication Review  DULoxetine , EPINEPHrine , Multiple Vitamins-Minerals, Vitamin D , acetaminophen , albuterol , atorvastatin , benzonatate , budesonide-glycopyrrolate -formoterol , cetirizine, clopidogrel , cyclobenzaprine , furosemide , losartan , montelukast , oxymetazoline , pantoprazole , traMADol , and verapamil   History Review  Allergy: Heather Beasley is allergic to albumin (human), amoxicillin-pot clavulanate, gabapentin, latex, betadine [povidone iodine ], sucralfate, amoxicillin, chantix [varenicline tartrate], erythromycin, sucralfate, and tegaderm ag mesh [silver]. Drug: Heather Beasley  reports no history of drug use.  Alcohol:  reports that she does not currently use alcohol. Tobacco:  reports that she has been smoking e-cigarettes. She has never used smokeless tobacco. Social: Heather Beasley  reports that she  has been smoking e-cigarettes. She has never used smokeless tobacco. She reports that she does not currently use alcohol. She reports that she does not use drugs. Medical:  has a past medical history of Allergic rhinitis, Aortic atherosclerosis, Arthritis, Asthma, Bilateral carotid artery disease, Cancer (HCC), Cancer of nasal cavity and sinus (HCC) (07/07/2015), Cervical spondylosis, Colitis, Colon polyps, COPD (chronic obstructive pulmonary disease) (HCC), Coronary artery disease, DDD (degenerative disc disease), cervical, Depression, Diastolic dysfunction, Diverticulosis, GERD (gastroesophageal reflux disease), Glaucoma, Headaches, cluster, Heart murmur, HTN (hypertension), Hyperlipidemia, IBS (irritable bowel syndrome), Kidney stones, Long term current use of antithrombotics/antiplatelets, Lumbar adjacent segment disease with spondylolisthesis, Melanoma (HCC) (1995), Neuropathy, Neuropathy of left lower extremity, Nose colonized with MRSA (02/16/2023), PFO (patent foramen ovale), Pneumonia, PONV (postoperative nausea and vomiting), and Tobacco abuse. Surgical: Heather Beasley  has a past surgical history that includes Appendectomy (1998); Abdominal hysterectomy (1998); Lumbar spine surgery (07/2004); Vaginal delivery; Cervical fusion (2010); Cholecystectomy (08/2011); Esophagogastroduodenoscopy (2012); Colonoscopy (N/A); Tonsillectomy (1968); Cystoscopy (N/A); Back surgery; Dilation and curettage of uterus; Fracture surgery (2001); Image guided sinus surgery (N/A, 06/25/2015); Polypectomy (N/A, 06/25/2015); Oophorectomy; CAROTID PTA/STENT INTERVENTION (Left, 06/27/2020); Cataract extraction; Anterior lumbar fusion (N/A, 03/01/2023); and Application of intraoperative CT scan (N/A, 03/01/2023). Family: family history includes Breast cancer (age of onset: 53) in her paternal grandmother; Cancer in her father, mother, and paternal grandmother; Colon cancer in her father; Coronary artery disease in her paternal  grandmother; Diabetes in her mother and paternal grandmother; Heart failure in her paternal grandmother; Hyperlipidemia in her mother; Hypertension in her mother; Pancreatic cancer in her father; Stroke in her brother.  Laboratory Chemistry Profile   Renal Lab Results  Component Value Date   BUN 13 06/21/2024   CREATININE 0.95 06/21/2024   BCR 16 01/12/2023   GFR 62.65 06/21/2024   GFRAA >60 06/28/2020   GFRNONAA >60 12/31/2021    Hepatic Lab Results  Component Value Date   AST 20 06/21/2024   ALT 13 06/21/2024   ALBUMIN 4.2 06/21/2024   ALKPHOS 83 06/21/2024   LIPASE 16 01/04/2012    Electrolytes Lab Results  Component Value Date   NA 143 06/21/2024   K 4.6 06/21/2024   CL 103 06/21/2024   CALCIUM  9.4 06/21/2024   MG 1.8 06/21/2024    Bone Lab Results  Component Value Date   VD25OH 43.63 08/25/2023    Inflammation (CRP: Acute Phase) (ESR: Chronic Phase) Lab Results  Component Value Date   ESRSEDRATE 2 11/17/2012   LATICACIDVEN 1.0 06/11/2011         Note: Above Lab results reviewed.  Recent Imaging Review  ECHOCARDIOGRAM COMPLETE    ECHOCARDIOGRAM REPORT       Patient Name:   KADESHIA KASPARIAN Date of Exam: 08/21/2024 Medical Rec #:  [id number]       Height:       64.0 in Accession #:    [id number]      Weight:       177.0 lb Date of Birth:  Mar 06, 1956       BSA:          1.857 m Patient Age:    68 years        BP:           136/82 mmHg Patient Gender: F  HR:           86 bpm. Exam Location:  [facility]  Procedure: 2D Echo, Color Doppler, Cardiac Doppler and Intracardiac            Opacification Agent (Both Spectral and Color Flow Doppler were            utilized during procedure).  Indications:    I34.0 Nonrheumatic mitral (valve) insufficiency   History:        Patient has prior history of Echocardiogram examinations, most                 recent 01/21/2022. CAD, Carotid Disease and COPD,                 Signs/Symptoms:Shortness of Breath,  Dyspnea and Murmur; Risk                 Factors:Hypertension, Dyslipidemia and Current Smoker.   Sonographer:    [place]oyal [place] MHA, BS, RDCS Referring Phys: 29 TESSA N CONTE    Sonographer Comments: Apical HCM is present. Increased gradient with valsalve through AV IMPRESSIONS   1. Left ventricular ejection fraction, by estimation, is 60 to 65%. The left ventricle has normal function. The left ventricle has no regional wall motion abnormalities. There is moderate left ventricular hypertrophy. LVOT gradient 11 mm Hg at rest, up  to 52 mm Hg with valsalva. Left ventricular diastolic parameters are consistent with Grade I diastolic dysfunction (impaired relaxation).  2. Right ventricular systolic function is normal. The right ventricular size is normal.  3. The mitral valve is normal in structure. Mild mitral valve regurgitation. No evidence of mitral stenosis.  4. The aortic valve is tricuspid. Aortic valve regurgitation is not visualized. No aortic stenosis is present.  5. The inferior vena cava is normal in size with greater than 50% respiratory variability, suggesting right atrial pressure of 3 mmHg.  FINDINGS  Left Ventricle: Left ventricular ejection fraction, by estimation, is 60 to 65%. The left ventricle has normal function. The left ventricle has no regional wall motion abnormalities. Definity  contrast agent was given IV to delineate the left ventricular  endocardial borders. Strain was performed and the global longitudinal strain is indeterminate. The left ventricular internal cavity size was normal in size. There is moderate left ventricular hypertrophy. Left ventricular diastolic parameters are  consistent with Grade I diastolic dysfunction (impaired relaxation).  Right Ventricle: The right ventricular size is normal. No increase in right ventricular wall thickness. Right ventricular systolic function is normal.  Left Atrium: Left atrial size was normal in size.  Right  Atrium: Right atrial size was normal in size.  Pericardium: There is no evidence of pericardial effusion.  Mitral Valve: The mitral valve is normal in structure. There is mild calcification of the mitral valve leaflet(s). Mild mitral valve regurgitation. No evidence of mitral valve stenosis.  Tricuspid Valve: The tricuspid valve is normal in structure. Tricuspid valve regurgitation is not demonstrated. No evidence of tricuspid stenosis.  Aortic Valve: The aortic valve is tricuspid. Aortic valve regurgitation is not visualized. No aortic stenosis is present. Aortic valve mean gradient measures 5.0 mmHg. Aortic valve peak gradient measures 9.6 mmHg. Aortic valve area, by VTI measures 3.02  cm.  Pulmonic Valve: The pulmonic valve was normal in structure. Pulmonic valve regurgitation is not visualized. No evidence of pulmonic stenosis.  Aorta: The aortic root is normal in size and structure.  Venous: The inferior vena cava is normal in size with greater than 50% respiratory  variability, suggesting right atrial pressure of 3 mmHg.  IAS/Shunts: No atrial level shunt detected by color flow Doppler.  Additional Comments: 3D was performed not requiring image post processing on an independent workstation and was indeterminate.    LEFT VENTRICLE PLAX 2D LVIDd:         3.88 cm   Diastology LVIDs:         2.31 cm   LV e' medial:    3.59 cm/s LV PW:         1.10 cm   LV E/e' medial:  21.8 LV IVS:        1.16 cm   LV e' lateral:   4.68 cm/s LVOT diam:     2.00 cm   LV E/e' lateral: 16.7 LV SV:         90 LV SV Index:   48 LVOT Area:     3.14 cm    RIGHT VENTRICLE RV S prime:     17.10 cm/s  LEFT ATRIUM             Index LA diam:        3.50 cm 1.88 cm/m LA Vol (A2C):   68.6 ml 36.93 ml/m LA Vol (A4C):   54.8 ml 29.50 ml/m LA Biplane Vol: 62.5 ml 33.65 ml/m  AORTIC VALVE AV Area (Vmax):    2.86 cm AV Area (Vmean):   2.95 cm AV Area (VTI):     3.02 cm AV Vmax:           155.00  cm/s AV Vmean:          101.000 cm/s AV VTI:            0.298 m AV Peak Grad:      9.6 mmHg AV Mean Grad:      5.0 mmHg LVOT Vmax:         141.00 cm/s LVOT Vmean:        95.000 cm/s LVOT VTI:          0.286 m LVOT/AV VTI ratio: 0.96   AORTA Ao Sinus diam: 2.83 cm Ao Asc diam:   3.10 cm  MITRAL VALVE MV Area (PHT): 3.23 cm     SHUNTS MV Decel Time: 235 msec     Systemic VTI:  0.29 m MV E velocity: 78.10 cm/s   Systemic Diam: 2.00 cm MV A velocity: 127.00 cm/s MV E/A ratio:  0.61  Heather Lunger MD Electronically signed by Heather Lunger MD Signature Date/Time: 08/21/2024/1:28:29 PM      Final   Note: Reviewed        Physical Exam  Vitals: BP (!) 157/83 (Cuff Size: Normal)   Pulse 86   Temp (!) 97.2 F (36.2 C) (Temporal)   Resp 16   Ht 5' 4 (1.626 m)   Wt 177 lb (80.3 kg)   SpO2 98%   BMI 30.38 kg/m  BMI: Estimated body mass index is 30.38 kg/m as calculated from the following:   Height as of this encounter: 5' 4 (1.626 m).   Weight as of this encounter: 177 lb (80.3 kg). Ideal: Ideal body weight: 54.7 kg (120 lb 9.5 oz) Adjusted ideal body weight: 64.9 kg (143 lb 2.5 oz) General appearance: Well nourished, well developed, and well hydrated. In no apparent acute distress Mental status: Alert, oriented x 3 (person, place, & time)       Respiratory: No evidence of acute respiratory distress Eyes: PERLA  Musculoskeletal: + Upper back pain  Assessment   Diagnosis Status  1. Chronic pain syndrome   2. Medication management   3. Cervical radicular pain   4. Chronic radicular lumbar pain   5. Radicular pain in right arm   6. Lumbar post-laminectomy syndrome    Controlled Controlled Controlled   Updated Problems: No problems updated.  Plan of Care  Problem-specific:  Assessment and Plan    Chronic pain syndrome Persistent upper back pain with no new issues. Constipation likely due to verapamil . - Continue tramadol  for pain management. - Advised  increased water  intake and fiber consumption. - Continue Miralax  for constipation management.  Medication management Constipation as a side effect of verapamil . - Continue verapamil  for blood pressure management. - Advised increased water  intake and fiber consumption. - Continue Miralax  for constipation management.  Patient's pain is controlled with tramadol , will continue on current medication regimen.  Prescribing drug monitoring (PDMP) reviewed, findings consistent with the use of prescribed medication and no evidence of narcotic misuse or abuse.  Urine drug screening (UDS) up to date.  The patient was advised to increase water  intake and fiber consumption to prevent opioid-induced constipation.  Schedule follow-up in 90 days for medication management.     Ms. SHYNIECE SCRIPTER has a current medication list which includes the following long-term medication(s): albuterol , atorvastatin , duloxetine , furosemide , montelukast , pantoprazole , verapamil , and losartan .  Pharmacotherapy (Medications Ordered): Meds ordered this encounter  Medications   traMADol  (ULTRAM ) 50 MG tablet    Sig: Take 1 tablet (50 mg total) by mouth daily as needed for severe pain (pain score 7-10).    Dispense:  30 tablet    Refill:  0    Fill one day early if pharmacy is closed on scheduled refill date. Do not fill until: To last until:   traMADol  (ULTRAM ) 50 MG tablet    Sig: Take 1 tablet (50 mg total) by mouth daily as needed for severe pain (pain score 7-10).    Dispense:  30 tablet    Refill:  0    Fill one day early if pharmacy is closed on scheduled refill date. Do not fill until: To last until:   Orders:  No orders of the defined types were placed in this encounter.       Return in about 3 months (around 01/08/2025) for (F2F), (MM), Emmy Blanch NP.    Recent Visits Date Type Provider Dept  08/17/24 Office Visit Anberlin Diez K, NP [facility]  07/18/24 Office Visit Icarus Partch K, NP [facility]  Showing recent visits within past 90 days and meeting all other requirements Today's Visits Date Type Provider Dept  10/10/24 Office Visit Shrika Milos K, NP [facility]  Showing today's visits and meeting all other requirements Future Appointments Date Type Provider Dept  01/01/25 Appointment Shavy Beachem K, NP [facility]  Showing future appointments within next 90 days and meeting all other requirements  I discussed the assessment and treatment plan with the patient. The patient was provided an opportunity to ask questions and all were answered. The patient agreed with the plan and demonstrated an understanding of the instructions.  Patient advised to call back or seek an in-person evaluation if the symptoms or condition worsens.  I personally spent a total of 30 minutes in the care of the patient today including preparing to see the patient, getting/reviewing separately obtained history, performing a medically appropriate exam/evaluation, counseling and educating, placing orders, referring and communicating with other health care professionals,  documenting clinical information in the EHR, independently interpreting results, communicating results, and coordinating care.   Note by: Lyly Canizales K Graysyn Bache, NP (TTS and AI technology used. I apologize for any typographical errors that were not detected and corrected.) Date: 10/10/2024; Time: 10:14 AM

## 2024-11-13 LAB — COMPREHENSIVE METABOLIC PANEL WITH GFR
ALT: 13 IU/L (ref 0–32)
AST: 23 IU/L (ref 0–40)
Albumin: 4 g/dL (ref 3.9–4.9)
Alkaline Phosphatase: 112 IU/L (ref 49–135)
BUN/Creatinine Ratio: 16 (ref 12–28)
BUN: 15 mg/dL (ref 8–27)
Bilirubin Total: 1 mg/dL (ref 0.0–1.2)
CO2: 23 mmol/L (ref 20–29)
Calcium: 9.5 mg/dL (ref 8.7–10.3)
Chloride: 102 mmol/L (ref 96–106)
Creatinine, Ser: 0.96 mg/dL (ref 0.57–1.00)
Globulin, Total: 2.5 g/dL (ref 1.5–4.5)
Glucose: 89 mg/dL (ref 70–99)
Potassium: 4.6 mmol/L (ref 3.5–5.2)
Sodium: 142 mmol/L (ref 134–144)
Total Protein: 6.5 g/dL (ref 6.0–8.5)
eGFR: 65 mL/min/1.73

## 2024-11-14 ENCOUNTER — Other Ambulatory Visit: Payer: Self-pay | Admitting: Physician Assistant

## 2024-11-14 ENCOUNTER — Ambulatory Visit: Payer: Self-pay | Admitting: Physician Assistant

## 2024-11-29 ENCOUNTER — Encounter: Payer: Self-pay | Admitting: Acute Care

## 2024-12-19 ENCOUNTER — Ambulatory Visit

## 2025-01-01 ENCOUNTER — Encounter: Admitting: Nurse Practitioner

## 2025-03-16 ENCOUNTER — Ambulatory Visit

## 2025-05-17 ENCOUNTER — Ambulatory Visit: Admitting: Dermatology
